# Patient Record
Sex: Female | Born: 1945 | Race: White | Hispanic: No | Marital: Married | Smoking: Never smoker
Health system: Southern US, Community
[De-identification: ages and names within clinical notes are randomized; demographics above are authoritative.]

## PROBLEM LIST (undated history)

## (undated) DIAGNOSIS — I469 Cardiac arrest, cause unspecified: Secondary | ICD-10-CM

## (undated) DIAGNOSIS — Q231 Congenital insufficiency of aortic valve: Secondary | ICD-10-CM

## (undated) DIAGNOSIS — I10 Essential (primary) hypertension: Secondary | ICD-10-CM

## (undated) DIAGNOSIS — R0989 Other specified symptoms and signs involving the circulatory and respiratory systems: Secondary | ICD-10-CM

## (undated) DIAGNOSIS — J189 Pneumonia, unspecified organism: Secondary | ICD-10-CM

## (undated) DIAGNOSIS — F419 Anxiety disorder, unspecified: Secondary | ICD-10-CM

## (undated) DIAGNOSIS — M4800 Spinal stenosis, site unspecified: Secondary | ICD-10-CM

## (undated) DIAGNOSIS — K649 Unspecified hemorrhoids: Secondary | ICD-10-CM

## (undated) DIAGNOSIS — M1990 Unspecified osteoarthritis, unspecified site: Secondary | ICD-10-CM

## (undated) DIAGNOSIS — R0600 Dyspnea, unspecified: Secondary | ICD-10-CM

## (undated) DIAGNOSIS — I712 Thoracic aortic aneurysm, without rupture, unspecified: Secondary | ICD-10-CM

## (undated) DIAGNOSIS — R4189 Other symptoms and signs involving cognitive functions and awareness: Secondary | ICD-10-CM

## (undated) DIAGNOSIS — R4781 Slurred speech: Secondary | ICD-10-CM

## (undated) DIAGNOSIS — M719 Bursopathy, unspecified: Secondary | ICD-10-CM

## (undated) DIAGNOSIS — F0390 Unspecified dementia without behavioral disturbance: Secondary | ICD-10-CM

## (undated) DIAGNOSIS — H269 Unspecified cataract: Secondary | ICD-10-CM

## (undated) DIAGNOSIS — K219 Gastro-esophageal reflux disease without esophagitis: Secondary | ICD-10-CM

## (undated) DIAGNOSIS — Z8719 Personal history of other diseases of the digestive system: Secondary | ICD-10-CM

## (undated) DIAGNOSIS — E785 Hyperlipidemia, unspecified: Secondary | ICD-10-CM

## (undated) DIAGNOSIS — I499 Cardiac arrhythmia, unspecified: Secondary | ICD-10-CM

## (undated) DIAGNOSIS — E079 Disorder of thyroid, unspecified: Secondary | ICD-10-CM

## (undated) DIAGNOSIS — E039 Hypothyroidism, unspecified: Secondary | ICD-10-CM

## (undated) DIAGNOSIS — F341 Dysthymic disorder: Secondary | ICD-10-CM

## (undated) DIAGNOSIS — M419 Scoliosis, unspecified: Secondary | ICD-10-CM

## (undated) HISTORY — PX: HAND SURGERY: SHX662

## (undated) HISTORY — PX: SINUS IRRIGATION: SHX2411

## (undated) HISTORY — PX: FOOT SURGERY: SHX648

## (undated) HISTORY — DX: Slurred speech: R47.81

## (undated) HISTORY — PX: BUNIONECTOMY: SHX129

## (undated) HISTORY — DX: Thoracic aortic aneurysm, without rupture: I71.2

## (undated) HISTORY — DX: Unspecified cataract: H26.9

## (undated) HISTORY — DX: Thoracic aortic aneurysm, without rupture, unspecified: I71.20

## (undated) HISTORY — DX: Other specified symptoms and signs involving the circulatory and respiratory systems: R09.89

## (undated) HISTORY — DX: Spinal stenosis, site unspecified: M48.00

## (undated) HISTORY — DX: Disorder of thyroid, unspecified: E07.9

## (undated) HISTORY — DX: Unspecified osteoarthritis, unspecified site: M19.90

## (undated) HISTORY — PX: ABDOMINAL HYSTERECTOMY: SHX81

## (undated) HISTORY — DX: Dysthymic disorder: F34.1

## (undated) HISTORY — PX: BREAST EXCISIONAL BIOPSY: SUR124

## (undated) HISTORY — DX: Scoliosis, unspecified: M41.9

## (undated) HISTORY — PX: TONSILLECTOMY AND ADENOIDECTOMY: SUR1326

## (undated) HISTORY — PX: OTHER SURGICAL HISTORY: SHX169

## (undated) HISTORY — DX: Hyperlipidemia, unspecified: E78.5

## (undated) HISTORY — DX: Unspecified hemorrhoids: K64.9

## (undated) HISTORY — DX: Personal history of other diseases of the digestive system: Z87.19

## (undated) HISTORY — DX: Congenital insufficiency of aortic valve: Q23.1

## (undated) HISTORY — PX: TRANSTHORACIC ECHOCARDIOGRAM: SHX275

## (undated) HISTORY — DX: Essential (primary) hypertension: I10

---

## 1998-07-20 ENCOUNTER — Other Ambulatory Visit: Admission: RE | Admit: 1998-07-20 | Discharge: 1998-07-20 | Payer: Self-pay | Admitting: Obstetrics and Gynecology

## 1999-08-07 ENCOUNTER — Other Ambulatory Visit: Admission: RE | Admit: 1999-08-07 | Discharge: 1999-08-07 | Payer: Self-pay | Admitting: Obstetrics and Gynecology

## 2000-09-10 ENCOUNTER — Other Ambulatory Visit: Admission: RE | Admit: 2000-09-10 | Discharge: 2000-09-10 | Payer: Self-pay | Admitting: Obstetrics and Gynecology

## 2001-10-07 ENCOUNTER — Other Ambulatory Visit: Admission: RE | Admit: 2001-10-07 | Discharge: 2001-10-07 | Payer: Self-pay | Admitting: Obstetrics and Gynecology

## 2002-12-16 ENCOUNTER — Other Ambulatory Visit: Admission: RE | Admit: 2002-12-16 | Discharge: 2002-12-16 | Payer: Self-pay | Admitting: Obstetrics & Gynecology

## 2003-11-11 ENCOUNTER — Ambulatory Visit (HOSPITAL_COMMUNITY): Admission: RE | Admit: 2003-11-11 | Discharge: 2003-11-11 | Payer: Self-pay | Admitting: Urology

## 2003-11-11 ENCOUNTER — Encounter (INDEPENDENT_AMBULATORY_CARE_PROVIDER_SITE_OTHER): Payer: Self-pay | Admitting: *Deleted

## 2003-11-11 ENCOUNTER — Ambulatory Visit (HOSPITAL_BASED_OUTPATIENT_CLINIC_OR_DEPARTMENT_OTHER): Admission: RE | Admit: 2003-11-11 | Discharge: 2003-11-11 | Payer: Self-pay | Admitting: Urology

## 2004-02-06 ENCOUNTER — Other Ambulatory Visit: Admission: RE | Admit: 2004-02-06 | Discharge: 2004-02-06 | Payer: Self-pay | Admitting: Obstetrics and Gynecology

## 2004-10-03 ENCOUNTER — Ambulatory Visit: Payer: Self-pay | Admitting: Internal Medicine

## 2005-01-10 ENCOUNTER — Ambulatory Visit: Payer: Self-pay | Admitting: Internal Medicine

## 2005-01-21 ENCOUNTER — Ambulatory Visit: Payer: Self-pay | Admitting: Gastroenterology

## 2005-02-04 ENCOUNTER — Ambulatory Visit: Payer: Self-pay | Admitting: Gastroenterology

## 2005-02-04 HISTORY — PX: COLONOSCOPY: SHX174

## 2005-02-21 ENCOUNTER — Ambulatory Visit: Payer: Self-pay | Admitting: Internal Medicine

## 2005-03-01 ENCOUNTER — Other Ambulatory Visit: Admission: RE | Admit: 2005-03-01 | Discharge: 2005-03-01 | Payer: Self-pay | Admitting: Obstetrics and Gynecology

## 2005-04-10 ENCOUNTER — Ambulatory Visit: Payer: Self-pay | Admitting: Family Medicine

## 2005-04-25 ENCOUNTER — Ambulatory Visit: Payer: Self-pay | Admitting: Internal Medicine

## 2005-06-06 ENCOUNTER — Ambulatory Visit: Payer: Self-pay | Admitting: Internal Medicine

## 2005-06-14 ENCOUNTER — Encounter: Admission: RE | Admit: 2005-06-14 | Discharge: 2005-06-14 | Payer: Self-pay | Admitting: Internal Medicine

## 2005-08-06 ENCOUNTER — Ambulatory Visit: Payer: Self-pay | Admitting: Internal Medicine

## 2005-10-15 ENCOUNTER — Encounter: Admission: RE | Admit: 2005-10-15 | Discharge: 2005-10-15 | Payer: Self-pay | Admitting: Neurosurgery

## 2005-10-30 ENCOUNTER — Encounter: Admission: RE | Admit: 2005-10-30 | Discharge: 2006-01-07 | Payer: Self-pay | Admitting: Neurosurgery

## 2005-11-12 ENCOUNTER — Ambulatory Visit: Payer: Self-pay | Admitting: Internal Medicine

## 2005-12-17 ENCOUNTER — Encounter: Admission: RE | Admit: 2005-12-17 | Discharge: 2005-12-17 | Payer: Self-pay | Admitting: Neurosurgery

## 2006-01-10 ENCOUNTER — Encounter: Payer: Self-pay | Admitting: Internal Medicine

## 2006-04-08 ENCOUNTER — Ambulatory Visit: Payer: Self-pay | Admitting: Internal Medicine

## 2006-06-03 ENCOUNTER — Ambulatory Visit: Payer: Self-pay | Admitting: Internal Medicine

## 2006-07-29 ENCOUNTER — Ambulatory Visit: Payer: Self-pay | Admitting: Internal Medicine

## 2006-08-19 ENCOUNTER — Ambulatory Visit: Payer: Self-pay | Admitting: Internal Medicine

## 2006-11-19 ENCOUNTER — Ambulatory Visit: Payer: Self-pay | Admitting: Internal Medicine

## 2006-11-19 LAB — CONVERTED CEMR LAB
ALT: 24 units/L (ref 0–40)
Basophils Absolute: 0 10*3/uL (ref 0.0–0.1)
CO2: 32 meq/L (ref 19–32)
Chloride: 104 meq/L (ref 96–112)
Cholesterol: 220 mg/dL (ref 0–200)
Creatinine, Ser: 1 mg/dL (ref 0.4–1.2)
GFR calc Af Amer: 72 mL/min
Glucose, Bld: 95 mg/dL (ref 70–99)
HDL: 55.9 mg/dL (ref >39.0)
Lymphocytes Relative: 27.4 % (ref 12.0–46.0)
MCHC: 35 g/dL (ref 30.0–36.0)
MCV: 93.2 fL (ref 78.0–100.0)
Monocytes Absolute: 0.6 10*3/uL (ref 0.2–0.7)
Monocytes Relative: 10.7 % (ref 3.0–11.0)
Neutro Abs: 3 10*3/uL (ref 1.4–7.7)
Platelets: 234 10*3/uL (ref 150–400)
RBC: 4.41 M/uL (ref 3.87–5.11)
Sodium: 144 meq/L (ref 135–145)
TSH: 2.33 microintl units/mL (ref 0.35–5.50)
Total CHOL/HDL Ratio: 3.9
Triglycerides: 78 mg/dL (ref 0–149)
VLDL: 16 mg/dL (ref 0–40)

## 2006-11-26 ENCOUNTER — Ambulatory Visit: Payer: Self-pay | Admitting: Internal Medicine

## 2007-01-01 ENCOUNTER — Ambulatory Visit: Payer: Self-pay | Admitting: Internal Medicine

## 2007-02-03 ENCOUNTER — Ambulatory Visit: Payer: Self-pay | Admitting: Internal Medicine

## 2007-03-13 DIAGNOSIS — E039 Hypothyroidism, unspecified: Secondary | ICD-10-CM | POA: Insufficient documentation

## 2007-03-13 DIAGNOSIS — J45909 Unspecified asthma, uncomplicated: Secondary | ICD-10-CM | POA: Insufficient documentation

## 2007-03-13 DIAGNOSIS — M797 Fibromyalgia: Secondary | ICD-10-CM | POA: Insufficient documentation

## 2007-03-17 ENCOUNTER — Ambulatory Visit: Payer: Self-pay | Admitting: Internal Medicine

## 2007-03-17 LAB — CONVERTED CEMR LAB
T3, Free: 3.3 pg/mL (ref 2.3–4.2)
TSH: 0.72 microintl units/mL (ref 0.35–5.50)

## 2007-04-21 ENCOUNTER — Ambulatory Visit: Payer: Self-pay | Admitting: Internal Medicine

## 2007-05-21 ENCOUNTER — Telehealth: Payer: Self-pay | Admitting: *Deleted

## 2007-06-08 ENCOUNTER — Encounter: Payer: Self-pay | Admitting: Internal Medicine

## 2007-07-14 ENCOUNTER — Ambulatory Visit: Payer: Self-pay | Admitting: Internal Medicine

## 2007-08-20 ENCOUNTER — Ambulatory Visit: Payer: Self-pay | Admitting: Internal Medicine

## 2007-09-21 ENCOUNTER — Encounter: Payer: Self-pay | Admitting: Internal Medicine

## 2007-09-29 ENCOUNTER — Telehealth (INDEPENDENT_AMBULATORY_CARE_PROVIDER_SITE_OTHER): Payer: Self-pay | Admitting: *Deleted

## 2007-11-09 ENCOUNTER — Encounter: Payer: Self-pay | Admitting: Internal Medicine

## 2007-11-19 ENCOUNTER — Ambulatory Visit: Payer: Self-pay | Admitting: Internal Medicine

## 2007-11-19 LAB — CONVERTED CEMR LAB
Free T4: 0.5 ng/dL — ABNORMAL LOW (ref 0.6–1.6)
TSH: 1.01 microintl units/mL (ref 0.35–5.50)
Vit D, 1,25-Dihydroxy: 37 (ref 30–89)

## 2007-11-23 ENCOUNTER — Telehealth: Payer: Self-pay | Admitting: Internal Medicine

## 2007-12-17 ENCOUNTER — Encounter: Payer: Self-pay | Admitting: Internal Medicine

## 2007-12-18 ENCOUNTER — Telehealth: Payer: Self-pay | Admitting: *Deleted

## 2007-12-21 ENCOUNTER — Encounter: Payer: Self-pay | Admitting: Internal Medicine

## 2007-12-30 ENCOUNTER — Telehealth: Payer: Self-pay | Admitting: Internal Medicine

## 2008-01-01 ENCOUNTER — Telehealth: Payer: Self-pay | Admitting: Internal Medicine

## 2008-01-19 ENCOUNTER — Ambulatory Visit: Payer: Self-pay | Admitting: Internal Medicine

## 2008-01-19 DIAGNOSIS — M5030 Other cervical disc degeneration, unspecified cervical region: Secondary | ICD-10-CM | POA: Insufficient documentation

## 2008-01-19 DIAGNOSIS — M1990 Unspecified osteoarthritis, unspecified site: Secondary | ICD-10-CM | POA: Insufficient documentation

## 2008-01-19 DIAGNOSIS — F324 Major depressive disorder, single episode, in partial remission: Secondary | ICD-10-CM | POA: Insufficient documentation

## 2008-01-19 DIAGNOSIS — G43109 Migraine with aura, not intractable, without status migrainosus: Secondary | ICD-10-CM | POA: Insufficient documentation

## 2008-01-21 ENCOUNTER — Telehealth: Payer: Self-pay | Admitting: *Deleted

## 2008-01-23 ENCOUNTER — Encounter: Admission: RE | Admit: 2008-01-23 | Discharge: 2008-01-23 | Payer: Self-pay | Admitting: Internal Medicine

## 2008-02-03 ENCOUNTER — Telehealth (INDEPENDENT_AMBULATORY_CARE_PROVIDER_SITE_OTHER): Payer: Self-pay | Admitting: *Deleted

## 2008-02-16 ENCOUNTER — Encounter: Payer: Self-pay | Admitting: Internal Medicine

## 2008-02-19 ENCOUNTER — Telehealth (INDEPENDENT_AMBULATORY_CARE_PROVIDER_SITE_OTHER): Payer: Self-pay | Admitting: *Deleted

## 2008-03-18 ENCOUNTER — Ambulatory Visit: Payer: Self-pay | Admitting: Internal Medicine

## 2008-04-21 ENCOUNTER — Ambulatory Visit: Payer: Self-pay | Admitting: Internal Medicine

## 2008-04-21 DIAGNOSIS — R5382 Chronic fatigue, unspecified: Secondary | ICD-10-CM | POA: Insufficient documentation

## 2008-04-21 DIAGNOSIS — G9332 Myalgic encephalomyelitis/chronic fatigue syndrome: Secondary | ICD-10-CM | POA: Insufficient documentation

## 2008-05-03 ENCOUNTER — Ambulatory Visit (HOSPITAL_COMMUNITY): Admission: RE | Admit: 2008-05-03 | Discharge: 2008-05-03 | Payer: Self-pay | Admitting: Neurosurgery

## 2008-05-04 ENCOUNTER — Encounter: Payer: Self-pay | Admitting: Internal Medicine

## 2008-05-26 ENCOUNTER — Ambulatory Visit: Payer: Self-pay | Admitting: Internal Medicine

## 2008-05-27 ENCOUNTER — Encounter: Payer: Self-pay | Admitting: Internal Medicine

## 2008-06-07 ENCOUNTER — Telehealth: Payer: Self-pay | Admitting: *Deleted

## 2008-06-14 ENCOUNTER — Telehealth: Payer: Self-pay | Admitting: Internal Medicine

## 2008-06-23 ENCOUNTER — Ambulatory Visit: Payer: Self-pay | Admitting: Internal Medicine

## 2008-06-27 ENCOUNTER — Encounter: Payer: Self-pay | Admitting: Internal Medicine

## 2008-07-29 ENCOUNTER — Ambulatory Visit: Payer: Self-pay | Admitting: Internal Medicine

## 2008-07-29 DIAGNOSIS — Q742 Other congenital malformations of lower limb(s), including pelvic girdle: Secondary | ICD-10-CM | POA: Insufficient documentation

## 2008-07-29 LAB — CONVERTED CEMR LAB
Calcium: 8.9 mg/dL (ref 8.4–10.5)
Free T4: 1 ng/dL (ref 0.6–1.6)
T3, Free: 7 pg/mL — ABNORMAL HIGH (ref 2.3–4.2)
TSH: 0.1 microintl units/mL — ABNORMAL LOW (ref 0.35–5.50)

## 2008-08-02 ENCOUNTER — Ambulatory Visit (HOSPITAL_BASED_OUTPATIENT_CLINIC_OR_DEPARTMENT_OTHER): Admission: RE | Admit: 2008-08-02 | Discharge: 2008-08-03 | Payer: Self-pay | Admitting: Orthopedic Surgery

## 2008-08-15 ENCOUNTER — Telehealth (INDEPENDENT_AMBULATORY_CARE_PROVIDER_SITE_OTHER): Payer: Self-pay | Admitting: *Deleted

## 2008-09-05 ENCOUNTER — Encounter: Payer: Self-pay | Admitting: Internal Medicine

## 2008-09-15 ENCOUNTER — Telehealth: Payer: Self-pay | Admitting: Internal Medicine

## 2008-10-07 ENCOUNTER — Ambulatory Visit: Payer: Self-pay | Admitting: Internal Medicine

## 2008-11-14 ENCOUNTER — Telehealth: Payer: Self-pay | Admitting: Internal Medicine

## 2008-11-16 ENCOUNTER — Telehealth: Payer: Self-pay | Admitting: Internal Medicine

## 2008-11-18 ENCOUNTER — Ambulatory Visit: Payer: Self-pay | Admitting: Internal Medicine

## 2008-11-19 ENCOUNTER — Telehealth: Payer: Self-pay | Admitting: Family Medicine

## 2008-11-28 ENCOUNTER — Ambulatory Visit: Payer: Self-pay | Admitting: Internal Medicine

## 2008-11-28 DIAGNOSIS — T887XXA Unspecified adverse effect of drug or medicament, initial encounter: Secondary | ICD-10-CM | POA: Insufficient documentation

## 2008-11-28 LAB — CONVERTED CEMR LAB
Basophils Absolute: 0.1 10*3/uL (ref 0.0–0.1)
CO2: 28 meq/L (ref 19–32)
Calcium: 9.3 mg/dL (ref 8.4–10.5)
Chloride: 101 meq/L (ref 96–112)
Glucose, Bld: 91 mg/dL (ref 70–99)
Hemoglobin: 11.4 g/dL — ABNORMAL LOW (ref 12.0–15.0)
Lymphocytes Relative: 7.3 % — ABNORMAL LOW (ref 12.0–46.0)
MCHC: 33.5 g/dL (ref 30.0–36.0)
Monocytes Relative: 3 % (ref 3.0–12.0)
Neutro Abs: 12.1 10*3/uL — ABNORMAL HIGH (ref 1.4–7.7)
Neutrophils Relative %: 88.6 % — ABNORMAL HIGH (ref 43.0–77.0)
Potassium: 4.2 meq/L (ref 3.5–5.1)
RDW: 12.7 % (ref 11.5–14.6)
Sodium: 134 meq/L — ABNORMAL LOW (ref 135–145)

## 2008-11-29 ENCOUNTER — Ambulatory Visit: Payer: Self-pay | Admitting: Cardiology

## 2008-12-16 ENCOUNTER — Telehealth: Payer: Self-pay | Admitting: Internal Medicine

## 2008-12-17 ENCOUNTER — Ambulatory Visit: Payer: Self-pay | Admitting: Internal Medicine

## 2008-12-17 DIAGNOSIS — R079 Chest pain, unspecified: Secondary | ICD-10-CM | POA: Insufficient documentation

## 2008-12-19 ENCOUNTER — Ambulatory Visit: Payer: Self-pay | Admitting: Internal Medicine

## 2008-12-20 ENCOUNTER — Telehealth: Payer: Self-pay | Admitting: Internal Medicine

## 2008-12-27 ENCOUNTER — Ambulatory Visit: Payer: Self-pay | Admitting: Pulmonary Disease

## 2008-12-27 DIAGNOSIS — I77810 Thoracic aortic ectasia: Secondary | ICD-10-CM | POA: Insufficient documentation

## 2008-12-27 DIAGNOSIS — G4710 Hypersomnia, unspecified: Secondary | ICD-10-CM | POA: Insufficient documentation

## 2009-01-16 ENCOUNTER — Telehealth: Payer: Self-pay | Admitting: Internal Medicine

## 2009-02-01 ENCOUNTER — Ambulatory Visit: Payer: Self-pay | Admitting: Pulmonary Disease

## 2009-02-01 ENCOUNTER — Encounter: Payer: Self-pay | Admitting: Pulmonary Disease

## 2009-02-01 DIAGNOSIS — J309 Allergic rhinitis, unspecified: Secondary | ICD-10-CM | POA: Insufficient documentation

## 2009-02-02 ENCOUNTER — Encounter: Payer: Self-pay | Admitting: Pulmonary Disease

## 2009-02-02 ENCOUNTER — Telehealth (INDEPENDENT_AMBULATORY_CARE_PROVIDER_SITE_OTHER): Payer: Self-pay | Admitting: *Deleted

## 2009-02-20 ENCOUNTER — Ambulatory Visit: Payer: Self-pay | Admitting: Internal Medicine

## 2009-04-12 ENCOUNTER — Telehealth: Payer: Self-pay | Admitting: Internal Medicine

## 2009-04-18 ENCOUNTER — Ambulatory Visit: Payer: Self-pay | Admitting: Pulmonary Disease

## 2009-04-25 ENCOUNTER — Ambulatory Visit: Payer: Self-pay | Admitting: Internal Medicine

## 2009-04-25 DIAGNOSIS — D509 Iron deficiency anemia, unspecified: Secondary | ICD-10-CM | POA: Insufficient documentation

## 2009-04-25 LAB — CONVERTED CEMR LAB: Vit D, 25-Hydroxy: 50 ng/mL (ref 30–89)

## 2009-04-26 LAB — CONVERTED CEMR LAB
Basophils Relative: 0.6 % (ref 0.0–3.0)
Eosinophils Absolute: 0.1 10*3/uL (ref 0.0–0.7)
Eosinophils Relative: 2.4 % (ref 0.0–5.0)
Free T4: 0.7 ng/dL (ref 0.6–1.6)
Hemoglobin: 11.8 g/dL — ABNORMAL LOW (ref 12.0–15.0)
Lymphocytes Relative: 29.6 % (ref 12.0–46.0)
MCHC: 33.9 g/dL (ref 30.0–36.0)
Monocytes Relative: 8.5 % (ref 3.0–12.0)
Neutro Abs: 3.1 10*3/uL (ref 1.4–7.7)
Neutrophils Relative %: 58.9 % (ref 43.0–77.0)
RBC: 3.81 M/uL — ABNORMAL LOW (ref 3.87–5.11)
Saturation Ratios: 7.9 % — ABNORMAL LOW (ref 20.0–50.0)
T3, Free: 3.7 pg/mL (ref 2.3–4.2)
TSH: 1.33 microintl units/mL (ref 0.35–5.50)
Transferrin: 324.7 mg/dL (ref 212.0–360.0)
Vitamin B-12: >1500 pg/mL — ABNORMAL HIGH (ref 211–911)
WBC: 5.1 10*3/uL (ref 4.5–10.5)

## 2009-06-12 ENCOUNTER — Encounter: Payer: Self-pay | Admitting: Internal Medicine

## 2009-06-27 ENCOUNTER — Ambulatory Visit: Payer: Self-pay | Admitting: Internal Medicine

## 2009-07-11 ENCOUNTER — Ambulatory Visit: Payer: Self-pay | Admitting: Pulmonary Disease

## 2009-07-11 LAB — CONVERTED CEMR LAB
CO2: 29 meq/L (ref 19–32)
Calcium: 9.6 mg/dL (ref 8.4–10.5)
Creatinine, Ser: 0.7 mg/dL (ref 0.4–1.2)
GFR calc non Af Amer: 89.38 mL/min (ref >60)
Sodium: 136 meq/L (ref 135–145)

## 2009-07-13 ENCOUNTER — Ambulatory Visit: Payer: Self-pay | Admitting: Cardiology

## 2009-07-17 ENCOUNTER — Ambulatory Visit: Payer: Self-pay | Admitting: Thoracic Surgery (Cardiothoracic Vascular Surgery)

## 2009-07-17 ENCOUNTER — Encounter: Payer: Self-pay | Admitting: Internal Medicine

## 2009-07-31 ENCOUNTER — Ambulatory Visit: Payer: Self-pay | Admitting: Internal Medicine

## 2009-07-31 LAB — CONVERTED CEMR LAB
Basophils Absolute: 0 10*3/uL (ref 0.0–0.1)
Basophils Relative: 0.6 % (ref 0.0–3.0)
Eosinophils Absolute: 0.1 10*3/uL (ref 0.0–0.7)
Folate: 8.8 ng/mL
Iron: 91 ug/dL (ref 42–145)
Lymphocytes Relative: 24.4 % (ref 12.0–46.0)
MCHC: 35 g/dL (ref 30.0–36.0)
Monocytes Relative: 11.9 % (ref 3.0–12.0)
Neutrophils Relative %: 61.4 % (ref 43.0–77.0)
RBC: 4.16 M/uL (ref 3.87–5.11)
RDW: 12.7 % (ref 11.5–14.6)
Transferrin: 295.7 mg/dL (ref 212.0–360.0)

## 2009-08-07 ENCOUNTER — Ambulatory Visit: Payer: Self-pay | Admitting: Internal Medicine

## 2009-08-07 DIAGNOSIS — R064 Hyperventilation: Secondary | ICD-10-CM | POA: Insufficient documentation

## 2009-08-08 ENCOUNTER — Telehealth: Payer: Self-pay | Admitting: Internal Medicine

## 2009-08-15 ENCOUNTER — Telehealth: Payer: Self-pay | Admitting: Internal Medicine

## 2009-08-25 ENCOUNTER — Telehealth: Payer: Self-pay | Admitting: Internal Medicine

## 2009-08-28 ENCOUNTER — Ambulatory Visit: Payer: Self-pay | Admitting: Internal Medicine

## 2009-09-20 ENCOUNTER — Ambulatory Visit: Payer: Self-pay | Admitting: Internal Medicine

## 2009-09-20 DIAGNOSIS — I498 Other specified cardiac arrhythmias: Secondary | ICD-10-CM | POA: Insufficient documentation

## 2009-10-13 ENCOUNTER — Ambulatory Visit: Payer: Self-pay | Admitting: Internal Medicine

## 2009-11-13 ENCOUNTER — Ambulatory Visit: Payer: Self-pay | Admitting: Internal Medicine

## 2009-11-13 LAB — CONVERTED CEMR LAB
Basophils Relative: 0.5 % (ref 0.0–3.0)
Eosinophils Relative: 2.4 % (ref 0.0–5.0)
HCT: 38.7 % (ref 36.0–46.0)
Hemoglobin: 12.9 g/dL (ref 12.0–15.0)
Lymphs Abs: 1.5 10*3/uL (ref 0.7–4.0)
Monocytes Relative: 8.1 % (ref 3.0–12.0)
Neutro Abs: 4.1 10*3/uL (ref 1.4–7.7)
RBC: 3.94 M/uL (ref 3.87–5.11)
Saturation Ratios: 15.6 % — ABNORMAL LOW (ref 20.0–50.0)
TSH: 1.04 microintl units/mL (ref 0.35–5.50)
WBC: 6.2 10*3/uL (ref 4.5–10.5)

## 2010-01-16 ENCOUNTER — Ambulatory Visit: Payer: Self-pay | Admitting: Internal Medicine

## 2010-01-30 ENCOUNTER — Encounter: Payer: Self-pay | Admitting: Pulmonary Disease

## 2010-01-30 ENCOUNTER — Encounter
Admission: RE | Admit: 2010-01-30 | Discharge: 2010-01-30 | Payer: Self-pay | Admitting: Thoracic Surgery (Cardiothoracic Vascular Surgery)

## 2010-01-30 ENCOUNTER — Ambulatory Visit: Payer: Self-pay | Admitting: Thoracic Surgery (Cardiothoracic Vascular Surgery)

## 2010-01-30 ENCOUNTER — Encounter: Payer: Self-pay | Admitting: Internal Medicine

## 2010-02-06 ENCOUNTER — Telehealth: Payer: Self-pay | Admitting: Internal Medicine

## 2010-02-22 ENCOUNTER — Encounter: Payer: Self-pay | Admitting: Internal Medicine

## 2010-03-20 ENCOUNTER — Ambulatory Visit: Payer: Self-pay | Admitting: Internal Medicine

## 2010-03-20 LAB — CONVERTED CEMR LAB
Basophils Relative: 0.5 % (ref 0.0–3.0)
Eosinophils Relative: 1.1 % (ref 0.0–5.0)
HCT: 38.3 % (ref 36.0–46.0)
Hemoglobin: 13.3 g/dL (ref 12.0–15.0)
MCV: 94.2 fL (ref 78.0–100.0)
Monocytes Absolute: 0.5 10*3/uL (ref 0.1–1.0)
Neutrophils Relative %: 67.5 % (ref 43.0–77.0)
RBC: 4.07 M/uL (ref 3.87–5.11)
Transferrin: 301 mg/dL (ref 212.0–360.0)
WBC: 6.2 10*3/uL (ref 4.5–10.5)

## 2010-04-25 ENCOUNTER — Telehealth: Payer: Self-pay | Admitting: Internal Medicine

## 2010-04-26 ENCOUNTER — Encounter: Payer: Self-pay | Admitting: Internal Medicine

## 2010-06-19 ENCOUNTER — Encounter: Payer: Self-pay | Admitting: Internal Medicine

## 2010-07-11 ENCOUNTER — Telehealth: Payer: Self-pay | Admitting: Internal Medicine

## 2010-07-13 ENCOUNTER — Telehealth: Payer: Self-pay | Admitting: Internal Medicine

## 2010-07-25 ENCOUNTER — Ambulatory Visit: Payer: Self-pay | Admitting: Internal Medicine

## 2010-07-25 DIAGNOSIS — E559 Vitamin D deficiency, unspecified: Secondary | ICD-10-CM | POA: Insufficient documentation

## 2010-07-25 LAB — CONVERTED CEMR LAB
Basophils Absolute: 0 10*3/uL (ref 0.0–0.1)
Eosinophils Absolute: 0.1 10*3/uL (ref 0.0–0.7)
Eosinophils Relative: 2.1 % (ref 0.0–5.0)
Iron: 70 ug/dL (ref 42–145)
MCHC: 34.6 g/dL (ref 30.0–36.0)
MCV: 94.8 fL (ref 78.0–100.0)
Monocytes Absolute: 0.5 10*3/uL (ref 0.1–1.0)
Neutrophils Relative %: 65.7 % (ref 43.0–77.0)
Platelets: 233 10*3/uL (ref 150.0–400.0)
TSH: 0.79 microintl units/mL (ref 0.35–5.50)
WBC: 6.5 10*3/uL (ref 4.5–10.5)

## 2010-09-27 ENCOUNTER — Telehealth: Payer: Self-pay | Admitting: Internal Medicine

## 2010-10-03 ENCOUNTER — Emergency Department (HOSPITAL_BASED_OUTPATIENT_CLINIC_OR_DEPARTMENT_OTHER)
Admission: EM | Admit: 2010-10-03 | Discharge: 2010-10-03 | Payer: Self-pay | Source: Home / Self Care | Admitting: Emergency Medicine

## 2010-10-08 ENCOUNTER — Ambulatory Visit
Admission: RE | Admit: 2010-10-08 | Discharge: 2010-10-08 | Payer: Self-pay | Source: Home / Self Care | Attending: Orthopedic Surgery | Admitting: Orthopedic Surgery

## 2010-10-17 ENCOUNTER — Ambulatory Visit: Payer: Self-pay | Admitting: Internal Medicine

## 2010-10-17 LAB — CONVERTED CEMR LAB
HDL: 62.2 mg/dL (ref >39.00)
TSH: 0.79 microintl units/mL (ref 0.35–5.50)

## 2010-11-02 ENCOUNTER — Encounter
Admission: RE | Admit: 2010-11-02 | Discharge: 2010-11-27 | Payer: Self-pay | Source: Home / Self Care | Attending: Orthopedic Surgery | Admitting: Orthopedic Surgery

## 2010-11-17 ENCOUNTER — Encounter: Payer: Self-pay | Admitting: Internal Medicine

## 2010-11-27 NOTE — Assessment & Plan Note (Signed)
Summary: 2 MONTH ROA//LH   Vital Signs:  Patient profile:   65 year old female Height:      64 inches Weight:      136 pounds BMI:     23.43 Temp:     98.2 degrees F oral Pulse rate:   68 / minute Resp:     12 per minute BP sitting:   130 / 78  (left arm)  Vitals Entered By: Allyne Gee, LPN (January 17, 9232 0:07 PM) CC: roa   Primary Care Provider:  Ricard Dillon MD  CC:  roa.  History of Present Illness: increased fatigue and loss of focus and motivaton   Follow-Up Visit      This is a 65 year old woman who presents for Follow-up visit.  The patient denies chest pain, palpitations, dizziness, syncope, low blood sugar symptoms, high blood sugar symptoms, edema, SOB, DOE, PND, and orthopnea.  Since the last visit the patient notes no new problems or concerns.  The patient reports taking meds as prescribed.  When questioned about possible medication side effects, the patient notes none.    Preventive Screening-Counseling & Management  Alcohol-Tobacco     Smoking Status: never     Passive Smoke Exposure: no  Problems Prior to Update: 1)  Acute Maxillary Sinusitis  (ICD-461.0) 2)  Palpitations, Recurrent  (ICD-785.1) 3)  Acute Frontal Sinusitis  (ICD-461.1) 4)  Hyperventilation  (ICD-786.01) 5)  Anemia, Iron Deficiency  (ICD-280.9) 6)  Asthma, With Acute Exacerbation  (ICD-493.92) 7)  Allergic Rhinitis  (ICD-477.9) 8)  Thoracic Aortic Aneurysm  (ICD-441.2) 9)  Hypersomnia  (ICD-780.54) 10)  Chest Pain, Left  (ICD-786.50) 11)  Uns Advrs Eff Uns Rx Medicinal&biological Sbstnc  (ICD-995.20) 12)  Pneumonia, Left Lower Lobe  (ICD-481) 13)  Hammer Toe  (ICD-755.66) 14)  Chronic Fatigue Syndrome  (ICD-780.71) 15)  Adj Disorder With Mixed Anxiety & Depressed Mood  (ICD-309.28) 16)  Disc Disease, 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) 17)  Migraine, Classical  (ICD-346.00) 18)  Osteoarthritis  (ICD-715.90) 19)  Fibromyalgia  (ICD-729.1) 20)  Asthma  (ICD-493.90) 21)  Hypothyroidism   (ICD-244.9)  Current Problems (verified): 1)  Acute Maxillary Sinusitis  (ICD-461.0) 2)  Palpitations, Recurrent  (ICD-785.1) 3)  Acute Frontal Sinusitis  (ICD-461.1) 4)  Hyperventilation  (ICD-786.01) 5)  Anemia, Iron Deficiency  (ICD-280.9) 6)  Asthma, With Acute Exacerbation  (ICD-493.92) 7)  Allergic Rhinitis  (ICD-477.9) 8)  Thoracic Aortic Aneurysm  (ICD-441.2) 9)  Hypersomnia  (ICD-780.54) 10)  Chest Pain, Left  (ICD-786.50) 11)  Uns Advrs Eff Uns Rx Medicinal&biological Sbstnc  (ICD-995.20) 12)  Pneumonia, Left Lower Lobe  (ICD-481) 13)  Hammer Toe  (ICD-755.66) 14)  Chronic Fatigue Syndrome  (ICD-780.71) 15)  Adj Disorder With Mixed Anxiety & Depressed Mood  (ICD-309.28) 16)  Disc Disease, 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 One Puff  Two Times A Day 2)  Singulair 10 Mg  Tabs (Montelukast Sodium) .... Once Daily 3)  Proair Hfa 108 (90 Base) Mcg/act Aers (Albuterol Sulfate) .... 2 Puffs Every 4-6 Hours As Needed 4)  Nasonex 50 Mcg/act Susp (Mometasone Furoate) .... Two Sprays Each Nostril Once Daily As Needed 5)  Lunesta 3 Mg  Tabs (Eszopiclone) .... At Bedtime 6)  Fluoxetine Hcl 40 Mg  Caps (Fluoxetine  Hcl) .... 1 Once Daily 7)  Vivelle-Dot 0.0375 Mg/24hr  Pttw (Estradiol) .... Unsure of Dosage/change  2times A Week 8)  Prodrin 009-233-00 Mg Tabs (Apap-Isometheptene-Caffeine) .... One By Mouth Q 6 Hours As Needed Ha 9)  Vitamin D 50000 Unit  Caps (Ergocalciferol) .[name] Kitchen.. 1 Twice A Week 10)  Demerol 50 Mg  Tabs (Meperidine Hcl) .... One By Mouth Q 8 Hrs As Needed For Pain 11)  Carisoprodol 350 Mg  Tabs (Carisoprodol) .[name] Kitchen.. 1 Two Times A Day As Needed 12)  Verapamil Hcl Cr 180 Mg Cr-Tabs (Verapamil Hcl) .... One By Mouth Daily 13)   Cyanocobalamin 1000 Mcg/ml Soln (Cyanocobalamin) .[name] Kitchen.. 1 Ml Twice A Week 14)  Armour Thyroid 60 Mg Tabs (Thyroid) .[name] Kitchen.. 1&1/2  Once Daily(Total 90) 15)  Promethazine Hcl 50 Mg Tabs (Promethazine Hcl) .[name] Kitchen.. 1 Every 8 Hour As Needed Nausea 16)  Adderall Xr 10 Mg Xr24h-Cap (Amphetamine-Dextroamphetamine) .... As Needed 17)  Slow Release Iron 47.5 Mg  Cr-Tabs (Ferrous Sulfate) .... One By Mouth Bid 18)  Smz-Tmp Ds 800-160 Mg Tabs (Sulfamethoxazole-Trimethoprim) .... One By Mouth Bid  Current Medications (verified): 1)  Dulera 100-5 Mcg/act Aero (Mometasone Furo-Formoterol Fum) .... One Puff  Two Times A Day 2)  Singulair 10 Mg  Tabs (Montelukast Sodium) .... Once Daily 3)  Proair Hfa 108 (90 Base) Mcg/act Aers (Albuterol Sulfate) .... 2 Puffs Every 4-6 Hours As Needed 4)  Nasonex 50 Mcg/act Susp (Mometasone Furoate) .... Two Sprays Each Nostril Once Daily As Needed 5)  Lunesta 3 Mg  Tabs (Eszopiclone) .... At Bedtime 6)  Fluoxetine Hcl 40 Mg  Caps (Fluoxetine Hcl) .[name] Kitchen.. 1 Once Daily 7)  Vivelle-Dot 0.0375 Mg/24hr  Pttw (Estradiol) .... Unsure of Dosage/change  2times A Week 8)  Vitamin D 50000 Unit  Caps (Ergocalciferol) .[name] Kitchen.. 1 Twice A Week 9)  Demerol 50 Mg  Tabs (Meperidine Hcl) .... One By Mouth Q 8 Hrs As Needed For Pain 10)  Carisoprodol 350 Mg  Tabs (Carisoprodol) .[name] Kitchen.. 1 Two Times A Day As Needed 11)  Verapamil Hcl Cr 180 Mg Cr-Tabs (Verapamil Hcl) .... One By Mouth Daily 12)  Cyanocobalamin 1000 Mcg/ml Soln (Cyanocobalamin) .[name] Kitchen.. 1 Ml Twice A Week 13)  Armour Thyroid 60 Mg Tabs (Thyroid) .[name] Kitchen.. 1&1/2  Once Daily(Total 90) 14)  Promethazine Hcl 50 Mg Tabs (Promethazine Hcl) .[name] Kitchen.. 1 Every 8 Hour As Needed Nausea 15)  Slow Release Iron 47.5 Mg  Cr-Tabs (Ferrous Sulfate) .... One By Mouth Bid 16)  Smz-Tmp Ds 800-160 Mg Tabs (Sulfamethoxazole-Trimethoprim) .... One By Mouth Bid 17)  Ritalin La 10 Mg Xr24h-Cap (Methylphenidate Hcl) .... One By Mouth Daily  Allergies (verified): 1)  ! Premarin 2)  !  Feldene 3)  ! Codeine 4)  ! Erythromycin 5)  ! Duragesic-25 (Fentanyl)  Past History:  Family History: Last updated: 12/27/2008 father... leukemia at 3 mother... Family History of Arthritis  Social History: Last updated: 12/27/2008 Married Alcohol use-yes Drug use-no Regular exercise-no  Risk Factors: Exercise: no (07/14/2007)  Risk Factors: Smoking Status: never (01/16/2010) Passive Smoke Exposure: no (01/16/2010)  Past medical, surgical, family and social histories (including risk factors) reviewed, and no changes noted (except as noted below).  Past Medical History: Reviewed history from 04/18/2009 and no changes required. Hypothyroidism Asthma      - PFT 02/01/09 FEV1 2.71 (125%), FVC 3.47 (117%), FEV1% 78, TLC 5.29 (109%), DLCO 98%, +BD Chronic fatigue syndrom Mitral valve prolapse  Migraine Headaches Interstitial cystitis Dysthymia Osteoarthritis 4cm ascending aortic dilation      -  From CT chest 11/29/08    Past Surgical History: Reviewed history from 02/01/2009 and no changes required. Hysterectomy Tonsillectomy Sinus surgery Hand surgery  Foot surgery Dr. Eleanora Neighbor 2009/2010  Family History: Reviewed history from 12/27/2008 and no changes required. father... leukemia at 59 mother... Family History of Arthritis  Social History: Reviewed history from 12/27/2008 and no changes required. Married Alcohol use-yes Drug use-no Regular exercise-no  Review of Systems  The patient denies anorexia, fever, weight loss, weight gain, vision loss, decreased hearing, hoarseness, chest pain, syncope, dyspnea on exertion, peripheral edema, prolonged cough, headaches, hemoptysis, abdominal pain, melena, hematochezia, severe indigestion/heartburn, hematuria, incontinence, genital sores, muscle weakness, suspicious skin lesions, transient blindness, difficulty walking, depression, unusual weight change, abnormal bleeding, enlarged lymph nodes, angioedema, and  breast masses.    Physical Exam  General:  normal appearance and healthy appearing.   Head:  normocephalic and atraumatic.   Eyes:  PERRLA and EOMI.   Ears:  TMs intact and clear with normal canals Nose:  clear drainage, no tenderness Neck:  no JVD.   Lungs:  coarse breath sounds, diminished air entry, no wheeze Heart:  normal rate, no murmur, and no gallop.   Abdomen:  Bowel sounds positive,abdomen soft and non-tender without masses, organomegaly or hernias noted. Msk:  no joint warmth, joint tenderness, and joint swelling.   Pulses:  R and L carotid,radial,femoral,dorsalis pedis and posterior tibial pulses are full and equal bilaterally Extremities:  No clubbing, cyanosis, edema, or deformity noted with normal full range of motion of all joints.     Impression & Recommendations:  Problem # 1:  OSTEOARTHRITIS (ICD-715.90)  shoulders and distal IPJ  Her updated medication list for this problem includes:    Demerol 50 Mg Tabs (Meperidine hcl) ..... One by mouth q 8 hrs as needed for pain  Discussed use of medications, application of heat or cold, and exercises.   Problem # 2:  OSTEOARTHRITIS, GENERALIZED, HAND (ICD-715.04) injecting two joints on the right hand ( distal) Informed consent obtained and then the joints was prepped in a sterile manor and 40 mg depo and 1/2 cc 1% lidocaine injected into the synovial space. After care discussed. Pt tolerated procedure well.  Her updated medication list for this problem includes:    Demerol 50 Mg Tabs (Meperidine hcl) ..... One by mouth q 8 hrs as needed for pain  Discussed use of medications, application of heat or cold, and exercises.   Orders: Depo-Medrol 60m (J1020) Joint Aspirate / Injection, Small (20600)  Problem # 3:  ANEMIA, IRON DEFICIENCY (ICD-280.9) stable Her updated medication list for this problem includes:    Cyanocobalamin 1000 Mcg/ml Soln (Cyanocobalamin) ..[name] Kitchen.. 1 ml twice a week    Slow Release Iron 47.5 Mg  Cr-tabs (Ferrous sulfate) ..... One by mouth bid  Hgb: 12.9 (11/13/2009)   Hct: 38.7 (11/13/2009)   Platelets: 225.0 (11/13/2009) RBC: 3.94 (11/13/2009)   RDW: 12.1 (11/13/2009)   WBC: 6.2 (11/13/2009) MCV: 98.2 (11/13/2009)   MCHC: 33.3 (11/13/2009) Iron: 66 (11/13/2009)   % Sat: 15.6 (11/13/2009) B12: 947 (11/13/2009)   Folate: 18.2 (11/13/2009)   TSH: 1.04 (11/13/2009)  Problem # 4:  FIBROMYALGIA (ICD-729.1) add ritalin 10 xr Her updated medication list for this problem includes:    Demerol 50 Mg Tabs (Meperidine hcl) ..... One by mouth q 8 hrs as needed for pain    Carisoprodol 350 Mg Tabs (Carisoprodol) ..[name] Kitchen.. 1 two times a day as needed  Complete Medication List: 1)  Dulera 100-5 Mcg/act Aero (  Mometasone furo-formoterol fum) .... One puff  two times a day 2)  Singulair 10 Mg Tabs (Montelukast sodium) .... Once daily 3)  Proair Hfa 108 (90 Base) Mcg/act Aers (Albuterol sulfate) .... 2 puffs every 4-6 hours as needed 4)  Nasonex 50 Mcg/act Susp (Mometasone furoate) .... Two sprays each nostril once daily as needed 5)  Lunesta 3 Mg Tabs (Eszopiclone) .... At bedtime 6)  Fluoxetine Hcl 40 Mg Caps (Fluoxetine hcl) .[name] Kitchen.. 1 once daily 7)  Vivelle-dot 0.0375 Mg/24hr Pttw (Estradiol) .... Unsure of dosage/change  2times a week 8)  Vitamin D 50000 Unit Caps (Ergocalciferol) .[name] Kitchen.. 1 twice a week 9)  Demerol 50 Mg Tabs (Meperidine hcl) .... One by mouth q 8 hrs as needed for pain 10)  Carisoprodol 350 Mg Tabs (Carisoprodol) .[name] Kitchen.. 1 two times a day as needed 11)  Verapamil Hcl Cr 180 Mg Cr-tabs (Verapamil hcl) .... One by mouth daily 12)  Cyanocobalamin 1000 Mcg/ml Soln (Cyanocobalamin) .[name] Kitchen.. 1 ml twice a week 13)  Armour Thyroid 60 Mg Tabs (Thyroid) .[name] Kitchen.. 1&1/2  once daily(total 90) 14)  Promethazine Hcl 50 Mg Tabs (Promethazine hcl) .[name] Kitchen.. 1 every 8 hour as needed nausea 15)  Slow Release Iron 47.5 Mg Cr-tabs (Ferrous sulfate) .... One by mouth bid 16)  Smz-tmp Ds 800-160 Mg Tabs  (Sulfamethoxazole-trimethoprim) .... One by mouth bid 17)  Ritalin La 10 Mg Xr24h-cap (Methylphenidate hcl) .... One by mouth daily  Patient Instructions: 1)  Please schedule a follow-up appointment in 2 months. Prescriptions: RITALIN LA 10 MG XR24H-CAP (METHYLPHENIDATE HCL) one by mouth daily  #30 x 0   Entered and Authorized by:   Ricard Dillon MD   Signed by:   Ricard Dillon MD on 01/16/2010   Method used:   Print then Give to Patient   RxID:   [id number] RITALIN LA 10 MG XR24H-CAP (METHYLPHENIDATE HCL) one by mouth daily  #30 x 0   Entered and Authorized by:   Ricard Dillon MD   Signed by:   Ricard Dillon MD on 01/16/2010   Method used:   Print then Give to Patient   RxID:   [id number] DEMEROL 50 MG  TABS (MEPERIDINE HCL) one by mouth q 8 hrs as needed for pain  #90 x 0   Entered by:   Allyne Gee, LPN   Authorized by:   Ricard Dillon MD   Signed by:   Allyne Gee, LPN on 93/23/5573   Method used:   Print then Give to Patient   RxID:   [id number] LUNESTA 3 MG  TABS (ESZOPICLONE) at bedtime  #30 x 5   Entered by:   Allyne Gee, LPN   Authorized by:   Ricard Dillon MD   Signed by:   Allyne Gee, LPN on 31/51/7616   Method used:   Print then Give to Patient   RxID:   [id number] CARISOPRODOL 350 MG  TABS (CARISOPRODOL) 1 two times a day as needed  #60 Tablet x 2   Entered by:   Allyne Gee, LPN   Authorized by:   Ricard Dillon MD   Signed by:   Allyne Gee, LPN on 70/35/0093   Method used:   Electronically to        [facility] [phone]* (retail)       [address]       [place], [place]  [address]  Ph: [phone]       Fax: [phone]   RxID:   [id number]

## 2010-11-27 NOTE — Progress Notes (Signed)
Summary: Rx Request  Phone Note Call from Patient Call back at Home Phone [phone]   Caller: Patient Summary of Call: Having a lot of pain in neck and shoulder.  Not getting any relief from the meperidine 53m.  Wonder if Dr. JArnoldo Moralewill consider authorizing another rx for promethazine 531m  When I take the two of these meds together I am getting some relief?  [facility]: SuCandace Cruise February 06, 2010 1:11 PM    Prescriptions: PROMETHAZINE HCL 50 MG TABS (PROMETHAZINE HCL) 1 every 8 hour as needed nausea  #12 x 0   Entered by:   BoAllyne GeeLPN   Authorized by:   JoRicard DillonD   Signed by:   BoAllyne GeeLPN on 0461/22/4497 Method used:   Electronically to        [phone](retail)       [address]     [place]  [address]     Ph: [phone]     Fax: [phone] RxID:   [id number]

## 2010-11-27 NOTE — Letter (Signed)
Summary: [facility]  [facility]   Imported By: Laural Benes 03/12/2010 13:12:45  _____________________________________________________________________  External Attachment:    Type:   Image     Comment:   External Document

## 2010-11-27 NOTE — Progress Notes (Signed)
Summary: Pt req generic Demerol 40m. Pt pick up at office  Phone Note Refill Request Call back at Home Phone ([phone] from:  Patient on September 27, 2010 1:13 PM  Refills Requested: Medication #1:  DEMEROL 50 MG  TABS one by mouth q 8 hrs as needed for pain   Dosage confirmed as above?Dosage Confirmed Pt req generic. Pt will pick up written script. Pls call when ready.       Method Requested: Pick up at Office Initial call taken by: CBraulio Bosch  September 27, 2010 1:13 PM    Prescriptions: DEMEROL 50 MG  TABS (MEPERIDINE HCL) one by mouth q 8 hrs as needed for pain  #90 x 0   Entered by:   BAllyne Gee LPN   Authorized by:   JRicard DillonMD   Signed by:   BAllyne Gee LPN on 160/11/9845  Method used:   Print then Give to Patient   RxID:   [id number]

## 2010-11-27 NOTE — Letter (Signed)
Summary: [facility]  [facility]   Imported By: Phillis Knack 03/02/2010 08:45:38  _____________________________________________________________________  External Attachment:    Type:   Image     Comment:   External Document

## 2010-11-27 NOTE — Progress Notes (Signed)
  Phone Note Call from Patient Call back at Home Phone [phone]   Caller: Patient Call For: Ricard Dillon MD Summary of Call: Pt has been coughing up thick, green, purulent mucus x one week.  [facility]) No fever. Initial call taken by: Deanna Artis CMA,  July 13, 2010 9:40 AM  Follow-up for Phone Call        per dr Arnoldo Morale clarithromycing 500 two times a day for 7 days per dr Arnoldo Morale Follow-up by: Allyne Gee, LPN,  July 13, 9241 10:05 AM    New/Updated Medications: CLARITHROMYCIN 500 MG TABS (CLARITHROMYCIN) one by mouth two times a day x 7 days Prescriptions: CLARITHROMYCIN 500 MG TABS (CLARITHROMYCIN) one by mouth two times a day x 7 days  #14 x 0   Entered by:   Deanna Artis CMA   Authorized by:   Ricard Dillon MD   Signed by:   Deanna Artis CMA on 07/13/2010   Method used:   Electronically to        [facility] [phone]* (retail)       [place], [place]  [address]       Ph: [phone]       Fax: [phone]   RxID:   [id number]  Pt notified.

## 2010-11-27 NOTE — Progress Notes (Signed)
Summary: Demerol and phenergan  Phone Note Call from Patient   Caller: Patient Call For: Ricard Dillon MD Summary of Call: Pt is asking for Demerol and Phenergan refills as she is going on vacation.  Is having severe neck and back pain.  Asking for a month supply for both.  No iron level was drawn in [place], she states.  Could not tolerate the Seroquel.....gave her headaches, nightmares, and fatigue. [phone] Initial call taken by: Deanna Artis CMA,  April 25, 2010 10:50 AM  Follow-up for Phone Call        pt informed will be ready after 1pm Follow-up by: Allyne Gee, LPN,  April 25, 2766 01:10 AM    Prescriptions: PROMETHAZINE HCL 50 MG TABS (PROMETHAZINE HCL) 1/2 every 8 hour as needed nausea  #30 x 0   Entered by:   Allyne Gee, LPN   Authorized by:   Ricard Dillon MD   Signed by:   Allyne Gee, LPN on 03/49/6116   Method used:   Print then Give to Patient   RxID:   [id number] DEMEROL 50 MG  TABS (MEPERIDINE HCL) one by mouth q 8 hrs as needed for pain  #90 x 0   Entered by:   Allyne Gee, LPN   Authorized by:   Ricard Dillon MD   Signed by:   Allyne Gee, LPN on 47/09/5270   Method used:   Print then Give to Patient   RxID:   [id number]

## 2010-11-27 NOTE — Assessment & Plan Note (Signed)
Summary: 1MTH F/U/CDW   Vital Signs:  Patient profile:   65 year old [name] Height:      64 inches Weight:      137 pounds BMI:     23.60 Temp:     98.2 degrees F oral Pulse rate:   72 / minute Resp:     14 per minute BP sitting:   132 / 76  (left arm)  Vitals Entered By: Allyne Gee, LPN (November 13, 8674 2:02 PM) CC: roa, URI symptoms   Primary Care Provider:  Ricard Dillon MD  CC:  roa and URI symptoms.  History of Present Illness: increased fatigue asthma has been stable mild flair of breathing HA are stable but needs a refill on the demerol increased PND   URI Symptoms      This is a 65 year old woman who presents with URI symptoms.  The patient reports nasal congestion, dry cough, and earache, but denies clear nasal discharge, purulent nasal discharge, sore throat, and sick contacts.  The patient denies fever, low-grade fever (<100.5 degrees), fever of 100.5-103 degrees, fever of 103.1-104 degrees, fever to >104 degrees, stiff neck, dyspnea, wheezing, rash, vomiting, diarrhea, use of an antipyretic, and response to antipyretic.  The patient also reports headache.  The patient denies the following risk factors for Strep sinusitis: unilateral facial pain, unilateral nasal discharge, poor response to decongestant, double sickening, tooth pain, Strep exposure, tender adenopathy, and absence of cough.    Preventive Screening-Counseling & Management  Alcohol-Tobacco     Smoking Status: never     Passive Smoke Exposure: no  Problems Prior to Update: 1)  Acute Maxillary Sinusitis  (ICD-461.0) 2)  Palpitations, Recurrent  (ICD-785.1) 3)  Acute Frontal Sinusitis  (ICD-461.1) 4)  Hyperventilation  (ICD-786.01) 5)  Anemia, Iron Deficiency  (ICD-280.9) 6)  Asthma, With Acute Exacerbation  (ICD-493.92) 7)  Allergic Rhinitis  (ICD-477.9) 8)  Thoracic Aortic Aneurysm  (ICD-441.2) 9)  Hypersomnia  (ICD-780.54) 10)  Chest Pain, Left  (ICD-786.50) 11)  Uns Advrs Eff Uns Rx  Medicinal&biological Sbstnc  (ICD-995.20) 12)  Pneumonia, Left Lower Lobe  (ICD-481) 13)  Hammer Toe  (ICD-755.66) 14)  Chronic Fatigue Syndrome  (ICD-780.71) 15)  Adj Disorder With Mixed Anxiety & Depressed Mood  (ICD-309.28) 16)  Disc Disease, 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 One Puff  Two Times A Day 2)  Singulair 10 Mg  Tabs (Montelukast Sodium) .... Once Daily 3)  Proair Hfa 108 (90 Base) Mcg/act Aers (Albuterol Sulfate) .... 2 Puffs Every 4-6 Hours As Needed 4)  Nasonex 50 Mcg/act Susp (Mometasone Furoate) .... Two Sprays Each Nostril Once Daily As Needed 5)  Lunesta 3 Mg  Tabs (Eszopiclone) .... At Bedtime 6)  Fluoxetine Hcl 40 Mg  Caps (Fluoxetine Hcl) .[name] Kitchen.. 1 Once Daily 7)  Vivelle-Dot 0.0375 Mg/24hr  Pttw (Estradiol) .... Unsure of Dosage/change  2times A Week 8)  Prodrin 195-093-26 Mg Tabs (Apap-Isometheptene-Caffeine) .... One By Mouth Q 6 Hours As Needed Ha 9)  Vitamin D 50000 Unit  Caps (Ergocalciferol) .[name] Kitchen.. 1 Twice A Week 10)  Demerol 50 Mg  Tabs (Meperidine Hcl) .... One By Mouth Q 8 Hrs As Needed For Pain 11)  Carisoprodol 350 Mg  Tabs (Carisoprodol) .[name] Kitchen.. 1 Two Times A Day  As Needed 12)  Verapamil Hcl Cr 180 Mg Cr-Tabs (Verapamil Hcl) .... One By Mouth Daily 13)  Cyanocobalamin 1000 Mcg/ml Soln (Cyanocobalamin) .[name] Kitchen.. 1 Ml Twice A Week 14)  Armour Thyroid 60 Mg Tabs (Thyroid) .[name] Kitchen.. 1&1/2  Once Daily(Total 90) 15)  Promethazine Hcl 50 Mg Tabs (Promethazine Hcl) .[name] Kitchen.. 1 Every 8 Hour As Needed Nausea 16)  Adderall Xr 10 Mg Xr24h-Cap (Amphetamine-Dextroamphetamine) .... As Needed 17)  Slow Release Iron 47.5 Mg  Cr-Tabs (Ferrous Sulfate) .... One By Mouth Bid 18)  Fluconazole 100 Mg Tabs (Fluconazole) .... One By Mouth  Daily  Current Medications (verified): 1)  Dulera 100-5 Mcg/act Aero (Mometasone Furo-Formoterol Fum) .... One Puff  Two Times A Day 2)  Singulair 10 Mg  Tabs (Montelukast Sodium) .... Once Daily 3)  Proair Hfa 108 (90 Base) Mcg/act Aers (Albuterol Sulfate) .... 2 Puffs Every 4-6 Hours As Needed 4)  Nasonex 50 Mcg/act Susp (Mometasone Furoate) .... Two Sprays Each Nostril Once Daily As Needed 5)  Lunesta 3 Mg  Tabs (Eszopiclone) .... At Bedtime 6)  Fluoxetine Hcl 40 Mg  Caps (Fluoxetine Hcl) .[name] Kitchen.. 1 Once Daily 7)  Vivelle-Dot 0.0375 Mg/24hr  Pttw (Estradiol) .... Unsure of Dosage/change  2times A Week 8)  Prodrin 621-308-65 Mg Tabs (Apap-Isometheptene-Caffeine) .... One By Mouth Q 6 Hours As Needed Ha 9)  Vitamin D 50000 Unit  Caps (Ergocalciferol) .[name] Kitchen.. 1 Twice A Week 10)  Demerol 50 Mg  Tabs (Meperidine Hcl) .... One By Mouth Q 8 Hrs As Needed For Pain 11)  Carisoprodol 350 Mg  Tabs (Carisoprodol) .[name] Kitchen.. 1 Two Times A Day As Needed 12)  Verapamil Hcl Cr 180 Mg Cr-Tabs (Verapamil Hcl) .... One By Mouth Daily 13)  Cyanocobalamin 1000 Mcg/ml Soln (Cyanocobalamin) .[name] Kitchen.. 1 Ml Twice A Week 14)  Armour Thyroid 60 Mg Tabs (Thyroid) .[name] Kitchen.. 1&1/2  Once Daily(Total 90) 15)  Promethazine Hcl 50 Mg Tabs (Promethazine Hcl) .[name] Kitchen.. 1 Every 8 Hour As Needed Nausea 16)  Adderall Xr 10 Mg Xr24h-Cap (Amphetamine-Dextroamphetamine) .... As Needed 17)  Slow Release Iron 47.5 Mg  Cr-Tabs (Ferrous Sulfate) .... One By Mouth Bid  Allergies (verified): 1)  ! Premarin 2)  ! Feldene 3)  ! Codeine 4)  ! Erythromycin 5)  ! Duragesic-25 (Fentanyl)  Past History:  Family History: Last updated: 12/27/2008 father... leukemia at 13 mother... Family History of Arthritis  Social History: Last updated: 12/27/2008 Married Alcohol use-yes Drug use-no Regular exercise-no  Risk Factors: Exercise: no (07/14/2007)  Risk Factors: Smoking Status: never (11/13/2009) Passive Smoke Exposure: no (11/13/2009)  Past medical,  surgical, family and social histories (including risk factors) reviewed, and no changes noted (except as noted below).  Past Medical History: Reviewed history from 04/18/2009 and no changes required. Hypothyroidism Asthma      - PFT 02/01/09 FEV1 2.71 (125%), FVC 3.47 (117%), FEV1% 78, TLC 5.29 (109%), DLCO 98%, +BD Chronic fatigue syndrom Mitral valve prolapse  Migraine Headaches Interstitial cystitis Dysthymia Osteoarthritis 4cm ascending aortic dilation      - From CT chest 11/29/08    Past Surgical History: Reviewed history from 02/01/2009 and no changes required. Hysterectomy Tonsillectomy Sinus surgery Hand surgery  Foot surgery Dr. Eleanora Neighbor 2009/2010  Family History: Reviewed history from 12/27/2008 and no changes required. father... leukemia at 56 mother... Family History of Arthritis  Social History: Reviewed history from 12/27/2008 and no changes required. Married Alcohol use-yes Drug use-no Regular exercise-no  Review of Systems       The patient complains of hoarseness,  prolonged cough, and headaches.  The patient denies anorexia, fever, weight loss, weight gain, vision loss, decreased hearing, chest pain, syncope, dyspnea on exertion, peripheral edema, hemoptysis, abdominal pain, melena, hematochezia, severe indigestion/heartburn, hematuria, incontinence, genital sores, muscle weakness, suspicious skin lesions, transient blindness, difficulty walking, depression, unusual weight change, abnormal bleeding, enlarged lymph nodes, angioedema, and breast masses.    Physical Exam  General:  normal appearance and healthy appearing.   Head:  normocephalic and atraumatic.   Eyes:  PERRLA and EOMI.   Ears:  TMs intact and clear with normal canals Nose:  clear drainage, no tenderness Mouth:  no deformity or lesions Neck:  no JVD.   Lungs:  coarse breath sounds, diminished air entry, no wheeze Heart:  normal rate, no murmur, and no gallop.   Abdomen:  Bowel sounds  positive,abdomen soft and non-tender without masses, organomegaly or hernias noted. Neurologic:  alert & oriented X3, cranial nerves II-XII intact, and sensation intact to light touch.     Impression & Recommendations:  Problem # 1:  ASTHMA, WITH ACUTE EXACERBATION (ICD-493.92)  fair control with only mild flair Her updated medication list for this problem includes:    Dulera 100-5 Mcg/act Aero (Mometasone furo-formoterol fum) ..... One puff  two times a day    Singulair 10 Mg Tabs (Montelukast sodium) ..... Once daily    Proair Hfa 108 (90 Base) Mcg/act Aers (Albuterol sulfate) .[name] Kitchen... 2 puffs every 4-6 hours as needed  Pulmonary Functions Reviewed: O2 sat: 98 (07/11/2009)  Problem # 2:  ANEMIA, IRON DEFICIENCY (ICD-280.9)  Her updated medication list for this problem includes:    Cyanocobalamin 1000 Mcg/ml Soln (Cyanocobalamin) .[name] Kitchen... 1 ml twice a week    Slow Release Iron 47.5 Mg Cr-tabs (Ferrous sulfate) ..... One by mouth bid  Hgb: 13.6 (07/31/2009)   Hct: 38.7 (07/31/2009)   Platelets: 202.0 (07/31/2009) RBC: 4.16 (07/31/2009)   RDW: 12.7 (07/31/2009)   WBC: 4.0 (07/31/2009) MCV: 93.0 (07/31/2009)   MCHC: 35.0 (07/31/2009) Iron: 91 (07/31/2009)   % Sat: 22.0 (07/31/2009) B12: >1500 pg/mL (07/31/2009)   Folate: 8.8 (07/31/2009)   TSH: 1.33 (04/25/2009)  Orders: TLB-B12 + Folate Pnl (16109_60454-U98/JXB) TLB-IBC Pnl (Iron/FE;Transferrin) (83550-IBC) TLB-CBC Platelet - w/Differential (85025-CBCD)  Problem # 3:  CHRONIC FATIGUE SYNDROME (ICD-780.71) stable  Problem # 4:  FIBROMYALGIA (ICD-729.1)  Her updated medication list for this problem includes:    Demerol 50 Mg Tabs (Meperidine hcl) ..... One by mouth q 8 hrs as needed for pain    Carisoprodol 350 Mg Tabs (Carisoprodol) .[name] Kitchen... 1 two times a day as needed  Problem # 5:  MIGRAINE, CLASSICAL (ICD-346.00)  Her updated medication list for this problem includes:    Prodrin 500-130-20 Mg Tabs (Apap-isometheptene-caffeine)  ..... One by mouth q 6 hours as needed ha    Demerol 50 Mg Tabs (Meperidine hcl) ..... One by mouth q 8 hrs as needed for pain  Headache diary reviewed.  Problem # 6:  ACUTE MAXILLARY SINUSITIS (ICD-461.0)  Her updated medication list for this problem includes:    Nasonex 50 Mcg/act Susp (Mometasone furoate) .[name] Kitchen..[name] Kitchen Two sprays each nostril once daily as needed    Smz-tmp Ds 800-160 Mg Tabs (Sulfamethoxazole-trimethoprim) ..... One by mouth bid  Instructed on treatment. Call if symptoms persist or worsen.   Complete Medication List: 1)  Dulera 100-5 Mcg/act Aero (Mometasone furo-formoterol fum) .... One puff  two times a day 2)  Singulair 10 Mg Tabs (Montelukast sodium) .... Once daily 3)  Proair Hfa 108 (90  Base) Mcg/act Aers (Albuterol sulfate) .... 2 puffs every 4-6 hours as needed 4)  Nasonex 50 Mcg/act Susp (Mometasone furoate) .... Two sprays each nostril once daily as needed 5)  Lunesta 3 Mg Tabs (Eszopiclone) .... At bedtime 6)  Fluoxetine Hcl 40 Mg Caps (Fluoxetine hcl) .[name] Kitchen.. 1 once daily 7)  Vivelle-dot 0.0375 Mg/24hr Pttw (Estradiol) .... Unsure of dosage/change  2times a week 8)  Prodrin 194-174-08 Mg Tabs (Apap-isometheptene-caffeine) .... One by mouth q 6 hours as needed ha 9)  Vitamin D 50000 Unit Caps (Ergocalciferol) .[name] Kitchen.. 1 twice a week 10)  Demerol 50 Mg Tabs (Meperidine hcl) .... One by mouth q 8 hrs as needed for pain 11)  Carisoprodol 350 Mg Tabs (Carisoprodol) .[name] Kitchen.. 1 two times a day as needed 12)  Verapamil Hcl Cr 180 Mg Cr-tabs (Verapamil hcl) .... One by mouth daily 13)  Cyanocobalamin 1000 Mcg/ml Soln (Cyanocobalamin) .[name] Kitchen.. 1 ml twice a week 14)  Armour Thyroid 60 Mg Tabs (Thyroid) .[name] Kitchen.. 1&1/2  once daily(total 90) 15)  Promethazine Hcl 50 Mg Tabs (Promethazine hcl) .[name] Kitchen.. 1 every 8 hour as needed nausea 16)  Adderall Xr 10 Mg Xr24h-cap (Amphetamine-dextroamphetamine) .... As needed 17)  Slow Release Iron 47.5 Mg Cr-tabs (Ferrous sulfate) .... One by mouth bid 18)   Smz-tmp Ds 800-160 Mg Tabs (Sulfamethoxazole-trimethoprim) .... One by mouth bid  Other Orders: TLB-TSH (Thyroid Stimulating Hormone) (84443-TSH) TLB-T4 (Thyrox), Free [phone]) TLB-T3, Free (Triiodothyronine) (84481-T3FREE)  Patient Instructions: 1)  Please schedule a follow-up appointment in 2 months. Prescriptions: SMZ-TMP DS 800-160 MG TABS (SULFAMETHOXAZOLE-TRIMETHOPRIM) one by mouth BID  #20 x 0   Entered and Authorized by:   Ricard Dillon MD   Signed by:   Ricard Dillon MD on 11/13/2009   Method used:   Print then Give to Patient   RxID:   [id number] DEMEROL 50 MG  TABS (MEPERIDINE HCL) one by mouth q 8 hrs as needed for pain  #90 x 0   Entered by:   Allyne Gee, LPN   Authorized by:   Ricard Dillon MD   Signed by:   Allyne Gee, LPN on 77/41/2878   Method used:   Print then Give to Patient   RxID:   [id number]

## 2010-11-27 NOTE — Letter (Signed)
Summary: Marissa [name]   Imported By: Laural Benes 06/21/2010 14:22:47  _____________________________________________________________________  External Attachment:    Type:   Image     Comment:   External Document

## 2010-11-27 NOTE — Progress Notes (Signed)
Summary: pneumonia   Phone Note Call from Patient   Caller: Patient Call For: Dr. Arnoldo Morale Reason for Call: Acute Illness Summary of Call: Pt is having headaches and night sweats .........[name] Kitchenextreme fatigue....[name] KitchenMarland KitchenLevaquin 750 mg. one daily , and feels sick after taking it. Not sleeping.  [facility] / [place] [phone] Not eating and having weight loss.  Initial call taken by: Deanna Artis CMA,  December 20, 2008 10:35 AM  Follow-up for Phone Call        per drjenkins- have her see pulmonary- appointment with dr Halford Chessman with [facility] for 3-2 tuesday to arrive at 3:45 for a 4 pm. left message on machine for pt to return call . Follow-up by: Allyne Gee, LPN,  December 21, 8719 11:30 AM  Additional Follow-up for Phone Call Additional follow up Details #1::        Pt. notified. Additional Follow-up by: Deanna Artis CMA,  December 20, 2008 1:00 PM

## 2010-11-27 NOTE — Assessment & Plan Note (Signed)
Summary: 2 month rov/njr   Vital Signs:  Patient profile:   65 year old female Height:      64 inches Weight:      136 pounds BMI:     23.43 Temp:     98.2 degrees F oral Pulse rate:   68 / minute Resp:     14 per minute BP sitting:   136 / 80  (left arm)  Vitals Entered By: Allyne Gee, LPN (Mar 21, 1659 6:30 PM) CC: roa-didnt take ritalin - was pricey an d she decided it would probably "wire" her   Primary Care Provider:  Ricard Dillon MD  CC:  roa-didnt take ritalin - was pricey an d she decided it would probably "wire" her.  History of Present Illness: he ritalin was expensive and she could not afford this "experiment" she has used small amounts of aderal and she noted "seens of feeling wired" the stimulant calss is not the answer for her She saw  the fibromyagia specialist in [place] and he recommended a "fit bit" for monitering sleep and activity the pt may be getting only 5 hours of sleep efficinecy  Preventive Screening-Counseling & Management  Alcohol-Tobacco     Smoking Status: never     Passive Smoke Exposure: no  Problems Prior to Update: 1)  Osteoarthritis, Generalized, Hand  (ICD-715.04) 2)  Acute Maxillary Sinusitis  (ICD-461.0) 3)  Palpitations, Recurrent  (ICD-785.1) 4)  Acute Frontal Sinusitis  (ICD-461.1) 5)  Hyperventilation  (ICD-786.01) 6)  Anemia, Iron Deficiency  (ICD-280.9) 7)  Asthma, With Acute Exacerbation  (ICD-493.92) 8)  Allergic Rhinitis  (ICD-477.9) 9)  Thoracic Aortic Aneurysm  (ICD-441.2) 10)  Hypersomnia  (ICD-780.54) 11)  Chest Pain, Left  (ICD-786.50) 12)  Uns Advrs Eff Uns Rx Medicinal&biological Sbstnc  (ICD-995.20) 13)  Pneumonia, Left Lower Lobe  (ICD-481) 14)  Hammer Toe  (ICD-755.66) 15)  Chronic Fatigue Syndrome  (ICD-780.71) 16)  Adj Disorder With Mixed Anxiety & Depressed Mood  (ICD-309.28) 17)  Disc Disease, 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) 18)  Migraine, Classical  (ICD-346.00) 19)  Osteoarthritis  (ICD-715.90) 20)   Fibromyalgia  (ICD-729.1) 21)  Asthma  (ICD-493.90) 22)  Hypothyroidism  (ICD-244.9)  Current Problems (verified): 1)  Osteoarthritis, Generalized, Hand  (ICD-715.04) 2)  Acute Maxillary Sinusitis  (ICD-461.0) 3)  Palpitations, Recurrent  (ICD-785.1) 4)  Acute Frontal Sinusitis  (ICD-461.1) 5)  Hyperventilation  (ICD-786.01) 6)  Anemia, Iron Deficiency  (ICD-280.9) 7)  Asthma, With Acute Exacerbation  (ICD-493.92) 8)  Allergic Rhinitis  (ICD-477.9) 9)  Thoracic Aortic Aneurysm  (ICD-441.2) 10)  Hypersomnia  (ICD-780.54) 11)  Chest Pain, Left  (ICD-786.50) 12)  Uns Advrs Eff Uns Rx Medicinal&biological Sbstnc  (ICD-995.20) 13)  Pneumonia, Left Lower Lobe  (ICD-481) 14)  Hammer Toe  (ICD-755.66) 15)  Chronic Fatigue Syndrome  (ICD-780.71) 16)  Adj Disorder With Mixed Anxiety & Depressed Mood  (ICD-309.28) 17)  Disc Disease, 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 One Puff  Two Times A Day 2)  Singulair 10 Mg  Tabs (Montelukast Sodium) .... Once Daily 3)  Proair Hfa 108 (90 Base) Mcg/act Aers (Albuterol Sulfate) .... 2 Puffs Every 4-6 Hours As Needed 4)  Nasonex 50 Mcg/act Susp (Mometasone Furoate) .... Two Sprays Each Nostril Once Daily As Needed 5)  Lunesta 3 Mg  Tabs (Eszopiclone) .... At Bedtime 6)  Fluoxetine Hcl 40 Mg  Caps (Fluoxetine Hcl) .[name] Kitchen.. 1 Once Daily 7)  Vivelle-Dot 0.0375 Mg/24hr  Pttw (Estradiol) .... Unsure of Dosage/change  2times A Week 8)  Vitamin D 50000 Unit  Caps (Ergocalciferol) .[name] Kitchen.. 1 Twice A Week 9)  Demerol 50 Mg  Tabs (Meperidine Hcl) .... One By Mouth Q 8 Hrs As Needed For Pain 10)  Carisoprodol 350 Mg  Tabs (Carisoprodol) .[name] Kitchen.. 1 Two Times A Day As Needed 11)  Verapamil Hcl Cr 180 Mg Cr-Tabs (Verapamil Hcl) ....  One By Mouth Daily 12)  Cyanocobalamin 1000 Mcg/ml Soln (Cyanocobalamin) .[name] Kitchen.. 1 Ml Twice A Week 13)  Armour Thyroid 60 Mg Tabs (Thyroid) .[name] Kitchen.. 1&1/2  Once Daily(Total 90) 14)  Promethazine Hcl 50 Mg Tabs (Promethazine Hcl) .[name] Kitchen.. 1 Every 8 Hour As Needed Nausea 15)  Slow Release Iron 47.5 Mg  Cr-Tabs (Ferrous Sulfate) .... One By Mouth Bid 16)  Smz-Tmp Ds 800-160 Mg Tabs (Sulfamethoxazole-Trimethoprim) .... One By Mouth Bid 17)  Ritalin La 10 Mg Xr24h-Cap (Methylphenidate Hcl) .... One By Mouth Daily  Current Medications (verified): 1)  Dulera 100-5 Mcg/act Aero (Mometasone Furo-Formoterol Fum) .... One Puff  Two Times A Day 2)  Singulair 10 Mg  Tabs (Montelukast Sodium) .... Once Daily 3)  Proair Hfa 108 (90 Base) Mcg/act Aers (Albuterol Sulfate) .... 2 Puffs Every 4-6 Hours As Needed 4)  Nasonex 50 Mcg/act Susp (Mometasone Furoate) .... Two Sprays Each Nostril Once Daily As Needed 5)  Lunesta 3 Mg  Tabs (Eszopiclone) .... At Bedtime 6)  Fluoxetine Hcl 40 Mg  Caps (Fluoxetine Hcl) .[name] Kitchen.. 1 Once Daily 7)  Vivelle-Dot 0.0375 Mg/24hr  Pttw (Estradiol) .... Unsure of Dosage/change  2times A Week 8)  Vitamin D 50000 Unit  Caps (Ergocalciferol) .[name] Kitchen.. 1 Twice A Week 9)  Demerol 50 Mg  Tabs (Meperidine Hcl) .... One By Mouth Q 8 Hrs As Needed For Pain 10)  Carisoprodol 350 Mg  Tabs (Carisoprodol) .[name] Kitchen.. 1 Two Times A Day As Needed 11)  Verapamil Hcl Cr 180 Mg Cr-Tabs (Verapamil Hcl) .... One By Mouth Daily 12)  Cyanocobalamin 1000 Mcg/ml Soln (Cyanocobalamin) .[name] Kitchen.. 1 Ml Twice A Week 13)  Armour Thyroid 60 Mg Tabs (Thyroid) .[name] Kitchen.. 1&1/2  Once Daily(Total 90) 14)  Promethazine Hcl 50 Mg Tabs (Promethazine Hcl) .... 1/2 Every 8 Hour As Needed Nausea 15)  Slow Release Iron 47.5 Mg  Cr-Tabs (Ferrous Sulfate) .... One By Mouth Bid  Allergies (verified): 1)  ! Premarin 2)  ! Feldene 3)  ! Codeine 4)  ! Erythromycin 5)  ! Duragesic-25 (Fentanyl)  Past History:  Family History: Last updated:  12/27/2008 father... leukemia at 21 mother... Family History of Arthritis  Social History: Last updated: 12/27/2008 Married Alcohol use-yes Drug use-no Regular exercise-no  Risk Factors: Exercise: no (07/14/2007)  Risk Factors: Smoking Status: never (03/20/2010) Passive Smoke Exposure: no (03/20/2010)  Past medical, surgical, family and social histories (including risk factors) reviewed, and no changes noted (except as noted below).  Past Medical History: Reviewed history from 04/18/2009 and no changes required. Hypothyroidism Asthma      - PFT 02/01/09 FEV1 2.71 (125%), FVC 3.47 (117%), FEV1% 78, TLC 5.29 (109%), DLCO 98%, +BD Chronic fatigue syndrom Mitral valve prolapse  Migraine Headaches Interstitial cystitis Dysthymia Osteoarthritis 4cm ascending aortic dilation      - From CT chest 11/29/08    Past Surgical History: Reviewed history from 02/01/2009 and no changes required. Hysterectomy Tonsillectomy  Sinus surgery Hand surgery  Foot surgery Dr. Eleanora Neighbor 2009/2010  Family History: Reviewed history from 12/27/2008 and no changes required. father... leukemia at 82 mother... Family History of Arthritis  Social History: Reviewed history from 12/27/2008 and no changes required. Married Alcohol use-yes Drug use-no Regular exercise-no  Review of Systems  The patient denies anorexia, fever, weight loss, weight gain, vision loss, decreased hearing, hoarseness, chest pain, syncope, dyspnea on exertion, peripheral edema, prolonged cough, headaches, hemoptysis, abdominal pain, melena, hematochezia, severe indigestion/heartburn, hematuria, incontinence, genital sores, muscle weakness, suspicious skin lesions, transient blindness, difficulty walking, depression, unusual weight change, abnormal bleeding, enlarged lymph nodes, angioedema, and breast masses.         fatigue  and decreased activity  Physical Exam  General:  normal appearance and healthy appearing.    Head:  normocephalic and atraumatic.   Eyes:  PERRLA and EOMI.   Ears:  TMs intact and clear with normal canals Nose:  clear drainage, no tenderness Mouth:  no deformity or lesions Neck:  no JVD.   Lungs:  coarse breath sounds, diminished air entry, no wheeze Heart:  normal rate, no murmur, and no gallop.   Abdomen:  Bowel sounds positive,abdomen soft and non-tender without masses, organomegaly or hernias noted.   Impression & Recommendations:  Problem # 1:  ANEMIA, IRON DEFICIENCY (ICD-280.9)  Her updated medication list for this problem includes:    Cyanocobalamin 1000 Mcg/ml Soln (Cyanocobalamin) .[name] Kitchen... 1 ml twice a week    Slow Release Iron 47.5 Mg Cr-tabs (Ferrous sulfate) ..... One by mouth bid  Hgb: 12.9 (11/13/2009)   Hct: 38.7 (11/13/2009)   Platelets: 225.0 (11/13/2009) RBC: 3.94 (11/13/2009)   RDW: 12.1 (11/13/2009)   WBC: 6.2 (11/13/2009) MCV: 98.2 (11/13/2009)   MCHC: 33.3 (11/13/2009) Iron: 66 (11/13/2009)   % Sat: 15.6 (11/13/2009) B12: 947 (11/13/2009)   Folate: 18.2 (11/13/2009)   TSH: 1.04 (11/13/2009)  Orders: Venipuncture (86761) TLB-CBC Platelet - w/Differential (85025-CBCD) TLB-IBC Pnl (Iron/FE;Transferrin) (83550-IBC)  Problem # 2:  FIBROMYALGIA (ICD-729.1) significant sleep efficinecy problems Her updated medication list for this problem includes:    Demerol 50 Mg Tabs (Meperidine hcl) ..... One by mouth q 8 hrs as needed for pain    Carisoprodol 350 Mg Tabs (Carisoprodol) .[name] Kitchen... 1 two times a day as needed  Problem # 3:  HYPERSOMNIA (ICD-780.54) seroguil a HS trial  Problem # 4:  ADJ DISORDER WITH MIXED ANXIETY & DEPRESSED MOOD (ICD-309.28) on prozac  and adding a nl  Complete Medication List: 1)  Dulera 100-5 Mcg/act Aero (Mometasone furo-formoterol fum) .... One puff  two times a day 2)  Singulair 10 Mg Tabs (Montelukast sodium) .... Once daily 3)  Proair Hfa 108 (90 Base) Mcg/act Aers (Albuterol sulfate) .... 2 puffs every 4-6 hours as  needed 4)  Nasonex 50 Mcg/act Susp (Mometasone furoate) .... Two sprays each nostril once daily as needed 5)  Fluoxetine Hcl 40 Mg Caps (Fluoxetine hcl) .[name] Kitchen.. 1 once daily 6)  Vivelle-dot 0.0375 Mg/24hr Pttw (Estradiol) .... Unsure of dosage/change  2times a week 7)  Vitamin D 50000 Unit Caps (Ergocalciferol) .[name] Kitchen.. 1 twice a week 8)  Demerol 50 Mg Tabs (Meperidine hcl) .... One by mouth q 8 hrs as needed for pain 9)  Carisoprodol 350 Mg Tabs (Carisoprodol) .[name] Kitchen.. 1 two times a day as needed 10)  Verapamil Hcl Cr 180 Mg Cr-tabs (Verapamil hcl) .... One by mouth daily 11)  Cyanocobalamin 1000 Mcg/ml Soln (Cyanocobalamin) .[name] Kitchen.. 1 ml twice a week 12)  Armour Thyroid  60 Mg Tabs (Thyroid) .[name] Kitchen.. 1&1/2  once daily(total 90) 13)  Promethazine Hcl 50 Mg Tabs (Promethazine hcl) .... 1/2 every 8 hour as needed nausea 14)  Slow Release Iron 47.5 Mg Cr-tabs (Ferrous sulfate) .... One by mouth bid 15)  Seroquel 25 Mg Tabs (Quetiapine fumarate) .... 1/2 by mouth q hs for sleep  Patient Instructions: 1)  Please schedule a follow-up appointment in 2 months. Prescriptions: ARMOUR THYROID 60 MG TABS (THYROID) 1&1/2  once daily(total 90)  #45 Tablet x 4   Entered and Authorized by:   Ricard Dillon MD   Signed by:   Ricard Dillon MD on 03/20/2010   Method used:   Electronically to        [facility] [phone]* (retail)       [address]       [place], [place]  [address]       Ph: [phone]       Fax: [phone]   RxID:   [id number] FLUOXETINE HCL 40 MG  CAPS (FLUOXETINE HCL) 1 once daily  #30 Capsule x 6   Entered and Authorized by:   Ricard Dillon MD   Signed by:   Ricard Dillon MD on 03/20/2010   Method used:   Electronically to        [facility] [phone]* (retail)       [place], [place]  [address]       Ph: [phone]       Fax: [phone]   RxID:   [id number] SEROQUEL 25 MG TABS (QUETIAPINE FUMARATE) 1/2  by mouth q HS for sleep  #30 x 2   Entered and Authorized by:   Ricard Dillon MD   Signed by:   Ricard Dillon MD on 03/20/2010   Method used:   Electronically to        [facility] [phone]* (retail)       [address]       [place], [place]  [address]       Ph: [phone]       Fax: [phone]   RxID:   [id number]

## 2010-11-27 NOTE — Letter (Signed)
Summary: CONTROLLED SUBSTANCE CONTRACT  CONTROLLED SUBSTANCE CONTRACT   Imported By: Betsy Pries 04/26/2010 10:06:01  _____________________________________________________________________  External Attachment:    Type:   Image     Comment:   External Document

## 2010-11-27 NOTE — Progress Notes (Signed)
Summary: medication questions  Phone Note Call from Patient   Caller: Patient Call For: Dr. Arnoldo Morale Reason for Call: Acute Illness Summary of Call: Pt's BP is running 100/44 and would like to decrease her Metoprolol if Dr. Arnoldo Morale feels this is ok.  She is very fatigued. She also would like to have her B12 refilled and called to [facility] [phone] She takes 1 ml 2 x weekly. Her local pharmacy is [facility] [phone] Initial call taken by: [facility] CMA,  June 14, 2008 10:14 AM    New/Updated Medications: COBAL-1000 1000 MCG/ML INJ SOLN (CYANOCOBALAMIN) 1 ml q 2 weeks   Prescriptions: COBAL-1000 1000 MCG/ML INJ SOLN (CYANOCOBALAMIN) 1 ml q 2 weeks  #24m x 2   Entered by:   BAllyne Gee LPN   Authorized by:   JRicard DillonMD   Signed by:   BAllyne Gee LPN on 083/43/7357  Method used:   [place]ronically [place] to ...       [facility]      [place]  [address]      Ph: [phone]      Fax: [phone]  RxID:   [id number]   Appended Document: medication questions per dr jArnoldo Moralechange metoprolol to 25 once daily and only b 12 two times a month

## 2010-11-27 NOTE — Assessment & Plan Note (Signed)
Summary: 2 month ov//ccm/pt req flu shot/cjr   Vital Signs:  Patient profile:   65 year old female Height:      64 inches Weight:      134 pounds BMI:     23.08 Temp:     98.2 degrees F oral Pulse rate:   68 / minute Resp:     14 per minute BP sitting:   130 / 78  (left arm)  Vitals Entered By: Allyne Gee, LPN (July 26, 7095 3:41 PM) CC: roa-stopp seroquel Is Patient Diabetic? No   Primary Care Provider:  Ricard Dillon MD  CC:  roa-stopp seroquel.  History of Present Illness: could not tolerate the seroquil Follow-Up Visit      This is a 65 year old woman who presents for Follow-up visit.  The patient denies chest pain, palpitations, dizziness, syncope, low blood sugar symptoms, high blood sugar symptoms, edema, SOB, DOE, PND, and orthopnea.  Since the last visit the patient notes no new problems or concerns.  The patient reports taking meds as prescribed.  When questioned about possible medication side effects, the patient notes none, fatigue, and depressive symptoms.   Dr Jeral Fruit suggested resuming the doxipen liguid  Preventive Screening-Counseling & Management  Alcohol-Tobacco     Smoking Status: never     Passive Smoke Exposure: no     Tobacco Counseling: not indicated; no tobacco use  Problems Prior to Update: 1)  Osteoarthritis, Generalized, Hand  (ICD-715.04) 2)  Acute Maxillary Sinusitis  (ICD-461.0) 3)  Palpitations, Recurrent  (ICD-785.1) 4)  Acute Frontal Sinusitis  (ICD-461.1) 5)  Hyperventilation  (ICD-786.01) 6)  Anemia, Iron Deficiency  (ICD-280.9) 7)  Asthma, With Acute Exacerbation  (ICD-493.92) 8)  Allergic Rhinitis  (ICD-477.9) 9)  Thoracic Aortic Aneurysm  (ICD-441.2) 10)  Hypersomnia  (ICD-780.54) 11)  Chest Pain, Left  (ICD-786.50) 12)  Uns Advrs Eff Uns Rx Medicinal&biological Sbstnc  (ICD-995.20) 13)  Pneumonia, Left Lower Lobe  (ICD-481) 14)  Hammer Toe  (ICD-755.66) 15)  Chronic Fatigue Syndrome  (ICD-780.71) 16)  Adj Disorder  With Mixed Anxiety & Depressed Mood  (ICD-309.28) 17)  Disc Disease, 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) 18)  Migraine, Classical  (ICD-346.00) 19)  Osteoarthritis  (ICD-715.90) 20)  Fibromyalgia  (ICD-729.1) 21)  Asthma  (ICD-493.90) 22)  Hypothyroidism  (ICD-244.9)  Current Problems (verified): 1)  Osteoarthritis, Generalized, Hand  (ICD-715.04) 2)  Acute Maxillary Sinusitis  (ICD-461.0) 3)  Palpitations, Recurrent  (ICD-785.1) 4)  Acute Frontal Sinusitis  (ICD-461.1) 5)  Hyperventilation  (ICD-786.01) 6)  Anemia, Iron Deficiency  (ICD-280.9) 7)  Asthma, With Acute Exacerbation  (ICD-493.92) 8)  Allergic Rhinitis  (ICD-477.9) 9)  Thoracic Aortic Aneurysm  (ICD-441.2) 10)  Hypersomnia  (ICD-780.54) 11)  Chest Pain, Left  (ICD-786.50) 12)  Uns Advrs Eff Uns Rx Medicinal&biological Sbstnc  (ICD-995.20) 13)  Pneumonia, Left Lower Lobe  (ICD-481) 14)  Hammer Toe  (ICD-755.66) 15)  Chronic Fatigue Syndrome  (ICD-780.71) 16)  Adj Disorder With Mixed Anxiety & Depressed Mood  (ICD-309.28) 17)  Disc Disease, 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 One Puff  Two Times A Day 2)  Singulair 10 Mg  Tabs (Montelukast Sodium) .... Once Daily 3)  Proair Hfa 108 (90 Base) Mcg/act Aers (Albuterol Sulfate) .... 2 Puffs Every 4-6  Hours As Needed 4)  Nasonex 50 Mcg/act Susp (Mometasone Furoate) .... Two Sprays Each Nostril Once Daily As Needed 5)  Fluoxetine Hcl 40 Mg  Caps (Fluoxetine Hcl) .[name] Kitchen.. 1 Once Daily 6)  Vivelle-Dot 0.0375 Mg/24hr  Pttw (Estradiol) .... Unsure of Dosage/change  2times A Week 7)  Ergocalciferol 50000 Unit Caps (Ergocalciferol) .[name] Kitchen.. 1 Twice A Week 8)  Demerol 50 Mg  Tabs (Meperidine Hcl) .... One By Mouth Q 8 Hrs As Needed For Pain 9)  Carisoprodol  350 Mg  Tabs (Carisoprodol) .[name] Kitchen.. 1 Two Times A Day As Needed 10)  Verapamil Hcl Cr 180 Mg Cr-Tabs (Verapamil Hcl) .... One By Mouth Daily 11)  Cyanocobalamin 1000 Mcg/ml Soln (Cyanocobalamin) .[name] Kitchen.. 1 Ml Twice A Week 12)  Armour Thyroid 60 Mg Tabs (Thyroid) .[name] Kitchen.. 1&1/2  Once Daily(Total 90) 13)  Promethazine Hcl 50 Mg Tabs (Promethazine Hcl) .... 1/2 Every 8 Hour As Needed Nausea 14)  Slow Release Iron 47.5 Mg  Cr-Tabs (Ferrous Sulfate) .... One By Mouth Bid 15)  Seroquel 25 Mg Tabs (Quetiapine Fumarate) .... 1/2 By Mouth Q Hs For Sleep 16)  Clarithromycin 500 Mg Tabs (Clarithromycin) .... One By Mouth Two Times A Day X 7 Days  Current Medications (verified): 1)  Dulera 100-5 Mcg/act Aero (Mometasone Furo-Formoterol Fum) .... One Puff  Two Times A Day 2)  Singulair 10 Mg  Tabs (Montelukast Sodium) .... Once Daily 3)  Proair Hfa 108 (90 Base) Mcg/act Aers (Albuterol Sulfate) .... 2 Puffs Every 4-6 Hours As Needed 4)  Nasonex 50 Mcg/act Susp (Mometasone Furoate) .... Two Sprays Each Nostril Once Daily As Needed 5)  Fluoxetine Hcl 40 Mg  Caps (Fluoxetine Hcl) .[name] Kitchen.. 1 Once Daily 6)  Vivelle-Dot 0.0375 Mg/24hr  Pttw (Estradiol) .... Unsure of Dosage/change  2times A Week 7)  Ergocalciferol 50000 Unit Caps (Ergocalciferol) .[name] Kitchen.. 1 Twice A Week 8)  Demerol 50 Mg  Tabs (Meperidine Hcl) .... One By Mouth Q 8 Hrs As Needed For Pain 9)  Carisoprodol 350 Mg  Tabs (Carisoprodol) .[name] Kitchen.. 1 Two Times A Day As Needed 10)  Verapamil Hcl Cr 180 Mg Cr-Tabs (Verapamil Hcl) .... One By Mouth Daily 11)  Cyanocobalamin 1000 Mcg/ml Soln (Cyanocobalamin) .[name] Kitchen.. 1 Ml Twice A Week 12)  Armour Thyroid 60 Mg Tabs (Thyroid) .[name] Kitchen.. 1&1/2  Once Daily(Total 90) 13)  Promethazine Hcl 50 Mg Tabs (Promethazine Hcl) .... 1/2 Every 8 Hour As Needed Nausea 14)  Slow Release Iron 47.5 Mg  Cr-Tabs (Ferrous Sulfate) .... One By Mouth Bid 15)  Clarithromycin 500 Mg Tabs (Clarithromycin) .... One By Mouth Two Times A Day X 7 Days 16)  Doxepin Hcl  10 Mg/ml Conc (Doxepin Hcl) .... 2-5 Gtts At Bedtime  Allergies (verified): 1)  ! Premarin 2)  ! Feldene 3)  ! Codeine 4)  ! Erythromycin 5)  ! Duragesic-25 (Fentanyl)  Past History:  Family History: Last updated: 12/27/2008 father... leukemia at 71 mother... Family History of Arthritis  Social History: Last updated: 12/27/2008 Married Alcohol use-yes Drug use-no Regular exercise-no  Risk Factors: Exercise: no (07/14/2007)  Risk Factors: Smoking Status: never (07/25/2010) Passive Smoke Exposure: no (07/25/2010)  Past medical, surgical, family and social histories (including risk factors) reviewed, and no changes noted (except as noted below).  Past Medical History: Reviewed history from 04/18/2009 and no changes required. Hypothyroidism Asthma      - PFT 02/01/09 FEV1 2.71 (125%), FVC 3.47 (117%), FEV1% 78, TLC 5.29 (109%), DLCO 98%, +BD Chronic fatigue syndrom Mitral valve prolapse  Migraine  Headaches Interstitial cystitis Dysthymia Osteoarthritis 4cm ascending aortic dilation      - From CT chest 11/29/08    Past Surgical History: Reviewed history from 02/01/2009 and no changes required. Hysterectomy Tonsillectomy Sinus surgery Hand surgery  Foot surgery Dr. Eleanora Neighbor 2009/2010  Family History: Reviewed history from 12/27/2008 and no changes required. father... leukemia at 47 mother... Family History of Arthritis  Social History: Reviewed history from 12/27/2008 and no changes required. Married Alcohol use-yes Drug use-no Regular exercise-no  Review of Systems  The patient denies anorexia, fever, weight loss, weight gain, vision loss, decreased hearing, hoarseness, chest pain, syncope, dyspnea on exertion, peripheral edema, prolonged cough, headaches, hemoptysis, abdominal pain, melena, hematochezia, severe indigestion/heartburn, hematuria, incontinence, genital sores, muscle weakness, suspicious skin lesions, transient blindness, difficulty  walking, depression, unusual weight change, abnormal bleeding, enlarged lymph nodes, angioedema, and breast masses.         Flu Vaccine Consent Questions     Do you have a history of severe allergic reactions to this vaccine? no    Any prior history of allergic reactions to egg and/or gelatin? no    Do you have a sensitivity to the preservative Thimersol? no    Do you have a past history of Guillan-Barre Syndrome? no    Do you currently have an acute febrile illness? no    Have you ever had a severe reaction to latex? no    Vaccine information given and explained to patient? yes    Are you currently pregnant? no    Lot Number:[id number]   Exp Date:04/27/2011   Site Given  Left Deltoid IM   Physical Exam  General:  normal appearance and healthy appearing.   Head:  normocephalic and atraumatic.   Eyes:  PERRLA and EOMI.   Ears:  TMs intact and clear with normal canals Nose:  clear drainage, no tenderness Mouth:  no deformity or lesions Neck:  no JVD.   Lungs:  coarse breath sounds, diminished air entry, no wheeze Heart:  normal rate, no murmur, and no gallop.     Impression & Recommendations:  Problem # 1:  CHRONIC FATIGUE SYNDROME (ICD-780.71) Assessment Unchanged follow at [facility] continue the medical protocol could not tolerate the seroquil change to the doxipen liguid  Problem # 2:  HYPOTHYROIDISM (ICD-244.9)  Her updated medication list for this problem includes:    Armour Thyroid 60 Mg Tabs (Thyroid) .[name] Kitchen... 1&1/2  once daily(total 90)  Labs Reviewed: TSH: 1.04 (11/13/2009)   Free T4: 0.5 (11/13/2009)    Chol: 220 (11/19/2006)   HDL: 55.9 (11/19/2006)   LDL: DEL (11/19/2006)   TG: 78 (11/19/2006)  Orders: TLB-TSH (Thyroid Stimulating Hormone) (84443-TSH) Venipuncture (68088)  Problem # 3:  FIBROMYALGIA (ICD-729.1) Assessment: Unchanged  Her updated medication list for this problem includes:    Demerol 50 Mg Tabs (Meperidine hcl) ..... One by mouth q 8  hrs as needed for pain    Carisoprodol 350 Mg Tabs (Carisoprodol) .[name] Kitchen... 1 two times a day as needed    Carisoprodol 350 Mg Tabs (Carisoprodol) ..... One by mouth two times a day  Problem # 4:  ANEMIA, IRON DEFICIENCY (ICD-280.9) Assessment: Unchanged  Her updated medication list for this problem includes:    Cyanocobalamin 1000 Mcg/ml Soln (Cyanocobalamin) .[name] Kitchen... 1 ml twice a week    Slow Release Iron 47.5 Mg Cr-tabs (Ferrous sulfate) ..... One by mouth bid  Hgb: 13.3 (03/20/2010)   Hct: 38.3 (03/20/2010)   Platelets: 243.0 (03/20/2010) RBC: 4.07 (03/20/2010)  RDW: 12.7 (03/20/2010)   WBC: 6.2 (03/20/2010) MCV: 94.2 (03/20/2010)   MCHC: 34.6 (03/20/2010) Iron: 68 (03/20/2010)   % Sat: 16.1 (03/20/2010) B12: 947 (11/13/2009)   Folate: 18.2 (11/13/2009)   TSH: 1.04 (11/13/2009)  Orders: TLB-IBC Pnl (Iron/FE;Transferrin) (83550-IBC) TLB-CBC Platelet - w/Differential (85025-CBCD)  Complete Medication List: 1)  Dulera 100-5 Mcg/act Aero (Mometasone furo-formoterol fum) .... One puff  two times a day 2)  Singulair 10 Mg Tabs (Montelukast sodium) .... Once daily 3)  Proair Hfa 108 (90 Base) Mcg/act Aers (Albuterol sulfate) .... 2 puffs every 4-6 hours as needed 4)  Nasonex 50 Mcg/act Susp (Mometasone furoate) .... Two sprays each nostril once daily as needed 5)  Fluoxetine Hcl 40 Mg Caps (Fluoxetine hcl) .[name] Kitchen.. 1 once daily 6)  Vivelle-dot 0.0375 Mg/24hr Pttw (Estradiol) .... Unsure of dosage/change  2times a week 7)  Ergocalciferol 50000 Unit Caps (Ergocalciferol) .[name] Kitchen.. 1 twice a week 8)  Demerol 50 Mg Tabs (Meperidine hcl) .... One by mouth q 8 hrs as needed for pain 9)  Carisoprodol 350 Mg Tabs (Carisoprodol) .[name] Kitchen.. 1 two times a day as needed 10)  Verapamil Hcl Cr 180 Mg Cr-tabs (Verapamil hcl) .... One by mouth daily 11)  Cyanocobalamin 1000 Mcg/ml Soln (Cyanocobalamin) .[name] Kitchen.. 1 ml twice a week 12)  Armour Thyroid 60 Mg Tabs (Thyroid) .[name] Kitchen.. 1&1/2  once daily(total 90) 13)  Promethazine Hcl 50  Mg Tabs (Promethazine hcl) .... 1/2 every 8 hour as needed nausea 14)  Slow Release Iron 47.5 Mg Cr-tabs (Ferrous sulfate) .... One by mouth bid 15)  Doxepin Hcl 10 Mg/ml Conc (Doxepin hcl) .... 2-5 gtts at bedtime 16)  Carisoprodol 350 Mg Tabs (Carisoprodol) .... One by mouth two times a day 17)  Lunesta 3 Mg Tabs (Eszopiclone) .... One by mouth  at bedtime  Other Orders: Admin 1st Vaccine [id number]) Flu Vaccine 52yr + [phone] T-Vitamin D (25-Hydroxy) [phone]  Patient Instructions: 1)  Please schedule a follow-up appointment in 3 months. Prescriptions: LUNESTA 3 MG TABS (ESZOPICLONE) one by mouth  at bedtime  #30 x 2   Entered and Authorized by:   JRicard DillonMD   Signed by:   JRicard DillonMD on 07/25/2010   Method used:   Print then Mail to Patient   RxID:   [id number] 350 MG TABS (CARISOPRODOL) one by mouth two times a day  #60 x 5   Entered and Authorized by:   JRicard DillonMD   Signed by:   JRicard DillonMD on 07/25/2010   Method used:   Electronically to        [facility][phone] (retail)       [address]      [place]  [address]      Ph: [phone]      Fax: [phone]  RxID:   [id number] HCL CR 180 MG CR-TABS (VERAPAMIL HCL) one by mouth daily  #30 x 11   Entered and Authorized by:   JRicard DillonMD   Signed by:   JRicard DillonMD on 07/25/2010   Method used:   Electronically to        [facility][phone] (retail)       [address]      [place]  [address]      Ph:  [phone]       Fax: [phone]   RxID:   [id number] ARMOUR THYROID 60 MG TABS (THYROID) 1&1/2  once daily(total 90)  #45.0 Tablet x 5   Entered and Authorized by:   Ricard Dillon MD   Signed by:   Ricard Dillon MD on 07/25/2010   Method used:   Electronically to        [facility] [phone]* (retail)       [address]       [place], [place]  [address]       Ph: [phone]       Fax: [phone]   RxID:   [id number]   Appended Document: 2 month ov//ccm/pt req flu shot/cjr

## 2010-11-27 NOTE — Progress Notes (Signed)
Summary: Pt req scripts for Demerol and Promethazine  Phone Note Refill Request Call back at Home Phone [phone] Message from:  Patient on July 11, 2010 11:36 AM  Refills Requested: Medication #1:  DEMEROL 50 MG  TABS one by mouth q 8 hrs as needed for pain   Dosage confirmed as above?Dosage Confirmed   Brand Name Necessary? No   Supply Requested: 1 month  Medication #2:  PROMETHAZINE HCL 50 MG TABS 1/2 every 8 hour as needed nausea   Dosage confirmed as above?Dosage Confirmed   Brand Name Necessary? No   Supply Requested: 1 month  Method Requested: Pick up at Office Initial call taken by: Braulio Bosch,  July 11, 2010 11:36 AM    Prescriptions: PROMETHAZINE HCL 50 MG TABS (PROMETHAZINE HCL) 1/2 every 8 hour as needed nausea  #30 x 0   Entered by:   Allyne Gee, LPN   Authorized by:   Ricard Dillon MD   Signed by:   Allyne Gee, LPN on 91/11/8900   Method used:   Print then Give to Patient   RxID:   [id number] DEMEROL 50 MG  TABS (MEPERIDINE HCL) one by mouth q 8 hrs as needed for pain  #90 x 0   Entered by:   Allyne Gee, LPN   Authorized by:   Ricard Dillon MD   Signed by:   Allyne Gee, LPN on 43/10/4838   Method used:   Print then Give to Patient   RxID:   [id number]

## 2010-11-29 NOTE — Assessment & Plan Note (Signed)
Summary: 3 month fup//ccm   Vital Signs:  Patient profile:   65 year old female Height:      64 inches Weight:      132 pounds BMI:     22.74 Temp:     98.2 degrees F oral Pulse rate:   68 / minute Resp:     14 per minute BP sitting:   130 / 80  (left arm)  Vitals Entered By: Allyne Gee, LPN (October 17, 1609 11:58 AM) CC: roa Is Patient Diabetic? No   Primary Care Provider:  Ricard Dillon MD  CC:  roa.  History of Present Illness: follow  up fibromyalgia had a tramatic injury from a blade on the left hand surgery by Dr Apolonio Schneiders Asthma stable, on the dulera and singular fibromyalgia  stable, there was an increased in pain management needs but better now   Preventive Screening-Counseling & Management  Alcohol-Tobacco     Smoking Status: never     Passive Smoke Exposure: no     Tobacco Counseling: not indicated; no tobacco use  Problems Prior to Update: 1)  Unspecified Vitamin D Deficiency  (ICD-268.9) 2)  Osteoarthritis, Generalized, Hand  (ICD-715.04) 3)  Acute Maxillary Sinusitis  (ICD-461.0) 4)  Palpitations, Recurrent  (ICD-785.1) 5)  Acute Frontal Sinusitis  (ICD-461.1) 6)  Hyperventilation  (ICD-786.01) 7)  Anemia, Iron Deficiency  (ICD-280.9) 8)  Asthma, With Acute Exacerbation  (ICD-493.92) 9)  Allergic Rhinitis  (ICD-477.9) 10)  Thoracic Aortic Aneurysm  (ICD-441.2) 11)  Hypersomnia  (ICD-780.54) 12)  Chest Pain, Left  (ICD-786.50) 13)  Uns Advrs Eff Uns Rx Medicinal&biological Sbstnc  (ICD-995.20) 14)  Pneumonia, Left Lower Lobe  (ICD-481) 15)  Hammer Toe  (ICD-755.66) 16)  Chronic Fatigue Syndrome  (ICD-780.71) 17)  Adj Disorder With Mixed Anxiety & Depressed Mood  (ICD-309.28) 18)  Disc Disease, 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) 19)  Migraine, Classical  (ICD-346.00) 20)  Osteoarthritis  (ICD-715.90) 21)  Fibromyalgia  (ICD-729.1) 22)  Asthma  (ICD-493.90) 23)  Hypothyroidism  (ICD-244.9)  Current Problems (verified): 1)  Unspecified Vitamin D  Deficiency  (ICD-268.9) 2)  Osteoarthritis, Generalized, Hand  (ICD-715.04) 3)  Acute Maxillary Sinusitis  (ICD-461.0) 4)  Palpitations, Recurrent  (ICD-785.1) 5)  Acute Frontal Sinusitis  (ICD-461.1) 6)  Hyperventilation  (ICD-786.01) 7)  Anemia, Iron Deficiency  (ICD-280.9) 8)  Asthma, With Acute Exacerbation  (ICD-493.92) 9)  Allergic Rhinitis  (ICD-477.9) 10)  Thoracic Aortic Aneurysm  (ICD-441.2) 11)  Hypersomnia  (ICD-780.54) 12)  Chest Pain, Left  (ICD-786.50) 13)  Uns Advrs Eff Uns Rx Medicinal&biological Sbstnc  (ICD-995.20) 14)  Pneumonia, Left Lower Lobe  (ICD-481) 15)  Hammer Toe  (ICD-755.66) 16)  Chronic Fatigue Syndrome  (ICD-780.71) 17)  Adj Disorder With Mixed Anxiety & Depressed Mood  (ICD-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 One Puff  Two Times A Day 2)  Singulair 10 Mg  Tabs (Montelukast Sodium) .... Once Daily 3)  Proair Hfa 108 (90 Base) Mcg/act Aers (Albuterol Sulfate) .... 2 Puffs Every 4-6 Hours As Needed 4)  Nasonex 50 Mcg/act Susp (Mometasone Furoate) .... Two Sprays Each Nostril Once Daily As Needed 5)  Fluoxetine Hcl 40 Mg  Caps (Fluoxetine Hcl) .[name] Kitchen.. 1 Once Daily 6)  Vivelle-Dot 0.0375 Mg/24hr  Pttw (  Estradiol) .... Unsure of Dosage/change  2times A Week 7)  Ergocalciferol 50000 Unit Caps (Ergocalciferol) .[name] Kitchen.. 1 Twice A Week 8)  Demerol 50 Mg  Tabs (Meperidine Hcl) .... One By Mouth Q 8 Hrs As Needed For Pain 9)  Carisoprodol 350 Mg  Tabs (Carisoprodol) .[name] Kitchen.. 1 Two Times A Day As Needed 10)  Verapamil Hcl Cr 180 Mg Cr-Tabs (Verapamil Hcl) .... One By Mouth Daily 11)  Cyanocobalamin 1000 Mcg/ml Soln (Cyanocobalamin) .[name] Kitchen.. 1 Ml Twice A Week 12)  Armour Thyroid 60 Mg Tabs (Thyroid) .[name] Kitchen..  1&1/2  Once Daily(Total 90) 13)  Promethazine Hcl 50 Mg Tabs (Promethazine Hcl) .... 1/2 Every 8 Hour As Needed Nausea 14)  Slow Release Iron 47.5 Mg  Cr-Tabs (Ferrous Sulfate) .... One By Mouth Bid 15)  Doxepin Hcl 10 Mg/ml Conc (Doxepin Hcl) .... 2-5 Gtts At Bedtime 16)  Carisoprodol 350 Mg Tabs (Carisoprodol) .... One By Mouth Two Times A Day 17)  Lunesta 3 Mg Tabs (Eszopiclone) .... One By Mouth  At Bedtime  Current Medications (verified): 1)  Dulera 100-5 Mcg/act Aero (Mometasone Furo-Formoterol Fum) .... One Puff  Two Times A Day 2)  Singulair 10 Mg  Tabs (Montelukast Sodium) .... Once Daily 3)  Proair Hfa 108 (90 Base) Mcg/act Aers (Albuterol Sulfate) .... 2 Puffs Every 4-6 Hours As Needed 4)  Nasonex 50 Mcg/act Susp (Mometasone Furoate) .... Two Sprays Each Nostril Once Daily As Needed 5)  Fluoxetine Hcl 40 Mg  Caps (Fluoxetine Hcl) .[name] Kitchen.. 1 Once Daily 6)  Vivelle-Dot 0.0375 Mg/24hr  Pttw (Estradiol) .... Unsure of Dosage/change  2times A Week 7)  Ergocalciferol 50000 Unit Caps (Ergocalciferol) .[name] Kitchen.. 1 Twice A Week 8)  Demerol 50 Mg  Tabs (Meperidine Hcl) .... One By Mouth Q 8 Hrs As Needed For Pain 9)  Carisoprodol 350 Mg  Tabs (Carisoprodol) .[name] Kitchen.. 1 Two Times A Day As Needed 10)  Verapamil Hcl Cr 180 Mg Cr-Tabs (Verapamil Hcl) .... One By Mouth Daily 11)  Cyanocobalamin 1000 Mcg/ml Soln (Cyanocobalamin) .[name] Kitchen.. 1 Ml Twice A Week 12)  Armour Thyroid 60 Mg Tabs (Thyroid) .[name] Kitchen.. 1&1/2  Once Daily(Total 90) 13)  Promethazine Hcl 50 Mg Tabs (Promethazine Hcl) .... 1/2 Every 8 Hour As Needed Nausea 14)  Slow Release Iron 47.5 Mg  Cr-Tabs (Ferrous Sulfate) .... One By Mouth Bid 15)  Doxepin Hcl 10 Mg/ml Conc (Doxepin Hcl) .... 2-5 Gtts At Bedtime 16)  Carisoprodol 350 Mg Tabs (Carisoprodol) .... One By Mouth Two Times A Day 17)  Lunesta 3 Mg Tabs (Eszopiclone) .... One By Mouth  At Bedtime  Allergies (verified): 1)  ! Premarin 2)  ! Feldene 3)  ! Codeine 4)  ! Erythromycin 5)  ! Duragesic-25  (Fentanyl)  Past History:  Family History: Last updated: 12/27/2008 father... leukemia at 39 mother... Family History of Arthritis  Social History: Last updated: 12/27/2008 Married Alcohol use-yes Drug use-no Regular exercise-no  Risk Factors: Exercise: no (07/14/2007)  Risk Factors: Smoking Status: never (10/17/2010) Passive Smoke Exposure: no (10/17/2010)  Past medical, surgical, family and social histories (including risk factors) reviewed, and no changes noted (except as noted below).  Past Medical History: Reviewed history from 04/18/2009 and no changes required. Hypothyroidism Asthma      - PFT 02/01/09 FEV1 2.71 (125%), FVC 3.47 (117%), FEV1% 78, TLC 5.29 (109%), DLCO 98%, +BD Chronic fatigue syndrom Mitral valve prolapse  Migraine Headaches Interstitial cystitis Dysthymia Osteoarthritis 4cm ascending aortic dilation      - From CT chest 11/29/08  Past Surgical History: Reviewed history from 02/01/2009 and no changes required. Hysterectomy Tonsillectomy Sinus surgery Hand surgery  Foot surgery Dr. Eleanora Neighbor 2009/2010  Family History: Reviewed history from 12/27/2008 and no changes required. father... leukemia at 8 mother... Family History of Arthritis  Social History: Reviewed history from 12/27/2008 and no changes required. Married Alcohol use-yes Drug use-no Regular exercise-no  Review of Systems  The patient denies anorexia, fever, weight loss, weight gain, vision loss, decreased hearing, hoarseness, chest pain, syncope, dyspnea on exertion, peripheral edema, prolonged cough, headaches, hemoptysis, abdominal pain, melena, hematochezia, severe indigestion/heartburn, hematuria, incontinence, genital sores, muscle weakness, suspicious skin lesions, transient blindness, difficulty walking, depression, unusual weight change, abnormal bleeding, enlarged lymph nodes, angioedema, and breast masses.    Physical Exam  General:  normal appearance  and healthy appearing.   Head:  normocephalic and atraumatic.   Eyes:  PERRLA and EOMI.   Ears:  TMs intact and clear with normal canals Nose:  clear drainage, no tenderness Mouth:  no deformity or lesions Neck:  no JVD.   Lungs:  coarse breath sounds, diminished air entry, no wheeze Heart:  normal rate, no murmur, and no gallop.   Abdomen:  Bowel sounds positive,abdomen soft and non-tender without masses, organomegaly or hernias noted.   Impression & Recommendations:  Problem # 1:  ANEMIA, IRON DEFICIENCY (ICD-280.9) Assessment Improved  Her updated medication list for this problem includes:    Cyanocobalamin 1000 Mcg/ml Soln (Cyanocobalamin) .[name] Kitchen... 1 ml twice a week    Slow Release Iron 47.5 Mg Cr-tabs (Ferrous sulfate) ..... One by mouth bid  Hgb: 13.2 (07/25/2010)   Hct: 38.1 (07/25/2010)   Platelets: 233.0 (07/25/2010) RBC: 4.02 (07/25/2010)   RDW: 13.1 (07/25/2010)   WBC: 6.5 (07/25/2010) MCV: 94.8 (07/25/2010)   MCHC: 34.6 (07/25/2010) Iron: 70 (07/25/2010)   % Sat: 15.6 (07/25/2010) B12: 947 (11/13/2009)   Folate: 18.2 (11/13/2009)   TSH: 0.79 (07/25/2010)  Problem # 2:  FIBROMYALGIA (ICD-729.1) Assessment: Unchanged  Her updated medication list for this problem includes:    Demerol 50 Mg Tabs (Meperidine hcl) ..... One by mouth q 8 hrs as needed for pain    Carisoprodol 350 Mg Tabs (Carisoprodol) .[name] Kitchen... 1 two times a day as needed    Carisoprodol 350 Mg Tabs (Carisoprodol) ..... One by mouth two times a day  Problem # 3:  OSTEOARTHRITIS (ICD-715.90)  Her updated medication list for this problem includes:    Demerol 50 Mg Tabs (Meperidine hcl) ..... One by mouth q 8 hrs as needed for pain  Discussed use of medications, application of heat or cold, and exercises.   Problem # 4:  HYPOTHYROIDISM (ICD-244.9)  Her updated medication list for this problem includes:    Armour Thyroid 60 Mg Tabs (Thyroid) .[name] Kitchen... 1&1/2  once daily(total 90)  Labs Reviewed: TSH: 0.79  (07/25/2010)   Free T4: 0.5 (11/13/2009)    Chol: 220 (11/19/2006)   HDL: 55.9 (11/19/2006)   LDL: DEL (11/19/2006)   TG: 78 (11/19/2006)  Orders: Specimen Handling (99000) Venipuncture (16109) TLB-TSH (Thyroid Stimulating Hormone) (84443-TSH) TLB-Cholesterol, HDL (83718-HDL) TLB-Cholesterol, Direct LDL (83721-DIRLDL) TLB-Cholesterol, Total (82465-CHO) TLB-Triglycerides (84478-TRG)  Complete Medication List: 1)  Dulera 100-5 Mcg/act Aero (Mometasone furo-formoterol fum) .... One puff  two times a day 2)  Singulair 10 Mg Tabs (Montelukast sodium) .... Once daily 3)  Proair Hfa 108 (90 Base) Mcg/act Aers (Albuterol sulfate) .... 2 puffs every 4-6 hours as needed 4)  Nasonex 50 Mcg/act Susp (Mometasone furoate) .... Two sprays each nostril  once daily as needed 5)  Fluoxetine Hcl 40 Mg Caps (Fluoxetine hcl) .[name] Kitchen.. 1 once daily 6)  Vivelle-dot 0.0375 Mg/24hr Pttw (Estradiol) .... Unsure of dosage/change  2times a week 7)  Ergocalciferol 50000 Unit Caps (Ergocalciferol) .[name] Kitchen.. 1 twice a week 8)  Demerol 50 Mg Tabs (Meperidine hcl) .... One by mouth q 8 hrs as needed for pain 9)  Carisoprodol 350 Mg Tabs (Carisoprodol) .[name] Kitchen.. 1 two times a day as needed 10)  Verapamil Hcl Cr 180 Mg Cr-tabs (Verapamil hcl) .... One by mouth daily 11)  Cyanocobalamin 1000 Mcg/ml Soln (Cyanocobalamin) .[name] Kitchen.. 1 ml twice a week 12)  Armour Thyroid 60 Mg Tabs (Thyroid) .[name] Kitchen.. 1&1/2  once daily(total 90) 13)  Promethazine Hcl 50 Mg Tabs (Promethazine hcl) .... 1/2 every 8 hour as needed nausea 14)  Slow Release Iron 47.5 Mg Cr-tabs (Ferrous sulfate) .... One by mouth bid 15)  Doxepin Hcl 10 Mg/ml Conc (Doxepin hcl) .... 2-5 gtts at bedtime 16)  Carisoprodol 350 Mg Tabs (Carisoprodol) .... One by mouth two times a day 17)  Lunesta 3 Mg Tabs (Eszopiclone) .... One by mouth  at bedtime  Patient Instructions: 1)  Please schedule a follow-up appointment in 3 months. Prescriptions: LUNESTA 3 MG TABS (ESZOPICLONE) one by mouth   at bedtime  #30 x 6   Entered and Authorized by:   Ricard Dillon MD   Signed by:   Ricard Dillon MD on 10/17/2010   Method used:   Print then Give to Patient   RxID:   [id number] VERAPAMIL HCL CR 180 MG CR-TABS (VERAPAMIL HCL) one by mouth daily  #30 Tablet x 10   Entered and Authorized by:   Ricard Dillon MD   Signed by:   Ricard Dillon MD on 10/17/2010   Method used:   Electronically to        [facility] [phone]* (retail)       [address]       [place], [place]  [address]       Ph: [phone]       Fax: [phone]   RxID:   [id number] FLUOXETINE HCL 40 MG  CAPS (FLUOXETINE HCL) 1 once daily  #30 Capsule x 6   Entered and Authorized by:   Ricard Dillon MD   Signed by:   Ricard Dillon MD on 10/17/2010   Method used:   Electronically to        [facility] [phone]* (retail)       [address]       [place], [place]  [address]       Ph: [phone]       Fax: [phone]   RxID:   [id number]    Orders Added: 1)  Est. Patient Level IV [82641] 2)  Specimen Handling [99000] 3)  Venipuncture [58309] 4)  TLB-TSH (Thyroid Stimulating Hormone) [84443-TSH] 5)  TLB-Cholesterol, HDL [83718-HDL] 6)  TLB-Cholesterol, Direct LDL [83721-DIRLDL] 7)  TLB-Cholesterol, Total [82465-CHO] 8)  TLB-Triglycerides [40768-GSU]

## 2010-11-30 NOTE — Letter (Signed)
Summary: [facility]   Imported By: Laural Benes 02/28/2010 12:58:37  _____________________________________________________________________  External Attachment:    Type:   Image     Comment:   External Document

## 2010-12-04 ENCOUNTER — Ambulatory Visit: Payer: Medicare Other | Attending: Orthopedic Surgery | Admitting: Occupational Therapy

## 2010-12-04 DIAGNOSIS — M25549 Pain in joints of unspecified hand: Secondary | ICD-10-CM | POA: Insufficient documentation

## 2010-12-04 DIAGNOSIS — IMO0001 Reserved for inherently not codable concepts without codable children: Secondary | ICD-10-CM | POA: Insufficient documentation

## 2010-12-04 DIAGNOSIS — M6281 Muscle weakness (generalized): Secondary | ICD-10-CM | POA: Insufficient documentation

## 2010-12-04 DIAGNOSIS — M2560 Stiffness of unspecified joint, not elsewhere classified: Secondary | ICD-10-CM | POA: Insufficient documentation

## 2010-12-10 ENCOUNTER — Other Ambulatory Visit: Payer: Self-pay | Admitting: Internal Medicine

## 2010-12-11 ENCOUNTER — Encounter: Payer: Self-pay | Admitting: Occupational Therapy

## 2011-01-07 ENCOUNTER — Telehealth: Payer: Self-pay | Admitting: Internal Medicine

## 2011-01-07 DIAGNOSIS — L719 Rosacea, unspecified: Secondary | ICD-10-CM

## 2011-01-07 MED ORDER — METRONIDAZOLE 1 % EX GEL: Freq: Every day | CUTANEOUS | Status: DC

## 2011-01-07 NOTE — Telephone Encounter (Signed)
Sent!

## 2011-01-07 NOTE — Telephone Encounter (Signed)
Pt is requesting metrogel for rosacea call into [facility] [phone]

## 2011-01-08 LAB — BASIC METABOLIC PANEL
BUN: 10 mg/dL (ref 6–23)
CO2: 25 mEq/L (ref 19–32)
Chloride: 97 mEq/L (ref 96–112)
Creatinine, Ser: 0.59 mg/dL (ref 0.4–1.2)

## 2011-01-14 ENCOUNTER — Other Ambulatory Visit: Payer: Self-pay | Admitting: Thoracic Surgery (Cardiothoracic Vascular Surgery)

## 2011-01-14 DIAGNOSIS — I712 Thoracic aortic aneurysm, without rupture: Secondary | ICD-10-CM

## 2011-01-17 ENCOUNTER — Encounter: Payer: Self-pay | Admitting: Internal Medicine

## 2011-01-18 ENCOUNTER — Encounter: Payer: Self-pay | Admitting: Internal Medicine

## 2011-01-18 ENCOUNTER — Ambulatory Visit (INDEPENDENT_AMBULATORY_CARE_PROVIDER_SITE_OTHER): Payer: Medicare Other | Admitting: Internal Medicine

## 2011-01-18 VITALS — BP 130/80 | HR 76 | Temp 98.2°F | Resp 14 | Ht 64.5 in | Wt 135.0 lb

## 2011-01-18 DIAGNOSIS — G43909 Migraine, unspecified, not intractable, without status migrainosus: Secondary | ICD-10-CM

## 2011-01-18 DIAGNOSIS — D509 Iron deficiency anemia, unspecified: Secondary | ICD-10-CM

## 2011-01-18 DIAGNOSIS — R5382 Chronic fatigue, unspecified: Secondary | ICD-10-CM

## 2011-01-18 DIAGNOSIS — J309 Allergic rhinitis, unspecified: Secondary | ICD-10-CM

## 2011-01-18 DIAGNOSIS — G43109 Migraine with aura, not intractable, without status migrainosus: Secondary | ICD-10-CM

## 2011-01-18 DIAGNOSIS — I712 Thoracic aortic aneurysm, without rupture: Secondary | ICD-10-CM

## 2011-01-18 DIAGNOSIS — E039 Hypothyroidism, unspecified: Secondary | ICD-10-CM

## 2011-01-18 DIAGNOSIS — E559 Vitamin D deficiency, unspecified: Secondary | ICD-10-CM

## 2011-01-18 DIAGNOSIS — IMO0001 Reserved for inherently not codable concepts without codable children: Secondary | ICD-10-CM

## 2011-01-18 LAB — IRON: Iron: 73 ug/dL (ref 42–145)

## 2011-01-18 LAB — CBC WITH DIFFERENTIAL/PLATELET
Basophils Absolute: 0 10*3/uL (ref 0.0–0.1)
Eosinophils Absolute: 0.1 10*3/uL (ref 0.0–0.7)
Lymphocytes Relative: 17.1 % (ref 12.0–46.0)
MCHC: 35.3 g/dL (ref 30.0–36.0)
Neutrophils Relative %: 74.8 % (ref 43.0–77.0)
Platelets: 237 10*3/uL (ref 150.0–400.0)
RDW: 12.9 % (ref 11.5–14.6)

## 2011-01-18 LAB — VITAMIN B12: Vitamin B-12: >1500 pg/mL — ABNORMAL HIGH (ref 211–911)

## 2011-01-18 MED ORDER — MEPERIDINE HCL 50 MG PO TABS: 50.0000 mg | ORAL_TABLET | Freq: Three times a day (TID) | ORAL | Status: DC | PRN

## 2011-01-18 NOTE — Assessment & Plan Note (Signed)
Depo for allergic flair With allergic arthritis

## 2011-01-18 NOTE — Assessment & Plan Note (Signed)
She has an appointment with cardiothoracic surgery with an MRA prior to that appointment in May

## 2011-01-18 NOTE — Progress Notes (Signed)
Subjective:    Patient ID: Marissa [name], female    DOB: [birth year], 65 y.o.   MRN: [id number]  HPI   the patient is a 65 year old white female with fibromyalgia asthma and hypertension who presents for followup of her chronic pain her height hypertension and her fibromyalgia.  Her asthma has been stable she has not had any flares requiring the use of rescue inhalers her allergies have flared it up to you to the increased pollen her blood pressure has been stable her pain has increased and she has required Demerol recently due to the increased pain she feels this is more fibromyalgia.  She has an appointment with the thoracic surgeon next month with an MRA prior to it  Review of Systems  Constitutional: Negative for activity change, appetite change and fatigue.  HENT: Negative for ear pain, congestion, neck pain, postnasal drip and sinus pressure.   Eyes: Negative for redness and visual disturbance.  Respiratory: Negative for cough, shortness of breath and wheezing.   Gastrointestinal: Negative for abdominal pain and abdominal distention.  Genitourinary: Negative for dysuria, frequency and menstrual problem.  Musculoskeletal: Negative for myalgias, joint swelling and arthralgias.  Skin: Negative for rash and wound.  Neurological: Negative for dizziness, weakness and headaches.  Hematological: Negative for adenopathy. Does not bruise/bleed easily.  Psychiatric/Behavioral: Negative for sleep disturbance and decreased concentration.   Past Medical History  Diagnosis Date  . Thyroid disease   . Asthma   . MVP (mitral valve prolapse)   . Migraine   . Dysthymia   . Dysthymia   . Arthritis    Past Surgical History  Procedure Date  . Abdominal hysterectomy   . Tonsilectomy, adenoidectomy, bilateral myringotomy and tubes   . Sinus irrigation   . Hand surgery   . Foot surgery     reports that she has never smoked. She does not have any smokeless tobacco history on file. She reports  that she does not drink alcohol or use illicit drugs. family history includes Arthritis in her mother and Leukemia in her father. Allergies  Allergen Reactions  . Codeine     REACTION: makes her hyper  . Conjugated Estrogens   . Erythromycin     REACTION: had a rash with emycin, has done ok with other meds in it's class  . Fentanyl     REACTION: resp distress, rash  . Piroxicam        Objective:   Physical Exam  Constitutional: She is oriented to person, place, and time. She appears well-developed and well-nourished. No distress.  HENT:  Head: Normocephalic and atraumatic.  Right Ear: External ear normal.  Left Ear: External ear normal.  Nose: Nose normal.  Mouth/Throat: Oropharynx is clear and moist.  Eyes: Conjunctivae and EOM are normal. Pupils are equal, round, and reactive to light.  Neck: Normal range of motion. Neck supple. No JVD present. No tracheal deviation present. No thyromegaly present.  Cardiovascular: Normal rate, regular rhythm, normal heart sounds and intact distal pulses.   No murmur heard. Pulmonary/Chest: Effort normal and breath sounds normal. She has no wheezes. She exhibits no tenderness.  Abdominal: Soft. Bowel sounds are normal.  Musculoskeletal: Normal range of motion. She exhibits no edema and no tenderness.  Lymphadenopathy:    She has no cervical adenopathy.  Neurological: She is alert and oriented to person, place, and time. She has normal reflexes. No cranial nerve deficit.  Skin: Skin is warm and dry. She is not diaphoretic.  Psychiatric: She has  a normal mood and affect. Her behavior is normal.          Assessment & Plan:

## 2011-01-18 NOTE — Assessment & Plan Note (Signed)
Increased pain in pressure point and interference with daily functioning

## 2011-01-18 NOTE — Assessment & Plan Note (Signed)
monitoring of CBC

## 2011-01-18 NOTE — Assessment & Plan Note (Signed)
Increased frequency possible due to increased left neck pain and allergies

## 2011-01-19 LAB — VITAMIN D 25 HYDROXY (VIT D DEFICIENCY, FRACTURES): Vit D, 25-Hydroxy: 65 ng/mL (ref 30–89)

## 2011-01-28 ENCOUNTER — Ambulatory Visit
Admission: RE | Admit: 2011-01-28 | Discharge: 2011-01-28 | Disposition: A | Discharge status: Home / Self Care | Payer: Medicare Other | Source: Ambulatory Visit | Attending: Thoracic Surgery (Cardiothoracic Vascular Surgery) | Admitting: Thoracic Surgery (Cardiothoracic Vascular Surgery)

## 2011-01-28 ENCOUNTER — Encounter (INDEPENDENT_AMBULATORY_CARE_PROVIDER_SITE_OTHER): Payer: Medicare Other | Admitting: Thoracic Surgery (Cardiothoracic Vascular Surgery)

## 2011-01-28 DIAGNOSIS — I712 Thoracic aortic aneurysm, without rupture: Secondary | ICD-10-CM

## 2011-01-28 MED ORDER — GADOBENATE DIMEGLUMINE 529 MG/ML IV SOLN
12.0000 mL | Freq: Once | INTRAVENOUS | Status: AC | PRN
  Administered 2011-01-28: 12 mL via INTRAVENOUS

## 2011-01-29 NOTE — Assessment & Plan Note (Signed)
OFFICE VISIT  ASPYN, WARNKE [facility] DOB:  1946-04-05                                        January 28, 2011 CHART #:  [id number]  REASON FOR VISIT:  One-year followup of ascending aortic aneurysm.  HISTORY OF PRESENT ILLNESS:  The patient is a 65 year old woman with multiple medical problems who a year ago was found to have a 4.2-cm ascending aortic aneurysm.  This was found incidentally on a CT scan that was done for other reasons.  She was seen in the office on January 30, 2010.  At that time, she was having some trouble with some shortness of breath and also is feeling tired.  She does have chronic fatigue syndrome, COPD, as well as hypertension and mitral valve prolapse.  She states that she has been doing better recently.  She still does have those symptoms but they are not as frequent or severe.  Really overall she is feeling quite well.  She has not had any chest pain.  PAST MEDICAL HISTORY:  Significant for chronic fatigue syndrome, hypertension, hyperlipidemia, mitral valve prolapse, fibromyalgia, chronic fatigue, COPD, migraines, interstitial cystitis, and hypothyroidism.  CURRENT MEDICATIONS: 1. Singulair 10 mg daily. 2. Doxepin hydrochloride. 3. ProAir 2 puffs q.4 h. p.r.n. 4. Nasonex nasal spray. 5. Lunesta 3 mg at bedtime. 6. Fluoxetine 40 mg daily. 7. Vitamin D 50,000 units twice weekly. 8. Carisoprodol 350 mg b.i.d. p.r.n. 9. Armour Thyroid 90 mg daily. 10.Iron 47.5 mg b.i.d. 11.Verapamil 180 mg daily.  ALLERGIES:  She has allergies to Premarin, Feldene, codeine, and erythromycin.  PHYSICAL EXAMINATION:  The patient is a well-appearing 65 year old woman in no acute distress.  Her blood pressure is 140/75, pulse 88, respirations are 18, her oxygen saturation is 98% on room air. Neurologically, she is alert and oriented x3 with no deficits.  Her neck is supple without thyromegaly, adenopathy, or bruits.  Her cardiac exam has a regular rate  and rhythm.  Normal S1 and S2.  No rubs or murmurs. Lungs are clear with equal breath sounds bilaterally.  IMAGING:  MR angio of the chest is reviewed and compared to CT scan from April 2011.  There has been no interval change in her ascending aorta which is about 4.2-4.3 cm in maximal diameter.  IMPRESSION:  The patient is a 65 year old woman with a stable ascending aortic aneurysm, measures 4.2-4.3 cm.  It is asymptomatic and unchanged in size.  No indication for surgery at this time.  She is continued to be followed at yearly intervals.  We will plan to do a MR angio in a year.  Revonda Standard Roxan Hockey, M.D. Electronically Signed  SCH/MEDQ  D:  01/28/2011  T:  01/29/2011  Job:  [id number]  cc:   Ricard Dillon, MD Chesley Mires, MD

## 2011-02-05 ENCOUNTER — Encounter: Payer: Self-pay | Admitting: Internal Medicine

## 2011-02-05 ENCOUNTER — Ambulatory Visit (INDEPENDENT_AMBULATORY_CARE_PROVIDER_SITE_OTHER): Payer: Medicare Other | Admitting: Internal Medicine

## 2011-02-05 DIAGNOSIS — M1990 Unspecified osteoarthritis, unspecified site: Secondary | ICD-10-CM

## 2011-02-05 DIAGNOSIS — J309 Allergic rhinitis, unspecified: Secondary | ICD-10-CM

## 2011-02-05 DIAGNOSIS — J45909 Unspecified asthma, uncomplicated: Secondary | ICD-10-CM

## 2011-02-05 MED ORDER — METHYLPREDNISOLONE ACETATE 80 MG/ML IJ SUSP
80.0000 mg | Freq: Once | INTRAMUSCULAR | Status: AC
  Administered 2011-02-05: 80 mg via INTRAMUSCULAR

## 2011-02-05 NOTE — Patient Instructions (Signed)
Get plenty of rest, Drink lots of  clear liquids, and use Tylenol or ibuprofen for fever and discomfort.    Call or return to clinic prn if these symptoms worsen or fail to improve as anticipated.

## 2011-02-05 NOTE — Progress Notes (Signed)
  Subjective:    Patient ID: Marissa [name], female    DOB: [birth year], 65 y.o.   MRN: [id number]  HPI  65 year old patient who is seen today for followup. She has a history of allergic rhinitis and asthma. Complaints today include increasing sinus pressure headache and postnasal drip she feels generally unwell and also complains of minimally productive cough. Her asthma has been stable. She has required only rare albuterol. She has been using Nasonex Claritin-D nasal saline irrigation as well as humidified air without much benefit. She denies any fever or significant purulent drainage  Review of Systems  Constitutional: Positive for fatigue. Negative for fever and chills.  HENT: Positive for congestion, rhinorrhea, neck pain, neck stiffness and postnasal drip. Negative for hearing loss, sore throat, dental problem, sinus pressure and tinnitus.   Eyes: Negative for pain, discharge and visual disturbance.  Respiratory: Positive for cough. Negative for shortness of breath.   Cardiovascular: Negative for chest pain, palpitations and leg swelling.  Gastrointestinal: Negative for nausea, vomiting, abdominal pain, diarrhea, constipation, blood in stool and abdominal distention.  Genitourinary: Negative for dysuria, urgency, frequency, hematuria, flank pain, vaginal bleeding, vaginal discharge, difficulty urinating, vaginal pain and pelvic pain.  Musculoskeletal: Negative for joint swelling, arthralgias and gait problem.  Skin: Negative for rash.  Neurological: Negative for dizziness, syncope, speech difficulty, weakness, numbness and headaches.  Hematological: Negative for adenopathy.  Psychiatric/Behavioral: Negative for behavioral problems, dysphoric mood and agitation. The patient is not nervous/anxious.        Objective:   Physical Exam  Constitutional: She is oriented to person, place, and time. She appears well-developed and well-nourished.  HENT:  Head: Normocephalic and atraumatic.  Right  Ear: External ear normal.  Left Ear: External ear normal.  Nose: Nose normal.  Mouth/Throat: Oropharynx is clear and moist. No oropharyngeal exudate.  Eyes: Conjunctivae and EOM are normal. Pupils are equal, round, and reactive to light.  Neck: Normal range of motion. Neck supple. No thyromegaly present.  Cardiovascular: Normal rate, regular rhythm and normal heart sounds.   Pulmonary/Chest: Effort normal and breath sounds normal. No respiratory distress. She has no wheezes.  Abdominal: She exhibits no mass. There is no tenderness.  Musculoskeletal: Normal range of motion.  Lymphadenopathy:    She has no cervical adenopathy.  Neurological: She is alert and oriented to person, place, and time.  Skin: Skin is warm and dry. No rash noted.  Psychiatric: She has a normal mood and affect. Her behavior is normal.          Assessment & Plan:  Allergic rhinitis- will continue aggressive present regimen. We'll treat with Depo-Medrol 80 mg IM Asthma. Stable

## 2011-02-12 ENCOUNTER — Ambulatory Visit: Payer: Medicare Other | Admitting: Internal Medicine

## 2011-02-25 ENCOUNTER — Other Ambulatory Visit: Payer: Self-pay | Admitting: *Deleted

## 2011-02-25 MED ORDER — CARISOPRODOL 350 MG PO TABS: 350.0000 mg | ORAL_TABLET | Freq: Two times a day (BID) | ORAL | Status: DC

## 2011-03-07 ENCOUNTER — Encounter: Payer: Self-pay | Admitting: Internal Medicine

## 2011-03-07 ENCOUNTER — Ambulatory Visit (INDEPENDENT_AMBULATORY_CARE_PROVIDER_SITE_OTHER): Payer: Medicare Other | Admitting: Internal Medicine

## 2011-03-07 VITALS — BP 130/80 | HR 72 | Temp 98.1°F | Resp 14 | Ht 64.0 in | Wt 133.0 lb

## 2011-03-07 DIAGNOSIS — IMO0001 Reserved for inherently not codable concepts without codable children: Secondary | ICD-10-CM

## 2011-03-07 DIAGNOSIS — G43909 Migraine, unspecified, not intractable, without status migrainosus: Secondary | ICD-10-CM

## 2011-03-07 DIAGNOSIS — G43109 Migraine with aura, not intractable, without status migrainosus: Secondary | ICD-10-CM

## 2011-03-07 MED ORDER — THYROID 60 MG PO TABS: 90.0000 mg | ORAL_TABLET | Freq: Every day | ORAL | Status: DC

## 2011-03-07 MED ORDER — MEPERIDINE HCL 50 MG PO TABS: 50.0000 mg | ORAL_TABLET | Freq: Three times a day (TID) | ORAL | Status: DC | PRN

## 2011-03-07 MED ORDER — ERGOCALCIFEROL 1.25 MG (50000 UT) PO CAPS: 50000.0000 [IU] | ORAL_CAPSULE | ORAL | Status: DC

## 2011-03-07 MED ORDER — MOMETASONE FUROATE 50 MCG/ACT NA SUSP: 2.0000 | Freq: Every day | NASAL | Status: DC

## 2011-03-07 MED ORDER — TAPENTADOL HCL 75 MG PO TABS: 1.0000 | ORAL_TABLET | Freq: Every day | ORAL | Status: DC

## 2011-03-07 MED ORDER — ALBUTEROL SULFATE HFA 108 (90 BASE) MCG/ACT IN AERS: 2.0000 | INHALATION_SPRAY | Freq: Four times a day (QID) | RESPIRATORY_TRACT | Status: DC | PRN

## 2011-03-07 MED ORDER — ESZOPICLONE 3 MG PO TABS: 3.0000 mg | ORAL_TABLET | Freq: Every day | ORAL | Status: DC

## 2011-03-07 NOTE — Assessment & Plan Note (Signed)
Patient's fibromyalgia pain as well as musculoskeletal pain has increased she has increasing number of headaches.  She is currently on Demerol and Phenergan but has not used them as often as she should due to the side effects of the medications a longer acting medication such as the center would be warranted for a trial to see if we can get better pain control without the episodic sedation of Demerol

## 2011-03-07 NOTE — Progress Notes (Signed)
Subjective:    Patient ID: Marissa [name], female    DOB: [birth year], 65 y.o.   MRN: [id number]  HPI Increased HA patter with nausea. This has resulted in flair of fibromyalgia pain Increased fatigue. Sleep pattern is poor. Using ice for joint and knee pain   Review of Systems  Constitutional: Positive for activity change. Negative for appetite change and fatigue.  HENT: Negative for ear pain, congestion, neck pain, postnasal drip and sinus pressure.   Eyes: Negative for redness and visual disturbance.  Respiratory: Negative for cough, shortness of breath and wheezing.   Gastrointestinal: Negative for abdominal pain and abdominal distention.  Genitourinary: Negative for dysuria, frequency and menstrual problem.  Musculoskeletal: Positive for myalgias, joint swelling and arthralgias.  Skin: Negative for rash and wound.  Neurological: Positive for headaches. Negative for dizziness and weakness.  Hematological: Negative for adenopathy. Does not bruise/bleed easily.  Psychiatric/Behavioral: Negative for sleep disturbance and decreased concentration.   Past Medical History  Diagnosis Date  . Thyroid disease   . Asthma   . MVP (mitral valve prolapse)   . Migraine   . Dysthymia   . Dysthymia   . Arthritis    Past Surgical History  Procedure Date  . Abdominal hysterectomy   . Tonsilectomy, adenoidectomy, bilateral myringotomy and tubes   . Sinus irrigation   . Hand surgery   . Foot surgery     reports that she has never smoked. She does not have any smokeless tobacco history on file. She reports that she does not drink alcohol or use illicit drugs. family history includes Arthritis in her mother and Leukemia in her father. Allergies  Allergen Reactions  . Codeine     REACTION: makes her hyper  . Conjugated Estrogens   . Erythromycin     REACTION: had a rash with emycin, has done ok with other meds in it's class  . Fentanyl     REACTION: resp distress, rash  . Piroxicam         Objective:   Physical Exam  Constitutional: She is oriented to person, place, and time. She appears well-developed and well-nourished. No distress.  HENT:  Head: Normocephalic and atraumatic.  Right Ear: External ear normal.  Left Ear: External ear normal.  Nose: Nose normal.  Mouth/Throat: Oropharynx is clear and moist.  Eyes: Conjunctivae and EOM are normal. Pupils are equal, round, and reactive to light.  Neck: Normal range of motion. Neck supple. No JVD present. No tracheal deviation present. No thyromegaly present.  Cardiovascular: Normal rate, regular rhythm, normal heart sounds and intact distal pulses.   No murmur heard. Pulmonary/Chest: Effort normal and breath sounds normal. She has no wheezes. She exhibits no tenderness.  Abdominal: Soft. Bowel sounds are normal.  Musculoskeletal: Normal range of motion. She exhibits tenderness. She exhibits no edema.  Lymphadenopathy:    She has no cervical adenopathy.  Neurological: She is alert and oriented to person, place, and time. She has normal reflexes. No cranial nerve deficit. She exhibits abnormal muscle tone.  Skin: Skin is warm and dry. No rash noted. She is not diaphoretic.  Psychiatric: She has a normal mood and affect. Her behavior is normal.          Assessment & Plan:  Increased pain due to fibromyagia Change for demerol to nucenta Long discussion of control of pain and fibromyalgia and the effect of pain on sleep hygiene as well as lungs are capacity  I have spent more than 30 minutes examining  this patient face-to-face of which over half was spent in counseling

## 2011-03-11 ENCOUNTER — Telehealth: Payer: Self-pay | Admitting: *Deleted

## 2011-03-11 MED ORDER — MEPERIDINE HCL 50 MG PO TABS: 50.0000 mg | ORAL_TABLET | Freq: Three times a day (TID) | ORAL | Status: DC | PRN

## 2011-03-11 NOTE — Telephone Encounter (Addendum)
Pt. Cannot take the Nucynta, makes her nauseated,  and it not relieving her pain.  She wants to go back to Meperidine.  Only got 30 pills due to insurance.  Makes her feel "high".

## 2011-03-11 NOTE — Telephone Encounter (Signed)
May resume the meperidine at the former dose

## 2011-03-11 NOTE — Telephone Encounter (Signed)
Pt.notified

## 2011-03-12 NOTE — Telephone Encounter (Signed)
Ready for pick up

## 2011-03-12 NOTE — Op Note (Signed)
NAME:  Marissa [name], RASNIC NO.:  [id number]   MEDICAL RECORD NO.:  [id number]          PATIENT TYPE:  AMB   LOCATION:  [place]                          FACILITY:  [facility]   PHYSICIAN:  Weber Cooks, M.D.     DATE OF BIRTH:  [birth year]   DATE OF PROCEDURE:  08/02/2008  DATE OF DISCHARGE:                               OPERATIVE REPORT   PREOPERATIVE DIAGNOSES:  1. Right hypermobile first ray.  2. Right hallux valgus.  3. Right second and third metatarsalgia.  4. Right second and third hammertoes.   POSTOPERATIVE DIAGNOSES:  1. Right hypermobile first ray.  2. Right hallux valgus.  3. Right second and third metatarsalgia.  4. Right second and third hammertoes.   OPERATIONS:  1. Right first tarsometatarsal joint fusion with osteotomy.  2. Right local bone graft.  3. Stress x-rays, right foot.  4. Right modified McBride bunionectomy.  5. Right great toe digital nerve neurolysis.  6. Right second and third Weil metatarsal shortening osteotomy.  7. Right second and third toes metatarsophalangeal joint dorsal      capsulotomies and collateral releases.  8. Right second and third toes proximal phalanx head resections.  9. Right second and third toes extensor digitorum brevis to extensor      pollicis longus tendon transfer.  10.Right second and third toes flexor digitorum longus to proximal      phalanx tendon transfers.   ANESTHESIA:  General.   SURGEON:  Weber Cooks, MD   ASSISTANT:  Erskine Emery, PA-C   ESTIMATED BLOOD LOSS:  Minimal.   TOURNIQUET TIME:  2 hours.   COMPLICATIONS:  None.   DISPOSITION:  Stable to PR.   INDICATIONS:  This is a 65 year old female who has had the above  pathology to her foot.  She had hypermobility of the first ray,  developed metatarsus primus varus, and hallux valgus deformity.  She has  hypermobility through the first TMT joint with instability.  She was  consented to the above procedure.  All risks which include  infection,  nerve or vessel injury, nonunion, malunion, hardware irritation,  hardware failure, persistent pain, worsening pain, prolonged recovery,  stiffness, arthritis, recurrence of hallux valgus deformity, development  of hallux varus, cock-up toe deformity of second or third toes,  recurrence of a cock-up toe deformity were all explained, questions were  encouraged and answered.   OPERATION:  The patient was brought to the operating room, placed in a  supine position after adequate general endotracheal tube anesthesia was  administered as well as Ancef 1 g IV piggyback.  Right lower extremity  was then prepped and draped in a sterile manner over proximally placed  thigh tourniquet, limb was gravity exsanguinated.  Tourniquet was  elevated to 290 mmHg.  A longitudinal incision in the midline over the  medial aspect right great toe MTP joint was then made.  Dissection was  carried down through the skin.  Hemostasis was obtained.  Great toe  digital nerve was then on the dorsomedial aspect was carefully dissected  out, freed, and protected throughout the  case.  A formal digital nerve  neurolysis was performed.  Once this was freed up, we then performed an  L-shaped capsulotomy.  Simple bunionectomy was performed with a sagittal  saw.  Yvone Neu Johnson's ridge was then rounded off with a rongeur.  Lateral  capsule was then released with a curved Beaver blade protecting the  cartilage with a Soil scientist.  This had an excellent release of the  lateral capsule.  The area of joint was copiously irrigated with normal  saline.  We then did not reconstruct the medial capsule.  The first TMT  joint was fused.  We then made a longitudinal incision in midline  between EHL and EHB.  Dissection was carried down through the skin.  Hemostasis was obtained.  Interval between EHL and EHB was developed  further.  Superficial peroneal nerve was identified.  A formal  superficial peroneal nerve neurolysis  was performed.  This was retracted  out of harms way.  We then performed a dorsal capsulotomy and elevated  the soft tissues around the first TMT joint.  Then with a sagittal saw,  a closing wedge osteotomy was then made taking off the wedge of the  lateral aspect of the distal medial cuneiform.  We then rounded off the  edges of the base of the first metatarsal and removed the remaining  cartilage as well.  This had an excellent fit.  We used 2-point  reduction clamp and view this under C-arm guidance and this was in an  excellent position.  We then removed the lateral aspect of the base of  the first metatarsal as well, so this would accommodate the reduction.  We then placed multiple drill holes on either side of the joint of the  first TMT joint, reduced the first TMT joint again with 2-point  reduction clamp, provisionally fixed this with a 2 mm K-wire.  We then  made a notch approximately 2 cm distal to the first TMT joint into the  base of first metatarsal.  We then placed a 3.5-mm fully-threaded  cortical lag screw using a 3.5 and 2.5 mm drill hole respectively.  This  had excellent purchase and compression across the fusion site.  Then  removed the K-wire and replaced this with 3.5-mm fully-threaded cortical  lag screw using a 3.5 and 2.5 mm drill hole respectively.  Again, this  had excellent purchase and compression across the fusion site.  Stress x-  rays were obtained.  AP and lateral planes showed no gross motion,  fixation, proposition, and excellent alignment as well.  All the bone  graft obtained from the simple bunionectomy, drill bits, and spur  removal were placed on the back table and packed into the first TMT  joint.  Afterward, it was copiously irrigated with normal saline as a  stress/strain-relieving bone graft.  We then completed the modified  McBride bunionectomy by reconstructing the medial capsule using 2-0  Vicryl stitch.  This had an Systems analyst.  This  was advanced both  superiorly and proximally reducing the sesamoids.  X-rays were obtained  and showed that the sesamoids were well located.  We then made a  longitudinal incision over the dorsal aspect of the second toe.  Dissection was carried down through the skin.  Hemostasis was obtained.  EDB, EDL tendons were identified.  The EDL was tenotomized proximally  and brevis distal and lateral and retracted out of harms way.  The  remaining ligamentous attachment of the base of the  proximal phalanx was  carefully dissected out and a dorsal capsulotomy and collateral release  was then performed.  We then skeletonized the distal aspect of the  proximal phalanx and the head was then removed with a rongeur.  This cut  was made perpendicular to long axis proximal phalanx.  We then  plantarflexed the second toe and then performed a Weil metatarsal  shortening osteotomy with stacked sagittal saw blade.  The head was then  translated approximately about 5 mm and then fixed with a 12 mm long 1.5  mm fully-threaded cortical set screw using 1.1 mm drill hole  respectively.  This had excellent purchase and maintenance of the  reduction.  Redundant bone wedge dorsally was trimmed off with a rongeur  and bone wax was applied to the exposed bone surface.  The exact same  procedure was performed for the third toe as well except for the third  toe was then completely dislocated like the second was.  At this point,  we then went back to the second toe and placed a drill hole through the  base of the proximal using a 2.5 mm drill hole followed by a 3.5 mm  drill hole.  We then made a longitudinal incision into the plantar plate  and identified the FDL tendon.  This was tenotomized distal as possible.  This was then pulled from plantar to dorsal through the drill hole.  We  then placed a 0.045 K-wire antegrade through the middle distal phalanx,  reduced to the proximal phalanx, and fired this retrograde  across the  MTP joint with the toe held in reduced position and tension on the FDL  tendon through the drill hole.  This had excellent purchase and  maintenance of the wanted position of the second toe.  We then  transferred the EDB to EDL using a 3-0 Maxon stitch and sewed this to  the stump of the FDL tendon dorsally as well.  This had an Geophysicist/field seismologist.  Same exact procedure was done for the third toe as well.  Tourniquet was deflated.  Hemostasis was obtained.  Toes pinked up  nicely.  Skin-relieving incisions were made on either side of the K-  wire.  K-wires were bent, cut, and capped.  Subcu was closed with 3-0  Vicryl over all wounds.  Skin was closed with 4-0 nylon over all wounds.  Sterile dressing was applied.  A Roger Mann dressing was applied.  Modified Jones dressing was applied.  The patient was stable to the PR.      Weber Cooks, M.D.  Electronically Signed     PB/MEDQ  D:  08/02/2008  T:  08/03/2008  Job:  [id number]

## 2011-03-12 NOTE — Telephone Encounter (Signed)
Pt needs prescription, and would like to be called when ready.

## 2011-03-12 NOTE — Consult Note (Signed)
NEW PATIENT CONSULTATION   Marissa [name]  DOB:  25-Jul-1946                                        January 30, 2010  CHART #:  [id number]   REASON FOR VISIT:  Followup ascending aortic aneurysm.   HISTORY OF PRESENT ILLNESS:  The patient is a 65 year old woman with  multiple medical problems including fibromyalgia, chronic fatigue  syndrome, asthma, COPD, hypertension, and mitral valve prolapse.  About  a year ago, she had pneumonia.  A CT scan was done at that time and it  found incidentally a 4-cm ascending aortic aneurysm.  I saw her in  consultation in September when a CT was done and the radiologist read it  as possibly enlarged; however, comparing the films from February to  September, there really was no significant change in the ascending  aortic aneurysm, which was about 4.1 cm in diameter.  She now returns  for 33-monthfollowup visit.  She states that she continues to have some  issues with chest tightness, fatigue, and shortness of breath that has  been attributed to asthma.  She had had a stress test about 4-5 years  previously that was negative and she was not felt to have any coronary  disease.  On questioning today, she says that she has some high steps in  her house.  She gets about three-quarters of the way up.  She will get  short of breath and feel a sort of a tightness in her chest.  She also  seems to fatigue very easily especially towards the end of the day.   PAST MEDICAL HISTORY:  Significant for:  1. Asthma.  2. COPD.  3. Chronic fatigue.  4. Fibromyalgia.  5. Hypertension.  6. Hyperlipidemia.  7. Mitral valve prolapse.  8. Migraine headaches.  9. Interstitial cystitis.  10.Hypothyroidism.   CURRENT MEDICATIONS:  1. Singulair 10 mg daily.  2. ProAir 108 mcg 2 puffs every 4-6 hours as needed.  3. Nasonex 50 mcg 2 sprays each nostril daily.  4. Lunesta 3 mg p.o. nightly.  5. Fluoxetine hydrochloride 40 mg daily.  6. Vitamin D  50,000 units twice weekly.  7. Demerol 50 mg q.8 h. p.r.n.  8. Armour Thyroid 90 mg daily.  9. Promethazine 50 mg q.8 h. p.r.n.  10.Adderall XR 10 mg p.o. p.r.n.,  11.Advair Diskus 250/50 one puff b.i.d. as needed.  12.Dulera 100/5 one puff b.i.d.  13.Verapamil 180 mg daily.   Since her last visit, Vivelle, MigraTen, metoprolol, Cobal, magnesium,  iron, chromium, and coenzyme Q10 have been discontinued.   ALLERGIES:  She has allergies to Feldene, Premarin, codeine, and  erythromycin.   No change in family or social history.   PHYSICAL EXAMINATION:  General:  The patient is a 65year old woman in  no acute distress.  She is well developed, well nourished, and well  appearing.  Neurological:  She is alert and oriented x3 with no focal  deficits.  Cardiac:  Very prominent heart sounds.  There is a  midsystolic click.  There is no murmur, rubs, or gallops.  Lungs:  Clear  with equal breath sounds bilaterally.  Neck:  Carotids are without  bruits.  Extremities:  Without clubbing, cyanosis, or edema.   CT scan of the chest is reviewed.  It shows no change in 4.1-cm  ascending aortic aneurysm,  which normalizes at the level of the aortic  arch.  This is unchanged from the films in September 2010 as well as  February 2010.   IMPRESSION:  The patient is a 66 year old woman with a complex medical  history.  She has an ascending aortic aneurysm, which is unchanged in  size over the past year.  I recommended that we follow her up with an MR  angio in 1 year.   Regarding her symptomatology, which is very difficult to make any  definitive conclusions given her complex history and certainly she has  syndromes that could explain all of her symptoms, shortness of breath,  asthma, chronic fatigue syndrome, fibromyalgia, and pain syndromes, but  just in talking to her, it feels like she probably should have a workup  for coronary disease.  I know this was done, but it was over 5 years ago  and  certainly she could have developed significant coronary disease at  that time, but the chest tightness with walking, shortness of breath  with exertion, and fatigue all could be symptoms of coronary disease,  although again as noted they all could have other explanations.  She is  seeing Dr. Brendia Sacks in [place] who she tells me is Board Certified  in Cardiology, and we will discuss these symptoms with him.  She sees  him in about 2 weeks.  She is going to discuss that with him and get his  opinion before making a decision as whether to pursue a cardiac workup.  I will plan to see her back in 1 year with an MR angio.   Marissa [name], M.D.  Electronically Signed   SCH/MEDQ  D:  01/30/2010  T:  01/31/2010  Job:  [id number]   cc:   Ricard Dillon, MD  Chesley Mires, MD  Brendia Sacks

## 2011-03-12 NOTE — Consult Note (Signed)
NEW PATIENT CONSULTATION   Marissa [name], Marissa [name]  DOB:  26-Oct-1946                                        July 17, 2009  CHART #:  [id number]   The patient is a 65 year old woman who presents for evaluation of an  ascending aortic aneurysm.   HISTORY OF PRESENT ILLNESS:  The patient is a 65 year old woman with  multiple medical problems including periodic hypertension, possible  mitral valve prolapse, chronic fatigue syndrome, fibromyalgia, and  asthma.  She was recently treated for pneumonia back in February.  At  that time, she had a CT scan, and in addition to the new pneumonia she  was noted to have a 4-cm ascending aortic aneurysm.  She had a followup  scan done recently and the radiologist felt this may have increased in  size.  The patient states that she has some chest tightness,  particularly when she lies down.  She also has it sometimes with  exertion.  This is associated with shortness of breath and has been  attributed it asthma.  It does improve with asthma medications.  This  has been longstanding for many years and has not changed recently.  She  had a stress test 4-5 years ago and was told she did not have any  significant cardiac issues.   Her past medical history is significant for:  1. Asthma/COPD.  2. Chronic fatigue syndrome.  3. Fibromyalgia.  4. Hypertension.  5. Hyperlipidemia.  6. Arthritis.  7. Mitral valve prolapse diagnosed at [facility], subsequently was told      that she did not have mitral valve prolapse by another physician.  8. Migraine headaches.  9. Interstitial cystitis.  10.Hypothyroidism.   Her current medications are:  1. Singulair 10 mg daily.  2. ProAir 2 puffs every 4-6 hours as needed.  3. Nasonex 50 mcg 2 sprays each nostril once daily as needed.  4. Lunesta 3 mg p.o. at bedtime.  5. Fluoxetine hydrochloride 40 mg caps 1 daily.  6. Vivelle-Dot 0.0375 mg for 24 hours twice weekly.  7. MigraTen 325/65/100 q.6  h. p.r.n. for migraines.  8. Vitamin D 50,000 units twice weekly.  9. Demerol 50 mg q.8 h. p.r.n. for pain.  10.Carisoprodol 350 mg p.o. b.i.d. p.r.n.  11.Metoprolol succinate one-half tablet p.o. b.i.d.  12.Cobal-3000, 1 mL subcutaneously twice weekly.  13.Armour Thyroid 90 mg p.o. daily.  14.Promethazine hydrochloride 50 mg every 8 hours as needed for      nausea.  15.Adderall XR 10 mg p.o. p.r.n.  16.Advair Diskus 250/50 one 1 puff b.i.d. as needed.  17.Ferrous sulfate 47.5 mg p.o. b.i.d.  18.Dulera 1 puff b.i.d.   ALLERGIES:  She has allergies to Premarin, Feldene, codeine,  erythromycin, and Duragesic.   FAMILY HISTORY:  Significant for uncle who had an aneurysm.  She is  unsure if it was in the chest or abdomen.   SOCIAL HISTORY:  She is married.  She is a retired Marine scientist.  She has never  smoked.  She drinks about one alcoholic drink a week.   REVIEW OF SYSTEMS:  Patient medical history form is included on the  chart.  Pertinent issues include weight gain chest tightness and  pressure with lying down, shortness of breath with lying flat,  palpitations, shortness of breath with exertion, heart murmur,  productive cough, wheezing,  occasional dysphagia, pain in legs and feet  when walking and lying flat, dizziness, headaches, arthritis, joint  pain, muscle pain, and anemia.  Other pertinent findings are on the  patient medical history form.  All other systems negative.   PHYSICAL EXAMINATION:  General:  The patient is a 65 year old woman in  no acute distress.  She is well developed and well nourished.  Vital  Signs:  Her blood pressure is 149/83, pulse 62, respirations 18.  Her ox  saturation is 99% on room air.  Neurologic:  She is alert and oriented  x3 with no focal deficits.  HEENT:  She has some mild alopecia,  otherwise unremarkable.  Neck:  Supple without thyromegaly, adenopathy,  or bruits.  Cardiac:  Regular rate and rhythm.  There is a normal S1 and  S2.  There is a  click and a 2/6 systolic murmur.  Lungs:  Clear without  any wheezing.  Abdomen:  Soft and nontender with no palpable aneurysm.  Extremities:  Her peripheral pulses are intact.  There is no popliteal  aneurysms.  She has no peripheral edema.   CT scans are reviewed.  The first CT is dated November 29, 2008, the most  recent is July 13, 2009.  Per the official radiology measurements,  this had originally measured 4 x 3.8 cm, now measures 4.2 x 4.1 to my  exam and measurement appears to be within a millimeter of its size in  February.   IMPRESSION:  The patient is a 65 year old woman who was recently found  to have an ascending aortic aneurysm.  His involves the entire ascending  aorta and the normalizes in size at the aortic arch.  At the present  time, it does not meet the size or growth criteria to recommend surgery.  I reviewed the scans with Mr. Wrench and the patient and this may have  enlarged very slightly over the past 6 months but not the 5 mm in 6  months that would be an indication for intervention.  Normally, an  aortic aneurysm in this location would to be repaired at 5.5-6 cm, but  based on her size and the size of the aorta, I would be a little more  liberal and probably recommend surgery at 5 cm.  At the present time, I  recommended to her that we repeat her CT angio in 6 months that will  give [place] a clear indication whether this is in fact growing or if that  changes are due to interobserver variability or volume averaging on the  CT angio.  She is in agreement with this plan.  We did discuss the  importance of blood pressure management.  Her blood pressure is elevated  today, but she is very medically astute and says that her blood pressure  normally runs in the 130/80 range.  I did not make any medication  changes with her today and would leave that decision up to Dr. Arnoldo Morale  and Dr. Halford Chessman if they felt she needed additional medication to control  her blood pressure.   It was noted that her blood pressure was 138/82  when she saw Dr. Halford Chessman on July 14, 2009.  Again, I will plan to see  the patient back in 6 months with a repeat CT scan at that time.   Revonda Standard Roxan Hockey, M.D.  Electronically Signed   SCH/MEDQ  D:  07/17/2009  T:  07/18/2009  Job:  [id number]   cc:   Chesley Mires,  MD  Ricard Dillon, MD

## 2011-03-15 NOTE — Op Note (Signed)
NAME:  Marissa [name], Marissa [name] [facility]                           ACCOUNT NO.:  [id number]   MEDICAL RECORD NO.:  [id number]                   PATIENT TYPE:  AMB   LOCATION:  [facility]                                 FACILITY:  [facility]   PHYSICIAN:  Domingo Pulse, M.D.               DATE OF BIRTH:  [birth year]   DATE OF PROCEDURE:  11/11/2003  DATE OF DISCHARGE:                                 OPERATIVE REPORT   PREOPERATIVE DIAGNOSES:  1. Chronic pelvic pain.  2. Rule out interstitial cystitis.   POSTOPERATIVE DIAGNOSES:  1. Chronic pelvic pain.  2. Rule out interstitial cystitis.   PROCEDURES:  1. Cystoscopy.  2. Urethral calibration.  3. Hydrodistention of the bladder.  4. Bladder biopsy.  5. Marcaine and Pyridium instillation.  6. Marcaine and Kenalog injection.   SURGEON:  Domingo Pulse, M.D.   ANESTHESIA:  General.   COMPLICATIONS:  None.   DRAINS:  None.   BRIEF HISTORY:  This 65 year old female has chronic fatigue syndrome and  fibromyalgia and has lower urinary tract symptoms felt to be consistent with  interstitial cystitis.  The patient was offered the opportunity of either  potassium testing or cystoscopy and hydrodistention to determine the source  of her pelvic pain.  The patient has asked that hydrodistention be  performed.  She understands the risks and benefits of the procedure,  including the fact that she will have definite postoperative pain and may  have no improvement whatsoever in her symptoms.  It is being done primarily  for diagnostic purposes.  Full informed consent was obtained.   DESCRIPTION OF PROCEDURE:  After successful induction of general anesthesia,  the patient was placed in the dorsal lithotomy position and prepped with  Betadine and draped in the usual sterile fashion.  Careful bimanual  examination of the pelvis revealed no cystocele, rectocele, or enterocele to  speak of.  The urethra was unremarkable with no signs of a diverticulum.  The urethra was  calibrated up to 45 [place] with female urethral sounds with  no evidence of stenosis or stricture.  The cystoscope was inserted.  The  bladder was carefully inspected.  It was free of any tumor or stone.  Both  ureteral orifices were normal in configuration and location.  The bladder  was distended at a pressure of 100 cmH2O for five minutes.  When the bladder  was drained a modest number of glomerulations could be seen, but the patient  turned out to have an excellent bladder capacity of 1400 mL.  A terminal  blood tinge at the end of the drain-out cycle was noted.  The patient  underwent a random biopsy.  The biopsy site was cauterized.  The  bladder was drained.  A mixture of Marcaine and Pyridium was left in the  bladder.  Marcaine and Kenalog were injected as a periurethral block.  The  patient tolerated the procedure well  and was taken to the recovery room in  good condition.                                               Domingo Pulse, M.D.    RJE/MEDQ  D:  11/11/2003  T:  11/11/2003  Job:  [id number]

## 2011-03-18 ENCOUNTER — Ambulatory Visit: Payer: Medicare Other | Admitting: Internal Medicine

## 2011-04-03 ENCOUNTER — Ambulatory Visit (INDEPENDENT_AMBULATORY_CARE_PROVIDER_SITE_OTHER): Payer: Medicare Other | Admitting: Internal Medicine

## 2011-04-03 ENCOUNTER — Encounter: Payer: Self-pay | Admitting: Internal Medicine

## 2011-04-03 VITALS — BP 126/80 | HR 76 | Temp 98.2°F | Resp 14 | Ht 64.0 in | Wt 132.0 lb

## 2011-04-03 DIAGNOSIS — IMO0001 Reserved for inherently not codable concepts without codable children: Secondary | ICD-10-CM

## 2011-04-03 DIAGNOSIS — F4323 Adjustment disorder with mixed anxiety and depressed mood: Secondary | ICD-10-CM

## 2011-04-03 DIAGNOSIS — G43909 Migraine, unspecified, not intractable, without status migrainosus: Secondary | ICD-10-CM

## 2011-04-03 DIAGNOSIS — E559 Vitamin D deficiency, unspecified: Secondary | ICD-10-CM

## 2011-04-03 DIAGNOSIS — E538 Deficiency of other specified B group vitamins: Secondary | ICD-10-CM

## 2011-04-03 DIAGNOSIS — E039 Hypothyroidism, unspecified: Secondary | ICD-10-CM

## 2011-04-03 MED ORDER — FLUOXETINE HCL 40 MG PO CAPS: 40.0000 mg | ORAL_CAPSULE | Freq: Two times a day (BID) | ORAL | Status: DC

## 2011-04-03 MED ORDER — THYROID 60 MG PO TABS: 90.0000 mg | ORAL_TABLET | Freq: Every day | ORAL | Status: DC

## 2011-04-03 MED ORDER — FLUOXETINE HCL 40 MG PO CAPS: 40.0000 mg | ORAL_CAPSULE | Freq: Every day | ORAL | Status: DC

## 2011-04-03 NOTE — Patient Instructions (Signed)
1/2 cc of B12 IM twice weekly

## 2011-04-03 NOTE — Progress Notes (Signed)
Subjective:    Patient ID: Marissa [name], female    DOB: 30-Dec-1945, 65 y.o.   MRN: [id number]  HPI Was not able to tolerate the nucynta She is back on the Demerol but states that pain control is adequate.  She states she feels drained pus this time has no Pap states that she doesn't feel like energy is reaching her brain.  She has a history of fibromyalgia and chronic fatigue  Review of Systems  Constitutional: Negative for activity change, appetite change and fatigue.  HENT: Negative for ear pain, congestion, neck pain, postnasal drip and sinus pressure.   Eyes: Negative for redness and visual disturbance.  Respiratory: Negative for cough, shortness of breath and wheezing.   Gastrointestinal: Negative for abdominal pain and abdominal distention.  Genitourinary: Negative for dysuria, frequency and menstrual problem.  Musculoskeletal: Positive for myalgias, joint swelling and arthralgias.  Skin: Negative for rash and wound.  Neurological: Negative for dizziness, weakness and headaches.  Hematological: Negative for adenopathy. Does not bruise/bleed easily.  Psychiatric/Behavioral: Negative for sleep disturbance and decreased concentration.   Past Medical History  Diagnosis Date  . Thyroid disease   . Asthma   . MVP (mitral valve prolapse)   . Migraine   . Dysthymia   . Dysthymia   . Arthritis    Past Surgical History  Procedure Date  . Abdominal hysterectomy   . Tonsilectomy, adenoidectomy, bilateral myringotomy and tubes   . Sinus irrigation   . Hand surgery   . Foot surgery     reports that she has never smoked. She does not have any smokeless tobacco history on file. She reports that she does not drink alcohol or use illicit drugs. family history includes Arthritis in her mother and Leukemia in her father. Allergies  Allergen Reactions  . Codeine     REACTION: makes her hyper  . Conjugated Estrogens   . Erythromycin     REACTION: had a rash with emycin, has done  ok with other meds in it's class  . Fentanyl     REACTION: resp distress, rash  . Piroxicam        Objective:   Physical Exam  Nursing note and vitals reviewed. Constitutional: She is oriented to person, place, and time. She appears well-developed and well-nourished. No distress.  HENT:  Head: Normocephalic and atraumatic.  Right Ear: External ear normal.  Left Ear: External ear normal.  Nose: Nose normal.  Mouth/Throat: Oropharynx is clear and moist.  Eyes: Conjunctivae and EOM are normal. Pupils are equal, round, and reactive to light.  Neck: Normal range of motion. Neck supple. No JVD present. No tracheal deviation present. No thyromegaly present.  Cardiovascular: Normal rate, regular rhythm, normal heart sounds and intact distal pulses.   No murmur heard. Pulmonary/Chest: Effort normal and breath sounds normal. She has no wheezes. She exhibits no tenderness.  Abdominal: Soft. Bowel sounds are normal.  Musculoskeletal: Normal range of motion. She exhibits edema and tenderness.  Lymphadenopathy:    She has no cervical adenopathy.  Neurological: She is alert and oriented to person, place, and time. She has normal reflexes. No cranial nerve deficit.  Skin: Skin is warm and dry. She is not diaphoretic.  Psychiatric: She has a normal mood and affect. Her behavior is normal.          Assessment & Plan:  Patient has fatigue related with her fibromyalgia.  She is para Prozac at 40 mg dose for quite some time now.  We discussed  titrating the Prozac to 40 mg twice a day for a trial.  She did fail the than knee pain protocol.  She states that she did better with the Demerol therefore we resumed Demerol for pain control.  Her fibromyalgia pain as her primary complaint at this time.

## 2011-04-03 NOTE — Assessment & Plan Note (Signed)
Had been taking a shot twice a week.  We recommend reduction. 1/2 cc twice a week to keep the frequency

## 2011-05-02 ENCOUNTER — Telehealth: Payer: Self-pay | Admitting: *Deleted

## 2011-05-02 NOTE — Telephone Encounter (Addendum)
Pt needs Meperidine 50 mg and Soma.  Please call when ready, and she needs it by tomorrow afternoon.

## 2011-05-03 ENCOUNTER — Other Ambulatory Visit: Payer: Self-pay | Admitting: *Deleted

## 2011-05-03 MED ORDER — MEPERIDINE HCL 50 MG PO TABS: 50.0000 mg | ORAL_TABLET | Freq: Three times a day (TID) | ORAL | Status: DC | PRN

## 2011-05-03 MED ORDER — CARISOPRODOL 350 MG PO TABS: 350.0000 mg | ORAL_TABLET | Freq: Two times a day (BID) | ORAL | Status: DC

## 2011-05-03 NOTE — Telephone Encounter (Signed)
Printed and ready for dr Arnoldo Morale signature

## 2011-05-03 NOTE — Telephone Encounter (Signed)
Ready for pick up

## 2011-05-24 ENCOUNTER — Other Ambulatory Visit: Payer: Self-pay | Admitting: Internal Medicine

## 2011-06-04 ENCOUNTER — Ambulatory Visit (INDEPENDENT_AMBULATORY_CARE_PROVIDER_SITE_OTHER): Payer: Medicare Other | Admitting: Internal Medicine

## 2011-06-04 ENCOUNTER — Encounter: Payer: Self-pay | Admitting: Internal Medicine

## 2011-06-04 DIAGNOSIS — D509 Iron deficiency anemia, unspecified: Secondary | ICD-10-CM

## 2011-06-04 DIAGNOSIS — K219 Gastro-esophageal reflux disease without esophagitis: Secondary | ICD-10-CM

## 2011-06-04 DIAGNOSIS — E559 Vitamin D deficiency, unspecified: Secondary | ICD-10-CM

## 2011-06-04 DIAGNOSIS — M1990 Unspecified osteoarthritis, unspecified site: Secondary | ICD-10-CM

## 2011-06-04 DIAGNOSIS — E039 Hypothyroidism, unspecified: Secondary | ICD-10-CM

## 2011-06-04 DIAGNOSIS — R064 Hyperventilation: Secondary | ICD-10-CM

## 2011-06-04 LAB — CBC WITH DIFFERENTIAL/PLATELET
Basophils Absolute: 0 10*3/uL (ref 0.0–0.1)
Eosinophils Relative: 0 % (ref 0.0–5.0)
Hemoglobin: 12.9 g/dL (ref 12.0–15.0)
Lymphocytes Relative: 17.6 % (ref 12.0–46.0)
Monocytes Relative: 7.2 % (ref 3.0–12.0)
Neutro Abs: 4.3 10*3/uL (ref 1.4–7.7)
Platelets: 221 10*3/uL (ref 150.0–400.0)
RDW: 12.5 % (ref 11.5–14.6)
WBC: 5.7 10*3/uL (ref 4.5–10.5)

## 2011-06-04 LAB — IRON: Iron: 99 ug/dL (ref 42–145)

## 2011-06-04 MED ORDER — MONTELUKAST SODIUM 10 MG PO TABS: 10.0000 mg | ORAL_TABLET | Freq: Every day | ORAL | Status: DC

## 2011-06-04 MED ORDER — ESZOPICLONE 3 MG PO TABS: 3.0000 mg | ORAL_TABLET | Freq: Every day | ORAL | Status: DC

## 2011-06-04 NOTE — Progress Notes (Signed)
  Subjective:    Patient ID: Marissa [name], female    DOB: [birth year], 65 y.o.   MRN: [id number]  HPI increased mood Decreased use of demerol for pain Asthma stable Discussion of vit d    Review of Systems  Constitutional: Positive for fatigue. Negative for activity change and appetite change.  HENT: Negative for ear pain, congestion, neck pain, postnasal drip and sinus pressure.   Eyes: Negative for redness and visual disturbance.  Respiratory: Negative for cough, shortness of breath and wheezing.   Gastrointestinal: Negative for abdominal pain and abdominal distention.  Genitourinary: Negative for dysuria, frequency and menstrual problem.  Musculoskeletal: Negative for myalgias, joint swelling and arthralgias.  Skin: Negative for rash and wound.  Neurological: Positive for weakness. Negative for dizziness and headaches.  Hematological: Negative for adenopathy. Does not bruise/bleed easily.  Psychiatric/Behavioral: Negative for sleep disturbance and decreased concentration.   Past Medical History  Diagnosis Date  . Thyroid disease   . Asthma   . MVP (mitral valve prolapse)   . Migraine   . Dysthymia   . Dysthymia   . Arthritis    Past Surgical History  Procedure Date  . Abdominal hysterectomy   . Tonsilectomy, adenoidectomy, bilateral myringotomy and tubes   . Sinus irrigation   . Hand surgery   . Foot surgery     reports that she has never smoked. She does not have any smokeless tobacco history on file. She reports that she does not drink alcohol or use illicit drugs. family history includes Arthritis in her mother and Leukemia in her father. Allergies  Allergen Reactions  . Codeine     REACTION: makes her hyper  . Conjugated Estrogens   . Erythromycin     REACTION: had a rash with emycin, has done ok with other meds in it's class  . Fentanyl     REACTION: resp distress, rash  . Piroxicam        Objective:   Physical Exam  Constitutional: She is oriented to  person, place, and time. She appears well-developed and well-nourished. No distress.  HENT:  Head: Normocephalic and atraumatic.  Right Ear: External ear normal.  Left Ear: External ear normal.  Nose: Nose normal.  Mouth/Throat: Oropharynx is clear and moist.  Eyes: Conjunctivae and EOM are normal. Pupils are equal, round, and reactive to light.  Neck: Normal range of motion. Neck supple. No JVD present. No tracheal deviation present. No thyromegaly present.  Cardiovascular: Normal rate, regular rhythm, normal heart sounds and intact distal pulses.   No murmur heard. Pulmonary/Chest: Effort normal and breath sounds normal. She has no wheezes. She exhibits no tenderness.  Abdominal: Soft. Bowel sounds are normal.  Musculoskeletal: Normal range of motion. She exhibits no edema and no tenderness.  Lymphadenopathy:    She has no cervical adenopathy.  Neurological: She is alert and oriented to person, place, and time. She has normal reflexes. No cranial nerve deficit.  Skin: Skin is warm and dry. She is not diaphoretic.  Psychiatric: She has a normal mood and affect. Her behavior is normal.          Assessment & Plan:  Fibromyalgia is actually improved at this the Prozac has been successful in giving her increased energy. Her  asthma is stable although she admits to some increased use of breath to inhaler during the heat wave.  Pain control is adequate on her current medications nausea is controlled blood pressure has been stable. Anemia has been stable

## 2011-06-26 ENCOUNTER — Telehealth: Payer: Self-pay | Admitting: Internal Medicine

## 2011-06-26 MED ORDER — MEPERIDINE HCL 50 MG PO TABS: 50.0000 mg | ORAL_TABLET | Freq: Three times a day (TID) | ORAL | Status: DC | PRN

## 2011-06-26 NOTE — Telephone Encounter (Signed)
Pt informed ready for pick up

## 2011-06-26 NOTE — Telephone Encounter (Signed)
Pt  Is having a hard time getting meperidine (DEMEROL) 50 MG tablet refilled at her pharmacy and it requesting a paper script to resubmit to her pharmacy. Please contact pt.

## 2011-07-17 ENCOUNTER — Other Ambulatory Visit: Payer: Self-pay | Admitting: *Deleted

## 2011-07-17 MED ORDER — ESZOPICLONE 3 MG PO TABS: 3.0000 mg | ORAL_TABLET | Freq: Every day | ORAL | Status: DC

## 2011-07-30 LAB — BASIC METABOLIC PANEL
CO2: 27
Calcium: 9.2
Creatinine, Ser: 0.58
GFR calc Af Amer: >60

## 2011-08-21 ENCOUNTER — Telehealth: Payer: Self-pay | Admitting: Internal Medicine

## 2011-08-21 MED ORDER — MEPERIDINE HCL 50 MG PO TABS: 50.0000 mg | ORAL_TABLET | Freq: Three times a day (TID) | ORAL | Status: DC | PRN

## 2011-08-21 NOTE — Telephone Encounter (Signed)
Script is waiting for signing by dr Arnoldo Morale to [facility] when he returns

## 2011-08-21 NOTE — Telephone Encounter (Signed)
Pt requesting refill on meperidine (DEMEROL) 50 MG tablet  Dose: 50 mg Route: Oral Frequency: Every 8 hours PRN   please contact when ready to pick up

## 2011-08-27 ENCOUNTER — Telehealth: Payer: Self-pay | Admitting: *Deleted

## 2011-08-27 MED ORDER — OFLOXACIN 0.3 % OT SOLN: 4.0000 [drp] | Freq: Every day | OTIC | Status: AC

## 2011-08-27 NOTE — Telephone Encounter (Signed)
Notified pt. 

## 2011-08-27 NOTE — Telephone Encounter (Signed)
Pt is having a pressure in left ear with roaring and hearing loss.  Has nausea, but no vomiting.  Decongestants are not helping.

## 2011-08-27 NOTE — Telephone Encounter (Signed)
Per dr Arnoldo Morale- take mucinex d otc as directed and may have floxin otic drops 4 drops bid-if that doesn help she will need ov with someone

## 2011-09-12 ENCOUNTER — Ambulatory Visit (INDEPENDENT_AMBULATORY_CARE_PROVIDER_SITE_OTHER): Payer: Medicare Other | Admitting: Internal Medicine

## 2011-09-12 ENCOUNTER — Encounter: Payer: Self-pay | Admitting: Internal Medicine

## 2011-09-12 VITALS — BP 124/80 | HR 68 | Temp 98.8°F | Ht 64.5 in | Wt 138.0 lb

## 2011-09-12 DIAGNOSIS — J45909 Unspecified asthma, uncomplicated: Secondary | ICD-10-CM

## 2011-09-12 DIAGNOSIS — Z0000 Encounter for general adult medical examination without abnormal findings: Secondary | ICD-10-CM

## 2011-09-12 DIAGNOSIS — M797 Fibromyalgia: Secondary | ICD-10-CM

## 2011-09-12 DIAGNOSIS — Z23 Encounter for immunization: Secondary | ICD-10-CM

## 2011-09-12 DIAGNOSIS — H6980 Other specified disorders of Eustachian tube, unspecified ear: Secondary | ICD-10-CM

## 2011-09-12 DIAGNOSIS — IMO0001 Reserved for inherently not codable concepts without codable children: Secondary | ICD-10-CM

## 2011-09-12 DIAGNOSIS — H6990 Unspecified Eustachian tube disorder, unspecified ear: Secondary | ICD-10-CM

## 2011-09-12 MED ORDER — MEPERIDINE HCL 50 MG PO TABS: 50.0000 mg | ORAL_TABLET | Freq: Three times a day (TID) | ORAL | Status: DC | PRN

## 2011-09-12 NOTE — Progress Notes (Signed)
Subjective:    Patient ID: Marissa [name], female    DOB: 1946/01/31, 65 y.o.   MRN: [id number]  HPI Patient is a 65 year old female who presents for followup of chronic fibromyalgia.  She presents today with acute upper respiratory tract symptoms with ear pain hearing loss and congestion she notes postnasal drip and mild to moderate headache   Review of Systems  Constitutional: Negative for activity change, appetite change and fatigue.  HENT: Positive for hearing loss, ear pain and congestion. Negative for neck pain, postnasal drip and sinus pressure.   Eyes: Negative for redness and visual disturbance.  Respiratory: Negative for cough, shortness of breath and wheezing.   Gastrointestinal: Negative for abdominal pain and abdominal distention.  Genitourinary: Negative for dysuria, frequency and menstrual problem.  Musculoskeletal: Negative for myalgias, joint swelling and arthralgias.  Skin: Negative for rash and wound.  Neurological: Negative for dizziness, weakness and headaches.  Hematological: Negative for adenopathy. Does not bruise/bleed easily.  Psychiatric/Behavioral: Negative for sleep disturbance and decreased concentration.   Past Medical History  Diagnosis Date  . Thyroid disease   . Asthma   . MVP (mitral valve prolapse)   . Migraine   . Dysthymia   . Dysthymia   . Arthritis     History   Social History  . Marital Status: Married    Spouse Name: N/A    Number of Children: N/A  . Years of Education: N/A   Occupational History  . Not on file.   Social History Main Topics  . Smoking status: Never Smoker   . Smokeless tobacco: Not on file  . Alcohol Use: No  . Drug Use: No  . Sexually Active: No   Other Topics Concern  . Not on file   Social History Narrative  . No narrative on file    Past Surgical History  Procedure Date  . Abdominal hysterectomy   . Tonsilectomy, adenoidectomy, bilateral myringotomy and tubes   . Sinus irrigation   . Hand  surgery   . Foot surgery     Family History  Problem Relation Age of Onset  . Arthritis Mother   . Leukemia Father     Allergies  Allergen Reactions  . Codeine     REACTION: makes her hyper  . Conjugated Estrogens   . Erythromycin     REACTION: had a rash with emycin, has done ok with other meds in it's class  . Fentanyl     REACTION: resp distress, rash  . Piroxicam     Current Outpatient Prescriptions on File Prior to Visit  Medication Sig Dispense Refill  . albuterol (PROAIR HFA) 108 (90 BASE) MCG/ACT inhaler Inhale 2 puffs into the lungs every 6 (six) hours as needed.  1 Inhaler  6  . carisoprodol (SOMA) 350 MG tablet Take 1 tablet (350 mg total) by mouth 2 (two) times daily.  60 tablet  3  . cyanocobalamin (,VITAMIN B-12,) 1000 MCG/ML injection Inject 1,000 mcg into the muscle 2 (two) times a week.        . DOXEPIN HCL PO Take 10 mg by mouth. 20m/1ml concentrate -take 2-5 drops in water at bedtime       . ergocalciferol (VITAMIN D2) 50000 UNITS capsule Take 1 capsule (50,000 Units total) by mouth 2 (two) times a week.  10 capsule  3  . Eszopiclone (ESZOPICLONE) 3 MG TABS Take 1 tablet (3 mg total) by mouth at bedtime. Take immediately before bedtime  30 tablet  5  .  Ferrous Sulfate (SLOW RELEASE IRON) 47.5 MG TBCR Take 47.5 mg by mouth daily.        [name] Kitchen FLUoxetine (PROZAC) 40 MG capsule TAKE ONE CAPSULE BY MOUTH EVERY DAY  30 capsule  6  . meperidine (DEMEROL) 50 MG tablet Take 1 tablet (50 mg total) by mouth every 8 (eight) hours as needed.  60 tablet  0  . metroNIDAZOLE (METROGEL) 1 % gel Apply topically daily.  45 g  0  . mometasone (NASONEX) 50 MCG/ACT nasal spray 2 sprays by Nasal route daily.  17 g  6  . montelukast (SINGULAIR) 10 MG tablet Take 1 tablet (10 mg total) by mouth at bedtime.  30 tablet  11  . promethazine (PHENERGAN) 50 MG tablet Take 50 mg by mouth every 6 (six) hours as needed.        . thyroid (ARMOUR) 60 MG tablet Take 1.5 tablets (90 mg total) by  mouth daily. `1.5 daily for 90 total  45 tablet  6  . verapamil (CALAN-SR) 180 MG CR tablet Take 180 mg by mouth at bedtime.          BP 124/80  Pulse 68  Temp(Src) 98.8 F (37.1 C) (Oral)  Ht 5' 4.5" (1.638 m)  Wt 138 lb (62.596 kg)  BMI 23.32 kg/m2       Objective:   Physical Exam  Nursing note and vitals reviewed. Constitutional: She is oriented to person, place, and time. She appears well-developed and well-nourished. No distress.  HENT:  Head: Normocephalic and atraumatic.  Right Ear: External ear normal.  Left Ear: External ear normal.  Nose: Nose normal.  Mouth/Throat: Oropharynx is clear and moist.       Eustachian tube dysfunction with retraction bilaterally of the eardrums no plaques no inflammation  Eyes: Conjunctivae and EOM are normal. Pupils are equal, round, and reactive to light.  Neck: Normal range of motion. Neck supple. No JVD present. No tracheal deviation present. No thyromegaly present.  Cardiovascular: Normal rate, regular rhythm, normal heart sounds and intact distal pulses.   No murmur heard. Pulmonary/Chest: Effort normal and breath sounds normal. She has no wheezes. She exhibits no tenderness.  Abdominal: Soft. Bowel sounds are normal.  Musculoskeletal: Normal range of motion. She exhibits no edema and no tenderness.  Lymphadenopathy:    She has no cervical adenopathy.  Neurological: She is alert and oriented to person, place, and time. She has normal reflexes. No cranial nerve deficit.  Skin: Skin is warm and dry. She is not diaphoretic.  Psychiatric: She has a normal mood and affect. Her behavior is normal.          Assessment & Plan:  Acute eustachian tube dysfunction from acute sinusitis. Discussed the use of decongestants such as Allegra-D or Mucinex D.

## 2011-09-12 NOTE — Patient Instructions (Signed)
The patient is instructed to continue all medications as prescribed. Schedule followup with check out clerk upon leaving the clinic  

## 2011-09-18 ENCOUNTER — Other Ambulatory Visit: Payer: Self-pay | Admitting: Internal Medicine

## 2011-10-14 ENCOUNTER — Other Ambulatory Visit: Payer: Self-pay | Admitting: *Deleted

## 2011-10-14 MED ORDER — CARISOPRODOL 350 MG PO TABS: 350.0000 mg | ORAL_TABLET | Freq: Two times a day (BID) | ORAL | Status: DC

## 2011-10-28 ENCOUNTER — Telehealth: Payer: Self-pay | Admitting: *Deleted

## 2011-10-28 MED ORDER — AZITHROMYCIN 250 MG PO TABS: ORAL_TABLET | ORAL | Status: AC

## 2011-10-28 NOTE — Telephone Encounter (Signed)
Cold X 2 weeks, sinus infection X 2 days.  Sinus drainage, achy, cough.  Has been taking Muccinex D with no relief.  Current meds singulair, proair.  Pt requesting something to be called in.

## 2011-10-28 NOTE — Telephone Encounter (Signed)
zpack

## 2011-11-05 ENCOUNTER — Other Ambulatory Visit: Payer: Self-pay | Admitting: Internal Medicine

## 2011-12-13 ENCOUNTER — Ambulatory Visit (INDEPENDENT_AMBULATORY_CARE_PROVIDER_SITE_OTHER): Payer: Medicare Other | Admitting: Family

## 2011-12-13 ENCOUNTER — Ambulatory Visit: Payer: Medicare Other | Admitting: Family

## 2011-12-13 ENCOUNTER — Encounter: Payer: Self-pay | Admitting: Family

## 2011-12-13 ENCOUNTER — Telehealth: Payer: Self-pay | Admitting: Family Medicine

## 2011-12-13 VITALS — BP 120/70 | Temp 98.6°F | Wt 135.0 lb

## 2011-12-13 DIAGNOSIS — R05 Cough: Secondary | ICD-10-CM

## 2011-12-13 DIAGNOSIS — R059 Cough, unspecified: Secondary | ICD-10-CM

## 2011-12-13 DIAGNOSIS — J019 Acute sinusitis, unspecified: Secondary | ICD-10-CM

## 2011-12-13 DIAGNOSIS — H9209 Otalgia, unspecified ear: Secondary | ICD-10-CM

## 2011-12-13 MED ORDER — AMOXICILLIN 500 MG PO TABS: 1000.0000 mg | ORAL_TABLET | Freq: Two times a day (BID) | ORAL | Status: DC

## 2011-12-13 MED ORDER — AZITHROMYCIN 250 MG PO TABS: ORAL_TABLET | ORAL | Status: AC

## 2011-12-13 NOTE — Telephone Encounter (Signed)
Marissa [name],  I am working on a prior auth for this pt. I see in the chart she's taken Synthroid and Armour. This PA is for the Armour. I need to know why he cannot or is not taking Synthroid. Thanks!

## 2011-12-13 NOTE — Telephone Encounter (Signed)
Per dr Darlen Round needs to see padonda

## 2011-12-13 NOTE — Patient Instructions (Signed)

## 2011-12-13 NOTE — Telephone Encounter (Signed)
She tried synthroid and generic synthroid and it didn't work as well as armour thyroid.. Armour thyroid keep thyroid lab normal and pt feels better with armour thryoid

## 2011-12-13 NOTE — Telephone Encounter (Signed)
Pt is calling and complaining of being extremely ill with sinus pain, headache, dizziness, confusion, and the right side is much worse.  Is desperate for some help with this.  Has slept 3 days straight.

## 2011-12-13 NOTE — Progress Notes (Signed)
Subjective:    Patient ID: Marissa [name], female    DOB: Jan 23, 1946, 66 y.o.   MRN: [id number]  HPI Comments: C/o sinus headaches, nasal drainage, nausea, chills, and aching teeth x 3weeks. Had same s/s back in Oct for 2 weeks and was tx with azithromycin. Has h/o asthma controlled with singulair and albuterol rescue inhaler. Denies wheezing.   Sinusitis Associated symptoms include chills, ear pain and sinus pressure. Pertinent negatives include no congestion, diaphoresis or sneezing.  Otalgia  Associated symptoms include rhinorrhea. Pertinent negatives include no ear discharge or hearing loss.      Review of Systems  Constitutional: Positive for chills and fatigue. Negative for fever and diaphoresis.  HENT: Positive for ear pain, rhinorrhea, postnasal drip and sinus pressure. Negative for hearing loss, nosebleeds, congestion, facial swelling, sneezing, neck stiffness, tinnitus and ear discharge.   Eyes: Negative.   Respiratory: Negative.   Cardiovascular: Negative.    Past Medical History  Diagnosis Date  . Thyroid disease   . Asthma   . MVP (mitral valve prolapse)   . Migraine   . Dysthymia   . Dysthymia   . Arthritis     History   Social History  . Marital Status: Married    Spouse Name: N/A    Number of Children: N/A  . Years of Education: N/A   Occupational History  . Not on file.   Social History Main Topics  . Smoking status: Never Smoker   . Smokeless tobacco: Not on file  . Alcohol Use: No  . Drug Use: No  . Sexually Active: No   Other Topics Concern  . Not on file   Social History Narrative  . No narrative on file    Past Surgical History  Procedure Date  . Abdominal hysterectomy   . Tonsilectomy, adenoidectomy, bilateral myringotomy and tubes   . Sinus irrigation   . Hand surgery   . Foot surgery     Family History  Problem Relation Age of Onset  . Arthritis Mother   . Leukemia Father     Allergies  Allergen Reactions  . Codeine     REACTION: makes her hyper  . Conjugated Estrogens   . Erythromycin     REACTION: had a rash with emycin, has done ok with other meds in it's class  . Fentanyl     REACTION: resp distress, rash  . Piroxicam     Current Outpatient Prescriptions on File Prior to Visit  Medication Sig Dispense Refill  . carisoprodol (SOMA) 350 MG tablet Take 1 tablet (350 mg total) by mouth 2 (two) times daily.  60 tablet  5  . cyanocobalamin (,VITAMIN B-12,) 1000 MCG/ML injection Inject 1,000 mcg into the muscle 2 (two) times a week.        . estradiol (VIVELLE-DOT) 0.05 MG/24HR Place 1 patch onto the skin 2 (two) times a week.        . Eszopiclone (ESZOPICLONE) 3 MG TABS Take 1 tablet (3 mg total) by mouth at bedtime. Take immediately before bedtime  30 tablet  5  . FLUoxetine (PROZAC) 40 MG capsule TAKE ONE CAPSULE BY MOUTH EVERY DAY  30 capsule  6  . meperidine (DEMEROL) 50 MG tablet Take 1 tablet (50 mg total) by mouth every 8 (eight) hours as needed.  60 tablet  0  . metroNIDAZOLE (METROGEL) 1 % gel Apply topically daily.  45 g  0  . montelukast (SINGULAIR) 10 MG tablet Take 1 tablet (10 mg  total) by mouth at bedtime.  30 tablet  11  . thyroid (ARMOUR) 60 MG tablet Take 1.5 tablets (90 mg total) by mouth daily. `1.5 daily for 90 total  45 tablet  6  . verapamil (CALAN-SR) 180 MG CR tablet TAKE 1 TABLET EVERY DAY  30 tablet  10  . Vitamin D, Ergocalciferol, (DRISDOL) 50000 UNITS CAPS TAKE 1 CAPSULE (50,000 UNITS TOTAL) BY MOUTH 2 (TWO) TIMES A WEEK.  10 capsule  3  . albuterol (PROAIR HFA) 108 (90 BASE) MCG/ACT inhaler Inhale 2 puffs into the lungs every 6 (six) hours as needed.  1 Inhaler  6  . DOXEPIN HCL PO Take 10 mg by mouth. 33m/1ml concentrate -take 2-5 drops in water at bedtime       . Ferrous Sulfate (SLOW RELEASE IRON) 47.5 MG TBCR Take 47.5 mg by mouth daily.        . mometasone (NASONEX) 50 MCG/ACT nasal spray 2 sprays by Nasal route daily.  17 g  6  . promethazine (PHENERGAN) 50 MG tablet  Take 50 mg by mouth every 6 (six) hours as needed.          BP 120/70  Temp(Src) 98.6 F (37 C) (Oral)  Wt 135 lb (61.236 kg)chart     Objective:   Physical Exam  Constitutional: She is oriented to person, place, and time. She appears well-developed and well-nourished. No distress.  HENT:  Right Ear: External ear normal.  Left Ear: External ear normal.  Nose: Nose normal.  Mouth/Throat: Oropharynx is clear and moist.  Eyes: Conjunctivae are normal. Right eye exhibits no discharge. Left eye exhibits no discharge.  Cardiovascular: Normal rate, regular rhythm and normal heart sounds.  Exam reveals no gallop and no friction rub.   No murmur heard. Pulmonary/Chest: Effort normal and breath sounds normal. No respiratory distress. She has no wheezes. She has no rales.  Neurological: She is alert and oriented to person, place, and time.  Skin: Skin is warm and dry. She is not diaphoretic.  Psychiatric: She has a normal mood and affect.          Assessment & Plan:  Assessment: Sinusitis, Otalgia, Cough  Plan: Rest, increase po fluids, azithromycin, rtc if s/s do not resolve in one week or if s/s get worse, teaching handout sinusitis provided.

## 2011-12-13 NOTE — Telephone Encounter (Signed)
Don't understand Horris Latino.

## 2011-12-13 NOTE — Telephone Encounter (Signed)
Pt was scheduled with Padonda.

## 2011-12-16 ENCOUNTER — Other Ambulatory Visit: Payer: Self-pay | Admitting: Internal Medicine

## 2011-12-17 ENCOUNTER — Other Ambulatory Visit (INDEPENDENT_AMBULATORY_CARE_PROVIDER_SITE_OTHER): Payer: Medicare Other

## 2011-12-17 DIAGNOSIS — Z79899 Other long term (current) drug therapy: Secondary | ICD-10-CM

## 2011-12-17 DIAGNOSIS — Z0000 Encounter for general adult medical examination without abnormal findings: Secondary | ICD-10-CM

## 2011-12-17 LAB — CBC WITH DIFFERENTIAL/PLATELET
Eosinophils Relative: 3.4 % (ref 0.0–5.0)
HCT: 39.3 % (ref 36.0–46.0)
Lymphs Abs: 1.2 10*3/uL (ref 0.7–4.0)
Monocytes Relative: 9.8 % (ref 3.0–12.0)
Neutrophils Relative %: 58.2 % (ref 43.0–77.0)
Platelets: 213 10*3/uL (ref 150.0–400.0)
RBC: 4.18 Mil/uL (ref 3.87–5.11)
WBC: 4.2 10*3/uL — ABNORMAL LOW (ref 4.5–10.5)

## 2011-12-17 LAB — POCT URINALYSIS DIPSTICK
Glucose, UA: NEGATIVE
Leukocytes, UA: NEGATIVE
Nitrite, UA: NEGATIVE

## 2011-12-17 LAB — HEPATIC FUNCTION PANEL
ALT: 26 U/L (ref 0–35)
Alkaline Phosphatase: 55 U/L (ref 39–117)
Bilirubin, Direct: 0 mg/dL (ref 0.0–0.3)
Total Bilirubin: 0.6 mg/dL (ref 0.3–1.2)

## 2011-12-17 LAB — LIPID PANEL
Total CHOL/HDL Ratio: 3
Triglycerides: 89 mg/dL (ref 0.0–149.0)

## 2011-12-17 LAB — BASIC METABOLIC PANEL
Chloride: 100 mEq/L (ref 96–112)
Creatinine, Ser: 0.6 mg/dL (ref 0.4–1.2)
Potassium: 4.4 mEq/L (ref 3.5–5.1)
Sodium: 138 mEq/L (ref 135–145)

## 2011-12-17 LAB — TSH: TSH: 0.89 u[IU]/mL (ref 0.35–5.50)

## 2011-12-20 ENCOUNTER — Other Ambulatory Visit: Payer: Self-pay | Admitting: *Deleted

## 2011-12-20 MED ORDER — CYANOCOBALAMIN 1000 MCG/ML IJ SOLN: 1000.0000 ug | INTRAMUSCULAR | Status: DC

## 2011-12-24 ENCOUNTER — Other Ambulatory Visit: Payer: Self-pay | Admitting: *Deleted

## 2011-12-24 MED ORDER — CYANOCOBALAMIN 1000 MCG/ML IJ SOLN: 1000.0000 ug | INTRAMUSCULAR | Status: DC

## 2011-12-25 ENCOUNTER — Encounter: Payer: Self-pay | Admitting: Internal Medicine

## 2011-12-25 ENCOUNTER — Other Ambulatory Visit: Payer: Self-pay | Admitting: *Deleted

## 2011-12-25 ENCOUNTER — Ambulatory Visit (INDEPENDENT_AMBULATORY_CARE_PROVIDER_SITE_OTHER): Payer: Medicare Other | Admitting: Internal Medicine

## 2011-12-25 VITALS — BP 126/68 | HR 76 | Temp 98.2°F | Ht 64.5 in | Wt 134.0 lb

## 2011-12-25 DIAGNOSIS — R51 Headache: Secondary | ICD-10-CM

## 2011-12-25 DIAGNOSIS — G43909 Migraine, unspecified, not intractable, without status migrainosus: Secondary | ICD-10-CM

## 2011-12-25 DIAGNOSIS — H6980 Other specified disorders of Eustachian tube, unspecified ear: Secondary | ICD-10-CM

## 2011-12-25 DIAGNOSIS — R519 Headache, unspecified: Secondary | ICD-10-CM

## 2011-12-25 DIAGNOSIS — I10 Essential (primary) hypertension: Secondary | ICD-10-CM

## 2011-12-25 MED ORDER — VERAPAMIL HCL ER 120 MG PO TBCR: 120.0000 mg | EXTENDED_RELEASE_TABLET | Freq: Two times a day (BID) | ORAL | Status: DC

## 2011-12-25 MED ORDER — MEPERIDINE HCL 50 MG PO TABS: 50.0000 mg | ORAL_TABLET | Freq: Three times a day (TID) | ORAL | Status: DC | PRN

## 2011-12-25 NOTE — Patient Instructions (Signed)
The patient is instructed to continue all medications as prescribed. Schedule followup with check out clerk upon leaving the clinic  

## 2011-12-25 NOTE — Progress Notes (Signed)
Addended by: Townsend Roger D on: 12/25/2011 02:02 PM   Modules accepted: Orders

## 2011-12-25 NOTE — Progress Notes (Signed)
  Subjective:    Patient ID: Marissa [name], female    DOB: 18-Feb-1946, 66 y.o.   MRN: [id number]  HPI Ran out of the pain medications and and had increased pain and noted elevation of Blood pressure Eustation tube dysfunction right ear Fatigue Hits the wall at 6-9 pm with palpitations Verapamil at night    Review of Systems  Constitutional: Negative for activity change, appetite change and fatigue.  HENT: Negative for ear pain, congestion, neck pain, postnasal drip and sinus pressure.   Eyes: Negative for redness and visual disturbance.  Respiratory: Negative for cough, shortness of breath and wheezing.   Gastrointestinal: Negative for abdominal pain and abdominal distention.  Genitourinary: Negative for dysuria, frequency and menstrual problem.  Musculoskeletal: Negative for myalgias, joint swelling and arthralgias.  Skin: Negative for rash and wound.  Neurological: Negative for dizziness, weakness and headaches.  Hematological: Negative for adenopathy. Does not bruise/bleed easily.  Psychiatric/Behavioral: Negative for sleep disturbance and decreased concentration.       Objective:   Physical Exam  Nursing note and vitals reviewed. Constitutional: She is oriented to person, place, and time. She appears well-developed and well-nourished. No distress.  HENT:  Head: Normocephalic and atraumatic.  Right Ear: External ear normal.  Left Ear: External ear normal.  Nose: Nose normal.  Mouth/Throat: Oropharynx is clear and moist.  Eyes: Conjunctivae and EOM are normal. Pupils are equal, round, and reactive to light.  Neck: Normal range of motion. Neck supple. No JVD present. No tracheal deviation present. No thyromegaly present.  Cardiovascular: Normal rate, regular rhythm, normal heart sounds and intact distal pulses.   No murmur heard. Pulmonary/Chest: Effort normal and breath sounds normal. She has no wheezes. She exhibits no tenderness.  Abdominal: Soft. Bowel sounds are normal.   Musculoskeletal: Normal range of motion. She exhibits no edema and no tenderness.  Lymphadenopathy:    She has no cervical adenopathy.  Neurological: She is alert and oriented to person, place, and time. She has normal reflexes. No cranial nerve deficit.  Skin: Skin is warm and dry. She is not diaphoretic.  Psychiatric: She has a normal mood and affect. Her behavior is normal.          Assessment & Plan:  Eustation tube dysfunction Add low dose decongestant vs nasal spray     Trial of Qnasal  Blood pressure  Change the verapamil to BID for control  Monitor blood work/ reviewed lipids and CBC

## 2012-01-02 ENCOUNTER — Other Ambulatory Visit: Payer: Self-pay | Admitting: Thoracic Surgery (Cardiothoracic Vascular Surgery)

## 2012-01-02 DIAGNOSIS — I712 Thoracic aortic aneurysm, without rupture: Secondary | ICD-10-CM

## 2012-01-06 ENCOUNTER — Other Ambulatory Visit: Payer: Self-pay | Admitting: *Deleted

## 2012-01-06 MED ORDER — ESZOPICLONE 3 MG PO TABS: 3.0000 mg | ORAL_TABLET | Freq: Every day | ORAL | Status: DC

## 2012-01-17 ENCOUNTER — Telehealth: Payer: Self-pay | Admitting: *Deleted

## 2012-01-17 DIAGNOSIS — H6990 Unspecified Eustachian tube disorder, unspecified ear: Secondary | ICD-10-CM

## 2012-01-17 DIAGNOSIS — R519 Headache, unspecified: Secondary | ICD-10-CM

## 2012-01-17 DIAGNOSIS — H6980 Other specified disorders of Eustachian tube, unspecified ear: Secondary | ICD-10-CM

## 2012-01-17 DIAGNOSIS — I10 Essential (primary) hypertension: Secondary | ICD-10-CM

## 2012-01-17 MED ORDER — VERAPAMIL HCL ER 240 MG PO CP24: 240.0000 mg | ORAL_CAPSULE | Freq: Every day | ORAL | Status: DC

## 2012-01-17 NOTE — Telephone Encounter (Signed)
Pt is taking Verapamil SR 120 mg. Bid, and is totally knocking her out.  She feels anxious, jittery, nervous, and lethargic. She held off and did not take the second dose for several hours and her BP went up to 180/90.  Is asking if there is anyway to spread it out so she doesn't have the two extreme symptoms or to change the med???

## 2012-01-17 NOTE — Telephone Encounter (Signed)
Per dr Arnoldo Morale change to verapamil er 240 at bedtime- pt informed and med sent in

## 2012-01-29 ENCOUNTER — Ambulatory Visit
Admission: RE | Admit: 2012-01-29 | Discharge: 2012-01-29 | Disposition: A | Discharge status: Home / Self Care | Payer: Medicare Other | Source: Ambulatory Visit | Attending: Thoracic Surgery (Cardiothoracic Vascular Surgery) | Admitting: Thoracic Surgery (Cardiothoracic Vascular Surgery)

## 2012-01-29 DIAGNOSIS — I712 Thoracic aortic aneurysm, without rupture: Secondary | ICD-10-CM

## 2012-01-29 MED ORDER — GADOBENATE DIMEGLUMINE 529 MG/ML IV SOLN
12.0000 mL | Freq: Once | INTRAVENOUS | Status: AC | PRN
  Administered 2012-01-29: 12 mL via INTRAVENOUS

## 2012-01-30 ENCOUNTER — Encounter: Payer: Medicare Other | Admitting: Thoracic Surgery (Cardiothoracic Vascular Surgery)

## 2012-02-01 ENCOUNTER — Other Ambulatory Visit: Payer: Self-pay | Admitting: Internal Medicine

## 2012-02-08 ENCOUNTER — Other Ambulatory Visit: Payer: Self-pay | Admitting: Internal Medicine

## 2012-02-10 ENCOUNTER — Other Ambulatory Visit: Payer: Self-pay | Admitting: Internal Medicine

## 2012-02-12 ENCOUNTER — Encounter: Payer: Medicare Other | Admitting: Thoracic Surgery (Cardiothoracic Vascular Surgery)

## 2012-02-13 ENCOUNTER — Ambulatory Visit (INDEPENDENT_AMBULATORY_CARE_PROVIDER_SITE_OTHER): Payer: Medicare Other | Admitting: Thoracic Surgery (Cardiothoracic Vascular Surgery)

## 2012-02-13 ENCOUNTER — Encounter: Payer: Self-pay | Admitting: Thoracic Surgery (Cardiothoracic Vascular Surgery)

## 2012-02-13 VITALS — BP 140/74 | HR 72 | Resp 18 | Ht 64.5 in | Wt 130.0 lb

## 2012-02-13 DIAGNOSIS — R079 Chest pain, unspecified: Secondary | ICD-10-CM

## 2012-02-13 DIAGNOSIS — I712 Thoracic aortic aneurysm, without rupture: Secondary | ICD-10-CM

## 2012-02-13 NOTE — Progress Notes (Signed)
PCP is Georgetta Haber, MD, MD Referring Provider is Ricard Dillon, MD  Chief Complaint  Patient presents with  . Follow-up    1 Year F/U with MRA Chest, surveillance of Thoracic aortic aneurysm      HPI: 66 yo WF followed for an ascending aortic aneurysm. She returns for an annual follow up. She states that since her last visit she has had difficulty with her blood pressure, which is finally back under control.  She also c/o of a squeezing sensation in her chest with exertion such as carrying laundry upstairs. Also fatigues easily. Has chronic fatigue syndrome, but says this is new and worse than her baseline fatigue.   Past Medical History  Diagnosis Date  . Thyroid disease   . Asthma   . MVP (mitral valve prolapse)   . Migraine   . Dysthymia   . Dysthymia   . Arthritis     Past Surgical History  Procedure Date  . Abdominal hysterectomy   . Tonsilectomy, adenoidectomy, bilateral myringotomy and tubes   . Sinus irrigation   . Hand surgery   . Foot surgery     Family History  Problem Relation Age of Onset  . Arthritis Mother   . Leukemia Father     Social History History  Substance Use Topics  . Smoking status: Never Smoker   . Smokeless tobacco: Not on file  . Alcohol Use: No    Current Outpatient Prescriptions  Medication Sig Dispense Refill  . carisoprodol (SOMA) 350 MG tablet Take 1 tablet (350 mg total) by mouth 2 (two) times daily.  60 tablet  5  . cyanocobalamin (,VITAMIN B-12,) 1000 MCG/ML injection Inject 1 mL (1,000 mcg total) into the muscle 2 (two) times a week.  10 mL  3  . Eszopiclone (ESZOPICLONE) 3 MG TABS Take 1 tablet (3 mg total) by mouth at bedtime. Take immediately before bedtime  30 tablet  5  . Ferrous Sulfate (SLOW RELEASE IRON) 47.5 MG TBCR Take 47.5 mg by mouth daily.        [name] Kitchen FLUoxetine (PROZAC) 40 MG capsule TAKE ONE CAPSULE BY MOUTH EVERY DAY  30 capsule  6  . meperidine (DEMEROL) 50 MG tablet Take 1 tablet (50 mg total) by  mouth every 8 (eight) hours as needed.  60 tablet  0  . METROGEL 1 % gel APPLY TO AFFECTED AREA EVERY DAY  60 g  0  . mometasone (NASONEX) 50 MCG/ACT nasal spray 2 sprays by Nasal route daily.  17 g  6  . montelukast (SINGULAIR) 10 MG tablet Take 1 tablet (10 mg total) by mouth at bedtime.  30 tablet  11  . PROAIR HFA 108 (90 BASE) MCG/ACT inhaler INHALE 2 PUFFS INTO THE LUNGS EVERY 6 (SIX) HOURS AS NEEDED.  8.5 g  6  . promethazine (PHENERGAN) 50 MG tablet Take 50 mg by mouth every 6 (six) hours as needed.        . thyroid (ARMOUR) 60 MG tablet Take 1.5 tablets (90 mg total) by mouth daily. `1.5 daily for 90 total  45 tablet  6  . verapamil (VERELAN PM) 240 MG 24 hr capsule Take 1 capsule (240 mg total) by mouth at bedtime.  90 capsule  3  . Vitamin D, Ergocalciferol, (DRISDOL) 50000 UNITS CAPS TAKE 1 CAPSULE (50,000 UNITS TOTAL) BY MOUTH 2 (TWO) TIMES A WEEK.  10 capsule  3  . DISCONTD: PROAIR HFA 108 (90 BASE) MCG/ACT inhaler INHALE 2 PUFFS INTO  THE LUNGS EVERY 6 (SIX) HOURS AS NEEDED.  8.5 g  6  . DISCONTD: PROAIR HFA 108 (90 BASE) MCG/ACT inhaler INHALE 2 PUFFS INTO THE LUNGS EVERY 6 (SIX) HOURS AS NEEDED.  8.5 g  6    Allergies  Allergen Reactions  . Codeine     REACTION: makes her hyper  . Conjugated Estrogens   . Erythromycin     REACTION: had a rash with emycin, has done ok with other meds in it's class  . Fentanyl     REACTION: resp distress, rash  . Piroxicam     Review of Systems  Constitutional: Positive for fatigue. Negative for fever, chills and unexpected weight change.  Respiratory: Positive for chest tightness and shortness of breath.   Cardiovascular: Positive for chest pain and leg swelling (on airplanes).  Gastrointestinal: Negative.   Musculoskeletal: Positive for arthralgias.  All other systems reviewed and are negative.    BP 140/74  Pulse 72  Resp 18  Ht 5' 4.5" (1.638 m)  Wt 130 lb (58.968 kg)  BMI 21.97 kg/m2  SpO2 98% Physical Exam  Vitals  reviewed. Constitutional: She appears well-developed and well-nourished. No distress.  HENT:  Head: Normocephalic and atraumatic.  Eyes: EOM are normal. Pupils are equal, round, and reactive to light.  Neck: Neck supple. No thyromegaly present.  Cardiovascular: Normal rate and regular rhythm.   Murmur (2/6 systolic) heard. Pulmonary/Chest: Effort normal and breath sounds normal.  Abdominal: Soft. There is no tenderness.  Lymphadenopathy:    She has no cervical adenopathy.  Skin: Skin is warm and dry.     Diagnostic Tests: MR angio- ascending aortic aneurysm unchanged  Impression: 66 year old woman with stable ascending aortic aneurysm. This is 4.2 cm diameter is unchanged since her last MR angiogram year ago.  I am concerned about her chest discomfort. She describes this as a squeezing sensation with exertion, particularly when carrying laundry up stairs. She also complains of fatigue and has been getting tired very easily, out of proportion to her chronic fatigue syndrome. To me her symptoms are very concerning for cardiac artery disease. I will try to contact Dr. Arnoldo Morale today to see about getting her set up to see a cardiologist for a possible stress test or other evaluation.  Plan: Return in one year with MR angiogram chest to followup her ascending aortic aneurysm

## 2012-02-17 ENCOUNTER — Other Ambulatory Visit: Payer: Self-pay | Admitting: Internal Medicine

## 2012-02-18 ENCOUNTER — Encounter: Payer: Self-pay | Admitting: Internal Medicine

## 2012-02-18 ENCOUNTER — Ambulatory Visit (INDEPENDENT_AMBULATORY_CARE_PROVIDER_SITE_OTHER): Payer: Medicare Other | Admitting: Internal Medicine

## 2012-02-18 VITALS — BP 136/80 | HR 72 | Temp 98.3°F | Resp 16 | Ht 64.5 in | Wt 134.0 lb

## 2012-02-18 DIAGNOSIS — R5382 Chronic fatigue, unspecified: Secondary | ICD-10-CM

## 2012-02-18 DIAGNOSIS — E039 Hypothyroidism, unspecified: Secondary | ICD-10-CM

## 2012-02-18 DIAGNOSIS — D509 Iron deficiency anemia, unspecified: Secondary | ICD-10-CM

## 2012-02-18 DIAGNOSIS — R079 Chest pain, unspecified: Secondary | ICD-10-CM

## 2012-02-18 DIAGNOSIS — G43909 Migraine, unspecified, not intractable, without status migrainosus: Secondary | ICD-10-CM

## 2012-02-18 MED ORDER — MEPERIDINE HCL 50 MG PO TABS: 50.0000 mg | ORAL_TABLET | Freq: Three times a day (TID) | ORAL | Status: DC | PRN

## 2012-02-18 NOTE — Progress Notes (Signed)
Subjective:    Patient ID: Marissa [name], female    DOB: [birth year], 66 y.o.   MRN: [id number]  HPI Pt has relayed a hx of raynaud's and is followed by  [facility] for an aortic aneurysm that is stable and a hx of Mitral valve prolapse. Both of these findings are in the setting of fibromyalgia. She saw a cardiologist and was evaluated about 8 years ago. She now reports a feeling of increased chest "heaviness" with walking up stairs that is relieved with rest. She has also noted increased palpitations Blood pressure is stable    Review of Systems  Constitutional: Negative for activity change, appetite change and fatigue.  HENT: Negative for ear pain, congestion, neck pain, postnasal drip and sinus pressure.   Eyes: Negative for redness and visual disturbance.  Respiratory: Positive for chest tightness. Negative for cough, shortness of breath and wheezing.   Gastrointestinal: Negative for abdominal pain and abdominal distention.  Genitourinary: Negative for dysuria, frequency and menstrual problem.  Musculoskeletal: Negative for myalgias, joint swelling and arthralgias.  Skin: Negative for rash and wound.  Neurological: Negative for dizziness, weakness and headaches.  Hematological: Negative for adenopathy. Does not bruise/bleed easily.  Psychiatric/Behavioral: Negative for sleep disturbance and decreased concentration.     Past Medical History  Diagnosis Date  . Thyroid disease   . Asthma   . MVP (mitral valve prolapse)   . Migraine   . Dysthymia   . Dysthymia   . Arthritis     History   Social History  . Marital Status: Married    Spouse Name: N/A    Number of Children: N/A  . Years of Education: N/A   Occupational History  . Not on file.   Social History Main Topics  . Smoking status: Never Smoker   . Smokeless tobacco: Not on file  . Alcohol Use: No  . Drug Use: No  . Sexually Active: No   Other Topics Concern  . Not on file   Social History Narrative  . No  narrative on file    Past Surgical History  Procedure Date  . Abdominal hysterectomy   . Tonsilectomy, adenoidectomy, bilateral myringotomy and tubes   . Sinus irrigation   . Hand surgery   . Foot surgery     Family History  Problem Relation Age of Onset  . Arthritis Mother   . Leukemia Father     Allergies  Allergen Reactions  . Codeine     REACTION: makes her hyper  . Conjugated Estrogens   . Erythromycin     REACTION: had a rash with emycin, has done ok with other meds in it's class  . Fentanyl     REACTION: resp distress, rash  . Piroxicam     Current Outpatient Prescriptions on File Prior to Visit  Medication Sig Dispense Refill  . carisoprodol (SOMA) 350 MG tablet Take 1 tablet (350 mg total) by mouth 2 (two) times daily.  60 tablet  5  . cyanocobalamin (,VITAMIN B-12,) 1000 MCG/ML injection Inject 1 mL (1,000 mcg total) into the muscle 2 (two) times a week.  10 mL  3  . Eszopiclone (ESZOPICLONE) 3 MG TABS Take 1 tablet (3 mg total) by mouth at bedtime. Take immediately before bedtime  30 tablet  5  . Ferrous Sulfate (SLOW RELEASE IRON) 47.5 MG TBCR Take 47.5 mg by mouth daily.        [name] Kitchen FLUoxetine (PROZAC) 40 MG capsule TAKE ONE CAPSULE BY MOUTH EVERY  DAY  30 capsule  6  . METROGEL 1 % gel APPLY TO AFFECTED AREA EVERY DAY  60 g  0  . mometasone (NASONEX) 50 MCG/ACT nasal spray 2 sprays by Nasal route daily.  17 g  6  . montelukast (SINGULAIR) 10 MG tablet Take 1 tablet (10 mg total) by mouth at bedtime.  30 tablet  11  . PROAIR HFA 108 (90 BASE) MCG/ACT inhaler INHALE 2 PUFFS INTO THE LUNGS EVERY 6 (SIX) HOURS AS NEEDED.  8.5 g  6  . promethazine (PHENERGAN) 50 MG tablet Take 50 mg by mouth every 6 (six) hours as needed.        . thyroid (ARMOUR) 60 MG tablet Take 1.5 tablets (90 mg total) by mouth daily. `1.5 daily for 90 total  45 tablet  6  . verapamil (VERELAN PM) 240 MG 24 hr capsule Take 1 capsule (240 mg total) by mouth at bedtime.  90 capsule  3  . Vitamin D,  Ergocalciferol, (DRISDOL) 50000 UNITS CAPS TAKE 1 CAPSULE (50,000 UNITS TOTAL) BY MOUTH 2 (TWO) TIMES A WEEK.  10 capsule  3    BP 136/80  Pulse 72  Temp 98.3 F (36.8 C)  Resp 16  Ht 5' 4.5" (1.638 m)  Wt 134 lb (60.782 kg)  BMI 22.65 kg/m2       Objective:   Physical Exam  Nursing note and vitals reviewed. Constitutional: She is oriented to person, place, and time. She appears well-developed and well-nourished. No distress.  HENT:  Head: Normocephalic and atraumatic.  Right Ear: External ear normal.  Left Ear: External ear normal.  Nose: Nose normal.  Mouth/Throat: Oropharynx is clear and moist.  Eyes: Conjunctivae and EOM are normal. Pupils are equal, round, and reactive to light.  Neck: Normal range of motion. Neck supple. No JVD present. No tracheal deviation present. No thyromegaly present.  Cardiovascular: Normal rate, regular rhythm, normal heart sounds and intact distal pulses.   No murmur heard. Pulmonary/Chest: Effort normal and breath sounds normal. She has no wheezes. She exhibits no tenderness.  Abdominal: Soft. Bowel sounds are normal.  Musculoskeletal: Normal range of motion. She exhibits no edema and no tenderness.  Lymphadenopathy:    She has no cervical adenopathy.  Neurological: She is alert and oriented to person, place, and time. She has normal reflexes. No cranial nerve deficit.  Skin: Skin is warm and dry. She is not diaphoretic.  Psychiatric: She has a normal mood and affect. Her behavior is normal.          Assessment & Plan:  Patient has a long-standing history of hypertension hypothyroidism fibromyalgia.  She has no documented history of coronary disease she does have Raynaud's without real peripheral vascular disease.  She has been followed for the past several years for an aneurysm which is stable at 4 mm and is followed by the cardiothoracic group.  At her recent presentation she noted that she was having exertional chest pain  particularly when walking up stairs that left her short of breath and fatigue and was relieved by rest she indicated that the chest pain was midsternal and not associated with nausea. But was associated with lightheadedness and dizziness  She had a cardiac workup approximately 8 years ago which included a stress test and was normal. Her lipid panel shows a significantly increased HDL in the 70s with a controlled LDL but her total cholesterol slightly elevated.  Because of her risk factors we will proceed with a Cardiolite.

## 2012-02-18 NOTE — Patient Instructions (Signed)
The patient is instructed to continue all medications as prescribed. Schedule followup with check out clerk upon leaving the clinic  

## 2012-02-27 ENCOUNTER — Ambulatory Visit (HOSPITAL_COMMUNITY): Payer: Medicare Other | Attending: Cardiology | Admitting: Radiology

## 2012-02-27 DIAGNOSIS — R079 Chest pain, unspecified: Secondary | ICD-10-CM | POA: Insufficient documentation

## 2012-02-27 DIAGNOSIS — R0989 Other specified symptoms and signs involving the circulatory and respiratory systems: Secondary | ICD-10-CM | POA: Insufficient documentation

## 2012-02-27 DIAGNOSIS — I714 Abdominal aortic aneurysm, without rupture, unspecified: Secondary | ICD-10-CM | POA: Insufficient documentation

## 2012-02-27 DIAGNOSIS — R5383 Other fatigue: Secondary | ICD-10-CM | POA: Insufficient documentation

## 2012-02-27 DIAGNOSIS — R5381 Other malaise: Secondary | ICD-10-CM | POA: Insufficient documentation

## 2012-02-27 DIAGNOSIS — R42 Dizziness and giddiness: Secondary | ICD-10-CM | POA: Insufficient documentation

## 2012-02-27 DIAGNOSIS — R0602 Shortness of breath: Secondary | ICD-10-CM

## 2012-02-27 DIAGNOSIS — R002 Palpitations: Secondary | ICD-10-CM | POA: Insufficient documentation

## 2012-02-27 DIAGNOSIS — I739 Peripheral vascular disease, unspecified: Secondary | ICD-10-CM | POA: Insufficient documentation

## 2012-02-27 DIAGNOSIS — R0609 Other forms of dyspnea: Secondary | ICD-10-CM | POA: Insufficient documentation

## 2012-02-27 DIAGNOSIS — R000 Tachycardia, unspecified: Secondary | ICD-10-CM | POA: Insufficient documentation

## 2012-02-27 DIAGNOSIS — I10 Essential (primary) hypertension: Secondary | ICD-10-CM | POA: Insufficient documentation

## 2012-02-27 MED ORDER — TECHNETIUM TC 99M TETROFOSMIN IV KIT
33.0000 | PACK | Freq: Once | INTRAVENOUS | Status: AC | PRN
  Administered 2012-02-27: 33 via INTRAVENOUS

## 2012-02-27 MED ORDER — TECHNETIUM TC 99M TETROFOSMIN IV KIT
11.0000 | PACK | Freq: Once | INTRAVENOUS | Status: AC | PRN
  Administered 2012-02-27: 11 via INTRAVENOUS

## 2012-02-27 MED ORDER — REGADENOSON 0.4 MG/5ML IV SOLN
0.4000 mg | Freq: Once | INTRAVENOUS | Status: AC
  Administered 2012-02-27: 0.4 mg via INTRAVENOUS

## 2012-02-27 NOTE — Progress Notes (Signed)
[place] [address] [phone]  Cardiology Nuclear Med Study  Marissa [name] is a 66 y.o. female     MRN : [id number]     DOB: 1945-12-09  Procedure Date: 02/27/2012  Nuclear Med Background Indication for Stress Test:  Evaluation for Ischemia History:  No Prior Cardiac History Cardiac Risk Factors: Hypertension and PVD -AAA 4 cm Symptoms:  Chest Pressure with Exertion (last date of chest discomfort last week), Dizziness, DOE, Fatigue with Exertion, Light-Headedness, Palpitations and Rapid HR   Nuclear Pre-Procedure Caffeine/Decaff Intake:  None> 12 hrs NPO After: 7:00am   Lungs:  clear O2 Sat: 98% on room air. IV 0.9% NS with Angio Cath:  22g  IV Site: R Antecubital x 1, tolerated well IV Started by:  Irven Baltimore, RN  Chest Size (in):  34 Cup Size: DDD  Height: 5' 4.5" (1.638 m)  Weight:  132 lb (59.875 kg)  BMI:  Body mass index is 22.31 kg/(m^2). Tech Comments:  n/a    Nuclear Med Study 1 or 2 day study: 1 day  Stress Test Type:  Lexiscan  Reading MD: Darlin Coco, MD  Order Authorizing Provider: Dr.  Benay Pillow  Resting Radionuclide: Technetium 59mTetrofosmin  Resting Radionuclide Dose: 11.0 mCi   Stress Radionuclide:  Technetium 922metrofosmin  Stress Radionuclide Dose: 33.0 mCi           Stress Protocol Rest HR: 63 Stress HR: 85  Rest BP: 124/70 Stress BP: 140/69  Exercise Time (min): n/a METS: n/a   Predicted Max HR: 153 bpm % Max HR: 55.56 bpm Rate Pressure Product: 11900   Dose of Adenosine (mg):  n/a Dose of Lexiscan: 0.4 mg  Dose of Atropine (mg): n/a Dose of Dobutamine: n/a mcg/kg/min (at max HR)  Stress Test Technologist: JaCrissie FiguresRN  Nuclear Technologist:  ToAnnye RuskCNMT     Rest Procedure:  Myocardial perfusion imaging was performed at rest 45 minutes following the intravenous administration of Technetium 9940mtrofosmin. Rest ECG: No acute changes  Stress Procedure:   The patient received IV Lexiscan 0.4 mg over 15-seconds.  Technetium 86m42mrofosmin injected at 30-seconds.  There were no significant changes with Lexiscan.  Quantitative spect images were obtained after a 45 minute delay. Stress ECG: No significant change from baseline ECG  QPS Raw Data Images:  Normal; no motion artifact; normal heart/lung ratio. Stress Images:  Normal homogeneous uptake in all areas of the myocardium. Rest Images:  Normal homogeneous uptake in all areas of the myocardium. Subtraction (SDS):  No evidence of ischemia. Transient Ischemic Dilatation (Normal <1.22):  1.29 Lung/Heart Ratio (Normal <0.45):  0.23  Quantitative Gated Spect Images QGS EDV:  54 ml QGS ESV:  13 ml  Impression Exercise Capacity:  Lexiscan with no exercise. BP Response:  Normal blood pressure response. Clinical Symptoms:  No chest pain. ECG Impression:  No significant ST segment change suggestive of ischemia. Comparison with Prior Nuclear Study: No images to compare  Overall Impression:  Normal stress nuclear study.  LV Ejection Fraction: 77%.  LV Wall Motion:  NL LV Function; NL Wall Motion  [employer]

## 2012-03-20 ENCOUNTER — Other Ambulatory Visit: Payer: Self-pay | Admitting: Internal Medicine

## 2012-03-28 ENCOUNTER — Other Ambulatory Visit: Payer: Self-pay | Admitting: Internal Medicine

## 2012-03-30 ENCOUNTER — Other Ambulatory Visit: Payer: Self-pay | Admitting: *Deleted

## 2012-03-30 MED ORDER — CYANOCOBALAMIN 1000 MCG/ML IJ SOLN: 1000.0000 ug | INTRAMUSCULAR | Status: DC

## 2012-03-31 ENCOUNTER — Other Ambulatory Visit: Payer: Self-pay | Admitting: *Deleted

## 2012-03-31 MED ORDER — CYANOCOBALAMIN 1000 MCG/ML IJ SOLN: 1000.0000 ug | INTRAMUSCULAR | Status: DC

## 2012-04-03 ENCOUNTER — Other Ambulatory Visit: Payer: Self-pay | Admitting: *Deleted

## 2012-04-03 MED ORDER — MEPERIDINE HCL 50 MG PO TABS: 50.0000 mg | ORAL_TABLET | Freq: Three times a day (TID) | ORAL | Status: DC | PRN

## 2012-05-14 ENCOUNTER — Other Ambulatory Visit: Payer: Self-pay | Admitting: *Deleted

## 2012-05-14 MED ORDER — CARISOPRODOL 350 MG PO TABS: 350.0000 mg | ORAL_TABLET | Freq: Two times a day (BID) | ORAL | Status: DC

## 2012-05-19 ENCOUNTER — Ambulatory Visit (INDEPENDENT_AMBULATORY_CARE_PROVIDER_SITE_OTHER): Payer: Medicare Other | Admitting: Internal Medicine

## 2012-05-19 ENCOUNTER — Encounter: Payer: Self-pay | Admitting: Internal Medicine

## 2012-05-19 VITALS — BP 130/70 | HR 72 | Temp 98.6°F | Resp 16 | Ht 64.5 in | Wt 133.0 lb

## 2012-05-19 DIAGNOSIS — M25569 Pain in unspecified knee: Secondary | ICD-10-CM

## 2012-05-19 DIAGNOSIS — IMO0001 Reserved for inherently not codable concepts without codable children: Secondary | ICD-10-CM

## 2012-05-19 DIAGNOSIS — M797 Fibromyalgia: Secondary | ICD-10-CM

## 2012-05-19 DIAGNOSIS — Z9109 Other allergy status, other than to drugs and biological substances: Secondary | ICD-10-CM

## 2012-05-19 DIAGNOSIS — J309 Allergic rhinitis, unspecified: Secondary | ICD-10-CM

## 2012-05-19 NOTE — Patient Instructions (Signed)
The patient is instructed to continue all medications as prescribed. Schedule followup with check out clerk upon leaving the clinic  

## 2012-05-19 NOTE — Progress Notes (Signed)
Subjective:    Patient ID: Marissa [name], female    DOB: 02/25/1946, 66 y.o.   MRN: [id number]  HPI Follow up for fibromyalgia Has been on the floor cleaning grout Has a callus on the knees from working in the floor    Review of Systems  Constitutional: Negative for activity change, appetite change and fatigue.  HENT: Negative for ear pain, congestion, neck pain, postnasal drip and sinus pressure.   Eyes: Negative for redness and visual disturbance.  Respiratory: Negative for cough, shortness of breath and wheezing.   Gastrointestinal: Negative for abdominal pain and abdominal distention.  Genitourinary: Negative for dysuria, frequency and menstrual problem.  Musculoskeletal: Negative for myalgias, joint swelling and arthralgias.  Skin: Negative for rash and wound.  Neurological: Negative for dizziness, weakness and headaches.  Hematological: Negative for adenopathy. Does not bruise/bleed easily.  Psychiatric/Behavioral: Negative for disturbed wake/sleep cycle and decreased concentration.   Past Medical History  Diagnosis Date  . Thyroid disease   . Asthma   . MVP (mitral valve prolapse)   . Migraine   . Dysthymia   . Dysthymia   . Arthritis     History   Social History  . Marital Status: Married    Spouse Name: N/A    Number of Children: N/A  . Years of Education: N/A   Occupational History  . Not on file.   Social History Main Topics  . Smoking status: Never Smoker   . Smokeless tobacco: Not on file  . Alcohol Use: No  . Drug Use: No  . Sexually Active: No   Other Topics Concern  . Not on file   Social History Narrative  . No narrative on file    Past Surgical History  Procedure Date  . Abdominal hysterectomy   . Tonsilectomy, adenoidectomy, bilateral myringotomy and tubes   . Sinus irrigation   . Hand surgery   . Foot surgery     Family History  Problem Relation Age of Onset  . Arthritis Mother   . Leukemia Father     Allergies  Allergen  Reactions  . Codeine     REACTION: makes her hyper  . Conjugated Estrogens   . Erythromycin     REACTION: had a rash with emycin, has done ok with other meds in it's class  . Fentanyl     REACTION: resp distress, rash  . Piroxicam     Current Outpatient Prescriptions on File Prior to Visit  Medication Sig Dispense Refill  . ARMOUR THYROID 60 MG tablet TAKE 1 & 1/2 TABLETS BY MOUTH DAILY  45 tablet  4  . carisoprodol (SOMA) 350 MG tablet Take 1 tablet (350 mg total) by mouth 2 (two) times daily.  60 tablet  5  . cyanocobalamin (,VITAMIN B-12,) 1000 MCG/ML injection Inject 1 mL (1,000 mcg total) into the muscle 2 (two) times a week.  30 mL  3  . Eszopiclone (ESZOPICLONE) 3 MG TABS Take 1 tablet (3 mg total) by mouth at bedtime. Take immediately before bedtime  30 tablet  5  . Ferrous Sulfate (SLOW RELEASE IRON) 47.5 MG TBCR Take 47.5 mg by mouth daily.        [name] Kitchen FLUoxetine (PROZAC) 40 MG capsule TAKE ONE CAPSULE BY MOUTH EVERY DAY  30 capsule  6  . meperidine (DEMEROL) 50 MG tablet Take 1 tablet (50 mg total) by mouth every 8 (eight) hours as needed.  60 tablet  0  . METROGEL 1 % gel APPLY  TO AFFECTED AREA EVERY DAY  60 g  0  . montelukast (SINGULAIR) 10 MG tablet Take 1 tablet (10 mg total) by mouth at bedtime.  30 tablet  11  . PROAIR HFA 108 (90 BASE) MCG/ACT inhaler INHALE 2 PUFFS INTO THE LUNGS EVERY 6 (SIX) HOURS AS NEEDED.  8.5 g  6  . promethazine (PHENERGAN) 50 MG tablet Take 50 mg by mouth every 6 (six) hours as needed.        . thyroid (ARMOUR) 60 MG tablet Take 1.5 tablets (90 mg total) by mouth daily. `1.5 daily for 90 total  45 tablet  6  . verapamil (VERELAN PM) 240 MG 24 hr capsule Take 1 capsule (240 mg total) by mouth at bedtime.  90 capsule  3  . Vitamin D, Ergocalciferol, (DRISDOL) 50000 UNITS CAPS TAKE 1 CAPSULE (50,000 UNITS TOTAL) BY MOUTH 2 (TWO) TIMES A WEEK.  10 capsule  3  . DISCONTD: mometasone (NASONEX) 50 MCG/ACT nasal spray 2 sprays by Nasal route daily.  17 g   6    BP 130/70  Pulse 72  Temp 98.6 F (37 C)  Resp 16  Ht 5' 4.5" (1.638 m)  Wt 133 lb (60.328 kg)  BMI 22.48 kg/m2       Objective:   Physical Exam  Nursing note and vitals reviewed. Constitutional: She is oriented to person, place, and time. She appears well-developed and well-nourished. No distress.  HENT:  Head: Normocephalic and atraumatic.  Right Ear: External ear normal.  Left Ear: External ear normal.  Nose: Nose normal.  Mouth/Throat: Oropharynx is clear and moist.  Eyes: Conjunctivae and EOM are normal. Pupils are equal, round, and reactive to light.  Neck: Normal range of motion. Neck supple. No JVD present. No tracheal deviation present. No thyromegaly present.  Cardiovascular: Normal rate, regular rhythm, normal heart sounds and intact distal pulses.   No murmur heard. Pulmonary/Chest: Effort normal and breath sounds normal. She has no wheezes. She exhibits no tenderness.  Abdominal: Soft. Bowel sounds are normal.  Musculoskeletal: She exhibits edema and tenderness.       escar on knees  Lymphadenopathy:    She has no cervical adenopathy.  Neurological: She is alert and oriented to person, place, and time. She has normal reflexes. No cranial nerve deficit.  Skin: Skin is warm and dry. She is not diaphoretic.  Psychiatric: She has a normal mood and affect. Her behavior is normal.          Assessment & Plan:  Fibromyalgia Discussion   Stable on the  Asthma protocol  But mild flairs of head aches with the heat  Discussion of gluten restriction Mild OA of neck   reviewed the stress test  Which was normal

## 2012-06-09 ENCOUNTER — Other Ambulatory Visit: Payer: Self-pay | Admitting: Internal Medicine

## 2012-06-09 MED ORDER — MEPERIDINE HCL 50 MG PO TABS: 50.0000 mg | ORAL_TABLET | Freq: Three times a day (TID) | ORAL | Status: DC | PRN

## 2012-06-09 NOTE — Telephone Encounter (Signed)
Pt needs new rx meperidine 5m

## 2012-06-09 NOTE — Telephone Encounter (Signed)
Printed and will cal pt to pick up after dr Arnoldo Morale signs

## 2012-06-13 ENCOUNTER — Other Ambulatory Visit: Payer: Self-pay | Admitting: Internal Medicine

## 2012-07-06 ENCOUNTER — Other Ambulatory Visit: Payer: Self-pay | Admitting: *Deleted

## 2012-07-06 MED ORDER — ESZOPICLONE 3 MG PO TABS: 3.0000 mg | ORAL_TABLET | Freq: Every day | ORAL | Status: DC

## 2012-07-08 ENCOUNTER — Telehealth: Payer: Self-pay | Admitting: Family Medicine

## 2012-07-08 NOTE — Telephone Encounter (Signed)
Per dr Arnoldo Morale -she has taken the sonata before (zaleplon)it was ineffective-she tried for about 3 months

## 2012-07-08 NOTE — Telephone Encounter (Signed)
Marissa [name], I received a prior auth request on [facility] for this pt. Per her [place] is a Tier 4 - non preferred med. Below are the 3 preferred meds. Please let me know if she can switch, or if not, why. Has she already tried any of these? Thanks!  zaleplon Rozerem Silenor

## 2012-07-09 ENCOUNTER — Encounter: Payer: Self-pay | Admitting: *Deleted

## 2012-07-09 DIAGNOSIS — G4701 Insomnia due to medical condition: Secondary | ICD-10-CM | POA: Insufficient documentation

## 2012-07-09 DIAGNOSIS — G8929 Other chronic pain: Secondary | ICD-10-CM | POA: Insufficient documentation

## 2012-07-09 NOTE — Telephone Encounter (Signed)
Ok - will submit for PA - thank you!

## 2012-07-22 ENCOUNTER — Other Ambulatory Visit: Payer: Self-pay | Admitting: Internal Medicine

## 2012-07-29 ENCOUNTER — Other Ambulatory Visit: Payer: Self-pay | Admitting: Internal Medicine

## 2012-07-31 ENCOUNTER — Other Ambulatory Visit: Payer: Self-pay | Admitting: *Deleted

## 2012-07-31 ENCOUNTER — Telehealth: Payer: Self-pay | Admitting: Internal Medicine

## 2012-07-31 MED ORDER — MEPERIDINE HCL 50 MG PO TABS: 50.0000 mg | ORAL_TABLET | Freq: Three times a day (TID) | ORAL | Status: DC | PRN

## 2012-07-31 NOTE — Telephone Encounter (Signed)
Printed script and will call pt after dr j  signs and is ready to be picked up

## 2012-07-31 NOTE — Telephone Encounter (Signed)
Pt needs new  rx meperidine 28m. Please call pt when ready for pick up

## 2012-08-04 ENCOUNTER — Other Ambulatory Visit: Payer: Self-pay | Admitting: Internal Medicine

## 2012-08-19 ENCOUNTER — Ambulatory Visit (INDEPENDENT_AMBULATORY_CARE_PROVIDER_SITE_OTHER): Payer: Medicare Other | Admitting: Internal Medicine

## 2012-08-19 ENCOUNTER — Encounter: Payer: Self-pay | Admitting: Internal Medicine

## 2012-08-19 VITALS — BP 124/78 | HR 72 | Temp 98.3°F | Resp 16 | Ht 64.5 in | Wt 134.0 lb

## 2012-08-19 DIAGNOSIS — E039 Hypothyroidism, unspecified: Secondary | ICD-10-CM

## 2012-08-19 DIAGNOSIS — J45909 Unspecified asthma, uncomplicated: Secondary | ICD-10-CM

## 2012-08-19 DIAGNOSIS — D509 Iron deficiency anemia, unspecified: Secondary | ICD-10-CM

## 2012-08-19 DIAGNOSIS — G894 Chronic pain syndrome: Secondary | ICD-10-CM

## 2012-08-19 DIAGNOSIS — Z23 Encounter for immunization: Secondary | ICD-10-CM

## 2012-08-19 MED ORDER — BUPRENORPHINE 15 MCG/HR TD PTWK: 1.0000 | MEDICATED_PATCH | TRANSDERMAL | Status: DC

## 2012-08-19 NOTE — Progress Notes (Signed)
  Subjective:    Patient ID: Marissa [name], female    DOB: Feb 23, 1946, 66 y.o.   MRN: [id number]  HPI Stable blood pressure and vitals Fibromyalgia with flair and failure of pain control on current regimen Is letting the pain "get ahead of her" Palpitations stable monitoring of thyrooid    Review of Systems     Objective:   Physical Exam        Assessment & Plan:  Has persistent pain that is multifactorial fibromyalgia and cervical radiculopathy Has mild results from cortisone injectons  Change to butrans

## 2012-08-19 NOTE — Patient Instructions (Addendum)
The patient is instructed to continue all medications as prescribed. Schedule followup with check out clerk upon leaving the clinic  

## 2012-08-25 ENCOUNTER — Telehealth: Payer: Self-pay | Admitting: Internal Medicine

## 2012-08-25 NOTE — Telephone Encounter (Signed)
Pt called and said that she has misplaced her written script for Buprenorphine (BUTRANS) 15 MCG/HR PTWK. Pt is wondering is she can get a new script?

## 2012-08-25 NOTE — Telephone Encounter (Signed)
Pt informed it will be tomorrow before dr Arnoldo Morale returns to give ok

## 2012-08-26 ENCOUNTER — Other Ambulatory Visit: Payer: Self-pay | Admitting: *Deleted

## 2012-08-26 MED ORDER — BUPRENORPHINE 15 MCG/HR TD PTWK: 1.0000 | MEDICATED_PATCH | TRANSDERMAL | Status: DC

## 2012-08-26 NOTE — Telephone Encounter (Signed)
Pt informed will be ready after 12 noon

## 2012-09-07 ENCOUNTER — Telehealth: Payer: Self-pay | Admitting: Internal Medicine

## 2012-09-07 MED ORDER — MEPERIDINE HCL 50 MG PO TABS: 50.0000 mg | ORAL_TABLET | Freq: Three times a day (TID) | ORAL | Status: DC | PRN

## 2012-09-07 NOTE — Telephone Encounter (Signed)
Script printed and will call pt to pick up after dr Arnoldo Morale signs

## 2012-09-07 NOTE — Telephone Encounter (Signed)
Pt called and will be going out of town this weekend. Pt req refill of meperidine (DEMEROL) 50 MG tablet for pick by Friday of this week.

## 2012-10-09 ENCOUNTER — Encounter: Payer: Self-pay | Admitting: Internal Medicine

## 2012-10-09 ENCOUNTER — Ambulatory Visit (INDEPENDENT_AMBULATORY_CARE_PROVIDER_SITE_OTHER): Payer: Medicare Other | Admitting: Internal Medicine

## 2012-10-09 VITALS — BP 110/70 | HR 68 | Temp 98.2°F | Resp 14 | Ht 64.0 in | Wt 128.0 lb

## 2012-10-09 DIAGNOSIS — M797 Fibromyalgia: Secondary | ICD-10-CM

## 2012-10-09 DIAGNOSIS — G8929 Other chronic pain: Secondary | ICD-10-CM

## 2012-10-09 DIAGNOSIS — IMO0001 Reserved for inherently not codable concepts without codable children: Secondary | ICD-10-CM

## 2012-10-09 MED ORDER — GABAPENTIN 100 MG PO CAPS: 100.0000 mg | ORAL_CAPSULE | Freq: Three times a day (TID) | ORAL | Status: DC

## 2012-10-09 NOTE — Progress Notes (Signed)
Subjective:    Patient ID: Marissa [name], female    DOB: Jun 27, 1946, 66 y.o.   MRN: [id number]  HPI Was not able to tolerate the patch and she had a "disoreinted nausea" reaction Multifactorial pain due to cervical and lumbar disc dx, migraines and fibromyalgia Failed the butrans patches    Review of Systems  Constitutional: Negative for activity change, appetite change and fatigue.  HENT: Negative for ear pain, congestion, neck pain, postnasal drip and sinus pressure.   Eyes: Negative for redness and visual disturbance.  Respiratory: Negative for cough, shortness of breath and wheezing.   Gastrointestinal: Negative for abdominal pain and abdominal distention.  Genitourinary: Negative for dysuria, frequency and menstrual problem.  Musculoskeletal: Negative for myalgias, joint swelling and arthralgias.  Skin: Negative for rash and wound.  Neurological: Negative for dizziness, weakness and headaches.  Hematological: Negative for adenopathy. Does not bruise/bleed easily.  Psychiatric/Behavioral: Negative for sleep disturbance and decreased concentration.   Past Medical History  Diagnosis Date  . Thyroid disease   . Asthma   . MVP (mitral valve prolapse)   . Migraine   . Dysthymia   . Dysthymia   . Arthritis     History   Social History  . Marital Status: Married    Spouse Name: N/A    Number of Children: N/A  . Years of Education: N/A   Occupational History  . Not on file.   Social History Main Topics  . Smoking status: Never Smoker   . Smokeless tobacco: Not on file  . Alcohol Use: No  . Drug Use: No  . Sexually Active: No   Other Topics Concern  . Not on file   Social History Narrative  . No narrative on file    Past Surgical History  Procedure Date  . Abdominal hysterectomy   . Tonsilectomy, adenoidectomy, bilateral myringotomy and tubes   . Sinus irrigation   . Hand surgery   . Foot surgery     Family History  Problem Relation Age of Onset  .  Arthritis Mother   . Leukemia Father     Allergies  Allergen Reactions  . Codeine     REACTION: makes her hyper  . Conjugated Estrogens   . Erythromycin     REACTION: had a rash with emycin, has done ok with other meds in it's class  . Fentanyl     REACTION: resp distress, rash  . Piroxicam     Current Outpatient Prescriptions on File Prior to Visit  Medication Sig Dispense Refill  . ARMOUR THYROID 60 MG tablet TAKE 1 & 1/2 TABLETS BY MOUTH DAILY  45 tablet  4  . carisoprodol (SOMA) 350 MG tablet Take 1 tablet (350 mg total) by mouth 2 (two) times daily.  60 tablet  5  . cyanocobalamin (,VITAMIN B-12,) 1000 MCG/ML injection Inject 1 mL (1,000 mcg total) into the muscle 2 (two) times a week.  30 mL  3  . Eszopiclone (ESZOPICLONE) 3 MG TABS Take 1 tablet (3 mg total) by mouth at bedtime. Take immediately before bedtime  30 tablet  5  . Ferrous Sulfate (SLOW RELEASE IRON) 47.5 MG TBCR Take 47.5 mg by mouth daily.        [name] Kitchen FLUoxetine (PROZAC) 40 MG capsule TAKE ONE CAPSULE TWICE A DAY  60 capsule  7  . meperidine (DEMEROL) 50 MG tablet Take 1 tablet (50 mg total) by mouth every 8 (eight) hours as needed.  60 tablet  0  .  METROGEL 1 % gel APPLY TO AFFECTED AREA EVERY DAY  60 g  0  . mometasone (NASONEX) 50 MCG/ACT nasal spray Place 2 sprays into the nose daily.      . montelukast (SINGULAIR) 10 MG tablet TAKE 1 TABLET (10 MG TOTAL) BY MOUTH AT BEDTIME.  30 tablet  10  . PROAIR HFA 108 (90 BASE) MCG/ACT inhaler INHALE 2 PUFFS INTO THE LUNGS EVERY 6 (SIX) HOURS AS NEEDED.  8.5 g  6  . promethazine (PHENERGAN) 50 MG tablet Take 50 mg by mouth every 6 (six) hours as needed.        . thyroid (ARMOUR) 60 MG tablet Take 1.5 tablets (90 mg total) by mouth daily. `1.5 daily for 90 total  45 tablet  6  . verapamil (VERELAN PM) 240 MG 24 hr capsule Take 1 capsule (240 mg total) by mouth at bedtime.  90 capsule  3  . Vitamin D, Ergocalciferol, (DRISDOL) 50000 UNITS CAPS TAKE 1 CAPSULE (50,000 UNITS  TOTAL) BY MOUTH 2 (TWO) TIMES A WEEK.  10 capsule  3    BP 110/70  Pulse 68  Temp 98.2 F (36.8 C)  Resp 14  Ht 5' 4"  (1.626 m)  Wt 128 lb (58.06 kg)  BMI 21.97 kg/m2        Objective:   Physical Exam  Nursing note and vitals reviewed. Constitutional: She is oriented to person, place, and time. She appears well-developed and well-nourished. No distress.  HENT:  Head: Normocephalic and atraumatic.  Right Ear: External ear normal.  Left Ear: External ear normal.  Nose: Nose normal.  Mouth/Throat: Oropharynx is clear and moist.  Eyes: Conjunctivae normal and EOM are normal. Pupils are equal, round, and reactive to light.  Neck: Normal range of motion. Neck supple. No JVD present. No tracheal deviation present. No thyromegaly present.  Cardiovascular: Normal rate, regular rhythm, normal heart sounds and intact distal pulses.   No murmur heard. Pulmonary/Chest: Effort normal and breath sounds normal. She has no wheezes. She exhibits no tenderness.  Abdominal: Soft. Bowel sounds are normal.  Musculoskeletal: Normal range of motion. She exhibits no edema and no tenderness.  Lymphadenopathy:    She has no cervical adenopathy.  Neurological: She is alert and oriented to person, place, and time. She has normal reflexes. No cranial nerve deficit.  Skin: Skin is warm and dry. She is not diaphoretic.  Psychiatric: She has a normal mood and affect. Her behavior is normal.          Assessment & Plan:  Chronic fibromyalgia pain  Trial of  neurontin Continue current pain protocols

## 2012-10-09 NOTE — Patient Instructions (Signed)
Going to try a very low dose of gabapentin 3 times a day

## 2012-10-28 LAB — HM MAMMOGRAPHY: HM MAMMO: NORMAL

## 2012-11-06 ENCOUNTER — Ambulatory Visit: Payer: Medicare Other | Admitting: Internal Medicine

## 2012-11-09 ENCOUNTER — Other Ambulatory Visit: Payer: Self-pay | Admitting: Internal Medicine

## 2012-11-09 ENCOUNTER — Other Ambulatory Visit: Payer: Self-pay | Admitting: *Deleted

## 2012-11-09 MED ORDER — AZITHROMYCIN 250 MG PO TABS: ORAL_TABLET | ORAL | Status: DC

## 2012-11-09 MED ORDER — MEPERIDINE HCL 50 MG PO TABS: 50.0000 mg | ORAL_TABLET | Freq: Three times a day (TID) | ORAL | Status: DC | PRN

## 2012-11-09 NOTE — Telephone Encounter (Signed)
Printed an d will call pt for pick up when ready

## 2012-11-09 NOTE — Telephone Encounter (Signed)
Pt needs new rx meperidine 50 mg

## 2012-11-22 ENCOUNTER — Other Ambulatory Visit: Payer: Self-pay | Admitting: Internal Medicine

## 2012-11-23 ENCOUNTER — Other Ambulatory Visit: Payer: Self-pay | Admitting: *Deleted

## 2012-12-04 ENCOUNTER — Telehealth: Payer: Self-pay | Admitting: Internal Medicine

## 2012-12-04 NOTE — Telephone Encounter (Signed)
Patient calls to let office know that her insurance will no longer cover her Eszopiclone-to buy it retail is $360.  The insurance company is sending a form to office to allow her provider to justify patient's need for the medication.  She also notes that she has stopped the Gabapentin.  Could not tolerate the sedating daytime effects.

## 2012-12-21 ENCOUNTER — Other Ambulatory Visit: Payer: Self-pay | Admitting: Internal Medicine

## 2012-12-27 ENCOUNTER — Other Ambulatory Visit: Payer: Self-pay | Admitting: Internal Medicine

## 2012-12-29 ENCOUNTER — Telehealth: Payer: Self-pay | Admitting: Internal Medicine

## 2012-12-29 MED ORDER — MEPERIDINE HCL 50 MG PO TABS: 50.0000 mg | ORAL_TABLET | Freq: Three times a day (TID) | ORAL | Status: DC | PRN

## 2012-12-29 NOTE — Telephone Encounter (Signed)
Patient called stating the she need a refill of her meperidine 50 mg 1 po q 8 hrs prn for pain. Please assist.

## 2012-12-29 NOTE — Telephone Encounter (Signed)
Printed-will call pt to pick u p after dr Arnoldo Morale signs

## 2013-01-05 ENCOUNTER — Other Ambulatory Visit: Payer: Self-pay | Admitting: Internal Medicine

## 2013-01-11 ENCOUNTER — Ambulatory Visit (INDEPENDENT_AMBULATORY_CARE_PROVIDER_SITE_OTHER): Payer: Medicare Other | Admitting: Internal Medicine

## 2013-01-11 ENCOUNTER — Encounter: Payer: Self-pay | Admitting: Internal Medicine

## 2013-01-11 VITALS — BP 124/80 | HR 68 | Temp 98.2°F | Wt 129.0 lb

## 2013-01-11 DIAGNOSIS — M542 Cervicalgia: Secondary | ICD-10-CM

## 2013-01-11 DIAGNOSIS — M797 Fibromyalgia: Secondary | ICD-10-CM

## 2013-01-11 DIAGNOSIS — M3500 Sicca syndrome, unspecified: Secondary | ICD-10-CM

## 2013-01-11 DIAGNOSIS — IMO0001 Reserved for inherently not codable concepts without codable children: Secondary | ICD-10-CM

## 2013-01-11 NOTE — Progress Notes (Signed)
Subjective:    Patient ID: Marissa [name], female    DOB: [birth year], 67 y.o.   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  HENT: Negative for ear pain, congestion, neck pain, postnasal drip and sinus pressure.   Eyes: Negative for redness and visual disturbance.  Respiratory: Negative for cough, shortness of breath and wheezing.   Gastrointestinal: Negative for abdominal pain and abdominal distention.  Genitourinary: Negative for dysuria, frequency and menstrual problem.  Musculoskeletal: Positive for myalgias and joint swelling. Negative for arthralgias.       Arrhlagia  Skin: Negative for rash and wound.  Neurological: Negative for dizziness, weakness and headaches.  Hematological: Negative for adenopathy. Does not bruise/bleed easily.  Psychiatric/Behavioral: Negative for sleep disturbance and decreased concentration.   Past Medical History  Diagnosis Date  . Thyroid disease   . Asthma   . MVP (mitral valve prolapse)   . Migraine   . Dysthymia   . Dysthymia   . Arthritis     History   Social History  . Marital Status: Married    Spouse Name: N/A    Number of Children: N/A  . Years of Education: N/A   Occupational History  . Not on file.   Social History Main Topics  . Smoking status: Never Smoker   . Smokeless tobacco: Not on file  . Alcohol Use: No  . Drug Use: No  . Sexually Active: No   Other Topics Concern  . Not on file   Social History Narrative  . No narrative on file    Past Surgical History  Procedure Laterality Date  . Abdominal hysterectomy    . Tonsilectomy, adenoidectomy, bilateral myringotomy and tubes    . Sinus irrigation    . Hand surgery    . Foot surgery      Family History  Problem Relation Age of Onset  .  Arthritis Mother   . Leukemia Father     Allergies  Allergen Reactions  . Codeine     REACTION: makes her hyper  . Conjugated Estrogens   . Erythromycin     REACTION: had a rash with emycin, has done ok with other meds in it's class  . Fentanyl     REACTION: resp distress, rash  . Piroxicam     Current Outpatient Prescriptions on File Prior to Visit  Medication Sig Dispense Refill  . ARMOUR THYROID 60 MG tablet TAKE 1 & 1/2 TABLET BY MOUTH DAILY  45 tablet  4  . carisoprodol (SOMA) 350 MG tablet TAKE 1 TABLET BY MOUTH TWICE A DAY  60 tablet  4  . cyanocobalamin (,VITAMIN B-12,) 1000 MCG/ML injection Inject 1 mL (1,000 mcg total) into the muscle 2 (two) times a week.  30 mL  3  . ESZOPICLONE 3 MG tablet TAKE 1 TABLET BY MOUTH AT BEDTIME IMMEDIATELY BEFORE BEDTIME  30 tablet  5  . Ferrous Sulfate (SLOW RELEASE IRON) 47.5 MG TBCR Take 47.5 mg by mouth daily.        [name] Kitchen FLUoxetine (PROZAC) 40 MG capsule TAKE ONE CAPSULE TWICE A DAY  60 capsule  7  . meperidine (DEMEROL) 50 MG tablet Take 1 tablet (50 mg total) by mouth every 8 (eight) hours as needed.  60 tablet  0  .  mometasone (NASONEX) 50 MCG/ACT nasal spray Place 2 sprays into the nose daily.      . montelukast (SINGULAIR) 10 MG tablet TAKE 1 TABLET (10 MG TOTAL) BY MOUTH AT BEDTIME.  30 tablet  10  . PROAIR HFA 108 (90 BASE) MCG/ACT inhaler INHALE 2 PUFFS INTO THE LUNGS EVERY 6 (SIX) HOURS AS NEEDED.  8.5 each  6  . promethazine (PHENERGAN) 50 MG tablet Take 50 mg by mouth every 6 (six) hours as needed.        . thyroid (ARMOUR) 60 MG tablet Take 1.5 tablets (90 mg total) by mouth daily. `1.5 daily for 90 total  45 tablet  6  . verapamil (CALAN-SR) 240 MG CR tablet TAKE 1 CAPSULE (240 MG TOTAL) BY MOUTH AT BEDTIME.  90 tablet  3  . Vitamin D, Ergocalciferol, (DRISDOL) 50000 UNITS CAPS TAKE 1 CAPSULE (50,000 UNITS TOTAL) BY MOUTH 2 (TWO) TIMES A WEEK.  10 capsule  3   No current facility-administered medications on file prior to visit.     BP 124/80  Pulse 68  Temp(Src) 98.2 F (36.8 C) (Oral)  Wt 129 lb (58.514 kg)  BMI 22.13 kg/m2       Objective:   Physical Exam  Nursing note and vitals reviewed. Constitutional: She is oriented to person, place, and time. She appears well-developed and well-nourished. No distress.  HENT:  Head: Normocephalic and atraumatic.  Right Ear: External ear normal.  Left Ear: External ear normal.  Nose: Nose normal.  Mouth/Throat: Oropharynx is clear and moist.  Eyes: Conjunctivae and EOM are normal. Pupils are equal, round, and reactive to light.  Neck: Normal range of motion. Neck supple. No JVD present. No tracheal deviation present. No thyromegaly present.  Cardiovascular: Normal rate and regular rhythm.   No murmur heard. Pulmonary/Chest: Effort normal and breath sounds normal. She has no wheezes. She exhibits no tenderness.  Abdominal: Soft. Bowel sounds are normal.  Musculoskeletal: She exhibits edema and tenderness.  Popping in neck  Lymphadenopathy:    She has no cervical adenopathy.  Neurological: She is alert and oriented to person, place, and time. She has normal reflexes. No cranial nerve deficit.  Skin: Skin is warm and dry. She is not diaphoretic.  Psychiatric: She has a normal mood and affect. Her behavior is normal.          Assessment & Plan:  monitoring of fibromyalgia and testing to make sue that there has been no transformation given there dry eyes? CREST or Sicca DS DNA and ANA as screening especially with a rash on face. fibromyagia still rated as severe  Neck pain exercises taught

## 2013-01-12 LAB — ANA: Anti Nuclear Antibody(ANA): NEGATIVE

## 2013-01-15 ENCOUNTER — Other Ambulatory Visit: Payer: Self-pay

## 2013-01-15 DIAGNOSIS — I712 Thoracic aortic aneurysm, without rupture: Secondary | ICD-10-CM

## 2013-02-08 ENCOUNTER — Ambulatory Visit
Admission: RE | Admit: 2013-02-08 | Discharge: 2013-02-08 | Disposition: A | Discharge status: Home / Self Care | Payer: Medicare Other | Source: Ambulatory Visit | Attending: Thoracic Surgery (Cardiothoracic Vascular Surgery) | Admitting: Thoracic Surgery (Cardiothoracic Vascular Surgery)

## 2013-02-08 DIAGNOSIS — I712 Thoracic aortic aneurysm, without rupture: Secondary | ICD-10-CM

## 2013-02-08 MED ORDER — GADOBENATE DIMEGLUMINE 529 MG/ML IV SOLN
11.0000 mL | Freq: Once | INTRAVENOUS | Status: AC | PRN
  Administered 2013-02-08: 11 mL via INTRAVENOUS

## 2013-02-09 ENCOUNTER — Encounter: Payer: Self-pay | Admitting: Thoracic Surgery (Cardiothoracic Vascular Surgery)

## 2013-02-09 ENCOUNTER — Ambulatory Visit (INDEPENDENT_AMBULATORY_CARE_PROVIDER_SITE_OTHER): Payer: Medicare Other | Admitting: Thoracic Surgery (Cardiothoracic Vascular Surgery)

## 2013-02-09 VITALS — BP 150/82 | HR 78 | Resp 20 | Ht 64.0 in | Wt 129.0 lb

## 2013-02-09 DIAGNOSIS — I712 Thoracic aortic aneurysm, without rupture: Secondary | ICD-10-CM

## 2013-02-09 NOTE — Progress Notes (Signed)
HPI:  [name] returns for her one-year followup. I first saw her in April 2012 for an ascending aortic aneurysm. It was around 4.3 cm. Most recently she was seen in April 2013 which time she was doing well and is been no change in the size of the aneurysm.  She has multiple other medical issues including the thyroid disease, fibromyalgia, chronic fatigue, and mitral valve prolapse. Last year she was complaining of chest pain and some shortness of breath. She had a cardiac workup which included a nuclear stress study which was negative. She still has the symptoms but has adjusted to it. Overall she says she is doing pretty well.  Past Medical History  Diagnosis Date  . Thyroid disease   . Asthma   . MVP (mitral valve prolapse)   . Migraine   . Dysthymia   . Dysthymia   . Arthritis       Current Outpatient Prescriptions  Medication Sig Dispense Refill  . ARMOUR THYROID 60 MG tablet TAKE 1 & 1/2 TABLET BY MOUTH DAILY  45 tablet  4  . carisoprodol (SOMA) 350 MG tablet TAKE 1 TABLET BY MOUTH TWICE A DAY  60 tablet  4  . cyanocobalamin (,VITAMIN B-12,) 1000 MCG/ML injection Inject 1 mL (1,000 mcg total) into the muscle 2 (two) times a week.  30 mL  3  . cycloSPORINE (RESTASIS) 0.05 % ophthalmic emulsion Place 1 drop into both eyes 2 (two) times daily.      [name] Kitchen doxycycline (DORYX) 100 MG DR capsule Take 100 mg by mouth 2 (two) times daily.      [name] Kitchen ESZOPICLONE 3 MG tablet TAKE 1 TABLET BY MOUTH AT BEDTIME IMMEDIATELY BEFORE BEDTIME  30 tablet  5  . Ferrous Sulfate (SLOW RELEASE IRON) 47.5 MG TBCR Take 47.5 mg by mouth daily.        [name] Kitchen FLUoxetine (PROZAC) 40 MG capsule TAKE ONE CAPSULE TWICE A DAY  60 capsule  7  . meperidine (DEMEROL) 50 MG tablet Take 1 tablet (50 mg total) by mouth every 8 (eight) hours as needed.  60 tablet  0  . mometasone (NASONEX) 50 MCG/ACT nasal spray Place 2 sprays into the nose daily.      . montelukast (SINGULAIR) 10 MG tablet TAKE 1 TABLET (10 MG TOTAL) BY MOUTH  AT BEDTIME.  30 tablet  10  . PROAIR HFA 108 (90 BASE) MCG/ACT inhaler INHALE 2 PUFFS INTO THE LUNGS EVERY 6 (SIX) HOURS AS NEEDED.  8.5 each  6  . promethazine (PHENERGAN) 50 MG tablet Take 50 mg by mouth every 6 (six) hours as needed.        . thyroid (ARMOUR) 60 MG tablet Take 1.5 tablets (90 mg total) by mouth daily. `1.5 daily for 90 total  45 tablet  6  . verapamil (CALAN-SR) 240 MG CR tablet TAKE 1 CAPSULE (240 MG TOTAL) BY MOUTH AT BEDTIME.  90 tablet  3  . Vitamin D, Ergocalciferol, (DRISDOL) 50000 UNITS CAPS TAKE 1 CAPSULE (50,000 UNITS TOTAL) BY MOUTH 2 (TWO) TIMES A WEEK.  10 capsule  3   No current facility-administered medications for this visit.    Physical Exam BP 150/82  Pulse 78  Resp 20  Ht 5' 4"  (1.626 m)  Wt 129 lb (58.514 kg)  BMI 22.13 kg/m2  SpO37 22% 67 year old woman in no acute distress General well-developed well-nourished HEENT malar rash Neck no bruits Lungs clear with equal breath sounds bilaterally Cardiac regular rate and rhythm normal  S1 positive click, 2/6 systolic murmur Peripheral pulses intact No peripheral edema  Diagnostic Tests: MRA 02/08/2013 MRA CHEST WITH OR WITHOUT CONTRAST  Technique: Angiographic images of the chest were obtained using MRA  technique without and with intravenous contrast.  BUN and creatinine were obtained on site at [facility] at  [address]  Results: BUN 9 mg/dL, Creatinine 0.7 mg/dL. GFR is 83  Contrast: 68m MULTIHANCE GADOBENATE DIMEGLUMINE 529 MG/ML IV SOLN  Comparison: 01/29/2012  Findings: The thoracic aorta is stable in appearance. There is  again noted ascending aneurysmal dilatation measuring 4.2-4.3 cm in  greatest dimension. The origins of the great vessels are within  normal limits. The descending aorta is of normal caliber. The MIP  images confirm these findings.  No evidence of dissection or intramural hematoma is noted. The  visualized portions of pulmonary artery are within  normal limits.  The cardiac structures as visualized are within normal limits. The  lungs is visualized are unremarkable. No filling defects are noted  within the pulmonary artery.  IMPRESSION:  Stable appearing aneurysmal disease of the ascending aorta with  maximum diameter of 4.2-4.3 cm.  No new focal abnormality is noted.  Original Report Authenticated By: MInez Catalina M.D.   Impression: 67year old woman with a 4.4 cm descending aortic aneurysm. It has been stable for 2 years now. There is no indication for surgery at this time. This does need to continue be followed. I recommended that she return in one year with a repeat MRA of the chest. She does know that if she were to have severe chest pain that it could be indicative of dissection or rupture she should seek medical attention immediately.  Plan: Return in one year with MRA of chest

## 2013-02-17 ENCOUNTER — Telehealth: Payer: Self-pay | Admitting: Internal Medicine

## 2013-02-17 MED ORDER — MEPERIDINE HCL 50 MG PO TABS: 50.0000 mg | ORAL_TABLET | Freq: Three times a day (TID) | ORAL | Status: DC | PRN

## 2013-02-17 NOTE — Telephone Encounter (Signed)
Pt needs refill of meperidine (DEMEROL) 50 MG tablet

## 2013-02-17 NOTE — Telephone Encounter (Signed)
Printed and will cal pt to pick up after dr Arnoldo Morale signs

## 2013-02-19 ENCOUNTER — Other Ambulatory Visit: Payer: Self-pay | Admitting: Internal Medicine

## 2013-03-02 ENCOUNTER — Other Ambulatory Visit: Payer: Self-pay | Admitting: Internal Medicine

## 2013-03-24 ENCOUNTER — Ambulatory Visit (INDEPENDENT_AMBULATORY_CARE_PROVIDER_SITE_OTHER): Payer: Medicare Other | Admitting: Family

## 2013-03-24 ENCOUNTER — Telehealth: Payer: Self-pay | Admitting: Internal Medicine

## 2013-03-24 ENCOUNTER — Encounter: Payer: Self-pay | Admitting: Family

## 2013-03-24 VITALS — BP 124/76 | HR 72 | Wt 130.0 lb

## 2013-03-24 DIAGNOSIS — J45909 Unspecified asthma, uncomplicated: Secondary | ICD-10-CM

## 2013-03-24 DIAGNOSIS — R059 Cough, unspecified: Secondary | ICD-10-CM

## 2013-03-24 DIAGNOSIS — R05 Cough: Secondary | ICD-10-CM

## 2013-03-24 MED ORDER — METHYLPREDNISOLONE 4 MG PO KIT: PACK | ORAL | Status: AC

## 2013-03-24 MED ORDER — MEPERIDINE HCL 50 MG PO TABS: 50.0000 mg | ORAL_TABLET | Freq: Three times a day (TID) | ORAL | Status: DC | PRN

## 2013-03-24 NOTE — Progress Notes (Signed)
Subjective:    Patient ID: Marissa [name], female    DOB: Apr 07, 1946, 68 y.o.   MRN: [id number]  HPI 67 year old white female presents to PCP for asthma exacerbation x 2 weeks. Reports symptoms of chest tightness and difficulty catching her breath. In addition to respiratory symptoms she reports facial pressure. Symptoms onset while out of town in [place] and have been persistent since. Pt states rescue inhaler has been effective in relieving symptoms but reports having to use it more often. Pt also uses OTC Mucinex and steam/humidification to help relieve pressure felt in sinus area. Reports that symptoms are worse at night.   Review of Systems  Constitutional: Negative.   HENT: Negative.   Eyes: Negative.   Respiratory: Positive for shortness of breath.   Cardiovascular: Negative.   Gastrointestinal: Negative.   Endocrine: Negative.   Genitourinary: Negative.   Musculoskeletal: Negative.   Skin: Negative.   Allergic/Immunologic: Negative.   Neurological: Negative.   Psychiatric/Behavioral: Negative.    Past Medical History  Diagnosis Date  . Thyroid disease   . Asthma   . MVP (mitral valve prolapse)   . Migraine   . Dysthymia   . Dysthymia   . Arthritis     History   Social History  . Marital Status: Married    Spouse Name: N/A    Number of Children: N/A  . Years of Education: N/A   Occupational History  . Not on file.   Social History Main Topics  . Smoking status: Never Smoker   . Smokeless tobacco: Not on file  . Alcohol Use: No  . Drug Use: No  . Sexually Active: No   Other Topics Concern  . Not on file   Social History Narrative  . No narrative on file    Past Surgical History  Procedure Laterality Date  . Abdominal hysterectomy    . Tonsilectomy, adenoidectomy, bilateral myringotomy and tubes    . Sinus irrigation    . Hand surgery    . Foot surgery      Family History  Problem Relation Age of Onset  . Arthritis Mother   . Leukemia  Father     Allergies  Allergen Reactions  . Fentanyl Shortness Of Breath and Rash  . Codeine Other (See Comments)    makes her hyper  . Conjugated Estrogens Itching and Rash  . Erythromycin Rash    had a rash with emycin, has done ok with other meds in it's class  . Piroxicam Itching and Rash    Feldene    Current Outpatient Prescriptions on File Prior to Visit  Medication Sig Dispense Refill  . ARMOUR THYROID 60 MG tablet TAKE 1 & 1/2 TABLET BY MOUTH DAILY  45 tablet  4  . carisoprodol (SOMA) 350 MG tablet TAKE 1 TABLET BY MOUTH TWICE A DAY  60 tablet  4  . cyanocobalamin (,VITAMIN B-12,) 1000 MCG/ML injection Inject 1 mL (1,000 mcg total) into the muscle 2 (two) times a week.  30 mL  3  . cycloSPORINE (RESTASIS) 0.05 % ophthalmic emulsion Place 1 drop into both eyes 2 (two) times daily.      [name] Kitchen doxycycline (DORYX) 100 MG DR capsule Take 100 mg by mouth 2 (two) times daily.      [name] Kitchen ESZOPICLONE 3 MG tablet TAKE 1 TABLET BY MOUTH AT BEDTIME IMMEDIATELY BEFORE BEDTIME  30 tablet  3  . Ferrous Sulfate (SLOW RELEASE IRON) 47.5 MG TBCR Take 47.5 mg by  mouth daily.        [name] Kitchen FLUoxetine (PROZAC) 40 MG capsule TAKE ONE CAPSULE TWICE A DAY  60 capsule  7  . mometasone (NASONEX) 50 MCG/ACT nasal spray Place 2 sprays into the nose daily.      . montelukast (SINGULAIR) 10 MG tablet TAKE 1 TABLET (10 MG TOTAL) BY MOUTH AT BEDTIME.  30 tablet  10  . PROAIR HFA 108 (90 BASE) MCG/ACT inhaler INHALE 2 PUFFS INTO THE LUNGS EVERY 6 (SIX) HOURS AS NEEDED.  8.5 each  6  . promethazine (PHENERGAN) 50 MG tablet Take 50 mg by mouth every 6 (six) hours as needed.        . thyroid (ARMOUR) 60 MG tablet Take 1.5 tablets (90 mg total) by mouth daily. `1.5 daily for 90 total  45 tablet  6  . verapamil (CALAN-SR) 240 MG CR tablet TAKE 1 CAPSULE (240 MG TOTAL) BY MOUTH AT BEDTIME.  90 tablet  3  . Vitamin D, Ergocalciferol, (DRISDOL) 50000 UNITS CAPS TAKE 1 CAPSULE (50,000 UNITS TOTAL) BY MOUTH 2 (TWO) TIMES A WEEK.   10 capsule  3   No current facility-administered medications on file prior to visit.    BP 124/76  Pulse 72  Wt 130 lb (58.968 kg)  BMI 22.3 kg/m2  SpO2 98%chart    Objective:   Physical Exam  Constitutional: She is oriented to person, place, and time. She appears well-developed and well-nourished.  HENT:  Head: Normocephalic and atraumatic.  Eyes: Pupils are equal, round, and reactive to light.  Neck: Normal range of motion.  Cardiovascular: Normal rate, regular rhythm and normal heart sounds.   Pulmonary/Chest: Effort normal and breath sounds normal.  Abdominal: Soft. Bowel sounds are normal.  Musculoskeletal: Normal range of motion.  Neurological: She is alert and oriented to person, place, and time.  Skin: Skin is warm and dry.          Assessment & Plan:    1. Asthma Pt prescribed Medrol pack. Instructed to contact PCP if no relief or worsening of symptoms occur.

## 2013-03-24 NOTE — Telephone Encounter (Signed)
Patient Information:  Caller Name: Arville Go  Phone: [phone]  Patient: Marissa [name], Marissa [name]  Gender: Female  DOB: 07/28/1946  Age: 67 Years  PCP: Benay Pillow (Adults only)  Office Follow Up:  Does the office need to follow up with this patient?: No  Instructions For The Office: N/A  RN Note:  About 05/20 chest tightness has become constant. Used nasonex, mucinex and inhaler (minimally effective). Chest tight with aching on coughing which is sporadic during the day.  Is resting. Has milky nasal drainage with sinus pain.  Is drinking well, appetite is ok.  Is SOB on exertion with changes in environment triggering asthma symptoms--level of asthma attack is a constant mild severity as long as she is aware and controls the environment.  Peak flow is in the 300 level. Care advice given.  Appointment scheduled for today at 11:30 with P Megan Salon.  Dr. Arnoldo Morale was full.  Patient unable to make earlier appointments due to travel time.  Symptoms  Reason For Call & Symptoms: Chest tightness with no wheezing except for upper airways.  Have been traveling. with asthma flare up, sore throat.  Most resolved expect for chest tightness. Exposed to [place] History In EMR: Yes  Reviewed Medications In EMR: Yes  Reviewed Allergies In EMR: Yes  Reviewed Surgeries / Procedures: Yes  Date of Onset of Symptoms: 03/09/2013  Treatments Tried: Used rescue inhaler that is not as effective as she would like  Treatments Tried Worked: No  Guideline(s) Used:  Asthma Attack  Disposition Per Guideline:   See Today or Tomorrow in Office  Reason For Disposition Reached:   Mild asthma attack (e.g., no SOB at rest, mild SOB with walking, speaks normally in sentences, mild wheezing) and persists > 24 hours on appropriate treatment  Advice Given:  Humidifier:   If the air is dry, use a cool mist humidifier to prevent drying of the upper airway.  Humidifier:   If the air is dry, use a cool mist  humidifier to prevent drying of the upper airway.  Call Back If:  You become worse.  Patient Will Follow Care Advice:  YES  Appointment Scheduled:  03/24/2013 11:30:00 Appointment Scheduled Provider:  Roxy [facility])

## 2013-03-29 ENCOUNTER — Other Ambulatory Visit: Payer: Self-pay | Admitting: Internal Medicine

## 2013-04-20 ENCOUNTER — Telehealth: Payer: Self-pay | Admitting: Internal Medicine

## 2013-04-20 ENCOUNTER — Encounter: Payer: Self-pay | Admitting: Family Medicine

## 2013-04-20 ENCOUNTER — Ambulatory Visit (INDEPENDENT_AMBULATORY_CARE_PROVIDER_SITE_OTHER): Payer: Medicare Other | Admitting: Family Medicine

## 2013-04-20 VITALS — BP 140/80 | HR 98 | Temp 98.5°F | Wt 130.0 lb

## 2013-04-20 DIAGNOSIS — J4540 Moderate persistent asthma, uncomplicated: Secondary | ICD-10-CM

## 2013-04-20 DIAGNOSIS — J45909 Unspecified asthma, uncomplicated: Secondary | ICD-10-CM

## 2013-04-20 MED ORDER — MOMETASONE FURO-FORMOTEROL FUM 200-5 MCG/ACT IN AERO: 2.0000 | INHALATION_SPRAY | Freq: Two times a day (BID) | RESPIRATORY_TRACT | Status: DC

## 2013-04-20 NOTE — Telephone Encounter (Signed)
Patient Information:  Caller Name: Arville Go  Phone: [phone]  Patient: [name], Marissa  Gender: Female  DOB: 1946/05/05  Age: 67 Years  PCP: Benay Pillow (Adults only)  Office Follow Up:  Does the office need to follow up with this patient?: No  Instructions For The Office: N/A   Symptoms  Reason For Call & Symptoms: Approximately past 7 weeks has had vIral infection, seen by NP, given Medrol dose pack due to asthma.   Taking Mucinex, Nasocort, Singulair, Claritin, Rescue inhaler using BID,   04/20/13 Asthma better, breathing still tight, dry cough that's croupy at times, painful dry sinuses, headache, white to clear mucus drainage, tired feeling, afebrile.  Peak flow 275 (yellow zone)  Reviewed Health History In EMR: Yes  Reviewed Medications In EMR: Yes  Reviewed Allergies In EMR: Yes  Reviewed Surgeries / Procedures: Yes  Date of Onset of Symptoms: 03/09/2013  Guideline(s) Used:  Asthma Attack  Disposition Per Guideline:   See Today or Tomorrow in Office  Reason For Disposition Reached:   Mild asthma attack (e.g., no SOB at rest, mild SOB with walking, speaks normally in sentences, mild wheezing) and persists > 24 hours on appropriate treatment  Advice Given:  Drinking Liquids:  Try to drink normal amount of liquids (e.g., water). Being adequately hydrated makes it easier to cough up the sticky lung mucus.  Humidifier:   If the air is dry, use a cool mist humidifier to prevent drying of the upper airway.  Quick-Relief Asthma Medicine:   Start your quick-relief medicine (e.g., albuterol, salbutamol) at the first sign of any coughing or shortness of breath (don't wait for wheezing). Use your inhaler (2 puffs each time) or nebulizer every 4 hours. Continue the quick-relief medicine until you have not wheezed or coughed for 48 hours.  The best "cough medicine" for an adult with asthma is always the asthma medicine (Note: Don't use cough suppressants, but cough drops may help a  tickly cough).  Long-Term-Control Asthma Medicine:  If you are using a controller medicine (e.g., inhaled steroids or cromolyn), continue to take it as directed.  Hay Fever  : If you have nasal symptoms from hay fever, it's OK to take antihistamines (Reasons: poor control of allergic rhinitis makes asthma worse whereas antihistamines don't make asthma worse).  Remove Allergens:  Take a shower to remove pollens, animal dander, or other allergens from the body and hair.  Avoid Triggers:  Avoid known triggers of asthma attacks (e.g., tobacco smoke, cats, other pets, feather pillows, exercise).  Call Back If:  Inhaled asthma medicine (nebulizer or inhaler) is needed more often than every 4 hours  You become worse.  Patient Will Follow Care Advice:  YES  Appointment Scheduled:  04/20/2013 13:15:00 Appointment Scheduled Provider:  Alysia Penna [facility])

## 2013-04-20 NOTE — Progress Notes (Signed)
  Subjective:    Patient ID: Marissa [name], female    DOB: 07/29/1946, 67 y.o.   MRN: [id number]  HPI Here for persistent mild SOB, wheezing, and a dry cough this spring and summer. She has to use her rescue inhaler several times a week. She was here a month ago and was given a Medrol dose pack. This helped briefly but the symptoms returned. She does not fee sick, no fever.    Review of Systems  Constitutional: Negative.   HENT: Negative.   Eyes: Negative.   Respiratory: Positive for cough, shortness of breath and wheezing.   Cardiovascular: Negative.        Objective:   Physical Exam  Constitutional: She appears well-developed and well-nourished. No distress.  HENT:  Right Ear: External ear normal.  Left Ear: External ear normal.  Nose: Nose normal.  Mouth/Throat: Oropharynx is clear and moist.  Eyes: Conjunctivae are normal.  Neck: No thyromegaly present.  Cardiovascular: Normal rate, regular rhythm, normal heart sounds and intact distal pulses.   Pulmonary/Chest: Effort normal and breath sounds normal. No respiratory distress. She has no wheezes. She has no rales. She exhibits no tenderness.  Lymphadenopathy:    She has no cervical adenopathy.          Assessment & Plan:  Her asthma is not well controlled. Start on a daily maintenance inhaler. Given samples of Dulera. Follow up with Dr. Arnoldo Morale.

## 2013-04-22 ENCOUNTER — Other Ambulatory Visit: Payer: Self-pay | Admitting: Internal Medicine

## 2013-04-28 ENCOUNTER — Telehealth: Payer: Self-pay | Admitting: Internal Medicine

## 2013-04-28 NOTE — Telephone Encounter (Signed)
Pharm called for clarification: Previous B12 inj was 3000 mcg per/ml New script was b12 was 1000 mcg per/ml Did you mean to change this? Pt was not expecting a change. Discussed w/ pharm that all past injections on record were 1000 mcg/ml, pharm states pt has been getting 3000 mcg/ml for years.

## 2013-04-28 NOTE — Telephone Encounter (Signed)
Chart states 1034mg twice a week.  Pharmacist wants to wait for Dr JArnoldo Moraleon Monday to see what he says.  Note routed to Dr JArnoldo Morale

## 2013-05-03 ENCOUNTER — Other Ambulatory Visit: Payer: Self-pay | Admitting: *Deleted

## 2013-05-03 MED ORDER — CYANOCOBALAMIN 1000 MCG/ML IJ SOLN: 3000.0000 ug | INTRAMUSCULAR | Status: DC

## 2013-05-03 NOTE — Telephone Encounter (Signed)
Called to betsy

## 2013-05-03 NOTE — Telephone Encounter (Signed)
May change to 3000/ml

## 2013-05-03 NOTE — Telephone Encounter (Signed)
Betsy informed

## 2013-05-14 ENCOUNTER — Ambulatory Visit (INDEPENDENT_AMBULATORY_CARE_PROVIDER_SITE_OTHER): Payer: Medicare Other | Admitting: Internal Medicine

## 2013-05-14 ENCOUNTER — Encounter: Payer: Self-pay | Admitting: Internal Medicine

## 2013-05-14 VITALS — BP 130/80 | HR 72 | Temp 98.2°F | Resp 16 | Ht 64.0 in | Wt 130.0 lb

## 2013-05-14 DIAGNOSIS — N3281 Overactive bladder: Secondary | ICD-10-CM

## 2013-05-14 DIAGNOSIS — R5382 Chronic fatigue, unspecified: Secondary | ICD-10-CM

## 2013-05-14 DIAGNOSIS — D509 Iron deficiency anemia, unspecified: Secondary | ICD-10-CM

## 2013-05-14 DIAGNOSIS — R079 Chest pain, unspecified: Secondary | ICD-10-CM

## 2013-05-14 DIAGNOSIS — J45909 Unspecified asthma, uncomplicated: Secondary | ICD-10-CM

## 2013-05-14 DIAGNOSIS — N318 Other neuromuscular dysfunction of bladder: Secondary | ICD-10-CM

## 2013-05-14 DIAGNOSIS — IMO0001 Reserved for inherently not codable concepts without codable children: Secondary | ICD-10-CM

## 2013-05-14 MED ORDER — SOLIFENACIN SUCCINATE 5 MG PO TABS: 10.0000 mg | ORAL_TABLET | Freq: Every day | ORAL | Status: DC

## 2013-05-14 MED ORDER — MEPERIDINE HCL 50 MG PO TABS: 50.0000 mg | ORAL_TABLET | Freq: Three times a day (TID) | ORAL | Status: DC | PRN

## 2013-05-14 MED ORDER — PREGABALIN 25 MG PO CAPS: 25.0000 mg | ORAL_CAPSULE | Freq: Every day | ORAL | Status: DC

## 2013-05-14 NOTE — Progress Notes (Signed)
  Subjective:    Patient ID: Marissa [name], female    DOB: 1946/06/07, 67 y.o.   MRN: [id number]  HPI "shocking sensations in legs and calves" Bladder leakage Increased fatigue and muscle weakness Increased constipation on [place]  Flair of asthma Increased pain    Review of Systems     Objective:   Physical Exam        Assessment & Plan:  Severe flair of CFS  flaired due to respiratory tract infection

## 2013-05-14 NOTE — Patient Instructions (Addendum)
The patient is instructed to continue all medications as prescribed. Schedule followup with check out clerk upon leaving the clinic It appears that you have a flare of your chronic fatigue syndrome.  This is often precipitated by a viral illness likely upper respiratory tract infection that she had a It will take time to recover things that seem to help recovery may include vitamin B12 and thiamine as well as the use of lysine which is an amino acid supplement.  Stretching stretching and then when you think you stretched enough stretching some more helps Mobilization massage therapy or massage therapist as joint mobilization helps  Do not hesitate to consider acupuncture or acupressure as these have been shown to be effective in chronic fatigue

## 2013-05-16 ENCOUNTER — Other Ambulatory Visit: Payer: Self-pay | Admitting: Internal Medicine

## 2013-05-24 ENCOUNTER — Other Ambulatory Visit: Payer: Self-pay | Admitting: Internal Medicine

## 2013-06-02 ENCOUNTER — Other Ambulatory Visit: Payer: Self-pay

## 2013-06-12 ENCOUNTER — Encounter: Payer: Self-pay | Admitting: Internal Medicine

## 2013-06-21 ENCOUNTER — Other Ambulatory Visit: Payer: Self-pay | Admitting: Internal Medicine

## 2013-06-23 ENCOUNTER — Other Ambulatory Visit: Payer: Self-pay | Admitting: Internal Medicine

## 2013-06-29 ENCOUNTER — Other Ambulatory Visit: Payer: Self-pay | Admitting: Internal Medicine

## 2013-06-29 DIAGNOSIS — N3281 Overactive bladder: Secondary | ICD-10-CM

## 2013-06-30 MED ORDER — SOLIFENACIN SUCCINATE 5 MG PO TABS: 10.0000 mg | ORAL_TABLET | Freq: Every day | ORAL | Status: DC

## 2013-06-30 MED ORDER — MOMETASONE FUROATE 50 MCG/ACT NA SUSP: 2.0000 | Freq: Every day | NASAL | Status: DC

## 2013-07-01 ENCOUNTER — Telehealth: Payer: Self-pay | Admitting: Internal Medicine

## 2013-07-02 MED ORDER — MEPERIDINE HCL 50 MG PO TABS: 50.0000 mg | ORAL_TABLET | Freq: Three times a day (TID) | ORAL | Status: DC | PRN

## 2013-07-02 NOTE — Telephone Encounter (Signed)
ok 

## 2013-07-02 NOTE — Telephone Encounter (Signed)
Dr.K will you fill this Rx for Demerol for pt. Dr. Arnoldo Morale is not here.

## 2013-07-02 NOTE — Telephone Encounter (Signed)
Pt is aware MD out of office will route to another MD

## 2013-07-02 NOTE — Telephone Encounter (Signed)
Dr Leanne Chang already did this and its up front for pt to p/u

## 2013-07-19 ENCOUNTER — Ambulatory Visit (INDEPENDENT_AMBULATORY_CARE_PROVIDER_SITE_OTHER): Payer: Medicare Other | Admitting: Internal Medicine

## 2013-07-19 DIAGNOSIS — Z23 Encounter for immunization: Secondary | ICD-10-CM

## 2013-08-01 ENCOUNTER — Encounter: Payer: Self-pay | Admitting: Internal Medicine

## 2013-08-02 ENCOUNTER — Other Ambulatory Visit: Payer: Self-pay | Admitting: *Deleted

## 2013-08-02 MED ORDER — SOLIFENACIN SUCCINATE 10 MG PO TABS: 10.0000 mg | ORAL_TABLET | Freq: Every day | ORAL | Status: DC

## 2013-08-04 ENCOUNTER — Other Ambulatory Visit: Payer: Self-pay | Admitting: *Deleted

## 2013-08-04 MED ORDER — MONTELUKAST SODIUM 10 MG PO TABS: ORAL_TABLET | ORAL | Status: DC

## 2013-08-09 ENCOUNTER — Other Ambulatory Visit: Payer: Self-pay | Admitting: *Deleted

## 2013-08-09 MED ORDER — MONTELUKAST SODIUM 10 MG PO TABS: ORAL_TABLET | ORAL | Status: DC

## 2013-08-13 ENCOUNTER — Other Ambulatory Visit: Payer: Self-pay | Admitting: Internal Medicine

## 2013-08-15 ENCOUNTER — Other Ambulatory Visit: Payer: Self-pay | Admitting: Internal Medicine

## 2013-08-24 ENCOUNTER — Telehealth: Payer: Self-pay | Admitting: *Deleted

## 2013-08-24 ENCOUNTER — Other Ambulatory Visit: Payer: Self-pay | Admitting: Internal Medicine

## 2013-08-24 MED ORDER — PROMETHAZINE HCL 50 MG PO TABS: 50.0000 mg | ORAL_TABLET | Freq: Four times a day (QID) | ORAL | Status: DC | PRN

## 2013-08-24 NOTE — Telephone Encounter (Signed)
Left message on machine Need for dr Arnoldo Morale to sign- will have ready on Thursday am

## 2013-08-26 MED ORDER — MEPERIDINE HCL 50 MG PO TABS: 50.0000 mg | ORAL_TABLET | Freq: Three times a day (TID) | ORAL | Status: DC | PRN

## 2013-08-30 ENCOUNTER — Ambulatory Visit: Payer: Medicare Other | Admitting: Internal Medicine

## 2013-09-01 ENCOUNTER — Ambulatory Visit (INDEPENDENT_AMBULATORY_CARE_PROVIDER_SITE_OTHER): Payer: Medicare Other | Admitting: Internal Medicine

## 2013-09-01 ENCOUNTER — Encounter: Payer: Self-pay | Admitting: Internal Medicine

## 2013-09-01 VITALS — BP 124/80 | HR 72 | Temp 98.2°F | Resp 16 | Ht 64.0 in | Wt 136.0 lb

## 2013-09-01 DIAGNOSIS — E785 Hyperlipidemia, unspecified: Secondary | ICD-10-CM

## 2013-09-01 DIAGNOSIS — E559 Vitamin D deficiency, unspecified: Secondary | ICD-10-CM

## 2013-09-01 DIAGNOSIS — J45909 Unspecified asthma, uncomplicated: Secondary | ICD-10-CM

## 2013-09-01 DIAGNOSIS — R51 Headache: Secondary | ICD-10-CM

## 2013-09-01 DIAGNOSIS — E039 Hypothyroidism, unspecified: Secondary | ICD-10-CM

## 2013-09-01 DIAGNOSIS — IMO0001 Reserved for inherently not codable concepts without codable children: Secondary | ICD-10-CM

## 2013-09-01 DIAGNOSIS — M797 Fibromyalgia: Secondary | ICD-10-CM

## 2013-09-01 DIAGNOSIS — R930 Abnormal findings on diagnostic imaging of skull and head, not elsewhere classified: Secondary | ICD-10-CM

## 2013-09-01 DIAGNOSIS — R519 Headache, unspecified: Secondary | ICD-10-CM

## 2013-09-01 LAB — CBC WITH DIFFERENTIAL/PLATELET
Basophils Relative: 0.5 % (ref 0.0–3.0)
Eosinophils Absolute: 0.1 10*3/uL (ref 0.0–0.7)
Eosinophils Relative: 1.7 % (ref 0.0–5.0)
HCT: 39.3 % (ref 36.0–46.0)
Hemoglobin: 13.7 g/dL (ref 12.0–15.0)
Lymphocytes Relative: 19.2 % (ref 12.0–46.0)
Lymphs Abs: 1 10*3/uL (ref 0.7–4.0)
MCHC: 34.8 g/dL (ref 30.0–36.0)
MCV: 91.9 fl (ref 78.0–100.0)
Monocytes Absolute: 0.5 10*3/uL (ref 0.1–1.0)
Neutro Abs: 3.8 10*3/uL (ref 1.4–7.7)
Neutrophils Relative %: 70.2 % (ref 43.0–77.0)
RBC: 4.27 Mil/uL (ref 3.87–5.11)
RDW: 13.3 % (ref 11.5–14.6)
WBC: 5.4 10*3/uL (ref 4.5–10.5)

## 2013-09-01 LAB — HEPATIC FUNCTION PANEL
ALT: 39 U/L — ABNORMAL HIGH (ref 0–35)
Alkaline Phosphatase: 53 U/L (ref 39–117)
Bilirubin, Direct: 0.1 mg/dL (ref 0.0–0.3)
Total Protein: 7.1 g/dL (ref 6.0–8.3)

## 2013-09-01 LAB — T4, FREE: Free T4: 0.53 ng/dL — ABNORMAL LOW (ref 0.60–1.60)

## 2013-09-01 LAB — BASIC METABOLIC PANEL
CO2: 27 mEq/L (ref 19–32)
Calcium: 9.5 mg/dL (ref 8.4–10.5)
Chloride: 97 mEq/L (ref 96–112)
GFR: 107.5 mL/min (ref >60.00)
Sodium: 133 mEq/L — ABNORMAL LOW (ref 135–145)

## 2013-09-01 LAB — LIPID PANEL
Cholesterol: 251 mg/dL — ABNORMAL HIGH (ref 0–200)
Total CHOL/HDL Ratio: 3
Triglycerides: 129 mg/dL (ref 0.0–149.0)

## 2013-09-01 NOTE — Progress Notes (Signed)
Pre-visit discussion using our clinic review tool. No additional management support is needed unless otherwise documented below in the visit note.  

## 2013-09-01 NOTE — Patient Instructions (Addendum)
The patient is instructed to continue all medications as prescribed. Schedule followup with check out clerk upon leaving the clinic   You are to call me about one to 2 weeks after MRI to confirm the findings and to decide together whether or not we will go to see a neurologist
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
## 2013-09-04 ENCOUNTER — Encounter: Payer: Self-pay | Admitting: Internal Medicine

## 2013-09-10 ENCOUNTER — Encounter: Payer: Self-pay | Admitting: Internal Medicine

## 2013-09-10 DIAGNOSIS — E039 Hypothyroidism, unspecified: Secondary | ICD-10-CM

## 2013-09-10 MED ORDER — THYROID 120 MG PO TABS: 120.0000 mg | ORAL_TABLET | Freq: Every day | ORAL | Status: DC

## 2013-09-11 ENCOUNTER — Other Ambulatory Visit: Payer: Medicare Other

## 2013-09-17 ENCOUNTER — Other Ambulatory Visit: Payer: Medicare Other

## 2013-09-20 ENCOUNTER — Other Ambulatory Visit: Payer: Medicare Other

## 2013-09-22 ENCOUNTER — Encounter: Payer: Self-pay | Admitting: Internal Medicine

## 2013-09-22 ENCOUNTER — Other Ambulatory Visit: Payer: Self-pay | Admitting: *Deleted

## 2013-09-22 MED ORDER — ESZOPICLONE 3 MG PO TABS: ORAL_TABLET | ORAL | Status: DC

## 2013-09-26 ENCOUNTER — Ambulatory Visit
Admission: RE | Admit: 2013-09-26 | Discharge: 2013-09-26 | Disposition: A | Discharge status: Home / Self Care | Payer: Medicare Other | Source: Ambulatory Visit | Attending: Internal Medicine | Admitting: Internal Medicine

## 2013-09-26 DIAGNOSIS — R930 Abnormal findings on diagnostic imaging of skull and head, not elsewhere classified: Secondary | ICD-10-CM

## 2013-09-26 DIAGNOSIS — R519 Headache, unspecified: Secondary | ICD-10-CM

## 2013-09-26 MED ORDER — GADOBENATE DIMEGLUMINE 529 MG/ML IV SOLN
10.0000 mL | Freq: Once | INTRAVENOUS | Status: AC | PRN
  Administered 2013-09-26: 10 mL via INTRAVENOUS

## 2013-10-08 ENCOUNTER — Encounter: Payer: Self-pay | Admitting: Internal Medicine

## 2013-10-10 ENCOUNTER — Other Ambulatory Visit: Payer: Self-pay | Admitting: Internal Medicine

## 2013-11-01 ENCOUNTER — Encounter: Payer: Self-pay | Admitting: Internal Medicine

## 2013-11-01 ENCOUNTER — Telehealth: Payer: Self-pay | Admitting: Internal Medicine

## 2013-11-01 NOTE — Telephone Encounter (Signed)
Pt sent me a mychart request for an appt this month.  She did not make her 3 mo fup when she left in November,  Advised pt I would need to check with you!! Pls advise!

## 2013-11-02 ENCOUNTER — Other Ambulatory Visit: Payer: Self-pay | Admitting: *Deleted

## 2013-11-02 MED ORDER — MEPERIDINE HCL 50 MG PO TABS: 50.0000 mg | ORAL_TABLET | Freq: Three times a day (TID) | ORAL | Status: DC | PRN

## 2013-11-02 NOTE — Telephone Encounter (Signed)
Please tell her we just dont have anything, you can give her the next avaiable.  If she has to be seen, she can see padonda

## 2013-11-05 NOTE — Telephone Encounter (Signed)
Please call pt and give her the appointment on 2/4 where sonya marino is. Cancel sonya and put jo ann there. thanks

## 2013-11-05 NOTE — Telephone Encounter (Signed)
I have 2-4 where sonya marino was (louise brady couldn't come )ok to put there

## 2013-11-05 NOTE — Telephone Encounter (Signed)
yes

## 2013-12-01 ENCOUNTER — Ambulatory Visit (INDEPENDENT_AMBULATORY_CARE_PROVIDER_SITE_OTHER): Payer: Medicare Other | Admitting: Internal Medicine

## 2013-12-01 ENCOUNTER — Encounter: Payer: Self-pay | Admitting: Internal Medicine

## 2013-12-01 VITALS — BP 134/80 | HR 76 | Temp 98.2°F | Resp 16 | Ht 64.0 in | Wt 136.0 lb

## 2013-12-01 DIAGNOSIS — R5382 Chronic fatigue, unspecified: Secondary | ICD-10-CM

## 2013-12-01 DIAGNOSIS — R29898 Other symptoms and signs involving the musculoskeletal system: Secondary | ICD-10-CM

## 2013-12-01 DIAGNOSIS — E039 Hypothyroidism, unspecified: Secondary | ICD-10-CM

## 2013-12-01 DIAGNOSIS — G9332 Myalgic encephalomyelitis/chronic fatigue syndrome: Secondary | ICD-10-CM

## 2013-12-01 LAB — CBC WITH DIFFERENTIAL/PLATELET
Basophils Absolute: 0 10*3/uL (ref 0.0–0.1)
Basophils Relative: 0.5 % (ref 0.0–3.0)
EOS PCT: 0.6 % (ref 0.0–5.0)
Eosinophils Absolute: 0 10*3/uL (ref 0.0–0.7)
HCT: 38.9 % (ref 36.0–46.0)
Hemoglobin: 13.1 g/dL (ref 12.0–15.0)
LYMPHS PCT: 18.8 % (ref 12.0–46.0)
Lymphs Abs: 1.3 10*3/uL (ref 0.7–4.0)
MCHC: 33.7 g/dL (ref 30.0–36.0)
MCV: 95.8 fl (ref 78.0–100.0)
Monocytes Absolute: 0.4 10*3/uL (ref 0.1–1.0)
Monocytes Relative: 6.5 % (ref 3.0–12.0)
NEUTROS PCT: 73.6 % (ref 43.0–77.0)
Neutro Abs: 4.9 10*3/uL (ref 1.4–7.7)
Platelets: 258 10*3/uL (ref 150.0–400.0)
RBC: 4.06 Mil/uL (ref 3.87–5.11)
RDW: 13.6 % (ref 11.5–14.6)
WBC: 6.7 10*3/uL (ref 4.5–10.5)

## 2013-12-01 NOTE — Progress Notes (Signed)
Pre visit review using our clinic review tool, if applicable. No additional management support is needed unless otherwise documented below in the visit note. 

## 2013-12-01 NOTE — Patient Instructions (Signed)
The patient is instructed to continue all medications as prescribed. Schedule followup with check out clerk upon leaving the clinic  

## 2013-12-01 NOTE — Progress Notes (Signed)
Subjective:    Patient ID: Marissa [name], female    DOB: 1946/07/22, 68 y.o.   MRN: [id number]  Asthma There is no cough, shortness of breath or wheezing. Associated symptoms include myalgias. Pertinent negatives include no appetite change, ear pain, headaches or postnasal drip. Her past medical history is significant for asthma.   Follow up CFS and Fibromyalgia  persistent fatigue   Review of Systems  Constitutional: Negative for activity change, appetite change and fatigue.  HENT: Negative for congestion, ear pain, postnasal drip and sinus pressure.   Eyes: Negative for redness and visual disturbance.  Respiratory: Negative for cough, shortness of breath and wheezing.   Gastrointestinal: Positive for abdominal distention. Negative for abdominal pain.  Genitourinary: Negative for dysuria, frequency and menstrual problem.  Musculoskeletal: Positive for arthralgias, myalgias and neck stiffness. Negative for joint swelling and neck pain.  Skin: Negative for rash and wound.  Neurological: Negative for dizziness, weakness and headaches.  Hematological: Negative for adenopathy. Does not bruise/bleed easily.  Psychiatric/Behavioral: Negative for sleep disturbance and decreased concentration.   Past Medical History  Diagnosis Date  . Thyroid disease   . Asthma   . MVP (mitral valve prolapse)   . Migraine   . Dysthymia   . Dysthymia   . Arthritis     History   Social History  . Marital Status: Married    Spouse Name: N/A    Number of Children: N/A  . Years of Education: N/A   Occupational History  . Not on file.   Social History Main Topics  . Smoking status: Never Smoker   . Smokeless tobacco: Never Used  . Alcohol Use: Yes     Comment: occ  . Drug Use: No  . Sexual Activity: No   Other Topics Concern  . Not on file   Social History Narrative  . No narrative on file    Past Surgical History  Procedure Laterality Date  . Abdominal hysterectomy    .  Tonsilectomy, adenoidectomy, bilateral myringotomy and tubes    . Sinus irrigation    . Hand surgery    . Foot surgery      Family History  Problem Relation Age of Onset  . Arthritis Mother   . Leukemia Father     Allergies  Allergen Reactions  . Fentanyl Shortness Of Breath and Rash  . Codeine Other (See Comments)    makes her hyper  . Conjugated Estrogens Itching and Rash  . Erythromycin Rash    had a rash with emycin, has done ok with other meds in it's class  . Piroxicam Itching and Rash    Feldene    Current Outpatient Prescriptions on File Prior to Visit  Medication Sig Dispense Refill  . carisoprodol (SOMA) 350 MG tablet TAKE 1 TABLET BY MOUTH TWICE A DAY  60 tablet  4  . cycloSPORINE (RESTASIS) 0.05 % ophthalmic emulsion Place 1 drop into both eyes 2 (two) times daily.      [name] Kitchen doxycycline (DORYX) 100 MG DR capsule Take 100 mg by mouth 2 (two) times daily.      . Eszopiclone 3 MG TABS TAKE 1 TABLET BY MOUTH AT BEDTIME IMMEDIATELY BEFORE BEDTIME  30 tablet  3  . Ferrous Sulfate (SLOW RELEASE IRON) 47.5 MG TBCR Take 47.5 mg by mouth daily.        [name] Kitchen FLUoxetine (PROZAC) 40 MG capsule TAKE ONE CAPSULE TWICE A DAY  60 capsule  7  . meperidine (DEMEROL)  50 MG tablet Take 1 tablet (50 mg total) by mouth every 8 (eight) hours as needed.  60 tablet  0  . mometasone (NASONEX) 50 MCG/ACT nasal spray Place 2 sprays into the nose daily.  17 g  5  . mometasone-formoterol (DULERA) 100-5 MCG/ACT AERO Inhale 2 puffs into the lungs.      . mometasone-formoterol (DULERA) 200-5 MCG/ACT AERO Inhale 2 puffs into the lungs 2 (two) times daily.  3 Inhaler  0  . montelukast (SINGULAIR) 10 MG tablet TAKE 1 TABLET (10 MG TOTAL) BY MOUTH AT BEDTIME.  90 tablet  3  . PROAIR HFA 108 (90 BASE) MCG/ACT inhaler INHALE 2 PUFFS INTO THE LUNGS EVERY 6 (SIX) HOURS AS NEEDED.  8.5 each  6  . promethazine (PHENERGAN) 50 MG tablet Take 1 tablet (50 mg total) by mouth every 6 (six) hours as needed.  30 tablet  1    . thyroid (ARMOUR) 120 MG tablet Take 1 tablet (120 mg total) by mouth daily before breakfast. `1.5 daily for 90 total  30 tablet  6  . verapamil (CALAN-SR) 240 MG CR tablet TAKE 1 CAPSULE (240 MG TOTAL) BY MOUTH AT BEDTIME.  90 tablet  3  . Vitamin D, Ergocalciferol, (DRISDOL) 50000 UNITS CAPS capsule TAKE 1 CAPSULE (50,000 UNITS TOTAL) BY MOUTH 2 (TWO) TIMES A WEEK.  10 capsule  3   No current facility-administered medications on file prior to visit.    BP 134/80  Pulse 76  Temp(Src) 98.2 F (36.8 C)  Resp 16  Ht 5' 4"  (1.626 m)  Wt 136 lb (61.689 kg)  BMI 23.33 kg/m2       Objective:   Physical Exam  Nursing note and vitals reviewed. Constitutional: She appears well-nourished.  Weight gain  HENT:  Head: Normocephalic and atraumatic.  Eyes: Conjunctivae are normal.  Neck: Neck supple. No thyromegaly present.  Pulmonary/Chest: Breath sounds normal.  Musculoskeletal:  increased muscle pain and tenderness          Assessment & Plan:  CFS  reveiwed thyroid Weight gain  Stretching  Discussed the MRI results  No change in medicine plan

## 2013-12-02 LAB — TSH: TSH: 0.75 u[IU]/mL (ref 0.35–5.50)

## 2013-12-02 LAB — T4, FREE: FREE T4: 0.52 ng/dL — AB (ref 0.60–1.60)

## 2013-12-08 ENCOUNTER — Other Ambulatory Visit: Payer: Self-pay | Admitting: Internal Medicine

## 2013-12-21 ENCOUNTER — Encounter: Payer: Self-pay | Admitting: Internal Medicine

## 2013-12-31 ENCOUNTER — Other Ambulatory Visit: Payer: Self-pay | Admitting: Internal Medicine

## 2013-12-31 MED ORDER — MEPERIDINE HCL 50 MG PO TABS: 50.0000 mg | ORAL_TABLET | Freq: Three times a day (TID) | ORAL | Status: DC | PRN

## 2014-01-12 ENCOUNTER — Other Ambulatory Visit: Payer: Self-pay

## 2014-01-12 DIAGNOSIS — I712 Thoracic aortic aneurysm, without rupture, unspecified: Secondary | ICD-10-CM

## 2014-01-18 ENCOUNTER — Other Ambulatory Visit: Payer: Self-pay | Admitting: Internal Medicine

## 2014-02-09 ENCOUNTER — Other Ambulatory Visit: Payer: Self-pay | Admitting: Internal Medicine

## 2014-02-22 ENCOUNTER — Encounter: Payer: Self-pay | Admitting: Thoracic Surgery (Cardiothoracic Vascular Surgery)

## 2014-02-22 ENCOUNTER — Ambulatory Visit
Admission: RE | Admit: 2014-02-22 | Discharge: 2014-02-22 | Disposition: A | Discharge status: Home / Self Care | Payer: Medicare Other | Source: Ambulatory Visit | Attending: Thoracic Surgery (Cardiothoracic Vascular Surgery) | Admitting: Thoracic Surgery (Cardiothoracic Vascular Surgery)

## 2014-02-22 ENCOUNTER — Ambulatory Visit (INDEPENDENT_AMBULATORY_CARE_PROVIDER_SITE_OTHER): Payer: Medicare Other | Admitting: Thoracic Surgery (Cardiothoracic Vascular Surgery)

## 2014-02-22 VITALS — BP 141/89 | HR 87 | Resp 16 | Ht 64.0 in | Wt 130.0 lb

## 2014-02-22 DIAGNOSIS — I10 Essential (primary) hypertension: Secondary | ICD-10-CM

## 2014-02-22 DIAGNOSIS — I712 Thoracic aortic aneurysm, without rupture, unspecified: Secondary | ICD-10-CM

## 2014-02-22 DIAGNOSIS — R0989 Other specified symptoms and signs involving the circulatory and respiratory systems: Secondary | ICD-10-CM | POA: Insufficient documentation

## 2014-02-22 MED ORDER — LISINOPRIL 5 MG PO TABS: 5.0000 mg | ORAL_TABLET | Freq: Every day | ORAL | Status: DC

## 2014-02-22 MED ORDER — GADOBENATE DIMEGLUMINE 529 MG/ML IV SOLN
12.0000 mL | Freq: Once | INTRAVENOUS | Status: AC | PRN
  Administered 2014-02-22: 12 mL via INTRAVENOUS

## 2014-02-22 NOTE — Progress Notes (Signed)
HPI:  Mrs. Spader returns today for a scheduled one year followup visit  She is a 68 year old woman who was found in 2012 to have a 4.1-4.2 cm ascending aortic aneurysm. We have been following that on an annual basis since then. She was last in the office in April 2014 which time the aneurysm was unchanged.  She says that she has not been feeling well. She's been having more pain related to her fibromyalgia. She says that she has felt tired and rundown. She's gained some weight. She's not had any chest pain, shortness of breath, or swelling in her legs.  Past Medical History  Diagnosis Date  . Thyroid disease   . Asthma   . MVP (mitral valve prolapse)   . Migraine   . Dysthymia   . Dysthymia   . Arthritis       Current Outpatient Prescriptions  Medication Sig Dispense Refill  . carisoprodol (SOMA) 350 MG tablet PRN      . cycloSPORINE (RESTASIS) 0.05 % ophthalmic emulsion Place 1 drop into both eyes 2 (two) times daily.      [name] Kitchen doxycycline (DORYX) 100 MG DR capsule Take 100 mg by mouth as needed.       . Eszopiclone 3 MG TABS TAKE 1 TABLET AT BEDTIME  30 tablet  4  . Ferrous Sulfate (SLOW RELEASE IRON) 47.5 MG TBCR Take 47.5 mg by mouth daily.        [name] Kitchen FLUoxetine (PROZAC) 40 MG capsule ONCE A DAY      . meperidine (DEMEROL) 50 MG tablet Take 1 tablet (50 mg total) by mouth every 8 (eight) hours as needed.  60 tablet  0  . mometasone (NASONEX) 50 MCG/ACT nasal spray Place 2 sprays into the nose daily.  17 g  5  . mometasone-formoterol (DULERA) 100-5 MCG/ACT AERO Inhale 2 puffs into the lungs.      . mometasone-formoterol (DULERA) 200-5 MCG/ACT AERO Inhale 2 puffs into the lungs 2 (two) times daily.  3 Inhaler  0  . montelukast (SINGULAIR) 10 MG tablet TAKE 1 TABLET (10 MG TOTAL) BY MOUTH AT BEDTIME.  90 tablet  3  . PROAIR HFA 108 (90 BASE) MCG/ACT inhaler INHALE 2 PUFFS INTO THE LUNGS EVERY 6 (SIX) HOURS AS NEEDED.  8.5 each  6  . promethazine (PHENERGAN) 50 MG tablet Take 1  tablet (50 mg total) by mouth every 6 (six) hours as needed.  30 tablet  1  . thyroid (ARMOUR) 120 MG tablet Take 1 tablet (120 mg total) by mouth daily before breakfast. `1.5 daily for 90 total  30 tablet  6  . verapamil (CALAN-SR) 240 MG CR tablet TAKE 1 TABLET BY MOUTH AT BEDTIME  90 tablet  3  . Vitamin D, Ergocalciferol, (DRISDOL) 50000 UNITS CAPS capsule TAKE 1 CAPSULE (50,000 UNITS TOTAL) BY MOUTH 2 (TWO) TIMES A WEEK.  10 capsule  3  . lisinopril (PRINIVIL,ZESTRIL) 5 MG tablet Take 1 tablet (5 mg total) by mouth daily.  60 tablet  6  . METRONIDAZOLE, TOPICAL, 0.75 % LOTN        No current facility-administered medications for this visit.    Physical Exam BP 141/89  Pulse 87  Resp 16  Ht 5' 4"  (1.626 m)  Wt 130 lb (58.968 kg)  BMI 22.30 kg/m2  SpO45 62% 68 year old woman in no acute distress Alert and oriented x3 with no focal deficits Cardiac regular rate and rhythm normal S1 and S2, no murmur Lungs clear with  equal breath sounds bilaterally No carotid bruits 2+ pulses throughout  Diagnostic Tests: MR Angio of chest 02/22/2014 FINDINGS:  Stable aneurysmal dilatation of the ascending thoracic aorta,  measuring 4.2 x 3.9 cm at a similar level as the prior study. No  associated dissection or intramural hemorrhage. Visualized central  pulmonary arteries appear patent. The major branch vessels remain  patent. Transverse and descending thoracic aorta are normal in  caliber.  Normal heart size. No pericardial or pleural effusion. No acute  finding demonstrated within the thorax. No chest wall abnormality or  asymmetry. Visualized lungs demonstrate no acute finding.  IMPRESSION:  Stable 4.2 cm ascending thoracic aortic aneurysm. No interval change  or acute finding.  Electronically Signed  By: Daryll Brod M.D.  On: 02/22/2014 12:03  Impression:  68 year old woman with a 4.2 cm ascending aortic aneurysm. This is unchanged over the past 3 years. She does need to continue to  followup on annual basis. Her blood pressure today was elevated at 141/89.   She is on verapamil 240 mg nightly. She has had adverse effects with beta blockers in the past Lopressor and Inderal, and does not want to try another beta blocker. I really don't 1 her blood pressure at high so I am going to start her on low-dose lisinopril 5 mg a day.   Plan:  lisinopril 5 mg daily  Return in one year with MRA of chest

## 2014-03-02 ENCOUNTER — Ambulatory Visit: Payer: Medicare Other | Admitting: Internal Medicine

## 2014-03-23 ENCOUNTER — Other Ambulatory Visit: Payer: Self-pay | Admitting: Internal Medicine

## 2014-03-23 ENCOUNTER — Encounter: Payer: Self-pay | Admitting: Internal Medicine

## 2014-03-24 ENCOUNTER — Other Ambulatory Visit: Payer: Self-pay | Admitting: Internal Medicine

## 2014-03-28 ENCOUNTER — Other Ambulatory Visit: Payer: Self-pay | Admitting: Internal Medicine

## 2014-03-28 MED ORDER — MEPERIDINE HCL 50 MG PO TABS: 50.0000 mg | ORAL_TABLET | Freq: Three times a day (TID) | ORAL | Status: DC | PRN

## 2014-03-28 NOTE — Telephone Encounter (Signed)
Demerol last filled on 3.6.2015.  Will you sign please?

## 2014-03-28 NOTE — Telephone Encounter (Signed)
Ok i will sign

## 2014-03-28 NOTE — Telephone Encounter (Signed)
Called and spoke with pt and pt is aware ready for pick up.

## 2014-03-29 NOTE — Telephone Encounter (Signed)
It was done by Padonda on 03/28/14

## 2014-04-15 ENCOUNTER — Other Ambulatory Visit: Payer: Self-pay | Admitting: Internal Medicine

## 2014-05-11 ENCOUNTER — Telehealth: Payer: Self-pay | Admitting: Internal Medicine

## 2014-05-11 MED ORDER — MONTELUKAST SODIUM 10 MG PO TABS: ORAL_TABLET | ORAL | Status: DC

## 2014-05-11 NOTE — Telephone Encounter (Signed)
rx sent in electronically 

## 2014-05-11 NOTE — Telephone Encounter (Signed)
[id number] [place] on montelukast (SINGULAIR) 10 MG tablet

## 2014-05-16 ENCOUNTER — Encounter: Payer: Self-pay | Admitting: Internal Medicine

## 2014-05-16 MED ORDER — MEPERIDINE HCL 50 MG PO TABS: 50.0000 mg | ORAL_TABLET | Freq: Three times a day (TID) | ORAL | Status: DC | PRN

## 2014-05-16 NOTE — Telephone Encounter (Signed)
Ok per Dr. Arnoldo Morale to refill medication.  Advised pt to schedule appt with Dr. Yong Channel when she comes in to pick up rx.

## 2014-06-15 ENCOUNTER — Ambulatory Visit: Payer: Medicare Other | Admitting: Internal Medicine

## 2014-06-16 ENCOUNTER — Other Ambulatory Visit: Payer: Self-pay | Admitting: Internal Medicine

## 2014-06-30 ENCOUNTER — Encounter: Payer: Self-pay | Admitting: Internal Medicine

## 2014-06-30 NOTE — Telephone Encounter (Signed)
Dr. Raliegh Ip, please see message and advise if you will refill Rx's for pt.

## 2014-07-01 ENCOUNTER — Other Ambulatory Visit: Payer: Self-pay | Admitting: Internal Medicine

## 2014-07-01 ENCOUNTER — Other Ambulatory Visit: Payer: Self-pay | Admitting: Family Medicine

## 2014-07-01 NOTE — Telephone Encounter (Signed)
I do not prescribe either of these medications;  suggest patient call Dr. Geoffry Paradise, who will be her new primary care provider

## 2014-07-01 NOTE — Telephone Encounter (Signed)
Please see message from Dr.K.

## 2014-07-06 ENCOUNTER — Encounter: Payer: Self-pay | Admitting: Family Medicine

## 2014-07-06 ENCOUNTER — Ambulatory Visit (INDEPENDENT_AMBULATORY_CARE_PROVIDER_SITE_OTHER): Payer: Medicare Other | Admitting: Family Medicine

## 2014-07-06 ENCOUNTER — Telehealth: Payer: Self-pay | Admitting: Family Medicine

## 2014-07-06 VITALS — BP 150/82 | HR 80 | Temp 98.4°F | Wt 130.0 lb

## 2014-07-06 DIAGNOSIS — IMO0001 Reserved for inherently not codable concepts without codable children: Secondary | ICD-10-CM

## 2014-07-06 DIAGNOSIS — G894 Chronic pain syndrome: Secondary | ICD-10-CM

## 2014-07-06 MED ORDER — CARISOPRODOL 350 MG PO TABS: ORAL_TABLET | ORAL | Status: DC

## 2014-07-06 MED ORDER — MEPERIDINE HCL 50 MG PO TABS: 50.0000 mg | ORAL_TABLET | Freq: Three times a day (TID) | ORAL | Status: DC | PRN

## 2014-07-06 NOTE — Assessment & Plan Note (Signed)
Etiology fibromyalgia. Discussed with patient that Demerol is not a preferred agent for chronic pain. Discussed I would be willing to trial tramadol or perhaps Norco. I did provide a one month refill Demerol and soma. Patient is considering establishing with another provider. Placed a pain management referral today in case patient decides to continue her chronic care with me.

## 2014-07-06 NOTE — Telephone Encounter (Signed)
No

## 2014-07-06 NOTE — Telephone Encounter (Signed)
Pt would like to switch to dr Regis Bill. Pt is on pain medications

## 2014-07-06 NOTE — Progress Notes (Signed)
Garret Reddish, MD Phone: [phone]  Subjective:   Marissa [name] is a 68 y.o. year old very pleasant female patient who presents with the following:  Fibromyalgia Chronic fatigue syndrome Chronic pain syndrome Patient with a complex history starting around age 76. She states she got a virus at that time and soon thereafter started having difficulty climbing stairs. She states her legs felt like concrete. He states she also had difficulty thinking and slurring of her words. She initially states she had no pain at that time but later states there was always pain but not as severe later years. She was seen at [facility] but states they were unable to help. She had many years with multiple therapies with Dr. Arnoldo Morale. She was referred to neurology and had an EMG which did not show a diagnosis. She later had a period of weight loss and had an extensive workup for cancer and rheumatological disease. She eventually went to Dr. Brendia Sacks in [facility] who specializes in fibromyalgia and chronic fatigue. Patient saw him for years but eventually grew frustrated with a long drive for symptom management only so transition care to Dr. Arnoldo Morale. She states she has been plagued by chronic fatigue and chronic pain for approximately 20 years while she used to be very active. She is a former Therapist, sports.  She states she has pain in her neck and shoulders as well as her low back. She's tried Vicodin which did not help. She tried fentanyl patches, and Neurontin, Cymbalta, Lyrica, Effexor with no relief or intolerable side effects. She also describes orthostatic intolerance which is often worsened by medication. She has been to pain management over 18 years ago. She is also seen [place] [facility] in the past but states she had worsening symptoms after an exercise program with them. She does state she follows with the neurosurgeon for her spinal stenosis at C3 and C5. She continues to say that her cognitive  abilities are affected by stressful events. She regularly does yoga as well as uses ice, heat, stretching.  She states the only regimen that has ever worked for her is meperidine and soma.  ROS-no fever chills unintentional weight loss  Past Medical History- Patient Active Problem List   Diagnosis Date Noted  . Chronic pain syndrome 07/06/2014    Priority: High  . Chronic fatigue syndrome 04/21/2008    Priority: High  . FIBROMYALGIA 03/13/2007    Priority: High  . Hypertension 02/22/2014  . Insomnia secondary to chronic pain 07/09/2012  . B12 deficiency 04/03/2011  . UNSPECIFIED VITAMIN D DEFICIENCY 07/25/2010  . PALPITATIONS, RECURRENT 09/20/2009  . ANEMIA, IRON DEFICIENCY 04/25/2009  . ALLERGIC RHINITIS 02/01/2009  . 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 HYPERSOMNIA 12/27/2008  . CHEST PAIN, LEFT 12/17/2008  . UNS ADVRS EFF UNS RX MEDICINAL&BIOLOGICAL SBSTNC 11/28/2008  . HAMMER TOE 07/29/2008  . ADJ DISORDER WITH MIXED ANXIETY & DEPRESSED MOOD 01/19/2008  . MIGRAINE, CLASSICAL 01/19/2008  . OSTEOARTHRITIS 01/19/2008  . [place] DISEASE, CERVICAL 01/19/2008  . HYPOTHYROIDISM 03/13/2007  . ASTHMA 03/13/2007   Medications- reviewed and updated Current Outpatient Prescriptions  Medication Sig Dispense Refill  . ARMOUR THYROID 120 MG tablet TAKE 1 TABLET DAILY BEFORE BREAKFAST  30 tablet  6  . carisoprodol (SOMA) 350 MG tablet TID PRN  30 tablet  1  . cycloSPORINE (RESTASIS) 0.05 % ophthalmic emulsion Place 1 drop into both eyes 2 (two) times daily.      [name] Kitchen doxycycline (DORYX) 100 MG DR  capsule Take 100 mg by mouth as needed.       . Eszopiclone 3 MG TABS TAKE 1 TABLET AT BEDTIME  30 tablet  1  . FLUoxetine (PROZAC) 40 MG capsule ONCE A DAY      . lisinopril (PRINIVIL,ZESTRIL) 5 MG tablet Take 1 tablet (5 mg total) by mouth daily.  60 tablet  6  . meperidine (DEMEROL) 50 MG tablet Take 1 tablet (50 mg total) by mouth every 8 (eight) hours as needed.  60 tablet  0  . mometasone  (NASONEX) 50 MCG/ACT nasal spray Place 2 sprays into the nose daily.  17 g  5  . mometasone-formoterol (DULERA) 100-5 MCG/ACT AERO Inhale 2 puffs into the lungs.      . montelukast (SINGULAIR) 10 MG tablet TAKE 1 TABLET (10 MG TOTAL) BY MOUTH AT BEDTIME.  90 tablet  3  . PROAIR HFA 108 (90 BASE) MCG/ACT inhaler INHALE 2 PUFFS INTO THE LUNGS EVERY 6 (SIX) HOURS AS NEEDED.  8.5 each  6  . verapamil (CALAN-SR) 240 MG CR tablet TAKE 1 TABLET BY MOUTH AT BEDTIME  90 tablet  3  . Ferrous Sulfate (SLOW RELEASE IRON) 47.5 MG TBCR Take 47.5 mg by mouth daily.        [name] Kitchen METRONIDAZOLE, TOPICAL, 0.75 % LOTN       . promethazine (PHENERGAN) 50 MG tablet Take 1 tablet (50 mg total) by mouth every 6 (six) hours as needed.  30 tablet  1  . Vitamin D, Ergocalciferol, (DRISDOL) 50000 UNITS CAPS capsule TAKE 1 CAPSULE (50,000 UNITS TOTAL) BY MOUTH 2 (TWO) TIMES A WEEK.  10 capsule  3   No current facility-administered medications for this visit.    Objective: BP 150/82  Pulse 80  Temp(Src) 98.4 F (36.9 C)  Wt 130 lb (58.968 kg) Gen: NAD, resting comfortably in chair, normal weight CV: RRR no murmurs rubs or gallops Lungs: nonlabored  Ext: no edema  Assessment/Plan:  Chronic pain syndrome Etiology fibromyalgia. Discussed with patient that Demerol is not a preferred agent for chronic pain. Discussed I would be willing to trial tramadol or perhaps Norco. I did provide a one month refill Demerol and soma. Patient is considering establishing with another provider. Placed a pain management referral today in case patient decides to continue her chronic care with me.   >50% of 35 minute office visit was spent on counseling (benefits/risks of chronic medications patient is on, discussion of alternative therapies and benefits/risks) and coordination of care   Orders Placed This Encounter  Procedures  . Ambulatory referral to Pain Clinic    Referral Priority:  Routine    Referral Type:  Consultation     Referral Reason:  Specialty Services Required    Requested Specialty:  Pain Medicine    Number of Visits Requested:  1    Meds ordered this encounter  Medications  . carisoprodol (SOMA) 350 MG tablet    Sig: TID PRN    Dispense:  30 tablet    Refill:  1  . meperidine (DEMEROL) 50 MG tablet    Sig: Take 1 tablet (50 mg total) by mouth every 8 (eight) hours as needed.    Dispense:  60 tablet    Refill:  0

## 2014-07-06 NOTE — Patient Instructions (Signed)
Great to meet you!  1 month refill. Referred to pain clinic. You could also consider an option of finding a provider that prescribes demerol or medication beyond tramadol/norco on a regular basis. Unfortunately, I do not prescribe these medicines on a daily basis.

## 2014-07-07 NOTE — Telephone Encounter (Signed)
lmom for pt to cb

## 2014-07-08 NOTE — Telephone Encounter (Signed)
Pt is aware.  

## 2014-07-12 ENCOUNTER — Encounter: Payer: Self-pay | Admitting: Family Medicine

## 2014-07-13 ENCOUNTER — Encounter: Payer: Self-pay | Admitting: Family Medicine

## 2014-08-03 ENCOUNTER — Encounter: Payer: Self-pay | Admitting: Internal Medicine

## 2014-08-03 ENCOUNTER — Ambulatory Visit (INDEPENDENT_AMBULATORY_CARE_PROVIDER_SITE_OTHER): Payer: Medicare Other | Admitting: Internal Medicine

## 2014-08-03 ENCOUNTER — Ambulatory Visit (INDEPENDENT_AMBULATORY_CARE_PROVIDER_SITE_OTHER)
Admission: RE | Admit: 2014-08-03 | Discharge: 2014-08-03 | Disposition: A | Discharge status: Home / Self Care | Payer: Medicare Other | Source: Ambulatory Visit | Attending: Internal Medicine | Admitting: Internal Medicine

## 2014-08-03 VITALS — BP 164/78 | HR 61 | Temp 98.0°F | Ht 64.5 in | Wt 132.8 lb

## 2014-08-03 DIAGNOSIS — J45991 Cough variant asthma: Secondary | ICD-10-CM

## 2014-08-03 DIAGNOSIS — I10 Essential (primary) hypertension: Secondary | ICD-10-CM

## 2014-08-03 MED ORDER — VALSARTAN 160 MG PO TABS: 160.0000 mg | ORAL_TABLET | Freq: Every day | ORAL | Status: DC

## 2014-08-03 MED ORDER — AZITHROMYCIN 250 MG PO TABS: ORAL_TABLET | ORAL | Status: DC

## 2014-08-03 MED ORDER — MEPERIDINE HCL 50 MG PO TABS: ORAL_TABLET | ORAL | Status: DC

## 2014-08-03 NOTE — Patient Instructions (Addendum)
Zpak  Change dulera to 100 Take 2 puffs first thing in am and then another 2 puffs about 12 hours later.  Change lisinopril to diovan 160   Only use your albuterol as a rescue medication to be used if you can't catch your breath by resting or doing a relaxed purse lip breathing pattern.  - The less you use it, the better it will work when you need it. - Ok to use up to 2 puffs  every 4 hours if you must but call for immediate appointment if use goes up over your usual need - Don't leave home without it !!  (think of it like the spare tire for your car)   Take delsym two tsp every 12 hours and supplement if needed with demerol  50 mg up to 1  every 4 hours to suppress the urge to cough. Swallowing water or using ice chips/non mint and menthol containing candies (such as lifesavers or sugarless jolly ranchers) are also effective.  You should rest your voice and avoid activities that you know make you cough.  Once you have eliminated the cough for 3 straight days try reducing the demerol first,  then the delsym as tolerated.    Whenever coughing pepcid 20 mg after breakfast and supper   GERD (REFLUX)  is an extremely common cause of respiratory symptoms, many times with no significant heartburn at all.    It can be treated with medication, but also with lifestyle changes including avoidance of late meals, excessive alcohol, smoking cessation, and avoid fatty foods, chocolate, peppermint, colas, red wine, and acidic juices such as orange juice.  NO MINT OR MENTHOL PRODUCTS SO NO COUGH DROPS  USE SUGARLESS CANDY INSTEAD (jolley ranchers or Stover's)  NO OIL BASED VITAMINS - use powdered substitutes.     Please schedule a follow up office visit in 2 weeks, sooner if needed

## 2014-08-03 NOTE — Assessment & Plan Note (Signed)
bp not well controlled on lisinopril and ACE inhibitors are problematic in  pts with airway complaints because  even experienced pulmonologists can't always distinguish ace effects from copd/asthma.  By themselves they don't actually cause a problem, much like oxygen can't by itself start a fire, but they certainly serve as a powerful catalyst or enhancer for any "fire"  or inflammatory process in the upper airway, be it caused by an ET  tube or more commonly reflux (especially in the obese or pts with known GERD or who are on biphoshonates).    In the era of ARB near equivalency esp in short run makes sense to avoid the acei class completely and try diovan 160 mg daily instead  F/u in 2 weeks

## 2014-08-03 NOTE — Assessment & Plan Note (Addendum)
The most common causes of chronic cough in immunocompetent adults include the following: upper airway cough syndrome (UACS), previously referred to as postnasal drip syndrome (PNDS), which is caused by variety of rhinosinus conditions; (2) asthma; (3) GERD; (4) chronic bronchitis from cigarette smoking or other inhaled environmental irritants; (5) nonasthmatic eosinophilic bronchitis; and (6) bronchiectasis.   These conditions, singly or in combination, have accounted for up to 94% of the causes of chronic cough in prospective studies.   Other conditions have constituted no >6% of the causes in prospective studies These have included bronchogenic carcinoma, chronic interstitial pneumonia, sarcoidosis, left ventricular failure, ACEI-induced cough, and aspiration from a condition associated with pharyngeal dysfunction.    Chronic cough is often simultaneously caused by more than one condition. A single cause has been found from 38 to 82% of the time, multiple causes from 18 to 62%. Multiply caused cough has been the result of three diseases up to 42% of the time.       Based on hx and exam, this is most likely:  Cough variant asthma vs  Classic Upper airway cough syndrome, so named because it's frequently impossible to sort out how much is  CR/sinusitis with freq throat clearing (which can be related to primary GERD)   vs  causing  secondary (" extra esophageal")  GERD from wide swings in gastric pressure that occur with throat clearing, often  promoting self use of mint and menthol lozenges that reduce the lower esophageal sphincter tone and exacerbate the problem further in a cyclical fashion.   These are the same pts (now being labeled as having "irritable larynx syndrome" by some cough centers) who not infrequently have a history of having failed to tolerate ace inhibitors,  dry powder inhalers or biphosphonates or report having atypical reflux symptoms that don't respond to standard doses of PPI , and  are easily confused as having aecopd or asthma flares by even experienced allergists/ pulmonologists.   The first step is to try acid suppression and eliminate cyclical coughing and ACEi then regroup in 2 weeks  Will reduce dose of dulera to 100 2bid in meantime    The proper method of use, as well as anticipated side effects, of a metered-dose inhaler are discussed and demonstrated to the patient. Improved effectiveness after extensive coaching during this visit to a level of approximately  75%

## 2014-08-03 NOTE — Progress Notes (Signed)
   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 [name] Consult    Self referral. Pt has seen Dr Halford Chessman in the past for PNA.  She c/o "asthma attacks" x 2 wks- prod cough with yellow to white sputum. She also c/o increased SOB.     coughs to point of gagging, min actual mucus, constant sensation of pnds and throat and chest congestion but no better on saba or dulera with sob with min activity   No obvious other patterns in day to day or daytime variabilty or assoc  cp or chest tightness, subjective wheeze overt sinus or hb symptoms. No unusual exp hx or h/o childhood pna/ asthma or knowledge of premature birth.    Also denies any obvious fluctuation of symptoms with weather or environmental changes or other aggravating or alleviating factors except as outlined above   Current Medications, Allergies, Complete Past Medical History, Past Surgical History, Family History, and Social History were reviewed in [employer] record.            Review of Systems  Constitutional: Negative for fever, chills and unexpected weight change.  HENT: Positive for congestion, postnasal drip, sore throat and trouble swallowing. Negative for dental problem, ear pain, nosebleeds, rhinorrhea, sinus pressure, sneezing and voice change.   Eyes: Negative for visual disturbance.  Respiratory: Positive for cough and shortness of breath. Negative for choking.   Cardiovascular: Negative for chest pain and leg  swelling.  Gastrointestinal: Negative for vomiting, abdominal pain and diarrhea.  Genitourinary: Negative for difficulty urinating.  Musculoskeletal: Negative for arthralgias.  Skin: Negative for rash.  Neurological: Negative for tremors, syncope and headaches.  Hematological: Does not bruise/bleed easily.       Objective:   Physical Exam  Wt Readings from Last 3 Encounters:  08/03/14 132 lb 12.8 oz (60.238 kg)  07/06/14 130 lb (58.968 kg)  02/22/14 130 lb (58.968 kg)      amb wf with mild pseudowheeze  HEENT: nl dentition, turbinates, and orophanx. Nl external ear canals without cough reflex   NECK :  without JVD/Nodes/TM/ nl carotid upstrokes bilaterally   LUNGS: no acc muscle use, clear to A and P bilaterally without cough on insp or exp maneuvers   CV:  RRR  no s3 or murmur or increase in P2, no edema   ABD:  soft and nontender with nl excursion in the supine position. No bruits or organomegaly, bowel sounds nl  MS:  warm without deformities, calf tenderness, cyanosis or clubbing  SKIN: warm and dry without lesions    NEURO:  alert, approp, no deficits     CXR  08/03/2014 :  No acute cardiopulmonary disease.        Assessment & Plan:

## 2014-08-04 NOTE — Progress Notes (Signed)
Quick Note:  Spoke with pt and notified of results per Dr. Wert. Pt verbalized understanding and denied any questions.  ______ 

## 2014-08-16 ENCOUNTER — Ambulatory Visit (INDEPENDENT_AMBULATORY_CARE_PROVIDER_SITE_OTHER): Payer: Medicare Other | Admitting: Internal Medicine

## 2014-08-16 ENCOUNTER — Encounter: Payer: Self-pay | Admitting: Internal Medicine

## 2014-08-16 VITALS — BP 112/70 | HR 70 | Temp 98.6°F | Ht 64.5 in | Wt 135.0 lb

## 2014-08-16 DIAGNOSIS — J45991 Cough variant asthma: Secondary | ICD-10-CM

## 2014-08-16 DIAGNOSIS — I10 Essential (primary) hypertension: Secondary | ICD-10-CM

## 2014-08-16 DIAGNOSIS — Z23 Encounter for immunization: Secondary | ICD-10-CM

## 2014-08-16 MED ORDER — BUDESONIDE-FORMOTEROL FUMARATE 80-4.5 MCG/ACT IN AERO: INHALATION_SPRAY | RESPIRATORY_TRACT | Status: DC

## 2014-08-16 MED ORDER — MOMETASONE FURO-FORMOTEROL FUM 100-5 MCG/ACT IN AERO: 2.0000 | INHALATION_SPRAY | Freq: Two times a day (BID) | RESPIRATORY_TRACT | Status: DC

## 2014-08-16 NOTE — Assessment & Plan Note (Signed)
Still not clear how much is true asthma vs upper airway cough syndrome related to pnds or acei exposure but encouraged she is doing so well only 2 weeks after stopped acei  rec continue low dose symbicort 2bid working toward optimal control of cough/ wheeze/ sob and min saba    Each maintenance medication was reviewed in detail including most importantly the difference between maintenance and as needed and under what circumstances the prns are to be used.  Please see instructions for details which were reviewed in writing and the patient given a copy.

## 2014-08-16 NOTE — Assessment & Plan Note (Signed)
Changed acei to ARB 08/03/2014 > improved 08/16/2014 so continue off acei indefinitely

## 2014-08-16 NOTE — Progress Notes (Signed)
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 Pulmonary Consult    Self referral. Pt has seen Dr Halford Chessman in the past for PNA.  She c/o "asthma attacks" x 2 wks- prod cough with yellow to white sputum. She also c/o increased SOB.    coughs to point of gagging, min actual mucus, constant sensation of pnds and throat and chest congestion but no better on saba or dulera with sob with min activity  rec Zpak  Change dulera to 100 Take 2 puffs first thing in am and then another 2 puffs about 12 hours later.  Change lisinopril to diovan 160  Only use your albuterol as a rescue medication  Take delsym two tsp every 12 hours and supplement if needed with demerol  50 mg up to 1  every 4 hours to suppress the urge to cough.  Whenever coughing pepcid 20 mg after breakfast and supper  GERD diet       08/16/2014 f/u ov/Amaiyah Nordhoff re: extrinsic asthma on dulera 100 2bid / 2 weeks off acei  Chief Complaint  Patient presents with  . Follow-up    Pt states that cough and SOB are much improved, but not back to normal baseline. She is using proair on average 3 x per wk.   really Not limited by breathing from desired activities   Not using any cough suppression any more   No obvious day to day or daytime variabilty or assoc excess or purulent mucus or  cp or chest tightness, subjective wheeze overt  sinus or hb symptoms. No unusual exp hx or h/o childhood pna/ asthma or knowledge of premature birth.  Sleeping ok without nocturnal  or early am exacerbation  of respiratory  c/o's or need for noct saba. Also denies any obvious fluctuation of symptoms with weather or environmental changes or other aggravating or alleviating factors except as outlined above   Current Medications, Allergies, Complete Past Medical History, Past Surgical History, Family History, and Social History were reviewed in [employer] record.  ROS  The following are not active complaints unless bolded sore throat, dysphagia, dental problems, itching, sneezing,  nasal congestion or excess/ purulent secretions, ear ache,   fever, chills, sweats, unintended wt loss, pleuritic or exertional cp, hemoptysis,  orthopnea pnd or leg swelling, presyncope, palpitations, heartburn, abdominal pain, anorexia, nausea, vomiting, diarrhea  or change in bowel or urinary habits, change in stools or urine, dysuria,hematuria,  rash, arthralgias, visual complaints, headache, numbness weakness or ataxia or problems with walking or coordination,  change in mood/affect or memory.                 .       Objective:   Physical Exam  Wt Readings from Last 3 Encounters:  08/16/14  135 lb (61.236 kg)  08/03/14 132 lb 12.8 oz (60.238 kg)  07/06/14 130 lb (58.968 kg)         amb wf with no longer pseudowheeze   HEENT: nl dentition, turbinates, and orophanx. Nl external ear canals without cough reflex   NECK :  without JVD/Nodes/TM/ nl carotid upstrokes bilaterally   LUNGS: no acc muscle use, clear to A and P bilaterally without cough on insp or exp maneuvers   CV:  RRR  no s3 or murmur or increase in P2, no edema   ABD:  soft and nontender with nl excursion in the supine position. No bruits or organomegaly, bowel sounds nl  MS:  warm without deformities, calf tenderness, cyanosis or clubbing        CXR   08/03/2014 :  No acute cardiopulmonary disease.        Assessment & Plan:

## 2014-08-16 NOTE — Patient Instructions (Addendum)
Try symbicort 80 Take 2 puffs first thing in am and then another 2 puffs about 12 hours later.     If you are satisfied with your treatment plan,  let your doctor know and he/she can either refill your medications or you can return here when your prescription runs out.     If in any way you are not 100% satisfied,  please tell us.  If 100% better, tell your friends!  Pulmonary follow up is as needed

## 2014-08-17 ENCOUNTER — Ambulatory Visit: Payer: Medicare Other | Admitting: Internal Medicine

## 2014-08-19 ENCOUNTER — Telehealth: Payer: Self-pay | Admitting: Internal Medicine

## 2014-08-19 ENCOUNTER — Ambulatory Visit: Payer: Medicare Other | Admitting: Family Medicine

## 2014-08-19 ENCOUNTER — Ambulatory Visit (INDEPENDENT_AMBULATORY_CARE_PROVIDER_SITE_OTHER): Payer: Medicare Other | Admitting: Internal Medicine

## 2014-08-19 ENCOUNTER — Encounter: Payer: Self-pay | Admitting: Internal Medicine

## 2014-08-19 VITALS — BP 150/82 | HR 72 | Temp 98.3°F | Resp 12 | Ht 64.0 in | Wt 131.0 lb

## 2014-08-19 DIAGNOSIS — I10 Essential (primary) hypertension: Secondary | ICD-10-CM

## 2014-08-19 DIAGNOSIS — E039 Hypothyroidism, unspecified: Secondary | ICD-10-CM

## 2014-08-19 DIAGNOSIS — M797 Fibromyalgia: Secondary | ICD-10-CM

## 2014-08-19 DIAGNOSIS — J4520 Mild intermittent asthma, uncomplicated: Secondary | ICD-10-CM

## 2014-08-19 DIAGNOSIS — G8929 Other chronic pain: Secondary | ICD-10-CM

## 2014-08-19 DIAGNOSIS — R5382 Chronic fatigue, unspecified: Secondary | ICD-10-CM

## 2014-08-19 DIAGNOSIS — F4323 Adjustment disorder with mixed anxiety and depressed mood: Secondary | ICD-10-CM

## 2014-08-19 DIAGNOSIS — G4701 Insomnia due to medical condition: Secondary | ICD-10-CM

## 2014-08-19 DIAGNOSIS — G9332 Myalgic encephalomyelitis/chronic fatigue syndrome: Secondary | ICD-10-CM

## 2014-08-19 MED ORDER — ESZOPICLONE 3 MG PO TABS: 3.0000 mg | ORAL_TABLET | Freq: Every day | ORAL | Status: DC

## 2014-08-19 MED ORDER — LEVOTHYROXINE SODIUM 125 MCG PO TABS: 125.0000 ug | ORAL_TABLET | Freq: Every day | ORAL | Status: DC

## 2014-08-19 NOTE — Telephone Encounter (Signed)
Patient is requesting refill on lunesta (31m)  [facility]

## 2014-08-19 NOTE — Progress Notes (Signed)
Pre visit review using our clinic review tool, if applicable. No additional management support is needed unless otherwise documented below in the visit note. 

## 2014-08-19 NOTE — Patient Instructions (Signed)
We will switch the thyroid medicine and see you back in about 6-8 weeks to check on the levels. If you have any problems with the medicine please feel free to call our office.

## 2014-08-22 NOTE — Assessment & Plan Note (Signed)
Continue symbicort, montelukast. Albuterol prn although she does not use it frequently.

## 2014-08-22 NOTE — Assessment & Plan Note (Signed)
She will continue with ARB and CCB. BP mildly elevated due to stress of meeting new provider. Well controlled in the past.

## 2014-08-22 NOTE — Assessment & Plan Note (Signed)
She is taking prozac, demerol (prn only), soma (prn only).

## 2014-08-22 NOTE — Assessment & Plan Note (Signed)
She takes her current regimen of medications as on medication list and is doing fairly well at this time. Uses demerol when she gets into a flare but not everyday usage.

## 2014-08-22 NOTE — Assessment & Plan Note (Signed)
Switch from armour thyroid to levothyroxine and return in 6-8 weeks to recheck levels.

## 2014-08-22 NOTE — Assessment & Plan Note (Signed)
Taking prozac and doing fairly well at this time.

## 2014-08-22 NOTE — Assessment & Plan Note (Signed)
Refill her sleep aid as good sleep helps to reduce her pain. Spoke with her about risks of long term therapy and she is willing to accept risk of memory loss, increased risk of falls.

## 2014-08-22 NOTE — Progress Notes (Signed)
   Subjective:    Patient ID: Marissa [name], female    DOB: 18-Dec-1945, 68 y.o.   MRN: [id number]  HPI The patient is a 68 YO female with chronic fatigue, HTN, asthma, hypothyroidism, fibromyalgia. She is dismayed as she has a negative experience with a provider recently and is concerned that she will not be treated well. She is fairly functional with her fatigue and fibromyalgia at this time on her current regimen. She is unable to do much activity without getting pain afterwards. Her mood is stable and good overall. She denies chest pains, SOB, abdominal pain. She does take armour thyroid but is willing to switch to levothyroxine if that would make me more comfortable.   Review of Systems  Constitutional: Positive for fatigue. Negative for fever, activity change and unexpected weight change.  HENT: Negative.   Eyes: Negative.   Respiratory: Negative for cough, chest tightness, shortness of breath and wheezing.   Cardiovascular: Negative for chest pain and leg swelling.  Gastrointestinal: Negative for abdominal pain, diarrhea, constipation and abdominal distention.  Musculoskeletal: Positive for arthralgias and myalgias. Negative for back pain and gait problem.  Skin: Negative.   Neurological: Positive for weakness. Negative for dizziness, light-headedness, numbness and headaches.  Psychiatric/Behavioral: Positive for sleep disturbance. Negative for dysphoric mood and decreased concentration.      Objective:   Physical Exam  Constitutional: She is oriented to person, place, and time. She appears well-developed and well-nourished.  Anxious  HENT:  Head: Normocephalic and atraumatic.  Eyes: EOM are normal.  Neck: Normal range of motion.  Cardiovascular: Normal rate and regular rhythm.   Pulmonary/Chest: Effort normal and breath sounds normal. She has no wheezes.  Abdominal: Soft. Bowel sounds are normal. She exhibits no distension. There is no tenderness. There is no rebound.    Neurological: She is alert and oriented to person, place, and time. Coordination normal.  Skin: Skin is warm and dry.   Filed Vitals:   08/19/14 1423  BP: 150/82  Pulse: 72  Temp: 98.3 F (36.8 C)  TempSrc: Oral  Resp: 12  Height: 5' 4"  (1.626 m)  Weight: 131 lb (59.421 kg)  SpO2: 97%      Assessment & Plan:

## 2014-08-24 NOTE — Telephone Encounter (Signed)
I do not see this medication on her med list. Should we fill it?

## 2014-08-25 ENCOUNTER — Encounter: Payer: Self-pay | Admitting: Internal Medicine

## 2014-08-25 NOTE — Telephone Encounter (Signed)
I handed her a written prescription for this medication (also called eszopiclone) at our visit with 4 refills and it is not due for refill.

## 2014-08-25 NOTE — Telephone Encounter (Signed)
Patient said she does not remember what she did with that prescription. She is calling [facility] to see if she dropped it off. She will call us back.

## 2014-08-30 ENCOUNTER — Other Ambulatory Visit: Payer: Self-pay | Admitting: Geriatric Medicine

## 2014-08-30 ENCOUNTER — Other Ambulatory Visit: Payer: Self-pay | Admitting: Internal Medicine

## 2014-08-30 MED ORDER — FLUOXETINE HCL 40 MG PO CAPS: ORAL_CAPSULE | ORAL | Status: DC

## 2014-08-31 ENCOUNTER — Other Ambulatory Visit: Payer: Self-pay | Admitting: Internal Medicine

## 2014-09-01 ENCOUNTER — Other Ambulatory Visit: Payer: Self-pay | Admitting: Internal Medicine

## 2014-09-01 MED ORDER — FLUOXETINE HCL 40 MG PO CAPS: ORAL_CAPSULE | ORAL | Status: DC

## 2014-09-07 ENCOUNTER — Telehealth: Payer: Self-pay | Admitting: *Deleted

## 2014-09-07 NOTE — Telephone Encounter (Signed)
Left msg on triage stating [employer] said that they have Dr. Doug Sou down as a specialist, and blue med[employer]re is going to canel coverage because she is not a PCP. Was told by office that she is wanting to let Dr. Doug Sou know so maybe this can be taking care of because she want to continue seeing pt...[name]

## 2014-09-08 ENCOUNTER — Encounter: Payer: Self-pay | Admitting: Internal Medicine

## 2014-09-09 ENCOUNTER — Encounter: Payer: Self-pay | Admitting: Internal Medicine

## 2014-09-12 MED ORDER — METRONIDAZOLE 0.75 % EX LOTN: TOPICAL_LOTION | CUTANEOUS | Status: DC

## 2014-09-12 MED ORDER — VALSARTAN 160 MG PO TABS: 160.0000 mg | ORAL_TABLET | Freq: Every day | ORAL | Status: DC

## 2014-09-12 NOTE — Addendum Note (Signed)
Addended by: Vertell Novak A on: 09/12/2014 09:40 AM   Modules accepted: Orders

## 2014-09-20 ENCOUNTER — Encounter: Payer: Self-pay | Admitting: Internal Medicine

## 2014-09-20 ENCOUNTER — Other Ambulatory Visit: Payer: Self-pay | Admitting: Geriatric Medicine

## 2014-09-20 NOTE — Telephone Encounter (Signed)
Please call and schedule a visit for evaluation

## 2014-09-21 ENCOUNTER — Ambulatory Visit (INDEPENDENT_AMBULATORY_CARE_PROVIDER_SITE_OTHER): Payer: Medicare Other | Admitting: Internal Medicine

## 2014-09-21 ENCOUNTER — Encounter: Payer: Self-pay | Admitting: Internal Medicine

## 2014-09-21 VITALS — BP 130/70 | HR 62 | Temp 97.9°F | Wt 135.1 lb

## 2014-09-21 DIAGNOSIS — J0110 Acute frontal sinusitis, unspecified: Secondary | ICD-10-CM

## 2014-09-21 MED ORDER — AMOXICILLIN 500 MG PO CAPS: 500.0000 mg | ORAL_CAPSULE | Freq: Three times a day (TID) | ORAL | Status: DC

## 2014-09-21 NOTE — Progress Notes (Signed)
   Subjective:    Patient ID: Marissa [name], female    DOB: 1946/05/29, 68 y.o.   MRN: [id number]  HPI  2 weeks ago she began to have sinus pressure with pain in the frontal and facial sinuses. This is associated with thick yellow secretions. This has been only partially responsive to pseudoephedrine and Mucinex.  She's had some blood clots in the left nasal passage. She has itchy, watery eyes, and sneezing  Sputum production is scant  She had been treated at the end of September for bronchitis symptoms after exposure to weather changes and wind in [place]. She saw Dr. Melvyn Novas who prescribed a Z-Pak and stopped her lisinopril. The lower respiratory tract symptoms have improved significantly.  She has a past history of asthma. She's never smoked.    Review of Systems Dental pain, sore throat , otic pain or otic discharge denied @ this time. No fever , chills or sweats.     Objective:   Physical Exam  General appearance:good health ;well nourished; no acute distress or increased work of breathing is present.  No  lymphadenopathy about the head, neck, or axilla noted.   Eyes: No conjunctival inflammation or lid edema is present. There is no scleral icterus.  Ears:  External ear exam shows no significant lesions or deformities.  Otoscopic examination reveals clear canals, tympanic membranes are intact bilaterally without bulging, retraction, inflammation or discharge.  Nose:  External nasal examination shows no deformity or inflammation. Nasal mucosa are dry without lesions or exudates. No septal dislocation or deviation.No obstruction to airflow.   Oral exam: Dental hygiene is good; lips and gums are healthy appearing.There is no oropharyngeal erythema or exudate noted.   Neck:  No deformities, thyromegaly, masses, or tenderness noted.   Supple with full range of motion without pain.   Heart:  Normal rate and regular rhythm. S1 and S2 normal without gallop, murmur, click, rub or other  extra sounds.   Lungs:Chest clear to auscultation; no wheezes, rhonchi,rales ,or rubs present.No increased work of breathing. Dry cough  Extremities:  No cyanosis, edema, or clubbing  noted    Skin: Warm & dry w/o jaundice or tenting.        Assessment & Plan:  #1 rhinosinusitis without significant bronchitis  Plan: Nasal hygiene interventions discussed. See prescription medications

## 2014-09-21 NOTE — Patient Instructions (Signed)

## 2014-09-21 NOTE — Progress Notes (Signed)
Pre visit review using our clinic review tool, if applicable. No additional management support is needed unless otherwise documented below in the visit note. 

## 2014-09-26 ENCOUNTER — Other Ambulatory Visit: Payer: Self-pay | Admitting: Geriatric Medicine

## 2014-10-05 ENCOUNTER — Encounter: Payer: Self-pay | Admitting: Internal Medicine

## 2014-10-05 ENCOUNTER — Other Ambulatory Visit (INDEPENDENT_AMBULATORY_CARE_PROVIDER_SITE_OTHER): Payer: Medicare Other

## 2014-10-05 ENCOUNTER — Ambulatory Visit (INDEPENDENT_AMBULATORY_CARE_PROVIDER_SITE_OTHER): Payer: Medicare Other | Admitting: Internal Medicine

## 2014-10-05 VITALS — BP 136/68 | HR 69 | Temp 97.6°F | Resp 14 | Ht 64.0 in | Wt 132.1 lb

## 2014-10-05 DIAGNOSIS — E039 Hypothyroidism, unspecified: Secondary | ICD-10-CM

## 2014-10-05 DIAGNOSIS — K648 Other hemorrhoids: Secondary | ICD-10-CM

## 2014-10-05 DIAGNOSIS — R5383 Other fatigue: Secondary | ICD-10-CM

## 2014-10-05 LAB — VITAMIN D 25 HYDROXY (VIT D DEFICIENCY, FRACTURES): VITD: 38.11 ng/mL (ref 30.00–100.00)

## 2014-10-05 LAB — COMPREHENSIVE METABOLIC PANEL
ALT: 32 U/L (ref 0–35)
AST: 33 U/L (ref 0–37)
Albumin: 4.6 g/dL (ref 3.5–5.2)
Alkaline Phosphatase: 63 U/L (ref 39–117)
BILIRUBIN TOTAL: 0.6 mg/dL (ref 0.2–1.2)
BUN: 16 mg/dL (ref 6–23)
CALCIUM: 9.6 mg/dL (ref 8.4–10.5)
CO2: 25 mEq/L (ref 19–32)
Chloride: 97 mEq/L (ref 96–112)
Creatinine, Ser: 0.7 mg/dL (ref 0.4–1.2)
GFR: 94.15 mL/min (ref >60.00)
GLUCOSE: 84 mg/dL (ref 70–99)
Potassium: 4.4 mEq/L (ref 3.5–5.1)
Sodium: 131 mEq/L — ABNORMAL LOW (ref 135–145)
TOTAL PROTEIN: 6.9 g/dL (ref 6.0–8.3)

## 2014-10-05 LAB — T4, FREE: Free T4: 1.27 ng/dL (ref 0.60–1.60)

## 2014-10-05 LAB — TSH: TSH: 0.18 u[IU]/mL — ABNORMAL LOW (ref 0.35–4.50)

## 2014-10-05 LAB — VITAMIN B12: Vitamin B-12: 1125 pg/mL — ABNORMAL HIGH (ref 211–911)

## 2014-10-05 MED ORDER — CARISOPRODOL 350 MG PO TABS: ORAL_TABLET | ORAL | Status: DC

## 2014-10-05 MED ORDER — HYDROCORTISONE 1 % RE CREA: TOPICAL_CREAM | RECTAL | Status: DC

## 2014-10-05 NOTE — Progress Notes (Signed)
   Subjective:    Patient ID: Marissa [name], female    DOB: [birth year], 68 y.o.   MRN: [id number]  HPI The patient is a 68 year old female comes in today to follow-up on her thyroid. We did switch from Armour Thyroid to levothyroxine at her last visit. We are due for repeat blood work and to see how she's doing clinically. She has had an upper respiratory infection as well as BV since our last visit. This recent bouts of antibiotics but her body out of whack in his turn her into a flare. She did take amoxicillin for recent sinus infection and has gotten a rash. She does remember this as being a side effect she's had previously. It is starting to go away and she has since finished the amoxicillin. She also has noted that her hemorrhoids are active right now  Review of Systems  Constitutional: Positive for fatigue. Negative for fever, activity change and unexpected weight change.  Eyes: Negative.   Respiratory: Positive for stridor. Negative for cough, chest tightness, shortness of breath and wheezing.   Cardiovascular: Negative for chest pain and leg swelling.  Gastrointestinal: Negative for abdominal pain, diarrhea, constipation and abdominal distention.  Musculoskeletal: Positive for myalgias and arthralgias. Negative for back pain and gait problem.  Skin: Positive for rash.  Neurological: Positive for weakness. Negative for dizziness, light-headedness, numbness and headaches.  Psychiatric/Behavioral: Positive for sleep disturbance. Negative for dysphoric mood and decreased concentration.      Objective:   Physical Exam  Constitutional: She is oriented to person, place, and time. She appears well-developed and well-nourished.  HENT:  Head: Normocephalic and atraumatic.  Eyes: EOM are normal.  Neck: Normal range of motion.  Cardiovascular: Normal rate and regular rhythm.   Pulmonary/Chest: Effort normal and breath sounds normal. She has no wheezes.  Abdominal: Soft. Bowel sounds are  normal. She exhibits no distension. There is no tenderness. There is no rebound.  Neurological: She is alert and oriented to person, place, and time. Coordination normal.  Skin: Skin is warm and dry. Rash noted.  Occasional red bumps on arms and legs. No stigmata of scratching.   Filed Vitals:   10/05/14 1051  BP: 136/68  Pulse: 69  Temp: 97.6 F (36.4 C)  TempSrc: Oral  Resp: 14  Height: 5' 4"  (1.626 m)  Weight: 132 lb 1.9 oz (59.929 kg)  SpO2: 97%      Assessment & Plan:

## 2014-10-05 NOTE — Progress Notes (Signed)
Pre visit review using our clinic review tool, if applicable. No additional management support is needed unless otherwise documented below in the visit note. 

## 2014-10-05 NOTE — Patient Instructions (Signed)
We will check on your blood work to make sure that all your vitamin levels are normal. We will also check on your thyroid and will let you know if we need to change the dose on that.  We will also send in a cream that you can use on your hemorrhoids to calm those down.

## 2014-10-06 ENCOUNTER — Encounter: Payer: Self-pay | Admitting: Internal Medicine

## 2014-10-06 DIAGNOSIS — K649 Unspecified hemorrhoids: Secondary | ICD-10-CM | POA: Insufficient documentation

## 2014-10-06 NOTE — Assessment & Plan Note (Signed)
Check free T4 and TSH today. We did change her dosing at last visit and may need to further adjust dosing. She is extra tired at today's visit however given her concurrent recent antibiotic treatments may not be related to her thyroid.

## 2014-10-06 NOTE — Assessment & Plan Note (Signed)
Sent in hydrocortisone rectal cream for her hemorrhoids.

## 2014-10-07 MED ORDER — LEVOTHYROXINE SODIUM 100 MCG PO TABS: 100.0000 ug | ORAL_TABLET | Freq: Every day | ORAL | Status: DC

## 2014-11-14 ENCOUNTER — Other Ambulatory Visit: Payer: Self-pay | Admitting: Geriatric Medicine

## 2014-11-14 MED ORDER — FLUOXETINE HCL 40 MG PO CAPS: ORAL_CAPSULE | ORAL | Status: DC

## 2014-11-16 ENCOUNTER — Encounter: Payer: Self-pay | Admitting: Internal Medicine

## 2014-11-17 NOTE — Telephone Encounter (Signed)
Marissa [name], can we work to get an exception for her soma. Let me know what info you need.

## 2014-11-21 ENCOUNTER — Other Ambulatory Visit: Payer: Self-pay | Admitting: Geriatric Medicine

## 2014-11-21 MED ORDER — FLUOXETINE HCL 40 MG PO CAPS: ORAL_CAPSULE | ORAL | Status: DC

## 2014-11-28 ENCOUNTER — Other Ambulatory Visit: Payer: Self-pay | Admitting: Geriatric Medicine

## 2014-11-28 MED ORDER — MONTELUKAST SODIUM 10 MG PO TABS: ORAL_TABLET | ORAL | Status: DC

## 2014-11-28 MED ORDER — VERAPAMIL HCL ER 240 MG PO TBCR: 240.0000 mg | EXTENDED_RELEASE_TABLET | Freq: Every day | ORAL | Status: DC

## 2014-11-29 ENCOUNTER — Other Ambulatory Visit: Payer: Self-pay | Admitting: Internal Medicine

## 2014-11-29 ENCOUNTER — Encounter: Payer: Self-pay | Admitting: Internal Medicine

## 2014-11-29 MED ORDER — MEPERIDINE HCL 50 MG PO TABS: 50.0000 mg | ORAL_TABLET | Freq: Four times a day (QID) | ORAL | Status: DC | PRN

## 2014-11-30 ENCOUNTER — Other Ambulatory Visit: Payer: Self-pay | Admitting: Geriatric Medicine

## 2014-11-30 MED ORDER — VALSARTAN 160 MG PO TABS: 160.0000 mg | ORAL_TABLET | Freq: Every day | ORAL | Status: DC

## 2014-12-02 ENCOUNTER — Telehealth: Payer: Self-pay | Admitting: Internal Medicine

## 2014-12-02 ENCOUNTER — Other Ambulatory Visit: Payer: Self-pay | Admitting: Internal Medicine

## 2014-12-02 DIAGNOSIS — M8580 Other specified disorders of bone density and structure, unspecified site: Secondary | ICD-10-CM

## 2014-12-02 DIAGNOSIS — Z1211 Encounter for screening for malignant neoplasm of colon: Secondary | ICD-10-CM

## 2014-12-02 NOTE — Telephone Encounter (Signed)
Patient is requesting referral to GI for colonoscopy and she would like to know if she needs to have a bone density again.

## 2014-12-02 NOTE — Telephone Encounter (Signed)
Pt informed

## 2014-12-02 NOTE — Telephone Encounter (Signed)
Have placed order for GI and for bone density as it has been several years.

## 2014-12-02 NOTE — Telephone Encounter (Signed)
Please advise, thanks.

## 2014-12-05 ENCOUNTER — Encounter: Payer: Self-pay | Admitting: Internal Medicine

## 2014-12-05 DIAGNOSIS — M8580 Other specified disorders of bone density and structure, unspecified site: Secondary | ICD-10-CM

## 2014-12-09 ENCOUNTER — Encounter: Payer: Self-pay | Admitting: Internal Medicine

## 2014-12-12 ENCOUNTER — Encounter: Payer: Self-pay | Admitting: Internal Medicine

## 2014-12-13 ENCOUNTER — Encounter: Payer: Self-pay | Admitting: Internal Medicine

## 2014-12-14 ENCOUNTER — Encounter: Payer: Self-pay | Admitting: Internal Medicine

## 2014-12-21 ENCOUNTER — Encounter: Payer: Self-pay | Admitting: Internal Medicine

## 2014-12-22 ENCOUNTER — Encounter: Payer: Self-pay | Admitting: Internal Medicine

## 2014-12-22 ENCOUNTER — Encounter: Payer: Self-pay | Admitting: Gastroenterology

## 2014-12-26 ENCOUNTER — Telehealth: Payer: Self-pay

## 2014-12-26 NOTE — Telephone Encounter (Signed)
Faxed clinical data for lunesta to [facility].

## 2014-12-30 ENCOUNTER — Encounter: Payer: Self-pay | Admitting: Internal Medicine

## 2015-01-02 ENCOUNTER — Other Ambulatory Visit: Payer: Self-pay | Admitting: Geriatric Medicine

## 2015-01-02 MED ORDER — CARISOPRODOL 350 MG PO TABS: ORAL_TABLET | ORAL | Status: DC

## 2015-01-06 ENCOUNTER — Other Ambulatory Visit (INDEPENDENT_AMBULATORY_CARE_PROVIDER_SITE_OTHER): Payer: Medicare Other

## 2015-01-06 ENCOUNTER — Encounter: Payer: Self-pay | Admitting: Internal Medicine

## 2015-01-06 ENCOUNTER — Ambulatory Visit (INDEPENDENT_AMBULATORY_CARE_PROVIDER_SITE_OTHER): Payer: Medicare Other | Admitting: Internal Medicine

## 2015-01-06 VITALS — BP 122/64 | HR 72 | Temp 98.1°F | Resp 14 | Ht 64.0 in | Wt 134.0 lb

## 2015-01-06 DIAGNOSIS — E039 Hypothyroidism, unspecified: Secondary | ICD-10-CM

## 2015-01-06 DIAGNOSIS — Z23 Encounter for immunization: Secondary | ICD-10-CM

## 2015-01-06 DIAGNOSIS — G9332 Myalgic encephalomyelitis/chronic fatigue syndrome: Secondary | ICD-10-CM

## 2015-01-06 DIAGNOSIS — R5382 Chronic fatigue, unspecified: Secondary | ICD-10-CM

## 2015-01-06 LAB — TSH: TSH: 0.84 u[IU]/mL (ref 0.35–4.50)

## 2015-01-06 LAB — T4, FREE: Free T4: 0.88 ng/dL (ref 0.60–1.60)

## 2015-01-06 MED ORDER — MOMETASONE FUROATE 50 MCG/ACT NA SUSP: 2.0000 | Freq: Every day | NASAL | Status: DC

## 2015-01-06 NOTE — Progress Notes (Signed)
   Subjective:    Patient ID: Marissa [name], female    DOB: [birth year], 69 y.o.   MRN: [id number]  HPI The patient is a 69 YO female who is coming in to follow up on her thyroid (on levothyoxine s/p thyroidectomy recent dose change, no complications) and her chronic fatigue syndrome (she has had a relapse since last visit, uses soma, meperidine prn for pain, no complications). She has done well with the dosage change of the thyroid medicine. She thinks that she is feeling a little bit better. She would like that checked today. Denies palpitations, tremors, cold or heat intolerance. Her chronic fatigue was aggravated by caring for her grandchild for about 1 week while her daughter and son-in-law were out of the country. She was unable to use her pain medication since she was still caring for the grandchild but since she is home Monday she has been sleeping 16 hours a day and using small amounts of her soma. She is starting to feel better now but is still somewhat sore.   Review of Systems  Constitutional: Positive for fatigue. Negative for fever, activity change and unexpected weight change.  Eyes: Negative.   Respiratory: Negative for cough, chest tightness, shortness of breath and wheezing.   Cardiovascular: Negative for chest pain and leg swelling.  Gastrointestinal: Negative for abdominal pain, diarrhea, constipation and abdominal distention.  Musculoskeletal: Positive for myalgias and arthralgias. Negative for back pain and gait problem.  Neurological: Positive for weakness. Negative for dizziness, light-headedness, numbness and headaches.  Psychiatric/Behavioral: Positive for sleep disturbance. Negative for dysphoric mood and decreased concentration.      Objective:   Physical Exam  Constitutional: She is oriented to person, place, and time. She appears well-developed and well-nourished.  HENT:  Head: Normocephalic and atraumatic.  Eyes: EOM are normal.  Neck: Normal range of motion.    Cardiovascular: Normal rate and regular rhythm.   Pulmonary/Chest: Effort normal and breath sounds normal. She has no wheezes.  Abdominal: Soft. Bowel sounds are normal. She exhibits no distension. There is no tenderness. There is no rebound.  Neurological: She is alert and oriented to person, place, and time. Coordination normal.  Skin: Skin is warm and dry.   Filed Vitals:   01/06/15 1056  BP: 122/64  Pulse: 72  Temp: 98.1 F (36.7 C)  TempSrc: Oral  Resp: 14  Height: 5' 4"  (1.626 m)  Weight: 134 lb (60.782 kg)  SpO2: 97%      Assessment & Plan:

## 2015-01-06 NOTE — Assessment & Plan Note (Signed)
At last visit we decreased the dosage slightly due to her values being on the edge of the range. Will recheck today and adjust dosing if needed.

## 2015-01-06 NOTE — Assessment & Plan Note (Signed)
Has had a relapse since last visit. She has self medicated with soma and meperidine per her health plan for flares. She is doing better and likely the sleep has helped significantly. She will continue to work with heat and rest to recover fully.

## 2015-01-06 NOTE — Patient Instructions (Signed)
We have given you the pneumonia booster shot today and will check on the labs for the thyroid.   You are doing well overall and we are working on getting the last few things on the health maintenance done.   Come back in about 6 months. If you have problems or questions please call the office sooner.

## 2015-01-06 NOTE — Progress Notes (Signed)
Pre visit review using our clinic review tool, if applicable. No additional management support is needed unless otherwise documented below in the visit note. 

## 2015-01-11 ENCOUNTER — Encounter: Payer: Self-pay | Admitting: Internal Medicine

## 2015-01-11 ENCOUNTER — Other Ambulatory Visit: Payer: Self-pay | Admitting: *Deleted

## 2015-01-11 ENCOUNTER — Other Ambulatory Visit: Payer: Self-pay | Admitting: Internal Medicine

## 2015-01-11 DIAGNOSIS — I712 Thoracic aortic aneurysm, without rupture, unspecified: Secondary | ICD-10-CM

## 2015-01-12 NOTE — Telephone Encounter (Signed)
Faxed to pharmacy

## 2015-01-26 ENCOUNTER — Encounter: Payer: Self-pay | Admitting: Internal Medicine

## 2015-01-26 MED ORDER — LEVOTHYROXINE SODIUM 112 MCG PO TABS: 112.0000 ug | ORAL_TABLET | Freq: Every day | ORAL | Status: DC

## 2015-02-02 ENCOUNTER — Ambulatory Visit (INDEPENDENT_AMBULATORY_CARE_PROVIDER_SITE_OTHER)
Admission: RE | Admit: 2015-02-02 | Discharge: 2015-02-02 | Disposition: A | Discharge status: Home / Self Care | Payer: Medicare Other | Source: Ambulatory Visit | Attending: Internal Medicine | Admitting: Internal Medicine

## 2015-02-02 DIAGNOSIS — M8580 Other specified disorders of bone density and structure, unspecified site: Secondary | ICD-10-CM

## 2015-02-11 LAB — CREATININE, SERUM: CREATININE: 0.6 mg/dL (ref 0.50–1.10)

## 2015-02-11 LAB — BUN: BUN: 12 mg/dL (ref 6–23)

## 2015-02-13 ENCOUNTER — Ambulatory Visit (AMBULATORY_SURGERY_CENTER): Payer: Self-pay | Admitting: *Deleted

## 2015-02-13 VITALS — Ht 64.5 in | Wt 136.0 lb

## 2015-02-13 DIAGNOSIS — Z1211 Encounter for screening for malignant neoplasm of colon: Secondary | ICD-10-CM

## 2015-02-13 MED ORDER — NA SULFATE-K SULFATE-MG SULF 17.5-3.13-1.6 GM/177ML PO SOLN: 1.0000 | Freq: Once | ORAL | Status: DC

## 2015-02-13 NOTE — Progress Notes (Signed)
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 No hx of stroke. Pt wants [place] to be aware of this. Pt states her husband just had a colon and his prep was over 80.00. Sample given, pharmacy called and cancelled suprep script. Documented sample in sample book as per protocol.  ewm

## 2015-02-14 ENCOUNTER — Ambulatory Visit (INDEPENDENT_AMBULATORY_CARE_PROVIDER_SITE_OTHER): Payer: Medicare Other | Admitting: Thoracic Surgery (Cardiothoracic Vascular Surgery)

## 2015-02-14 ENCOUNTER — Encounter: Payer: Self-pay | Admitting: Thoracic Surgery (Cardiothoracic Vascular Surgery)

## 2015-02-14 ENCOUNTER — Ambulatory Visit
Admission: RE | Admit: 2015-02-14 | Discharge: 2015-02-14 | Disposition: A | Discharge status: Home / Self Care | Payer: Medicare Other | Source: Ambulatory Visit | Attending: Thoracic Surgery (Cardiothoracic Vascular Surgery) | Admitting: Thoracic Surgery (Cardiothoracic Vascular Surgery)

## 2015-02-14 VITALS — BP 154/82 | HR 73 | Resp 16 | Ht 64.0 in | Wt 134.0 lb

## 2015-02-14 DIAGNOSIS — I7121 Aneurysm of the ascending aorta, without rupture: Secondary | ICD-10-CM

## 2015-02-14 DIAGNOSIS — I712 Thoracic aortic aneurysm, without rupture, unspecified: Secondary | ICD-10-CM

## 2015-02-14 MED ORDER — GADOBENATE DIMEGLUMINE 529 MG/ML IV SOLN
12.0000 mL | Freq: Once | INTRAVENOUS | Status: AC | PRN
  Administered 2015-02-14: 12 mL via INTRAVENOUS

## 2015-02-14 NOTE — Progress Notes (Signed)
[address]       [place] [address]             [phone]      HPI:  Ms. Geier turns today for scheduled 1 year follow-up visit.  She is a 69 year old woman with history of hypertension who was found to have an ascending aortic aneurysm back in 2012. She's been followed on an annual basis since then. She was last seen a year ago which time the aneurysm was stable. Her blood pressure was elevated on that visit.  She continues to have some vague chest discomfort, which is not always associated with exertion. She says that she has a headache right now after having had the MR done. She says that her blood pressure has been well-controlled when checked since I last saw her.  Past Medical History  Diagnosis Date  . Thyroid disease   . Asthma   . MVP (mitral valve prolapse)   . Migraine   . Dysthymia   . Dysthymia   . Arthritis   . Thoracic aortic aneurysm   . Cataract     mild  . Hyperlipidemia   . Hypertension     managed   . Hemorrhoids   . Hx of ulcerative colitis     per dr Arnoldo Morale as per pt.  . 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 Scoliosis   . Spinal stenosis       Current Outpatient Prescriptions  Medication Sig Dispense Refill  . budesonide-formoterol (SYMBICORT) 80-4.5 MCG/ACT inhaler Take 2 puffs first thing in am and then another 2 puffs about 12 hours later. 1 Inhaler 11  . carisoprodol (SOMA) 350 MG tablet Take one tablet by mouth three times a day as needed. 30 tablet 3  . [facility] CORTISONE MAXIMUM STRENGTH 1 %   12  . cycloSPORINE (RESTASIS) 0.05 % ophthalmic emulsion Place 1 drop into both eyes 2 (two) times daily.    . Eszopiclone 3 MG TABS TAKE 1 TABLET BY MOUTH AT BEDTIME IMMEDIATELY BEFORE BEDTIME 30 tablet 5  . FLUoxetine (PROZAC) 40 MG capsule Take one by mouth twice daily 60 capsule 3  . hydrocortisone  (PROCTOCORT) 1 % CREA Applied twice a day 28.35 g 12  . levothyroxine (SYNTHROID, LEVOTHROID) 112 MCG tablet Take 1 tablet (112 mcg total) by mouth daily. 90 tablet 3  . meperidine (DEMEROL) 50 MG tablet Take 1 tablet (50 mg total) by mouth every 6 (six) hours as needed for severe pain. One every 4 hours if coughing 90 tablet 0  . metroNIDAZOLE (METROGEL) 0.75 % vaginal gel   0  . METRONIDAZOLE, TOPICAL, 0.75 % LOTN Applied to your face twice daily. 118 mL 6  . MINIVELLE 0.05 MG/24HR patch Place 1 patch onto the skin 2 (two) times a week.   3  . mometasone (NASONEX) 50 MCG/ACT nasal spray Place 2 sprays into the nose daily. 17 g 5  . montelukast (SINGULAIR) 10 MG tablet TAKE 1 TABLET (10 MG TOTAL) BY MOUTH AT BEDTIME. 90 tablet 3  . PROAIR HFA 108 (90 BASE) MCG/ACT inhaler INHALE 2 PUFFS INTO THE LUNGS EVERY 6 (SIX) HOURS AS NEEDED. 8.5 each 2  . valsartan (DIOVAN) 160 MG tablet Take 1 tablet (160 mg total) by mouth daily. 90 tablet 3  . verapamil (CALAN-SR)  240 MG CR tablet Take 1 tablet (240 mg total) by mouth at bedtime. 90 tablet 3  . Na Sulfate-K Sulfate-Mg Sulf (SUPREP BOWEL PREP) SOLN Take 1 kit by mouth once. suprep as directed. No substitutions 354 mL 0  . nystatin-triamcinolone (MYCOLOG II) cream   0   No current facility-administered medications for this visit.    Physical Exam BP 154/82 mmHg  Pulse 73  Resp 16  Ht _0  (1.626 m)  Wt 134 lb (60.782 kg)  BMI 22.99 kg/m2  SpO69 90% 69 year old woman in no acute distress Well-developed well-nourished Alert and oriented 3 with no focal neurologic deficits No carotid bruits Cardiac regular rate and rhythm 2/6 systolic murmur, no diastolic murmur Lungs clear with equal breath sounds bilaterally No palpable abdominal mass Peripheral pulses intact, no peripheral edema  Diagnostic Tests: MR angiogram images and report reviewed IMPRESSION: 1. Stable 4.2 cm fusiform aneurysm of the ascending aorta, without complicating  features.   Impression: 69 year old woman who has a known 4.2 cm ascending aortic aneurysm. This is unchanged dating back to 2012.  Once again her blood pressure is elevated today. She says that she thinks is because of the MR, which gave her a headache. She saw Dr. Doug Sou just a month ago and her blood pressure was fine at that visit. I am not going to intervene on her blood pressure today, but did advise her to get it checked again in the near future and make sure that it stays below 225 systolic  Plan: I will see her back in one year with a repeat MR angiogram of the chest  She is aware of the symptoms of dissection and rupture and knows to call 911 immediately if they occur   Melrose Nakayama, MD [facility] [phone]

## 2015-02-28 ENCOUNTER — Ambulatory Visit (AMBULATORY_SURGERY_CENTER): Payer: Medicare Other | Admitting: Gastroenterology

## 2015-02-28 ENCOUNTER — Telehealth: Payer: Self-pay

## 2015-02-28 ENCOUNTER — Encounter: Payer: Self-pay | Admitting: Gastroenterology

## 2015-02-28 VITALS — BP 152/76 | HR 60 | Temp 95.2°F | Resp 25 | Ht 63.0 in | Wt 136.0 lb

## 2015-02-28 DIAGNOSIS — Z1211 Encounter for screening for malignant neoplasm of colon: Secondary | ICD-10-CM | POA: Diagnosis not present

## 2015-02-28 MED ORDER — SODIUM CHLORIDE 0.9 % IV SOLN: 500.0000 mL | INTRAVENOUS | Status: DC

## 2015-02-28 NOTE — Progress Notes (Signed)
Stable to RR 

## 2015-02-28 NOTE — Patient Instructions (Signed)
Discharge instructions given. Normal exam. Resume previous medications. YOU HAD AN ENDOSCOPIC PROCEDURE TODAY AT [facility]:   Refer to the procedure report that was given to you for any specific questions about what was found during the examination.  If the procedure report does not answer your questions, please call your gastroenterologist to clarify.  If you requested that your care partner not be given the details of your procedure findings, then the procedure report has been included in a sealed envelope for you to review at your convenience later.  YOU SHOULD EXPECT: Some feelings of bloating in the abdomen. Passage of more gas than usual.  Walking can help get rid of the air that was put into your GI tract during the procedure and reduce the bloating. If you had a lower endoscopy (such as a colonoscopy or flexible sigmoidoscopy) you may notice spotting of blood in your stool or on the toilet paper. If you underwent a bowel prep for your procedure, you may not have a normal bowel movement for a few days.  Please Note:  You might notice some irritation and congestion in your nose or some drainage.  This is from the oxygen used during your procedure.  There is no need for concern and it should clear up in a day or so.  SYMPTOMS TO REPORT IMMEDIATELY:   Following lower endoscopy (colonoscopy or flexible sigmoidoscopy):  Excessive amounts of blood in the stool  Significant tenderness or worsening of abdominal pains  Swelling of the abdomen that is new, acute  Fever of 100F or higher   For urgent or emergent issues, a gastroenterologist can be reached at any hour by calling [phone].   DIET: Your first meal following the procedure should be a small meal and then it is ok to progress to your normal diet. Heavy or fried foods are harder to digest and may make you feel nauseous or bloated.  Likewise, meals heavy in dairy and vegetables can increase bloating.  Drink plenty  of fluids but you should avoid alcoholic beverages for 24 hours.  ACTIVITY:  You should plan to take it easy for the rest of today and you should NOT DRIVE or use heavy machinery until tomorrow (because of the sedation medicines used during the test).    FOLLOW UP: Our staff will call the number listed on your records the next business day following your procedure to check on you and address any questions or concerns that you may have regarding the information given to you following your procedure. If we do not reach you, we will leave a message.  However, if you are feeling well and you are not experiencing any problems, there is no need to return our call.  We will assume that you have returned to your regular daily activities without incident.  If any biopsies were taken you will be contacted by phone or by letter within the next 1-3 weeks.  Please call us at [phone] if you have not heard about the biopsies in 3 weeks.    SIGNATURES/CONFIDENTIALITY: You and/or your care partner have signed paperwork which will be entered into your electronic medical record.  These signatures attest to the fact that that the information above on your After Visit Summary has been reviewed and is understood.  Full responsibility of the confidentiality of this discharge information lies with you and/or your care-partner.

## 2015-02-28 NOTE — Op Note (Addendum)
[place]  [address]. [place], [address]   COLONOSCOPY PROCEDURE REPORT  PATIENT: Marissa [name], Marissa [name]  MR#: [id number] BIRTHDATE: 05-Nov-1945 , 69  yrs. old GENDER: female ENDOSCOPIST: Inda Castle, MD REFERRED [id number] Doug Sou, M.D. PROCEDURE DATE:  02/28/2015 PROCEDURE:   Colonoscopy, screening First Screening Colonoscopy - Avg.  risk and is 50 yrs.  old or older - No.  Prior Negative Screening - Now for repeat screening. 10 or more years since last screening  History of Adenoma - Now for follow-up colonoscopy & has been > or = to 3 yrs.  N/A ASA CLASS:   Class II INDICATIONS:Colorectal Neoplasm Risk Assessment for this procedure is average risk. MEDICATIONS: Monitored anesthesia care, Propofol 250 mg IV, and Lidocaine 40 mg IV  DESCRIPTION OF PROCEDURE:   After the risks benefits and alternatives of the procedure were thoroughly explained, informed consent was obtained.  The digital rectal exam revealed no abnormalities of the rectum.   The LB [id number]  endoscope was introduced through the anus and advanced to the cecum, which was identified by both the appendix and ileocecal valve. No adverse events experienced.   Limited by poor preparation.   The quality of the prep was (Suprep was used) poor.  The instrument was then slowly withdrawn as the colon was fully examined.      COLON FINDINGS: A normal appearing cecum, ileocecal valve, and appendiceal orifice were identified.  The ascending, transverse, descending, sigmoid colon, and rectum appeared unremarkable. Retroflexed views revealed no abnormalities. The time to cecum = 9.7 Withdrawal time = 6.9   The scope was withdrawn and the procedure completed. COMPLICATIONS: There were no immediate complications.  ENDOSCOPIC IMPRESSION: Normal colonoscopy (limited exam)  RECOMMENDATIONS: [place] in 5 years in view of limitations of exam  eSigned:  Inda Castle, MD 02/28/2015 8:59  AM Revised: 02/28/2015 8:59 AM  cc:

## 2015-02-28 NOTE — Telephone Encounter (Signed)
Call to the patient but had colonoscopy today; Will call back next week to educated on AWV.

## 2015-03-01 ENCOUNTER — Telehealth: Payer: Self-pay

## 2015-03-01 NOTE — Telephone Encounter (Signed)
TN dialed in error.

## 2015-03-06 ENCOUNTER — Encounter: Payer: Self-pay | Admitting: Internal Medicine

## 2015-03-06 ENCOUNTER — Other Ambulatory Visit: Payer: Self-pay | Admitting: Geriatric Medicine

## 2015-03-06 MED ORDER — MEPERIDINE HCL 50 MG PO TABS: 50.0000 mg | ORAL_TABLET | Freq: Four times a day (QID) | ORAL | Status: DC | PRN

## 2015-03-10 ENCOUNTER — Other Ambulatory Visit: Payer: Self-pay | Admitting: Geriatric Medicine

## 2015-03-23 NOTE — Telephone Encounter (Signed)
Patient called to educate on Medicare Wellness apt. LVM for the patient to call back to educate and schedule for wellness visit.   '

## 2015-04-25 ENCOUNTER — Encounter: Payer: Self-pay | Admitting: Internal Medicine

## 2015-04-26 ENCOUNTER — Encounter: Payer: Self-pay | Admitting: Internal Medicine

## 2015-04-26 MED ORDER — BACLOFEN 10 MG PO TABS: 10.0000 mg | ORAL_TABLET | Freq: Three times a day (TID) | ORAL | Status: DC | PRN

## 2015-04-26 NOTE — Telephone Encounter (Signed)
Sent in medicine. Can you get scheduling to call and get her in for acute visit.

## 2015-05-05 ENCOUNTER — Ambulatory Visit (INDEPENDENT_AMBULATORY_CARE_PROVIDER_SITE_OTHER): Payer: Medicare Other | Admitting: Internal Medicine

## 2015-05-05 ENCOUNTER — Other Ambulatory Visit: Payer: Self-pay | Admitting: Emergency Medicine

## 2015-05-05 ENCOUNTER — Encounter: Payer: Self-pay | Admitting: Internal Medicine

## 2015-05-05 VITALS — BP 138/80 | HR 62 | Temp 98.2°F | Resp 16 | Ht 64.5 in | Wt 135.0 lb

## 2015-05-05 DIAGNOSIS — M797 Fibromyalgia: Secondary | ICD-10-CM | POA: Diagnosis not present

## 2015-05-05 MED ORDER — PREDNISONE 20 MG PO TABS: ORAL_TABLET | ORAL | Status: DC

## 2015-05-05 NOTE — Patient Instructions (Signed)
We have sent in the steroids for the inflammation. Days 1-3 take 2 pills daily, days 4-6 take 1 pill daily, days 7-9 take 1/2 pill daily then stop.   We will also have you see the sports medicine doctor named Dr. Charlann Boxer who does a great job and hopefully can help [place] come up with some good ideas.

## 2015-05-05 NOTE — Assessment & Plan Note (Signed)
Is starting to improve with alleve and baclofen. She also has demerol for terrible pain and soma as needed. Will rx prednisone taper for the back pain with sciatica. Have asked her to see sports medicine to see if she has leg length discrepancy (thinks she has been told this before) or other maintenance stretching or interventions to keep her doing well.

## 2015-05-05 NOTE — Progress Notes (Signed)
   Subjective:    Patient ID: Marissa [name], female    DOB: 1946/06/04, 69 y.o.   MRN: [id number]  HPI The patient is a 69 YO female coming in with flare of her chronic fatigue. She had been lifting some furniture that she shouldn't be lifting. That was 2 weeks ago. She has been having muscle spasms and pain so much that she cannot put weight on her hip. She started some alleve and baclofen which has helped about 20%. Still with spasms and working with some myofascial release and very tight all over. It is keeping her from sleeping which is hindering her recovery.   Review of Systems  Constitutional: Positive for fatigue. Negative for fever, activity change and unexpected weight change.  Eyes: Negative.   Respiratory: Negative for cough, chest tightness, shortness of breath and wheezing.   Cardiovascular: Negative for chest pain and leg swelling.  Gastrointestinal: Negative for abdominal pain, diarrhea, constipation and abdominal distention.  Musculoskeletal: Positive for myalgias and arthralgias. Negative for back pain and gait problem.  Neurological: Positive for weakness. Negative for dizziness, light-headedness, numbness and headaches.  Psychiatric/Behavioral: Positive for sleep disturbance. Negative for dysphoric mood and decreased concentration.      Objective:   Physical Exam  Constitutional: She is oriented to person, place, and time. She appears well-developed and well-nourished.  HENT:  Head: Normocephalic and atraumatic.  Eyes: EOM are normal.  Neck: Normal range of motion.  Cardiovascular: Normal rate and regular rhythm.   Pulmonary/Chest: Effort normal and breath sounds normal. She has no wheezes.  Abdominal: Soft. Bowel sounds are normal. She exhibits no distension. There is no tenderness. There is no rebound.  Neurological: She is alert and oriented to person, place, and time. Coordination normal.  Skin: Skin is warm and dry.   Filed Vitals:   05/05/15 0958  BP:  138/80  Pulse: 62  Temp: 98.2 F (36.8 C)  TempSrc: Oral  Resp: 16  Height: 5' 4.5" (1.638 m)  Weight: 135 lb (61.236 kg)  SpO2: 98%      Assessment & Plan:

## 2015-05-05 NOTE — Progress Notes (Signed)
Pre visit review using our clinic review tool, if applicable. No additional management support is needed unless otherwise documented below in the visit note. 

## 2015-06-01 ENCOUNTER — Ambulatory Visit: Payer: Medicare Other | Admitting: Family Medicine

## 2015-06-02 ENCOUNTER — Encounter: Payer: Self-pay | Admitting: Family Medicine

## 2015-06-02 ENCOUNTER — Other Ambulatory Visit (INDEPENDENT_AMBULATORY_CARE_PROVIDER_SITE_OTHER): Payer: Medicare Other

## 2015-06-02 ENCOUNTER — Ambulatory Visit (INDEPENDENT_AMBULATORY_CARE_PROVIDER_SITE_OTHER): Payer: Medicare Other | Admitting: Family Medicine

## 2015-06-02 ENCOUNTER — Ambulatory Visit (INDEPENDENT_AMBULATORY_CARE_PROVIDER_SITE_OTHER)
Admission: RE | Admit: 2015-06-02 | Discharge: 2015-06-02 | Disposition: A | Discharge status: Home / Self Care | Payer: Medicare Other | Source: Ambulatory Visit | Attending: Family Medicine | Admitting: Family Medicine

## 2015-06-02 ENCOUNTER — Telehealth: Payer: Self-pay | Admitting: Internal Medicine

## 2015-06-02 VITALS — BP 122/76 | HR 68 | Wt 137.0 lb

## 2015-06-02 DIAGNOSIS — M25552 Pain in left hip: Secondary | ICD-10-CM

## 2015-06-02 DIAGNOSIS — M7062 Trochanteric bursitis, left hip: Secondary | ICD-10-CM | POA: Diagnosis not present

## 2015-06-02 DIAGNOSIS — M1612 Unilateral primary osteoarthritis, left hip: Secondary | ICD-10-CM | POA: Insufficient documentation

## 2015-06-02 DIAGNOSIS — M1990 Unspecified osteoarthritis, unspecified site: Secondary | ICD-10-CM | POA: Diagnosis not present

## 2015-06-02 NOTE — Telephone Encounter (Signed)
MD out office will hold until she return...Marissa [name]

## 2015-06-02 NOTE — Progress Notes (Signed)
Pre visit review using our clinic review tool, if applicable. No additional management support is needed unless otherwise documented below in the visit note. 

## 2015-06-02 NOTE — Progress Notes (Signed)
[facility] [address] [place], [place] [address] Phone: [phone] Subjective:    I'm seeing this patient by the request  of:  Olga Millers, MD   CC: Hip pain  BSW:[id number] Marissa [name] is a 69 y.o. female coming in with complaint of left hip pain. Patient states that this is been going on for quite some time but has worsening significantly worse. Patient states it seems to be mostly on the lateral aspect of the hip but can radiate down the lateral and anterior aspects of the thigh all the way down to her foot. Patient states that there is groin pain that is associated with it. Patient states it is difficult to tell how often it occurs but states that now the pain is chronic. States that when she rolls onto her left side she can wake up at night. Patient has not tried any home modalities at this time. Rates the severity of pain is 8 out of 10. Denies it giving out on her.     Past Medical History  Diagnosis Date  . Thyroid disease   . Asthma   . MVP (mitral valve prolapse)   . Migraine   . Dysthymia   . Dysthymia   . Arthritis   . Thoracic aortic aneurysm   . Cataract     mild  . Hyperlipidemia   . Hypertension     managed   . Hemorrhoids   . Hx of ulcerative colitis     per dr Arnoldo Morale as per pt.  . 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 Scoliosis   . Spinal stenosis    Past Surgical History  Procedure Laterality Date  . Abdominal hysterectomy    . Tonsillectomy and adenoidectomy    . Sinus irrigation    . Hand surgery      left thumb joint resection  . Foot surgery      3 pins in toes   . Nasal revision    . Bunionectomy    . Colonoscopy  02-04-2005    all normal    History  Substance Use Topics  . Smoking status: Never Smoker   . Smokeless tobacco: Never Used  . Alcohol Use: Yes     Comment:  occ   Allergies  Allergen Reactions  . Fentanyl Shortness Of Breath and Rash  . Amoxicillin Diarrhea    Severe diarrhea, rash to vaginal area with swelling   . Codeine Other (See Comments)    makes her hyper  . Conjugated Estrogens Itching and Rash  . Erythromycin Rash    had a rash with emycin, has done ok with other meds in it's class  . Piroxicam Itching and Rash    Feldene   Family History  Problem Relation Age of Onset  . Arthritis Mother   . Leukemia Father   . Colon cancer Neg Hx      Past medical history, social, surgical and family history all reviewed in electronic medical record.   Review of Systems: No headache, visual changes, nausea, vomiting, diarrhea, constipation, dizziness, abdominal pain, skin rash, fevers, chills, night sweats, weight loss, swollen lymph nodes, body aches, joint swelling, muscle aches, chest pain, shortness of breath, mood changes.   Objective Blood pressure 122/76, pulse 68, weight 137 lb (62.143 kg), SpO2 97 %.  General: No apparent distress alert and oriented x3 mood and affect normal, dressed appropriately.  HEENT: Pupils equal, extraocular movements intact  Respiratory: Patient's speak in full sentences and does not appear short of breath  Cardiovascular: No lower extremity edema, non tender, no erythema  Skin: Warm dry intact with no signs of infection or rash on extremities or on axial skeleton.  Abdomen: Soft nontender  Neuro: Cranial nerves II through XII are intact, neurovascularly intact in all extremities with 2+ DTRs and 2+ pulses.  Lymph: No lymphadenopathy of posterior or anterior cervical chain or axillae bilaterally.  Gait normal with good balance and coordination.  MSK:  Non tender with full range of motion and good stability and symmetric strength and tone of shoulders, elbows, wrist,  knee and ankles bilaterally.  Hip: Left ROM IR: 20 Deg, ER: 35 Deg, Flexion: 120 Deg, Extension: 100 Deg, Abduction: 45 Deg, Adduction:  25 Deg Strength IR: 5/5, ER: 5/5, Flexion: 5/5, Extension: 5/5, Abduction: 5/5, Adduction: 5/5 Pelvic alignment unremarkable to inspection and palpation. Standing hip rotation and gait without trendelenburg sign / unsteadiness. Tender over the greater trochanteric area mild tenderness over the piriformis Positive Corky Sox and FADIR Minorly positive straight leg test on left side No SI joint tenderness and normal minimal SI movement. Contralateral hip unremarkable   Procedure: Real-time Ultrasound Guided Injection of left greater trochanteric bursitis secondary to patient's body habitus Device: GE Logiq E  Ultrasound guided injection is preferred based studies that show increased duration, increased effect, greater accuracy, decreased procedural pain, increased response rate, and decreased cost with ultrasound guided versus blind injection.  Verbal informed consent obtained.  Time-out conducted.  Noted no overlying erythema, induration, or other signs of local infection.  Skin prepped in a sterile fashion.  Local anesthesia: Topical Ethyl chloride.  With sterile technique and under real time ultrasound guidance:  Greater trochanteric area was visualized and patient's bursa was noted. A 22-gauge 3 inch needle was inserted and 4 cc of 0.5% Marcaine and 1 cc of Kenalog 40 mg/dL was injected. Pictures taken Completed without difficulty  Pain immediately resolved suggesting accurate placement of the medication.  Advised to call if fevers/chills, erythema, induration, drainage, or persistent bleeding.  Images permanently stored and available for review in the ultrasound unit.  Impression: Technically successful ultrasound guided injection.   Impression and Recommendations:     This case required medical decision making of moderate complexity.

## 2015-06-02 NOTE — Patient Instructions (Addendum)
Good to see you.  Ice 20 minutes 2 times daily. Usually after activity and before bed. Exercises 3 times a week.  pennsaid pinkie amount topically 2 times daily as needed.  Xrays downstairs today on your hip and back Continue to be active, limit impact activities when you can Take tylenol 650 mg three times a day is the best evidence based medicine we have for arthritis.  Glucosamine sulfate 1569m twice daily Vitamin D 2000 IU daily Fish oil 2 grams daily.  Tumeric 506mtwice daily.  Capsaicin topically up to four times a day may also help with pain. It's important that you continue to stay active. Shoe inserts with good arch support may be helpful.  [employer] orthotics at [employer] could help.  Water aerobics and cycling with low resistance are the best two types of exercise for arthritis. Come back and see me in 3-4 weeks.   Standing:  Secure a rubber exercise band/tubing so that it is at the height of your shoulders when you are either standing or sitting on a firm arm-less chair.  Grasp an end of the band/tubing in each hand and have your palms face each other. Straighten your elbows and lift your hands straight in front of you at shoulder height. Step back away from the secured end of band/tubing until it becomes tense.  Squeeze your shoulder blades together. Keeping your elbows locked and your hands at shoulder-height, bring your hands out to your side.  Hold __________ seconds. Slowly ease the tension on the band/tubing as you reverse the directions and return to the starting position. Repeat __________ times. Complete this exercise __________ times per day. STRENGTH - Scapular Retractors  Secure a rubber exercise band/tubing so that it is at the height of your shoulders when you are either standing or sitting on a firm arm-less chair.  With a palm-down grip, grasp an end of the band/tubing in each hand. Straighten your elbows and lift your hands straight in front of you at  shoulder height. Step back away from the secured end of band/tubing until it becomes tense.  Squeezing your shoulder blades together, draw your elbows back as you bend them. Keep your upper arm lifted away from your body throughout the exercise.  Hold __________ seconds. Slowly ease the tension on the band/tubing as you reverse the directions and return to the starting position. Repeat __________ times. Complete this exercise __________ times per day. STRENGTH - Shoulder Extensors   Secure a rubber exercise band/tubing so that it is at the height of your shoulders when you are either standing or sitting on a firm arm-less chair.  With a thumbs-up grip, grasp an end of the band/tubing in each hand. Straighten your elbows and lift your hands straight in front of you at shoulder height. Step back away from the secured end of band/tubing until it becomes tense.  Squeezing your shoulder blades together, pull your hands down to the sides of your thighs. Do not allow your hands to go behind you.  Hold for __________ seconds. Slowly ease the tension on the band/tubing as you reverse the directions and return to the starting position. Repeat __________ times. Complete this exercise __________ times per day.  STRENGTH - Scapular Retractors and External Rotators  Secure a rubber exercise band/tubing so that it is at the height of your shoulders when you are either standing or sitting on a firm arm-less chair.  With a palm-down grip, grasp an end of the band/tubing in each hand.  Bend your elbows 90 degrees and lift your elbows to shoulder height at your sides. Step back away from the secured end of band/tubing until it becomes tense.  Squeezing your shoulder blades together, rotate your shoulder so that your upper arm and elbow remain stationary, but your fists travel upward to head-height.  Hold __________ for seconds. Slowly ease the tension on the band/tubing as you reverse the directions and return  to the starting position. Repeat __________ times. Complete this exercise __________ times per day.  STRENGTH - Scapular Retractors and External Rotators, Rowing  Secure a rubber exercise band/tubing so that it is at the height of your shoulders when you are either standing or sitting on a firm arm-less chair.  With a palm-down grip, grasp an end of the band/tubing in each hand. Straighten your elbows and lift your hands straight in front of you at shoulder height. Step back away from the secured end of band/tubing until it becomes tense.  Step 1: Squeeze your shoulder blades together. Bending your elbows, draw your hands to your chest as if you are rowing a boat. At the end of this motion, your hands and elbow should be at shoulder-height and your elbows should be out to your sides.  Step 2: Rotate your shoulder to raise your hands above your head. Your forearms should be vertical and your upper-arms should be horizontal.  Hold for __________ seconds. Slowly ease the tension on the band/tubing as you reverse the directions and return to the starting position. Repeat __________ times. Complete this exercise __________ times per day.  STRENGTH - Scapular Retractors and Elevators  Secure a rubber exercise band/tubing so that it is at the height of your shoulders when you are either standing or sitting on a firm arm-less chair.  With a thumbs-up grip, grasp an end of the band/tubing in each hand. Step back away from the secured end of band/tubing until it becomes tense.  Squeezing your shoulder blades together, straighten your elbows and lift your hands straight over your head.  Hold for __________ seconds. Slowly ease the tension on the band/tubing as you reverse the directions and return to the starting position. Repeat __________ times. Complete this exercise __________ times per day.  Document Released: 10/14/2005 Document Revised: 01/06/2012 Document Reviewed: 01/26/2009 [facility] Patient  Information 2015 [place], [place]. This information is not intended to replace advice given to you by your health care provider. Make sure you discuss any questions you have with your health care provider.

## 2015-06-02 NOTE — Telephone Encounter (Signed)
Patient is requesting a med change from baclofen 10 mg because to medication makes her sick back to Soma 350 mg.  Please advise.

## 2015-06-02 NOTE — Assessment & Plan Note (Signed)
Patient was given an injection in this is likely going to help somewhat. We discussed icing regimen and home exercises. We discussed over-the-counter natural supplementations. We discussed what activities to do and patient work with athletic trainer to learn home exercises in greater detail. I do believe that underlying osteophytic changes of the hip is also contributing to most of her discomfort. We will get x-rays today to further evaluate. With patient having a positive straight leg test we will also get back x-rays to make sure no lumbar radiculopathy could also be contributing. Patient has any worsening symptoms she will call me sooner. With patient's past medical history and comorbidities this can complicate the diagnosis. Patient come back and see me as we discussed in 3-4 weeks.

## 2015-06-05 ENCOUNTER — Encounter: Payer: Self-pay | Admitting: Internal Medicine

## 2015-06-06 ENCOUNTER — Telehealth: Payer: Self-pay | Admitting: Internal Medicine

## 2015-06-06 ENCOUNTER — Other Ambulatory Visit: Payer: Self-pay | Admitting: Geriatric Medicine

## 2015-06-06 MED ORDER — MONTELUKAST SODIUM 10 MG PO TABS: ORAL_TABLET | ORAL | Status: DC

## 2015-06-06 NOTE — Telephone Encounter (Signed)
Patient sent an email to Dr. Doug Sou regarding needing a prescription for montelukast (SINGULAIR) 10 MG tablet [[id number]. She has been out for a week.  Pharmacy is [facility]

## 2015-06-06 NOTE — Telephone Encounter (Signed)
Sent to pharmacy 

## 2015-06-15 MED ORDER — CARISOPRODOL 350 MG PO TABS: ORAL_TABLET | ORAL | Status: DC

## 2015-06-15 NOTE — Telephone Encounter (Signed)
Printed, please fax.

## 2015-06-15 NOTE — Telephone Encounter (Signed)
Notified pt md ok rx [facility],.../lmb

## 2015-06-16 ENCOUNTER — Telehealth: Payer: Self-pay

## 2015-06-16 ENCOUNTER — Ambulatory Visit: Payer: Medicare Other | Admitting: Family Medicine

## 2015-06-16 NOTE — Telephone Encounter (Signed)
The patient has apt with dr. Doug Sou for fup; call to see if she would like to come in early and complete AWV or schedule at a later time. LVM for return call

## 2015-06-19 NOTE — Telephone Encounter (Signed)
Call rec'd from the patient who agreed to come in at 9:30 prior to her 10:15 apt with dr. Doug Sou tomorrow the 23rd

## 2015-06-20 ENCOUNTER — Encounter: Payer: Self-pay | Admitting: Internal Medicine

## 2015-06-20 ENCOUNTER — Ambulatory Visit (INDEPENDENT_AMBULATORY_CARE_PROVIDER_SITE_OTHER): Payer: Medicare Other | Admitting: Internal Medicine

## 2015-06-20 VITALS — BP 138/70 | HR 67 | Temp 98.4°F | Resp 14 | Ht 65.4 in | Wt 134.0 lb

## 2015-06-20 DIAGNOSIS — F4323 Adjustment disorder with mixed anxiety and depressed mood: Secondary | ICD-10-CM | POA: Diagnosis not present

## 2015-06-20 DIAGNOSIS — R32 Unspecified urinary incontinence: Secondary | ICD-10-CM | POA: Insufficient documentation

## 2015-06-20 DIAGNOSIS — N3946 Mixed incontinence: Secondary | ICD-10-CM | POA: Diagnosis not present

## 2015-06-20 DIAGNOSIS — Z0000 Encounter for general adult medical examination without abnormal findings: Secondary | ICD-10-CM | POA: Diagnosis not present

## 2015-06-20 DIAGNOSIS — M797 Fibromyalgia: Secondary | ICD-10-CM | POA: Diagnosis not present

## 2015-06-20 MED ORDER — MIRABEGRON ER 25 MG PO TB24: 25.0000 mg | ORAL_TABLET | Freq: Every day | ORAL | Status: DC

## 2015-06-20 MED ORDER — MEPERIDINE HCL 50 MG PO TABS: 50.0000 mg | ORAL_TABLET | Freq: Four times a day (QID) | ORAL | Status: DC | PRN

## 2015-06-20 NOTE — Progress Notes (Signed)
Medical screening examination/treatment/procedure(s) were performed by non-physician practitioner and as supervising physician I was immediately available for consultation/collaboration. I agree with above. Olga Millers, MD

## 2015-06-20 NOTE — Progress Notes (Signed)
Pre visit review using our clinic review tool, if applicable. No additional management support is needed unless otherwise documented below in the visit note. 

## 2015-06-20 NOTE — Patient Instructions (Addendum)
We have given you the prescription for the demerol. We have also sent in a medicine called mybetriq for the bladder. It does not have many side effects and can take 2-4 weeks to kick in. If you start noticing some help but not enough we can increase to the full strength dose. If it is not helping at all we can try the vesicare back again (that has more side effects like dry mouth and constipation).    Marissa [name] , Thank you for taking time to come for your Medicare Wellness Visit. I appreciate your ongoing commitment to your health goals. Please review the following plan we discussed and let me know if I can assist you in the future.   Will seek hearing test  Will have mammogram this hear per Dr. Matthew Saras Will discuss Ua Incontinence and inc'ing depression with Dr. Doug Sou  These are the goals we discussed: Goals    None      This is a list of the screening recommended for you and due dates:  Health Maintenance  Topic Date Due  .  Hepatitis C: One time screening is recommended by Center for Disease Control  (CDC) for  adults born from [birth year] through 1966.   [birth year]  . Flu Shot  05/29/2015  . Mammogram  11/28/2016  . Tetanus Vaccine  07/28/2021  . Colon Cancer Screening  02/27/2025  . DEXA scan (bone density measurement)  Completed  . Shingles Vaccine  Completed  . Pneumonia vaccines  Completed     Health Maintenance Adopting a healthy lifestyle and getting preventive care can go a long way to promote health and wellness. Talk with your health care provider about what schedule of regular examinations is right for you. This is a good chance for you to check in with your provider about disease prevention and staying healthy. In between checkups, there are plenty of things you can do on your own. Experts have done a lot of research about which lifestyle changes and preventive measures are most likely to keep you healthy. Ask your health care provider for more information. WEIGHT AND DIET   Eat a healthy diet  Be sure to include plenty of vegetables, fruits, low-fat dairy products, and lean protein.  Do not eat a lot of foods high in solid fats, added sugars, or salt.  Get regular exercise. This is one of the most important things you can do for your health.  Most adults should exercise for at least 150 minutes each week. The exercise should increase your heart rate and make you sweat (moderate-intensity exercise).  Most adults should also do strengthening exercises at least twice a week. This is in addition to the moderate-intensity exercise.  Maintain a healthy weight  Body mass index (BMI) is a measurement that can be used to identify possible weight problems. It estimates body fat based on height and weight. Your health care provider can help determine your BMI and help you achieve or maintain a healthy weight.  For females 43 years of age and older:   A BMI below 18.5 is considered underweight.  A BMI of 18.5 to 24.9 is normal.  A BMI of 25 to 29.9 is considered overweight.  A BMI of 30 and above is considered obese.  Watch levels of cholesterol and blood lipids  You should start having your blood tested for lipids and cholesterol at 70 years of age, then have this test every 5 years.  You may need to have your cholesterol  levels checked more often if:  Your lipid or cholesterol levels are high.  You are older than 70 years of age.  You are at high risk for heart disease.  CANCER SCREENING   Lung Cancer  Lung cancer screening is recommended for adults 83-43 years old who are at high risk for lung cancer because of a history of smoking.  A yearly low-dose CT scan of the lungs is recommended for people who:  Currently smoke.  Have quit within the past 15 years.  Have at least a 30-pack-year history of smoking. A pack year is smoking an average of one pack of cigarettes a day for 1 year.  Yearly screening should continue until it has been 15  years since you quit.  Yearly screening should stop if you develop a health problem that would prevent you from having lung cancer treatment.  Breast Cancer  Practice breast self-awareness. This means understanding how your breasts normally appear and feel.  It also means doing regular breast self-exams. Let your health care provider know about any changes, no matter how small.  If you are in your 20s or 30s, you should have a clinical breast exam (CBE) by a health care provider every 1-3 years as part of a regular health exam.  If you are 40 or older, have a CBE every year. Also consider having a breast X-ray (mammogram) every year.  If you have a family history of breast cancer, talk to your health care provider about genetic screening.  If you are at high risk for breast cancer, talk to your health care provider about having an MRI and a mammogram every year.  Breast cancer gene (BRCA) assessment is recommended for women who have family members with BRCA-related cancers. BRCA-related cancers include:  Breast.  Ovarian.  Tubal.  Peritoneal cancers.  Results of the assessment will determine the need for genetic counseling and BRCA1 and BRCA2 testing. Cervical Cancer Routine pelvic examinations to screen for cervical cancer are no longer recommended for nonpregnant women who are considered low risk for cancer of the pelvic organs (ovaries, uterus, and vagina) and who do not have symptoms. A pelvic examination may be necessary if you have symptoms including those associated with pelvic infections. Ask your health care provider if a screening pelvic exam is right for you.   The Pap test is the screening test for cervical cancer for women who are considered at risk.  If you had a hysterectomy for a problem that was not cancer or a condition that could lead to cancer, then you no longer need Pap tests.  If you are older than 65 years, and you have had normal Pap tests for the past 10  years, you no longer need to have Pap tests.  If you have had past treatment for cervical cancer or a condition that could lead to cancer, you need Pap tests and screening for cancer for at least 20 years after your treatment.  If you no longer get a Pap test, assess your risk factors if they change (such as having a new sexual partner). This can affect whether you should start being screened again.  Some women have medical problems that increase their chance of getting cervical cancer. If this is the case for you, your health care provider may recommend more frequent screening and Pap tests.  The human papillomavirus (HPV) test is another test that may be used for cervical cancer screening. The HPV test looks for the virus that can  cause cell changes in the cervix. The cells collected during the Pap test can be tested for HPV.  The HPV test can be used to screen women 61 years of age and older. Getting tested for HPV can extend the interval between normal Pap tests from three to five years.  An HPV test also should be used to screen women of any age who have unclear Pap test results.  After 70 years of age, women should have HPV testing as often as Pap tests.  Colorectal Cancer  This type of cancer can be detected and often prevented.  Routine colorectal cancer screening usually begins at 70 years of age and continues through 70 years of age.  Your health care provider may recommend screening at an earlier age if you have risk factors for colon cancer.  Your health care provider may also recommend using home test kits to check for hidden blood in the stool.  A small camera at the end of a tube can be used to examine your colon directly (sigmoidoscopy or colonoscopy). This is done to check for the earliest forms of colorectal cancer.  Routine screening usually begins at age 68.  Direct examination of the colon should be repeated every 5-10 years through 70 years of age. However, you may  need to be screened more often if early forms of precancerous polyps or small growths are found. Skin Cancer  Check your skin from head to toe regularly.  Tell your health care provider about any new moles or changes in moles, especially if there is a change in a mole's shape or color.  Also tell your health care provider if you have a mole that is larger than the size of a pencil eraser.  Always use sunscreen. Apply sunscreen liberally and repeatedly throughout the day.  Protect yourself by wearing long sleeves, pants, a wide-brimmed hat, and sunglasses whenever you are outside. HEART DISEASE, DIABETES, AND HIGH BLOOD PRESSURE   Have your blood pressure checked at least every 1-2 years. High blood pressure causes heart disease and increases the risk of stroke.  If you are between 60 years and 26 years old, ask your health care provider if you should take aspirin to prevent strokes.  Have regular diabetes screenings. This involves taking a blood sample to check your fasting blood sugar level.  If you are at a normal weight and have a low risk for diabetes, have this test once every three years after 70 years of age.  If you are overweight and have a high risk for diabetes, consider being tested at a younger age or more often. PREVENTING INFECTION  Hepatitis B  If you have a higher risk for hepatitis B, you should be screened for this virus. You are considered at high risk for hepatitis B if:  You were born in a country where hepatitis B is common. Ask your health care provider which countries are considered high risk.  Your parents were born in a high-risk country, and you have not been immunized against hepatitis B (hepatitis B vaccine).  You have HIV or AIDS.  You use needles to inject street drugs.  You live with someone who has hepatitis B.  You have had sex with someone who has hepatitis B.  You get hemodialysis treatment.  You take certain medicines for conditions,  including cancer, organ transplantation, and autoimmune conditions. Hepatitis C  Blood testing is recommended for:  Everyone born from [birth year] through 1966.  Anyone with known risk factors  for hepatitis C. Sexually transmitted infections (STIs)  You should be screened for sexually transmitted infections (STIs) including gonorrhea and chlamydia if:  You are sexually active and are younger than 70 years of age.  You are older than 70 years of age and your health care provider tells you that you are at risk for this type of infection.  Your sexual activity has changed since you were last screened and you are at an increased risk for chlamydia or gonorrhea. Ask your health care provider if you are at risk.  If you do not have HIV, but are at risk, it may be recommended that you take a prescription medicine daily to prevent HIV infection. This is called pre-exposure prophylaxis (PrEP). You are considered at risk if:  You are sexually active and do not regularly use condoms or know the HIV status of your partner(s).  You take drugs by injection.  You are sexually active with a partner who has HIV. Talk with your health care provider about whether you are at high risk of being infected with HIV. If you choose to begin PrEP, you should first be tested for HIV. You should then be tested every 3 months for as long as you are taking PrEP.  PREGNANCY   If you are premenopausal and you may become pregnant, ask your health care provider about preconception counseling.  If you may become pregnant, take 400 to 800 micrograms (mcg) of folic acid every day.  If you want to prevent pregnancy, talk to your health care provider about birth control (contraception). OSTEOPOROSIS AND MENOPAUSE   Osteoporosis is a disease in which the bones lose minerals and strength with aging. This can result in serious bone fractures. Your risk for osteoporosis can be identified using a bone density scan.  If you are 43  years of age or older, or if you are at risk for osteoporosis and fractures, ask your health care provider if you should be screened.  Ask your health care provider whether you should take a calcium or vitamin D supplement to lower your risk for osteoporosis.  Menopause may have certain physical symptoms and risks.  Hormone replacement therapy may reduce some of these symptoms and risks. Talk to your health care provider about whether hormone replacement therapy is right for you.  HOME CARE INSTRUCTIONS   Schedule regular health, dental, and eye exams.  Stay current with your immunizations.   Do not use any tobacco products including cigarettes, chewing tobacco, or electronic cigarettes.  If you are pregnant, do not drink alcohol.  If you are breastfeeding, limit how much and how often you drink alcohol.  Limit alcohol intake to no more than 1 drink per day for nonpregnant women. One drink equals 12 ounces of beer, 5 ounces of wine, or 1 ounces of hard liquor.  Do not use street drugs.  Do not share needles.  Ask your health care provider for help if you need support or information about quitting drugs.  Tell your health care provider if you often feel depressed.  Tell your health care provider if you have ever been abused or do not feel safe at home. Document Released: 04/29/2011 Document Revised: 02/28/2014 Document Reviewed: 09/15/2013 [facility] Patient Information 2015 [place], [place]. This information is not intended to replace advice given to you by your health care provider. Make sure you discuss any questions you have with your health care provider.

## 2015-06-20 NOTE — Assessment & Plan Note (Signed)
Does not sound to be overflow incontinence. Will try myrbetriq 25 mg daily for 1 month. If helping some will increase to 50 mg daily. If no improvement can try prn oxybutynin or vesicare. No signs or symptoms of infection at this time.

## 2015-06-20 NOTE — Progress Notes (Signed)
   Subjective:    Patient ID: Marissa [name], female    DOB: 1946-07-01, 69 y.o.   MRN: [id number]  HPI The patient is a 69 YO female who is coming in for urinary incontinence. She had tried vesicare some years ago which gave her severe dry mouth but did help. She has massive flow of urine that leaks without notice. She has seen urologist in the past who did not think she would benefit from any kind of surgery. She has tried Kegels in the past which did not help much. She has not tried anything else.   Review of Systems  Constitutional: Positive for fatigue. Negative for fever, activity change and unexpected weight change.  Eyes: Negative.   Respiratory: Negative for cough, chest tightness, shortness of breath and wheezing.   Cardiovascular: Negative for chest pain and leg swelling.  Gastrointestinal: Negative for abdominal pain, diarrhea, constipation and abdominal distention.  Genitourinary: Positive for enuresis.       Incontinence  Musculoskeletal: Positive for myalgias and arthralgias. Negative for back pain and gait problem.       Improving  Neurological: Positive for weakness. Negative for dizziness, light-headedness, numbness and headaches.  Psychiatric/Behavioral: Positive for sleep disturbance. Negative for dysphoric mood and decreased concentration.      Objective:   Physical Exam  Constitutional: She is oriented to person, place, and time. She appears well-developed and well-nourished.  HENT:  Head: Normocephalic and atraumatic.  Eyes: EOM are normal.  Neck: Normal range of motion.  Cardiovascular: Normal rate and regular rhythm.   Pulmonary/Chest: Effort normal and breath sounds normal. She has no wheezes.  Abdominal: Soft. Bowel sounds are normal. She exhibits no distension. There is no tenderness. There is no rebound.  Neurological: She is alert and oriented to person, place, and time. Coordination normal.  Skin: Skin is warm and dry.   Filed Vitals:   06/20/15 0946  06/20/15 1023  BP: 140/70 138/70  Pulse: 67   Temp: 98.4 F (36.9 C)   TempSrc: Oral   Resp: 14   Height: 5' 5.4" (1.661 m)   Weight: 134 lb (60.782 kg)   SpO2: 99%       Assessment & Plan:

## 2015-06-20 NOTE — Assessment & Plan Note (Signed)
Finally out of flare, refill of demerol provided today. She is not able to take baclofen due to side effects and we have sent back in soma for her muscles which helps better and less side effects for her.

## 2015-06-20 NOTE — Assessment & Plan Note (Signed)
Stable at this time off medication. She does still struggle with mood problems when she gets into a flare.

## 2015-06-20 NOTE — Progress Notes (Addendum)
Subjective:   Marissa [name] is a 69 y.o. female who presents for an Initial Medicare Annual Wellness Visit.  Review of Systems    HRA assessment completed during visit;  Patient is here for Annual Wellness Assessment prior to fup with Dr. Doug Sou Problem list reviewed and are being managed medically;   Will educate for lifestyle changes as appropriate for HTN; Asthma; fibro;    Problem list review for risk  Lipids chol 251; trig 129; HDL 86 and LDL 145 checked in 2014/ ratio is 3 BMI: 22.9 last check July 16  Diet; BMI normal  Exercise; nothing aerobic; takes the laundry upstairs; faithful to do yoga stretches and doing lower back stretches per Dr. Tamala Julian Now doing 3000 steps; but does take dogs out;  Getting ready to move furniture; Understands energy conservation; ice packs and stopping to take time to breath and rest Graduated exercise for chronic fatigue and fibro; just learning that this is not helping per Study in [place] Do what you feel you can do each day and don't push past your limit. Was doing weights; [employer] at [place] but did assist her initially. Motivation is 9yo grand child    Family Hx  Arthritis Mother Provider  Leukemia Father Provider   Social history:  Psychosocial support; safe community; firearm safety; smoke alarms;  Caregiver to other? no  Safety discussed; lives in 2 level home but lives downstairs;  Statistician is upstairs and for now, does feel like it is a challenge; They have reviewed the home for changes for the future to make it more accessible for future needs;     Discussed Goal to improve health based on risk; Will also work to try and consider hobbies she can do while in the bed resting.   Screenings overdue Hepatitis C / given information  ____________________ Ophthalmology exam; Once a year; Dr Wendy Poet; to be scheduled later this year Immunizations; completed pneumonia and TDAP due 07/2021; Shingles completed  Jan 2014 Colonoscopy; 02/28/2015/ normal;  EKG not completed Dexa scan; 02/02/2015 normal Mammogram Jan 2014 / will have repeat this year; Dr. Delanna Ahmadi office will call her to schedule Hearing: no deficits noted Dental: Just had crowns and very diligent  Gave information on safety to take home;   Current Care Team reviewed and updated  Cardiac Risk Factors include: advanced age (>71mn, >>43women);hypertension;sedentary lifestyle     Objective:    Today's Vitals   06/20/15 0946 06/20/15 1023  BP: 140/70 138/70  Pulse: 67   Temp: 98.4 F (36.9 C)   TempSrc: Oral   Resp: 14   Height: 5' 5.4" (1.661 m)   Weight: 134 lb (60.782 kg)   SpO2: 99%     Current Medications (verified) Outpatient Encounter Prescriptions as of 06/20/2015  Medication Sig  . budesonide-formoterol (SYMBICORT) 80-4.5 MCG/ACT inhaler Take 2 puffs first thing in am and then another 2 puffs about 12 hours later.  . carisoprodol (SOMA) 350 MG tablet Take one tablet by mouth three times a day as needed.  . [facility] CORTISONE MAXIMUM STRENGTH 1 %   . cycloSPORINE (RESTASIS) 0.05 % ophthalmic emulsion Place 1 drop into both eyes 2 (two) times daily.  . Eszopiclone 3 MG TABS TAKE 1 TABLET BY MOUTH AT BEDTIME IMMEDIATELY BEFORE BEDTIME  . FLUoxetine (PROZAC) 40 MG capsule Take one by mouth twice daily  . levothyroxine (SYNTHROID, LEVOTHROID) 112 MCG tablet Take 1 tablet (112 mcg total) by mouth daily.  . meperidine (DEMEROL)  50 MG tablet Take 1 tablet (50 mg total) by mouth every 6 (six) hours as needed for severe pain. One every 4 hours if coughing  . METRONIDAZOLE, TOPICAL, 0.75 % LOTN Applied to your face twice daily.  [name] Kitchen MINIVELLE 0.05 MG/24HR patch Place 1 patch onto the skin 2 (two) times a week.   . mometasone (NASONEX) 50 MCG/ACT nasal spray Place 2 sprays into the nose daily.  . montelukast (SINGULAIR) 10 MG tablet TAKE 1 TABLET (10 MG TOTAL) BY MOUTH AT BEDTIME.  [name] Kitchen nystatin-triamcinolone (MYCOLOG II) cream     . PROAIR HFA 108 (90 BASE) MCG/ACT inhaler INHALE 2 PUFFS INTO THE LUNGS EVERY 6 (SIX) HOURS AS NEEDED.  [name] Kitchen valsartan (DIOVAN) 160 MG tablet Take 1 tablet (160 mg total) by mouth daily.  . verapamil (CALAN-SR) 240 MG CR tablet Take 1 tablet (240 mg total) by mouth at bedtime.  . [DISCONTINUED] meperidine (DEMEROL) 50 MG tablet Take 1 tablet (50 mg total) by mouth every 6 (six) hours as needed for severe pain. One every 4 hours if coughing  . hydrocortisone (PROCTOCORT) 1 % CREA Applied twice a day (Patient not taking: Reported on 06/20/2015)  . metroNIDAZOLE (METROGEL) 0.75 % vaginal gel   . mirabegron ER (MYRBETRIQ) 25 MG TB24 tablet Take 1 tablet (25 mg total) by mouth daily.  . [DISCONTINUED] predniSONE (DELTASONE) 20 MG tablet Day 1-3 take 2 pills daily, days 4-6 take 1 pill daily, days 7-9 take 1/2 pill daily. (Patient not taking: Reported on 06/20/2015)   No facility-administered encounter medications on file as of 06/20/2015.    Allergies (verified) Fentanyl; Amoxicillin; Codeine; Conjugated estrogens; Erythromycin; and Piroxicam   History: Past Medical History  Diagnosis Date  . Thyroid disease   . Asthma   . MVP (mitral valve prolapse)   . Migraine   . Dysthymia   . Dysthymia   . Arthritis   . Thoracic aortic aneurysm   . Cataract     mild  . Hyperlipidemia   . Hypertension     managed   . Hemorrhoids   . Hx of ulcerative colitis     per dr Arnoldo Morale as per pt.  . 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 Scoliosis   . Spinal stenosis    Past Surgical History  Procedure Laterality Date  . Abdominal hysterectomy    . Tonsillectomy and adenoidectomy    . Sinus irrigation    . Hand surgery      left thumb joint resection  . Foot surgery      3 pins in toes   . Nasal revision    . Bunionectomy    . Colonoscopy  02-04-2005    all normal    Family  History  Problem Relation Age of Onset  . Arthritis Mother   . Leukemia Father   . Colon cancer Neg Hx    Social History   Occupational History  . Not on file.   Social History Main Topics  . Smoking status: Never Smoker   . Smokeless tobacco: Never Used  . Alcohol Use: 0.0 oz/week    0 Standard drinks or equivalent per week     Comment: occ 1 or 2 glasses; scotch  . Drug Use: No  . Sexual Activity: No    Tobacco Counseling Counseling given: Not Answered   Activities  of Daily Living In your present state of health, do you have any difficulty performing the following activities: 06/20/2015  Hearing? N  Vision? N  Walking or climbing stairs? Y  Dressing or bathing? N  Doing errands, shopping? N  Preparing Food and eating ? Y  Using the Toilet? N  In the past six months, have you accidently leaked urine? Y  Do you have problems with loss of bowel control? N  Managing your Medications? N  Managing your Finances? N  Housekeeping or managing your Housekeeping? Y    Immunizations and Health Maintenance Immunization History  Administered Date(s) Administered  . Influenza Split 09/12/2011, 08/19/2012  . Influenza Whole 08/20/2007, 07/29/2008, 08/07/2009, 07/25/2010  . Influenza,inj,Quad PF,36+ Mos 07/19/2013, 08/16/2014  . Pneumococcal Conjugate-13 01/06/2015  . Pneumococcal Polysaccharide-23 07/19/2013  . Tdap 07/29/2011  . Zoster 10/28/2012   Health Maintenance Due  Topic Date Due  . Hepatitis C Screening  Apr 12, 1946  . INFLUENZA VACCINE  05/29/2015    Patient Care Team: Olga Millers, MD as PCP - General (Internal Medicine)  Indicate any recent Medical Services you may have received from other than [facility] providers in the past year (date may be approximate).     Assessment:   This is a routine wellness examination for Pricilla Holm.   Objective:   The goal of the wellness visit is to assist the patient how to close the gaps in care and create a preventative  care plan for the patient. Personalized Education was given regarding exercise and conservative  Exercise as tolerated   Assessment included: Bone density scan as appropriate/  Is normal per the patient Taking meds without issues; no barriers identified  Labs were and fup visit noted with MD if labs are due to be re-drawn.  Stress and pain management reviewed   No Risk for hepatitis or high risk social behavior identified via hepatitis screen  Safety issues reviewed for fall risk due to medications  Cognition assessed by AD8; Score 0 MMSE deferred as the patient stated they had no memory issues; No identified risk were noted; The patient was oriented x 3; appropriate in dress and manner and no objective failures at ADL's or IADL's.   Depression screen was positive to discuss with MD at today's visit; basically due to needing more rest lately.  Functional issues wax and wane from day to day and manages based on how she is feeling per day. Bowel and bladder issues assessed/ Does states she has episodes of incont. Will discuss with the doctor as she has not talked about this.    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 No    Cognitive Function: MMSE - Mini Mental State Exam 06/20/2015  Not completed: Unable to complete    Screening Tests Health Maintenance  Topic Date Due  . Hepatitis C Screening  15-Oct-1946  . INFLUENZA  VACCINE  05/29/2015  .  MAMMOGRAM  11/28/2016  . TETANUS/TDAP  07/28/2021  . COLONOSCOPY  02/27/2025  . DEXA SCAN  Completed  . ZOSTAVAX  Completed  . PNA vac Low Risk Adult  Completed      Plan:   Will have hearing checked; Will review issues of urinary incont as well as some increase in depression due to immobility and having to sleep more;   During the course of the visit, Pricilla Holm was educated and counseled about the following appropriate screening and preventive services:   Vaccines to include Pneumoccal, Influenza, Hepatitis B, Td, Zostavax, HCV/ reviewed  Electrocardiogram/ not noted  Cardiovascular disease screening/ per MD / 2014 Lipid ratio 3   Colorectal cancer screening; May 2016 normal  Bone density screening/ 01/2015; no issues  Diabetes screening/ deferred  Glaucoma screening/ Once a year with Dr. Wendy Poet / tbs this year  Mammography/PAP per dr. Delanna Ahmadi office and they will call her when due  Nutrition counseling/ deferred;   Smoking cessation counseling/ does not smoke  Does drink some scotch at times;   Patient Instructions (the written plan) were given to the patient.    Wynetta Fines, RN   06/20/2015

## 2015-07-07 ENCOUNTER — Ambulatory Visit (INDEPENDENT_AMBULATORY_CARE_PROVIDER_SITE_OTHER): Payer: Medicare Other | Admitting: Family Medicine

## 2015-07-07 ENCOUNTER — Encounter: Payer: Self-pay | Admitting: Family Medicine

## 2015-07-07 VITALS — BP 112/80 | HR 63 | Wt 136.0 lb

## 2015-07-07 DIAGNOSIS — M5416 Radiculopathy, lumbar region: Secondary | ICD-10-CM | POA: Diagnosis not present

## 2015-07-07 MED ORDER — GABAPENTIN 100 MG PO CAPS: 200.0000 mg | ORAL_CAPSULE | Freq: Every day | ORAL | Status: DC

## 2015-07-07 NOTE — Progress Notes (Signed)
[name] [address] [place], [place] [address] Phone: [phone] Subjective:    I'm seeing this patient by the request  of:  Olga Millers, MD   CC: Hip pain follow up  ZDG:[id number] Marissa [name] is a 69 y.o. female coming in with complaint of left hip pain. Patient was seen previously and was given an injection of the greater trochanteric area. Patient states that it took some time but it did improve. Patient states backing off some of the activities seem to help 2. States that most the pain seems to be more the back and has the radicular symptoms going down the lateral aspect the leg after a lot of activity. Seems to be worse after 5 PM. States that it feels that her leg can be heavy. States that when she goes to bed it can be difficult to follow sleep when she is asleep he does relatively well.'s in the morning she stills seems to feel better and then once again throughout the day it seems to get worse.     Past Medical History  Diagnosis Date  . Thyroid disease   . Asthma   . MVP (mitral valve prolapse)   . Migraine   . Dysthymia   . Dysthymia   . Arthritis   . Thoracic aortic aneurysm   . Cataract     mild  . Hyperlipidemia   . Hypertension     managed   . Hemorrhoids   . Hx of ulcerative colitis     per dr Arnoldo Morale as per pt.  . 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 Scoliosis   . Spinal stenosis    Past Surgical History  Procedure Laterality Date  . Abdominal hysterectomy    . Tonsillectomy and adenoidectomy    . Sinus irrigation    . Hand surgery      left thumb joint resection  . Foot surgery      3 pins in toes   . Nasal revision    . Bunionectomy    . Colonoscopy  02-04-2005    all normal    Social History  Substance Use Topics  . Smoking status: Never Smoker   . Smokeless tobacco: Never  Used  . Alcohol Use: 0.0 oz/week    0 Standard drinks or equivalent per week     Comment: occ 1 or 2 glasses; scotch   Allergies  Allergen Reactions  . Fentanyl Shortness Of Breath and Rash  . Amoxicillin Diarrhea    Severe diarrhea, rash to vaginal area with swelling   . Codeine Other (See Comments)    makes her hyper  . Conjugated Estrogens Itching and Rash  . Erythromycin Rash    had a rash with emycin, has done ok with other meds in it's class  . Piroxicam Itching and Rash    Feldene   Family History  Problem Relation Age of Onset  . Arthritis Mother   . Leukemia Father   . Colon cancer Neg Hx      Past medical history, social, surgical and family history all reviewed in electronic medical record.   Review of Systems: No headache, visual changes, nausea, vomiting, diarrhea, constipation, dizziness, abdominal pain, skin rash, fevers, chills, night sweats, weight loss, swollen lymph nodes, body aches, joint swelling, muscle aches,  chest pain, shortness of breath, mood changes.   Objective There were no vitals taken for this visit.  General: No apparent distress alert and oriented x3 mood and affect normal, dressed appropriately.  HEENT: Pupils equal, extraocular movements intact  Respiratory: Patient's speak in full sentences and does not appear short of breath  Cardiovascular: No lower extremity edema, non tender, no erythema  Skin: Warm dry intact with no signs of infection or rash on extremities or on axial skeleton.  Abdomen: Soft nontender  Neuro: Cranial nerves II through XII are intact, neurovascularly intact in all extremities with 2+ DTRs and 2+ pulses.  Lymph: No lymphadenopathy of posterior or anterior cervical chain or axillae bilaterally.  Gait normal with good balance and coordination.  MSK:  Non tender with full range of motion and good stability and symmetric strength and tone of shoulders, elbows, wrist,  knee and ankles bilaterally.  Hip: Left ROM IR:  20 Deg, ER: 35 Deg, Flexion: 120 Deg, Extension: 100 Deg, Abduction: 45 Deg, Adduction: 25 Deg Strength IR: 5/5, ER: 5/5, Flexion: 5/5, Extension: 5/5, Abduction: 5/5, Adduction: 5/5 Pelvic alignment unremarkable to inspection and palpation. Standing hip rotation and gait without trendelenburg sign / unsteadiness. Positive Corky Sox and FADIR  positive straight leg test on left side No SI joint tenderness and normal minimal SI movement. Contralateral hip unremarkable     Impression and Recommendations:     This case required medical decision making of moderate complexity.

## 2015-07-07 NOTE — Patient Instructions (Signed)
Good to see you Gabapentin 17m at night will help with many things. Try nightly for 1 week then 2 pills nightly thereafter.  Ice is your friend.  Continue to stay active and try to continue to get back to your old routine slowly increasing 10% a weeks.  Iron 669melemental iron 3 times a week.  Consider DHEA 5043maily for 4 weeks.  See me again in 6 weeks.

## 2015-07-07 NOTE — Assessment & Plan Note (Signed)
Patient does have signs that are consistent with more of a lumbar radiculopathy. We discussed icing, home exercises, we discussed gabapentin. Patient will start on a slow low dose and titrate as needed.  Patient does not have any weakness at this time. We will see patient response to this conservative therapy. Patient come back again in 6 weeks for further evaluation and treatment. If continuing to have trouble advance imaging may be warranted.

## 2015-07-10 ENCOUNTER — Other Ambulatory Visit: Payer: Self-pay | Admitting: Internal Medicine

## 2015-07-10 ENCOUNTER — Telehealth: Payer: Self-pay | Admitting: Internal Medicine

## 2015-07-10 ENCOUNTER — Encounter: Payer: Self-pay | Admitting: Internal Medicine

## 2015-07-10 DIAGNOSIS — Z0000 Encounter for general adult medical examination without abnormal findings: Secondary | ICD-10-CM

## 2015-07-10 NOTE — Telephone Encounter (Signed)
Order has been placed and she can stop there anytime.

## 2015-07-10 NOTE — Telephone Encounter (Signed)
Patient is requesting Hep C screening.  States she spoke to Dr. Doug Sou about it on her last visit.  Does patient have to make OV appointment for this or can she just go to lab?

## 2015-07-10 NOTE — Telephone Encounter (Signed)
Patient aware.

## 2015-07-13 ENCOUNTER — Other Ambulatory Visit: Payer: Medicare Other

## 2015-07-13 ENCOUNTER — Ambulatory Visit (INDEPENDENT_AMBULATORY_CARE_PROVIDER_SITE_OTHER): Payer: Medicare Other

## 2015-07-13 DIAGNOSIS — Z23 Encounter for immunization: Secondary | ICD-10-CM | POA: Diagnosis not present

## 2015-07-13 DIAGNOSIS — Z0000 Encounter for general adult medical examination without abnormal findings: Secondary | ICD-10-CM

## 2015-07-14 LAB — HEPATITIS C ANTIBODY: HCV Ab: NEGATIVE

## 2015-07-31 ENCOUNTER — Encounter: Payer: Self-pay | Admitting: Internal Medicine

## 2015-08-09 ENCOUNTER — Encounter: Payer: Self-pay | Admitting: Internal Medicine

## 2015-08-09 ENCOUNTER — Other Ambulatory Visit: Payer: Self-pay | Admitting: Internal Medicine

## 2015-08-10 NOTE — Telephone Encounter (Signed)
[facility]

## 2015-08-18 ENCOUNTER — Ambulatory Visit (INDEPENDENT_AMBULATORY_CARE_PROVIDER_SITE_OTHER): Payer: Medicare Other | Admitting: Internal Medicine

## 2015-08-18 ENCOUNTER — Encounter: Payer: Self-pay | Admitting: Internal Medicine

## 2015-08-18 VITALS — BP 140/80 | HR 73 | Temp 98.4°F | Resp 14 | Ht 64.5 in | Wt 134.0 lb

## 2015-08-18 DIAGNOSIS — M5416 Radiculopathy, lumbar region: Secondary | ICD-10-CM

## 2015-08-18 DIAGNOSIS — L719 Rosacea, unspecified: Secondary | ICD-10-CM | POA: Diagnosis not present

## 2015-08-18 MED ORDER — NAPROXEN 500 MG PO TABS: 500.0000 mg | ORAL_TABLET | Freq: Two times a day (BID) | ORAL | Status: DC

## 2015-08-18 MED ORDER — DOXYCYCLINE HYCLATE 100 MG PO TABS: 100.0000 mg | ORAL_TABLET | Freq: Two times a day (BID) | ORAL | Status: DC

## 2015-08-18 NOTE — Patient Instructions (Signed)
We are checking the MRI of the back to see if there are any problems with the nerves in the low back causing the hip pain.   We have sent in doxycycline for the face. Take 1 pill twice a day with food for the next 2 week. Call us back and let us know if you are better. If not we will continue for another 2 weeks.   We have sent in the prescription strength of the naproxen which you can take twice a day with food.

## 2015-08-18 NOTE — Progress Notes (Signed)
Pre visit review using our clinic review tool, if applicable. No additional management support is needed unless otherwise documented below in the visit note. 

## 2015-08-18 NOTE — Progress Notes (Signed)
   Subjective:    Patient ID: Marissa [name], female    DOB: 1946-07-29, 69 y.o.   MRN: [id number]  HPI The patient is a 69 YO female coming in for her rosacea. She has been using metronidazole lotion twice daily for some months now. No real improvement and she rates as moderate to severe and keeps her from leaving the house as often. No fevers or chills. Does not often use makeup unless she needs to socialize.  Still having hip pain that seems to come from her back. Seen sports medicine before and they were going to get further imaging if no improvement. Trying to do the exercises she was given but no improvement. She is icing and using heat without much relief.   Review of Systems  Constitutional: Positive for fatigue. Negative for fever, activity change and unexpected weight change.  Eyes: Negative.   Respiratory: Negative for cough, chest tightness, shortness of breath and wheezing.   Cardiovascular: Negative for chest pain and leg swelling.  Gastrointestinal: Negative for abdominal pain, diarrhea, constipation and abdominal distention.  Musculoskeletal: Positive for myalgias and arthralgias. Negative for back pain and gait problem.  Skin: Positive for rash.  Neurological: Positive for weakness. Negative for dizziness, light-headedness, numbness and headaches.  Psychiatric/Behavioral: Positive for sleep disturbance. Negative for dysphoric mood and decreased concentration.      Objective:   Physical Exam  Constitutional: She is oriented to person, place, and time. She appears well-developed and well-nourished.  HENT:  Head: Normocephalic and atraumatic.  Eyes: EOM are normal.  Neck: Normal range of motion.  Cardiovascular: Normal rate and regular rhythm.   Pulmonary/Chest: Effort normal and breath sounds normal. She has no wheezes.  Abdominal: Soft. Bowel sounds are normal. She exhibits no distension. There is no tenderness. There is no rebound.  Neurological: She is alert and  oriented to person, place, and time. Coordination normal.  Skin: Skin is warm and dry.  Several spots on her face   Filed Vitals:   08/18/15 0947  BP: 140/80  Pulse: 73  Temp: 98.4 F (36.9 C)  TempSrc: Oral  Resp: 14  Height: 5' 4.5" (1.638 m)  Weight: 134 lb (60.782 kg)  SpO2: 98%      Assessment & Plan:

## 2015-08-18 NOTE — Assessment & Plan Note (Signed)
Prior x-ray with degeneration and plan to pursue more imaging if no improvement. Will order MRI lumbar spine for cause of her pain and treat as indicated.

## 2015-08-18 NOTE — Assessment & Plan Note (Signed)
Rx for doxycycline and she will stop the metronidazole lotion. She will also see dermatology when she is able to get in with them.

## 2015-08-31 ENCOUNTER — Encounter: Payer: Self-pay | Admitting: Internal Medicine

## 2015-08-31 MED ORDER — OXYBUTYNIN CHLORIDE 5 MG PO TABS: 5.0000 mg | ORAL_TABLET | Freq: Two times a day (BID) | ORAL | Status: DC

## 2015-09-04 ENCOUNTER — Other Ambulatory Visit: Payer: Self-pay | Admitting: Internal Medicine

## 2015-09-07 ENCOUNTER — Other Ambulatory Visit: Payer: Self-pay | Admitting: Internal Medicine

## 2015-09-07 MED ORDER — MEPERIDINE HCL 50 MG PO TABS: 50.0000 mg | ORAL_TABLET | Freq: Four times a day (QID) | ORAL | Status: DC | PRN

## 2015-09-07 NOTE — Addendum Note (Signed)
Addended by: Pricilla Holm A on: 09/07/2015 11:50 AM   Modules accepted: Orders

## 2015-09-10 ENCOUNTER — Ambulatory Visit
Admission: RE | Admit: 2015-09-10 | Discharge: 2015-09-10 | Disposition: A | Discharge status: Home / Self Care | Payer: Medicare Other | Source: Ambulatory Visit | Attending: Internal Medicine | Admitting: Internal Medicine

## 2015-09-10 DIAGNOSIS — M5416 Radiculopathy, lumbar region: Secondary | ICD-10-CM

## 2015-10-04 ENCOUNTER — Other Ambulatory Visit: Payer: Self-pay | Admitting: Internal Medicine

## 2015-10-05 ENCOUNTER — Other Ambulatory Visit: Payer: Self-pay | Admitting: Internal Medicine

## 2015-10-05 ENCOUNTER — Other Ambulatory Visit: Payer: Self-pay | Admitting: Family Medicine

## 2015-10-06 NOTE — Telephone Encounter (Signed)
Refill done.  

## 2015-10-23 ENCOUNTER — Other Ambulatory Visit: Payer: Self-pay | Admitting: Internal Medicine

## 2015-10-31 ENCOUNTER — Other Ambulatory Visit: Payer: Self-pay | Admitting: Internal Medicine

## 2015-11-01 ENCOUNTER — Other Ambulatory Visit: Payer: Self-pay | Admitting: Internal Medicine

## 2015-11-07 ENCOUNTER — Encounter: Payer: Self-pay | Admitting: Internal Medicine

## 2015-11-10 ENCOUNTER — Telehealth: Payer: Self-pay

## 2015-11-10 NOTE — Telephone Encounter (Signed)
Insurance company sent over form for completion to approve medication myrbetriq. Form completed signed faxed and sent to scan.

## 2015-11-28 ENCOUNTER — Other Ambulatory Visit: Payer: Self-pay | Admitting: Internal Medicine

## 2015-11-30 DIAGNOSIS — Z01419 Encounter for gynecological examination (general) (routine) without abnormal findings: Secondary | ICD-10-CM | POA: Diagnosis not present

## 2015-11-30 DIAGNOSIS — Z1231 Encounter for screening mammogram for malignant neoplasm of breast: Secondary | ICD-10-CM | POA: Diagnosis not present

## 2015-11-30 DIAGNOSIS — Z124 Encounter for screening for malignant neoplasm of cervix: Secondary | ICD-10-CM | POA: Diagnosis not present

## 2015-11-30 DIAGNOSIS — Z6822 Body mass index (BMI) 22.0-22.9, adult: Secondary | ICD-10-CM | POA: Diagnosis not present

## 2015-12-25 ENCOUNTER — Encounter: Payer: Self-pay | Admitting: Internal Medicine

## 2015-12-25 ENCOUNTER — Other Ambulatory Visit: Payer: Self-pay | Admitting: Internal Medicine

## 2015-12-25 MED ORDER — MEPERIDINE HCL 50 MG PO TABS: 50.0000 mg | ORAL_TABLET | Freq: Four times a day (QID) | ORAL | Status: DC | PRN

## 2015-12-28 ENCOUNTER — Other Ambulatory Visit: Payer: Self-pay | Admitting: Internal Medicine

## 2015-12-28 NOTE — Telephone Encounter (Signed)
Please advise, thanks.

## 2016-01-04 ENCOUNTER — Encounter: Payer: Self-pay | Admitting: Internal Medicine

## 2016-01-04 ENCOUNTER — Ambulatory Visit (INDEPENDENT_AMBULATORY_CARE_PROVIDER_SITE_OTHER): Payer: Medicare Other | Admitting: Internal Medicine

## 2016-01-04 ENCOUNTER — Other Ambulatory Visit (INDEPENDENT_AMBULATORY_CARE_PROVIDER_SITE_OTHER): Payer: Medicare Other

## 2016-01-04 VITALS — BP 168/90 | HR 64 | Temp 98.4°F | Resp 16 | Ht 64.5 in | Wt 139.0 lb

## 2016-01-04 DIAGNOSIS — R5383 Other fatigue: Secondary | ICD-10-CM

## 2016-01-04 DIAGNOSIS — M797 Fibromyalgia: Secondary | ICD-10-CM

## 2016-01-04 LAB — COMPREHENSIVE METABOLIC PANEL
ALT: 17 U/L (ref 0–35)
AST: 20 U/L (ref 0–37)
Albumin: 4.4 g/dL (ref 3.5–5.2)
Alkaline Phosphatase: 54 U/L (ref 39–117)
BUN: 15 mg/dL (ref 6–23)
CALCIUM: 9.4 mg/dL (ref 8.4–10.5)
CO2: 27 mEq/L (ref 19–32)
Chloride: 98 mEq/L (ref 96–112)
Creatinine, Ser: 0.89 mg/dL (ref 0.40–1.20)
GFR: 66.44 mL/min (ref >60.00)
GLUCOSE: 100 mg/dL — AB (ref 70–99)
POTASSIUM: 4.3 meq/L (ref 3.5–5.1)
Sodium: 135 mEq/L (ref 135–145)
TOTAL PROTEIN: 6.7 g/dL (ref 6.0–8.3)
Total Bilirubin: 0.3 mg/dL (ref 0.2–1.2)

## 2016-01-04 LAB — CBC
HEMATOCRIT: 35.1 % — AB (ref 36.0–46.0)
Hemoglobin: 12 g/dL (ref 12.0–15.0)
MCHC: 34.2 g/dL (ref 30.0–36.0)
MCV: 92.9 fl (ref 78.0–100.0)
Platelets: 235 10*3/uL (ref 150.0–400.0)
RBC: 3.78 Mil/uL — AB (ref 3.87–5.11)
RDW: 13.4 % (ref 11.5–15.5)
WBC: 5.2 10*3/uL (ref 4.0–10.5)

## 2016-01-04 LAB — T4, FREE: FREE T4: 0.85 ng/dL (ref 0.60–1.60)

## 2016-01-04 LAB — TSH: TSH: 1.04 u[IU]/mL (ref 0.35–4.50)

## 2016-01-04 LAB — VITAMIN D 25 HYDROXY (VIT D DEFICIENCY, FRACTURES): VITD: 17.33 ng/mL — ABNORMAL LOW (ref 30.00–100.00)

## 2016-01-04 NOTE — Progress Notes (Signed)
Pre visit review using our clinic review tool, if applicable. No additional management support is needed unless otherwise documented below in the visit note. 

## 2016-01-04 NOTE — Patient Instructions (Signed)
We do not need to move up the CT of the chest, it is okay to keep in April.   We are checking the labs today and will call you back with the results.

## 2016-01-07 NOTE — Assessment & Plan Note (Signed)
Flare due to this recent fall, she has tried ibuprofen for the pain and the demerol which has helped some. Likely muscular strain in several spots. BP same in both arms (small thoracic aneurysm) which makes rupture less likely as well as story does not match. She will continue using this and heat for the next 1-2 weeks and if no improvement needs repeat evaluation.

## 2016-01-07 NOTE — Progress Notes (Signed)
   Subjective:    Patient ID: Marissa [name], female    DOB: [birth year], 70 y.o.   MRN: [id number]  HPI The patient is a 70 YO female coming in for fall. She fell on her basement stairs due to something left on one of the stairs she tripped on. She landed on her left shoulder and hip. Was able to walk right after. No head injury or LOC. Did not seek care at the time. Still having some muscle aching and pain in her shoulder, hip and chest area. Not with activity but twisting and bending. Taking some of her prn meds and ibuprofen for mild relief. Took her demerol which was helpful as well.   Review of Systems  Constitutional: Positive for fatigue. Negative for fever, activity change and unexpected weight change.  Eyes: Negative.   Respiratory: Negative for cough, chest tightness, shortness of breath and wheezing.   Cardiovascular: Negative for chest pain and leg swelling.  Gastrointestinal: Negative for abdominal pain, diarrhea, constipation and abdominal distention.  Musculoskeletal: Positive for myalgias and arthralgias. Negative for back pain and gait problem.  Skin: Negative.   Neurological: Positive for weakness. Negative for dizziness, light-headedness, numbness and headaches.  Psychiatric/Behavioral: Positive for sleep disturbance. Negative for dysphoric mood and decreased concentration.      Objective:   Physical Exam  Constitutional: She is oriented to person, place, and time. She appears well-developed and well-nourished.  HENT:  Head: Normocephalic and atraumatic.  Eyes: EOM are normal.  Neck: Normal range of motion.  Cardiovascular: Normal rate and regular rhythm.   Pulmonary/Chest: Effort normal and breath sounds normal. She has no wheezes.  Abdominal: Soft. Bowel sounds are normal. She exhibits no distension. There is no tenderness. There is no rebound.  Musculoskeletal: She exhibits tenderness.  ROM intact left shoulder but pain with ROM. Muscles sore to touch left side and  flank  Neurological: She is alert and oriented to person, place, and time. Coordination normal.  Skin: Skin is warm and dry.   Filed Vitals:   01/04/16 1526 01/04/16 1607  BP: 184/100 168/90  Pulse: 64   Temp: 98.4 F (36.9 C)   TempSrc: Oral   Resp: 16   Height: 5' 4.5" (1.638 m)   Weight: 139 lb (63.05 kg)   SpO2: 98%       Assessment & Plan:

## 2016-01-08 ENCOUNTER — Encounter: Payer: Self-pay | Admitting: Internal Medicine

## 2016-01-08 MED ORDER — VITAMIN D (ERGOCALCIFEROL) 1.25 MG (50000 UNIT) PO CAPS: 50000.0000 [IU] | ORAL_CAPSULE | ORAL | Status: DC

## 2016-01-08 MED ORDER — LEVOTHYROXINE SODIUM 125 MCG PO TABS: 125.0000 ug | ORAL_TABLET | Freq: Every day | ORAL | Status: DC

## 2016-01-09 ENCOUNTER — Encounter: Payer: Self-pay | Admitting: Internal Medicine

## 2016-01-09 MED ORDER — MIRABEGRON ER 50 MG PO TB24: 50.0000 mg | ORAL_TABLET | Freq: Every day | ORAL | Status: DC

## 2016-01-09 NOTE — Telephone Encounter (Signed)
I think she needs PA or formulary exception for meperidine, see 2016 for old form.

## 2016-01-13 ENCOUNTER — Other Ambulatory Visit: Payer: Self-pay | Admitting: Internal Medicine

## 2016-01-15 ENCOUNTER — Encounter: Payer: Self-pay | Admitting: Internal Medicine

## 2016-01-15 ENCOUNTER — Telehealth: Payer: Self-pay

## 2016-01-15 NOTE — Telephone Encounter (Signed)
PA initiated via CoverMyMeds Key [id number]

## 2016-01-15 NOTE — Telephone Encounter (Signed)
Faxed script back to [facility].../lmb 

## 2016-01-17 NOTE — Telephone Encounter (Signed)
PA denied - Age and High risk medication association. Please advise on alternative treatment, thanks  FYI - I informed insurance company of pt allergies and advised that she has been stable on current regimen, PA and subsequent phone call DENIED

## 2016-01-18 ENCOUNTER — Other Ambulatory Visit: Payer: Self-pay | Admitting: Thoracic Surgery (Cardiothoracic Vascular Surgery)

## 2016-01-18 DIAGNOSIS — I712 Thoracic aortic aneurysm, without rupture, unspecified: Secondary | ICD-10-CM

## 2016-01-18 NOTE — Telephone Encounter (Signed)
Can we call and request an exception?

## 2016-01-20 ENCOUNTER — Encounter: Payer: Self-pay | Admitting: Internal Medicine

## 2016-01-23 ENCOUNTER — Encounter: Payer: Self-pay | Admitting: Internal Medicine

## 2016-01-24 ENCOUNTER — Other Ambulatory Visit: Payer: Self-pay | Admitting: Thoracic Surgery (Cardiothoracic Vascular Surgery)

## 2016-01-24 DIAGNOSIS — I712 Thoracic aortic aneurysm, without rupture, unspecified: Secondary | ICD-10-CM

## 2016-01-26 ENCOUNTER — Telehealth: Payer: Self-pay

## 2016-01-26 NOTE — Telephone Encounter (Signed)
PA initiated via CoverMyMeds key [facility]

## 2016-01-26 NOTE — Telephone Encounter (Signed)
PA Approved through 01/25/2017.  Pt advised of same

## 2016-01-26 NOTE — Telephone Encounter (Signed)
Pt states:  Dr. [name]    I have been refused a medical  "exception" on the meperidine (demerol) due to the exception being based on the"only diagnosis" as Fibromyalgia.     I received the letter and a phone call .     I take the drug for a lot more pain syndromes than fibromyalgiia. I treat sleep disorder, spinal and nerve pain, and also, I use it because I have no side effects with my 1/2 to 1 pill dosage of 1 time a day if that.        [employer] expects more information regarding my failure and trials on other meds (also allergies to pain meds). They said meperidine is not the "standard for Fibromyalgia", but that is not all of the reason I have been prescribed it .         They said they would call your office and send another letter. My letter stated that the refusal was the Fibromyalgia diagnosis.    Per [facility] Appeal dept, additional clinical information can be submitted via fax [phone]).  Dr. [name], would you please generate a physician statement to include additional diagnosis for submission? Thanks

## 2016-01-27 ENCOUNTER — Other Ambulatory Visit: Payer: Self-pay | Admitting: Internal Medicine

## 2016-01-31 ENCOUNTER — Emergency Department (HOSPITAL_BASED_OUTPATIENT_CLINIC_OR_DEPARTMENT_OTHER)
Admission: EM | Admit: 2016-01-31 | Discharge: 2016-01-31 | Disposition: A | Discharge status: Home / Self Care | Payer: Medicare Other | Attending: Emergency Medicine | Admitting: Emergency Medicine

## 2016-01-31 ENCOUNTER — Encounter (HOSPITAL_BASED_OUTPATIENT_CLINIC_OR_DEPARTMENT_OTHER): Payer: Self-pay

## 2016-01-31 ENCOUNTER — Emergency Department (HOSPITAL_BASED_OUTPATIENT_CLINIC_OR_DEPARTMENT_OTHER): Payer: Medicare Other

## 2016-01-31 DIAGNOSIS — Z79899 Other long term (current) drug therapy: Secondary | ICD-10-CM | POA: Diagnosis not present

## 2016-01-31 DIAGNOSIS — W19XXXA Unspecified fall, initial encounter: Secondary | ICD-10-CM

## 2016-01-31 DIAGNOSIS — W010XXA Fall on same level from slipping, tripping and stumbling without subsequent striking against object, initial encounter: Secondary | ICD-10-CM | POA: Insufficient documentation

## 2016-01-31 DIAGNOSIS — Y929 Unspecified place or not applicable: Secondary | ICD-10-CM | POA: Insufficient documentation

## 2016-01-31 DIAGNOSIS — S50812A Abrasion of left forearm, initial encounter: Secondary | ICD-10-CM | POA: Diagnosis not present

## 2016-01-31 DIAGNOSIS — Y999 Unspecified external cause status: Secondary | ICD-10-CM | POA: Diagnosis not present

## 2016-01-31 DIAGNOSIS — E785 Hyperlipidemia, unspecified: Secondary | ICD-10-CM | POA: Diagnosis not present

## 2016-01-31 DIAGNOSIS — J45909 Unspecified asthma, uncomplicated: Secondary | ICD-10-CM | POA: Insufficient documentation

## 2016-01-31 DIAGNOSIS — Y9389 Activity, other specified: Secondary | ICD-10-CM | POA: Diagnosis not present

## 2016-01-31 DIAGNOSIS — R0781 Pleurodynia: Secondary | ICD-10-CM | POA: Diagnosis not present

## 2016-01-31 DIAGNOSIS — I10 Essential (primary) hypertension: Secondary | ICD-10-CM | POA: Diagnosis not present

## 2016-01-31 DIAGNOSIS — S20212A Contusion of left front wall of thorax, initial encounter: Secondary | ICD-10-CM | POA: Diagnosis not present

## 2016-01-31 DIAGNOSIS — S299XXA Unspecified injury of thorax, initial encounter: Secondary | ICD-10-CM | POA: Diagnosis present

## 2016-01-31 HISTORY — DX: Bursopathy, unspecified: M71.9

## 2016-01-31 MED ORDER — MORPHINE SULFATE (PF) 4 MG/ML IV SOLN: 8.0000 mg | Freq: Once | INTRAVENOUS | Status: DC

## 2016-01-31 MED ORDER — HYDROMORPHONE HCL 1 MG/ML IJ SOLN
0.5000 mg | Freq: Once | INTRAMUSCULAR | Status: AC
  Administered 2016-01-31: 0.5 mg via INTRAVENOUS
  Filled 2016-01-31: qty 1

## 2016-01-31 MED ORDER — DIAZEPAM 5 MG PO TABS
5.0000 mg | ORAL_TABLET | Freq: Once | ORAL | Status: AC
  Administered 2016-01-31: 5 mg via ORAL
  Filled 2016-01-31: qty 1

## 2016-01-31 MED ORDER — MORPHINE SULFATE (PF) 4 MG/ML IV SOLN: 4.0000 mg | INTRAVENOUS | Status: DC | PRN

## 2016-01-31 MED ORDER — HYDROMORPHONE HCL 1 MG/ML IJ SOLN
0.5000 mg | INTRAMUSCULAR | Status: DC | PRN
Start: 2016-01-31 — End: 2016-01-31
  Administered 2016-01-31: 0.5 mg via INTRAVENOUS
  Filled 2016-01-31: qty 1

## 2016-01-31 MED ORDER — ONDANSETRON HCL 4 MG/2ML IJ SOLN
4.0000 mg | Freq: Four times a day (QID) | INTRAMUSCULAR | Status: DC | PRN
  Administered 2016-01-31: 4 mg via INTRAVENOUS
  Filled 2016-01-31: qty 2

## 2016-01-31 MED ORDER — PROMETHAZINE HCL 25 MG PO TABS: 25.0000 mg | ORAL_TABLET | Freq: Four times a day (QID) | ORAL | Status: DC | PRN

## 2016-01-31 MED FILL — PROMETHAZINE 25 MG TABLET: 25 | 3 days supply | Qty: 12 | Fill #0

## 2016-01-31 NOTE — Discharge Instructions (Signed)
Chest Contusion A chest contusion is a deep bruise on your chest area. Contusions are the result of an injury that caused bleeding under the skin. A chest contusion may involve bruising of the skin, muscles, or ribs. The contusion may turn blue, purple, or yellow. Minor injuries will give you a painless contusion, but more severe contusions may stay painful and swollen for a few weeks. CAUSES  A contusion is usually caused by a blow, trauma, or direct force to an area of the body. SYMPTOMS   Swelling and redness of the injured area.  Discoloration of the injured area.  Tenderness and soreness of the injured area.  Pain. DIAGNOSIS  The diagnosis can be made by taking a history and performing a physical exam. An X-ray, CT scan, or MRI may be needed to determine if there were any associated injuries, such as broken bones (fractures) or internal injuries. TREATMENT  Often, the best treatment for a chest contusion is resting, icing, and applying cold compresses to the injured area. Deep breathing exercises may be recommended to reduce the risk of pneumonia. Over-the-counter medicines may also be recommended for pain control. HOME CARE INSTRUCTIONS   Put ice on the injured area.  Put ice in a plastic bag.  Place a towel between your skin and the bag.  Leave the ice on for 15-20 minutes, 03-04 times a day.  Only take over-the-counter or prescription medicines as directed by your caregiver. Your caregiver may recommend avoiding anti-inflammatory medicines (aspirin, ibuprofen, and naproxen) for 48 hours because these medicines may increase bruising.  Rest the injured area.  Perform deep-breathing exercises as directed by your caregiver.  Stop smoking if you smoke.  Do not lift objects over 5 pounds (2.3 kg) for 3 days or longer if recommended by your caregiver. SEEK IMMEDIATE MEDICAL CARE IF:   You have increased bruising or swelling.  You have pain that is getting worse.  You have  difficulty breathing.  You have dizziness, weakness, or fainting.  You have blood in your urine or stool.  You cough up or vomit blood.  Your swelling or pain is not relieved with medicines. MAKE SURE YOU:   Understand these instructions.  Will watch your condition.  Will get help right away if you are not doing well or get worse.   This information is not intended to replace advice given to you by your health care provider. Make sure you discuss any questions you have with your health care provider.   Document Released: 07/09/2001 Document Revised: 07/08/2012 Document Reviewed: 04/06/2012 Elsevier Interactive Patient Education [employer].

## 2016-01-31 NOTE — ED Notes (Signed)
Slipped in oil approx 1 hour PTA-pain to left shoulder and left mid back-steady gait-grimacing

## 2016-01-31 NOTE — ED Provider Notes (Signed)
CSN: [id number]     Arrival date & time 01/31/16  1052 History   First MD Initiated Contact with Patient 01/31/16 1101     Chief Complaint  Patient presents with  . Fall     HPI Patient was waxing or wood floors today when she slipped and fell onto her left side.  She presents with moderate to severe left lateral and left posterior rib pain.  She drove herself the emergency department.  She reports her pain is moderate to severe and worse with palpation of her left lateral posterior chest as well as with movement.  She denies weakness of her arms or legs.  She denies midline low back pain.  She denies pain with range of motion of her left hip.  She denies head injury.  No neck pain.  No significant shortness of breath.  She denies use of anticoagulants   Past Medical History  Diagnosis Date  . Thyroid disease   . Asthma   . MVP (mitral valve prolapse)   . Migraine   . Dysthymia   . Dysthymia   . Arthritis   . Thoracic aortic aneurysm ([place])   . Cataract     mild  . Hyperlipidemia   . Hypertension     managed   . Hemorrhoids   . Hx of ulcerative colitis     per dr Arnoldo Morale as per pt.  . 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 Scoliosis   . Spinal stenosis   . Bursitis    Past Surgical History  Procedure Laterality Date  . Abdominal hysterectomy    . Tonsillectomy and adenoidectomy    . Sinus irrigation    . Hand surgery      left thumb joint resection  . Foot surgery      3 pins in toes   . Nasal revision    . Bunionectomy    . Colonoscopy  02-04-2005    all normal    Family History  Problem Relation Age of Onset  . Arthritis Mother   . Leukemia Father   . Colon cancer Neg Hx    Social History  Substance Use Topics  . Smoking status: Never Smoker   . Smokeless tobacco: Never Used  . Alcohol Use: 0.0 oz/week    0 Standard drinks or equivalent  per week     Comment: occ    OB History    No data available     Review of Systems  All other systems reviewed and are negative.     Allergies  Fentanyl; Amoxicillin; Codeine; Conjugated estrogens; Erythromycin; and Piroxicam  Home Medications   Prior to Admission medications   Medication Sig Start Date End Date Taking? Authorizing Provider  budesonide-formoterol (SYMBICORT) 80-4.5 MCG/ACT inhaler Take 2 puffs first thing in am and then another 2 puffs about 12 hours later. 08/16/14   Tanda Rockers, MD  carisoprodol (SOMA) 350 MG tablet TAKE 1 TABLET BY MOUTH THREE TIMES DAILY AS NEEDED 12/28/15   [name], MD  [facility] CORTISONE MAXIMUM STRENGTH 1 %  11/03/14   Historical Provider, MD  cycloSPORINE (RESTASIS) 0.05 % ophthalmic emulsion Place 1 drop into both eyes 2 (two) times daily.    Historical Provider, MD  doxycycline (VIBRA-TABS) 100 MG tablet Take 1 tablet (100 mg total) by mouth 2 (two) times  daily. Patient taking differently: Take 50 mg by mouth 2 (two) times daily.  08/18/15   [name], MD  Eszopiclone 3 MG TABS TAKE 1 TABLET BY MOUTH EVERY NIGHT AT BEDTIME 01/15/16   [name], MD  FLUoxetine (PROZAC) 40 MG capsule TAKE ONE CAPSULE BY MOUTH TWICE A DAY 10/31/15   [name], MD  gabapentin (NEURONTIN) 100 MG capsule TAKE 2 CAPSULES (200 MG TOTAL) BY MOUTH AT BEDTIME. 10/06/15   Lyndal Pulley, DO  hydrocortisone (PROCTOCORT) 1 % CREA Applied twice a day 10/05/14   [name], MD  levothyroxine (SYNTHROID, LEVOTHROID) 125 MCG tablet Take 1 tablet (125 mcg total) by mouth daily. 01/08/16   [name], MD  meperidine (DEMEROL) 50 MG tablet Take 1 tablet (50 mg total) by mouth every 6 (six) hours as needed for severe pain. One every 4 hours if coughing 12/25/15   [name], MD  metroNIDAZOLE (METROGEL) 0.75 % vaginal gel  08/30/14   Historical Provider, MD  METRONIDAZOLE, TOPICAL, 0.75 % LOTN Applied to your face  twice daily. 09/12/14   [name], MD  MINIVELLE 0.05 MG/24HR patch Place 1 patch onto the skin 2 (two) times a week.  09/04/14   Historical Provider, MD  mirabegron ER (MYRBETRIQ) 50 MG TB24 tablet Take 1 tablet (50 mg total) by mouth daily. 01/09/16   [name], MD  mometasone (NASONEX) 50 MCG/ACT nasal spray Place 2 sprays into the nose daily. 01/06/15   [name], MD  montelukast (SINGULAIR) 10 MG tablet TAKE 1 TABLET (10 MG TOTAL) BY MOUTH AT BEDTIME. 06/06/15   [name], MD  naproxen (NAPROSYN) 500 MG tablet TAKE 1 TABLET BY MOUTH TWICE DAILY WITH MEAL 12/28/15   [name], MD  nystatin-triamcinolone [facility]) cream  11/25/14   Historical Provider, MD  oxybutynin (DITROPAN) 5 MG tablet Take 1 tablet (5 mg total) by mouth 2 (two) times daily. 08/31/15   [name], MD  PROAIR HFA 108 (90 BASE) MCG/ACT inhaler INHALE 2 PUFFS INTO THE LUNGS EVERY 6 (SIX) HOURS AS NEEDED. 09/01/14   [name], MD  valsartan (DIOVAN) 160 MG tablet TAKE 1 TABLET BY MOUTH EVERY DAY 11/01/15   [name], MD  verapamil (CALAN-SR) 240 MG CR tablet TAKE 1 TABLET BY MOUTH EVERY NIGHT AT BEDTIME 01/29/16   [name], MD  Vitamin D, Ergocalciferol, (DRISDOL) 50000 units CAPS capsule Take 1 capsule (50,000 Units total) by mouth every 7 (seven) days. 01/08/16   [name], MD   BP 155/68 mmHg  Pulse 66  Temp(Src) 97.8 F (36.6 C) (Oral)  Resp 18  Ht 5' 4.5" (1.638 m)  Wt 137 lb (62.143 kg)  BMI 23.16 kg/m2  SpO2 97% Physical Exam  Constitutional: She is oriented to person, place, and time. She appears well-developed and well-nourished. No distress.  HENT:  Head: Normocephalic and atraumatic.  Eyes: EOM are normal.  Neck: Normal range of motion. Neck supple.  C-spine nontender.  C-spine cleared by Nexus criteria.  Cardiovascular: Normal rate, regular rhythm and normal heart sounds.   Pulmonary/Chest: Effort normal and  breath sounds normal.  Left lateral left posterior rib tenderness without obvious deformity or crepitus.  No bruising noted.  Abdominal: Soft. She exhibits no distension. There is no tenderness.  Musculoskeletal: Normal range of motion.  Full range of motion bilateral knees, hips, ankles.  Small abrasion to the posterior proximal left forearm  without active bleeding.  Superficial abrasions in this region.  Full range of motion of left shoulder and left elbow.  Normal left radial pulse.  No thoracic or lumbar point tenderness.  Neurological: She is alert and oriented to person, place, and time.  Skin: Skin is warm and dry.  Psychiatric: She has a normal mood and affect. Judgment normal.  Nursing note and vitals reviewed.   ED Course  Procedures (including critical care time) Labs Review Labs Reviewed - No data to display  Imaging Review Dg Chest 2 View  01/31/2016  CLINICAL DATA:  Fall today landing on left side. Left lower anterior rib pain. EXAM: CHEST  2 VIEW COMPARISON:  None. FINDINGS: Heart and mediastinal contours are within normal limits. No focal opacities or effusions. No acute bony abnormality. No visible rib fracture or pneumothorax. IMPRESSION: No active cardiopulmonary disease. Electronically Signed   By: Rolm Baptise M.D.   On: 01/31/2016 12:38   I have personally reviewed and evaluated these images and lab results as part of my medical decision-making.   EKG Interpretation None      MDM   Final diagnoses:  None    Mechanical fall with contusion.  No obvious x-ray noted on chest x-ray.  C-spine cleared by Nexus criteria.  Ambulatory in the emergency department.  Will focus on symptom control at this time.  1:35 PM Pain improving at this time.  Still with some pain.  Patient's chest x-rays without rib fracture.  Patient is on Demerol at home.  Recommended that she continue taking her Demerol for pain every 4-6 hours as needed.  She has muscle relaxants at home.   Recommended ice and heat for symptom control.  I recommended that she keep moving and perform deep breathing so as to minimize atelectasis and pneumonia risk.  No indication for additional workup at this time.  Vital signs are normal.    Jola Schmidt, MD 01/31/16 1335

## 2016-02-03 ENCOUNTER — Encounter: Payer: Self-pay | Admitting: Internal Medicine

## 2016-02-09 ENCOUNTER — Other Ambulatory Visit: Payer: Self-pay | Admitting: Internal Medicine

## 2016-02-10 ENCOUNTER — Other Ambulatory Visit: Payer: Self-pay | Admitting: Internal Medicine

## 2016-02-12 ENCOUNTER — Telehealth: Payer: Self-pay | Admitting: Internal Medicine

## 2016-02-12 MED ORDER — ESZOPICLONE 3 MG PO TABS: 3.0000 mg | ORAL_TABLET | Freq: Every day | ORAL | Status: DC

## 2016-02-12 NOTE — Telephone Encounter (Signed)
Please call [employer] at [phone]. You just talked with her and she had one more question.

## 2016-02-12 NOTE — Telephone Encounter (Signed)
[employer] needed verbal acknowledgment that the risk vs benefit had been discussed with the member

## 2016-02-12 NOTE — Telephone Encounter (Signed)
Shalonda @ [employer] called to acknowledge receipt of Appeal.  She states that process can take up to 7 days

## 2016-02-12 NOTE — Telephone Encounter (Signed)
Letter faxed.

## 2016-02-12 NOTE — Telephone Encounter (Signed)
Letter printed and signed.  

## 2016-02-12 NOTE — Addendum Note (Signed)
Addended by: Pricilla Holm A on: 02/12/2016 09:50 AM   Modules accepted: Orders

## 2016-02-13 ENCOUNTER — Ambulatory Visit: Payer: Medicare Other | Admitting: Thoracic Surgery (Cardiothoracic Vascular Surgery)

## 2016-02-13 ENCOUNTER — Other Ambulatory Visit: Payer: Medicare Other

## 2016-02-13 NOTE — Telephone Encounter (Signed)
Marissa [name] with  [employer] Appeal called requesting additional clinical information: is patient taking this medication for acute pain flares or for chronic pain? Please advise, thanks!  [phone]

## 2016-02-13 NOTE — Telephone Encounter (Signed)
Chronic. 

## 2016-02-14 NOTE — Telephone Encounter (Signed)
Appeal has been APPROVED.  Written notification will be mailed to the office and the patient will be contacted directly by [facility]

## 2016-02-14 NOTE — Telephone Encounter (Signed)
Tamika at [facility] advised via confidential VM

## 2016-02-15 NOTE — Telephone Encounter (Signed)
Pt advised of approval

## 2016-02-25 ENCOUNTER — Other Ambulatory Visit: Payer: Self-pay | Admitting: Internal Medicine

## 2016-03-04 ENCOUNTER — Other Ambulatory Visit: Payer: Self-pay | Admitting: Internal Medicine

## 2016-03-05 ENCOUNTER — Other Ambulatory Visit: Payer: Self-pay | Admitting: Internal Medicine

## 2016-03-05 MED ORDER — MEPERIDINE HCL 50 MG PO TABS: 50.0000 mg | ORAL_TABLET | Freq: Four times a day (QID) | ORAL | Status: DC | PRN

## 2016-03-05 NOTE — Addendum Note (Signed)
Addended by: Mauricio Po D on: 03/05/2016 01:11 PM   Modules accepted: Orders

## 2016-03-07 DIAGNOSIS — I712 Thoracic aortic aneurysm, without rupture: Secondary | ICD-10-CM | POA: Diagnosis not present

## 2016-03-08 LAB — CREATININE, SERUM: Creat: 0.47 mg/dL — ABNORMAL LOW (ref 0.60–0.93)

## 2016-03-12 ENCOUNTER — Ambulatory Visit
Admission: RE | Admit: 2016-03-12 | Discharge: 2016-03-12 | Disposition: A | Discharge status: Home / Self Care | Payer: Medicare Other | Source: Ambulatory Visit | Attending: Thoracic Surgery (Cardiothoracic Vascular Surgery) | Admitting: Thoracic Surgery (Cardiothoracic Vascular Surgery)

## 2016-03-12 ENCOUNTER — Ambulatory Visit: Payer: Medicare Other | Admitting: Thoracic Surgery (Cardiothoracic Vascular Surgery)

## 2016-03-12 DIAGNOSIS — I712 Thoracic aortic aneurysm, without rupture, unspecified: Secondary | ICD-10-CM

## 2016-03-12 MED ORDER — GADOBENATE DIMEGLUMINE 529 MG/ML IV SOLN
12.0000 mL | Freq: Once | INTRAVENOUS | Status: AC | PRN
  Administered 2016-03-12: 12 mL via INTRAVENOUS

## 2016-03-19 ENCOUNTER — Encounter: Payer: Self-pay | Admitting: Internal Medicine

## 2016-03-19 ENCOUNTER — Ambulatory Visit (INDEPENDENT_AMBULATORY_CARE_PROVIDER_SITE_OTHER): Payer: Medicare Other | Admitting: Thoracic Surgery (Cardiothoracic Vascular Surgery)

## 2016-03-19 ENCOUNTER — Encounter: Payer: Self-pay | Admitting: Thoracic Surgery (Cardiothoracic Vascular Surgery)

## 2016-03-19 VITALS — BP 150/68 | HR 68 | Resp 20 | Ht 64.5 in | Wt 137.0 lb

## 2016-03-19 DIAGNOSIS — I712 Thoracic aortic aneurysm, without rupture: Secondary | ICD-10-CM | POA: Diagnosis not present

## 2016-03-19 DIAGNOSIS — I7121 Aneurysm of the ascending aorta, without rupture: Secondary | ICD-10-CM

## 2016-03-19 NOTE — Progress Notes (Signed)
[address]       [place] [address]             [phone]      HPI: [name] returns for one year follow-up of her ascending aneurysm..  She is a 70 year old woman with history of hypertension who was found to have an ascending aortic aneurysm in 2012. She has been followed on an annual basis since then. She was last seen a year ago which time the aneurysm was stable.   In the interim since her last visit she was doing well until she fell about 7 weeks ago. She landed on her left side and had severe left-sided chest pain. A chest x-ray in the emergency room did not show any rib fractures. She recently was started on gabapentin 200 mg by mouth daily at bedtime and her pain has improved. The bruising has resolved. She has not had any substernal chest discomfort or any severe back pain. She says that her blood pressure has been "up and down."  Past Medical History  Diagnosis Date  . Thyroid disease   . Asthma   . MVP (mitral valve prolapse)   . Migraine   . Dysthymia   . Dysthymia   . Arthritis   . Thoracic aortic aneurysm ([place])   . Cataract     mild  . Hyperlipidemia   . Hypertension     managed   . Hemorrhoids   . Hx of ulcerative colitis     per dr Arnoldo Morale as per pt.  . 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 Scoliosis   . Spinal stenosis   . Bursitis      Current Outpatient Prescriptions  Medication Sig Dispense Refill  . carisoprodol (SOMA) 350 MG tablet TAKE 1 TABLET BY MOUTH THREE TIMES DAILY AS NEEDED 60 tablet 3  . [facility]S CORTISONE MAXIMUM STRENGTH 1 %   12  . cycloSPORINE (RESTASIS) 0.05 % ophthalmic emulsion Place 1 drop into both eyes 2 (two) times daily.    [name] Kitchen doxycycline (VIBRA-TABS) 100 MG tablet Take 1 tablet (100 mg total) by mouth 2 (two) times daily. (Patient taking differently: Take 50 mg by mouth 2 (two) times  daily. ) 28 tablet 1  . Eszopiclone 3 MG TABS Take 1 tablet (3 mg total) by mouth at bedtime. Take immediately before bedtime 30 tablet 5  . FLUoxetine (PROZAC) 40 MG capsule TAKE ONE CAPSULE BY MOUTH TWICE A DAY 60 capsule 1  . gabapentin (NEURONTIN) 100 MG capsule TAKE 2 CAPSULES (200 MG TOTAL) BY MOUTH AT BEDTIME. 60 capsule 5  . levothyroxine (SYNTHROID, LEVOTHROID) 125 MCG tablet Take 1 tablet (125 mcg total) by mouth daily. 90 tablet 3  . meperidine (DEMEROL) 50 MG tablet Take 1 tablet (50 mg total) by mouth every 6 (six) hours as needed for severe pain. One every 4 hours if coughing 90 tablet 0  . metroNIDAZOLE (METROGEL) 0.75 % vaginal gel   0  . METRONIDAZOLE, TOPICAL, 0.75 % LOTN Applied to your face twice daily. 118 mL 6  . MINIVELLE 0.05 MG/24HR patch Place 1 patch onto the skin 2 (two) times a week.   3  . mirabegron ER (MYRBETRIQ) 50 MG TB24 tablet Take 1 tablet (50 mg total) by mouth daily. 90 tablet 3  . montelukast (SINGULAIR)  10 MG tablet TAKE 1 TABLET BY MOUTH EVERY NIGHT AT BEDTIME 90 tablet 0  . naproxen (NAPROSYN) 500 MG tablet TAKE 1 TABLET BY MOUTH TWICE DAILY WITH MEAL 60 tablet 2  . PROAIR HFA 108 (90 BASE) MCG/ACT inhaler INHALE 2 PUFFS INTO THE LUNGS EVERY 6 (SIX) HOURS AS NEEDED. 8.5 each 2  . promethazine (PHENERGAN) 25 MG tablet Take 1 tablet (25 mg total) by mouth every 6 (six) hours as needed for nausea. 12 tablet 0  . valsartan (DIOVAN) 160 MG tablet TAKE 1 TABLET BY MOUTH EVERY DAY 90 tablet 0  . verapamil (CALAN-SR) 240 MG CR tablet TAKE 1 TABLET BY MOUTH EVERY NIGHT AT BEDTIME 90 tablet 0   No current facility-administered medications for this visit.    Physical Exam BP 150/68 mmHg  Pulse 68  Resp 20  Ht 5' 4.5" (1.638 m)  Wt 137 lb (62.143 kg)  BMI 23.16 kg/m2  SpO71 65% 70 year old woman in no acute distress Alert and oriented 3 with no focal deficits No carotid bruits Cardiac regular rate and rhythm with a 2/6 systolic murmur and a loud  click Lungs clear with equal breath sounds bilaterally Tenderness to palpation left lateral chest wall, no deformity  Diagnostic Tests: MRA CHEST WITH CONTRAST  TECHNIQUE: Multiplanar, multiecho pulse sequences of the chest were obtained with intravenous contrast. Angiographic images of chest were obtained using MRA technique with intravenous contrast.  CONTRAST: 48m MULTIHANCE GADOBENATE DIMEGLUMINE 529 MG/ML IV SOLN  COMPARISON: 02/14/2015  FINDINGS: Maximal diameter of the ascending aorta is 4.2 cm. Innominate artery, left common carotid artery, and left subclavian artery are patent within the thorax. Aortic arch and descending thoracic aorta are non aneurysmal. There is no evidence of aortic dissection or intramural hematoma. No evidence of mediastinal mass. Patchy ground-glass in lungs likely reflects volume loss. Tiny pleural effusions left greater than right.  IMPRESSION: Maximal diameter of the ascending aorta is stable at 4.2 cm. Recommend annual imaging followup by CTA or MRA. This recommendation follows 2010 ACCF/AHA/AATS/ACR/ASA/SCA/SCAI/SIR/STS/SVM Guidelines for the Diagnosis and Management of Patients with Thoracic Aortic Disease. Circulation. 2010; 121: [id number] Tiny pleural effusions left greater than right.   Electronically Signed  By: AMarybelle KillingsM.D.  On: 03/12/2016 11:33  I personally reviewed the MR angiogram and concur with findings as noted above regarding the ascending aorta. She also appears to have 3 nondisplaced rib fractures on the left.  Impression: [age] year old woman with a 4.2 cm ascending aortic aneurysm. This has remained stable over time. She needs continued annual follow-up.  Hypertension- her blood pressure is elevated today. She sees Dr. CSharlet Salinanext week, so I'll defer to her regarding blood pressure management.  Left chest wall pain status post fall- reviewing her MRI she appears to have 3 nondisplaced rib fractures  which would explain her pain. Her pain has improved with gabapentin. This will likely take several months to resolve.  Plan: Follow-up with Dr. CSharlet Salinaregarding blood pressure  I will see her back in one year with an MR angiogram of the chest.  SMelrose Nakayama MD [facility] ([phone]

## 2016-03-28 ENCOUNTER — Encounter: Payer: Self-pay | Admitting: Internal Medicine

## 2016-03-28 ENCOUNTER — Ambulatory Visit (INDEPENDENT_AMBULATORY_CARE_PROVIDER_SITE_OTHER): Payer: Medicare Other | Admitting: Internal Medicine

## 2016-03-28 VITALS — BP 132/68 | HR 68 | Temp 98.0°F | Resp 14 | Ht 64.5 in | Wt 136.0 lb

## 2016-03-28 DIAGNOSIS — R296 Repeated falls: Secondary | ICD-10-CM | POA: Insufficient documentation

## 2016-03-28 DIAGNOSIS — J4520 Mild intermittent asthma, uncomplicated: Secondary | ICD-10-CM

## 2016-03-28 DIAGNOSIS — M797 Fibromyalgia: Secondary | ICD-10-CM

## 2016-03-28 NOTE — Assessment & Plan Note (Signed)
Talked to her about fall safety and night lights as her night time vision is not that good. She is working on this and talked to her about socks with grips on the bottom and she will think about this. She is being careful not to fall.

## 2016-03-28 NOTE — Assessment & Plan Note (Signed)
Exacerbated by recent falls. She is taking more demerol and gabapentin.

## 2016-03-28 NOTE — Progress Notes (Signed)
Pre visit review using our clinic review tool, if applicable. No additional management support is needed unless otherwise documented below in the visit note. 

## 2016-03-28 NOTE — Assessment & Plan Note (Signed)
No exacerbation right now and she is working on deep breathing exercises to help keep lungs expanded.

## 2016-03-28 NOTE — Progress Notes (Signed)
   Subjective:    Patient ID: Marissa [name], female    DOB: [birth year], 70 y.o.   MRN: [id number]  HPI The patient is a 70 YO female coming in for ER follow up (had several falls with 4 cracked ribs, using her demerol and gabapentin for pain at night and ice packs during the day). Still having significant pain. Gradually improving. Not able to take deep breaths still but doing better. She was trying to refinish her hardwood floors and the stuff she was using was very slippery. She then slipped on the hardwood floors in her socks. She is now sleeping in bare feet to help that problem. Denies LOC since that time. Did have LOC with one of those falls. No nausea or vomiting afterwards, no headaches. She is having muscle soreness all down her left side and shoulder.   Review of Systems  Constitutional: Positive for fatigue. Negative for fever, activity change and unexpected weight change.  Eyes: Negative.   Respiratory: Negative for cough, chest tightness, shortness of breath and wheezing.   Cardiovascular: Negative for chest pain and leg swelling.  Gastrointestinal: Negative for abdominal pain, diarrhea, constipation and abdominal distention.  Musculoskeletal: Positive for myalgias, arthralgias and gait problem. Negative for back pain.  Skin: Negative.   Neurological: Positive for weakness. Negative for dizziness, light-headedness, numbness and headaches.  Psychiatric/Behavioral: Positive for sleep disturbance. Negative for dysphoric mood and decreased concentration.      Objective:   Physical Exam  Constitutional: She is oriented to person, place, and time. She appears well-developed and well-nourished.  HENT:  Head: Normocephalic and atraumatic.  Eyes: EOM are normal.  Neck: Normal range of motion.  Cardiovascular: Normal rate and regular rhythm.   Pulmonary/Chest: Effort normal and breath sounds normal. She has no wheezes.  Abdominal: Soft. Bowel sounds are normal. She exhibits no  distension. There is no tenderness. There is no rebound.  Musculoskeletal: She exhibits tenderness.  Left side moderate pain to palpation, able to take a deep breath with minimal pain.   Neurological: She is alert and oriented to person, place, and time. Coordination normal.  Skin: Skin is warm and dry.   Filed Vitals:   03/28/16 1319  BP: 132/68  Pulse: 68  Temp: 98 F (36.7 C)  TempSrc: Oral  Resp: 14  Height: 5' 4.5" (1.638 m)  Weight: 136 lb (61.689 kg)  SpO2: 98%      Assessment & Plan:

## 2016-04-12 ENCOUNTER — Encounter: Payer: Self-pay | Admitting: Internal Medicine

## 2016-04-15 ENCOUNTER — Encounter: Payer: Self-pay | Admitting: Internal Medicine

## 2016-05-01 ENCOUNTER — Other Ambulatory Visit: Payer: Self-pay | Admitting: Internal Medicine

## 2016-05-09 ENCOUNTER — Other Ambulatory Visit: Payer: Self-pay | Admitting: Family

## 2016-05-09 MED ORDER — MEPERIDINE HCL 50 MG PO TABS: 50.0000 mg | ORAL_TABLET | Freq: Four times a day (QID) | ORAL | Status: DC | PRN

## 2016-05-09 NOTE — Addendum Note (Signed)
Addended by: Pricilla Holm A on: 05/09/2016 01:22 PM   Modules accepted: Orders

## 2016-05-13 ENCOUNTER — Encounter: Payer: Self-pay | Admitting: Internal Medicine

## 2016-05-13 ENCOUNTER — Other Ambulatory Visit: Payer: Self-pay | Admitting: Family Medicine

## 2016-05-14 ENCOUNTER — Other Ambulatory Visit: Payer: Self-pay | Admitting: Family Medicine

## 2016-05-14 MED ORDER — GABAPENTIN 100 MG PO CAPS: 200.0000 mg | ORAL_CAPSULE | Freq: Every day | ORAL | Status: DC

## 2016-05-15 MED ORDER — GABAPENTIN 100 MG PO CAPS: 200.0000 mg | ORAL_CAPSULE | Freq: Every day | ORAL | Status: DC

## 2016-05-29 ENCOUNTER — Other Ambulatory Visit: Payer: Self-pay | Admitting: Internal Medicine

## 2016-06-04 ENCOUNTER — Other Ambulatory Visit: Payer: Self-pay | Admitting: *Deleted

## 2016-06-04 MED ORDER — FLUOXETINE HCL 40 MG PO CAPS: 40.0000 mg | ORAL_CAPSULE | Freq: Two times a day (BID) | ORAL | 5 refills | Status: DC

## 2016-06-11 ENCOUNTER — Telehealth: Payer: Self-pay | Admitting: Internal Medicine

## 2016-06-26 ENCOUNTER — Ambulatory Visit: Payer: Medicare Other

## 2016-07-09 ENCOUNTER — Ambulatory Visit (INDEPENDENT_AMBULATORY_CARE_PROVIDER_SITE_OTHER): Payer: Medicare Other | Admitting: Internal Medicine

## 2016-07-09 ENCOUNTER — Other Ambulatory Visit (INDEPENDENT_AMBULATORY_CARE_PROVIDER_SITE_OTHER): Payer: Medicare Other

## 2016-07-09 ENCOUNTER — Encounter: Payer: Self-pay | Admitting: Internal Medicine

## 2016-07-09 DIAGNOSIS — Z23 Encounter for immunization: Secondary | ICD-10-CM

## 2016-07-09 DIAGNOSIS — E039 Hypothyroidism, unspecified: Secondary | ICD-10-CM | POA: Diagnosis not present

## 2016-07-09 DIAGNOSIS — Z0000 Encounter for general adult medical examination without abnormal findings: Secondary | ICD-10-CM | POA: Diagnosis not present

## 2016-07-09 DIAGNOSIS — Z0001 Encounter for general adult medical examination with abnormal findings: Secondary | ICD-10-CM | POA: Insufficient documentation

## 2016-07-09 LAB — CBC
HEMATOCRIT: 39.8 % (ref 36.0–46.0)
HEMOGLOBIN: 13.7 g/dL (ref 12.0–15.0)
MCHC: 34.3 g/dL (ref 30.0–36.0)
MCV: 94.1 fl (ref 78.0–100.0)
Platelets: 245 10*3/uL (ref 150.0–400.0)
RBC: 4.23 Mil/uL (ref 3.87–5.11)
RDW: 13.4 % (ref 11.5–15.5)
WBC: 6.8 10*3/uL (ref 4.0–10.5)

## 2016-07-09 LAB — COMPREHENSIVE METABOLIC PANEL
ALBUMIN: 4.3 g/dL (ref 3.5–5.2)
ALK PHOS: 61 U/L (ref 39–117)
ALT: 24 U/L (ref 0–35)
AST: 25 U/L (ref 0–37)
BILIRUBIN TOTAL: 0.5 mg/dL (ref 0.2–1.2)
BUN: 14 mg/dL (ref 6–23)
CO2: 29 mEq/L (ref 19–32)
Calcium: 9.2 mg/dL (ref 8.4–10.5)
Chloride: 96 mEq/L (ref 96–112)
Creatinine, Ser: 0.63 mg/dL (ref 0.40–1.20)
GFR: 98.85 mL/min (ref >60.00)
GLUCOSE: 101 mg/dL — AB (ref 70–99)
Potassium: 4.7 mEq/L (ref 3.5–5.1)
SODIUM: 132 meq/L — AB (ref 135–145)
TOTAL PROTEIN: 7 g/dL (ref 6.0–8.3)

## 2016-07-09 LAB — TSH: TSH: 1.05 u[IU]/mL (ref 0.35–4.50)

## 2016-07-09 LAB — VITAMIN B12: Vitamin B-12: 413 pg/mL (ref 211–911)

## 2016-07-09 LAB — T4, FREE: Free T4: 1.09 ng/dL (ref 0.60–1.60)

## 2016-07-09 NOTE — Assessment & Plan Note (Signed)
Checking B12 levels, on IM replacement at home.

## 2016-07-09 NOTE — Patient Instructions (Addendum)
We will have you increase the valsartan to 2 pills a day. This is a total of 320 mg daily (previously 160 mg daily).   If in 1-2 weeks you are feeling better we will send in a higher strength prescription and you can change. If it does not work go back to taking 1 pill daily of the valsartan.  Health Maintenance, Female Adopting a healthy lifestyle and getting preventive care can go a long way to promote health and wellness. Talk with your health care provider about what schedule of regular examinations is right for you. This is a good chance for you to check in with your provider about disease prevention and staying healthy. In between checkups, there are plenty of things you can do on your own. Experts have done a lot of research about which lifestyle changes and preventive measures are most likely to keep you healthy. Ask your health care provider for more information. WEIGHT AND DIET  Eat a healthy diet  Be sure to include plenty of vegetables, fruits, low-fat dairy products, and lean protein.  Do not eat a lot of foods high in solid fats, added sugars, or salt.  Get regular exercise. This is one of the most important things you can do for your health.  Most adults should exercise for at least 150 minutes each week. The exercise should increase your heart rate and make you sweat (moderate-intensity exercise).  Most adults should also do strengthening exercises at least twice a week. This is in addition to the moderate-intensity exercise.  Maintain a healthy weight  Body mass index (BMI) is a measurement that can be used to identify possible weight problems. It estimates body fat based on height and weight. Your health care provider can help determine your BMI and help you achieve or maintain a healthy weight.  For females 53 years of age and older:   A BMI below 18.5 is considered underweight.  A BMI of 18.5 to 24.9 is normal.  A BMI of 25 to 29.9 is considered overweight.  A BMI  of 30 and above is considered obese.  Watch levels of cholesterol and blood lipids  You should start having your blood tested for lipids and cholesterol at 71 years of age, then have this test every 5 years.  You may need to have your cholesterol levels checked more often if:  Your lipid or cholesterol levels are high.  You are older than 71 years of age.  You are at high risk for heart disease.  CANCER SCREENING   Lung Cancer  Lung cancer screening is recommended for adults 59-53 years old who are at high risk for lung cancer because of a history of smoking.  A yearly low-dose CT scan of the lungs is recommended for people who:  Currently smoke.  Have quit within the past 15 years.  Have at least a 30-pack-year history of smoking. A pack year is smoking an average of one pack of cigarettes a day for 1 year.  Yearly screening should continue until it has been 15 years since you quit.  Yearly screening should stop if you develop a health problem that would prevent you from having lung cancer treatment.  Breast Cancer  Practice breast self-awareness. This means understanding how your breasts normally appear and feel.  It also means doing regular breast self-exams. Let your health care provider know about any changes, no matter how small.  If you are in your 20s or 30s, you should have a  clinical breast exam (CBE) by a health care provider every 1-3 years as part of a regular health exam.  If you are 40 or older, have a CBE every year. Also consider having a breast X-ray (mammogram) every year.  If you have a family history of breast cancer, talk to your health care provider about genetic screening.  If you are at high risk for breast cancer, talk to your health care provider about having an MRI and a mammogram every year.  Breast cancer gene (BRCA) assessment is recommended for women who have family members with BRCA-related cancers. BRCA-related cancers  include:  Breast.  Ovarian.  Tubal.  Peritoneal cancers.  Results of the assessment will determine the need for genetic counseling and BRCA1 and BRCA2 testing. Cervical Cancer Your health care provider may recommend that you be screened regularly for cancer of the pelvic organs (ovaries, uterus, and vagina). This screening involves a pelvic examination, including checking for microscopic changes to the surface of your cervix (Pap test). You may be encouraged to have this screening done every 3 years, beginning at age 21.  For women ages 30-65, health care providers may recommend pelvic exams and Pap testing every 3 years, or they may recommend the Pap and pelvic exam, combined with testing for human papilloma virus (HPV), every 5 years. Some types of HPV increase your risk of cervical cancer. Testing for HPV may also be done on women of any age with unclear Pap test results.  Other health care providers may not recommend any screening for nonpregnant women who are considered low risk for pelvic cancer and who do not have symptoms. Ask your health care provider if a screening pelvic exam is right for you.  If you have had past treatment for cervical cancer or a condition that could lead to cancer, you need Pap tests and screening for cancer for at least 20 years after your treatment. If Pap tests have been discontinued, your risk factors (such as having a new sexual partner) need to be reassessed to determine if screening should resume. Some women have medical problems that increase the chance of getting cervical cancer. In these cases, your health care provider may recommend more frequent screening and Pap tests. Colorectal Cancer  This type of cancer can be detected and often prevented.  Routine colorectal cancer screening usually begins at 71 years of age and continues through 71 years of age.  Your health care provider may recommend screening at an earlier age if you have risk factors for  colon cancer.  Your health care provider may also recommend using home test kits to check for hidden blood in the stool.  A small camera at the end of a tube can be used to examine your colon directly (sigmoidoscopy or colonoscopy). This is done to check for the earliest forms of colorectal cancer.  Routine screening usually begins at age 50.  Direct examination of the colon should be repeated every 5-10 years through 71 years of age. However, you may need to be screened more often if early forms of precancerous polyps or small growths are found. Skin Cancer  Check your skin from head to toe regularly.  Tell your health care provider about any new moles or changes in moles, especially if there is a change in a mole's shape or color.  Also tell your health care provider if you have a mole that is larger than the size of a pencil eraser.  Always use sunscreen. Apply sunscreen liberally   and repeatedly throughout the day.  Protect yourself by wearing long sleeves, pants, a wide-brimmed hat, and sunglasses whenever you are outside. HEART DISEASE, DIABETES, AND HIGH BLOOD PRESSURE   High blood pressure causes heart disease and increases the risk of stroke. High blood pressure is more likely to develop in:  People who have blood pressure in the high end of the normal range (130-139/85-89 mm Hg).  People who are overweight or obese.  People who are African American.  If you are 68-18 years of age, have your blood pressure checked every 3-5 years. If you are 96 years of age or older, have your blood pressure checked every year. You should have your blood pressure measured twice--once when you are at a hospital or clinic, and once when you are not at a hospital or clinic. Record the average of the two measurements. To check your blood pressure when you are not at a hospital or clinic, you can use:  An automated blood pressure machine at a pharmacy.  A home blood pressure monitor.  If you  are between 52 years and 37 years old, ask your health care provider if you should take aspirin to prevent strokes.  Have regular diabetes screenings. This involves taking a blood sample to check your fasting blood sugar level.  If you are at a normal weight and have a low risk for diabetes, have this test once every three years after 71 years of age.  If you are overweight and have a high risk for diabetes, consider being tested at a younger age or more often. PREVENTING INFECTION  Hepatitis B  If you have a higher risk for hepatitis B, you should be screened for this virus. You are considered at high risk for hepatitis B if:  You were born in a country where hepatitis B is common. Ask your health care provider which countries are considered high risk.  Your parents were born in a high-risk country, and you have not been immunized against hepatitis B (hepatitis B vaccine).  You have HIV or AIDS.  You use needles to inject street drugs.  You live with someone who has hepatitis B.  You have had sex with someone who has hepatitis B.  You get hemodialysis treatment.  You take certain medicines for conditions, including cancer, organ transplantation, and autoimmune conditions. Hepatitis C  Blood testing is recommended for:  Everyone born from [birth year] through 1966.  Anyone with known risk factors for hepatitis C. Sexually transmitted infections (STIs)  You should be screened for sexually transmitted infections (STIs) including gonorrhea and chlamydia if:  You are sexually active and are younger than 71 years of age.  You are older than 71 years of age and your health care provider tells you that you are at risk for this type of infection.  Your sexual activity has changed since you were last screened and you are at an increased risk for chlamydia or gonorrhea. Ask your health care provider if you are at risk.  If you do not have HIV, but are at risk, it may be recommended that you  take a prescription medicine daily to prevent HIV infection. This is called pre-exposure prophylaxis (PrEP). You are considered at risk if:  You are sexually active and do not regularly use condoms or know the HIV status of your partner(s).  You take drugs by injection.  You are sexually active with a partner who has HIV. Talk with your health care provider about whether you are  at high risk of being infected with HIV. If you choose to begin PrEP, you should first be tested for HIV. You should then be tested every 3 months for as long as you are taking PrEP.  PREGNANCY   If you are premenopausal and you may become pregnant, ask your health care provider about preconception counseling.  If you may become pregnant, take 400 to 800 micrograms (mcg) of folic acid every day.  If you want to prevent pregnancy, talk to your health care provider about birth control (contraception). OSTEOPOROSIS AND MENOPAUSE   Osteoporosis is a disease in which the bones lose minerals and strength with aging. This can result in serious bone fractures. Your risk for osteoporosis can be identified using a bone density scan.  If you are 56 years of age or older, or if you are at risk for osteoporosis and fractures, ask your health care provider if you should be screened.  Ask your health care provider whether you should take a calcium or vitamin D supplement to lower your risk for osteoporosis.  Menopause may have certain physical symptoms and risks.  Hormone replacement therapy may reduce some of these symptoms and risks. Talk to your health care provider about whether hormone replacement therapy is right for you.  HOME CARE INSTRUCTIONS   Schedule regular health, dental, and eye exams.  Stay current with your immunizations.   Do not use any tobacco products including cigarettes, chewing tobacco, or electronic cigarettes.  If you are pregnant, do not drink alcohol.  If you are breastfeeding, limit how  much and how often you drink alcohol.  Limit alcohol intake to no more than 1 drink per day for nonpregnant women. One drink equals 12 ounces of beer, 5 ounces of wine, or 1 ounces of hard liquor.  Do not use street drugs.  Do not share needles.  Ask your health care provider for help if you need support or information about quitting drugs.  Tell your health care provider if you often feel depressed.  Tell your health care provider if you have ever been abused or do not feel safe at home.   This information is not intended to replace advice given to you by your health care provider. Make sure you discuss any questions you have with your health care provider.   Document Released: 04/29/2011 Document Revised: 11/04/2014 Document Reviewed: 09/15/2013 Elsevier Interactive Patient Education [employer].

## 2016-07-09 NOTE — Assessment & Plan Note (Signed)
Needs more BP control. Increase her valsartan to 320 mg daily. Continue verapamil 240 mg daily.

## 2016-07-09 NOTE — Assessment & Plan Note (Signed)
Fairly stable at this time on her prozac and sleeping medicine.

## 2016-07-09 NOTE — Assessment & Plan Note (Signed)
Checking TSH and adjust as needed her synthroid 125 mcg daily.

## 2016-07-09 NOTE — Progress Notes (Signed)
   Subjective:    Patient ID: Marissa [name], female    DOB: [birth year], 70 y.o.   MRN: [id number]  HPI Here for medicare wellness and CPE, stable complaints. Please see A/P for status and treatment of chronic medical problems.   Diet: heart healthy Physical activity: sedentary Depression/mood screen: negative Hearing: intact to whispered voice, mild loss Visual acuity: grossly normal, performs annual eye exam  ADLs: capable Fall risk: none Home safety: good Cognitive evaluation: intact to orientation, naming, recall and repetition EOL planning: adv directives discussed, in place no prolonged artificial   I have personally reviewed and have noted 1. The patient's medical and social history - reviewed today no changes 2. Their use of alcohol, tobacco or illicit drugs 3. Their current medications and supplements 4. The patient's functional ability including ADL's, fall risks, home safety risks and hearing or visual impairment. 5. Diet and physical activities 6. Evidence for depression or mood disorders 7. Care team reviewed and updated (available in snapshot)  Review of Systems  Constitutional: Positive for fatigue. Negative for activity change, fever and unexpected weight change.  HENT: Negative.   Eyes: Negative.   Respiratory: Negative for cough, chest tightness, shortness of breath and wheezing.   Cardiovascular: Negative for chest pain and leg swelling.  Gastrointestinal: Negative for abdominal distention, abdominal pain, constipation and diarrhea.  Musculoskeletal: Positive for arthralgias, gait problem and myalgias. Negative for back pain.  Skin: Negative.   Neurological: Positive for weakness. Negative for dizziness, light-headedness, numbness and headaches.  Psychiatric/Behavioral: Positive for sleep disturbance. Negative for decreased concentration and dysphoric mood.      Objective:   Physical Exam  Constitutional: She is oriented to person, place, and time. She  appears well-developed and well-nourished.  HENT:  Head: Normocephalic and atraumatic.  Eyes: EOM are normal.  Neck: Normal range of motion.  Cardiovascular: Normal rate and regular rhythm.   Pulmonary/Chest: Effort normal and breath sounds normal. She has no wheezes.  Abdominal: Soft. Bowel sounds are normal. She exhibits no distension. There is no tenderness. There is no rebound.  Neurological: She is alert and oriented to person, place, and time. Coordination normal.  Skin: Skin is warm and dry.  Psychiatric: She has a normal mood and affect.   Vitals:   07/09/16 1313 07/09/16 1440  BP: (!) 190/102 (!) 178/90  Pulse: 72   Resp: 16   Temp: 98.3 F (36.8 C)   TempSrc: Oral   SpO2: 96%   Weight: 140 lb (63.5 kg)   Height: 5' 4.5" (1.638 m)       Assessment & Plan:  High dose flu shot given at visit.

## 2016-07-09 NOTE — Progress Notes (Signed)
Pre visit review using our clinic review tool, if applicable. No additional management support is needed unless otherwise documented below in the visit note. 

## 2016-07-09 NOTE — Assessment & Plan Note (Signed)
BP is high and addressed. Flu shot given at visit. Tdap and pneumonia and shingles are up to date. Mammogram is up to date and colonoscopy up to date. Counseled on sun safety and mole surveillance. Given 10 year screening recommendations.

## 2016-07-11 ENCOUNTER — Other Ambulatory Visit: Payer: Self-pay | Admitting: Internal Medicine

## 2016-07-15 NOTE — Telephone Encounter (Signed)
Sent to pharmacy 

## 2016-07-16 ENCOUNTER — Telehealth: Payer: Self-pay | Admitting: *Deleted

## 2016-07-16 NOTE — Telephone Encounter (Signed)
This was done on 07/15/16.

## 2016-07-16 NOTE — Telephone Encounter (Signed)
Rec'd fax pt requesting refill on Carisoprodol 350 mg. Last filled 05/28/16...Marissa [name]

## 2016-07-24 ENCOUNTER — Telehealth: Payer: Self-pay

## 2016-07-24 NOTE — Telephone Encounter (Signed)
PA initiated via CoverMyMeds key [id number]

## 2016-07-26 NOTE — Telephone Encounter (Signed)
PA DENIED per [facility] due to non FDA approved use (chronic pain) in member 52 years or older

## 2016-07-26 NOTE — Telephone Encounter (Signed)
Okay, she can still buy if she wants.

## 2016-07-29 NOTE — Telephone Encounter (Signed)
Pt's spouse advise in detail

## 2016-07-30 ENCOUNTER — Encounter: Payer: Self-pay | Admitting: Internal Medicine

## 2016-07-30 ENCOUNTER — Other Ambulatory Visit: Payer: Self-pay | Admitting: Internal Medicine

## 2016-07-30 MED ORDER — MEPERIDINE HCL 50 MG PO TABS: 50.0000 mg | ORAL_TABLET | Freq: Four times a day (QID) | ORAL | 0 refills | Status: DC | PRN

## 2016-08-01 MED ORDER — VALSARTAN 160 MG PO TABS: 320.0000 mg | ORAL_TABLET | Freq: Every day | ORAL | 3 refills | Status: DC

## 2016-08-06 ENCOUNTER — Other Ambulatory Visit: Payer: Self-pay | Admitting: Internal Medicine

## 2016-08-07 NOTE — Telephone Encounter (Signed)
Faxed to pharmacy

## 2016-08-09 NOTE — Telephone Encounter (Signed)
Rec'd call pt states pharmacy states they never received script for the sleep medicine. Inform pt will contact [facility] and give authorization from 10/11. Called pharmacy spoke w/pharmacist Marya Amsler gave refill info for Lunesta...Marissa [name]

## 2016-08-11 ENCOUNTER — Encounter: Payer: Self-pay | Admitting: Internal Medicine

## 2016-08-12 MED ORDER — ALBUTEROL SULFATE HFA 108 (90 BASE) MCG/ACT IN AERS: INHALATION_SPRAY | RESPIRATORY_TRACT | 2 refills | Status: DC

## 2016-09-02 ENCOUNTER — Other Ambulatory Visit: Payer: Self-pay | Admitting: Internal Medicine

## 2016-10-30 ENCOUNTER — Other Ambulatory Visit: Payer: Self-pay | Admitting: Internal Medicine

## 2016-10-31 ENCOUNTER — Encounter: Payer: Self-pay | Admitting: Internal Medicine

## 2016-10-31 ENCOUNTER — Other Ambulatory Visit (INDEPENDENT_AMBULATORY_CARE_PROVIDER_SITE_OTHER): Payer: Medicare Other

## 2016-10-31 ENCOUNTER — Ambulatory Visit (INDEPENDENT_AMBULATORY_CARE_PROVIDER_SITE_OTHER): Payer: Medicare Other | Admitting: Internal Medicine

## 2016-10-31 ENCOUNTER — Ambulatory Visit (INDEPENDENT_AMBULATORY_CARE_PROVIDER_SITE_OTHER)
Admission: RE | Admit: 2016-10-31 | Discharge: 2016-10-31 | Disposition: A | Discharge status: Home / Self Care | Payer: Medicare Other | Source: Ambulatory Visit | Attending: Internal Medicine | Admitting: Internal Medicine

## 2016-10-31 VITALS — BP 160/82 | HR 79 | Temp 97.9°F | Ht 64.5 in | Wt 141.2 lb

## 2016-10-31 DIAGNOSIS — R0602 Shortness of breath: Secondary | ICD-10-CM | POA: Diagnosis not present

## 2016-10-31 DIAGNOSIS — J45991 Cough variant asthma: Secondary | ICD-10-CM

## 2016-10-31 DIAGNOSIS — R05 Cough: Secondary | ICD-10-CM | POA: Diagnosis not present

## 2016-10-31 DIAGNOSIS — I10 Essential (primary) hypertension: Secondary | ICD-10-CM

## 2016-10-31 LAB — CBC WITH DIFFERENTIAL/PLATELET
BASOS PCT: 0.7 % (ref 0.0–3.0)
Basophils Absolute: 0 10*3/uL (ref 0.0–0.1)
EOS ABS: 0.2 10*3/uL (ref 0.0–0.7)
EOS PCT: 3.4 % (ref 0.0–5.0)
HCT: 38.7 % (ref 36.0–46.0)
Hemoglobin: 13.5 g/dL (ref 12.0–15.0)
LYMPHS ABS: 1.1 10*3/uL (ref 0.7–4.0)
Lymphocytes Relative: 19.3 % (ref 12.0–46.0)
MCHC: 34.9 g/dL (ref 30.0–36.0)
MCV: 94.3 fl (ref 78.0–100.0)
MONO ABS: 0.8 10*3/uL (ref 0.1–1.0)
Monocytes Relative: 13.6 % — ABNORMAL HIGH (ref 3.0–12.0)
NEUTROS ABS: 3.5 10*3/uL (ref 1.4–7.7)
NEUTROS PCT: 63 % (ref 43.0–77.0)
PLATELETS: 220 10*3/uL (ref 150.0–400.0)
RBC: 4.1 Mil/uL (ref 3.87–5.11)
RDW: 13.7 % (ref 11.5–15.5)
WBC: 5.6 10*3/uL (ref 4.0–10.5)

## 2016-10-31 MED ORDER — AZITHROMYCIN 250 MG PO TABS: ORAL_TABLET | ORAL | 0 refills | Status: DC

## 2016-10-31 MED ORDER — PREDNISONE 10 MG PO TABS: ORAL_TABLET | ORAL | 0 refills | Status: DC

## 2016-10-31 MED ORDER — MEPERIDINE HCL 50 MG PO TABS: 50.0000 mg | ORAL_TABLET | Freq: Four times a day (QID) | ORAL | 0 refills | Status: DC | PRN

## 2016-10-31 NOTE — Progress Notes (Signed)
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 Pulmonary Consult    Self referral. Pt has seen Dr Halford Chessman in the past for PNA.  She c/o "asthma attacks" x 2 wks- prod cough with yellow to white sputum. She also c/o increased SOB.    coughs to point of gagging, min actual mucus, constant sensation of pnds and throat and chest congestion but no better on saba or dulera with sob with min activity  rec Zpak  Change dulera to 100 Take 2 puffs first thing in am and then another 2 puffs about 12 hours later.  Change lisinopril to diovan 160  Only use your albuterol as a rescue medication  Take delsym two tsp every 12 hours and supplement if needed with demerol  50 mg up to 1  every 4 hours to suppress the urge to cough.  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 Follow-up    Pt states that cough and SOB are much improved, but not back to normal baseline. She is using proair on average 3 x per wk.   really Not limited by breathing from desired activities   Not using any cough suppression any more  rec Try symbicort 80 Take 2 puffs first thing in am and then another 2 puffs about 12 hours later F/u prn     10/31/2016 acute  extended ov/Marissa [name] re:  Recurrent cough off symbicort x years Chief Complaint  Patient presents with  . Acute Visit    Pt c/o cough since Oct 2017. She has noticed SOB for the past few days.  Her cough has worsened recently and she is cometimes coughing up thick, clear sputum.     in Sept of 2017 no maint (and hadn't been for years) on maint singulair/ saba prn very rarely  On arrival to [place] by plane acute onset cough white thick mucus  some increased need for albuterol and started to feel better a week later when arrived back to [place]/ cough /sob seemed worse outdoors and persisted and so self referred back to pulmonary / only a little better p saba  Cough seems worse in late afternoon and after supper time but better hs  / sleeps ok But then more more congested in am esp nasal with some  Discolored am mucus but nothing really purulent    Ever since feels fell 01/31/2016 >fx L sided ribs more sob than usual and still some L cw pain worse with cough still needing meperidine prescribed by Dr [name]     No obvious day to day or daytime  variabilty or assoc excess or purulent mucus or  cp or chest tightness, subjective wheeze overt sinus or hb symptoms. No unusual exp hx or h/o childhood pna/ asthma or knowledge of premature birth.    Also denies any obvious fluctuation of symptoms with weather or environmental changes or other aggravating or alleviating factors except as outlined above   Current Medications, Allergies, Complete Past Medical History, Past Surgical History, Family History, and Social History were reviewed in [employer] record.  ROS  The following are not active complaints unless bolded sore throat, dysphagia, dental problems, itching, sneezing,  nasal congestion or excess/ purulent secretions, ear ache,   fever, chills, sweats, unintended wt loss, pleuritic or exertional cp, hemoptysis,  orthopnea pnd or leg swelling, presyncope, palpitations, heartburn,  abdominal pain, anorexia, nausea, vomiting, diarrhea  or change in bowel or urinary habits, change in stools or urine, dysuria,hematuria,  rash, arthralgias, visual complaints, headache, numbness weakness or ataxia or problems with walking or coordination,  change in mood/affect or memory.                 .       Objective:   Physical Exam  Wt Readings from Last 3 Encounters:  10/31/16 141 lb 3.2 oz (64 kg)  07/09/16 140 lb (63.5 kg)  03/28/16 136 lb (61.7 kg)    Vital signs reviewed - note bp up slightly    amb wf with  Harsh barking quality upper airway cough with pseudowheeze   HEENT: nl dentition, turbinates, and orophanx. Nl external ear canals without cough reflex   NECK :  without JVD/Nodes/TM/ nl carotid upstrokes bilaterally   LUNGS: no acc muscle use, clear to A and P bilaterally without cough on insp or exp maneuvers   CV:  RRR  no s3 or murmur or increase in P2, no edema   ABD:  soft and nontender with nl excursion in the supine position. No bruits or organomegaly, bowel sounds nl  MS:  warm without deformities, calf tenderness, cyanosis or clubbing         CXR PA and Lateral:   10/31/2016 :    I personally reviewed images and agree with radiology impression as follows:   Mild hyperinflation consistent with known asthma. No acute pneumonia or CHF. Previous left lateral seventh and eighth rib fractures with overlying soft tissue thickening which is more prominent than on the previous study.   Labs ordered 10/31/2016  Allergy profile        Assessment & Plan:

## 2016-10-31 NOTE — Progress Notes (Signed)
Spoke with pt and notified of results per Dr. Wert. Pt verbalized understanding and denied any questions. 

## 2016-10-31 NOTE — Patient Instructions (Addendum)
Whenever start cough > Try prilosec otc 35m  Take 30-60 min before first meal of the day and Pepcid ac (famotidine)  20 mg one after supper  until cough is completely gone for at least a week without the need for cough suppression  Prednisone 10 mg take  4 each am x 2 days,   2 each am x 2 days,  1 each am x 2 days and stop   zpak   Please remember to go to the lab and x-ray department downstairs for your tests - we will call you with the results when they are available.   Take delsym two tsp every 12 hours and supplement if needed with  Demerol 50 mg up to 1 every 4 hours to suppress the urge to cough. Swallowing water or using ice chips/non mint and menthol containing candies (such as lifesavers or sugarless jolly ranchers) are also effective.  You should rest your voice and avoid activities that you know make you cough.  Once you have eliminated the cough for 3 straight days try reducing the  Demerol first,  then the delsym as tolerated.   Please schedule a follow up office visit in 2  weeks, sooner if needed

## 2016-11-01 LAB — RESPIRATORY ALLERGY PROFILE REGION II ~~LOC~~
Allergen, Cedar tree, t12: <0.1 kU/L
Allergen, Cottonwood, t14: <0.1 kU/L
Allergen, D pternoyssinus,d7: <0.1 kU/L
Allergen, Mouse Urine Protein, e78: <0.1 kU/L
Allergen, Mulberry, t76: <0.1 kU/L
Allergen, Oak,t7: <0.1 kU/L
Cat Dander: <0.1 kU/L
Cockroach: <0.1 kU/L
Dog Dander: <0.1 kU/L
Elm IgE: <0.1 kU/L
IGE (IMMUNOGLOBULIN E), SERUM: 9 kU/L (ref <115)
Pecan/Hickory Tree IgE: <0.1 kU/L
Rough Pigweed  IgE: <0.1 kU/L
Timothy Grass: <0.1 kU/L

## 2016-11-01 MED ORDER — VERAPAMIL HCL ER 240 MG PO TBCR: 240.0000 mg | EXTENDED_RELEASE_TABLET | Freq: Every day | ORAL | 2 refills | Status: DC

## 2016-11-01 MED ORDER — MONTELUKAST SODIUM 10 MG PO TABS: 10.0000 mg | ORAL_TABLET | Freq: Every day | ORAL | 2 refills | Status: DC

## 2016-11-02 ENCOUNTER — Encounter: Payer: Self-pay | Admitting: Internal Medicine

## 2016-11-02 NOTE — Assessment & Plan Note (Addendum)
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   These are the same pts (now being labeled as having "irritable larynx syndrome" by some cough centers) who not infrequently have a history of having failed to tolerate ace inhibitors,  dry powder inhalers or biphosphonates or report having atypical/extraesophageal reflux symptoms that don't respond to standard doses of PPI  and are easily confused as having aecopd or asthma flares by even experienced allergists/ pulmonologists (myself included).    Of the three most common causes of chronic cough, only one (GERD)  can actually cause the other two (asthma and post nasal drip syndrome)  and perpetuate the cylce of cough inducing airway trauma, inflammation, heightened sensitivity to reflux which is prompted by the cough itself via a cyclical mechanism.    This may partially respond to steroids and look like asthma and post nasal drainage but never erradicated completely unless the cough and the secondary reflux are eliminated, preferably both at the same time.  While not intuitively obvious, many patients with chronic low grade reflux do not cough until there is a secondary insult that disturbs the protective epithelial barrier and exposes sensitive nerve endings.  This can be viral  (as is likely the case here) or direct physical injury such as with an endotracheal tube.   The point is that once this occurs, it is difficult to eliminate using anything but a maximally effective acid suppression regimen at least in the short run, accompanied by an appropriate diet to address non acid GERD.   rec max rx for GERD/ eliminate cyclical coughing ad add a zpak just in case there is active bacterial component (though strongly doubt as on doxy for rosacea) and only short course prednisone in case there's any inflammatory/ asthmatic component which I also doubt.    I had an extended discussion with the patient reviewing all relevant studies completed to date and  lasting 25 minutes of a 40  minute acute office visit in pt not seen > 2 years   re  Refractory non-specific but potentially very serious pulmonary symptoms of unknown etiology.  Each maintenance medication was reviewed in detail including most importantly the difference between maintenance and prns and under what circumstances the prns are to be triggered using an action plan format that is not reflected in the computer generated alphabetically organized AVS.    Please see AVS for specific instructions unique to this office visit that I personally wrote and verbalized to the the pt in detail and then reviewed with pt  by my nurse highlighting any  changes in therapy recommended at today's visit to their plan of care.

## 2016-11-02 NOTE — Assessment & Plan Note (Signed)
Note up slightly on diovan/calan> rec avoid salt, decongestants and  Follow up per Primary Care

## 2016-11-04 NOTE — Progress Notes (Signed)
LMTCB on home and cell

## 2016-11-04 NOTE — Progress Notes (Signed)
Patient returned call.  Advised of lab results / recs as stated by Dr Melvyn Novas.  Pt verbalized understanding and denied any questions.

## 2016-11-18 ENCOUNTER — Ambulatory Visit (INDEPENDENT_AMBULATORY_CARE_PROVIDER_SITE_OTHER): Payer: Medicare Other | Admitting: Internal Medicine

## 2016-11-18 ENCOUNTER — Encounter: Payer: Self-pay | Admitting: Internal Medicine

## 2016-11-18 VITALS — BP 126/80 | HR 77 | Ht 64.5 in | Wt 144.0 lb

## 2016-11-18 DIAGNOSIS — J45991 Cough variant asthma: Secondary | ICD-10-CM

## 2016-11-18 MED ORDER — BUDESONIDE-FORMOTEROL FUMARATE 80-4.5 MCG/ACT IN AERO: 2.0000 | INHALATION_SPRAY | Freq: Two times a day (BID) | RESPIRATORY_TRACT | 0 refills | Status: DC

## 2016-11-18 MED ORDER — BUDESONIDE-FORMOTEROL FUMARATE 80-4.5 MCG/ACT IN AERO: INHALATION_SPRAY | RESPIRATORY_TRACT | Status: DC

## 2016-11-18 MED ORDER — FLUTTER DEVI: 0 refills | Status: DC

## 2016-11-18 NOTE — Progress Notes (Signed)
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 Pulmonary Consult    Self referral. Pt has seen Dr Halford Chessman in the past for PNA.  She c/o "asthma attacks" x 2 wks- prod cough with yellow to white sputum. She also c/o increased SOB.    coughs to point of gagging, min actual mucus, constant sensation of pnds and throat and chest congestion but no better on saba or dulera with sob with min activity  rec Zpak  Change dulera to 100 Take 2 puffs first thing in am and then another 2 puffs about 12 hours later.  Change lisinopril to diovan 160  Only use your albuterol as a rescue medication  Take delsym two tsp every 12 hours and supplement if needed with demerol  50 mg up to 1  every 4 hours to suppress the urge to cough.  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 Follow-up    Pt states that cough and SOB are much improved, but not back to normal baseline. She is using proair on average 3 x per wk.   really Not limited by breathing from desired activities   Not using any cough suppression any more  rec Try symbicort 80 Take 2 puffs first thing in am and then another 2 puffs about 12 hours later F/u prn     10/31/2016 acute  extended ov/Hollie Bartus re:  Recurrent cough off symbicort x years Chief Complaint  Patient presents with  . Acute Visit    Pt c/o cough since Oct 2017. She has noticed SOB for the past few days.  Her cough has worsened recently and she is cometimes coughing up thick, clear sputum.     in Sept of 2017 no maint (and hadn't been for years) on maint singulair/ saba prn very rarely  On arrival to [place] by plane Aug 16 2016 acute onset cough white thick mucus  some increased need for albuterol and started to feel better a week later when arrived back to [place]/ cough /sob seemed worse outdoors and persisted and so self referred back to pulmonary / only a little better p saba  Cough seems worse in late afternoon and after supper time but better hs  / sleeps ok But then more more congested in am esp nasal with some  Discolored am mucus but nothing really purulent  rec Whenever start cough > Try prilosec otc 52m  Take 30-60 min before first meal of the day and Pepcid ac (famotidine)  20 mg one after supper  until cough is completely gone for at least a  week without the need for cough suppression Prednisone 10 mg take  4 each am x 2 days,   2 each am x 2 days,  1 each am x 2 days and stop  zpak   Take delsym two tsp every 12 hours and supplement if needed with  Demerol 50 mg up to 1 every 4 hours to suppress the urge to cough.  Once you have eliminated the cough for 3 straight days try reducing the  Demerol first,  then the delsym as tolerated    11/18/2016  f/u ov/Yaresly Menzel re:  Coughing since sept 2017 / did not follow cyclical cough instructions/ maint rx singulair Chief Complaint  Patient presents with  . Follow-up    Pt states "cough is still very croupy and tight"- has not had a day without cough yet.  She does state she is coughing less, but when she does it is still violent.  Her breathing is unchanged. She is using proair 2-3 x per day.   while on zpak developed copious production of white mucus / some  better on prn saba   Only  Used 2 demerol per day max and still hacking cough day > noct on gabapentin 200 mg qhs   No obvious day to day or daytime variabilty or assoc purulent mucus or  cp or chest tightness, subjective wheeze overt sinus or hb symptoms. No unusual exp hx or h/o childhood pna/ asthma or knowledge of premature birth.    Also denies any obvious fluctuation of symptoms with weather or environmental changes or other aggravating or alleviating factors except as outlined above   Current Medications, Allergies, Complete Past Medical History, Past Surgical History, Family History, and Social History were reviewed in [employer] record.  ROS  The following are not active complaints unless bolded sore throat, dysphagia, dental problems, itching, sneezing,  nasal congestion or excess secretions, ear ache,   fever, chills, sweats, unintended wt loss, pleuritic or exertional cp, hemoptysis,  orthopnea pnd or leg swelling, presyncope, palpitations, heartburn, abdominal pain, anorexia, nausea, vomiting, diarrhea  or change in bowel or urinary habits, change in stools or urine, dysuria,hematuria,  rash, arthralgias, visual complaints, headache, numbness weakness or ataxia or problems with walking or coordination,  change in mood/affect or memory.          Objective:   Physical Exam   11/18/2016       144  10/31/16 141 lb 3.2 oz (64 kg)  07/09/16 140 lb (63.5 kg)  03/28/16 136 lb (61.7 kg)    Vital signs reviewed -  - Note on arrival 02 sats  98% on RA      amb wf  nad   HEENT: nl dentition, turbinates, and orophanx. Nl external ear canals without cough reflex - moderate bilateral non-specific turbinate edema    NECK :  without JVD/Nodes/TM/ nl carotid upstrokes bilaterally   LUNGS: no acc muscle use, insp and exp rhonchi bilaterally with cough more on insp than exp   CV:  RRR  no s3 or murmur or increase in P2, no edema   ABD:  soft and nontender with  nl excursion in the supine position. No bruits or organomegaly, bowel sounds nl  MS:  warm without deformities, calf tenderness, cyanosis or clubbing         CXR PA and Lateral:   10/31/2016 :    I personally reviewed images and agree with radiology impression as follows:   Mild hyperinflation consistent with  known asthma. No acute pneumonia or CHF. Previous left lateral seventh and eighth rib fractures with overlying soft tissue thickening which is more prominent than on the previous study.          Assessment & Plan:

## 2016-11-18 NOTE — Patient Instructions (Addendum)
Please see patient coordinator before you leave today  to schedule sinus CT   Try gabapentin 100 mg one  with bfast, lunch and supper then 2 at bedtime  Plan A = Automatic =  symbicort 80 Take 2 puffs first thing in am and then another 2 puffs about 12 hours later                                      Continue Try prilosec otc 60m  Take 30-60 min before first meal of the day and Pepcid ac (famotidine) 20 mg one @  bedtime  Until return      Work on inhaler technique:  relax and gently blow all the way out then take a nice smooth deep breath back in, triggering the inhaler at same time you start breathing in.  Hold for up to 5 seconds if you can. Blow out thru nose. Rinse and gargle with water when done   Plan B = Backup Only use your albuterol (ventolin) as a rescue medication to be used if you can't catch your breath by resting or doing a relaxed purse lip breathing pattern.  - The less you use it, the better it will work when you need it. - Ok to use the inhaler up to 2 puffs  every 4 hours if you must but call for appointment if use goes up over your usual need - Don't leave home without it !!  (think of it like the spare tire for your car)    For cough > use the flutter valve and Take delsym two tsp every 12 hours and supplement if needed with demerol 50 mg up to 2 every 4 hours to suppress the urge to cough. Swallowing water or using ice chips/non mint and menthol containing candies (such as lifesavers or sugarless jolly ranchers) are also effective.  You should rest your voice and avoid activities that you know make you cough.  Once you have eliminated the cough for 3 straight days try reducing the demerol  first,  then the delsym as tolerated.    Please schedule a follow up office visit in 2 weeks, sooner if needed  - bring all meds in two bags - one is maintenance and the other is as needed

## 2016-11-21 ENCOUNTER — Encounter: Payer: Self-pay | Admitting: Internal Medicine

## 2016-11-22 MED ORDER — BUDESONIDE-FORMOTEROL FUMARATE 80-4.5 MCG/ACT IN AERO: 1.0000 | INHALATION_SPRAY | Freq: Two times a day (BID) | RESPIRATORY_TRACT | 5 refills | Status: DC

## 2016-11-22 NOTE — Telephone Encounter (Signed)
Spoke with pt over the phone. She is aware of MW's recommendations. Pt already had an appointment scheduled for 12/03/2016. Nothing further was needed.

## 2016-11-22 NOTE — Telephone Encounter (Signed)
MW  Please Advise-  Please see pt email   

## 2016-11-22 NOTE — Telephone Encounter (Signed)
rec try symb one bid and return w/in 2 weeks with all meds in hand to regroup

## 2016-11-24 ENCOUNTER — Encounter: Payer: Self-pay | Admitting: Internal Medicine

## 2016-11-24 NOTE — Assessment & Plan Note (Addendum)
Onset sept 2017 - 08/16/2014 try change to symbicort 80 2bid for insurance  - Allergy profile 11/01/15  >  Eos 0.2 /  IgE  9, neg rast  - sinus ct 11/24/2016   - 11/18/2016  After extensive coaching HFA effectiveness =    75%  Exam [place] c/w component of asthma but the upper airway component today is striking.  Lack of cough resolution on a verified empirical regimen could mean an alternative diagnoses - many chronic coughs have more than one mech -(eg irritable larynx so rec trial of gabapentin during the day when cough is worse), persistence of the disease state (eg sinusitis- sinus st ordered -  or bronchiectasis) , or inadequacy of currently available therapy (eg no medical rx available for non-acid gerd and aggravated by cyclical coughing  Of the three most common causes of chronic cough, only one (GERD)  can actually cause the other two (asthma and post nasal drip syndrome)  and perpetuate the cylce of cough inducing airway trauma, inflammation, heightened sensitivity to reflux which is prompted by the cough itself via a cyclical mechanism.    This may partially respond to steroids and look like asthma and post nasal drainage but never erradicated completely unless the cough and the secondary reflux are eliminated, preferably both at the same time.  While not intuitively obvious, many patients with chronic low grade reflux do not cough until there is a secondary insult that disturbs the protective epithelial barrier and exposes sensitive nerve endings.  This can be viral or direct physical injury such as with an endotracheal tube.   The point is that once this occurs, it is difficult to eliminate using anything but a maximally effective acid suppression regimen at least in the short run, accompanied by an appropriate diet to address non acid GERD.   I had an extended discussion with the patient reviewing all relevant studies completed to date and  lasting 25 minutes of a 40  minute office  visit re  refractory severe  non-specific but potentially very serious pulmonary symptoms of unknown etiology.  Each maintenance medication was reviewed in detail including most importantly the difference between maintenance and prns and under what circumstances the prns are to be triggered using an action plan format that is not reflected in the computer generated alphabetically organized AVS.    Please see AVS for specific instructions unique to this office visit that I personally wrote and verbalized to the the pt in detail and then reviewed with pt  by my nurse highlighting any  changes in therapy recommended at today's visit to their plan of care.    Plan is to f/u in 2 weeks  with all meds in hand using a trust but verify approach to confirm accurate Medication  Reconciliation The principal here is that until we are certain that the  patients are doing what we've asked, it makes no sense to ask them to do more.

## 2016-11-25 ENCOUNTER — Ambulatory Visit (INDEPENDENT_AMBULATORY_CARE_PROVIDER_SITE_OTHER)
Admission: RE | Admit: 2016-11-25 | Discharge: 2016-11-25 | Disposition: A | Discharge status: Home / Self Care | Payer: Medicare Other | Source: Ambulatory Visit | Attending: Internal Medicine | Admitting: Internal Medicine

## 2016-11-25 ENCOUNTER — Telehealth: Payer: Self-pay | Admitting: Internal Medicine

## 2016-11-25 DIAGNOSIS — J3489 Other specified disorders of nose and nasal sinuses: Secondary | ICD-10-CM | POA: Diagnosis not present

## 2016-11-25 DIAGNOSIS — J45991 Cough variant asthma: Secondary | ICD-10-CM

## 2016-11-25 NOTE — Progress Notes (Signed)
LMTCB

## 2016-11-25 NOTE — Telephone Encounter (Signed)
Pt. Was returning [place] call. Pt. Was given her CT scan results per MW. She is ok with her current appointment. Nothing further is needed at this time..   Notes Recorded by Tanda Rockers, MD on 11/25/2016 at 10:03 AM EST Call patient : Study is unremarkable, no evidence of active or acute sinusitis - Be sure patient has f/u ov so we can go over all the details of this study and get a plan together moving forward - ok to move up f/u if not feeling better and wants to be seen sooner

## 2016-12-03 ENCOUNTER — Encounter: Payer: Self-pay | Admitting: Internal Medicine

## 2016-12-03 ENCOUNTER — Ambulatory Visit (INDEPENDENT_AMBULATORY_CARE_PROVIDER_SITE_OTHER): Payer: Medicare Other | Admitting: Internal Medicine

## 2016-12-03 VITALS — BP 112/66 | HR 68 | Ht 64.5 in | Wt 142.0 lb

## 2016-12-03 DIAGNOSIS — J45991 Cough variant asthma: Secondary | ICD-10-CM

## 2016-12-03 NOTE — Patient Instructions (Addendum)
Stop symbicort completely one week before your methacholine challenge but this is the only change   Please see patient coordinator before you leave today  to schedule methacholine challenge testing more than a week from today   If negative you do not have asthma and will need to be referred to [facility]/ voice center Dr Joya Gaskins - if positive will need to return here.

## 2016-12-03 NOTE — Progress Notes (Signed)
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 Pulmonary Consult    Self referral. Pt has seen Dr Marissa [name] in the past for PNA.  She c/o "asthma attacks" x 2 wks- prod cough with yellow to white sputum. She also c/o increased SOB.    coughs to point of gagging, min actual mucus, constant sensation of pnds and throat and chest congestion but no better on saba or dulera with sob with min activity  rec Zpak  Change dulera to 100 Take 2 puffs first thing in am and then another 2 puffs about 12 hours later.  Change lisinopril to diovan 160  Only use your albuterol as a rescue medication  Take delsym two tsp every 12 hours and supplement if needed with demerol  50 mg up to 1  every 4 hours to suppress the urge to cough.  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 Follow-up    Pt states that cough and SOB are much improved, but not back to normal baseline. She is using proair on average 3 x per wk.   really Not limited by breathing from desired activities   Not using any cough suppression any more  rec Try symbicort 80 Take 2 puffs first thing in am and then another 2 puffs about 12 hours later F/u prn     10/31/2016 acute  extended ov/Marissa [name] re:  Recurrent cough off symbicort x years Chief Complaint  Patient presents with  . Acute Visit    Pt c/o cough since Oct 2017. She has noticed SOB for the past few days.  Her cough has worsened recently and she is cometimes coughing up thick, clear sputum.     in Sept of 2017 no maint (and hadn't been for years) on maint singulair/ saba prn very rarely  On arrival to [place] by plane Aug 16 2016 acute onset cough white thick mucus  some increased need for albuterol and started to feel better a week later when arrived back to [place]/ cough /sob seemed worse outdoors and persisted and so self referred back to pulmonary / only a little better p saba  Cough seems worse in late afternoon and after supper time but better hs  / sleeps ok But then more more congested in am esp nasal with some  Discolored am mucus but nothing really purulent  rec Whenever start cough > Try prilosec otc 3m  Take 30-60 min before first meal of the day and Pepcid ac (famotidine)  20 mg one after supper  until cough is completely gone for at least a  week without the need for cough suppression Prednisone 10 mg take  4 each am x 2 days,   2 each am x 2 days,  1 each am x 2 days and stop  zpak   Take delsym two tsp every 12 hours and supplement if needed with  Demerol 50 mg up to 1 every 4 hours to suppress the urge to cough.  Once you have eliminated the cough for 3 straight days try reducing the  Demerol first,  then the delsym as tolerated    11/18/2016  f/u ov/Marissa [name] re:  Coughing since sept 2017 / did not follow cyclical cough instructions/ maint rx singulair Chief Complaint  Patient presents with  . Follow-up    Pt states "cough is still very croupy and tight"- has not had a day without cough yet.  She does state she is coughing less, but when she does it is still violent.  Her breathing is unchanged. She is using proair 2-3 x per day.   while on zpak developed copious production of white mucus / some  better on prn saba  Used 2 demerol per day max and still hacking cough day > noct on gabapentin 200 mg qhs  rec Please see patient coordinator before you leave today  to schedule sinus CT  try gabapentin 100 mg one  with bfast, lunch and supper then 2 at bedtime Plan A = Automatic =  symbicort 80 Take 2 puffs first thing in am and then another 2 puffs about 12 hours later                                      Continue Try prilosec otc 67m  Take 30-60 min before first meal of the day and Pepcid ac (famotidine) 20 mg one @  bedtime  Until return     Work on inhaler technique:  relax and gently blow all the way out then take a nice smooth deep breath back in, triggering the inhaler at same time you start breathing in.  Hold for up to 5 seconds if you can. Blow out thru nose. Rinse and gargle with water when done Plan B = Backup Only use your albuterol (ventolin) as a rescue medication to be used if you can't catch your breath by resting or doing a relaxed purse lip breathing pattern.  - The less you use it, the better it will work when you need it. - Ok to use the inhaler up to 2 puffs  every 4 hours if you must but call for appointment if use goes up over your usual need - Don't leave home without it !!  (think of it like the spare tire for your car)  For cough > use the flutter valve and Take delsym two tsp every 12 hours and supplement if needed with demerol 50 mg up to 2 every 4 hours to suppress the urge to cough. Swallowing water or using ice chips/non mint and menthol containing candies (such as lifesavers or sugarless jolly ranchers) are also effective.  You should rest your voice and avoid activities that you know make you cough. Once you have eliminated the cough for 3 straight days try reducing the demerol  first,  then the delsym as tolerated.   Please schedule a follow up office visit in 2 weeks, sooner if needed  - bring all meds in two bags - one is  maintenance and the other is as needed       12/03/2016  f/u ov/Marissa [name] re: coughing since Sept 2017 on symb 80/ singulair and not able to take gabapentin due to cns effects  - not clear at all she follwed instructions/ did not bring all meds as req/ says nothing made any difference so far to the hx as above/ cough is mostly day > noct/ dry > wet   No obvious day to day or daytime variabilty or assoc purulent mucus or  cp or chest tightness, subjective wheeze overt sinus or hb symptoms. No unusual exp hx or h/o childhood pna/ asthma or knowledge of premature birth.    Also denies any obvious fluctuation of symptoms with weather or environmental changes or other aggravating or alleviating factors except as outlined above   Current Medications, Allergies, Complete Past Medical History, Past Surgical History, Family History, and Social History were reviewed in [employer] record.  ROS  The following are not active complaints unless bolded sore throat, dysphagia, dental problems, itching, sneezing,  nasal congestion or excess secretions, ear ache,   fever, chills, sweats, unintended wt loss, pleuritic or exertional cp, hemoptysis,  orthopnea pnd or leg swelling, presyncope, palpitations, heartburn, abdominal pain, anorexia, nausea, vomiting, diarrhea  or change in bowel or urinary habits, change in stools or urine, dysuria,hematuria,  rash, arthralgias, visual complaints, headache, numbness weakness or ataxia or problems with walking or coordination,  change in mood/affect or memory.          Objective:   Physical Exam   12/03/2016         142   11/18/2016       144  10/31/16 141 lb 3.2 oz (64 kg)  07/09/16 140 lb (63.5 kg)  03/28/16 136 lb (61.7 kg)    Vital signs reviewed -  - Note on arrival 02 sats  98% on RA     amb wf  nad freq throat clearing with no candy handy  HEENT: nl dentition, turbinates, and orophanx. Nl external ear canals without cough reflex - moderate bilateral non-specific turbinate edema     NECK :  without JVD/Nodes/TM/ nl carotid upstrokes bilaterally   LUNGS: no acc muscle use, completely clear to A and P with no cough on insp or exp    CV:  RRR  no s3 or murmur or increase in P2, no edema   ABD:  soft and nontender with nl excursion in the supine position. No bruits or organomegaly, bowel sounds nl  MS:  warm without deformities, calf tenderness, cyanosis or clubbing         CXR PA and Lateral:   10/31/2016 :    I personally reviewed images and agree with radiology impression as follows:   Mild hyperinflation consistent with known asthma. No acute pneumonia or CHF. Previous left lateral seventh and eighth rib fractures with overlying soft tissue thickening which is more prominent than on the previous study.          Assessment & Plan:

## 2016-12-04 NOTE — Assessment & Plan Note (Signed)
Onset sept 2017 - 08/16/2014 try change to symbicort 80 2bid for insurance  - Allergy profile 11/01/15  >  Eos 0.2 /  IgE  9, neg rast  - sinus ct 11/25/2016 Mucosal thickening in the left frontal and maxillary sinus. No evidence of advanced inflammatory change. No free fluid.  - 11/18/2016  After extensive coaching HFA effectiveness =    75% - Methacholine choline challenge off symbicort 80   Lack of cough resolution on a verified empirical regimen (not really clear she followed all the prev instructions to the letter)  could mean an alternative diagnosis (irritable larynx), persistence of the disease state (eg sinusitis- excluded by sinus ct  or bronchiectasis) , or inadequacy of currently available therapy (eg no medical rx available for non-acid gerd which is the result of the cough in a cyclical fashion)  Next logical step is MCT off symbicort and return here if pos, to [facility] voice center if neg   I had an extended discussion with the patient reviewing all relevant studies completed to date and  lasting 15 to 20 minutes of a 25 minute visit    Each maintenance medication was reviewed in detail including most importantly the difference between maintenance and prns and under what circumstances the prns are to be triggered using an action plan format that is not reflected in the computer generated alphabetically organized AVS.    Please see AVS for specific instructions unique to this visit that I personally wrote and verbalized to the the pt in detail and then reviewed with pt  by my nurse highlighting any  changes in therapy recommended at today's visit to their plan of care.

## 2016-12-06 ENCOUNTER — Other Ambulatory Visit: Payer: Self-pay | Admitting: Internal Medicine

## 2016-12-17 ENCOUNTER — Ambulatory Visit (HOSPITAL_COMMUNITY)
Admission: RE | Admit: 2016-12-17 | Discharge: 2016-12-17 | Disposition: A | Discharge status: Home / Self Care | Payer: Medicare Other | Source: Ambulatory Visit | Attending: Internal Medicine | Admitting: Internal Medicine

## 2016-12-17 DIAGNOSIS — J45991 Cough variant asthma: Secondary | ICD-10-CM

## 2016-12-17 LAB — PULMONARY FUNCTION TEST
FEF 25-75 PRE: 2.08 L/s
FEF 25-75 Post: 3.21 L/sec
FEF2575-%Change-Post: 54 %
FEF2575-%Pred-Post: 178 %
FEF2575-%Pred-Pre: 115 %
FEV1-%CHANGE-POST: 9 %
FEV1-%PRED-POST: 114 %
FEV1-%PRED-PRE: 104 %
FEV1-POST: 2.52 L
FEV1-PRE: 2.3 L
FEV1FVC-%Change-Post: 8 %
FEV1FVC-%PRED-PRE: 102 %
FEV6-%CHANGE-POST: 0 %
FEV6-%PRED-POST: 105 %
FEV6-%Pred-Pre: 105 %
FEV6-POST: 2.95 L
FEV6-PRE: 2.96 L
FEV6FVC-%CHANGE-POST: 0 %
FEV6FVC-%PRED-POST: 104 %
FEV6FVC-%PRED-PRE: 103 %
FVC-%Change-Post: 1 %
FVC-%PRED-POST: 102 %
FVC-%Pred-Pre: 101 %
FVC-Post: 2.99 L
FVC-Pre: 2.96 L
POST FEV1/FVC RATIO: 84 %
PRE FEV6/FVC RATIO: 100 %
Post FEV6/FVC ratio: 100 %
Pre FEV1/FVC ratio: 78 %

## 2016-12-17 MED ORDER — METHACHOLINE 0.0625 MG/ML NEB SOLN
2.0000 mL | Freq: Once | RESPIRATORY_TRACT | Status: AC
  Administered 2016-12-17: 0.125 mg via RESPIRATORY_TRACT

## 2016-12-17 MED ORDER — METHACHOLINE 1 MG/ML NEB SOLN
2.0000 mL | Freq: Once | RESPIRATORY_TRACT | Status: AC
  Administered 2016-12-17: 2 mg via RESPIRATORY_TRACT

## 2016-12-17 MED ORDER — ALBUTEROL SULFATE (2.5 MG/3ML) 0.083% IN NEBU
2.5000 mg | INHALATION_SOLUTION | Freq: Once | RESPIRATORY_TRACT | Status: AC
  Administered 2016-12-17: 2.5 mg via RESPIRATORY_TRACT

## 2016-12-17 MED ORDER — METHACHOLINE 0.25 MG/ML NEB SOLN
2.0000 mL | Freq: Once | RESPIRATORY_TRACT | Status: AC
  Administered 2016-12-17: 0.5 mg via RESPIRATORY_TRACT

## 2016-12-17 MED ORDER — METHACHOLINE 16 MG/ML NEB SOLN
2.0000 mL | Freq: Once | RESPIRATORY_TRACT | Status: AC
  Administered 2016-12-17: 32 mg via RESPIRATORY_TRACT

## 2016-12-17 MED ORDER — METHACHOLINE 4 MG/ML NEB SOLN
2.0000 mL | Freq: Once | RESPIRATORY_TRACT | Status: AC
  Administered 2016-12-17: 8 mg via RESPIRATORY_TRACT

## 2016-12-17 MED ORDER — SODIUM CHLORIDE 0.9 % IN NEBU
3.0000 mL | INHALATION_SOLUTION | Freq: Once | RESPIRATORY_TRACT | Status: AC
  Administered 2016-12-17: 3 mL via RESPIRATORY_TRACT

## 2016-12-18 NOTE — Progress Notes (Signed)
Spoke with pt and notified of results per Dr. Wert. Pt verbalized understanding and denied any questions. 

## 2016-12-26 DIAGNOSIS — H04123 Dry eye syndrome of bilateral lacrimal glands: Secondary | ICD-10-CM | POA: Diagnosis not present

## 2016-12-26 DIAGNOSIS — H35363 Drusen (degenerative) of macula, bilateral: Secondary | ICD-10-CM | POA: Diagnosis not present

## 2016-12-26 DIAGNOSIS — H2513 Age-related nuclear cataract, bilateral: Secondary | ICD-10-CM | POA: Diagnosis not present

## 2016-12-26 DIAGNOSIS — H25013 Cortical age-related cataract, bilateral: Secondary | ICD-10-CM | POA: Diagnosis not present

## 2017-01-04 ENCOUNTER — Other Ambulatory Visit: Payer: Self-pay | Admitting: Internal Medicine

## 2017-01-06 ENCOUNTER — Encounter: Payer: Self-pay | Admitting: Internal Medicine

## 2017-01-06 ENCOUNTER — Ambulatory Visit (INDEPENDENT_AMBULATORY_CARE_PROVIDER_SITE_OTHER): Payer: Medicare Other | Admitting: Internal Medicine

## 2017-01-06 VITALS — BP 122/68 | HR 60 | Ht 64.5 in | Wt 142.2 lb

## 2017-01-06 DIAGNOSIS — J45991 Cough variant asthma: Secondary | ICD-10-CM

## 2017-01-06 MED ORDER — GABAPENTIN 100 MG PO CAPS: ORAL_CAPSULE | ORAL | 11 refills | Status: DC

## 2017-01-06 NOTE — Patient Instructions (Signed)
Start gabapentin 100 mg each am   If better after a month stop the singulair  If not better on gabapentin after one month see Dr Joya Gaskins with all medications in hand

## 2017-01-06 NOTE — Assessment & Plan Note (Signed)
Onset sept 2017 - 08/16/2014 try change to symbicort 80 2bid for insurance  - Allergy profile 11/01/15  >  Eos 0.2 /  IgE  9, neg rast  - sinus ct 11/25/2016 Mucosal thickening in the left frontal and maxillary sinus. No evidence of advanced inflammatory change. No free fluid.  - 11/18/2016  After extensive coaching HFA effectiveness =    75%  - Methacholine choline challenge off symbicort 80 > done 12/17/2016 and completely neg  - 01/06/2017 rec add gabapentin 100 mg qam to 200 qhs and if better p one month d/c singulair, if not refer to [facility]/ Dr Joya Gaskins   I had an extended final summary discussion with the patient reviewing all relevant studies completed to date and  lasting 15 to 20 minutes of a 25 minute visit on the following issues:   1) neg MCT off all inhalers rules out asthma for all intents and purposes  2) therefore this is Upper airway cough syndrome (previously labeled PNDS) , is  so named because it's frequently impossible to sort out how much is  CR/sinusitis with freq throat clearing (which can be related to primary GERD)   vs  causing  secondary (" extra esophageal")  GERD from wide swings in gastric pressure that occur with throat clearing, often  promoting self use of mint and menthol lozenges that reduce the lower esophageal sphincter tone and exacerbate the problem further in a cyclical fashion.   These are the same pts (now being labeled as having "irritable larynx syndrome" by some cough centers) who not infrequently have a history of having failed to tolerate ace inhibitors,  dry powder inhalers or biphosphonates or report having atypical/extraesophageal reflux symptoms that don't respond to standard doses of PPI  and are easily confused as having aecopd or asthma flares by even experienced allergists/ pulmonologists (myself included).   3) Rx is control cyclical cough with gerd/ gabapentin then if improve d/c singulair, if not refer to [facility] voice center for presume irritable larynx  syndrome   4) Each maintenance medication was reviewed in detail including most importantly the difference between maintenance and as needed and under what circumstances the prns are to be used.  Please see AVS for specific  Instructions which are unique to this visit and I personally typed out  which were reviewed in detail in writing with the patient and a copy provided.

## 2017-01-06 NOTE — Progress Notes (Signed)
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 Pulmonary Consult    Self referral. Pt has seen Dr Halford Chessman in the past for PNA.  She c/o "asthma attacks" x 2 wks- prod cough with yellow to white sputum. She also c/o increased SOB.    coughs to point of gagging, min actual mucus, constant sensation of pnds and throat and chest congestion but no better on saba or dulera with sob with min activity  rec Zpak  Change dulera to 100 Take 2 puffs first thing in am and then another 2 puffs about 12 hours later.  Change lisinopril to diovan 160  Only use your albuterol as a rescue medication  Take delsym two tsp every 12 hours and supplement if needed with demerol  50 mg up to 1  every 4 hours to suppress the urge to cough.  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 Follow-up    Pt states that cough and SOB are much improved, but not back to normal baseline. She is using proair on average 3 x per wk.   really Not limited by breathing from desired activities   Not using any cough suppression any more  rec Try symbicort 80 Take 2 puffs first thing in am and then another 2 puffs about 12 hours later F/u prn     10/31/2016 acute  extended ov/Zayvier Caravello re:  Recurrent cough off symbicort x years Chief Complaint  Patient presents with  . Acute Visit    Pt c/o cough since Oct 2017. She has noticed SOB for the past few days.  Her cough has worsened recently and she is cometimes coughing up thick, clear sputum.     in Sept of 2017 no maint (and hadn't been for years) on maint singulair/ saba prn very rarely  On arrival to [place] by plane Aug 16 2016 acute onset cough white thick mucus  some increased need for albuterol and started to feel better a week later when arrived back to [place]/ cough /sob seemed worse outdoors and persisted and so self referred back to pulmonary / only a little better p saba  Cough seems worse in late afternoon and after supper time but better hs  / sleeps ok But then more more congested in am esp nasal with some  Discolored am mucus but nothing really purulent  rec Whenever start cough > Try prilosec otc 74m  Take 30-60 min before first meal of the day and Pepcid ac (famotidine)  20 mg one after supper  until cough is completely gone for at least a  week without the need for cough suppression Prednisone 10 mg take  4 each am x 2 days,   2 each am x 2 days,  1 each am x 2 days and stop  zpak   Take delsym two tsp every 12 hours and supplement if needed with  Demerol 50 mg up to 1 every 4 hours to suppress the urge to cough.  Once you have eliminated the cough for 3 straight days try reducing the  Demerol first,  then the delsym as tolerated    11/18/2016  f/u ov/Johnella Crumm re:  Coughing since sept 2017 / did not follow cyclical cough instructions/ maint rx singulair Chief Complaint  Patient presents with  . Follow-up    Pt states "cough is still very croupy and tight"- has not had a day without cough yet.  She does state she is coughing less, but when she does it is still violent.  Her breathing is unchanged. She is using proair 2-3 x per day.   while on zpak developed copious production of white mucus / some  better on prn saba  Used 2 demerol per day max and still hacking cough day > noct on gabapentin 200 mg qhs  rec Please see patient coordinator before you leave today  to schedule sinus CT  try gabapentin 100 mg one  with bfast, lunch and supper then 2 at bedtime Plan A = Automatic =  symbicort 80 Take 2 puffs first thing in am and then another 2 puffs about 12 hours later                                      Continue Try prilosec otc 6m  Take 30-60 min before first meal of the day and Pepcid ac (famotidine) 20 mg one @  bedtime  Until return     Work on inhaler technique:  relax and gently blow all the way out then take a nice smooth deep breath back in, triggering the inhaler at same time you start breathing in.  Hold for up to 5 seconds if you can. Blow out thru nose. Rinse and gargle with water when done Plan B = Backup Only use your albuterol (ventolin) as a rescue medication to be used if you can't catch your breath by resting or doing a relaxed purse lip breathing pattern.  - The less you use it, the better it will work when you need it. - Ok to use the inhaler up to 2 puffs  every 4 hours if you must but call for appointment if use goes up over your usual need - Don't leave home without it !!  (think of it like the spare tire for your car)  For cough > use the flutter valve and Take delsym two tsp every 12 hours and supplement if needed with demerol 50 mg up to 2 every 4 hours to suppress the urge to cough. Swallowing water or using ice chips/non mint and menthol containing candies (such as lifesavers or sugarless jolly ranchers) are also effective.  You should rest your voice and avoid activities that you know make you cough. Once you have eliminated the cough for 3 straight days try reducing the demerol  first,  then the delsym as tolerated.   Please schedule a follow up office visit in 2 weeks, sooner if needed  - bring all meds in two bags - one is  maintenance and the other is as needed       12/03/2016  f/u ov/Zavior Thomason re: coughing since Sept 2017 on symb 80/ singulair and not able to take gabapentin due to cns effects  - not clear at all she follwed instructions/ did not bring all meds as req/ says nothing made any difference so far to the hx as above/ cough is mostly day > noct/ dry > wet  rec Stop symbicort completely one week before your methacholine challenge but this is the only change   schedule methacholine challenge testing more than a week from when you stop symbicort > completely neg       01/06/2017  f/u ov/Shuna Tabor re:  Upper airway cough syndrome with neg MCT / on max rx for gerd plus singulair and gabapentin hs only  Chief Complaint  Patient presents with  . Follow-up    Review MCT. Cough has resolved.   no longer using symbicort and does not recall last time she used saba with no longer any cough or sob    No obvious day to day or daytime variability or assoc  cp or chest tightness, subjective wheeze or overt sinus or hb symptoms. No unusual exp hx or h/o childhood pna/ asthma or knowledge of premature birth.  Sleeping ok without nocturnal  or early am exacerbation  of respiratory  c/o's or need for noct saba. Also denies any obvious fluctuation of symptoms with weather or environmental changes or other aggravating or alleviating factors except as outlined above   Current Medications, Allergies, Complete Past Medical History, Past Surgical History, Family History, and Social History were reviewed in [employer] record.  ROS  The following are not active complaints unless bolded sore throat, dysphagia, dental problems, itching, sneezing,  nasal congestion or excess/ purulent secretions, ear ache,   fever, chills, sweats, unintended wt loss, classically pleuritic or exertional cp,  orthopnea pnd or leg swelling, presyncope, palpitations, abdominal pain, anorexia, nausea, vomiting, diarrhea  or change in bowel or bladder habits, change in stools or  urine, dysuria,hematuria,  rash, arthralgias, visual complaints, headache, numbness, weakness or ataxia or problems with walking or coordination,  change in mood/affect or memory.            Objective:   Physical Exam  01/06/2017       142  12/03/2016         142   11/18/2016       144  10/31/16 141 lb 3.2 oz (64 kg)  07/09/16 140 lb (63.5 kg)  03/28/16 136 lb (61.7 kg)    Vital signs reviewed -  - Note on arrival 02 sats  96% on RA     amb wf  nad no longer throat clearing   HEENT: nl dentition, turbinates, and orophanx. Nl external ear canals without cough reflex - moderate bilateral non-specific turbinate edema    NECK :  without JVD/Nodes/TM/ nl carotid upstrokes bilaterally   LUNGS: no acc muscle use, completely clear to A and P with no cough on insp or exp    CV:  RRR  no s3 or murmur or increase in P2, no edema   ABD:  soft and nontender with nl excursion in the supine position. No bruits or organomegaly, bowel sounds nl  MS:  warm without deformities, calf tenderness, cyanosis or clubbing         CXR PA and Lateral:   10/31/2016 :    I personally reviewed images and agree  with radiology impression as follows:   Mild hyperinflation consistent with known asthma. No acute pneumonia or CHF. Previous left lateral seventh and eighth rib fractures with overlying soft tissue thickening which is more prominent than on the previous study.            Assessment & Plan:

## 2017-01-07 ENCOUNTER — Encounter: Payer: Self-pay | Admitting: Thoracic Surgery (Cardiothoracic Vascular Surgery)

## 2017-01-08 ENCOUNTER — Encounter: Payer: Self-pay | Admitting: Internal Medicine

## 2017-01-09 ENCOUNTER — Telehealth: Payer: Self-pay

## 2017-01-09 ENCOUNTER — Other Ambulatory Visit: Payer: Self-pay | Admitting: Internal Medicine

## 2017-01-09 NOTE — Telephone Encounter (Signed)
PA started via covermymeds.  KEY: [id number]

## 2017-01-10 NOTE — Telephone Encounter (Signed)
Pt informed of same and to contact pharmacy for refills.

## 2017-01-10 NOTE — Telephone Encounter (Signed)
PA approved.

## 2017-01-29 ENCOUNTER — Other Ambulatory Visit: Payer: Self-pay | Admitting: Internal Medicine

## 2017-02-06 ENCOUNTER — Telehealth: Payer: Self-pay | Admitting: Internal Medicine

## 2017-02-06 NOTE — Telephone Encounter (Signed)
Pt called in said that's she needs PA started on her Eszopiclone 3 MG TABS [[id number]].  Pt is going out of town today and needs this asap

## 2017-02-06 NOTE — Telephone Encounter (Signed)
Got call with PA approval for one year, patient contacted

## 2017-02-06 NOTE — Telephone Encounter (Signed)
The pt called stating that her insurance Nurse, mental health) is sending over a 24 hour emergency authorization so that she can have this processed sooner. She said that they should be faxing it over to [place] today.

## 2017-02-13 ENCOUNTER — Other Ambulatory Visit: Payer: Self-pay | Admitting: Thoracic Surgery (Cardiothoracic Vascular Surgery)

## 2017-02-13 DIAGNOSIS — I7121 Aneurysm of the ascending aorta, without rupture: Secondary | ICD-10-CM

## 2017-02-13 DIAGNOSIS — I712 Thoracic aortic aneurysm, without rupture: Secondary | ICD-10-CM

## 2017-02-21 NOTE — Telephone Encounter (Signed)
error 

## 2017-02-25 ENCOUNTER — Other Ambulatory Visit: Payer: Self-pay | Admitting: Internal Medicine

## 2017-03-20 ENCOUNTER — Other Ambulatory Visit: Payer: Self-pay | Admitting: Thoracic Surgery (Cardiothoracic Vascular Surgery)

## 2017-03-25 ENCOUNTER — Ambulatory Visit
Admission: RE | Admit: 2017-03-25 | Discharge: 2017-03-25 | Disposition: A | Discharge status: Home / Self Care | Payer: Medicare Other | Source: Ambulatory Visit | Attending: Thoracic Surgery (Cardiothoracic Vascular Surgery) | Admitting: Thoracic Surgery (Cardiothoracic Vascular Surgery)

## 2017-03-25 ENCOUNTER — Encounter: Payer: Self-pay | Admitting: Thoracic Surgery (Cardiothoracic Vascular Surgery)

## 2017-03-25 ENCOUNTER — Ambulatory Visit (INDEPENDENT_AMBULATORY_CARE_PROVIDER_SITE_OTHER): Payer: Medicare Other | Admitting: Thoracic Surgery (Cardiothoracic Vascular Surgery)

## 2017-03-25 VITALS — BP 150/84 | HR 68 | Resp 20 | Ht 64.5 in | Wt 140.0 lb

## 2017-03-25 DIAGNOSIS — I712 Thoracic aortic aneurysm, without rupture: Secondary | ICD-10-CM

## 2017-03-25 DIAGNOSIS — I719 Aortic aneurysm of unspecified site, without rupture: Secondary | ICD-10-CM | POA: Diagnosis not present

## 2017-03-25 DIAGNOSIS — I7121 Aneurysm of the ascending aorta, without rupture: Secondary | ICD-10-CM

## 2017-03-25 MED ORDER — GADOBENATE DIMEGLUMINE 529 MG/ML IV SOLN
13.0000 mL | Freq: Once | INTRAVENOUS | Status: AC | PRN
  Administered 2017-03-25: 13 mL via INTRAVENOUS

## 2017-03-25 NOTE — Progress Notes (Signed)
[address]       [place] [address]             [phone]    HPI: 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 We have followed her on an annual basis since then. I last saw her in May 2017. Her aneurysm was stable at 4.2-4.3 cm that time.  She says she's been feeling poorly lately. She has not been having any chest pain. It's more of a general malaise. She says her blood pressure continues to go up and down.  Past Medical History:  Diagnosis Date  . Arthritis   . Asthma   . Bursitis   . Cataract    mild  . Dysthymia   . Dysthymia   . Hemorrhoids   . Hx of ulcerative colitis    per dr Arnoldo Morale as per pt.  . Hyperlipidemia   . Hypertension    managed   . Migraine   . MVP (mitral valve prolapse)   . Scoliosis   . 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 Spinal stenosis   . Thoracic aortic aneurysm ([place])   . Thyroid disease     Current Outpatient Prescriptions  Medication Sig Dispense Refill  . albuterol (PROAIR HFA) 108 (90 Base) MCG/ACT inhaler INHALE 2 PUFFS INTO THE LUNGS EVERY 6 (SIX) HOURS AS NEEDED. 8.5 each 2  . carisoprodol (SOMA) 350 MG tablet TAKE 1 TABLET BY MOUTH THREE TIMES DAILY AS NEEDED 60 tablet 0  . cycloSPORINE (RESTASIS) 0.05 % ophthalmic emulsion Place 1 drop into both eyes 2 (two) times daily.    . Eszopiclone 3 MG TABS TAKE 1 TABLET BY MOUTH EVERY NIGHT AT BEDTIME 30 tablet 5  . famotidine (PEPCID) 20 MG tablet Take 20 mg by mouth at bedtime.    [name] Kitchen FLUoxetine (PROZAC) 40 MG capsule Take 1 capsule (40 mg total) by mouth 2 (two) times daily. 60 capsule 5  . gabapentin (NEURONTIN) 100 MG capsule One in am and two in pm 90 capsule 11  . levothyroxine (SYNTHROID,  LEVOTHROID) 125 MCG tablet TAKE 1 TABLET(125 MCG) BY MOUTH DAILY 90 tablet 0  . meperidine (DEMEROL) 50 MG tablet Take 1 tablet (50 mg total) by mouth every 6 (six) hours as needed for severe pain. One every 4 hours if coughing 90 tablet 0  . MINIVELLE 0.05 MG/24HR patch Place 1 patch onto the skin 2 (two) times a week.   3  . montelukast (SINGULAIR) 10 MG tablet Take 1 tablet (10 mg total) by mouth at bedtime. 90 tablet 2  . MYRBETRIQ 50 MG TB24 tablet TAKE 1 TABLET(50 MG) BY MOUTH DAILY 90 tablet 0  . omeprazole (PRILOSEC) 20 MG capsule Take 20 mg by mouth daily before breakfast.    . Respiratory Therapy Supplies (FLUTTER) DEVI Use as directed 1 each 0  . valsartan (DIOVAN) 160 MG tablet Take 2 tablets (320 mg total) by mouth daily. 180 tablet 3  . verapamil (CALAN-SR) 240 MG CR tablet Take 1 tablet (240 mg total) by mouth at bedtime. 90 tablet 2   No current facility-administered medications for this visit.     Physical Exam BP [name] Kitchen)  150/84   Pulse 68   Resp 20   Ht 5' 4.5" (1.638 m)   Wt 140 lb (63.5 kg)   SpO2 99%   BMI 23.17 kg/m  71 year old woman in no acute distress Well-developed well-nourished Alert and oriented 3 with no focal neurologic deficits Neck- transmitted murmur Cardiac regular rate and rhythm with a 3/6 systolic murmur and loud click Lungs clear with equal breath sounds bilaterally  Diagnostic Tests: MRA CHEST WITH CONTRAST  TECHNIQUE: Angiographic images of the chest were obtained using MRA technique with intravenous contrast.  CONTRAST:  90m MULTIHANCE GADOBENATE DIMEGLUMINE 529 MG/ML IV SOLN  Creatinine was obtained on site at [facility] [address]  Results: Creatinine 0.7 mg/dL.  COMPARISON:  Chest MRI- 03/12/2016; 02/14/2015; 01/28/2011; chest CT - 01/30/2010  FINDINGS: Vascular Findings:  Grossly unchanged fusiform aneurysmal dilatation of the ascending thoracic aorta was measurements as follows. No evidence of  thoracic aortic dissection or periaortic stranding on this nongated examination. The thoracic aorta tapers to a normal caliber at the level of the aortic arch. The descending thoracic aorta is mildly tortuous but of normal caliber.  Conventional configuration of the aortic arch. The branch vessels of the aortic arch appear widely patent throughout their imaged course.  Normal heart size.  No pericardial effusion.  Although this examination was not tailored for the evaluation the pulmonary arteries, there are no discrete filling defects within the central pulmonary arterial tree to suggest central pulmonary embolism. Normal caliber the main pulmonary artery.  -------------------------------------------------------------  Thoracic aortic measurements:  Sinotubular junction  2.8 cm as measured in greatest oblique coronal dimension.  Proximal ascending aorta  4 cm as measured in greatest oblique axial dimension at the level of the main pulmonary artery (image 30, series 12), and approximately 4.3 cm in greatest oblique sagittal diameter (image 43, series 9), grossly unchanged compared to the 01/2011 examination.  Aortic arch aorta  2.4 cm as measured in greatest oblique sagittal dimension.  Proximal descending thoracic aorta  2.3 cm as measured in greatest oblique axial dimension at the level of the main pulmonary artery (image 30, series 12).  Distal descending thoracic aorta  1.9 cm as measured in greatest oblique axial dimension at the level of the diaphragmatic hiatus (image 80, series 12).  Review of the MIP images confirms the above findings.  -------------------------------------------------------------  Non-Vascular Findings:  No focal airspace opacities.  Limited visualization of the upper abdomen is normal.  Regional osseous and soft tissue structures appear normal.  IMPRESSION: Stable uncomplicated mild fusiform aneurysmal  dilatation of the ascending thoracic aorta measuring approximately 4.3 cm, unchanged compared to the 2012 examination.   Electronically Signed   By: JSandi MariscalM.D.   On: 03/25/2017 15:16 I personally reviewed the MRA and concur with the findings noted above.  Impression: Mrs. RProsseris a 71year old woman with a known 4.3 cm ascending aneurysm and hypertension. We have been following her ascending aneurysm on an annual basis and it is unchanged over the past year.  Hypertension- this is been difficult for control in the past. She thinks one of her blood pressure medications was increased. She says that her blood pressure vacillates up and down. I recommended that she'll check that on a regular basis same time every day over the next month. Then will be able to make a recommendation based on that data.  Heart murmur- she has a prominent systolic murmur with a loud click. I think her murmur is a little  more prominent on exam today. She has not had an echocardiogram in about 10 years so I think we repeat that.   Plan: Check blood pressure home on daily basis.  2-D echocardiogram- evaluated for worsening heart murmur  Return in one month to review results of echo and reevaluate blood pressure  Melrose Nakayama, MD [facility] [phone]

## 2017-03-26 ENCOUNTER — Other Ambulatory Visit: Payer: Self-pay | Admitting: *Deleted

## 2017-03-26 DIAGNOSIS — R011 Cardiac murmur, unspecified: Secondary | ICD-10-CM

## 2017-04-04 ENCOUNTER — Other Ambulatory Visit: Payer: Self-pay | Admitting: Internal Medicine

## 2017-04-08 ENCOUNTER — Encounter: Payer: Self-pay | Admitting: Internal Medicine

## 2017-04-08 ENCOUNTER — Ambulatory Visit (HOSPITAL_COMMUNITY)
Admission: RE | Admit: 2017-04-08 | Discharge: 2017-04-08 | Disposition: A | Discharge status: Home / Self Care | Payer: Medicare Other | Source: Ambulatory Visit | Attending: Thoracic Surgery (Cardiothoracic Vascular Surgery) | Admitting: Thoracic Surgery (Cardiothoracic Vascular Surgery)

## 2017-04-08 DIAGNOSIS — R011 Cardiac murmur, unspecified: Secondary | ICD-10-CM | POA: Diagnosis not present

## 2017-04-08 DIAGNOSIS — I082 Rheumatic disorders of both aortic and tricuspid valves: Secondary | ICD-10-CM | POA: Diagnosis not present

## 2017-04-08 DIAGNOSIS — I77810 Thoracic aortic ectasia: Secondary | ICD-10-CM | POA: Diagnosis not present

## 2017-04-08 MED ORDER — MEPERIDINE HCL 50 MG PO TABS: 50.0000 mg | ORAL_TABLET | Freq: Four times a day (QID) | ORAL | 0 refills | Status: DC | PRN

## 2017-04-08 NOTE — Progress Notes (Signed)
  Echocardiogram 2D Echocardiogram has been performed.  Marissa [name] 04/08/2017, 3:13 PM

## 2017-04-10 ENCOUNTER — Telehealth: Payer: Self-pay

## 2017-04-10 NOTE — Telephone Encounter (Signed)
PA for meperidine 50 mg has been initiated.   KEY: [id number]

## 2017-04-14 DIAGNOSIS — I10 Essential (primary) hypertension: Secondary | ICD-10-CM | POA: Diagnosis not present

## 2017-04-14 DIAGNOSIS — N390 Urinary tract infection, site not specified: Secondary | ICD-10-CM | POA: Diagnosis not present

## 2017-04-25 ENCOUNTER — Other Ambulatory Visit: Payer: Self-pay | Admitting: Family

## 2017-04-29 ENCOUNTER — Encounter: Payer: Medicare Other | Admitting: Thoracic Surgery (Cardiothoracic Vascular Surgery)

## 2017-04-29 DIAGNOSIS — Z1231 Encounter for screening mammogram for malignant neoplasm of breast: Secondary | ICD-10-CM | POA: Diagnosis not present

## 2017-04-29 DIAGNOSIS — Z01419 Encounter for gynecological examination (general) (routine) without abnormal findings: Secondary | ICD-10-CM | POA: Diagnosis not present

## 2017-04-29 DIAGNOSIS — Z6823 Body mass index (BMI) 23.0-23.9, adult: Secondary | ICD-10-CM | POA: Diagnosis not present

## 2017-05-01 ENCOUNTER — Other Ambulatory Visit: Payer: Self-pay | Admitting: Obstetrics and Gynecology

## 2017-05-01 DIAGNOSIS — R928 Other abnormal and inconclusive findings on diagnostic imaging of breast: Secondary | ICD-10-CM

## 2017-05-06 ENCOUNTER — Encounter: Payer: Medicare Other | Admitting: Thoracic Surgery (Cardiothoracic Vascular Surgery)

## 2017-05-08 ENCOUNTER — Ambulatory Visit
Admission: RE | Admit: 2017-05-08 | Discharge: 2017-05-08 | Disposition: A | Discharge status: Home / Self Care | Payer: Medicare Other | Source: Ambulatory Visit | Attending: Obstetrics and Gynecology | Admitting: Obstetrics and Gynecology

## 2017-05-08 ENCOUNTER — Ambulatory Visit: Payer: Medicare Other

## 2017-05-08 DIAGNOSIS — R928 Other abnormal and inconclusive findings on diagnostic imaging of breast: Secondary | ICD-10-CM | POA: Diagnosis not present

## 2017-05-08 LAB — HM MAMMOGRAPHY: HM MAMMO: NORMAL (ref 0–4)

## 2017-05-14 ENCOUNTER — Encounter: Payer: Self-pay | Admitting: Internal Medicine

## 2017-05-16 ENCOUNTER — Other Ambulatory Visit: Payer: Self-pay | Admitting: Family

## 2017-05-16 ENCOUNTER — Encounter: Payer: Self-pay | Admitting: Internal Medicine

## 2017-05-16 MED ORDER — MEPERIDINE HCL 50 MG PO TABS: 50.0000 mg | ORAL_TABLET | Freq: Four times a day (QID) | ORAL | 0 refills | Status: DC | PRN

## 2017-05-16 NOTE — Telephone Encounter (Signed)
Pt called in and needs this refill asap?

## 2017-05-27 ENCOUNTER — Encounter: Payer: Self-pay | Admitting: Thoracic Surgery (Cardiothoracic Vascular Surgery)

## 2017-05-27 ENCOUNTER — Ambulatory Visit (INDEPENDENT_AMBULATORY_CARE_PROVIDER_SITE_OTHER): Payer: Medicare Other | Admitting: Thoracic Surgery (Cardiothoracic Vascular Surgery)

## 2017-05-27 VITALS — BP 155/82 | HR 79 | Resp 20 | Ht 64.5 in | Wt 138.0 lb

## 2017-05-27 DIAGNOSIS — I712 Thoracic aortic aneurysm, without rupture: Secondary | ICD-10-CM

## 2017-05-27 DIAGNOSIS — I350 Nonrheumatic aortic (valve) stenosis: Secondary | ICD-10-CM

## 2017-05-27 DIAGNOSIS — I7121 Aneurysm of the ascending aorta, without rupture: Secondary | ICD-10-CM

## 2017-05-27 NOTE — Progress Notes (Signed)
[address]       [place] [address]             [phone]    HPI: Mrs. Kato returns to discuss the results of her echocardiogram.  Mrs. Needs is a 71 year old woman with a history of hypertension, hyperlipidemia, mitral valve prolapse, arthritis, asthma, ulcerative colitis, scoliosis, spinal stenosis, and an ascending aneurysm that I been following since 2012. I saw her in May of this year for an annual follow-up of her ascending aneurysm. MR angiogram showed no change in the aneurysm. However she complained of feeling poorly and having a highly variable blood pressure. On exam or murmur seemed more prominent to me so I ordered an echocardiogram.  In the interim since her last visit she had an episode of chest pain lasted about 2 hours. She did not go to the emergency room. Her blood pressure was elevated time. She says that her blood pressure has been up and down over the past several weeks. She has a long history of inability to tolerate different blood pressure medications. She has not had any additional chest pain.  Past Medical History:  Diagnosis Date  . Arthritis   . Asthma   . Bursitis   . Cataract    mild  . Dysthymia   . Dysthymia   . Hemorrhoids   . Hx of ulcerative colitis    per dr Arnoldo Morale as per pt.  . Hyperlipidemia   . Hypertension    managed   . Migraine   . MVP (mitral valve prolapse)   . Scoliosis   . 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 Spinal stenosis   . Thoracic aortic aneurysm ([place])   . Thyroid disease     Current Outpatient Prescriptions  Medication Sig Dispense Refill  . albuterol (PROAIR HFA) 108 (90 Base) MCG/ACT inhaler INHALE 2 PUFFS INTO THE LUNGS EVERY 6 (SIX) HOURS AS NEEDED. 8.5 each 2  . carisoprodol (SOMA) 350 MG tablet TAKE 1 TABLET BY MOUTH THREE TIMES DAILY AS NEEDED 60 tablet 0  .  cycloSPORINE (RESTASIS) 0.05 % ophthalmic emulsion Place 1 drop into both eyes 2 (two) times daily.    . Eszopiclone 3 MG TABS TAKE 1 TABLET BY MOUTH EVERY NIGHT AT BEDTIME 30 tablet 5  . famotidine (PEPCID) 20 MG tablet Take 20 mg by mouth at bedtime.    [name] Kitchen FLUoxetine (PROZAC) 40 MG capsule Take 1 capsule (40 mg total) by mouth 2 (two) times daily. 60 capsule 5  . gabapentin (NEURONTIN) 100 MG capsule One in am and two in pm 90 capsule 11  . levothyroxine (SYNTHROID, LEVOTHROID) 125 MCG tablet TAKE 1 TABLET(125 MCG) BY MOUTH DAILY 90 tablet 0  . meperidine (DEMEROL) 50 MG tablet Take 1 tablet (50 mg total) by mouth every 6 (six) hours as needed for severe pain. One every 4 hours if coughing 90 tablet 0  . MINIVELLE 0.05 MG/24HR patch Place 1 patch onto the skin 2 (two) times a week.   3  . mirabegron ER (MYRBETRIQ) 50 MG TB24 tablet Take 1 tablet (50 mg total) by mouth daily. Annual appt due in September must see MD for refills 90 tablet 0  . montelukast (SINGULAIR) 10 MG tablet Take 1 tablet (10 mg total) by mouth at bedtime. 90 tablet  2  . omeprazole (PRILOSEC) 20 MG capsule Take 20 mg by mouth daily before breakfast.    . Respiratory Therapy Supplies (FLUTTER) DEVI Use as directed 1 each 0  . valsartan (DIOVAN) 160 MG tablet Take 2 tablets (320 mg total) by mouth daily. 180 tablet 3  . verapamil (CALAN-SR) 240 MG CR tablet Take 1 tablet (240 mg total) by mouth at bedtime. 90 tablet 2   No current facility-administered medications for this visit.     Physical Exam BP (!) 155/82   Pulse 79   Resp 20   Ht 5' 4.5" (1.638 m)   Wt 138 lb (62.6 kg)   SpO2 96% Comment: RA  BMI 23.43 kg/m  71 year old woman in no acute distress Alert and oriented 3 with no focal deficits Lungs clear with equal breath sounds bilaterally Cardiac regular rate and rhythm with 2/6 systolic murmur  Diagnostic Tests: Study Conclusions  - Left ventricle: The cavity size was normal. Wall thickness was    increased in a pattern of moderate LVH. Systolic function was   normal. The estimated ejection fraction was in the range of 60%   to 65%. Left ventricular diastolic function parameters were   normal. - Aortic valve: Poor image quality due to respiratory interference.   Suggest tech repeat dedicated images to determine if valve is   bicuspid   Consdier TEE to furtehr evaluate. Important in regard to   prognosis of dilated aortic root. There was mild stenosis. Valve   area (VTI): 1.61 cm^2. Valve area (Vmax): 1.41 cm^2. Valve area   (Vmean): 1.48 cm^2. - Left atrium: The atrium was mildly dilated. - Atrial septum: No defect or patent foramen ovale was identified. I personally reviewed the echocardiogram images and concur with the findings noted above  Impression: Mrs. Dardis is a 71 year old woman with a 4.3 cm ascending aneurysm and hypertension. A recent echocardiogram showed normal systolic function with mild aortic stenosis.  Hypertension - Her blood pressure has been difficult to control for many years. She has intolerance to multiple blood pressure medications. She currently is on verapamil 240 mg daily and valsartan 320 mg daily. She continues to strictly run blood pressures high as 341 systolic.  She had questions regarding the recent reports of a carcinogen linked to valsartan. After that that is due to a added to them that medication from a single producer in [place]. She will check with her pharmacy to see if her medication came from that lot.  Ascending aneurysm- unchanged at 4.3 cm. Continue annual follow-up.  Aortic stenosis- mild will need annual echocardiogram follow-up.  Given her blood pressure, ascending aneurysm, chest pain and mild aortic stenosis I think it would be reasonable to get her a cardiologist. She has not seen one in the past but is requesting Dr. Johnsie Cancel as he cared for her husband at some point in the past. I will see if we can arrange that.  Plan: Cardiology  consultation  I will plan to see her back in one year with an MR angiogram of the chest to follow-up the aneurysm.  Melrose Nakayama, MD [facility] [phone]

## 2017-05-28 DIAGNOSIS — Q231 Congenital insufficiency of aortic valve: Secondary | ICD-10-CM

## 2017-05-28 DIAGNOSIS — Q2381 Bicuspid aortic valve: Secondary | ICD-10-CM

## 2017-05-28 HISTORY — DX: Bicuspid aortic valve: Q23.81

## 2017-05-28 HISTORY — DX: Congenital insufficiency of aortic valve: Q23.1

## 2017-05-29 ENCOUNTER — Other Ambulatory Visit: Payer: Self-pay | Admitting: Internal Medicine

## 2017-05-29 NOTE — Telephone Encounter (Signed)
Last dispensed 05/04/2017 with 30 tabs for 30 days

## 2017-05-30 ENCOUNTER — Ambulatory Visit (INDEPENDENT_AMBULATORY_CARE_PROVIDER_SITE_OTHER): Payer: Medicare Other | Admitting: Cardiology

## 2017-05-30 ENCOUNTER — Encounter: Payer: Self-pay | Admitting: Cardiology

## 2017-05-30 VITALS — BP 140/86 | HR 63 | Ht 64.4 in | Wt 137.2 lb

## 2017-05-30 DIAGNOSIS — I10 Essential (primary) hypertension: Secondary | ICD-10-CM

## 2017-05-30 DIAGNOSIS — I350 Nonrheumatic aortic (valve) stenosis: Secondary | ICD-10-CM | POA: Diagnosis not present

## 2017-05-30 DIAGNOSIS — I498 Other specified cardiac arrhythmias: Secondary | ICD-10-CM | POA: Diagnosis not present

## 2017-05-30 DIAGNOSIS — R0609 Other forms of dyspnea: Secondary | ICD-10-CM | POA: Diagnosis not present

## 2017-05-30 DIAGNOSIS — R0600 Dyspnea, unspecified: Secondary | ICD-10-CM

## 2017-05-30 DIAGNOSIS — I712 Thoracic aortic aneurysm, without rupture, unspecified: Secondary | ICD-10-CM

## 2017-05-30 DIAGNOSIS — R0789 Other chest pain: Secondary | ICD-10-CM | POA: Diagnosis not present

## 2017-05-30 DIAGNOSIS — R5382 Chronic fatigue, unspecified: Secondary | ICD-10-CM

## 2017-05-30 DIAGNOSIS — Z79899 Other long term (current) drug therapy: Secondary | ICD-10-CM

## 2017-05-30 DIAGNOSIS — G9332 Myalgic encephalomyelitis/chronic fatigue syndrome: Secondary | ICD-10-CM

## 2017-05-30 MED ORDER — IRBESARTAN-HYDROCHLOROTHIAZIDE 300-12.5 MG PO TABS: 1.0000 | ORAL_TABLET | Freq: Every day | ORAL | 6 refills | Status: DC

## 2017-05-30 NOTE — Patient Instructions (Addendum)
Medication  Change VALSARTAN to  Irbesartan - hctz  300/12.5 mg one  Daily   Labs next week - THURSDAY CMP LIPIDS   SCHEDULE AT [place] [address] Your physician has requested that you have an echocardiogram. Echocardiography is a painless test that uses sound waves to create images of your heart. It provides your doctor with information about the size and shape of your heart and how well your heart's chambers and valves are working. This procedure takes approximately one hour. There are no restrictions for this procedure. Your physician has recommended that you wear an event monitor 2 WEEKS. Event monitors are medical devices that record the heart's electrical activity. Doctors most often [place] these monitors to diagnose arrhythmias. Arrhythmias are problems with the speed or rhythm of the heartbeat. The monitor is a small, portable device. You can wear one while you do your normal daily activities. This is usually used to diagnose what is causing palpitations/syncope (passing out).   Your physician recommends that you schedule a follow-up appointment in 2 [place].

## 2017-05-30 NOTE — Progress Notes (Signed)
PCP: Marissa Koch, MD  Clinic Note: Chief Complaint  Patient presents with  . New Patient (Initial Visit)    AS, TAA  . Chest Pain    tightness  . Shortness of Breath    regularly   . Dizziness    daily.    HPI:  Marissa [name] is a 71 y.o. female who is being seen today for the evaluation of Initial cardiology evaluation at the request of Marissa [name], *. She had been followed by Dr. Roxan [name] for an ascending aortic aneurysm and heart murmur with loud click. The aneurysm has been followed since 2012, and her aneurysm has remained stable at the 4.2-4.3 cm level.  In addition to the ascending aortic aneurysm, she has hypertension that apparently her original primary care doctor (Dr. Arnoldo [name]) had a difficult time managing having to use valsartan and verapamil because she was intolerant of other medications. Along with hypertension, she has hyperlipidemia, ulcerative colitis, scoliosis, spinal stenosis as well as asthma and arthritis. There is also suggestive history of mitral prolapse.  Marissa [name] was seen by Dr. Roxan [name] recently on July 31 for follow-up to discuss results her echocardiogram and MR angiogram that showed stable aneurysm. She is noted to have a high blood pressures that have been variable.  During that visit, she noted having episodes chest pain lasting about 2 hours for which she did not go to the emergency room. She had significantly older blood pressure that time, has indicated her blood pressures been up and down over the last several weeks to a month and half. She is not had further symptoms since that time according to him.  Recent Hospitalizations: None  Studies Personally Reviewed - (if available, images/films reviewed: From Epic Chart or Care Everywhere)  2-D Echocardiogram 04/08/2017: Moderate LVH. Normal EF 60-65%. Normal diastolic parameters. --> Difficult to fully visualize the aortic valve. Cannot exclude bicuspid valve. Mild  aortic stenosis noted. No PFO. Mildly dilated left atrium. Trivial MR. No comment on mitral valve prolapse. Moderately dilated ascending aorta.  Recommendation was to consider repeat dedicated echo to evaluate aortic valve versus TEE.  Interval History: Marissa [name] presents here today really with multiple different complaints. Her review of symptoms is essentially positive making it very difficult to determine what she is actually noting. She again reiterated the episode of significant chest pain a week or so ago that happened in the morning. Lasted maybe half an hour or more. Her blood pressures were quite elevated at the time in the 197/103 range. She try to take deep breaths but the pain continued. She, took a muscle relaxant and the pain went away when she went to sleep. After that episode she just has not felt well. She's noted some exertional dyspnea more so than usual as well as fatigue. She has not had another episode of chest pain like that, but has had some mild twinges. She has been noticing some exertional dyspnea more usual. Doing things like shopping or vacuuming and going up steps now makes her short of breath.  She is also noted of late that her heart rate will either go up or down in the course the day as does her blood pressure. There is no real rhyme or reason to it. She doesn't really note any to her rate or rhythm.   She has had almost daily episodes of dizziness and wooziness, but no syncope/near syncope. No TIA/amaurosis fugax symptoms. No claudication.  ROS: A comprehensive was  performed. Review of Systems  Constitutional: Positive for malaise/fatigue. Negative for chills and fever.  HENT: Negative for congestion and nosebleeds.   Respiratory: Positive for shortness of breath. Negative for cough and wheezing.   Cardiovascular: Positive for chest pain (Per history of present illness).  Gastrointestinal: Negative for blood in stool.  Genitourinary: Negative for dysuria and  hematuria.  Musculoskeletal: Positive for joint pain and myalgias.       Chronic fatigue and fibromyalgia pains  Skin: Negative.   Neurological: Positive for dizziness and headaches. Negative for focal weakness and loss of consciousness.  Psychiatric/Behavioral: The patient is nervous/anxious.   All other systems reviewed and are negative.   I have reviewed and (if needed) personally updated the patient's problem list, medications, allergies, past medical and surgical history, social and family history.   Past Medical History:  Diagnosis Date  . Arthritis   . Asthma   . Bursitis   . Cataract    mild  . Dysthymia   . Dysthymia   . Hemorrhoids   . Hx of ulcerative colitis    per dr Marissa [name] as per pt.  . Hyperlipidemia   . Hypertension    managed - labile.  . Migraine   . MVP (mitral valve prolapse)    Not seen on Echo June 2018  . Scoliosis   . 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 Spinal stenosis   . Thoracic aortic aneurysm (HCC)    Stable 4.2-4.3 cm (followed by Dr. Roxan [name])  . Thyroid disease     Past Surgical History:  Procedure Laterality Date  . ABDOMINAL HYSTERECTOMY    . BUNIONECTOMY    . COLONOSCOPY  02-04-2005   all normal   . FOOT SURGERY     3 pins in toes   . HAND SURGERY     left thumb joint resection  . nasal revision    . SINUS IRRIGATION    . TONSILLECTOMY AND ADENOIDECTOMY    . TRANSTHORACIC ECHOCARDIOGRAM  03/2017   Moderate LVH. Normal EF 60-65%. Normal diastolic parameters. --> Difficult to fully visualize the aortic valve. Cannot exclude bicuspid valve. Mild aortic stenosis noted. No PFO. Mildly dilated left atrium. Trivial MR. No comment on mitral valve prolapse. Moderately dilated ascending aorta.    Current Meds  Medication Sig  . albuterol (PROAIR HFA) 108 (90 Base) MCG/ACT inhaler INHALE 2 PUFFS INTO THE LUNGS  EVERY 6 (SIX) HOURS AS NEEDED.  . carisoprodol (SOMA) 350 MG tablet TAKE 1 TABLET BY MOUTH THREE TIMES DAILY AS NEEDED  . cycloSPORINE (RESTASIS) 0.05 % ophthalmic emulsion Place 1 drop into both eyes 2 (two) times daily.  . Eszopiclone 3 MG TABS TAKE 1 TABLET BY MOUTH EVERY NIGHT AT BEDTIME  . famotidine (PEPCID) 20 MG tablet Take 20 mg by mouth at bedtime.  [name] Kitchen FLUoxetine (PROZAC) 40 MG capsule Take 1 capsule (40 mg total) by mouth 2 (two) times daily.  [name] Kitchen levothyroxine (SYNTHROID, LEVOTHROID) 125 MCG tablet TAKE 1 TABLET(125 MCG) BY MOUTH DAILY  . meperidine (DEMEROL) 50 MG tablet Take 1 tablet (50 mg total) by mouth every 6 (six) hours as needed for severe pain. One every 4 hours if coughing  . MINIVELLE 0.05 MG/24HR patch Place 1 patch onto the skin 2 (two) times a week.   . mirabegron ER (MYRBETRIQ) 50 MG TB24 tablet Take 1  tablet (50 mg total) by mouth daily. Annual appt due in September must see MD for refills  . montelukast (SINGULAIR) 10 MG tablet Take 1 tablet (10 mg total) by mouth at bedtime.  [name] Kitchen omeprazole (PRILOSEC) 20 MG capsule Take 20 mg by mouth daily before breakfast.  . Respiratory Therapy Supplies (FLUTTER) DEVI Use as directed  . verapamil (CALAN-SR) 240 MG CR tablet Take 1 tablet (240 mg total) by mouth at bedtime.  . [DISCONTINUED] gabapentin (NEURONTIN) 100 MG capsule One in am and two in pm  . [DISCONTINUED] valsartan (DIOVAN) 160 MG tablet Take 2 tablets (320 mg total) by mouth daily.    Allergies  Allergen Reactions  . Fentanyl Shortness Of Breath and Rash  . Amoxicillin Diarrhea    Severe diarrhea, rash to vaginal area with swelling   . Codeine Other (See Comments)    makes her hyper  . Conjugated Estrogens Itching and Rash  . Erythromycin Rash    had a rash with emycin, has done ok with other meds in it's class  . Piroxicam Itching and Rash    Feldene    Social History   Social History  . Marital status: Married    Spouse name: N/A  . Number of  children: N/A  . Years of education: N/A   Social History Main Topics  . Smoking status: Never Smoker  . Smokeless tobacco: Never Used  . Alcohol use 0.0 oz/week     Comment: occ   . Drug use: No  . Sexual activity: Not Asked   Other Topics Concern  . None   Social History Narrative  . None    family history includes Arthritis in her mother; Breast cancer in her maternal grandmother, paternal aunt, and paternal grandmother; Diabetes in her paternal aunt; Heart disease in her maternal grandfather and maternal uncle; Leukemia in her father.  Wt Readings from Last 3 Encounters:  05/30/17 137 lb 3.2 oz (62.2 kg)  05/27/17 138 lb (62.6 kg)  03/25/17 140 lb (63.5 kg)    PHYSICAL EXAM BP 140/86   Pulse 63   Ht 5' 4.4" (1.636 m)   Wt 137 lb 3.2 oz (62.2 kg)   LMP  (Approximate)   BMI 23.26 kg/m  Physical Exam  Constitutional: She is oriented to person, place, and time. She appears well-developed and well-nourished. No distress.  HENT:  Head: Normocephalic and atraumatic.  Mouth/Throat: No oropharyngeal exudate.  Eyes: Pupils are equal, round, and reactive to light. EOM are normal. Right eye exhibits no discharge. Left eye exhibits no discharge. No scleral icterus.  Neck: Normal range of motion. Neck supple. No JVD present. No tracheal deviation present. No thyromegaly present.  Cardiovascular: Normal rate, regular rhythm and intact distal pulses.  Exam reveals no gallop, no friction rub and no midsystolic click.   Murmur heard.  Low-pitched harsh crescendo-decrescendo early systolic murmur is present with a grade of 1/6  at the upper right sternal border I don't hear a click  Pulmonary/Chest: Effort normal. No respiratory distress. She has no wheezes. She has no rales. She exhibits no tenderness.  Abdominal: Soft. Bowel sounds are normal. She exhibits no distension. There is no tenderness. There is no rebound.  Musculoskeletal: Normal range of motion. She exhibits no edema or  deformity.  Lymphadenopathy:    She has no cervical adenopathy.  Neurological: She is alert and oriented to person, place, and time. No cranial nerve deficit.  Skin: Skin is warm and dry. No erythema.  Psychiatric:  She has a normal mood and affect. Judgment and thought content normal.  Very anxious with almost pressured speech  Nursing note and vitals reviewed.    Adult ECG Report  Rate: 63 ;  Rhythm: normal sinus rhythm and Left axis deviation (40). Septal and inferior Q waves, cannot exclude anterior/inferior infarct, age undetermined.;   Narrative Interpretation: No previous EKG compare   Other studies Reviewed: Additional studies/ records that were reviewed today include:  Recent Labs:  No recent labs available.    ASSESSMENT / PLAN: Problem List Items Addressed This Visit    Aneurysm of thoracic aorta (HCC) (Chronic)   Relevant Medications   irbesartan-hydrochlorothiazide (AVALIDE) 300-12.5 MG tablet   Other Relevant Orders   EKG 12-Lead   ECHOCARDIOGRAM COMPLETE   Lipid panel   Aortic stenosis - Primary (Chronic)    Was noted as mild on echocardiogram, however there was a recommendation to relook with dedicated 2-D echocardiogram versus TEE. For now we will do a relook 2-D echo per recommendations to exclude bicuspid aortic valve. We'll also get allow [place] to get a better look at the mitral valve to see if there is truly mitral prolapse or not.      Relevant Medications   irbesartan-hydrochlorothiazide (AVALIDE) 300-12.5 MG tablet   Other Relevant Orders   EKG 12-Lead   ECHOCARDIOGRAM COMPLETE   Lipid panel   Chest pain, atypical    She had one episode of prolonged chest pain that is somewhat concerning, however she's not had any further symptoms. For now, we are reevaluate her aortic valve with relook echocardiogram just to ensure that there is no evidence of bicuspid aortic valve or worsening stenosis. Depending on that result, would probably want to consider a stress  test, but need to get her blood pressure control first.      Chronic fatigue syndrome   Relevant Orders   EKG 12-Lead   Lipid panel   Comprehensive metabolic panel   Dyspnea on exertion    Heart complain this on a cardiac etiology of not ischemic. Her echo looked relatively normal. For risk factor modification. Stenosis and thoracic aortic aneurysm, we are checking a lipid panel and chemistry panel. After the initial evaluation is complete, would probably want to consider a stress test if symptoms persist.      Essential hypertension (Chronic)    Somewhat labile blood pressure. Her pressures had previously been controlled but then she has had intolerance to several medications in the past making it somewhat difficult. Plan: With recall of valsartan, we will switch to irbesartan and add the HCTZ component for more aggressive blood pressure control.  -- We will need to figure out what other medications she has tried in the past month and to know what the next step would be from a blood pressure control standpoint. - Reduce salt intake      Relevant Medications   irbesartan-hydrochlorothiazide (AVALIDE) 300-12.5 MG tablet   Other Relevant Orders   EKG 12-Lead   Lipid panel   Fluttering heart    She does have these episodes with heart rate going up and down course the day. Somewhat associated with her hypertension as well. That was something that she had in the past and was treated with verapamil. But now she is having breakthrough symptoms. Plan: Cardiac event monitor. Probably for at least 2 weeks to exclude an arrhythmia or other significant findings.      Relevant Orders   EKG 12-Lead   Lipid panel   Comprehensive  metabolic panel   Cardiac event monitor    Other Visit Diagnoses    Medication management       Relevant Orders   Lipid panel   Comprehensive metabolic panel      Current medicines are reviewed at length with the patient today. (+/- concerns) concerned about  Valsartan recall The following changes have been made: see below.  Patient Instructions  Medication  Change VALSARTAN to  Irbesartan - hctz  300/12.5 mg one  Daily   Labs next week - THURSDAY CMP LIPIDS   SCHEDULE AT [place] [address] Your physician has requested that you have an echocardiogram. Echocardiography is a painless test that uses sound waves to create images of your heart. It provides your doctor with information about the size and shape of your heart and how well your heart's chambers and valves are working. This procedure takes approximately one hour. There are no restrictions for this procedure. Your physician has recommended that you wear an event monitor 2 WEEKS. Event monitors are medical devices that record the heart's electrical activity. Doctors most often [place] these monitors to diagnose arrhythmias. Arrhythmias are problems with the speed or rhythm of the heartbeat. The monitor is a small, portable device. You can wear one while you do your normal daily activities. This is usually used to diagnose what is causing palpitations/syncope (passing out).   Your physician recommends that you schedule a follow-up appointment in 2 [place].   Studies Ordered:   Orders Placed This Encounter  Procedures  . Lipid panel  . Comprehensive metabolic panel  . Cardiac event monitor  . EKG 12-Lead  . ECHOCARDIOGRAM COMPLETE      [name], M.D., M.S. Interventional Cardiologist   Pager # [phone] Phone # [phone] [address] [place], [place] [address]

## 2017-06-01 ENCOUNTER — Encounter: Payer: Self-pay | Admitting: Cardiology

## 2017-06-01 DIAGNOSIS — R0600 Dyspnea, unspecified: Secondary | ICD-10-CM | POA: Insufficient documentation

## 2017-06-01 DIAGNOSIS — R0609 Other forms of dyspnea: Secondary | ICD-10-CM

## 2017-06-01 NOTE — Assessment & Plan Note (Signed)
Was noted as mild on echocardiogram, however there was a recommendation to relook with dedicated 2-D echocardiogram versus TEE. For now we will do a relook 2-D echo per recommendations to exclude bicuspid aortic valve. We'll also get allow [place] to get a better look at the mitral valve to see if there is truly mitral prolapse or not.

## 2017-06-01 NOTE — Assessment & Plan Note (Signed)
She does have these episodes with heart rate going up and down course the day. Somewhat associated with her hypertension as well. That was something that she had in the past and was treated with verapamil. But now she is having breakthrough symptoms. Plan: Cardiac event monitor. Probably for at least 2 weeks to exclude an arrhythmia or other significant findings.

## 2017-06-01 NOTE — Assessment & Plan Note (Signed)
Somewhat labile blood pressure. Her pressures had previously been controlled but then she has had intolerance to several medications in the past making it somewhat difficult. Plan: With recall of valsartan, we will switch to irbesartan and add the HCTZ component for more aggressive blood pressure control.  -- We will need to figure out what other medications she has tried in the past month and to know what the next step would be from a blood pressure control standpoint. - Reduce salt intake

## 2017-06-01 NOTE — Assessment & Plan Note (Signed)
She had one episode of prolonged chest pain that is somewhat concerning, however she's not had any further symptoms. For now, we are reevaluate her aortic valve with relook echocardiogram just to ensure that there is no evidence of bicuspid aortic valve or worsening stenosis. Depending on that result, would probably want to consider a stress test, but need to get her blood pressure control first.

## 2017-06-01 NOTE — Assessment & Plan Note (Signed)
Heart complain this on a cardiac etiology of not ischemic. Her echo looked relatively normal. For risk factor modification. Stenosis and thoracic aortic aneurysm, we are checking a lipid panel and chemistry panel. After the initial evaluation is complete, would probably want to consider a stress test if symptoms persist.

## 2017-06-04 ENCOUNTER — Ambulatory Visit: Payer: Medicare Other | Admitting: Cardiology

## 2017-06-04 ENCOUNTER — Telehealth: Payer: Self-pay | Admitting: Internal Medicine

## 2017-06-04 NOTE — Telephone Encounter (Signed)
Patient is requesting a refill on carisoprodol to be sent to [facility] in [place].  Patient spoke like she had some left but she was afraid of script running out and not having any while waiting on script to get to the pharmacy.

## 2017-06-04 NOTE — Telephone Encounter (Signed)
Check [place] registry last filled 01/30/2017 @ [facility].[name] KitchenJohny Chess

## 2017-06-04 NOTE — Telephone Encounter (Signed)
Noted  

## 2017-06-04 NOTE — Telephone Encounter (Signed)
FYI:  Patient called in stating that her cardiologist had changed her BP med.  States two days after that she fainted and fell into the floor.  Patient states she is also having shortness of breath.  I informed patient that the ED would probably be a good route to take.  I transferred patient over to [facility] for evaluation.

## 2017-06-05 DIAGNOSIS — I712 Thoracic aortic aneurysm, without rupture: Secondary | ICD-10-CM | POA: Diagnosis not present

## 2017-06-05 DIAGNOSIS — I10 Essential (primary) hypertension: Secondary | ICD-10-CM | POA: Diagnosis not present

## 2017-06-05 DIAGNOSIS — R5382 Chronic fatigue, unspecified: Secondary | ICD-10-CM | POA: Diagnosis not present

## 2017-06-05 DIAGNOSIS — I350 Nonrheumatic aortic (valve) stenosis: Secondary | ICD-10-CM | POA: Diagnosis not present

## 2017-06-06 ENCOUNTER — Other Ambulatory Visit: Payer: Self-pay | Admitting: Internal Medicine

## 2017-06-06 LAB — COMPREHENSIVE METABOLIC PANEL
A/G RATIO: 2 (ref 1.2–2.2)
ALT: 23 IU/L (ref 0–32)
AST: 26 IU/L (ref 0–40)
Albumin: 4.6 g/dL (ref 3.5–4.8)
Alkaline Phosphatase: 69 IU/L (ref 39–117)
BUN/Creatinine Ratio: 12 (ref 12–28)
BUN: 9 mg/dL (ref 8–27)
Bilirubin Total: 0.6 mg/dL (ref 0.0–1.2)
CALCIUM: 9.9 mg/dL (ref 8.7–10.3)
CO2: 20 mmol/L (ref 20–29)
CREATININE: 0.78 mg/dL (ref 0.57–1.00)
Chloride: 85 mmol/L — ABNORMAL LOW (ref 96–106)
GFR calc Af Amer: 88 mL/min/{1.73_m2} (ref >59)
GFR, EST NON AFRICAN AMERICAN: 76 mL/min/{1.73_m2} (ref >59)
GLUCOSE: 95 mg/dL (ref 65–99)
Globulin, Total: 2.3 g/dL (ref 1.5–4.5)
Potassium: 4.1 mmol/L (ref 3.5–5.2)
Sodium: 125 mmol/L — ABNORMAL LOW (ref 134–144)
TOTAL PROTEIN: 6.9 g/dL (ref 6.0–8.5)

## 2017-06-06 LAB — LIPID PANEL
CHOL/HDL RATIO: 2.7 ratio (ref 0.0–4.4)
Cholesterol, Total: 222 mg/dL — ABNORMAL HIGH (ref 100–199)
HDL: 81 mg/dL (ref >39)
LDL CALC: 113 mg/dL — AB (ref 0–99)
TRIGLYCERIDES: 140 mg/dL (ref 0–149)
VLDL CHOLESTEROL CAL: 28 mg/dL (ref 5–40)

## 2017-06-06 MED ORDER — CARISOPRODOL 350 MG PO TABS: 350.0000 mg | ORAL_TABLET | Freq: Three times a day (TID) | ORAL | 0 refills | Status: DC | PRN

## 2017-06-06 NOTE — Telephone Encounter (Signed)
Notified pt rx has been faxed to pharmacy...[name]

## 2017-06-06 NOTE — Telephone Encounter (Signed)
Medication printed and to be faxed.

## 2017-06-10 ENCOUNTER — Telehealth: Payer: Self-pay | Admitting: *Deleted

## 2017-06-10 NOTE — Telephone Encounter (Addendum)
-----   Message from [name], MD sent at 06/07/2017  1:15 AM EDT ----- Cholesterol level shows overall poor cholesterol control with total cholesterol of 223. The LDL (bad cholesterol is 113)  With aortic valve disease and aortic disease, we would like for the cholesterol level to be better controlled and the LDL to be closer to 70, but at least less than 100.  We can discuss options when I see her in follow-up.  Her sodium level is quite low -- need to stop the HCTZ-Irbesartan & just use irbesartan.  Will need to check BMP early next week to ensure that the level is still low.    [name], MD   Left message for pt to call

## 2017-06-12 ENCOUNTER — Other Ambulatory Visit: Payer: Self-pay | Admitting: Cardiology

## 2017-06-12 DIAGNOSIS — I498 Other specified cardiac arrhythmias: Secondary | ICD-10-CM

## 2017-06-12 DIAGNOSIS — R42 Dizziness and giddiness: Secondary | ICD-10-CM

## 2017-06-12 DIAGNOSIS — R0602 Shortness of breath: Secondary | ICD-10-CM

## 2017-06-16 ENCOUNTER — Ambulatory Visit (HOSPITAL_COMMUNITY): Payer: Medicare Other | Attending: Cardiovascular Disease

## 2017-06-16 ENCOUNTER — Ambulatory Visit (INDEPENDENT_AMBULATORY_CARE_PROVIDER_SITE_OTHER): Payer: Medicare Other

## 2017-06-16 ENCOUNTER — Other Ambulatory Visit: Payer: Self-pay | Admitting: Cardiology

## 2017-06-16 ENCOUNTER — Other Ambulatory Visit: Payer: Self-pay

## 2017-06-16 DIAGNOSIS — I5030 Unspecified diastolic (congestive) heart failure: Secondary | ICD-10-CM | POA: Diagnosis not present

## 2017-06-16 DIAGNOSIS — R0602 Shortness of breath: Secondary | ICD-10-CM

## 2017-06-16 DIAGNOSIS — I712 Thoracic aortic aneurysm, without rupture, unspecified: Secondary | ICD-10-CM

## 2017-06-16 DIAGNOSIS — I350 Nonrheumatic aortic (valve) stenosis: Secondary | ICD-10-CM | POA: Diagnosis not present

## 2017-06-16 DIAGNOSIS — I498 Other specified cardiac arrhythmias: Secondary | ICD-10-CM

## 2017-06-16 DIAGNOSIS — I069 Rheumatic aortic valve disease, unspecified: Secondary | ICD-10-CM | POA: Insufficient documentation

## 2017-06-16 DIAGNOSIS — R42 Dizziness and giddiness: Secondary | ICD-10-CM | POA: Diagnosis not present

## 2017-06-16 LAB — ECHOCARDIOGRAM LIMITED
AV Area VTI index: 0.96 cm2/m2
AV Area VTI: 1.51 cm2
AV Mean grad: 10 mmHg
AV Peak grad: 21 mmHg
AV VEL mean LVOT/AV: 0.43
AV peak Index: 0.9
AV pk vel: 231 cm/s
AV vel: 1.61
AVAREAMEANV: 1.36 cm2
AVAREAMEANVIN: 0.81 cm2/m2
Ao pk vel: 0.48 m/s
CHL CUP AV VALUE AREA INDEX: 0.96
CHL CUP DOP CALC LVOT VTI: 23.7 cm
CHL CUP RV SYS PRESS: 24 mmHg
DOP CAL AO MEAN VELOCITY: 149 cm/s
E decel time: 285 msec
EERAT: 7.76
FS: 39 % (ref 28–44)
IV/PV OW: 0.98
LA diam end sys: 30 mm
LA vol A4C: 34.5 ml
LA vol index: 22.8 mL/m2
LADIAMINDEX: 1.8 cm/m2
LASIZE: 30 mm
LAVOL: 38.1 mL
LV E/e'average: 7.76
LVEEMED: 7.76
LVELAT: 8.18 cm/s
LVOT SV: 74 mL
LVOT area: 3.14 cm2
LVOT peak VTI: 0.51 cm
LVOT peak vel: 111 cm/s
LVOTD: 20 mm
MV Dec: 285
MVPKAVEL: 86.8 m/s
MVPKEVEL: 63.5 m/s
PW: 9.19 mm — AB (ref 0.6–1.1)
RV LATERAL S' VELOCITY: 11.3 cm/s
RV TAPSE: 22.3 mm
Reg peak vel: 228 cm/s
TDI e' lateral: 8.18
TDI e' medial: 4.2
TR max vel: 228 cm/s
VTI: 46.1 cm
Valve area: 1.61 cm2

## 2017-06-19 ENCOUNTER — Encounter: Payer: Self-pay | Admitting: Internal Medicine

## 2017-06-19 ENCOUNTER — Other Ambulatory Visit: Payer: Self-pay | Admitting: *Deleted

## 2017-06-19 DIAGNOSIS — I712 Thoracic aortic aneurysm, without rupture, unspecified: Secondary | ICD-10-CM

## 2017-06-19 DIAGNOSIS — I350 Nonrheumatic aortic (valve) stenosis: Secondary | ICD-10-CM

## 2017-06-19 NOTE — Progress Notes (Unsigned)
ORDER PLACED PER  DR HARDING REVIEW OF ECHO 05/2017

## 2017-06-20 ENCOUNTER — Other Ambulatory Visit: Payer: Self-pay | Admitting: Family

## 2017-06-20 ENCOUNTER — Telehealth: Payer: Self-pay | Admitting: *Deleted

## 2017-06-20 ENCOUNTER — Other Ambulatory Visit: Payer: Self-pay | Admitting: Internal Medicine

## 2017-06-20 DIAGNOSIS — Z79899 Other long term (current) drug therapy: Secondary | ICD-10-CM

## 2017-06-20 MED ORDER — IRBESARTAN 300 MG PO TABS: 300.0000 mg | ORAL_TABLET | Freq: Every day | ORAL | 0 refills | Status: DC

## 2017-06-20 NOTE — Telephone Encounter (Signed)
The patient has been made aware of the results. She has been instructed to stop the irbesartan-hctz and start irbesartan 300 mg tablet daily. She has also been instructed to come in next week for a repeat lab draw. She verbalized her understanding and the orders have been placed.  Notes recorded by [name], MD on 06/07/2017 at 1:15 AM EDT Cholesterol level shows overall poor cholesterol control with total cholesterol of 223. The LDL (bad cholesterol is 113)  With aortic valve disease and aortic disease, we would like for the cholesterol level to be better controlled and the LDL to be closer to 70, but at least less than 100.  We can discuss options when I see her in follow-up.  Her sodium level is quite low -- need to stop the HCTZ-Irbesartan & just use irbesartan. Will need to check BMP early next week to ensure that the level is still low.

## 2017-06-20 NOTE — Telephone Encounter (Signed)
[facility] on [facility]17.[name] KitchenJohny Chess

## 2017-06-20 NOTE — Telephone Encounter (Signed)
See telephone note from today

## 2017-06-23 NOTE — Telephone Encounter (Signed)
Faxed

## 2017-06-23 NOTE — Telephone Encounter (Signed)
Last filled on 06/03/17 per [facility]

## 2017-06-24 DIAGNOSIS — H40013 Open angle with borderline findings, low risk, bilateral: Secondary | ICD-10-CM | POA: Diagnosis not present

## 2017-06-24 DIAGNOSIS — H40052 Ocular hypertension, left eye: Secondary | ICD-10-CM | POA: Diagnosis not present

## 2017-06-24 DIAGNOSIS — H04123 Dry eye syndrome of bilateral lacrimal glands: Secondary | ICD-10-CM | POA: Diagnosis not present

## 2017-06-26 DIAGNOSIS — Z79899 Other long term (current) drug therapy: Secondary | ICD-10-CM | POA: Diagnosis not present

## 2017-06-26 LAB — BASIC METABOLIC PANEL
BUN / CREAT RATIO: 13 (ref 12–28)
BUN: 9 mg/dL (ref 8–27)
CHLORIDE: 96 mmol/L (ref 96–106)
CO2: 23 mmol/L (ref 20–29)
Calcium: 9.2 mg/dL (ref 8.7–10.3)
Creatinine, Ser: 0.69 mg/dL (ref 0.57–1.00)
GFR calc Af Amer: 101 mL/min/{1.73_m2} (ref >59)
GFR calc non Af Amer: 87 mL/min/{1.73_m2} (ref >59)
GLUCOSE: 75 mg/dL (ref 65–99)
POTASSIUM: 5 mmol/L (ref 3.5–5.2)
SODIUM: 135 mmol/L (ref 134–144)

## 2017-06-28 NOTE — Telephone Encounter (Signed)
So - it looks like HCTZ is not a favorable option for Marissa [name] -- after stopping this component, the sodium level has normalized. I have updated the Allergy tab.  Marissa [name]

## 2017-06-30 ENCOUNTER — Other Ambulatory Visit: Payer: Self-pay | Admitting: Internal Medicine

## 2017-06-30 ENCOUNTER — Encounter: Payer: Self-pay | Admitting: Internal Medicine

## 2017-06-30 ENCOUNTER — Other Ambulatory Visit: Payer: Self-pay | Admitting: Family

## 2017-07-01 NOTE — Telephone Encounter (Signed)
Rx faxed

## 2017-07-01 NOTE — Telephone Encounter (Signed)
Last refill was 06/06/17 per [facility] with 20 tablets

## 2017-07-02 NOTE — Telephone Encounter (Signed)
I'm not sure what refills need approved. Eszopiclone just filled.

## 2017-07-10 NOTE — Progress Notes (Signed)
Subjective:   Marissa [name] is a 71 y.o. female who presents for Medicare Annual (Subsequent) preventive examination.  Review of Systems:  No ROS.  Medicare Wellness Visit. Additional risk factors are reflected in the social history.  Cardiac Risk Factors include: advanced age (>61mn, >>61women);hypertension Sleep patterns: feels rested on waking, gets up 2 times nightly to void and sleeps 6-7 hours nightly.    Home Safety/Smoke Alarms: Feels safe in home. Smoke alarms in place.  Living environment; residence and Firearm Safety: 2-story house, no firearms, Lives with husband, no needs for DME, good support system. Seat Belt Safety/Bike Helmet: Wears seat belt.       Objective:     Vitals: BP (!) 142/88   Pulse 76   Resp 20   Ht 5' 4"  (1.626 m)   Wt 137 lb (62.1 kg)   SpO2 98%   BMI 23.52 kg/m   Body mass index is 23.52 kg/m.   Tobacco History  Smoking Status  . Never Smoker  Smokeless Tobacco  . Never Used     Counseling given: Not Answered   Past Medical History:  Diagnosis Date  . Arthritis   . Asthma   . Bicuspid aortic valve   . Bursitis   . Cataract    mild  . Dysthymia   . Dysthymia   . Heart valve regurgitation   . Hemorrhoids   . Hx of ulcerative colitis    per dr jArnoldo Moraleas per pt.  . Hyperlipidemia   . Hypertension    managed - labile.  . Migraine   . MVP (mitral valve prolapse)    Not seen on Echo June 2018  . Scoliosis   . 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 Spinal stenosis   . Thoracic aortic aneurysm (HCC)    Stable 4.2-4.3 cm (followed by Dr. HRoxan Hockey  . Thyroid disease    Past Surgical History:  Procedure Laterality Date  . ABDOMINAL HYSTERECTOMY    . BUNIONECTOMY    . COLONOSCOPY  02-04-2005   all normal   . FOOT SURGERY     3 pins in toes   . HAND SURGERY     left thumb joint resection  .  nasal revision    . SINUS IRRIGATION    . TONSILLECTOMY AND ADENOIDECTOMY    . TRANSTHORACIC ECHOCARDIOGRAM  03/2017   Moderate LVH. Normal EF 60-65%. Normal diastolic parameters. --> Difficult to fully visualize the aortic valve. Cannot exclude bicuspid valve. Mild aortic stenosis noted. No PFO. Mildly dilated left atrium. Trivial MR. No comment on mitral valve prolapse. Moderately dilated ascending aorta.   Family History  Problem Relation Age of Onset  . Arthritis Mother   . Leukemia Father   . Heart disease Maternal Uncle   . Breast cancer Paternal Aunt   . Diabetes Paternal Aunt   . Breast cancer Maternal Grandmother   . Heart disease Maternal Grandfather   . Breast cancer Paternal Grandmother   . Colon cancer Neg Hx    History  Sexual Activity  . Sexual activity: Not on file    Outpatient Encounter Prescriptions as of 07/14/2017  Medication Sig  . albuterol (PROAIR HFA) 108 (90 Base) MCG/ACT inhaler INHALE 2 PUFFS INTO THE LUNGS EVERY 6 (SIX) HOURS AS NEEDED.  . carisoprodol (SOMA) 350 MG tablet TAKE 1  TABLET BY MOUTH THREE TIMES DAILY AS NEEDED  . cycloSPORINE (RESTASIS) 0.05 % ophthalmic emulsion Place 1 drop into both eyes 2 (two) times daily.  . Eszopiclone 3 MG TABS TAKE 1 TABLET BY MOUTH EVERY NIGHT AT BEDTIME TIME  . famotidine (PEPCID) 20 MG tablet Take 20 mg by mouth at bedtime.  [name] Kitchen FLUoxetine (PROZAC) 40 MG capsule Take 1 capsule (40 mg total) by mouth 2 (two) times daily. Keep appt for future refills  . irbesartan (AVAPRO) 300 MG tablet Take 1 tablet (300 mg total) by mouth daily.  [name] Kitchen levothyroxine (SYNTHROID, LEVOTHROID) 125 MCG tablet Take 1 tablet (125 mcg total) by mouth daily. NEED ANNUAL APPOINTMENT FOR FURTHER REFILLS  . meperidine (DEMEROL) 50 MG tablet Take 1 tablet (50 mg total) by mouth every 6 (six) hours as needed for severe pain. One every 4 hours if coughing  . MINIVELLE 0.05 MG/24HR patch Place 1 patch onto the skin 2 (two) times a week.   . mirabegron  ER (MYRBETRIQ) 50 MG TB24 tablet Take 1 tablet (50 mg total) by mouth daily. Annual appt due in September must see MD for refills  . montelukast (SINGULAIR) 10 MG tablet Take 1 tablet (10 mg total) by mouth at bedtime.  [name] Kitchen omeprazole (PRILOSEC) 20 MG capsule Take 20 mg by mouth daily before breakfast.  . Respiratory Therapy Supplies (FLUTTER) DEVI Use as directed  . verapamil (CALAN-SR) 240 MG CR tablet Take 1 tablet (240 mg total) by mouth at bedtime.  . [DISCONTINUED] levothyroxine (SYNTHROID, LEVOTHROID) 125 MCG tablet TAKE 1 TABLET(125 MCG) BY MOUTH DAILY   No facility-administered encounter medications on file as of 07/14/2017.     Activities of Daily Living In your present state of health, do you have any difficulty performing the following activities: 07/14/2017  Hearing? N  Vision? N  Difficulty concentrating or making decisions? N  Walking or climbing stairs? N  Dressing or bathing? N  Doing errands, shopping? N  Preparing Food and eating ? N  Using the Toilet? N  In the past six months, have you accidently leaked urine? Y  Do you have problems with loss of bowel control? N  Managing your Medications? N  Managing your Finances? N  Housekeeping or managing your Housekeeping? N  Some recent data might be hidden    Patient Care Team: [name], MD as PCP - General (Internal Medicine) Melrose Nakayama, MD as Consulting Physician (Cardiothoracic Surgery) [name], MD as Consulting Physician (Cardiology) Tanda Rockers, MD as Consulting Physician (Pulmonary Disease)    Assessment:    Physical assessment deferred to PCP.  Exercise Activities and Dietary recommendations Current Exercise Habits: Home exercise routine, Type of exercise: yoga;strength training/weights, Time (Minutes): 30, Frequency (Times/Week): 5, Weekly Exercise (Minutes/Week): 150, Intensity: Mild, Exercise limited by: orthopedic condition(s)  Diet (meal preparation, eat out, water  intake, caffeinated beverages, dairy products, fruits and vegetables): in general, a "healthy" diet  , well balanced, eats a variety of fruits and vegetables daily, limits salt, fat/cholesterol, sugar, caffeine, drinks 6-8 glasses of water daily.    Goals    . Stay as healthy as possible          Focus on my health, stay active, enjoy life, be active in my grand-child's life, and grow old with my husband.       Fall Risk Fall Risk  07/14/2017 03/28/2016 06/20/2015  Falls in the past year? Yes Yes No  Number falls in past yr: 2 or  more 2 or more -  Injury with Fall? - Yes -  Risk for fall due to : Impaired mobility - -  Follow up Falls prevention discussed;Education provided - -   Depression Screen PHQ 2/9 Scores 07/14/2017 03/28/2016 06/20/2015  PHQ - 2 Score 2 0 3  PHQ- 9 Score 5 - 12     Cognitive Function MMSE - Mini Mental State Exam 07/14/2017 06/20/2015  Not completed: - Unable to complete  Orientation to time 5 -  Orientation to Place 5 -  Registration 3 -  Attention/ Calculation 5 -  Recall 1 -  Language- name 2 objects 2 -  Language- repeat 1 -  Language- follow 3 step command 3 -  Language- read & follow direction 1 -  Write a sentence 1 -  Copy design 1 -  Total score 28 -        Immunization History  Administered Date(s) Administered  . Influenza Split 09/12/2011, 08/19/2012  . Influenza Whole 08/20/2007, 07/29/2008, 08/07/2009, 07/25/2010  . Influenza, High Dose Seasonal PF 07/09/2016, 07/14/2017  . Influenza,inj,Quad PF,6+ Mos 07/19/2013, 08/16/2014, 07/13/2015  . Pneumococcal Conjugate-13 01/06/2015  . Pneumococcal Polysaccharide-23 07/19/2013  . Tdap 07/29/2011  . Zoster 10/28/2012   Screening Tests Health Maintenance  Topic Date Due  . INFLUENZA VACCINE  05/28/2017  . MAMMOGRAM  05/09/2019  . TETANUS/TDAP  07/28/2021  . COLONOSCOPY  02/27/2025  . DEXA SCAN  Completed  . Hepatitis C Screening  Completed  . PNA vac Low Risk Adult  Completed        Plan:     Continue doing brain stimulating activities (puzzles, reading, adult coloring books, staying active) to keep memory sharp.   Continue to eat heart healthy diet (full of fruits, vegetables, whole grains, lean protein, water--limit salt, fat, and sugar intake) and increase physical activity as tolerated.  I have personally reviewed and noted the following in the patient's chart:   . Medical and social history . Use of alcohol, tobacco or illicit drugs  . Current medications and supplements . Functional ability and status . Nutritional status . Physical activity . Advanced directives . List of other physicians . Vitals . Screenings to include cognitive, depression, and falls . Referrals and appointments  In addition, I have reviewed and discussed with patient certain preventive protocols, quality metrics, and best practice recommendations. A written personalized care plan for preventive services as well as general preventive health recommendations were provided to patient.     Michiel Cowboy, RN  07/14/2017

## 2017-07-10 NOTE — Progress Notes (Signed)
Pre visit review using our clinic review tool, if applicable. No additional management support is needed unless otherwise documented below in the visit note. 

## 2017-07-12 ENCOUNTER — Other Ambulatory Visit: Payer: Self-pay | Admitting: Internal Medicine

## 2017-07-14 ENCOUNTER — Ambulatory Visit (INDEPENDENT_AMBULATORY_CARE_PROVIDER_SITE_OTHER): Payer: Medicare Other | Admitting: *Deleted

## 2017-07-14 VITALS — BP 142/88 | HR 76 | Resp 20 | Ht 64.0 in | Wt 137.0 lb

## 2017-07-14 DIAGNOSIS — Z23 Encounter for immunization: Secondary | ICD-10-CM

## 2017-07-14 DIAGNOSIS — Z0000 Encounter for general adult medical examination without abnormal findings: Secondary | ICD-10-CM

## 2017-07-14 NOTE — Progress Notes (Signed)
Medical screening examination/treatment/procedure(s) were performed by non-physician practitioner and as supervising physician I was immediately available for consultation/collaboration. I agree with above. Elizabeth A Crawford, MD 

## 2017-07-14 NOTE — Patient Instructions (Addendum)
Continue doing brain stimulating activities (puzzles, reading, adult coloring books, staying active) to keep memory sharp.   Continue to eat heart healthy diet (full of fruits, vegetables, whole grains, lean protein, water--limit salt, fat, and sugar intake) and increase physical activity as tolerated.   Marissa [name] , Thank you for taking time to come for your Medicare Wellness Visit. I appreciate your ongoing commitment to your health goals. Please review the following plan we discussed and let me know if I can assist you in the future.   These are the goals we discussed: Goals    . Stay as healthy as possible          Focus on my health, stay active, enjoy life, be active in my grand-child's life, and grow old with my husband.        This is a list of the screening recommended for you and due dates:  Health Maintenance  Topic Date Due  . Flu Shot  05/28/2017  . Mammogram  05/09/2019  . Tetanus Vaccine  07/28/2021  . Colon Cancer Screening  02/27/2025  . DEXA scan (bone density measurement)  Completed  .  Hepatitis C: One time screening is recommended by Center for Disease Control  (CDC) for  adults born from [birth year] through 1966.   Completed  . Pneumonia vaccines  Completed   Influenza Virus Vaccine injection What is this medicine? INFLUENZA VIRUS VACCINE (in floo EN zuh VAHY ruhs vak SEEN) helps to reduce the risk of getting influenza also known as the flu. The vaccine only helps protect you against some strains of the flu. This medicine may be used for other purposes; ask your health care provider or pharmacist if you have questions. COMMON BRAND NAME(S): Afluria, Agriflu, Alfuria, FLUAD, Fluarix, Fluarix Quadrivalent, Flublok, Flublok Quadrivalent, FLUCELVAX, Flulaval, Fluvirin, Fluzone, Fluzone High-Dose, Fluzone Intradermal What should I tell my health care provider before I take this medicine? They need to know if you have any of these conditions: -bleeding disorder like  hemophilia -fever or infection -Guillain-Barre syndrome or other neurological problems -immune system problems -infection with the human immunodeficiency virus (HIV) or AIDS -low blood platelet counts -multiple sclerosis -an unusual or allergic reaction to influenza virus vaccine, latex, other medicines, foods, dyes, or preservatives. Different brands of vaccines contain different allergens. Some may contain latex or eggs. Talk to your doctor about your allergies to make sure that you get the right vaccine. -pregnant or trying to get pregnant -breast-feeding How should I use this medicine? This vaccine is for injection into a muscle or under the skin. It is given by a health care professional. A copy of Vaccine Information Statements will be given before each vaccination. Read this sheet carefully each time. The sheet may change frequently. Talk to your healthcare provider to see which vaccines are right for you. Some vaccines should not be used in all age groups. Overdosage: If you think you have taken too much of this medicine contact a poison control center or emergency room at once. NOTE: This medicine is only for you. Do not share this medicine with others. What if I miss a dose? This does not apply. What may interact with this medicine? -chemotherapy or radiation therapy -medicines that lower your immune system like etanercept, anakinra, infliximab, and adalimumab -medicines that treat or prevent blood clots like warfarin -phenytoin -steroid medicines like prednisone or cortisone -theophylline -vaccines This list may not describe all possible interactions. Give your health care provider a list of all the  medicines, herbs, non-prescription drugs, or dietary supplements you use. Also tell them if you smoke, drink alcohol, or use illegal drugs. Some items may interact with your medicine. What should I watch for while using this medicine? Report any side effects that do not go away  within 3 days to your doctor or health care professional. Call your health care provider if any unusual symptoms occur within 6 weeks of receiving this vaccine. You may still catch the flu, but the illness is not usually as bad. You cannot get the flu from the vaccine. The vaccine will not protect against colds or other illnesses that may cause fever. The vaccine is needed every year. What side effects may I notice from receiving this medicine? Side effects that you should report to your doctor or health care professional as soon as possible: -allergic reactions like skin rash, itching or hives, swelling of the face, lips, or tongue Side effects that usually do not require medical attention (report to your doctor or health care professional if they continue or are bothersome): -fever -headache -muscle aches and pains -pain, tenderness, redness, or swelling at the injection site -tiredness This list may not describe all possible side effects. Call your doctor for medical advice about side effects. You may report side effects to FDA at 1-800-FDA-1088. Where should I keep my medicine? The vaccine will be given by a health care professional in a clinic, pharmacy, doctor's office, or other health care setting. You will not be given vaccine doses to store at home. NOTE: This sheet is a summary. It may not cover all possible information. If you have questions about this medicine, talk to your doctor, pharmacist, or health care provider.  2018 Elsevier/Gold Standard (2015-05-05 10:07:28)   It is important to avoid accidents which may result in broken bones.  Here are a few ideas on how to make your home safer so you will be less likely to trip or fall.  1. Use nonskid mats or non slip strips in your shower or tub, on your bathroom floor and around sinks.  If you know that you have spilled water, wipe it up! 2. In the bathroom, it is important to have properly installed grab bars on the walls or on the  edge of the tub.  Towel racks are NOT strong enough for you to hold onto or to pull on for support. 3. Stairs and hallways should have enough light.  Add lamps or night lights if you need ore light. 4. It is good to have handrails on both sides of the stairs if possible.  Always fix broken handrails right away. 5. It is important to see the edges of steps.  Paint the edges of outdoor steps white so you can see them better.  Put colored tape on the edge of inside steps. 6. Throw-rugs are dangerous because they can slide.  Removing the rugs is the best idea, but if they must stay, add adhesive carpet tape to prevent slipping. 7. Do not keep things on stairs or in the halls.  Remove small furniture that blocks the halls as it may cause you to trip.  Keep telephone and electrical cords out of the way where you walk. 8. Always were sturdy, rubber-soled shoes for good support.  Never wear just socks, especially on the stairs.  Socks may cause you to slip or fall.  Do not wear full-length housecoats as you can easily trip on the bottom.  9. Place the things you use the  most on the shelves that are the easiest to reach.  If you use a stepstool, make sure it is in good condition.  If you feel unsteady, DO NOT climb, ask for help. 10. If a health professional advises you to use a cane or walker, do not be ashamed.  These items can keep you from falling and breaking your bones.

## 2017-07-28 HISTORY — PX: NM MYOVIEW LTD: HXRAD82

## 2017-07-28 HISTORY — PX: OTHER SURGICAL HISTORY: SHX169

## 2017-07-29 ENCOUNTER — Other Ambulatory Visit: Payer: Self-pay | Admitting: Family

## 2017-07-29 MED ORDER — ESZOPICLONE 3 MG PO TABS: 3.0000 mg | ORAL_TABLET | Freq: Every day | ORAL | 5 refills | Status: DC

## 2017-08-01 ENCOUNTER — Ambulatory Visit (INDEPENDENT_AMBULATORY_CARE_PROVIDER_SITE_OTHER): Payer: Medicare Other | Admitting: Cardiology

## 2017-08-01 ENCOUNTER — Encounter: Payer: Self-pay | Admitting: Cardiology

## 2017-08-01 VITALS — BP 166/90 | HR 70 | Ht 64.0 in | Wt 138.0 lb

## 2017-08-01 DIAGNOSIS — R0609 Other forms of dyspnea: Secondary | ICD-10-CM | POA: Diagnosis not present

## 2017-08-01 DIAGNOSIS — I10 Essential (primary) hypertension: Secondary | ICD-10-CM

## 2017-08-01 DIAGNOSIS — I350 Nonrheumatic aortic (valve) stenosis: Secondary | ICD-10-CM | POA: Diagnosis not present

## 2017-08-01 DIAGNOSIS — I498 Other specified cardiac arrhythmias: Secondary | ICD-10-CM

## 2017-08-01 DIAGNOSIS — R079 Chest pain, unspecified: Secondary | ICD-10-CM | POA: Diagnosis not present

## 2017-08-01 DIAGNOSIS — R0600 Dyspnea, unspecified: Secondary | ICD-10-CM

## 2017-08-01 DIAGNOSIS — I712 Thoracic aortic aneurysm, without rupture, unspecified: Secondary | ICD-10-CM

## 2017-08-01 MED ORDER — NEBIVOLOL HCL 5 MG PO TABS: 5.0000 mg | ORAL_TABLET | Freq: Every day | ORAL | 6 refills | Status: DC

## 2017-08-01 MED ORDER — NEBIVOLOL HCL 5 MG PO TABS: 5.0000 mg | ORAL_TABLET | Freq: Every day | ORAL | 0 refills | Status: DC

## 2017-08-01 NOTE — Progress Notes (Addendum)
PCP: [name], MD  Clinic Note: Chief Complaint  Patient presents with  . Follow-up    2 months: Hypertension, thoracic aortic aneurysm, bicuspid aortic valve  . Shortness of Breath  . Headache  . Edema    Hands  . Chest Pain    HPI:  Marissa [name] is a 71 y.o. female with a history of what seems like bicuspid aortic valve and hypertension as well as thoracic aortic aneurysm who is seen for follow-up after initial evaluation for chest discomfort and dyspnea. She also noted palpitations. -- Her original primary doctor had difficulty managing her hypertension having to use valsartan or verapamil because of intolerance of medications. Along with hypertension, she has hyperlipidemia, ulcerative colitis, scoliosis, spinal stenosis as well as asthma and arthritis. There is also suggestive history of mitral prolapse.  She had been followed by Dr. Roxan Hockey for an ascending aortic aneurysm and heart murmur with loud click. The aneurysm has been followed since 2012, and her aneurysm has remained stable at the 4.2-4.3 cm level.   Marissa [name] was seen   by Dr. Roxan Hockey recently on July 31 for follow-up to discuss results her echocardiogram and MR angiogram that showed stable aneurysm. She is noted to have a high blood pressures that have been variable.  During that visit, she noted having episodes chest pain lasting about 2 hours for which she did not go to the emergency room. She had significantly older blood pressure that time, has indicated her blood pressures been up and down over the last several weeks to a month and half. She is not had further symptoms since that time according to him.  Recent Hospitalizations: None  Studies Personally Reviewed - (if available, images/films reviewed: From Epic Chart or Care Everywhere)  Event Monitor. Overall relatively unremarkable findings.  Mostly NSR -  rare bradycardia and tachycardia. Rates ranged from roughly 55-110 bpm  with average rate of 68 bpm  The patient had symptoms with sinus rhythm, sinus bradycardia, sinus tachycardia as well as just about every PAC and PVC.  There were rare PAC couplets and at least 2 short 4 beat runs of PACs. Overall very infrequent PACs (some couplets), all symptomatic  Rare PVCs all isolated, occasionally associated with PACs. All symptomatic  No arrhythmias other than short PAT runs of less than <5 beats.  06/16/2017 - F/u Echo To evaluate the aortic valve. -- Bicuspid AoV - mildly thickened / calcified. - No stenosis: Mean and peak gradients: 10 mmHg/21 mmHg  Interval History: [name] returns for further evaluation. She indicates she's had persistently elevated blood pressures as high as the 190s to 110 range. She also continues to note palpitations. She basically felt every single ectopic beat noted on the monitor. She describes more pronounced deep aching in her chest off and on throughout the day, oftentimes made worse with exertion. She had not mentioned that as much in the past. She thought it was more related to having difficulty taking a deep breath, but seems to be more exertional along with dyspnea. She did note some more than usual dyspnea going up steps or vacuuming during her last visit.  Although she notes the irregular heartbeats, she denies any prolonged cessation of arrhythmias which correlates with the findings on the monitor. She denies any syncope or near syncope, but does get somewhat dizzy and her blood pressures are elevated. No TIA or amaurosis fugax symptoms. No melena, hematochezia or hematuria. No epistaxis. No claudication.  ROS: A comprehensive was performed. Review of Systems  Constitutional: Positive for malaise/fatigue. Negative for chills and fever.  HENT: Negative for congestion and nosebleeds.   Respiratory: Positive for shortness of breath. Negative for cough and wheezing.   Cardiovascular: Positive for chest pain (Per history of present  illness).  Gastrointestinal: Negative for blood in stool.  Genitourinary: Negative for dysuria and hematuria.  Musculoskeletal: Positive for joint pain and myalgias.       Chronic fatigue and fibromyalgia pains  Skin: Negative.   Neurological: Positive for dizziness and headaches. Negative for focal weakness and loss of consciousness.  Psychiatric/Behavioral: The patient is nervous/anxious.   All other systems reviewed and are negative.   I have reviewed and (if needed) personally updated the patient's problem list, medications, allergies, past medical and surgical history, social and family history.   Past Medical History:  Diagnosis Date  . Arthritis   . Asthma   . Bicuspid aortic valve   . Bursitis   . Cataract    mild  . Dysthymia   . Dysthymia   . Heart valve regurgitation   . Hemorrhoids   . Hx of ulcerative colitis    per dr Arnoldo Morale as per pt.  . Hyperlipidemia   . Hypertension    managed - labile.  . Migraine   . MVP (mitral valve prolapse)    Not seen on Echo June 2018  . Scoliosis   . 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 Spinal stenosis   . Thoracic aortic aneurysm (HCC)    Stable 4.2-4.3 cm (followed by Dr. Roxan Hockey)  . Thyroid disease     Past Surgical History:  Procedure Laterality Date  . ABDOMINAL HYSTERECTOMY    . BUNIONECTOMY    . COLONOSCOPY  02-04-2005   all normal   . FOOT SURGERY     3 pins in toes   . HAND SURGERY     left thumb joint resection  . nasal revision    . SINUS IRRIGATION    . TONSILLECTOMY AND ADENOIDECTOMY    . TRANSTHORACIC ECHOCARDIOGRAM  03/2017   Moderate LVH. Normal EF 60-65%. Normal diastolic parameters. --> Difficult to fully visualize the aortic valve. Cannot exclude bicuspid valve. Mild aortic stenosis noted. No PFO. Mildly dilated left atrium. Trivial MR. No comment on mitral valve prolapse.  Moderately dilated ascending aorta.    2-D Echocardiogram 04/08/2017: Moderate LVH. Normal EF 60-65%. Normal diastolic parameters. --> Difficult to fully visualize the aortic valve. Cannot exclude bicuspid valve. Mild aortic stenosis noted. No PFO. Mildly dilated left atrium. Trivial MR. No comment on mitral valve prolapse. Moderately dilated ascending aorta.  Recommendation was to consider repeat dedicated echo to evaluate aortic valve versus TEE.  Current Meds  Medication Sig  . albuterol (PROAIR HFA) 108 (90 Base) MCG/ACT inhaler INHALE 2 PUFFS INTO THE LUNGS EVERY 6 (SIX) HOURS AS NEEDED.  . carisoprodol (SOMA) 350 MG tablet TAKE 1 TABLET BY MOUTH THREE TIMES DAILY AS NEEDED  . cycloSPORINE (RESTASIS) 0.05 % ophthalmic emulsion Place 1 drop into both eyes 2 (two) times daily.  . Eszopiclone 3 MG TABS Take 1 tablet (3 mg total) by mouth at bedtime. Take immediately before bedtime  . famotidine (PEPCID) 20 MG tablet Take 20 mg by mouth at bedtime.  [name] Kitchen FLUoxetine (PROZAC) 40 MG capsule Take 1 capsule (40 mg  total) by mouth 2 (two) times daily. Keep appt for future refills  . irbesartan (AVAPRO) 300 MG tablet Take 1 tablet (300 mg total) by mouth daily.  [name] Kitchen levothyroxine (SYNTHROID, LEVOTHROID) 125 MCG tablet Take 1 tablet (125 mcg total) by mouth daily. NEED ANNUAL APPOINTMENT FOR FURTHER REFILLS  . meperidine (DEMEROL) 50 MG tablet Take 1 tablet (50 mg total) by mouth every 6 (six) hours as needed for severe pain. One every 4 hours if coughing  . MINIVELLE 0.05 MG/24HR patch Place 1 patch onto the skin 2 (two) times a week.   . mirabegron ER (MYRBETRIQ) 50 MG TB24 tablet Take 1 tablet (50 mg total) by mouth daily. Annual appt due in September must see MD for refills  . montelukast (SINGULAIR) 10 MG tablet Take 1 tablet (10 mg total) by mouth at bedtime.  [name] Kitchen omeprazole (PRILOSEC) 20 MG capsule Take 20 mg by mouth daily before breakfast.  . Respiratory Therapy Supplies (FLUTTER) DEVI Use as  directed  . verapamil (CALAN-SR) 240 MG CR tablet Take 1 tablet (240 mg total) by mouth at bedtime.    Allergies  Allergen Reactions  . Fentanyl Shortness Of Breath and Rash  . Amoxicillin Diarrhea    Severe diarrhea, rash to vaginal area with swelling   . Hctz [Hydrochlorothiazide] Other (See Comments)    HypoNatremia  . Codeine Other (See Comments)    makes her hyper  . Conjugated Estrogens Itching and Rash  . Erythromycin Rash    had a rash with emycin, has done ok with other meds in it's class  . Piroxicam Itching and Rash    Feldene    Social History   Social History  . Marital status: Married    Spouse name: N/A  . Number of children: N/A  . Years of education: N/A   Social History Main Topics  . Smoking status: Never Smoker  . Smokeless tobacco: Never Used  . Alcohol use 0.0 oz/week     Comment: occ   . Drug use: No  . Sexual activity: Not Asked   Other Topics Concern  . None   Social History Narrative  . None    family history includes Arthritis in her mother; Breast cancer in her maternal grandmother, paternal aunt, and paternal grandmother; Diabetes in her paternal aunt; Heart disease in her maternal grandfather and maternal uncle; Leukemia in her father.  Wt Readings from Last 3 Encounters:  08/01/17 138 lb (62.6 kg)  07/14/17 137 lb (62.1 kg)  05/30/17 137 lb 3.2 oz (62.2 kg)    PHYSICAL EXAM BP (!) 166/90   Pulse 70   Ht 5' 4"  (1.626 m)   Wt 138 lb (62.6 kg)   BMI 23.69 kg/m  Physical Exam  Constitutional: She is oriented to person, place, and time. She appears well-developed and well-nourished. No distress.  HENT:  Head: Normocephalic and atraumatic.  Eyes: Pupils are equal, round, and reactive to light.  Neck: Normal range of motion. Neck supple. No JVD present.  Cardiovascular: Normal rate, regular rhythm and intact distal pulses.  Exam reveals no gallop, no friction rub and no midsystolic click.   Murmur heard.  Low-pitched harsh  crescendo-decrescendo early systolic murmur is present with a grade of 1/6  at the upper right sternal border I don't hear a click  Pulmonary/Chest: Effort normal. No respiratory distress. She has no wheezes. She has no rales. She exhibits no tenderness.  Abdominal: Soft. Bowel sounds are normal. She exhibits no distension. There is  no tenderness. There is no rebound.  Musculoskeletal: Normal range of motion. She exhibits no edema or deformity.  Neurological: She is alert and oriented to person, place, and time.  Skin: Skin is warm and dry. No rash noted. No erythema.  Psychiatric: She has a normal mood and affect. Judgment and thought content normal.  Very anxious with almost pressured speech  Nursing note and vitals reviewed.    Adult ECG Report  N/A  Other studies Reviewed: Additional studies/ records that were reviewed today include:  Recent Labs:  No recent labs available.    ASSESSMENT / PLAN: So far, we have evaluated her dyspnea and chest discomfort with an echocardiogram. Now with noticing a more notable chest discomfort on exertion, we will evaluate with Myoview stress test as well. For her poorly controlled hypertension and restart Bystolic 5 mg grams daily. I'm choosing this because she has had intolerance of several medications including other beta blockers in the past.  I'm hoping to get the combined effect of beta blocker and vasodilator without the side effects of fatigue.  With her aneurysmal dilation of the thoracic aorta, she needs more adequate blood pressure control. This is been difficult to do in the past with multiple different medications. Rather than simply increasing her verapamil which I don't think would have much effect, I prefer to have another medication.  Her echo showed a bicuspid aortic valve but no significant stenosis. Consider follow-up in roughly 2 years.  Fluttering in her heart was basically symptomatic PACs and PVCs with short PAT runs. Hopefully  this will be helped with beta blocker.  Problem List Items Addressed This Visit    Aneurysm of thoracic aorta (HCC) (Chronic)    Followed by CT surgery. Blood pressure control is paramount. She does have a bicuspid aortic valve, but no significant stenosis to suggest that this is related to aortic valve jet.   Add low-dose Bystolic for blood pressure control as well as for helping with her palpitations. She has had multiple different medication reactions in the past including of the beta blockers. I'm choosing Bystolic to avoid fatigue side effect and again the additional vasodilator effect.  Mostly because she has been so reticent to try new medications, I want to start medication with the least side effect profile and not try to work myself to additional beta blockers. With thoracic aortic aneurysm, this a potentially life-threatening condition and requires aggressive management.      Relevant Medications   nebivolol (BYSTOLIC) 5 MG tablet   nebivolol (BYSTOLIC) 5 MG tablet   Aortic stenosis (Chronic)    Follow-up echo did show a bicuspid aortic valve but no significant stenosis. We will need follow-up every year or 2.      Relevant Medications   nebivolol (BYSTOLIC) 5 MG tablet   nebivolol (BYSTOLIC) 5 MG tablet   Chest pain with moderate risk for cardiac etiology - Primary (Chronic)    Continued chest pain issues. As it now appears to be not related to the aortic valve, we will evaluate with Myoview stress test.      Relevant Orders   MYOCARDIAL PERFUSION IMAGING   Dyspnea on exertion (Chronic)    Echocardiogram looks pretty good as far as systolic and diastolic function. Will evaluate for ischemia with Myoview.      Relevant Orders   MYOCARDIAL PERFUSION IMAGING   Essential hypertension (Chronic)    Remains poorly controlled with pressure spiking into the 190-200 mmHg range and systolic pressures. With her having  palpitations, I would like to add a beta blocker. She is  already on a calcium channel blocker and ARB. Both are essentially at max dose. Plan: Start Bystolic 5 mg daily with plans to titrate up further. Reduce salt intake.  She has been reluctant to take other medications with multiple different interactions per her report. I am starting Bystolic to avoid Compazine side effects. She has had issues with beta blockers in the past not listed as allergies.      Relevant Medications   nebivolol (BYSTOLIC) 5 MG tablet   nebivolol (BYSTOLIC) 5 MG tablet   Fluttering heart (Chronic)    Very symptomatic to any PAC or PVC. Short PT runs. This is on top of her being on high-dose verapamil. Likely not get much benefit from increasing verapamil. We will start beta blocker: Bystolic 5 mg daily.      Relevant Orders   MYOCARDIAL PERFUSION IMAGING      Current medicines are reviewed at length with the patient today. (+/- concerns) n/a The following changes have been made: see below.  Patient Instructions  MEDICATION START BYSTOLIC 5 MG ONE TABLET DAILY AT NOON.   Schedule at [place] [address] Your physician has requested that you have en exercise stress myoview. For further information please visit [email or web address]. Please follow instruction sheet, as given. TAKE  BYSTOLIC DOSE AFTER YOU DO EXERCISE STRESS MYOVIEW    Your physician recommends that you schedule a follow-up appointment in [place] 2018 [place].    Studies Ordered:   Orders Placed This Encounter  Procedures  . MYOCARDIAL PERFUSION IMAGING      [name], M.D., M.S. Interventional Cardiologist   Pager # [phone] Phone # [phone] [address]. [place], [place] [address]

## 2017-08-01 NOTE — Patient Instructions (Signed)
MEDICATION START BYSTOLIC 5 MG ONE TABLET DAILY AT NOON.   Schedule at [place] [address] Your physician has requested that you have en exercise stress myoview. For further information please visit [email or web address]. Please follow instruction sheet, as given. TAKE  BYSTOLIC DOSE AFTER YOU DO EXERCISE STRESS MYOVIEW    Your physician recommends that you schedule a follow-up appointment in [place] 2018 [place].

## 2017-08-03 ENCOUNTER — Encounter: Payer: Self-pay | Admitting: Cardiology

## 2017-08-05 NOTE — Assessment & Plan Note (Signed)
Echocardiogram looks pretty good as far as systolic and diastolic function. Will evaluate for ischemia with Myoview.

## 2017-08-05 NOTE — Assessment & Plan Note (Signed)
Continued chest pain issues. As it now appears to be not related to the aortic valve, we will evaluate with Myoview stress test.

## 2017-08-05 NOTE — Assessment & Plan Note (Signed)
Follow-up echo did show a bicuspid aortic valve but no significant stenosis. We will need follow-up every year or 2.

## 2017-08-05 NOTE — Assessment & Plan Note (Signed)
Followed by CT surgery. Blood pressure control is paramount. She does have a bicuspid aortic valve, but no significant stenosis to suggest that this is related to aortic valve jet.   Add low-dose Bystolic for blood pressure control as well as for helping with her palpitations. She has had multiple different medication reactions in the past including of the beta blockers. I'm choosing Bystolic to avoid fatigue side effect and again the additional vasodilator effect.  Mostly because she has been so reticent to try new medications, I want to start medication with the least side effect profile and not try to work myself to additional beta blockers. With thoracic aortic aneurysm, this a potentially life-threatening condition and requires aggressive management.

## 2017-08-05 NOTE — Assessment & Plan Note (Signed)
Very symptomatic to any PAC or PVC. Short PT runs. This is on top of her being on high-dose verapamil. Likely not get much benefit from increasing verapamil. We will start beta blocker: Bystolic 5 mg daily.

## 2017-08-05 NOTE — Assessment & Plan Note (Signed)
Remains poorly controlled with pressure spiking into the 190-200 mmHg range and systolic pressures. With her having palpitations, I would like to add a beta blocker. She is already on a calcium channel blocker and ARB. Both are essentially at max dose. Plan: Start Bystolic 5 mg daily with plans to titrate up further. Reduce salt intake.  She has been reluctant to take other medications with multiple different interactions per her report. I am starting Bystolic to avoid Compazine side effects. She has had issues with beta blockers in the past not listed as allergies.

## 2017-08-06 ENCOUNTER — Telehealth (HOSPITAL_COMMUNITY): Payer: Self-pay

## 2017-08-06 NOTE — Telephone Encounter (Signed)
Encounter complete. 

## 2017-08-07 ENCOUNTER — Other Ambulatory Visit (INDEPENDENT_AMBULATORY_CARE_PROVIDER_SITE_OTHER): Payer: Medicare Other

## 2017-08-07 ENCOUNTER — Encounter: Payer: Self-pay | Admitting: Internal Medicine

## 2017-08-07 ENCOUNTER — Ambulatory Visit (INDEPENDENT_AMBULATORY_CARE_PROVIDER_SITE_OTHER): Payer: Medicare Other | Admitting: Internal Medicine

## 2017-08-07 VITALS — BP 138/80 | HR 56 | Temp 98.3°F | Ht 64.0 in | Wt 140.0 lb

## 2017-08-07 DIAGNOSIS — I10 Essential (primary) hypertension: Secondary | ICD-10-CM | POA: Diagnosis not present

## 2017-08-07 DIAGNOSIS — E559 Vitamin D deficiency, unspecified: Secondary | ICD-10-CM | POA: Diagnosis not present

## 2017-08-07 DIAGNOSIS — M797 Fibromyalgia: Secondary | ICD-10-CM | POA: Diagnosis not present

## 2017-08-07 DIAGNOSIS — E039 Hypothyroidism, unspecified: Secondary | ICD-10-CM | POA: Diagnosis not present

## 2017-08-07 DIAGNOSIS — E538 Deficiency of other specified B group vitamins: Secondary | ICD-10-CM

## 2017-08-07 LAB — VITAMIN B12: Vitamin B-12: 270 pg/mL (ref 211–911)

## 2017-08-07 LAB — T4, FREE: Free T4: 1.22 ng/dL (ref 0.60–1.60)

## 2017-08-07 LAB — VITAMIN D 25 HYDROXY (VIT D DEFICIENCY, FRACTURES): VITD: 16.58 ng/mL — AB (ref 30.00–100.00)

## 2017-08-07 LAB — TSH: TSH: 1.12 u[IU]/mL (ref 0.35–4.50)

## 2017-08-07 NOTE — Patient Instructions (Signed)
We are checking the labs today.

## 2017-08-07 NOTE — Progress Notes (Signed)
   Subjective:    Patient ID: Marissa [name], female    DOB: 10-10-1946, 71 y.o.   MRN: [id number]  HPI The patient is a 71 YO female coming in for follow up of her medical conditions including her thyroid (taking synthroid 125 mcg daily, not missing doses, feels some hair thinning and cold intolerance since last visit), and her fibromyalgia (takes soma and rare demerol, she is stable, has bad days sometimes, states no days are good, manages to achieve household tasks most of the time that she wants to), and her blood pressure (BP above goal at recent cardiology visit and they added nebivolol to her irbesartan and verapamil, no side effects, BP at goal today, denies headaches, chest pains, SOB). No new concerns.   Review of Systems  Constitutional: Positive for fatigue. Negative for activity change, appetite change, chills, fever and unexpected weight change.  HENT: Negative.   Eyes: Negative.   Respiratory: Negative for cough, chest tightness and shortness of breath.   Cardiovascular: Negative for chest pain, palpitations and leg swelling.  Gastrointestinal: Negative for abdominal distention, abdominal pain, constipation, diarrhea, nausea and vomiting.  Musculoskeletal: Positive for arthralgias and myalgias.  Skin: Negative.   Neurological: Negative.   Psychiatric/Behavioral: Negative.       Objective:   Physical Exam  Constitutional: She is oriented to person, place, and time. She appears well-developed and well-nourished.  HENT:  Head: Normocephalic and atraumatic.  Eyes: EOM are normal.  Neck: Normal range of motion.  Cardiovascular: Normal rate and regular rhythm.   Pulmonary/Chest: Effort normal and breath sounds normal. No respiratory distress. She has no wheezes. She has no rales.  Abdominal: Soft. Bowel sounds are normal. She exhibits no distension. There is no tenderness. There is no rebound.  Musculoskeletal: She exhibits no edema.  Neurological: She is alert and oriented to  person, place, and time. Coordination normal.  Skin: Skin is warm and dry.  Psychiatric: She has a normal mood and affect.   Vitals:   08/07/17 1336  BP: 138/80  Pulse: (!) 56  Temp: 98.3 F (36.8 C)  TempSrc: Oral  SpO2: 99%  Weight: 140 lb (63.5 kg)  Height: 5' 4"  (1.626 m)      Assessment & Plan:

## 2017-08-08 ENCOUNTER — Encounter: Payer: Self-pay | Admitting: Internal Medicine

## 2017-08-08 NOTE — Assessment & Plan Note (Signed)
Using soma and demerol for the pain. This does allow her to be functional. She denies taking more than prescribed.

## 2017-08-08 NOTE — Assessment & Plan Note (Signed)
Better control today with verapamil, bystolic, irbesartan. Denies side effects. HR okay. Recent BMP okay.

## 2017-08-08 NOTE — Assessment & Plan Note (Signed)
Checking B12 level. She is taking oral B12.

## 2017-08-08 NOTE — Assessment & Plan Note (Signed)
Checking TSH and free T4 and adjust her synthroid 125 mcg daily if needed.

## 2017-08-10 ENCOUNTER — Other Ambulatory Visit: Payer: Self-pay | Admitting: Family

## 2017-08-10 ENCOUNTER — Other Ambulatory Visit: Payer: Self-pay | Admitting: Internal Medicine

## 2017-08-11 ENCOUNTER — Other Ambulatory Visit: Payer: Self-pay | Admitting: Internal Medicine

## 2017-08-11 ENCOUNTER — Telehealth: Payer: Self-pay | Admitting: Cardiology

## 2017-08-11 MED ORDER — VITAMIN D (ERGOCALCIFEROL) 1.25 MG (50000 UNIT) PO CAPS: 50000.0000 [IU] | ORAL_CAPSULE | ORAL | 0 refills | Status: DC

## 2017-08-11 MED ORDER — MEPERIDINE HCL 50 MG PO TABS: 50.0000 mg | ORAL_TABLET | Freq: Four times a day (QID) | ORAL | 0 refills | Status: DC | PRN

## 2017-08-11 MED ORDER — NEBIVOLOL HCL 5 MG PO TABS: 5.0000 mg | ORAL_TABLET | Freq: Every day | ORAL | 0 refills | Status: DC

## 2017-08-11 NOTE — Telephone Encounter (Signed)
New message    Patient calling the office for samples of medication:   1.  What medication and dosage are you requesting samples for?nebivolol (BYSTOLIC) 5 MG tablet  2.  Are you currently out of this medication? yes

## 2017-08-11 NOTE — Telephone Encounter (Signed)
LVM informing patient that RX is up front ready for pick up

## 2017-08-11 NOTE — Telephone Encounter (Signed)
Samples available for pick up x 4 . Patient aware

## 2017-08-12 ENCOUNTER — Ambulatory Visit (HOSPITAL_COMMUNITY)
Admission: RE | Admit: 2017-08-12 | Discharge: 2017-08-12 | Disposition: A | Discharge status: Home / Self Care | Payer: Medicare Other | Source: Ambulatory Visit | Attending: Cardiology | Admitting: Cardiology

## 2017-08-12 DIAGNOSIS — E785 Hyperlipidemia, unspecified: Secondary | ICD-10-CM | POA: Insufficient documentation

## 2017-08-12 DIAGNOSIS — I712 Thoracic aortic aneurysm, without rupture: Secondary | ICD-10-CM | POA: Insufficient documentation

## 2017-08-12 DIAGNOSIS — Z8249 Family history of ischemic heart disease and other diseases of the circulatory system: Secondary | ICD-10-CM | POA: Insufficient documentation

## 2017-08-12 DIAGNOSIS — R011 Cardiac murmur, unspecified: Secondary | ICD-10-CM | POA: Diagnosis not present

## 2017-08-12 DIAGNOSIS — R079 Chest pain, unspecified: Secondary | ICD-10-CM | POA: Insufficient documentation

## 2017-08-12 DIAGNOSIS — J45909 Unspecified asthma, uncomplicated: Secondary | ICD-10-CM | POA: Diagnosis not present

## 2017-08-12 DIAGNOSIS — R0609 Other forms of dyspnea: Secondary | ICD-10-CM | POA: Diagnosis not present

## 2017-08-12 DIAGNOSIS — R0600 Dyspnea, unspecified: Secondary | ICD-10-CM

## 2017-08-12 DIAGNOSIS — E079 Disorder of thyroid, unspecified: Secondary | ICD-10-CM | POA: Diagnosis not present

## 2017-08-12 DIAGNOSIS — I10 Essential (primary) hypertension: Secondary | ICD-10-CM | POA: Insufficient documentation

## 2017-08-12 DIAGNOSIS — Q231 Congenital insufficiency of aortic valve: Secondary | ICD-10-CM | POA: Insufficient documentation

## 2017-08-12 DIAGNOSIS — I498 Other specified cardiac arrhythmias: Secondary | ICD-10-CM | POA: Diagnosis not present

## 2017-08-12 LAB — MYOCARDIAL PERFUSION IMAGING
CHL CUP NUCLEAR SRS: 0
CSEPPHR: 80 {beats}/min
LV sys vol: 19 mL
LVDIAVOL: 60 mL (ref 46–106)
Rest HR: 60 {beats}/min
SDS: 3
SSS: 3
TID: 1.04

## 2017-08-12 MED ORDER — REGADENOSON 0.4 MG/5ML IV SOLN
0.4000 mg | Freq: Once | INTRAVENOUS | Status: AC
  Administered 2017-08-12: 0.4 mg via INTRAVENOUS

## 2017-08-12 MED ORDER — AMINOPHYLLINE 25 MG/ML IV SOLN
75.0000 mg | Freq: Once | INTRAVENOUS | Status: AC
  Administered 2017-08-12: 75 mg via INTRAVENOUS

## 2017-08-12 MED ORDER — TECHNETIUM TC 99M TETROFOSMIN IV KIT
31.4000 | PACK | Freq: Once | INTRAVENOUS | Status: AC | PRN
  Administered 2017-08-12: 31.4 via INTRAVENOUS
  Filled 2017-08-12: qty 32

## 2017-08-12 MED ORDER — TECHNETIUM TC 99M TETROFOSMIN IV KIT
10.5000 | PACK | Freq: Once | INTRAVENOUS | Status: AC | PRN
  Administered 2017-08-12: 10.5 via INTRAVENOUS
  Filled 2017-08-12: qty 11

## 2017-08-13 ENCOUNTER — Telehealth: Payer: Self-pay | Admitting: Cardiology

## 2017-08-13 ENCOUNTER — Telehealth: Payer: Self-pay | Admitting: Emergency Medicine

## 2017-08-13 NOTE — Telephone Encounter (Signed)
Please call,concerning her Betablocker(could not remember the name of it).

## 2017-08-13 NOTE — Telephone Encounter (Signed)
Pt called and asked that your give her a call back. She has some questions about her medications. Thanks.

## 2017-08-13 NOTE — Telephone Encounter (Signed)
Spoke to patient. She reviewed past information. She states she has used  Generic and brand of corgard, inderal , toprol ,atenolol-- she states all medications  Caused weakness extreme tiredness , chronic fatigue was worse. She used medication over period of 3-4 years.  RN  WILL PLACE INFORMATION ON FORMULARY EXCEPTION FOR BYSTOLIC

## 2017-08-14 NOTE — Telephone Encounter (Signed)
Patient is wanting to know if she is able to get her thyroid medication bumped up if that would seem appropriate. I told her that her thyroid levels are in a good range. Patient states that if you are not willing to bump it up she will be okay with it. She seemed to want her levels to be a little bit higher?

## 2017-08-14 NOTE — Telephone Encounter (Signed)
Her levels are already on the high end of normal and not safe to increase the dosing.

## 2017-08-23 ENCOUNTER — Other Ambulatory Visit: Payer: Self-pay | Admitting: Internal Medicine

## 2017-08-26 ENCOUNTER — Other Ambulatory Visit: Payer: Self-pay | Admitting: Cardiology

## 2017-08-26 ENCOUNTER — Other Ambulatory Visit: Payer: Self-pay | Admitting: Internal Medicine

## 2017-08-27 NOTE — Telephone Encounter (Signed)
REFILL 

## 2017-09-02 ENCOUNTER — Telehealth: Payer: Self-pay | Admitting: Cardiology

## 2017-09-02 NOTE — Telephone Encounter (Signed)
Pt's's Bystolic have been approved for 1 year,It will be good until 09-01-18

## 2017-09-02 NOTE — Telephone Encounter (Signed)
Patient has been called and made aware of the Bystolic approval. She verbalized her understanding.

## 2017-09-03 ENCOUNTER — Telehealth: Payer: Self-pay | Admitting: Cardiology

## 2017-09-03 MED ORDER — NEBIVOLOL HCL 5 MG PO TABS: 5.0000 mg | ORAL_TABLET | Freq: Every day | ORAL | 2 refills | Status: DC

## 2017-09-03 NOTE — Telephone Encounter (Signed)
Patient made aware that the refill has been sent in. She verbalized her understanding.

## 2017-09-03 NOTE — Telephone Encounter (Signed)
Patient calling, states that she was recently approved for Bystolic and needs a prescription for Bystolic 5 mg 1x daily (30 day) sent to [facility].   Patient is in doughnut hole now.

## 2017-09-08 DIAGNOSIS — M5136 Other intervertebral disc degeneration, lumbar region: Secondary | ICD-10-CM | POA: Diagnosis not present

## 2017-09-08 DIAGNOSIS — M7062 Trochanteric bursitis, left hip: Secondary | ICD-10-CM | POA: Diagnosis not present

## 2017-09-08 IMAGING — MR MR LUMBAR SPINE W/O CM
4 of 5 series · 26 of 48 positions shown · non-contrast
Comparison: 06/02/2015

CLINICAL DATA: Low back pain. Left lower extremity and left buttock
pain. Numbness and weakness in the left leg.

EXAM:
MRI LUMBAR SPINE WITHOUT CONTRAST
TECHNIQUE: Multiplanar, multisequence MR imaging of the lumbar spine was
performed. No intravenous contrast was administered.

[Series 3: T2 · sagittal · 4.0mm · 0.49mm/px · 6 of 12 slices shown (1 of 2)]
[im 1/12]
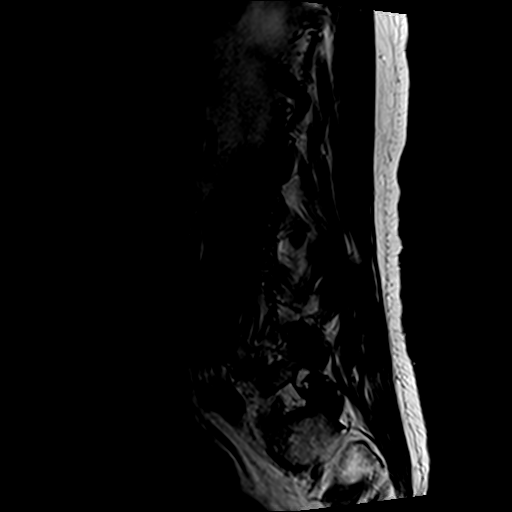
[im 3/12]
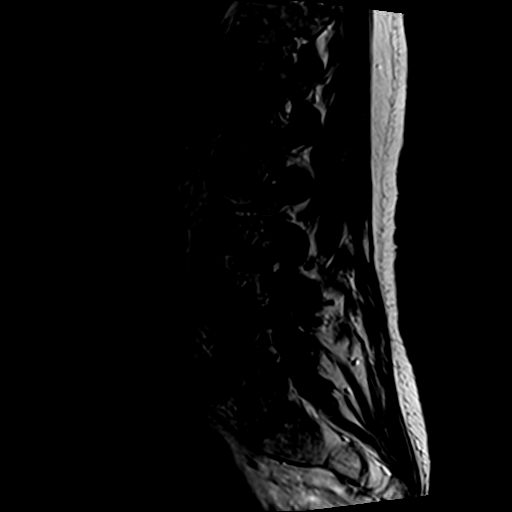
[im 5/12]
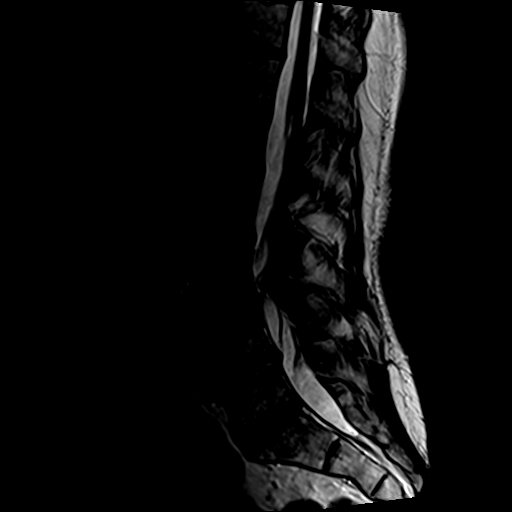
[im 7/12]
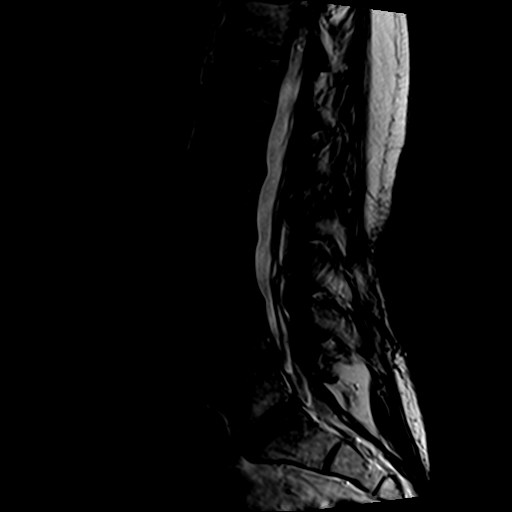
[im 9/12]
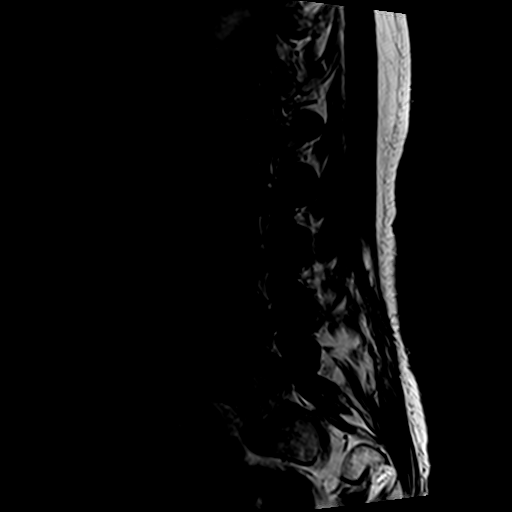
[im 12/12]
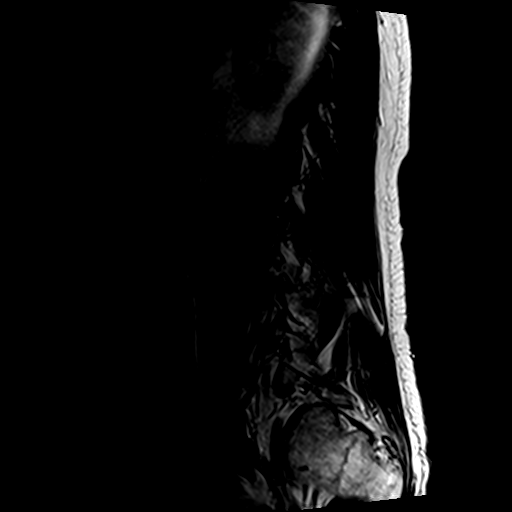

[Series 4: T1 · sagittal · 4.0mm · 0.49mm/px · 6 of 12 slices shown (1 of 2)]
[im 1/12]
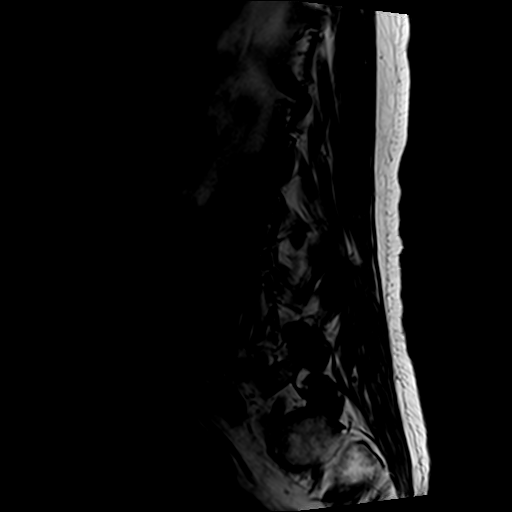
[im 3/12]
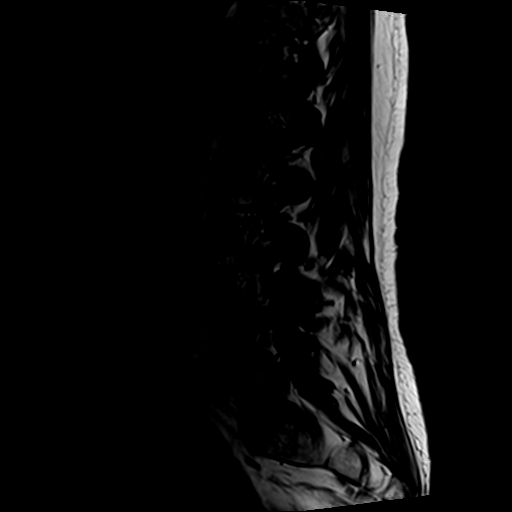
[im 5/12]
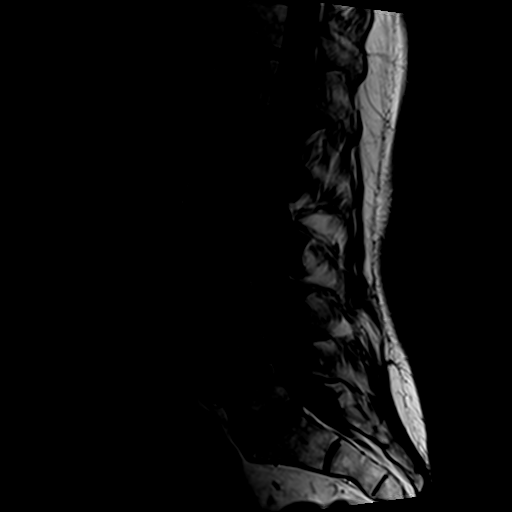
[im 7/12]
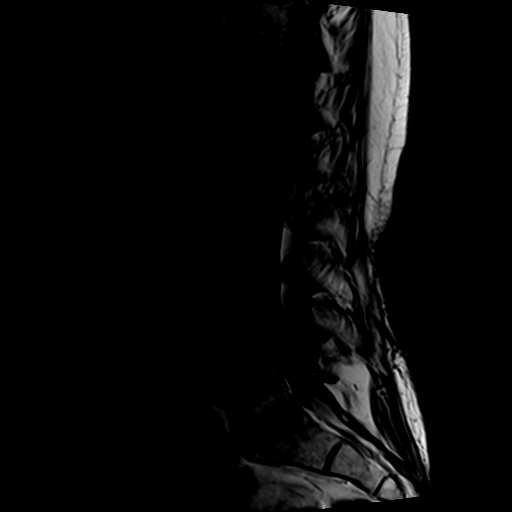
[im 9/12]
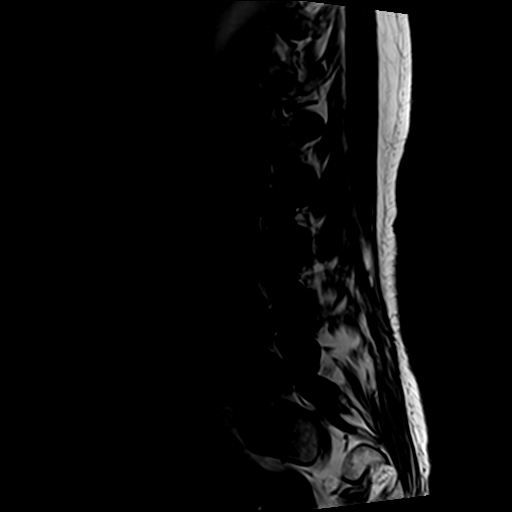
[im 12/12]
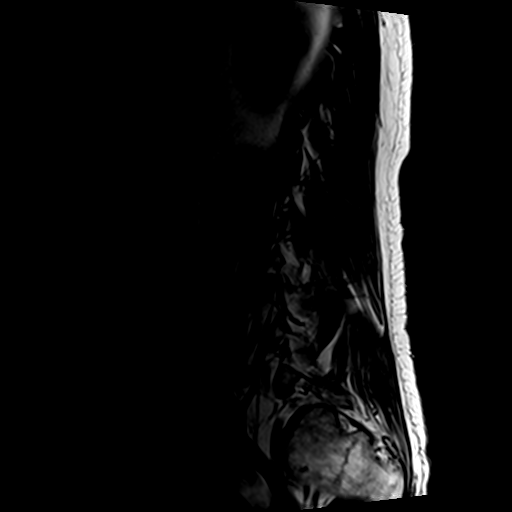

[Series 6: T2 · axial · 4.0mm · 0.74mm/px · z∈[-118,+49]mm · 9 of 31 slices shown (2 of 2)]
[im 1/31]
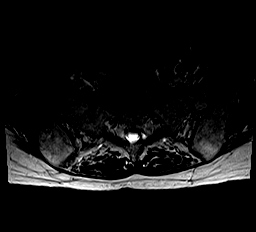
[im 5/31]
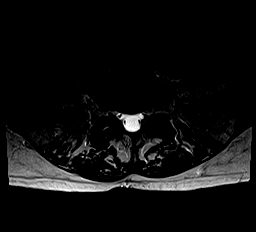
[im 9/31]
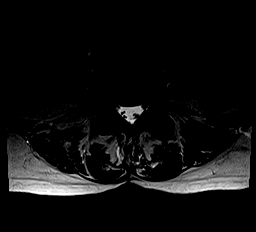
[im 13/31]
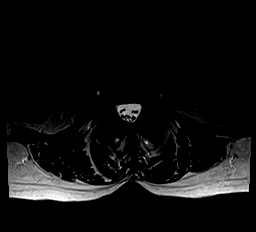
[im 16/31]
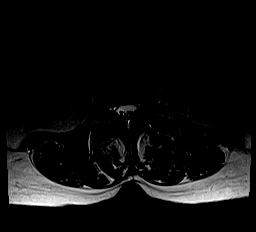
[im 18/31]
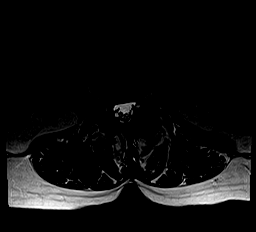
[im 22/31]
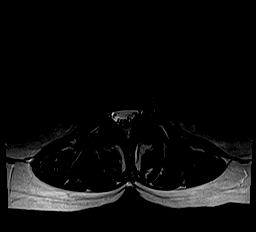
[im 26/31]
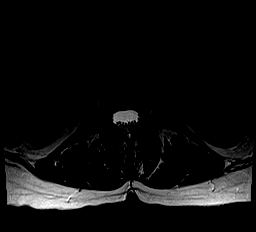
[im 31/31]
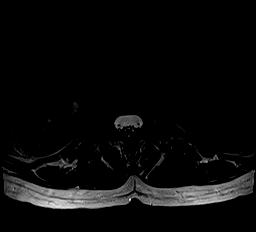

[Series 7: T1 · axial · 4.0mm · 0.37mm/px · z∈[-118,+25]mm · 5 of 31 slices shown (2 of 2)]
[im 1/31]
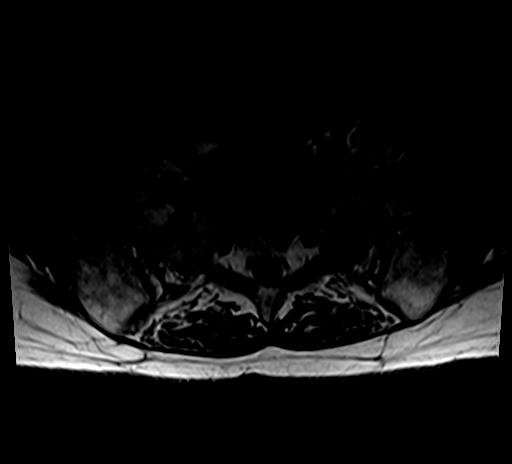
[im 5/31]
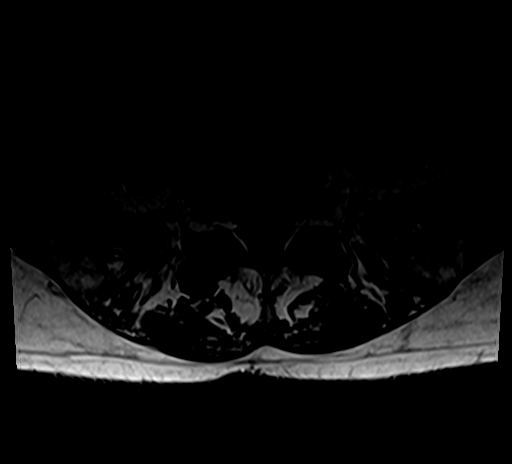
[im 9/31]
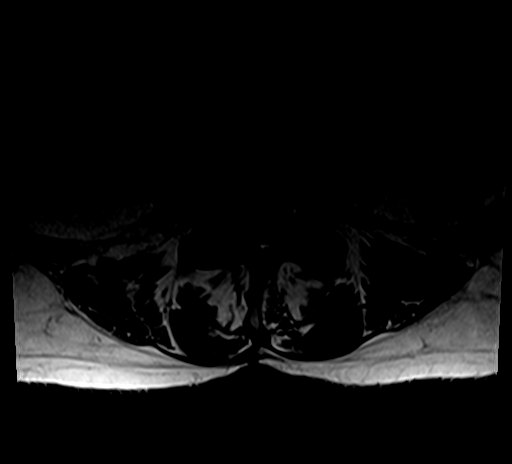
[im 16/31]
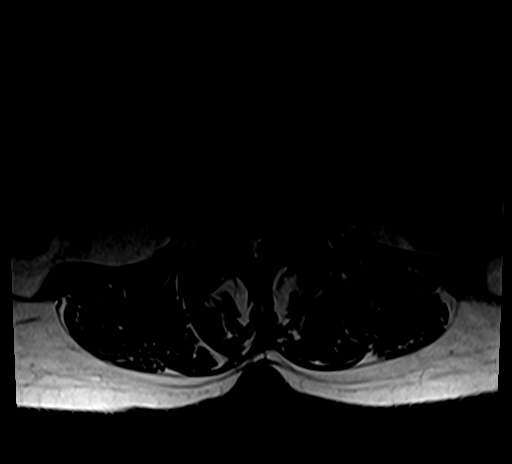
[im 26/31]
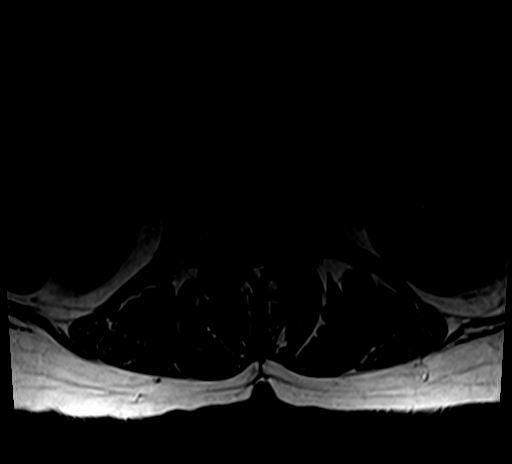

[26 of 48 positions shown; findings below may reference images not displayed]

FINDINGS: The lowest lumbar type non-rib-bearing vertebra is labeled as L5.
The conus medullaris appears normal. Conus level: L1.

There is 3 mm grade 1 degenerative anterolisthesis at L3-4. Disc
desiccation notable at L1- 2, L2- 3, and L3-4 with loss of disc
height at L4-5.

Mild dextroconvex lumbar scoliosis with rotary component.

Additional findings at individual levels are as follows:

L1-2:  No impingement.  Mild disc bulge.

L2-3: Mild displacement of the right L2 nerve in the lateral
extraforaminal space due to right lateral extraforaminal disc
protrusion. Minimal left facet arthropathy.

L3-4: Mild left and borderline right foraminal stenosis with mild
central narrowing of the thecal sac and borderline left subarticular
lateral recess stenosis due to disc bulge, disc uncovering, facet
arthropathy, and bilateral synovial cysts adjacent to the lateral
recesses.

L4-5: Mild right and borderline left subarticular lateral recess
stenosis with borderline central narrowing of the thecal sac and
borderline right foraminal stenosis due to disc bulge, central disc
protrusion, and facet arthropathy.

L5-S1:  No impingement.  Bilateral facet arthropathy.
IMPRESSION: 1. Lumbar spondylosis and degenerative disc disease, causing mild
impingement at L2- 3, L3-4, and L4-5, as detailed above.
2. Mild dextroconvex lumbar scoliosis with rotary component.

## 2017-09-13 DIAGNOSIS — M5136 Other intervertebral disc degeneration, lumbar region: Secondary | ICD-10-CM | POA: Diagnosis not present

## 2017-09-25 DIAGNOSIS — M5136 Other intervertebral disc degeneration, lumbar region: Secondary | ICD-10-CM | POA: Diagnosis not present

## 2017-09-25 DIAGNOSIS — M7062 Trochanteric bursitis, left hip: Secondary | ICD-10-CM | POA: Diagnosis not present

## 2017-09-25 DIAGNOSIS — M25552 Pain in left hip: Secondary | ICD-10-CM | POA: Diagnosis not present

## 2017-09-25 DIAGNOSIS — M1612 Unilateral primary osteoarthritis, left hip: Secondary | ICD-10-CM | POA: Diagnosis not present

## 2017-09-26 ENCOUNTER — Other Ambulatory Visit: Payer: Self-pay | Admitting: Internal Medicine

## 2017-09-27 ENCOUNTER — Other Ambulatory Visit: Payer: Self-pay | Admitting: Internal Medicine

## 2017-10-01 DIAGNOSIS — M1612 Unilateral primary osteoarthritis, left hip: Secondary | ICD-10-CM | POA: Diagnosis not present

## 2017-10-07 ENCOUNTER — Ambulatory Visit: Payer: Medicare Other | Admitting: Cardiology

## 2017-10-10 ENCOUNTER — Telehealth: Payer: Self-pay | Admitting: Internal Medicine

## 2017-10-10 NOTE — Telephone Encounter (Signed)
Rec'd from [place] forwarded 4 pages to Dr. Pricilla Holm

## 2017-10-17 ENCOUNTER — Other Ambulatory Visit: Payer: Self-pay | Admitting: Internal Medicine

## 2017-10-18 ENCOUNTER — Encounter: Payer: Self-pay | Admitting: Internal Medicine

## 2017-10-20 ENCOUNTER — Other Ambulatory Visit: Payer: Self-pay

## 2017-10-20 MED ORDER — FLUOXETINE HCL 40 MG PO CAPS: 40.0000 mg | ORAL_CAPSULE | Freq: Two times a day (BID) | ORAL | 5 refills | Status: DC

## 2017-10-30 ENCOUNTER — Other Ambulatory Visit: Payer: Self-pay | Admitting: Internal Medicine

## 2017-11-03 ENCOUNTER — Encounter: Payer: Self-pay | Admitting: Cardiology

## 2017-11-03 ENCOUNTER — Ambulatory Visit: Payer: Medicare Other | Admitting: Cardiology

## 2017-11-03 VITALS — BP 196/98 | HR 68 | Wt 138.6 lb

## 2017-11-03 DIAGNOSIS — R079 Chest pain, unspecified: Secondary | ICD-10-CM

## 2017-11-03 DIAGNOSIS — R0609 Other forms of dyspnea: Secondary | ICD-10-CM

## 2017-11-03 DIAGNOSIS — I10 Essential (primary) hypertension: Secondary | ICD-10-CM

## 2017-11-03 DIAGNOSIS — I350 Nonrheumatic aortic (valve) stenosis: Secondary | ICD-10-CM | POA: Diagnosis not present

## 2017-11-03 DIAGNOSIS — I712 Thoracic aortic aneurysm, without rupture, unspecified: Secondary | ICD-10-CM

## 2017-11-03 DIAGNOSIS — R0600 Dyspnea, unspecified: Secondary | ICD-10-CM

## 2017-11-03 MED ORDER — NEBIVOLOL HCL 10 MG PO TABS: 10.0000 mg | ORAL_TABLET | Freq: Every day | ORAL | 6 refills | Status: DC

## 2017-11-03 MED ORDER — HYDRALAZINE HCL 25 MG PO TABS: 25.0000 mg | ORAL_TABLET | Freq: Three times a day (TID) | ORAL | 6 refills | Status: DC

## 2017-11-03 NOTE — Patient Instructions (Signed)
Medications instructions  INCREASE BYSTOLIC TO 10 MG - ONE TABLET DAILY  HYDRALAZINE 25 MG ONE TABLET TWICE DAILY- MAY TAKE AN EXTRA HYDRALAZINE  25 MG (3RD  DOSE THAT DAY) IF BLOOD PRESSURE SYSTOLIC NUMBER (TOP NUMBER) 160 TO 190   IF BLOOD PRESSURE IS GREATER THAN  190 -TAKE AN ADDITIONAL 50 MG OF HYDRALAZINE ( 3 RD DOSE THAT DAY)     Your physician recommends that you schedule a follow-up appointment in 3 WEEKS WITH [facility]- BLOOD PRESSURE.     Your physician wants you to follow-up in: 2 TO 3 MONTHS WITH DR HARDING.

## 2017-11-03 NOTE — Progress Notes (Signed)
PCP: Marissa Koch, MD  Clinic Note: Chief Complaint  Patient presents with  . Follow-up    pt c/o dizziness and weakness in legs  . Chest Pain    Follow-up Myoview  . Hypertension    Labile, difficult to control    HPI:  Marissa [name] is a 72 y.o. female with a history bicuspid aortic valve and hypertension as well as thoracic aortic aneurysm who is seen for delayed follow-up after initial evaluation for chest discomfort and dyspnea associated with labile/difficult to manage HTN. She also noted palpitations. -- Her original primary doctor had difficulty managing her hypertension having to use valsartan or verapamil because of intolerance of medications. Along with hypertension, she has hyperlipidemia, ulcerative colitis, scoliosis, spinal stenosis as well as asthma and arthritis. There is also suggestive history of mitral prolapse.  She had been followed by Dr. Roxan [name] for an ascending aortic aneurysm and heart murmur with loud click. The aneurysm has been followed since 2012, and has remained stable at the 4.2-4.3 cm level.  Marissa [name] was last seen in October she noted persistently elevated blood pressures in the 190 mmHg range, but as low as 110.  This was also associated with occasional palpitations.  In addition to that she noted some deep aching sensations in her chest off and on throughout the day worse with exertion.  As a result, I decided to order a Myoview stress test.  She now returns to discuss the results.  Recent Hospitalizations: None  Studies Personally Reviewed - (if available, images/films reviewed: From Epic Chart or Care Everywhere)  Myoview August 12, 2017: EF 69%.  Hyperdynamic left ventricle.  No ischemia or infarction.  LOW RISK.  Interval History: Marissa [name] returns for further evaluation -delayed follow-up to review the results of her Myoview stress test.  Her last appointment was rescheduled because of snow. BP range mostly 140-180s.    Sometimes feels flutters & occasional dizziness as well as weakness..  Occasionally has chest pain to back & shoulders.  She just overall does not feel well.  She states that she feels flushed and dizzy.  She feels tired with aches and pains all over.  She was happy to hear the results of her stress test, but is not sure than why her symptoms continue.  She has not noted as much of the palpitations however.  What she does does feel episodes of her heart pounding hard when she feels a discomfort in her chest. She denies any significant exertional dyspnea, but this is not been doing much.  She has dizziness and lightheadedness but no syncope or near syncope.  No TIA or amaurosis fugax. No heart failure symptoms of PND, orthopnea or edema.  She does however note some occasional dyspnea doing things such as more vigorous chores or going up steps.  She denies any claudication.   ROS: A comprehensive was performed. Review of Systems  Constitutional: Positive for malaise/fatigue. Negative for chills and fever.  HENT: Negative for congestion and nosebleeds.   Respiratory: Positive for shortness of breath. Negative for cough and wheezing.   Cardiovascular: Positive for chest pain (Per history of present illness).  Gastrointestinal: Negative for blood in stool and melena.  Genitourinary: Negative for dysuria and hematuria.  Musculoskeletal: Positive for joint pain and myalgias.       Chronic fatigue and fibromyalgia pains  Skin: Negative.   Neurological: Positive for dizziness and headaches. Negative for focal weakness and loss of consciousness.  Psychiatric/Behavioral: The patient is nervous/anxious.   All other systems reviewed and are negative.   I have reviewed and (if needed) personally updated the patient's problem list, medications, allergies, past medical and surgical history, social and family history.   Past Medical History:  Diagnosis Date  . Arthritis   . Asthma   . Bicuspid aortic  valve   . Bursitis   . Cataract    mild  . Dysthymia   . Heart valve regurgitation   . Hemorrhoids   . Hx of ulcerative colitis    per dr Arnoldo Morale as per pt.  . Hyperlipidemia   . Hypertension    managed - labile.  . Migraine   . Scoliosis   . 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 Spinal stenosis   . Thoracic aortic aneurysm (HCC)    Stable 4.2-4.3 cm (followed by Dr. Roxan [name])  . Thyroid disease     Past Surgical History:  Procedure Laterality Date  . ABDOMINAL HYSTERECTOMY    . BUNIONECTOMY    . Cardiac Event Monitor  07/2017   Overall relatively normal.  Normal sinus rhythm with rare bradycardia and tachycardia.  Heart rate ranged from 55-110 bpm.  Occasional PACs and PVCs, every single 1 was felt.  No arrhythmias other than one short 4 beat run of PACs.  . COLONOSCOPY  02-04-2005   all normal   . FOOT SURGERY     3 pins in toes   . HAND SURGERY     left thumb joint resection  . nasal revision    . SINUS IRRIGATION    . TONSILLECTOMY AND ADENOIDECTOMY    . TRANSTHORACIC ECHOCARDIOGRAM  06/'1, 8/'19   a) Moderate LVH. Normal EF 60-65%. Normal diastolic parameters. --> Difficult to fully visualize the aortic valve. Cannot exclude bicuspid valve. Mild aortic stenosis noted. No PFO. Mildly dilated left atrium. Trivial MR. No comment on mitral valve prolapse. Moderately dilated ascending aorta.;; b) F/u Echo To evaluate the aortic valve. -- Bicuspid AoV - mildly thickened / calcified. - No stenosis    Current Meds  Medication Sig  . albuterol (PROAIR HFA) 108 (90 Base) MCG/ACT inhaler INHALE 2 PUFFS INTO THE LUNGS EVERY 6 (SIX) HOURS AS NEEDED.  . carisoprodol (SOMA) 350 MG tablet TAKE 1 TABLET BY MOUTH THREE TIMES DAILY AS NEEDED  . cycloSPORINE (RESTASIS) 0.05 % ophthalmic emulsion Place 1 drop into both eyes 2 (two) times daily.  . Eszopiclone 3 MG  TABS Take 1 tablet (3 mg total) by mouth at bedtime. Take immediately before bedtime  . famotidine (PEPCID) 20 MG tablet Take 20 mg by mouth at bedtime.  [name] Kitchen FLUoxetine (PROZAC) 40 MG capsule Take 1 capsule (40 mg total) by mouth 2 (two) times daily.  . irbesartan (AVAPRO) 300 MG tablet TAKE 1 TABLET(300 MG) BY MOUTH DAILY  . levothyroxine (SYNTHROID, LEVOTHROID) 125 MCG tablet TAKE 1 TABLET(125 MCG) BY MOUTH DAILY  . meperidine (DEMEROL) 50 MG tablet Take 1 tablet (50 mg total) by mouth every 6 (six) hours as needed for severe pain. One every 4 hours if coughing  . MINIVELLE 0.05 MG/24HR patch Place 1 patch onto the skin 2 (two) times a week.   . mirabegron ER (MYRBETRIQ) 50 MG TB24 tablet Take 1 tablet (50 mg total) by mouth daily. Annual appt due in September must see MD for  refills  . montelukast (SINGULAIR) 10 MG tablet TAKE 1 TABLET(10 MG) BY MOUTH AT BEDTIME  . omeprazole (PRILOSEC) 20 MG capsule Take 20 mg by mouth daily before breakfast.  . Respiratory Therapy Supplies (FLUTTER) DEVI Use as directed  . verapamil (CALAN-SR) 240 MG CR tablet TAKE 1 TABLET(240 MG) BY MOUTH AT BEDTIME  . Vitamin D, Ergocalciferol, (DRISDOL) 50000 units CAPS capsule TAKE 1 CAPSULE BY MOUTH EVERY 7 DAYS  . [DISCONTINUED] MYRBETRIQ 50 MG TB24 tablet TAKE 1 TABLET(50 MG) BY MOUTH DAILY  . [DISCONTINUED] nebivolol (BYSTOLIC) 5 MG tablet Take 1 tablet (5 mg total) daily by mouth.    Allergies  Allergen Reactions  . Fentanyl Shortness Of Breath and Rash  . Amoxicillin Diarrhea    Severe diarrhea, rash to vaginal area with swelling   . Hctz [Hydrochlorothiazide] Other (See Comments)    HypoNatremia  . Codeine Other (See Comments)    makes her hyper  . Conjugated Estrogens Itching and Rash  . Erythromycin Rash    had a rash with emycin, has done ok with other meds in it's class  . Piroxicam Itching and Rash    Feldene   Social History   Tobacco Use  . Smoking status: Never Smoker  . Smokeless tobacco:  Never Used  Substance Use Topics  . Alcohol use: Yes    Alcohol/week: 0.0 oz    Comment: occ   . Drug use: No    family history includes Arthritis in her mother; Breast cancer in her maternal grandmother, paternal aunt, and paternal grandmother; Diabetes in her paternal aunt; Heart disease in her maternal grandfather and maternal uncle; Leukemia in her father.  Wt Readings from Last 3 Encounters:  11/03/17 138 lb 9.6 oz (62.9 kg)  08/12/17 138 lb (62.6 kg)  08/07/17 140 lb (63.5 kg)    PHYSICAL EXAM BP (!) 196/98 (BP Location: Right Arm, Patient Position: Sitting, Cuff Size: Normal)   Pulse 68   Wt 138 lb 9.6 oz (62.9 kg)   BMI 23.79 kg/m  Physical Exam  Constitutional: She is oriented to person, place, and time. She appears well-developed and well-nourished. No distress.  HENT:  Head: Normocephalic and atraumatic.  Neck: Normal range of motion. Neck supple. No JVD present.  Cardiovascular: Normal rate, regular rhythm and intact distal pulses.  No extrasystoles are present. PMI is not displaced. Exam reveals no gallop, no friction rub and no midsystolic click.  Murmur heard.  Low-pitched harsh crescendo-decrescendo early systolic murmur is present with a grade of 1/6 at the upper right sternal border. No obvious click  Pulmonary/Chest: Effort normal. No respiratory distress. She has no wheezes. She has no rales. She exhibits no tenderness.  Abdominal: Soft. Bowel sounds are normal. She exhibits no distension. There is no tenderness.  Musculoskeletal: Normal range of motion. She exhibits no edema.  Neurological: She is alert and oriented to person, place, and time.  Skin: Skin is warm and dry. No rash noted. No erythema.  Psychiatric: She has a normal mood and affect. Judgment and thought content normal.  Remains anxious.  Less pressured speech  Nursing note and vitals reviewed.    Adult ECG Report  N/A  Other studies Reviewed: Additional studies/ records that were  reviewed today include:  Recent Labs:  No recent labs available.    ASSESSMENT / PLAN: She has now had a full evaluation for her dyspnea and chest discomfort with a Myoview stress test be performed as well, and read as negative. The main  focus now is reported controlled hypertension.  She wonders if it may be related to her medications.  I think it simply because her blood pressure has just not been adequately controlled.  With aneurysmal dilation of the thoracic aorta, blood pressure control is the mainstay of therapy.  With the addition of Bystolic, her palpitations seem to have improved however.  Plan for now is to increase Bystolic, and add hydralazine as noted below.  She will follow-up in our pharmacist run [place] ([facility]), for additional titration medication.  Problem List Items Addressed This Visit    Aneurysm of thoracic aorta (HCC) (Chronic)    Followed by CT surgery. Continue blood pressure control.      Relevant Medications   hydrALAZINE (APRESOLINE) 25 MG tablet   nebivolol (BYSTOLIC) 10 MG tablet   Aortic stenosis (Chronic)   Relevant Medications   hydrALAZINE (APRESOLINE) 25 MG tablet   nebivolol (BYSTOLIC) 10 MG tablet   Chest pain with moderate risk for cardiac etiology (Chronic)    Negative Myoview.  This may all be related to dysthymia. Continue to treat blood pressure. At this time will probably not pursue any further ischemic evaluation.      Dyspnea on exertion (Chronic)    Probably related to hypertension with most likely some diastolic dysfunction.  Otherwise relatively normal echocardiogram.  Although there is evidence of bicuspid aortic valve, there is no significant stenosis, and her Myoview is negative for ischemia.      Essential hypertension - Primary (Chronic)    Labile, difficult to control.  She remains on verapamil, Bystolic and irbesartan. Plan:   Increase Bystolic to 10 mg daily.    Add hydralazine 25 mg twice  daily.  Plan will be for her to take twice daily hydralazine, and take the third dose as needed for SBP greater than 160.  If pressure is greater than 190, she will take 2 additional hydralazine doses.  I would like to consider converting from verapamil to amlodipine or felodipine to avoid bradycardia and potential Fatigue is a combination with Bystolic.  However for now I think we will use hydralazine      Relevant Medications   hydrALAZINE (APRESOLINE) 25 MG tablet   nebivolol (BYSTOLIC) 10 MG tablet      Current medicines are reviewed at length with the patient today. (+/- concerns) n/a The following changes have been made: see below.  Patient Instructions  Medications instructions  INCREASE BYSTOLIC TO 10 MG - ONE TABLET DAILY  HYDRALAZINE 25 MG ONE TABLET TWICE DAILY- MAY TAKE AN EXTRA HYDRALAZINE  25 MG (3RD  DOSE THAT DAY) IF BLOOD PRESSURE SYSTOLIC NUMBER (TOP NUMBER) 160 TO 190   IF BLOOD PRESSURE IS GREATER THAN  190 -TAKE AN ADDITIONAL 50 MG OF HYDRALAZINE ( 3 RD DOSE THAT DAY)     Your physician recommends that you schedule a follow-up appointment in 3 WEEKS WITH [facility]- BLOOD PRESSURE.     Your physician wants you to follow-up in: 2 TO 3 MONTHS WITH DR HARDING.    Studies Ordered:   No orders of the defined types were placed in this encounter.     [name], M.D., M.S. Interventional Cardiologist   Pager # [phone] Phone # [phone] [address] [place], [place] [address]

## 2017-11-05 ENCOUNTER — Encounter: Payer: Self-pay | Admitting: Cardiology

## 2017-11-05 NOTE — Assessment & Plan Note (Addendum)
Labile, difficult to control.  She remains on verapamil, Bystolic and irbesartan. Plan:   Increase Bystolic to 10 mg daily.    Add hydralazine 25 mg twice daily.  Plan will be for her to take twice daily hydralazine, and take the third dose as needed for SBP greater than 160.  If pressure is greater than 190, she will take 2 additional hydralazine doses.  I would like to consider converting from verapamil to amlodipine or felodipine to avoid bradycardia and potential Fatigue is a combination with Bystolic.  However for now I think we will use hydralazine

## 2017-11-05 NOTE — Assessment & Plan Note (Signed)
Probably related to hypertension with most likely some diastolic dysfunction.  Otherwise relatively normal echocardiogram.  Although there is evidence of bicuspid aortic valve, there is no significant stenosis, and her Myoview is negative for ischemia.

## 2017-11-05 NOTE — Assessment & Plan Note (Signed)
Followed by CT surgery. Continue blood pressure control.

## 2017-11-05 NOTE — Assessment & Plan Note (Signed)
Negative Myoview.  This may all be related to dysthymia. Continue to treat blood pressure. At this time will probably not pursue any further ischemic evaluation.

## 2017-11-10 ENCOUNTER — Encounter: Payer: Self-pay | Admitting: Internal Medicine

## 2017-11-11 ENCOUNTER — Telehealth: Payer: Self-pay | Admitting: Internal Medicine

## 2017-11-11 ENCOUNTER — Other Ambulatory Visit: Payer: Self-pay | Admitting: Internal Medicine

## 2017-11-11 MED ORDER — MEPERIDINE HCL 50 MG PO TABS: 50.0000 mg | ORAL_TABLET | ORAL | 0 refills | Status: DC | PRN

## 2017-11-14 NOTE — Telephone Encounter (Signed)
°  Relation to pt: self  Call back number: [phone] Pharmacy: [facility] [place], [address] AT [facility] [place] [phone] (Phone) [phone] (Fax)     Reason for call:  Patient states pharmacy requesting a new Rx for meperidine (DEMEROL) 50 MG tablet due to initial Rx being denied, please advise.  Patient has a follow up appointment with PCP 11/20/17.

## 2017-11-14 NOTE — Telephone Encounter (Signed)
Refill was sent in on 11/11/2017 for 30 days to get her to her appointment for pain management

## 2017-11-16 ENCOUNTER — Encounter: Payer: Self-pay | Admitting: Internal Medicine

## 2017-11-16 ENCOUNTER — Other Ambulatory Visit: Payer: Self-pay | Admitting: Internal Medicine

## 2017-11-17 ENCOUNTER — Encounter: Payer: Self-pay | Admitting: Internal Medicine

## 2017-11-17 ENCOUNTER — Telehealth: Payer: Self-pay | Admitting: Internal Medicine

## 2017-11-17 ENCOUNTER — Telehealth: Payer: Self-pay | Admitting: Cardiology

## 2017-11-17 NOTE — Telephone Encounter (Signed)
Patient aware of recommendations & advice. She voiced understanding. She will bring a list of her BP readings and cuff to her appt next week. Med list updated.

## 2017-11-17 NOTE — Telephone Encounter (Signed)
Have her stop the hydralazine and increase the bystolic to 20 mg daily.  Should start to see drop in BP in 4-5 days.   She is scheduled for PharmD appt on 1.29, so she needs to be sure and keep that appt.   If she develops any stroke like symptoms, severe headaches or cardiac problems she should go to [facility] or ER

## 2017-11-17 NOTE — Telephone Encounter (Unsigned)
Copied from [place]. Topic: Quick Communication - See Telephone Encounter >> Nov 17, 2017  3:03 PM Hewitt Shorts wrote: CRM for notification. See Telephone encounter for: pt is checking on status of her refill requst for mepridine Best number [phone]  11/17/17.

## 2017-11-17 NOTE — Telephone Encounter (Signed)
Please call asap,having some strong side effects from her Hyrdralazine.

## 2017-11-17 NOTE — Telephone Encounter (Signed)
Returned call to patient of Dr. Ellyn Hack. She started hydralazine on 1/7 Instructions: HYDRALAZINE 25 MG ONE TABLET TWICE DAILY- MAY TAKE AN EXTRA HYDRALAZINE  25 MG (3RD  DOSE THAT DAY) IF BLOOD PRESSURE SYSTOLIC NUMBER (TOP NUMBER) 160 TO 190   IF BLOOD PRESSURE IS GREATER THAN  190 -TAKE AN ADDITIONAL 50 MG OF HYDRALAZINE ( 3 RD DOSE THAT DAY)  Potential side effects: She reports chills + fever, shortness of breath, stinging & numbness, dizziness, no sleep in about 3 nights, muscle aches sore/tender to touch since starting medication  She reports her BP was running "well over 379 systolic". She reports her BP has consistently been running 558P-167O systolic.   Advised would route to [facility] and MD to review and advise on BP concerns and side effects

## 2017-11-17 NOTE — Telephone Encounter (Signed)
Can meperidine (DEMEROL) 50 MG tablet be resent? It appears that her pharmacy has not received it. Called patient to let her know (number on phone note was not the correct number) but had to leave a message.

## 2017-11-18 ENCOUNTER — Telehealth: Payer: Self-pay | Admitting: Internal Medicine

## 2017-11-18 MED ORDER — MEPERIDINE HCL 50 MG PO TABS: 50.0000 mg | ORAL_TABLET | ORAL | 0 refills | Status: DC | PRN

## 2017-11-18 NOTE — Telephone Encounter (Signed)
PA started on CoverMyMeds KEY: [id number]

## 2017-11-18 NOTE — Telephone Encounter (Signed)
Routed message to Dr. [name] to resend script in for patient.

## 2017-11-18 NOTE — Telephone Encounter (Signed)
Needing PA on medication (Myrbetriq) forwarding to MD assistant to proceed...[name]

## 2017-11-18 NOTE — Telephone Encounter (Signed)
Received fax to fill out information, filled out form and faxed it

## 2017-11-18 NOTE — Telephone Encounter (Signed)
Copied from [place] [id number]. Topic: Quick Communication - See Telephone Encounter >> Nov 18, 2017 10:47 AM Cleaster Corin, NT wrote: CRM for notification. See Telephone encounter for:   11/18/17. [employer] calling to ask questions about med. Sent fax but fax coming back busy.  (Myrbetriq 35m) [phone]>> Nov 18, 2017 11:03 AM SPercell BeltA wrote: Pt is upset that this was sent on Friday and this as not been done yet.  If they close this, there will be a lot or paperwork.  She wants it done today   Best number -[phone] Price difference 111.00  And 830.00 for 90 days

## 2017-11-18 NOTE — Telephone Encounter (Signed)
Copied from [place] [id number]. Topic: Quick Communication - See Telephone Encounter >> Nov 18, 2017 10:47 AM Cleaster Corin, NT wrote: CRM for notification. See Telephone encounter for:   11/18/17. [employer] calling to ask questions about med. Sent fax but fax coming back busy.  (Myrbetriq 3m) [phone]

## 2017-11-18 NOTE — Telephone Encounter (Signed)
Please see mychart message. Already addressed.

## 2017-11-18 NOTE — Telephone Encounter (Signed)
No PA is required per [employer], Dalene Seltzer w/ Auth department, no ref#, call back # [phone] DJM 4

## 2017-11-19 ENCOUNTER — Telehealth: Payer: Self-pay | Admitting: Internal Medicine

## 2017-11-19 NOTE — Telephone Encounter (Signed)
Sent patient mychart message to let us know what the other alternative is

## 2017-11-19 NOTE — Telephone Encounter (Signed)
Copied from [place]. Topic: Quick Communication - See Telephone Encounter >> Nov 19, 2017 11:50 AM Arletha Grippe wrote: CRM for notification. See Telephone encounter for:   11/19/17.[employer] medic[employer] called - the tier exception was denied for mirabegron ER (MYRBETRIQ) 50 MG TB24 tablet because pt has only tried one alternative that was on the formulary. She needs to try at least two alternatives before it will be covered.  Cb# [phone] option 5

## 2017-11-20 ENCOUNTER — Ambulatory Visit: Payer: Medicare Other | Admitting: Internal Medicine

## 2017-11-20 ENCOUNTER — Encounter: Payer: Self-pay | Admitting: Internal Medicine

## 2017-11-20 DIAGNOSIS — J4521 Mild intermittent asthma with (acute) exacerbation: Secondary | ICD-10-CM

## 2017-11-20 MED ORDER — PREDNISONE 20 MG PO TABS: 40.0000 mg | ORAL_TABLET | Freq: Every day | ORAL | 0 refills | Status: DC

## 2017-11-20 MED ORDER — METRONIDAZOLE 0.75 % EX GEL: 1.0000 "application " | Freq: Two times a day (BID) | CUTANEOUS | 0 refills | Status: DC

## 2017-11-20 NOTE — Progress Notes (Signed)
   Subjective:    Patient ID: Marissa [name], female    DOB: 09-12-1946, 72 y.o.   MRN: [id number]  HPI The patient is a 72 YO female coming in for some breathing concerns. She is having a lot of construction at her house and they are tearing down walls and mold etc. They are still living in the house during construction. She denies fevers or chills. Having cough and SOB. She is using her albuterol inhaler more often. This has been going on for about 1 month but she is feeling worse in the last several weeks. Is taking her allergy medication as well. Also tried over the counter benadryl which can help at night time.   Review of Systems  Constitutional: Positive for activity change. Negative for appetite change, chills, fatigue, fever and unexpected weight change.  HENT: Positive for postnasal drip and rhinorrhea. Negative for congestion, ear discharge, ear pain, sinus pressure, sinus pain, sneezing, sore throat, tinnitus, trouble swallowing and voice change.   Eyes: Negative.   Respiratory: Positive for cough, shortness of breath and wheezing. Negative for chest tightness.   Cardiovascular: Negative.   Gastrointestinal: Negative.   Neurological: Negative.       Objective:   Physical Exam  Constitutional: She is oriented to person, place, and time. She appears well-developed and well-nourished.  HENT:  Head: Normocephalic and atraumatic.  Oropharynx with redness and clear drainage, nose with swollen turbinates, TMs normal bilaterally  Eyes: EOM are normal.  Neck: Normal range of motion. No thyromegaly present.  Cardiovascular: Normal rate and regular rhythm.  Pulmonary/Chest: Effort normal. No respiratory distress. She has wheezes. She has no rales.  Abdominal: Soft.  Lymphadenopathy:    She has no cervical adenopathy.  Neurological: She is alert and oriented to person, place, and time.  Skin: Skin is warm and dry.   Vitals:   11/20/17 1049  BP: (!) 142/84  Pulse: (!) 57  Temp:  97.7 F (36.5 C)  TempSrc: Oral  SpO2: 100%  Weight: 137 lb (62.1 kg)  Height: 5' 4"  (1.626 m)      Assessment & Plan:

## 2017-11-20 NOTE — Patient Instructions (Addendum)
We have sent in a cream to use on the face. You can use it twice a day.   We have also sent in the prednisone to help and also start taking zyrtec or claritin daily.

## 2017-11-21 MED ORDER — TOLTERODINE TARTRATE ER 2 MG PO CP24: 2.0000 mg | ORAL_CAPSULE | Freq: Every day | ORAL | 0 refills | Status: DC

## 2017-11-21 NOTE — Assessment & Plan Note (Signed)
With flare today, prednisone prescribed today for flare. She will continue albuterol prn as well as singulair.

## 2017-11-25 ENCOUNTER — Ambulatory Visit: Payer: Medicare Other | Admitting: Pharmacist

## 2017-11-25 ENCOUNTER — Other Ambulatory Visit: Payer: Self-pay | Admitting: Cardiology

## 2017-11-25 VITALS — BP 190/88 | HR 56

## 2017-11-25 DIAGNOSIS — I10 Essential (primary) hypertension: Secondary | ICD-10-CM | POA: Diagnosis not present

## 2017-11-25 MED ORDER — CLONIDINE HCL 0.1 MG PO TABS: 0.1000 mg | ORAL_TABLET | ORAL | 0 refills | Status: DC | PRN

## 2017-11-25 MED ORDER — VALSARTAN 320 MG PO TABS: 320.0000 mg | ORAL_TABLET | ORAL | 1 refills | Status: DC

## 2017-11-25 NOTE — Patient Instructions (Signed)
Return for a a follow up appointment in 4 weeks in office (2 weeks by phone)  Check your blood pressure at home daily (if able) and keep record of the readings.  Take your BP meds as follows: *STOP taking Irbesartan 360m* *START taking valsartan 3248mevery morning* *TAKE clonidine 0.49m65ms needed for systolic blood pressure above 160 and headaches/symptoms* Okay to take up to 3 time in 24 hours  Bring all of your meds, your BP cuff and your record of home blood pressures to your next appointment.  Exercise as you're able, try to walk approximately 30 minutes per day.  Keep salt intake to a minimum, especially watch canned and prepared boxed foods.  Eat more fresh fruits and vegetables and fewer canned items.  Avoid eating in fast food restaurants.    HOW TO TAKE YOUR BLOOD PRESSURE: . Rest 5 minutes before taking your blood pressure. .  Don't smoke or drink caffeinated beverages for at least 30 minutes before. . Take your blood pressure before (not after) you eat. . Sit comfortably with your back supported and both feet on the floor (don't cross your legs). . Elevate your arm to heart level on a table or a desk. . Use the proper sized cuff. It should fit smoothly and snugly around your bare upper arm. There should be enough room to slip a fingertip under the cuff. The bottom edge of the cuff should be 1 inch above the crease of the elbow. . Ideally, take 3 measurements at one sitting and record the average.

## 2017-11-25 NOTE — Progress Notes (Signed)
Patient ID: Marissa [name]                 DOB: 1946-02-06                      MRN: [id number]     HPI: Marissa [name] is a 72 y.o. female referred by Dr. Zadie Rhine to HTN clinic. PMH includes HTN, thoracis aneurysm,aortic stenosis, asthma, severe neurological urinary incontinence, heart valve regurgitation, hyperlipidemia, chronic pain, and migraine.  During most recent office visit with DR Ellyn Hack on 5/0/5397 her Bystolic dose was increased to 58m daily and hydralazine 228mwas initiated. Two weeks after initiating hydralazine patient reports redness, swelling, and weakness. Hydralazine was discontinued on 11/03/71/41nd Bystolic dose was increased to 2066maily. Patient presents today for follow up. Denies problems with dizziness, chest pain, increased fatigue or swelling.  Current HTN meds:  Irbesartan 300m50mily every morning Nebivolol 20mg59mly at noon Verapamil SR 240mg 25my every evening  Previously tried:  Valsartan 160mg d65m - stopped due to recall  Intolerance: Diuretics (hydralazine and chlorthalidone) - low sodium Hydralazine - join pian, red skin, weakness  BP goal: 130/80 (140/90 due to hx of dizziness and fainting with lower BP)  Family History: no significant from mother and father, heart disease from uncle  Social History: scotch 2-3x day to help with her pain  Diet: decreased appetite , working on balanced diet  Exercise: hip pain and injection needed  Home BP readings:  12 readying after stopping hydralazine; average 170/90 (54-74bpm) Noted readying of 207/121 on 11/18/17 at 7pm  Wt Readings from Last 3 Encounters:  11/20/17 137 lb (62.1 kg)  11/03/17 138 lb 9.6 oz (62.9 kg)  08/12/17 138 lb (62.6 kg)   BP Readings from Last 3 Encounters:  11/25/17 (!) 190/88  11/20/17 (!) 142/84  11/03/17 (!) 196/98   Pulse Readings from Last 3 Encounters:  11/25/17 (!) 56  11/20/17 (!) 57  11/03/17 68    Past Medical History:  Diagnosis Date  . Arthritis   .  Asthma   . Bicuspid aortic valve   . Bursitis   . Cataract    mild  . Dysthymia   . Heart valve regurgitation   . Hemorrhoids   . Hx of ulcerative colitis    per dr jenkinsArnoldo Morale pt.  . Hyperlipidemia   . Hypertension    managed - labile.  . Migraine   . Scoliosis   . 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 Spinal stenosis   . Thoracic aortic aneurysm (HCC)    Stable 4.2-4.3 cm (followed by Dr. HendricRoxan Hockeyyroid disease     Current Outpatient Medications on File Prior to Visit  Medication Sig Dispense Refill  . Nebivolol HCl 20 MG TABS Take 20 mg by mouth daily.    . albutMarland Kitchenrol (PROAIR HFA) 108 (90 Base) MCG/ACT inhaler INHALE 2 PUFFS INTO THE LUNGS EVERY 6 (SIX) HOURS AS NEEDED. 8.5 each 2  . carisoprodol (SOMA) 350 MG tablet TAKE 1 TABLET BY MOUTH THREE TIMES DAILY AS NEEDED 60 tablet 0  . cycloSPORINE (RESTASIS) 0.05 % ophthalmic emulsion Place 1 drop into both eyes 2 (two) times daily.    . Eszopiclone 3 MG TABS Take 1 tablet (3 mg total) by mouth at bedtime. Take immediately before  bedtime 30 tablet 5  . famotidine (PEPCID) 20 MG tablet Take 20 mg by mouth at bedtime.    [name] Kitchen FLUoxetine (PROZAC) 40 MG capsule Take 1 capsule (40 mg total) by mouth 2 (two) times daily. 60 capsule 5  . irbesartan (AVAPRO) 300 MG tablet TAKE 1 TABLET(300 MG) BY MOUTH DAILY 90 tablet 1  . levothyroxine (SYNTHROID, LEVOTHROID) 125 MCG tablet TAKE 1 TABLET(125 MCG) BY MOUTH DAILY 90 tablet 0  . meperidine (DEMEROL) 50 MG tablet Take 1 tablet (50 mg total) by mouth every 4 (four) hours as needed for severe pain. 30 tablet 0  . metroNIDAZOLE (METROGEL) 0.75 % gel Apply 1 application topically 2 (two) times daily. 45 g 0  . MINIVELLE 0.05 MG/24HR patch Place 1 patch onto the skin 2 (two) times a week.   3  . montelukast (SINGULAIR) 10 MG tablet TAKE 1 TABLET(10 MG) BY MOUTH AT  BEDTIME 90 tablet 0  . montelukast (SINGULAIR) 10 MG tablet TAKE 1 TABLET BY MOUTH EVERY NIGHT AT BEDTIME 90 tablet 0  . MYRBETRIQ 50 MG TB24 tablet TAKE 1 TABLET(50 MG) BY MOUTH DAILY 90 tablet 0  . omeprazole (PRILOSEC) 20 MG capsule Take 20 mg by mouth daily before breakfast.    . predniSONE (DELTASONE) 20 MG tablet Take 2 tablets (40 mg total) by mouth daily with breakfast. 14 tablet 0  . Respiratory Therapy Supplies (FLUTTER) DEVI Use as directed 1 each 0  . tolterodine (DETROL LA) 2 MG 24 hr capsule Take 1 capsule (2 mg total) by mouth daily. 30 capsule 0  . verapamil (CALAN-SR) 240 MG CR tablet TAKE 1 TABLET BY MOUTH EVERY NIGHT AT BEDTIME 90 tablet 0  . Vitamin D, Ergocalciferol, (DRISDOL) 50000 units CAPS capsule TAKE 1 CAPSULE BY MOUTH EVERY 7 DAYS 12 capsule 0  . XIIDRA 5 % SOLN INT 1 GTT IN OU BID  4   No current facility-administered medications on file prior to visit.     Allergies  Allergen Reactions  . Fentanyl Shortness Of Breath and Rash  . Amoxicillin Diarrhea    Severe diarrhea, rash to vaginal area with swelling   . Hctz [Hydrochlorothiazide] Other (See Comments)    HypoNatremia  . Hydralazine     Weak, sweats, red skin  . Codeine Other (See Comments)    makes her hyper  . Conjugated Estrogens Itching and Rash  . Erythromycin Rash    had a rash with emycin, has done ok with other meds in it's class  . Piroxicam Itching and Rash    Feldene    Blood pressure (!) 190/88, pulse (!) 56, SpO2 99 %.  Essential hypertension Patient blood pressure remain poorly controlled. She developed severe ADR with hydralazine and diuretics. Bystolic now at maximum dose due to HR in the 50s. Also noted better BP readying in the past while on valsartan. Will change Irbesartan to Valsartan 345m every morning, continue Bystolic and verapamil as previously prescribed, and initiate cloinidine 0.180mPRN for BP >160.  Patient continues to work on pain management with PCP. Decrease  alcohol intake was also discussed during this office visit. Plan to follow up in 2 weeks, and maybe add spironolactone to therapy if BP remains above desired goal range.  Izaiyah Kleinman Rodriguez-Guzman PharmD, BCPS, CP[id number]/30/2019 9:40 PM

## 2017-11-26 ENCOUNTER — Encounter: Payer: Self-pay | Admitting: Pharmacist

## 2017-11-26 NOTE — Assessment & Plan Note (Addendum)
Patient blood pressure remain poorly controlled. She developed severe ADR with hydralazine and diuretics. Bystolic now at maximum dose due to HR in the 50s. Also noted better BP readying in the past while on valsartan. Will change Irbesartan to Valsartan 318m every morning, continue Bystolic and verapamil as previously prescribed, and initiate cloinidine 0.118mPRN for BP >160.  Patient continues to work on pain management with PCP. Decrease alcohol intake was also discussed during this office visit. Plan to follow up in 2 weeks, and maybe add spironolactone to therapy if BP remains above desired goal range.

## 2017-11-27 ENCOUNTER — Other Ambulatory Visit (INDEPENDENT_AMBULATORY_CARE_PROVIDER_SITE_OTHER): Payer: Medicare Other

## 2017-11-27 ENCOUNTER — Encounter: Payer: Self-pay | Admitting: Internal Medicine

## 2017-11-27 ENCOUNTER — Telehealth: Payer: Self-pay | Admitting: Pharmacist

## 2017-11-27 ENCOUNTER — Other Ambulatory Visit: Payer: Self-pay | Admitting: Cardiology

## 2017-11-27 ENCOUNTER — Ambulatory Visit: Payer: Medicare Other | Admitting: Internal Medicine

## 2017-11-27 VITALS — BP 144/80 | HR 52 | Temp 98.3°F | Ht 64.0 in | Wt 141.0 lb

## 2017-11-27 DIAGNOSIS — G8929 Other chronic pain: Secondary | ICD-10-CM

## 2017-11-27 DIAGNOSIS — F1120 Opioid dependence, uncomplicated: Secondary | ICD-10-CM

## 2017-11-27 DIAGNOSIS — M5416 Radiculopathy, lumbar region: Secondary | ICD-10-CM

## 2017-11-27 DIAGNOSIS — Z0289 Encounter for other administrative examinations: Secondary | ICD-10-CM

## 2017-11-27 LAB — CBC
HEMATOCRIT: 35.4 % — AB (ref 36.0–46.0)
HEMOGLOBIN: 12.2 g/dL (ref 12.0–15.0)
MCHC: 34.4 g/dL (ref 30.0–36.0)
MCV: 93.6 fl (ref 78.0–100.0)
Platelets: 321 10*3/uL (ref 150.0–400.0)
RBC: 3.78 Mil/uL — ABNORMAL LOW (ref 3.87–5.11)
RDW: 13.4 % (ref 11.5–15.5)
WBC: 7.2 10*3/uL (ref 4.0–10.5)

## 2017-11-27 LAB — COMPREHENSIVE METABOLIC PANEL
ALK PHOS: 118 U/L — AB (ref 39–117)
ALT: 314 U/L — ABNORMAL HIGH (ref 0–35)
AST: 45 U/L — ABNORMAL HIGH (ref 0–37)
Albumin: 4 g/dL (ref 3.5–5.2)
BUN: 12 mg/dL (ref 6–23)
CO2: 29 meq/L (ref 19–32)
Calcium: 8.7 mg/dL (ref 8.4–10.5)
Chloride: 100 mEq/L (ref 96–112)
Creatinine, Ser: 0.65 mg/dL (ref 0.40–1.20)
GFR: 94.97 mL/min (ref >60.00)
GLUCOSE: 94 mg/dL (ref 70–99)
POTASSIUM: 4.2 meq/L (ref 3.5–5.1)
SODIUM: 135 meq/L (ref 135–145)
TOTAL PROTEIN: 6.2 g/dL (ref 6.0–8.3)
Total Bilirubin: 0.5 mg/dL (ref 0.2–1.2)

## 2017-11-27 LAB — TSH: TSH: 0.78 u[IU]/mL (ref 0.35–4.50)

## 2017-11-27 LAB — VITAMIN B12

## 2017-11-27 LAB — VITAMIN D 25 HYDROXY (VIT D DEFICIENCY, FRACTURES): VITD: 42.11 ng/mL (ref 30.00–100.00)

## 2017-11-27 MED ORDER — NEBIVOLOL HCL 20 MG PO TABS: 20.0000 mg | ORAL_TABLET | Freq: Every day | ORAL | 2 refills | Status: DC

## 2017-11-27 NOTE — Progress Notes (Signed)
   Subjective:    Patient ID: Marissa [name], female    DOB: [birth year], 72 y.o.   MRN: [id number]  HPI The patient is a 72 YO female coming in for pain management. She is taking demerol daily for pain chronic. Previously had not been on contract and used initially for cough but it helped her back and fibromyalgia as well. She denies taking too much medication or obtaining for additional sources. She has tried many things over the years for the pain without relief. She is not on disability. She does not work. Is able to get out of bed in the morning and do ADLs with the medication.   [place] narcotic database reviewed and no inappropriate fills.   Review of Systems  Constitutional: Positive for activity change. Negative for appetite change, chills, fatigue, fever and unexpected weight change.  HENT: Negative.   Eyes: Negative.   Respiratory: Negative.   Cardiovascular: Negative.   Gastrointestinal: Negative.   Musculoskeletal: Positive for arthralgias, back pain and myalgias. Negative for gait problem and joint swelling.  Skin: Negative.   Neurological: Negative.   Hematological: Negative.   Psychiatric/Behavioral: Negative.       Objective:   Physical Exam  Constitutional: She is oriented to person, place, and time. She appears well-developed and well-nourished.  HENT:  Head: Normocephalic and atraumatic.  Eyes: EOM are normal.  Neck: Normal range of motion.  Cardiovascular: Normal rate and regular rhythm.  Pulmonary/Chest: Effort normal and breath sounds normal. No respiratory distress. She has no wheezes. She has no rales.  Abdominal: Soft. Bowel sounds are normal. She exhibits no distension. There is no tenderness. There is no rebound.  Musculoskeletal: She exhibits tenderness. She exhibits no edema.  Low back tenderness  Neurological: She is alert and oriented to person, place, and time. Coordination normal.  Skin: Skin is warm and dry.  Psychiatric: She has a normal mood and  affect.   Vitals:   11/27/17 1043  BP: (!) 144/80  Pulse: (!) 52  Temp: 98.3 F (36.8 C)  TempSrc: Oral  SpO2: 100%  Weight: 141 lb (64 kg)  Height: 5' 4"  (1.626 m)      Assessment & Plan:

## 2017-11-27 NOTE — Telephone Encounter (Signed)
Patient called to clarify dose of valsartan and verapamil.   LMOM; Taking should be taking :  Continue Verapamil 228m every evening  Continue taking Bystolic 288HVdaily  STOP  irbesartan 3061m START valsartan 32034maily

## 2017-11-27 NOTE — Telephone Encounter (Signed)
Per PN dated 11-25-17 pharmacist Maryan Rued):  Current HTN meds:  Irbesartan 312m daily every morning Nebivolol (Bystolic)294mdaily at noon Verapamil SR 24022maily every evening

## 2017-11-27 NOTE — Patient Instructions (Addendum)
We will get the myrbetriq approved.   We will get the copy of the pain contract to you today and see you back in 3 months.

## 2017-11-28 ENCOUNTER — Other Ambulatory Visit: Payer: Self-pay | Admitting: Internal Medicine

## 2017-11-28 DIAGNOSIS — F1120 Opioid dependence, uncomplicated: Secondary | ICD-10-CM | POA: Insufficient documentation

## 2017-11-28 DIAGNOSIS — R7989 Other specified abnormal findings of blood chemistry: Secondary | ICD-10-CM

## 2017-11-28 DIAGNOSIS — Z0289 Encounter for other administrative examinations: Secondary | ICD-10-CM | POA: Insufficient documentation

## 2017-11-28 DIAGNOSIS — R945 Abnormal results of liver function studies: Principal | ICD-10-CM

## 2017-11-28 DIAGNOSIS — G8929 Other chronic pain: Secondary | ICD-10-CM | POA: Insufficient documentation

## 2017-11-28 NOTE — Assessment & Plan Note (Signed)
Pain contract signed and reviewed expectation of visits every 3 months. [place] narcotic database reviewed and no inappropriate fills. UDS obtained today as none in the last year. Refill is up to date and not done today. Will e-prescribe until follow up within 3 months as long as UDS appropriate.

## 2017-11-28 NOTE — Assessment & Plan Note (Signed)
She does take demerol 1 pill daily for this pain.This medication allows them to do the following ADLs: cooking, cleaning, dressing. They also have seen ortho and sports medicine for this. They deny taking too much medication in the last 3 months.

## 2017-11-28 NOTE — Assessment & Plan Note (Signed)
Reviewed pain contract and expectations with the patient at today's visit and pain contract signed for #30 demerol per month to her single pharmacy. [place] narcotic database reviewed and no inappropriate fills.

## 2017-11-28 NOTE — Assessment & Plan Note (Signed)
Rx for demerol up to date and can have #30 per month until follow up in 3 months. [place] narcotic database reviewed and updated and no inappropriate fills. She agrees to pain contract today and is signed.

## 2017-11-29 ENCOUNTER — Encounter: Payer: Self-pay | Admitting: Internal Medicine

## 2017-11-30 LAB — PAIN MGMT, PROFILE 8 W/CONF, U
6 Acetylmorphine: NEGATIVE ng/mL (ref <10)
ALCOHOL METABOLITES: POSITIVE ng/mL — AB (ref <500)
AMPHETAMINES: NEGATIVE ng/mL (ref <500)
BENZODIAZEPINES: NEGATIVE ng/mL (ref <100)
BUPRENORPHINE, URINE: NEGATIVE ng/mL (ref <5)
COCAINE METABOLITE: NEGATIVE ng/mL (ref <150)
CREATININE: 52 mg/dL
Ethyl Glucuronide (ETG): 14058 ng/mL — ABNORMAL HIGH (ref <500)
Ethyl Sulfate (ETS): 1018 ng/mL — ABNORMAL HIGH (ref <100)
MDMA: NEGATIVE ng/mL (ref <500)
Marijuana Metabolite: NEGATIVE ng/mL (ref <20)
OXIDANT: NEGATIVE ug/mL (ref <200)
Opiates: NEGATIVE ng/mL (ref <100)
Oxycodone: NEGATIVE ng/mL (ref <100)
PH: 6.85 (ref 4.5–9.0)

## 2017-12-03 ENCOUNTER — Ambulatory Visit: Payer: Medicare Other | Admitting: Cardiology

## 2017-12-08 DIAGNOSIS — H35363 Drusen (degenerative) of macula, bilateral: Secondary | ICD-10-CM | POA: Diagnosis not present

## 2017-12-08 DIAGNOSIS — H35033 Hypertensive retinopathy, bilateral: Secondary | ICD-10-CM | POA: Diagnosis not present

## 2017-12-08 DIAGNOSIS — H40013 Open angle with borderline findings, low risk, bilateral: Secondary | ICD-10-CM | POA: Diagnosis not present

## 2017-12-08 DIAGNOSIS — H35371 Puckering of macula, right eye: Secondary | ICD-10-CM | POA: Diagnosis not present

## 2017-12-13 ENCOUNTER — Encounter: Payer: Self-pay | Admitting: Internal Medicine

## 2017-12-16 ENCOUNTER — Ambulatory Visit: Payer: Medicare Other | Admitting: Internal Medicine

## 2017-12-18 ENCOUNTER — Other Ambulatory Visit: Payer: Self-pay | Admitting: Family

## 2017-12-18 ENCOUNTER — Encounter: Payer: Self-pay | Admitting: Internal Medicine

## 2017-12-18 ENCOUNTER — Other Ambulatory Visit: Payer: Self-pay | Admitting: Internal Medicine

## 2017-12-19 MED ORDER — MEPERIDINE HCL 50 MG PO TABS: 50.0000 mg | ORAL_TABLET | ORAL | 0 refills | Status: DC | PRN

## 2017-12-19 NOTE — Telephone Encounter (Signed)
Control database checked last refill: 11/18/2017 LOV: 11/27/2017

## 2017-12-23 ENCOUNTER — Telehealth: Payer: Self-pay | Admitting: Pharmacist

## 2017-12-23 NOTE — Telephone Encounter (Signed)
Blood pressure better with some days at 808U systolic but mainly in 110R.  - Using clonidine 0.48m only as needed - last dose over 3 days ago  - tolerating all other medication well   - Still taking valsartan and Bystolic as prescribed    Will follow up with Dr HEllyn Hackin 3 weeks as previously scheduled.    Marissa [name] PharmD, BCPS, [id number]/26/2019 3:07 PM

## 2017-12-26 ENCOUNTER — Telehealth: Payer: Self-pay | Admitting: Internal Medicine

## 2017-12-26 ENCOUNTER — Other Ambulatory Visit: Payer: Self-pay | Admitting: Internal Medicine

## 2017-12-26 NOTE — Telephone Encounter (Signed)
Copied from [place] [id number]. Topic: Quick Communication - See Telephone Encounter >> Dec 26, 2017  3:59 PM Conception Chancy, NT wrote: CRM for notification. See Telephone encounter for:  12/26/17.  [facility] pharmacy is calling and states she received a rx for Demerol and they are unable to fill that because that medication is being discontinued. They are needing a different script that works the same called in.  [facility] [place] [facility] [place], [address] AT [facility] [address]  [place] [address]  Phone: [phone] Fax: [phone]

## 2017-12-29 ENCOUNTER — Other Ambulatory Visit: Payer: Self-pay | Admitting: Cardiology

## 2017-12-29 ENCOUNTER — Other Ambulatory Visit: Payer: Self-pay | Admitting: Family

## 2017-12-29 NOTE — Telephone Encounter (Signed)
Appointment scheduled.

## 2017-12-29 NOTE — Telephone Encounter (Signed)
Patient would need visit. We cannot change her narcotic without a visit.

## 2017-12-29 NOTE — Telephone Encounter (Signed)
Can you please make patient an appointment concerning her narcotic being discontinued. Thank you

## 2017-12-30 ENCOUNTER — Other Ambulatory Visit: Payer: Self-pay | Admitting: Internal Medicine

## 2017-12-30 ENCOUNTER — Other Ambulatory Visit (INDEPENDENT_AMBULATORY_CARE_PROVIDER_SITE_OTHER): Payer: Medicare Other

## 2017-12-30 ENCOUNTER — Ambulatory Visit: Payer: Medicare Other | Admitting: Internal Medicine

## 2017-12-30 ENCOUNTER — Encounter: Payer: Self-pay | Admitting: Internal Medicine

## 2017-12-30 DIAGNOSIS — R7989 Other specified abnormal findings of blood chemistry: Secondary | ICD-10-CM

## 2017-12-30 DIAGNOSIS — M5416 Radiculopathy, lumbar region: Secondary | ICD-10-CM | POA: Diagnosis not present

## 2017-12-30 DIAGNOSIS — R945 Abnormal results of liver function studies: Secondary | ICD-10-CM | POA: Diagnosis not present

## 2017-12-30 LAB — HEPATIC FUNCTION PANEL
ALBUMIN: 4.3 g/dL (ref 3.5–5.2)
ALT: 27 U/L (ref 0–35)
AST: 25 U/L (ref 0–37)
Alkaline Phosphatase: 73 U/L (ref 39–117)
Bilirubin, Direct: 0.1 mg/dL (ref 0.0–0.3)
TOTAL PROTEIN: 6.9 g/dL (ref 6.0–8.3)
Total Bilirubin: 0.5 mg/dL (ref 0.2–1.2)

## 2017-12-30 MED ORDER — MIRABEGRON ER 50 MG PO TB24: 50.0000 mg | ORAL_TABLET | Freq: Every day | ORAL | 1 refills | Status: DC

## 2017-12-30 MED ORDER — MONTELUKAST SODIUM 10 MG PO TABS: 10.0000 mg | ORAL_TABLET | Freq: Every day | ORAL | 1 refills | Status: DC

## 2017-12-30 MED ORDER — MEPERIDINE HCL 50 MG PO TABS: 50.0000 mg | ORAL_TABLET | ORAL | 0 refills | Status: DC | PRN

## 2017-12-30 MED ORDER — CARISOPRODOL 350 MG PO TABS: 350.0000 mg | ORAL_TABLET | Freq: Three times a day (TID) | ORAL | 5 refills | Status: DC | PRN

## 2017-12-30 MED ORDER — DOXYCYCLINE HYCLATE 100 MG PO TABS: 100.0000 mg | ORAL_TABLET | Freq: Every day | ORAL | 0 refills | Status: DC

## 2017-12-30 MED ORDER — TAPENTADOL HCL ER 50 MG PO TB12: 50.0000 mg | ORAL_TABLET | Freq: Two times a day (BID) | ORAL | 0 refills | Status: DC

## 2017-12-30 NOTE — Patient Instructions (Addendum)
We will try a new medicine called nucynta which is generally a twice a day medicine. Try it first at night time to see how it will affect you.  We have sent in the soma refill.   We have sent in the doxycycline for the face to take 1 pill daily for 1 month to calm down the face.

## 2017-12-31 ENCOUNTER — Other Ambulatory Visit: Payer: Self-pay | Admitting: Cardiology

## 2017-12-31 DIAGNOSIS — M25552 Pain in left hip: Secondary | ICD-10-CM | POA: Diagnosis not present

## 2017-12-31 DIAGNOSIS — M1612 Unilateral primary osteoarthritis, left hip: Secondary | ICD-10-CM | POA: Diagnosis not present

## 2017-12-31 DIAGNOSIS — M545 Low back pain, unspecified: Secondary | ICD-10-CM | POA: Insufficient documentation

## 2017-12-31 LAB — HEPATITIS PANEL, ACUTE
Hep A IgM: NONREACTIVE
Hep B C IgM: NONREACTIVE
Hepatitis B Surface Ag: NONREACTIVE
Hepatitis C Ab: NONREACTIVE
SIGNAL TO CUT-OFF: 0.01 (ref <1.00)

## 2017-12-31 LAB — HEPATITIS B SURFACE ANTIBODY,QUALITATIVE: Hep B S Ab: NONREACTIVE

## 2018-01-01 ENCOUNTER — Encounter: Payer: Self-pay | Admitting: Internal Medicine

## 2018-01-02 ENCOUNTER — Encounter: Payer: Self-pay | Admitting: Internal Medicine

## 2018-01-02 NOTE — Progress Notes (Signed)
   Subjective:    Patient ID: Marissa [name], female    DOB: 08-13-1946, 71 y.o.   MRN: [id number]  HPI The patient is a 72 YO female coming in for neck and back pain. She previously had been taking demerol daily for the pain. She has been told that it is discontinued. She has had poor reaction to fentanyl, codeine in the past with anaphylaxis and cannot take those medications. She has also used tylenol and nsaids which do not do as well for pain. She has not seen a back specialist in some time. She denies taking too much medication or taking other people's medications.   Review of Systems  Constitutional: Positive for activity change. Negative for appetite change, chills, fatigue, fever and unexpected weight change.  Respiratory: Negative.   Cardiovascular: Negative.   Gastrointestinal: Negative.   Musculoskeletal: Positive for arthralgias, back pain and myalgias. Negative for gait problem and joint swelling.  Skin: Negative.   Neurological: Negative.       Objective:   Physical Exam  Constitutional: She is oriented to person, place, and time. She appears well-developed and well-nourished.  HENT:  Head: Normocephalic and atraumatic.  Eyes: EOM are normal.  Neck: Normal range of motion.  Cardiovascular: Normal rate and regular rhythm.  Pulmonary/Chest: Effort normal and breath sounds normal. No respiratory distress. She has no wheezes. She has no rales.  Abdominal: Soft. She exhibits no distension. There is no tenderness. There is no rebound.  Musculoskeletal: She exhibits tenderness. She exhibits no edema.  Neurological: She is alert and oriented to person, place, and time. Coordination normal.  Skin: Skin is warm and dry.   Vitals:   12/30/17 1323  BP: 130/80  Pulse: (!) 56  Temp: 98.6 F (37 C)  TempSrc: Oral  SpO2: 99%  Weight: 137 lb (62.1 kg)  Height: 5' 4"  (1.626 m)      Assessment & Plan:

## 2018-01-02 NOTE — Assessment & Plan Note (Signed)
She has been taking 1 pill demerol daily for the pain which has been discontinued. She will change to nucynta 50 mg BID prn for the pain. She knows to take only as needed. Come back in 1-2 months for follow up of efficacy. Given her anaphylaxis to fentanyl and poor reaction to codeine I will not prescribe her any opioids or codeine products.

## 2018-01-06 ENCOUNTER — Ambulatory Visit: Payer: Medicare Other | Admitting: Cardiology

## 2018-01-06 ENCOUNTER — Encounter: Payer: Self-pay | Admitting: Cardiology

## 2018-01-06 VITALS — BP 141/72 | HR 62 | Ht 64.5 in | Wt 135.6 lb

## 2018-01-06 DIAGNOSIS — R0609 Other forms of dyspnea: Secondary | ICD-10-CM

## 2018-01-06 DIAGNOSIS — I712 Thoracic aortic aneurysm, without rupture, unspecified: Secondary | ICD-10-CM

## 2018-01-06 DIAGNOSIS — I350 Nonrheumatic aortic (valve) stenosis: Secondary | ICD-10-CM | POA: Diagnosis not present

## 2018-01-06 DIAGNOSIS — I10 Essential (primary) hypertension: Secondary | ICD-10-CM

## 2018-01-06 DIAGNOSIS — R0600 Dyspnea, unspecified: Secondary | ICD-10-CM

## 2018-01-06 MED ORDER — CLONIDINE HCL 0.1 MG PO TABS: 0.1000 mg | ORAL_TABLET | Freq: Every day | ORAL | 3 refills | Status: DC

## 2018-01-06 NOTE — Progress Notes (Signed)
PCP: [name], MD  Clinic Note: Chief Complaint  Patient presents with  . Follow-up  . Hypertension    HPI:  Marissa [name] [name] is a 72 y.o. female with a history bicuspid aortic valve and hypertension as well as thoracic aortic aneurysm who is seen for delayed follow-up after initial evaluation for chest discomfort and dyspnea associated with labile/difficult to manage HTN. She also noted palpitations. -- Her original primary doctor had difficulty managing her hypertension having to use valsartan or verapamil because of intolerance of medications. Along with hypertension, she has hyperlipidemia, ulcerative colitis, scoliosis, spinal stenosis as well as asthma and arthritis. There is also suggestive history of mitral prolapse.  She has been followed by Dr. Roxan Hockey for an ascending aortic aneurysm and heart murmur with loud click -since 3532.  The dilation has remained stable at the 4.2-4.3 cm level. I first saw her back in October 2018: she noted persistently elevated blood pressures in the 190 mmHg range, but as low as 110.  This was also associated with occasional palpitations.  In addition to that she noted some deep aching sensations in her chest off and on throughout the day worse with exertion.  As a result, I decided to order a Myoview stress test.   Marissa [name] [name] was last seen in January 2019.  Blood pressures are looking better.  We discussed the results of her stress test.  She noted some fluttering and occasional dizziness as well as weakness.  Medication adjustments were made and she was referred to [facility].  ([facility] -our pharmacist run hypertension, lipid and anticoagulation clinic)  Recent Hospitalizations: None  Studies Personally Reviewed - (if available, images/films reviewed: From Epic Chart or Care Everywhere)  No new studies  Interval History: Marissa [name] [name] returns for follow-up of her labile hypertension.  She tells me that  she has had blood pressures ranging as high as 200/100 this past weekend.  She actually went to emergency room and was started on as needed clonidine.  She has used that may be 10 times in the last 3-4 weeks.  She does feel short of breath and clammy and little bit nervous when her blood pressure goes high.  She occasionally still has some chest discomfort associated with is well, but once the blood pressure goes back down she feels fine.  She brings me her home records of her blood pressure ranging anywhere from 193/109 and 202/104 down to 135/72.  Heart rates are mostly in the 50s. When her blood pressures are stable, she is usually doing pretty well overall.  No shortness of breath or chest tightness/pressure.  She is not really sure what triggers these things, but it may very well be her musculoskeletal pain.  She says she sometimes feels palpitations, but not as much now during these episodes.  Despite having high blood pressure she does not necessarily have headaches or blurred vision associated with it either.  No TIA or amaurosis fugax symptoms.  No syncope or near syncope.  No claudication.  No PND, orthopnea or edema.  She just gets a little dizzy and lightheaded with headaches when her blood pressure still high.  May be some mild vision issues if the blood pressures really high.  But for the most part has been stable.   ROS: A comprehensive was performed. Review of Systems  Constitutional: Positive for malaise/fatigue. Negative for chills and fever.  HENT: Negative for congestion and nosebleeds.   Respiratory: Positive  for shortness of breath. Negative for cough and wheezing.   Cardiovascular: Positive for chest pain (Per history of present illness).  Gastrointestinal: Negative for blood in stool and melena.  Genitourinary: Negative for dysuria and hematuria.  Musculoskeletal: Positive for joint pain and myalgias.       Chronic fatigue and fibromyalgia pains  Skin: Negative.     Neurological: Positive for dizziness and headaches. Negative for focal weakness and loss of consciousness.  Psychiatric/Behavioral: The patient is nervous/anxious.   All other systems reviewed and are negative.   I have reviewed and (if needed) personally updated the patient's problem list, medications, allergies, past medical and surgical history, social and family history.   Past Medical History:  Diagnosis Date  . Arthritis   . Asthma   . Bicuspid aortic valve   . Bursitis   . Cataract    mild  . Dysthymia   . Heart valve regurgitation   . Hemorrhoids   . Hx of ulcerative colitis    per dr Arnoldo Morale as per pt.  . Hyperlipidemia   . Hypertension    managed - labile.  . Migraine   . Scoliosis   . 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 Spinal stenosis   . Thoracic aortic aneurysm (HCC)    Stable 4.2-4.3 cm (followed by Dr. Roxan Hockey)  . Thyroid disease     Past Surgical History:  Procedure Laterality Date  . ABDOMINAL HYSTERECTOMY    . BUNIONECTOMY    . Cardiac Event Monitor  07/2017   Overall relatively normal.  Normal sinus rhythm with rare bradycardia and tachycardia.  Heart rate ranged from 55-110 bpm.  Occasional PACs and PVCs, every single 1 was felt.  No arrhythmias other than one short 4 beat run of PACs.  . COLONOSCOPY  02-04-2005   all normal   . FOOT SURGERY     3 pins in toes   . HAND SURGERY     left thumb joint resection  . nasal revision    . NM MYOVIEW LTD  07/2017   LOW RISK study.  No ischemia or infarction.  EF greater than 65%.   [name] Kitchen SINUS IRRIGATION    . TONSILLECTOMY AND ADENOIDECTOMY    . TRANSTHORACIC ECHOCARDIOGRAM  06/'1, 8/'19   a) Moderate LVH. Normal EF 60-65%. Normal diastolic parameters. --> Difficult to fully visualize the aortic valve. Cannot exclude bicuspid valve. Mild aortic stenosis noted. No PFO. Mildly dilated  left atrium. Trivial MR. No comment on mitral valve prolapse. Moderately dilated ascending aorta.;; b) F/u Echo To evaluate the aortic valve. -- Bicuspid AoV - mildly thickened / calcified. - No stenosis    Current Meds  Medication Sig  . albuterol (PROAIR HFA) 108 (90 Base) MCG/ACT inhaler INHALE 2 PUFFS INTO THE LUNGS EVERY 6 (SIX) HOURS AS NEEDED.  . carisoprodol (SOMA) 350 MG tablet Take 1 tablet (350 mg total) by mouth 3 (three) times daily as needed.  . cloNIDine (CATAPRES) 0.1 MG tablet Take 1 tablet (0.1 mg total) by mouth at bedtime. May take an extra tablet if systolic blood pressure is greater than 160  . cycloSPORINE (RESTASIS) 0.05 % ophthalmic emulsion Place 1 drop into both eyes 2 (two) times daily.  [name] Kitchen doxycycline (VIBRA-TABS) 100 MG tablet Take 1 tablet (100 mg total) by mouth daily.  . Eszopiclone 3 MG TABS Take  1 tablet (3 mg total) by mouth at bedtime. Take immediately before bedtime  . famotidine (PEPCID) 20 MG tablet Take 20 mg by mouth at bedtime.  [name] Kitchen FLUoxetine (PROZAC) 40 MG capsule Take 1 capsule (40 mg total) by mouth 2 (two) times daily.  [name] Kitchen levothyroxine (SYNTHROID, LEVOTHROID) 125 MCG tablet TAKE 1 TABLET(125 MCG) BY MOUTH DAILY  . metroNIDAZOLE (METROGEL) 0.75 % gel Apply 1 application topically 2 (two) times daily.  [name] Kitchen MINIVELLE 0.05 MG/24HR patch Place 1 patch onto the skin 2 (two) times a week.   . mirabegron ER (MYRBETRIQ) 50 MG TB24 tablet Take 1 tablet (50 mg total) by mouth daily.  . montelukast (SINGULAIR) 10 MG tablet Take 1 tablet (10 mg total) by mouth at bedtime.  . Nebivolol HCl 20 MG TABS Take 1 tablet (20 mg total) by mouth daily.  [name] Kitchen omeprazole (PRILOSEC) 20 MG capsule Take 20 mg by mouth daily before breakfast.   . Respiratory Therapy Supplies (FLUTTER) DEVI Use as directed  . tapentadol (NUCYNTA ER) 50 MG 12 hr tablet Take 1 tablet (50 mg total) by mouth every 12 (twelve) hours.  . valsartan (DIOVAN) 320 MG tablet TAKE 1 TABLET(320 MG) BY MOUTH EVERY  MORNING  . verapamil (CALAN-SR) 240 MG CR tablet TAKE 1 TABLET BY MOUTH EVERY NIGHT AT BEDTIME  . Vitamin D, Ergocalciferol, (DRISDOL) 50000 units CAPS capsule TAKE 1 CAPSULE BY MOUTH EVERY 7 DAYS  . XIIDRA 5 % SOLN INT 1 GTT IN OU BID  . [DISCONTINUED] cloNIDine (CATAPRES) 0.1 MG tablet TAKE 1 TABLET BY MOUTH AS NEEDED. TAKE IF SYSTOLIC BLOOD PRESSURE ABOVE 160(UP TO 3 DOSES PER DAY    Allergies  Allergen Reactions  . Fentanyl Shortness Of Breath and Rash  . Amoxicillin Diarrhea    Severe diarrhea, rash to vaginal area with swelling   . Hctz [Hydrochlorothiazide] Other (See Comments)    HypoNatremia  . Hydralazine     Weak, sweats, red skin  . Codeine Other (See Comments)    makes her hyper  . Conjugated Estrogens Itching and Rash  . Erythromycin Rash    had a rash with emycin, has done ok with other meds in it's class  . Piroxicam Itching and Rash    Feldene   Social History   Tobacco Use  . Smoking status: Never Smoker  . Smokeless tobacco: Never Used  Substance Use Topics  . Alcohol use: Yes    Alcohol/week: 0.0 oz    Comment: occ   . Drug use: No    family history includes Arthritis in her mother; Breast cancer in her maternal grandmother, paternal aunt, and paternal grandmother; Diabetes in her paternal aunt; Heart disease in her maternal grandfather and maternal uncle; Leukemia in her father.  Wt Readings from Last 3 Encounters:  01/06/18 135 lb 9.6 oz (61.5 kg)  12/30/17 137 lb (62.1 kg)  11/27/17 141 lb (64 kg)    PHYSICAL EXAM BP (!) 141/72   Pulse 62   Ht 5' 4.5" (1.638 m)   Wt 135 lb 9.6 oz (61.5 kg)   BMI 22.92 kg/m  Physical Exam  Constitutional: She is oriented to person, place, and time. She appears well-developed and well-nourished. No distress.  HENT:  Head: Normocephalic and atraumatic.  Neck: No JVD present.  Cardiovascular: Normal rate, regular rhythm and intact distal pulses.  No extrasystoles are present. PMI is not displaced. Exam  reveals no gallop, no friction rub and no midsystolic click.  Murmur heard.  Low-pitched harsh  crescendo-decrescendo early systolic murmur is present with a grade of 1/6 at the upper right sternal border radiating to the neck. No obvious click  Pulmonary/Chest: Effort normal. No respiratory distress. She has no wheezes. She has no rales. She exhibits no tenderness.  Abdominal: Soft. Bowel sounds are normal. She exhibits no distension. There is no tenderness.  Musculoskeletal: Normal range of motion. She exhibits no edema.  Neurological: She is alert and oriented to person, place, and time.  Skin: Skin is warm and dry.  Psychiatric: She has a normal mood and affect. Judgment and thought content normal.  Remains anxious.  Less pressured speech  Nursing note and vitals reviewed.    Adult ECG Report  N/A  Other studies Reviewed: Additional studies/ records that were reviewed today include:  Recent Labs:  No recent labs available.    ASSESSMENT / PLAN:   Problem List Items Addressed This Visit    Essential hypertension - Primary (Chronic)    Very labile blood pressures.  She remains on high-dose Bystolic.  Was converted back to Diovan/valsartan from irbesartan.  Also remains on verapamil, but I really cannot increase that dose because of bradycardia. Racquel from [facility] recommended decrease alcohol intake and watching salt intake.  She also gave prescription for as needed clonidine for blood pressures greater than 160 mmHg.  I will simply make clonidine as standing dose at bedtime as well as a as the originally intended needed dose.  She will follow back up with [facility] for further titration of medications.  Next option would probably be spironolactone.      Relevant Medications   cloNIDine (CATAPRES) 0.1 MG tablet   Dyspnea on exertion (Chronic)    The only likelihood of there being a cardiac etiology associated with this would be that if she has hypertension, there is some diastolic  dysfunction related dyspnea.  Hopefully as we control her blood pressure this will improve.  There is no signs of severe diastolic dysfunction on echo however.      Aortic stenosis (Chronic)    I suspect that her valve is probably functionally bicuspid but not truly bicuspid.  Mild stenosis.  Can follow-up in 2020.  This could partly be related to her thoracic aortic aneurysm, but the progression has been pretty slow.      Relevant Medications   cloNIDine (CATAPRES) 0.1 MG tablet   Aneurysm of thoracic aorta (HCC) (Chronic)    Continue blood pressure control.  Imaging followed by CT surgery      Relevant Medications   cloNIDine (CATAPRES) 0.1 MG tablet     Although there is not many medical issues to discuss, her blood pressure has been very difficult to manage, and she has lots of questions.  I spent a total of 25-30 minutes with the patient.  Greater than 50% of time was spent in direct patient counseling.  Current medicines are reviewed at length with the patient today. (+/- concerns) n/a The following changes have been made: see below.  Patient Instructions  MEDICATION INSTRUCTIONS  CHANGE TO TAKING CLONIDINE AT BEDTIME , AND  IF BLOOD PRESSURE IS ABOVE 160    Your physician recommends that you schedule a follow-up appointment in 1 [place]   Your physician wants you to follow-up in [place]. You will receive a reminder letter in the mail two months in advance. If you don't receive a letter, please call our office to schedule the follow-up appointment.  If you need a refill on your cardiac medications before your next appointment, please call your pharmacy.    Studies Ordered:   No orders of the defined types were placed in this encounter.     [name], M.D., M.S. Interventional Cardiologist   Pager # [phone] Phone # [phone] [address] [place], [place] [address]

## 2018-01-06 NOTE — Patient Instructions (Addendum)
MEDICATION INSTRUCTIONS  CHANGE TO TAKING CLONIDINE AT BEDTIME , AND  IF BLOOD PRESSURE IS ABOVE 160    Your physician recommends that you schedule a follow-up appointment in 1 [place]   Your physician wants you to follow-up in [place]. You will receive a reminder letter in the mail two months in advance. If you don't receive a letter, please call our office to schedule the follow-up appointment.   If you need a refill on your cardiac medications before your next appointment, please call your pharmacy.

## 2018-01-08 ENCOUNTER — Encounter: Payer: Self-pay | Admitting: Cardiology

## 2018-01-08 ENCOUNTER — Encounter: Payer: Self-pay | Admitting: Internal Medicine

## 2018-01-08 NOTE — Assessment & Plan Note (Signed)
I suspect that her valve is probably functionally bicuspid but not truly bicuspid.  Mild stenosis.  Can follow-up in 2020.  This could partly be related to her thoracic aortic aneurysm, but the progression has been pretty slow.

## 2018-01-08 NOTE — Assessment & Plan Note (Addendum)
Very labile blood pressures.  She remains on high-dose Bystolic.  Was converted back to Diovan/valsartan from irbesartan.  Also remains on verapamil, but I really cannot increase that dose because of bradycardia. Racquel from [facility] recommended decrease alcohol intake and watching salt intake.  She also gave prescription for as needed clonidine for blood pressures greater than 160 mmHg.  I will simply make clonidine as standing dose at bedtime as well as a as the originally intended needed dose.  She will follow back up with [facility] for further titration of medications.  Next option would probably be spironolactone.

## 2018-01-08 NOTE — Assessment & Plan Note (Signed)
Continue blood pressure control.  Imaging followed by CT surgery

## 2018-01-08 NOTE — Assessment & Plan Note (Signed)
The only likelihood of there being a cardiac etiology associated with this would be that if she has hypertension, there is some diastolic dysfunction related dyspnea.  Hopefully as we control her blood pressure this will improve.  There is no signs of severe diastolic dysfunction on echo however.

## 2018-01-09 ENCOUNTER — Telehealth: Payer: Self-pay

## 2018-01-09 NOTE — Telephone Encounter (Signed)
PA started on CoverMyMeds KEY: [place]

## 2018-01-09 NOTE — Telephone Encounter (Signed)
PA started on CoverMyMeds KEY: [id number]

## 2018-01-12 NOTE — Telephone Encounter (Signed)
PA approved through till 01/10/2019

## 2018-01-18 ENCOUNTER — Other Ambulatory Visit: Payer: Self-pay | Admitting: Internal Medicine

## 2018-01-19 ENCOUNTER — Other Ambulatory Visit: Payer: Self-pay | Admitting: Cardiology

## 2018-01-19 ENCOUNTER — Encounter: Payer: Self-pay | Admitting: Internal Medicine

## 2018-01-19 ENCOUNTER — Other Ambulatory Visit: Payer: Self-pay | Admitting: Internal Medicine

## 2018-01-19 NOTE — Telephone Encounter (Signed)
Control database checked last refill:12/20/2017 LOV: 12/30/17

## 2018-01-20 ENCOUNTER — Other Ambulatory Visit: Payer: Self-pay | Admitting: Cardiology

## 2018-01-20 ENCOUNTER — Ambulatory Visit (INDEPENDENT_AMBULATORY_CARE_PROVIDER_SITE_OTHER): Payer: Medicare Other | Admitting: Pharmacist Clinician (PhC)/ Clinical Pharmacy Specialist

## 2018-01-20 ENCOUNTER — Encounter: Payer: Self-pay | Admitting: Pharmacist Clinician (PhC)/ Clinical Pharmacy Specialist

## 2018-01-20 VITALS — BP 176/86

## 2018-01-20 DIAGNOSIS — I10 Essential (primary) hypertension: Secondary | ICD-10-CM

## 2018-01-20 MED ORDER — SPIRONOLACTONE 25 MG PO TABS: 25.0000 mg | ORAL_TABLET | Freq: Every day | ORAL | 3 refills | Status: DC

## 2018-01-20 NOTE — Assessment & Plan Note (Signed)
Patient with essential hypertension, most likely tied to her chronic pain issues.  Her BP range in systolic readings goes from 119-210 and I would suspect the lower readings are tied to times of day when she is feeling better.  Will have her start spironolactone 25 mg once daily and repeat BMET after 1-2 weeks.  I did ask that she stop taking an OTC potassium supplement.  She will take her BP only twice daily, am and 5pm, and can take the clonidine if the 5 pm systolic pressure is > 836.  We will see her back in the office in one month for follow up.

## 2018-01-20 NOTE — Patient Instructions (Addendum)
   Your blood pressure today is 176/86  (first goal is < 150/90)   Check your blood pressure at home no more than twice daily and keep record of the readings.  Take your BP meds as follows:  Valsartan 368m daily every morning  Spironolactone 25 mg daily every morning  Nebivolol 239mdaily at noon  Verapamil SR 24043maily every evening  Clonidine 0.1 mg around 5 pm for systolic pressure > 170594ring all of your meds, your BP cuff and your record of home blood pressures to your next appointment.  Exercise as you're able, try to walk approximately 30 minutes per day.  Keep salt intake to a minimum, especially watch canned and prepared boxed foods.  Eat more fresh fruits and vegetables and fewer canned items.  Avoid eating in fast food restaurants.    HOW TO TAKE YOUR BLOOD PRESSURE: . Rest 5 minutes before taking your blood pressure. .  Don't smoke or drink caffeinated beverages for at least 30 minutes before. . Take your blood pressure before (not after) you eat. . Sit comfortably with your back supported and both feet on the floor (don't cross your legs). . Elevate your arm to heart level on a table or a desk. . Use the proper sized cuff. It should fit smoothly and snugly around your bare upper arm. There should be enough room to slip a fingertip under the cuff. The bottom edge of the cuff should be 1 inch above the crease of the elbow. . Ideally, take 3 measurements at one sitting and record the average.

## 2018-01-20 NOTE — Progress Notes (Signed)
Patient ID: Chisa Kushner                 DOB: [birth year]                      MRN: [id number]     HPI: Marissa [name] is a 72 y.o. female referred by Dr. Zadie Rhine to HTN clinic. PMH includes HTN, thoracis aneurysm,aortic stenosis, asthma, severe neurological urinary incontinence, heart valve regurgitation, hyperlipidemia, chronic pain, and migraine.  She was tried on hydralazine but developed an allergic reaction.  She has also been intolerant to thiazide diuretics because of urinary incontinence.  She is doing well with her current meds, but believes that the Bystolic makes her feel somewhat "out of sorts" and would ideally like to get off of it.    Chronic pain seems to be the biggest challenge for her, states her pain level is usually around a 6 mid-day, then increases to 8-9 by evenings.  She is working with her PCP to try various products, currently using Nucynta, but so far is not happy with this.  Today she states her pain level at an 8, in part because she had difficulty sleeping last night.    Labs:  11/27/17:  Na 135, K 4.2, Glu 94 SCr 0.65  Current HTN meds:  Valsartan 381m daily every morning Nebivolol 216mdaily at noon Verapamil SR 24083maily every evening Clonidine 0.1 mg hs and prn SBP > 170  Previously tried:  Valsartan 160m58mily - stopped due to recall  Intolerance: Diuretics (hydralazine and chlorthalidone) - low sodium, urinary incontinence Hydralazine - join pian, red skin, weakness  BP goal: 130/80 (140/90 due to hx of dizziness and fainting with lower BP)  Family History: no significant from mother and father, heart disease from uncle  Social History: scotch 2-3x day to help with her pain (one shot around 3-4 pm then every few hours until night); 1 cup coffee in am; no tobacco  Diet: decreased appetite d/t pain issues; eats mostly home cooked, no sweets or fats; only rare red meats  Exercise: Previously did yoga but with hip/back problems is not able to get  to floor any longer  Home BP readings:  13 morning readings; average 152/82 (54-69bpm) 15 afternoon/evening: average 167/87 (similar heart rates)   Wt Readings from Last 3 Encounters:  01/06/18 135 lb 9.6 oz (61.5 kg)  12/30/17 137 lb (62.1 kg)  11/27/17 141 lb (64 kg)   BP Readings from Last 3 Encounters:  01/20/18 (!) 176/86  01/06/18 (!) 141/72  12/30/17 130/80   Pulse Readings from Last 3 Encounters:  01/06/18 62  12/30/17 (!) 56  11/27/17 (!) 52    Past Medical History:  Diagnosis Date  . Arthritis   . Asthma   . Bicuspid aortic valve   . Bursitis   . Cataract    mild  . Dysthymia   . Heart valve regurgitation   . Hemorrhoids   . Hx of ulcerative colitis    per dr jenkArnoldo Moraleper pt.  . Hyperlipidemia   . Hypertension    managed - labile.  . Migraine   . Scoliosis   . 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 Spinal stenosis   . Thoracic aortic aneurysm (HCC)    Stable 4.2-4.3 cm (followed by  Dr. Roxan Hockey)  . Thyroid disease     Current Outpatient Medications on File Prior to Visit  Medication Sig Dispense Refill  . albuterol (PROAIR HFA) 108 (90 Base) MCG/ACT inhaler INHALE 2 PUFFS INTO THE LUNGS EVERY 6 (SIX) HOURS AS NEEDED. 8.5 each 2  . carisoprodol (SOMA) 350 MG tablet Take 1 tablet (350 mg total) by mouth 3 (three) times daily as needed. 60 tablet 5  . cloNIDine (CATAPRES) 0.1 MG tablet Take 1 tablet (0.1 mg total) by mouth at bedtime. May take an extra tablet if systolic blood pressure is greater than 160 120 tablet 3  . cloNIDine (CATAPRES) 0.1 MG tablet TAKE 1 TABLET BY MOUTH THREE TIMES DAILY AS NEEDED. TAKE ONLY AS NEEDED FOR SYSTOLIC BLOOD PRESSURE ABOVE 160 15 tablet 6  . cycloSPORINE (RESTASIS) 0.05 % ophthalmic emulsion Place 1 drop into both eyes 2 (two) times daily.    [name] Kitchen doxycycline (VIBRA-TABS) 100 MG tablet Take 1  tablet (100 mg total) by mouth daily. 30 tablet 0  . Eszopiclone 3 MG TABS TAKE 1 TABLET BY MOUTH EVERY NIGHT IMMEDIATELY BEFORE BEDTIME 30 tablet 2  . famotidine (PEPCID) 20 MG tablet Take 20 mg by mouth at bedtime.    [name] Kitchen FLUoxetine (PROZAC) 40 MG capsule Take 1 capsule (40 mg total) by mouth 2 (two) times daily. 60 capsule 5  . levothyroxine (SYNTHROID, LEVOTHROID) 125 MCG tablet TAKE 1 TABLET(125 MCG) BY MOUTH DAILY 90 tablet 1  . metroNIDAZOLE (METROGEL) 0.75 % gel Apply 1 application topically 2 (two) times daily. 45 g 0  . MINIVELLE 0.05 MG/24HR patch Place 1 patch onto the skin 2 (two) times a week.   3  . mirabegron ER (MYRBETRIQ) 50 MG TB24 tablet Take 1 tablet (50 mg total) by mouth daily. 90 tablet 1  . montelukast (SINGULAIR) 10 MG tablet Take 1 tablet (10 mg total) by mouth at bedtime. 90 tablet 1  . Nebivolol HCl 20 MG TABS Take 1 tablet (20 mg total) by mouth daily. 30 tablet 2  . omeprazole (PRILOSEC) 20 MG capsule Take 20 mg by mouth daily before breakfast.     . Respiratory Therapy Supplies (FLUTTER) DEVI Use as directed 1 each 0  . tapentadol (NUCYNTA ER) 50 MG 12 hr tablet Take 1 tablet (50 mg total) by mouth every 12 (twelve) hours. 60 tablet 0  . valsartan (DIOVAN) 320 MG tablet TAKE 1 TABLET(320 MG) BY MOUTH EVERY MORNING 90 tablet 1  . verapamil (CALAN-SR) 240 MG CR tablet TAKE 1 TABLET BY MOUTH EVERY NIGHT AT BEDTIME 90 tablet 0  . Vitamin D, Ergocalciferol, (DRISDOL) 50000 units CAPS capsule TAKE 1 CAPSULE BY MOUTH EVERY 7 DAYS 12 capsule 0  . XIIDRA 5 % SOLN INT 1 GTT IN OU BID  4   No current facility-administered medications on file prior to visit.     Allergies  Allergen Reactions  . Fentanyl Shortness Of Breath and Rash  . Amoxicillin Diarrhea    Severe diarrhea, rash to vaginal area with swelling   . Hctz [Hydrochlorothiazide] Other (See Comments)    HypoNatremia  . Hydralazine     Weak, sweats, red skin  . Codeine Other (See Comments)    makes her hyper   . Conjugated Estrogens Itching and Rash  . Erythromycin Rash    had a rash with emycin, has done ok with other meds in it's class  . Piroxicam Itching and Rash    Feldene    Blood pressure (!) 176/86.  Essential hypertension Patient with essential hypertension, most likely tied to her chronic pain issues.  Her BP range in systolic readings goes from 119-210 and I would suspect the lower readings are tied to times of day when she is feeling better.  Will have her start spironolactone 25 mg once daily and repeat BMET after 1-2 weeks.  I did ask that she stop taking an OTC potassium supplement.  She will take her BP only twice daily, am and 5pm, and can take the clonidine if the 5 pm systolic pressure is > 574.  We will see her back in the office in one month for follow up.    Tommy Medal PharmD CPP [facility] [address] [place] [address] 01/20/2018 2:04 PM

## 2018-02-17 ENCOUNTER — Encounter: Payer: Self-pay | Admitting: Internal Medicine

## 2018-02-18 ENCOUNTER — Telehealth: Payer: Self-pay

## 2018-02-18 NOTE — Telephone Encounter (Signed)
PA started on CoverMyMeds KEY: [id number]

## 2018-02-24 ENCOUNTER — Encounter: Payer: Self-pay | Admitting: Internal Medicine

## 2018-02-24 ENCOUNTER — Ambulatory Visit: Payer: Medicare Other

## 2018-02-24 ENCOUNTER — Other Ambulatory Visit: Payer: Self-pay | Admitting: Cardiology

## 2018-02-24 ENCOUNTER — Ambulatory Visit: Payer: Medicare Other | Admitting: Internal Medicine

## 2018-02-24 VITALS — BP 110/70 | HR 52 | Temp 97.9°F | Ht 64.5 in | Wt 138.0 lb

## 2018-02-24 DIAGNOSIS — Z23 Encounter for immunization: Secondary | ICD-10-CM | POA: Diagnosis not present

## 2018-02-24 DIAGNOSIS — M5416 Radiculopathy, lumbar region: Secondary | ICD-10-CM | POA: Diagnosis not present

## 2018-02-24 NOTE — Patient Instructions (Signed)
We have given you the first hepatitis shot today.

## 2018-02-24 NOTE — Assessment & Plan Note (Signed)
Taking nucynta daily as needed for pain. Can refill when needed and visit again in 3 months for surveillance.

## 2018-02-24 NOTE — Progress Notes (Signed)
   Subjective:    Patient ID: Marissa [name], female    DOB: 07/28/1946, 72 y.o.   MRN: [id number]  HPI The patient is a 72 YO female coming in for follow up of change to nucynta from demerol. She did have to change given that they stopped making demerol. She is taking only in the evening due to it making her some sleepy. She is having mild headache from it. She is also having a lot of stress in the last 2 months with family changes. This has caused her to have some fatigue in the last 2 months. She is starting to have some improvement. She is not sleeping as well.   Review of Systems  Constitutional: Positive for activity change and fatigue. Negative for appetite change and chills.  HENT: Negative.   Eyes: Negative.   Respiratory: Negative for cough, chest tightness and shortness of breath.   Cardiovascular: Negative for chest pain, palpitations and leg swelling.  Gastrointestinal: Negative for abdominal distention, abdominal pain, constipation, diarrhea, nausea and vomiting.  Musculoskeletal: Positive for back pain.  Skin: Negative.   Neurological: Negative.   Psychiatric/Behavioral: Positive for sleep disturbance.      Objective:   Physical Exam  Constitutional: She is oriented to person, place, and time. She appears well-developed and well-nourished.  HENT:  Head: Normocephalic and atraumatic.  Eyes: EOM are normal.  Neck: Normal range of motion.  Cardiovascular: Normal rate and regular rhythm.  Pulmonary/Chest: Effort normal and breath sounds normal. No respiratory distress. She has no wheezes. She has no rales.  Abdominal: Soft. Bowel sounds are normal. She exhibits no distension. There is no tenderness. There is no rebound.  Musculoskeletal: She exhibits no edema.  Neurological: She is alert and oriented to person, place, and time. Coordination normal.  Skin: Skin is warm and dry.   Vitals:   02/24/18 1052  BP: 110/70  Pulse: (!) 52  Temp: 97.9 F (36.6 C)  TempSrc:  Oral  SpO2: 99%  Weight: 138 lb (62.6 kg)  Height: 5' 4.5" (1.638 m)      Assessment & Plan:  Hep A/B given at visit

## 2018-02-24 NOTE — Telephone Encounter (Signed)
Rx sent to pharmacy   

## 2018-03-02 ENCOUNTER — Ambulatory Visit: Payer: Medicare Other | Admitting: Pharmacist Clinician (PhC)/ Clinical Pharmacy Specialist

## 2018-03-02 DIAGNOSIS — I10 Essential (primary) hypertension: Secondary | ICD-10-CM | POA: Diagnosis not present

## 2018-03-02 NOTE — Patient Instructions (Addendum)
  Your blood pressure today is 150/82  Check your blood pressure at home 3-4 times per week and keep record of the readings.  Take your BP meds as follows:  Valsartan 365m daily every morning  Nebivolol 178mdaily at noon  Verapamil SR 24068maily every evening  Spironolactone 25 mg daily every morning  Clonidine 0.1 mg hs and as needed for SBP > 170   Bring all of your meds, your BP cuff and your record of home blood pressures to your next appointment.  Exercise as you're able, try to walk approximately 30 minutes per day.  Keep salt intake to a minimum, especially watch canned and prepared boxed foods.  Eat more fresh fruits and vegetables and fewer canned items.  Avoid eating in fast food restaurants.    HOW TO TAKE YOUR BLOOD PRESSURE: . Rest 5 minutes before taking your blood pressure. .  Don't smoke or drink caffeinated beverages for at least 30 minutes before. . Take your blood pressure before (not after) you eat. . Sit comfortably with your back supported and both feet on the floor (don't cross your legs). . Elevate your arm to heart level on a table or a desk. . Use the proper sized cuff. It should fit smoothly and snugly around your bare upper arm. There should be enough room to slip a fingertip under the cuff. The bottom edge of the cuff should be 1 inch above the crease of the elbow. . Ideally, take 3 measurements at one sitting and record the average.

## 2018-03-02 NOTE — Progress Notes (Signed)
Patient ID: Marissa [name]                 DOB: August 19, 1946                      MRN: [id number]     HPI: Marissa [name] is a 72 y.o. female referred by Dr. Zadie [name] to HTN clinic. PMH includes HTN, thoracis aneurysm,aortic stenosis, asthma, severe neurological urinary incontinence, heart valve regurgitation, hyperlipidemia, chronic pain, and migraine.  She was tried on hydralazine but developed an allergic reaction.  She has also been intolerant to thiazide diuretics because of urinary incontinence.    Chronic pain seems to be the biggest challenge for her, states her pain level is usually around a 6 mid-day, then increases to 8-9 by evenings.  She is working with her PCP to try various products, currently using Nucynta, but so far is not happy with this. She has recently done well using the Nucynta just at bedtime, with heat and ice packs depending on how she feels.    At last visit started spironolactone 25 mg daily and is doing quite well with it.  She has not yet had BMET drawn, will do so today.  She has been recently self-titrating her Bystolic dose and currently feels she is doing best at 10 mg daily.  She feels more than that causes her to be lethargic.  She has only taken a couple of doses of clonidine in the past month.    Today she notes that there has been much family stress in the past month.  Her husband's daughter just recently lost a Down's Syndrome baby and there was a lot of interaction with the ex-wife.  She feels that that is just now settling out.   Labs:  11/27/17:  Na 135, K 4.2, Glu 94 SCr 0.65  Current HTN meds:  Valsartan 31m daily every morning Nebivolol 19mdaily at noon Verapamil SR 24061maily every evening Spironolactone 25 mg qam Clonidine 0.1 mg hs and prn SBP > 170  Previously tried:  Valsartan 160m88mily - stopped due to recall  Intolerance: Diuretics (hydralazine and chlorthalidone) - low sodium, urinary incontinence Hydralazine - join pain, red skin,  weakness  BP goal: 140/90 due to hx of dizziness and fainting with lower BP  Family History: no significant from mother and father, heart disease from uncle  Social History: scotch 2-3x day to help with her pain (one shot around 3-4 pm then every few hours until night); 1 cup coffee in am; no tobacco  Diet: decreased appetite d/t pain issues; eats mostly home cooked, no sweets or fats; only rare red meats  Exercise: Previously did yoga but with hip/back problems is not able to get to floor any longer  Home BP readings: much improved with addition of spironolactone.  For last 3 weeks her average BP was 138/80 and she took clonidine only twice.  HR stable, mostly in 50-60's.       Wt Readings from Last 3 Encounters:  02/24/18 138 lb (62.6 kg)  01/06/18 135 lb 9.6 oz (61.5 kg)  12/30/17 137 lb (62.1 kg)   BP Readings from Last 3 Encounters:  03/02/18 (!) 150/82  02/24/18 110/70  01/20/18 (!) 176/86   Pulse Readings from Last 3 Encounters:  03/02/18 (!) 56  02/24/18 (!) 52  01/06/18 62    Past Medical History:  Diagnosis Date  . Arthritis   . Asthma   .  Bicuspid aortic valve   . Bursitis   . Cataract    mild  . Dysthymia   . Heart valve regurgitation   . Hemorrhoids   . Hx of ulcerative colitis    per dr Arnoldo Morale as per pt.  . Hyperlipidemia   . Hypertension    managed - labile.  . Migraine   . Scoliosis   . 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 Spinal stenosis   . Thoracic aortic aneurysm (HCC)    Stable 4.2-4.3 cm (followed by Dr. Roxan Hockey)  . Thyroid disease     Current Outpatient Medications on File Prior to Visit  Medication Sig Dispense Refill  . albuterol (PROAIR HFA) 108 (90 Base) MCG/ACT inhaler INHALE 2 PUFFS INTO THE LUNGS EVERY 6 (SIX) HOURS AS NEEDED. 8.5 each 2  . carisoprodol (SOMA) 350 MG tablet Take 1 tablet (350 mg total)  by mouth 3 (three) times daily as needed. 60 tablet 5  . cloNIDine (CATAPRES) 0.1 MG tablet TAKE 1 TABLET BY MOUTH THREE TIMES DAILY AS NEEDED. TAKE ONLY AS NEEDED FOR SYSTOLIC BLOOD PRESSURE ABOVE 160 15 tablet 6  . cycloSPORINE (RESTASIS) 0.05 % ophthalmic emulsion Place 1 drop into both eyes 2 (two) times daily.    . Eszopiclone 3 MG TABS TAKE 1 TABLET BY MOUTH EVERY NIGHT IMMEDIATELY BEFORE BEDTIME 30 tablet 2  . famotidine (PEPCID) 20 MG tablet Take 20 mg by mouth at bedtime.    [name] Kitchen FLUoxetine (PROZAC) 40 MG capsule Take 1 capsule (40 mg total) by mouth 2 (two) times daily. 60 capsule 5  . levothyroxine (SYNTHROID, LEVOTHROID) 125 MCG tablet TAKE 1 TABLET(125 MCG) BY MOUTH DAILY 90 tablet 1  . metroNIDAZOLE (METROGEL) 0.75 % gel Apply 1 application topically 2 (two) times daily. 45 g 0  . MINIVELLE 0.05 MG/24HR patch Place 1 patch onto the skin 2 (two) times a week.   3  . mirabegron ER (MYRBETRIQ) 50 MG TB24 tablet Take 1 tablet (50 mg total) by mouth daily. 90 tablet 1  . montelukast (SINGULAIR) 10 MG tablet Take 1 tablet (10 mg total) by mouth at bedtime. 90 tablet 1  . Nebivolol HCl 20 MG TABS Take 1 tablet (20 mg total) by mouth daily. 30 tablet 2  . omeprazole (PRILOSEC) 20 MG capsule Take 20 mg by mouth as needed.    [name] Kitchen Respiratory Therapy Supplies (FLUTTER) DEVI Use as directed 1 each 0  . spironolactone (ALDACTONE) 25 MG tablet Take 1 tablet (25 mg total) by mouth daily. 30 tablet 3  . tapentadol (NUCYNTA ER) 50 MG 12 hr tablet Take 1 tablet (50 mg total) by mouth every 12 (twelve) hours. 60 tablet 0  . valsartan (DIOVAN) 320 MG tablet TAKE 1 TABLET(320 MG) BY MOUTH EVERY MORNING 30 tablet 2  . verapamil (CALAN-SR) 240 MG CR tablet TAKE 1 TABLET BY MOUTH EVERY NIGHT AT BEDTIME 90 tablet 0  . Vitamin D, Ergocalciferol, (DRISDOL) 50000 units CAPS capsule TAKE 1 CAPSULE BY MOUTH EVERY 7 DAYS 12 capsule 0   No current facility-administered medications on file prior to visit.      Allergies  Allergen Reactions  . Fentanyl Shortness Of Breath and Rash  . Amoxicillin Diarrhea    Severe diarrhea, rash to vaginal area with swelling   . Hctz [Hydrochlorothiazide] Other (See Comments)    HypoNatremia  .  Hydralazine     Weak, sweats, red skin  . Codeine Other (See Comments)    makes her hyper  . Conjugated Estrogens Itching and Rash  . Erythromycin Rash    had a rash with emycin, has done ok with other meds in it's class  . Piroxicam Itching and Rash    Feldene    Blood pressure (!) 150/82, pulse (!) 56.  Essential hypertension Patient doing much better with BP control with addition of spironolactone.  She cut the Bystolic dose back to 10 mg.  She is going to get a BMET today and will continue with her current regimen.  She knows to call our office if she finds that she is using the clonidine more than 2-3 times per week or has other BP concerns.    Tommy Medal PharmD CPP [facility] [address] [place] [address] 03/03/2018 9:53 AM

## 2018-03-03 ENCOUNTER — Encounter: Payer: Self-pay | Admitting: Pharmacist Clinician (PhC)/ Clinical Pharmacy Specialist

## 2018-03-03 LAB — BASIC METABOLIC PANEL
BUN/Creatinine Ratio: 21 (ref 12–28)
BUN: 13 mg/dL (ref 8–27)
CALCIUM: 9.7 mg/dL (ref 8.7–10.3)
CO2: 24 mmol/L (ref 20–29)
CREATININE: 0.63 mg/dL (ref 0.57–1.00)
Chloride: 95 mmol/L — ABNORMAL LOW (ref 96–106)
GFR calc Af Amer: 103 mL/min/{1.73_m2} (ref >59)
GFR, EST NON AFRICAN AMERICAN: 89 mL/min/{1.73_m2} (ref >59)
Glucose: 87 mg/dL (ref 65–99)
Potassium: 4.9 mmol/L (ref 3.5–5.2)
Sodium: 135 mmol/L (ref 134–144)

## 2018-03-03 NOTE — Assessment & Plan Note (Signed)
Patient doing much better with BP control with addition of spironolactone.  She cut the Bystolic dose back to 10 mg.  She is going to get a BMET today and will continue with her current regimen.  She knows to call our office if she finds that she is using the clonidine more than 2-3 times per week or has other BP concerns.

## 2018-03-04 ENCOUNTER — Encounter: Payer: Self-pay | Admitting: Internal Medicine

## 2018-03-04 MED ORDER — TAPENTADOL HCL ER 50 MG PO TB12: 50.0000 mg | ORAL_TABLET | Freq: Two times a day (BID) | ORAL | 0 refills | Status: DC

## 2018-03-19 DIAGNOSIS — H25013 Cortical age-related cataract, bilateral: Secondary | ICD-10-CM | POA: Diagnosis not present

## 2018-03-19 DIAGNOSIS — H04123 Dry eye syndrome of bilateral lacrimal glands: Secondary | ICD-10-CM | POA: Diagnosis not present

## 2018-03-19 DIAGNOSIS — H2513 Age-related nuclear cataract, bilateral: Secondary | ICD-10-CM | POA: Diagnosis not present

## 2018-03-24 ENCOUNTER — Encounter: Payer: Self-pay | Admitting: Internal Medicine

## 2018-03-26 ENCOUNTER — Ambulatory Visit (INDEPENDENT_AMBULATORY_CARE_PROVIDER_SITE_OTHER): Payer: Medicare Other | Admitting: *Deleted

## 2018-03-26 DIAGNOSIS — Z23 Encounter for immunization: Secondary | ICD-10-CM

## 2018-04-07 DIAGNOSIS — H04123 Dry eye syndrome of bilateral lacrimal glands: Secondary | ICD-10-CM | POA: Diagnosis not present

## 2018-04-07 DIAGNOSIS — H25013 Cortical age-related cataract, bilateral: Secondary | ICD-10-CM | POA: Diagnosis not present

## 2018-04-07 DIAGNOSIS — H40013 Open angle with borderline findings, low risk, bilateral: Secondary | ICD-10-CM | POA: Diagnosis not present

## 2018-04-07 DIAGNOSIS — H2513 Age-related nuclear cataract, bilateral: Secondary | ICD-10-CM | POA: Diagnosis not present

## 2018-04-12 ENCOUNTER — Encounter: Payer: Self-pay | Admitting: Internal Medicine

## 2018-04-12 ENCOUNTER — Other Ambulatory Visit: Payer: Self-pay | Admitting: Internal Medicine

## 2018-04-13 NOTE — Telephone Encounter (Signed)
Control database checked last refill:03/19/2018

## 2018-04-17 ENCOUNTER — Other Ambulatory Visit: Payer: Self-pay | Admitting: Thoracic Surgery (Cardiothoracic Vascular Surgery)

## 2018-04-17 DIAGNOSIS — I712 Thoracic aortic aneurysm, without rupture, unspecified: Secondary | ICD-10-CM

## 2018-04-21 ENCOUNTER — Telehealth: Payer: Self-pay | Admitting: *Deleted

## 2018-04-21 ENCOUNTER — Encounter: Payer: Self-pay | Admitting: Cardiology

## 2018-04-21 NOTE — Progress Notes (Signed)
Received letter from Dr. Aldona Lento, DDS asking about need for antibiotic prophylaxis for Marissa [name].  Apparently she has been taking antibiotic prophylaxis for years because of a prior diagnosis of mitral prolapse and bicuspid aortic valve.  She indicated a desire to continue taking them, but Dr. Gordy Levan is asking my recommendations based on recent guidelines showing that this would not be indicated.  I am sending him a letter indicating that truthfully she does not have mitral valve prolapse and has no regurgitation.  She also has bicuspid valve but with no regurgitation or prior repair.  As such she would not require antibiotic prophylaxis based on new guidelines.  [name], MD

## 2018-04-21 NOTE — Telephone Encounter (Signed)
FAXED LETTER DR [place] DICTATED IN REGARDS TO PATIENT NOT NEEDING SBE- PRE MEDICATION FOR DENTAL PROCEDURES.

## 2018-04-27 ENCOUNTER — Ambulatory Visit: Payer: Medicare Other

## 2018-05-04 ENCOUNTER — Encounter: Payer: Self-pay | Admitting: Internal Medicine

## 2018-05-05 ENCOUNTER — Other Ambulatory Visit: Payer: Self-pay | Admitting: Cardiology

## 2018-05-06 DIAGNOSIS — H25811 Combined forms of age-related cataract, right eye: Secondary | ICD-10-CM | POA: Diagnosis not present

## 2018-05-06 DIAGNOSIS — H2511 Age-related nuclear cataract, right eye: Secondary | ICD-10-CM | POA: Diagnosis not present

## 2018-05-11 ENCOUNTER — Other Ambulatory Visit: Payer: Self-pay | Admitting: Cardiology

## 2018-05-11 ENCOUNTER — Other Ambulatory Visit: Payer: Self-pay | Admitting: Internal Medicine

## 2018-05-12 ENCOUNTER — Ambulatory Visit: Payer: Medicare Other | Admitting: Thoracic Surgery (Cardiothoracic Vascular Surgery)

## 2018-05-12 DIAGNOSIS — H2512 Age-related nuclear cataract, left eye: Secondary | ICD-10-CM | POA: Diagnosis not present

## 2018-05-12 DIAGNOSIS — H25012 Cortical age-related cataract, left eye: Secondary | ICD-10-CM | POA: Diagnosis not present

## 2018-05-20 DIAGNOSIS — H2512 Age-related nuclear cataract, left eye: Secondary | ICD-10-CM | POA: Diagnosis not present

## 2018-05-20 DIAGNOSIS — H25812 Combined forms of age-related cataract, left eye: Secondary | ICD-10-CM | POA: Diagnosis not present

## 2018-06-04 DIAGNOSIS — Z01419 Encounter for gynecological examination (general) (routine) without abnormal findings: Secondary | ICD-10-CM | POA: Diagnosis not present

## 2018-06-04 DIAGNOSIS — N958 Other specified menopausal and perimenopausal disorders: Secondary | ICD-10-CM | POA: Diagnosis not present

## 2018-06-04 DIAGNOSIS — Z6824 Body mass index (BMI) 24.0-24.9, adult: Secondary | ICD-10-CM | POA: Diagnosis not present

## 2018-06-04 DIAGNOSIS — Z1231 Encounter for screening mammogram for malignant neoplasm of breast: Secondary | ICD-10-CM | POA: Diagnosis not present

## 2018-06-05 ENCOUNTER — Ambulatory Visit: Payer: Medicare Other | Admitting: Internal Medicine

## 2018-06-05 ENCOUNTER — Encounter: Payer: Self-pay | Admitting: Internal Medicine

## 2018-06-05 VITALS — BP 134/80 | HR 62 | Temp 98.2°F | Ht 64.5 in | Wt 143.0 lb

## 2018-06-05 DIAGNOSIS — M5416 Radiculopathy, lumbar region: Secondary | ICD-10-CM

## 2018-06-05 DIAGNOSIS — M797 Fibromyalgia: Secondary | ICD-10-CM | POA: Diagnosis not present

## 2018-06-05 MED ORDER — KETOROLAC TROMETHAMINE 30 MG/ML IJ SOLN
30.0000 mg | Freq: Once | INTRAMUSCULAR | Status: AC
  Administered 2018-06-05: 30 mg via INTRAMUSCULAR

## 2018-06-05 MED ORDER — LIDOCAINE 5 % EX PTCH: 1.0000 | MEDICATED_PATCH | CUTANEOUS | 0 refills | Status: DC

## 2018-06-05 NOTE — Assessment & Plan Note (Signed)
Toradol IM given at visit 30 mg. Rx for lidoderm patch. If helpful toradol can try to get intranasal toradol approved.

## 2018-06-05 NOTE — Assessment & Plan Note (Signed)
Rx for lidocaine patches. Can keep soma as needed. Stop nucynta as it was not helpful. Toradol IM given at visit and if helpful can consider intranasal toradol.

## 2018-06-05 NOTE — Patient Instructions (Signed)
We have given you a toradol shot today to see if this helps.   We have also sent in lidocaine patches to see if they help.

## 2018-06-05 NOTE — Progress Notes (Signed)
   Subjective:    Patient ID: Marissa [name], female    DOB: [birth year], 72 y.o.   MRN: [id number]  HPI The patient is a 72 YO female coming in for chronic neck and back pain. She previously was taking demerol daily for this but they stopped making it. We tried her on nucynta which she was taking sometimes. She is not happy with how this is working. She also has soma which she can take as needed. She has also tried otc things to help with the pain without much relief. Over the last 20 years she has tried many things including all the antidepressants, gabapentin, lyrica, elavil, muscle relaxers, opioids (many poor reactions). She has tried otc lidocaine patches with mild relief and no side effects.   Review of Systems  Constitutional: Positive for activity change. Negative for appetite change, chills, fatigue, fever and unexpected weight change.  Respiratory: Negative.   Cardiovascular: Negative.   Gastrointestinal: Negative.   Musculoskeletal: Positive for arthralgias, back pain, myalgias and neck pain. Negative for gait problem and joint swelling.  Skin: Negative.   Neurological: Negative.       Objective:   Physical Exam  Constitutional: She is oriented to person, place, and time. She appears well-developed and well-nourished.  HENT:  Head: Normocephalic and atraumatic.  Eyes: EOM are normal.  Neck: Normal range of motion.  Cardiovascular: Normal rate and regular rhythm.  Pulmonary/Chest: Effort normal and breath sounds normal. No respiratory distress. She has no wheezes. She has no rales.  Abdominal: Soft.  Musculoskeletal: She exhibits tenderness. She exhibits no edema.  Neurological: She is alert and oriented to person, place, and time. Coordination normal.  Skin: Skin is warm and dry.   Vitals:   06/05/18 1524  BP: 134/80  Pulse: 62  Temp: 98.2 F (36.8 C)  TempSrc: Oral  SpO2: 99%  Weight: 143 lb (64.9 kg)  Height: 5' 4.5" (1.638 m)      Assessment & Plan:  Toradol  30 mg IM given at visit

## 2018-06-09 ENCOUNTER — Ambulatory Visit: Payer: Medicare Other | Admitting: Thoracic Surgery (Cardiothoracic Vascular Surgery)

## 2018-06-09 ENCOUNTER — Other Ambulatory Visit: Payer: Medicare Other

## 2018-06-10 ENCOUNTER — Telehealth: Payer: Self-pay

## 2018-06-10 ENCOUNTER — Other Ambulatory Visit: Payer: Self-pay | Admitting: Cardiology

## 2018-06-10 NOTE — Telephone Encounter (Signed)
PA started on CoverMyMeds KEY: [id number]

## 2018-06-11 NOTE — Telephone Encounter (Signed)
[employer] called to give reason for denial:  There was no FDA labeled or medically accepted use for the patch  Contact [phone] option 5

## 2018-06-11 NOTE — Telephone Encounter (Signed)
Noted  

## 2018-06-25 ENCOUNTER — Ambulatory Visit (INDEPENDENT_AMBULATORY_CARE_PROVIDER_SITE_OTHER): Payer: Medicare Other

## 2018-06-25 DIAGNOSIS — Z23 Encounter for immunization: Secondary | ICD-10-CM

## 2018-06-25 NOTE — Progress Notes (Signed)
Hep A&B recombinant given in office today.

## 2018-07-03 ENCOUNTER — Encounter: Payer: Self-pay | Admitting: Internal Medicine

## 2018-07-04 ENCOUNTER — Other Ambulatory Visit: Payer: Self-pay | Admitting: Internal Medicine

## 2018-07-06 ENCOUNTER — Encounter: Payer: Self-pay | Admitting: Internal Medicine

## 2018-07-06 DIAGNOSIS — M542 Cervicalgia: Secondary | ICD-10-CM

## 2018-07-07 ENCOUNTER — Ambulatory Visit
Admission: RE | Admit: 2018-07-07 | Discharge: 2018-07-07 | Disposition: A | Discharge status: Home / Self Care | Payer: Medicare Other | Source: Ambulatory Visit | Attending: Thoracic Surgery (Cardiothoracic Vascular Surgery) | Admitting: Thoracic Surgery (Cardiothoracic Vascular Surgery)

## 2018-07-07 ENCOUNTER — Encounter: Payer: Self-pay | Admitting: Thoracic Surgery (Cardiothoracic Vascular Surgery)

## 2018-07-07 ENCOUNTER — Ambulatory Visit: Payer: Medicare Other | Admitting: Thoracic Surgery (Cardiothoracic Vascular Surgery)

## 2018-07-07 VITALS — BP 120/58 | HR 68 | Resp 20 | Ht 64.5 in | Wt 141.0 lb

## 2018-07-07 DIAGNOSIS — I712 Thoracic aortic aneurysm, without rupture, unspecified: Secondary | ICD-10-CM

## 2018-07-07 DIAGNOSIS — I7121 Aneurysm of the ascending aorta, without rupture: Secondary | ICD-10-CM

## 2018-07-07 MED ORDER — GADOBENATE DIMEGLUMINE 529 MG/ML IV SOLN
12.0000 mL | Freq: Once | INTRAVENOUS | Status: AC | PRN
Start: 2018-07-07 — End: 2018-07-07
  Administered 2018-07-07: 12 mL via INTRAVENOUS

## 2018-07-07 NOTE — Progress Notes (Signed)
Marissa 411       Marissa [name],Marissa [name] [address]             [phone]     HPI: 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  I last saw her in July 2018.  At that time her aneurysm was stable at 4.3 cm.  Was having some chest pain and some issues with blood pressure management.  I referred her to cardiology.  She has been seeing Dr. Ellyn [name] since then in addition to her primary Dr. Sharlet [name].  She says that her blood pressures been better controlled but still spikes occasionally.  Often associated with pain.  She is very discouraged about her pain management.  She is hoping that referral to a spinal specialist will be helpful.  Past Medical History:  Diagnosis Date  . Arthritis   . Asthma   . Bicuspid aortic valve   . Bursitis   . Cataract    mild  . Dysthymia   . Heart valve regurgitation   . Hemorrhoids   . Hx of ulcerative colitis    per dr Marissa [name] as per pt.  . Hyperlipidemia   . Hypertension    managed - labile.  . Migraine   . Scoliosis   . 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 Spinal stenosis   . Thoracic aortic aneurysm (HCC)    Stable 4.2-4.3 cm (followed by Dr. Roxan [name])  . Thyroid disease     Current Outpatient Medications  Medication Sig Dispense Refill  . albuterol (PROAIR HFA) 108 (90 Base) MCG/ACT inhaler INHALE 2 PUFFS INTO THE LUNGS EVERY 6 (SIX) HOURS AS NEEDED. 8.5 each 2  . carisoprodol (SOMA) 350 MG tablet Take 1 tablet (350 mg total) by mouth 3 (three) times daily as needed. 60 tablet 5  . cloNIDine (CATAPRES) 0.1 MG tablet  TAKE 1 TABLET BY MOUTH THREE TIMES DAILY AS NEEDED. TAKE ONLY AS NEEDED FOR SYSTOLIC BLOOD PRESSURE ABOVE 160 15 tablet 6  . cycloSPORINE (RESTASIS) 0.05 % ophthalmic emulsion Place 1 drop into both eyes 2 (two) times daily.    . Eszopiclone 3 MG TABS TAKE 1 TABLET BY MOUTH EVERY NIGHT IMMEADIATELY BEFORE BEDTIME 30 tablet 5  . famotidine (PEPCID) 20 MG tablet Take 20 mg by mouth at bedtime.    [name] Kitchen FLUoxetine (PROZAC) 40 MG capsule Take 1 capsule (40 mg total) by mouth 2 (two) times daily. 60 capsule 5  . levothyroxine (SYNTHROID, LEVOTHROID) 125 MCG tablet TAKE 1 TABLET(125 MCG) BY MOUTH DAILY 90 tablet 1  . lidocaine (LIDODERM) 5 % Place 1 patch onto the skin daily. Remove & Discard patch within 12 hours or as directed by MD 30 patch 0  . MINIVELLE 0.05 MG/24HR patch Place 1 patch onto the skin 2 (two) times a week.   3  . montelukast (SINGULAIR) 10 MG tablet Take 1 tablet (10 mg total) by mouth at bedtime. 90 tablet 1  . MYRBETRIQ 50 MG TB24 tablet TAKE 1 TABLET(50 MG) BY MOUTH DAILY 90 tablet 0  . Nebivolol HCl 20 MG TABS Take 1 tablet (  20 mg total) by mouth daily. 30 tablet 2  . omeprazole (PRILOSEC) 20 MG capsule Take 20 mg by mouth as needed.    [name] Kitchen Respiratory Therapy Supplies (FLUTTER) DEVI Use as directed 1 each 0  . spironolactone (ALDACTONE) 25 MG tablet TAKE 1 TABLET(25 MG) BY MOUTH DAILY 30 tablet 3  . valsartan (DIOVAN) 320 MG tablet TAKE 1 TABLET(320 MG) BY MOUTH EVERY MORNING 30 tablet 3  . verapamil (CALAN-SR) 240 MG CR tablet TAKE 1 TABLET BY MOUTH EVERY NIGHT AT BEDTIME 90 tablet 0  . verapamil (CALAN-SR) 240 MG CR tablet TAKE 1 TABLET(240 MG) BY MOUTH AT BEDTIME 90 tablet 1  . Vitamin D, Ergocalciferol, (DRISDOL) 50000 units CAPS capsule TAKE 1 CAPSULE BY MOUTH EVERY 7 DAYS 12 capsule 0   No current facility-administered medications for this visit.     Physical Exam BP (!) 120/58   Pulse 68   Resp 20   Ht 5' 4.5" (1.638 m)   Wt 141 lb (64 kg)   SpO2 99% Comment: RA  BMI  23.20 kg/m  72 year old woman in no acute distress Alert and oriented x3 with no focal deficits Lungs clear with equal breath sounds bilaterally Cardiac regular rate and rhythm with a 2/6 systolic murmur Pulses intact  Diagnostic Tests: MRA CHEST WITH OR WITHOUT CONTRAST  TECHNIQUE: Angiographic images of the chest were obtained using MRA technique with intravenous contrast.  CONTRAST:  51m MULTIHANCE GADOBENATE DIMEGLUMINE 529 MG/ML IV SOLN  Creatinine was obtained on site at [name] [address]  Results: Creatinine 0.9 mg/dL.  Estimated GFR 63 mL/minute  COMPARISON:  03/25/2017  FINDINGS: VASCULAR  Aorta: The ascending thoracic aorta shows stable mild aneurysmal disease with maximal diameter of 4.3 cm. The aortic root is normal in caliber and measures approximately 3.3 cm at the sinuses of Valsalva. The proximal arch measures 3.2 cm and the distal arch 2.8 cm. The descending thoracic aorta measures 2.4 cm. Proximal great vessels show normal patency and branching anatomy.  Heart: The heart size is normal. No pericardial fluid identified.  Pulmonary Arteries: Central pulmonary arteries are normal in caliber.  NON-VASCULAR  No masses or lymphadenopathy identified. No pleural fluid seen. No visualized pulmonary or bony abnormalities.  IMPRESSION: Stable mild aneurysmal disease of the ascending thoracic aorta which measures 4.3 cm in estimated maximal diameter.   Electronically Signed   By: Marissa EdouardM.D.   On: 07/07/2018 12:53 I personally reviewed the MRI images and concur with the findings noted above.  Impression: Ms. Marissa [name] a 72year old woman with past history significant for hypertension, hyperlipidemia, mitral valve prolapse, and ascending aneurysm, bicuspid aortic valve, and chronic pain.  Ascending aneurysm-stable at 4.3 cm.  Needs continued annual follow-up.  Bicuspid aortic valve with mild  stenosis-being followed by echo by Dr. HEllyn [name] Hypertension-blood pressure better controlled on current regimen with addition of Bystolic  Plan: Return in 1 year with MR Angie of chest  Marissa Nakayama MD [facility] [phone]

## 2018-07-20 ENCOUNTER — Other Ambulatory Visit: Payer: Self-pay | Admitting: Internal Medicine

## 2018-07-23 NOTE — Progress Notes (Addendum)
Subjective:   Marissa [name] is a 72 y.o. female who presents for Medicare Annual (Subsequent) preventive examination.  Review of Systems:  No ROS.  Medicare Wellness Visit. Additional risk factors are reflected in the social history.  Cardiac Risk Factors include: dyslipidemia;advanced age (>77mn, >>107women);hypertension Sleep patterns: has interrupted sleep, gets up 1-2 times nightly to void and sleeps 5-6 hours nightly. Patient reports insomnia issues, discussed recommended sleep tips and stress reduction tips.   Home Safety/Smoke Alarms: Feels safe in home. Smoke alarms in place.  Living environment; residence and Firearm Safety: 2-story house, no firearms. Lives with husband, no needs for DME, good support system Seat Belt Safety/Bike Helmet: Wears seat belt.     Objective:     Vitals: BP 138/82   Pulse 61   Resp 16   Ht 5' 4"  (1.626 m)   Wt 141 lb (64 kg)   SpO2 100%   BMI 24.20 kg/m   Body mass index is 24.2 kg/m.  Advanced Directives 07/24/2018 07/14/2017 01/31/2016 06/20/2015 02/28/2015 02/13/2015  Does Patient Have a Medical Advance Directive? Yes Yes Yes Yes Yes Yes  Type of AParamedicof [place] will [place] will [place]Living will - - [place]Living will  Copy of HRancho Banquetein Chart? No - copy requested No - copy requested - Yes - -    Tobacco Social History   Tobacco Use  Smoking Status Never Smoker  Smokeless Tobacco Never Used     Counseling given: Not Answered  Past Medical History:  Diagnosis Date  . Arthritis   . Asthma   . Bicuspid aortic valve   . Bursitis   . Cataract    mild  . Dysthymia   . Heart valve regurgitation   . Hemorrhoids   . Hx of ulcerative colitis    per dr jArnoldo Moraleas per pt.  . Hyperlipidemia   . Hypertension    managed - labile.  . Migraine   . Scoliosis   . Slurred speech    temporal lobe area that  is not a tumor causes occ slurred speech and inability to communicate/ words will not come out at the correct time  . Spinal stenosis   . Thoracic aortic aneurysm (HCC)    Stable 4.2-4.3 cm (followed by Dr. HRoxan Hockey  . Thyroid disease    Past Surgical History:  Procedure Laterality Date  . ABDOMINAL HYSTERECTOMY    . BUNIONECTOMY    . Cardiac Event Monitor  07/2017   Overall relatively normal.  Normal sinus rhythm with rare bradycardia and tachycardia.  Heart rate ranged from 55-110 bpm.  Occasional PACs and PVCs, every single 1 was felt.  No arrhythmias other than one short 4 beat run of PACs.  . COLONOSCOPY  02-04-2005   all normal   . FOOT SURGERY     3 pins in toes   . HAND SURGERY     left thumb joint resection  . nasal revision    . NM MYOVIEW LTD  07/2017   LOW RISK study.  No ischemia or infarction.  EF greater than 65%.   .[name] KitchenSINUS IRRIGATION    . TONSILLECTOMY AND ADENOIDECTOMY    . TRANSTHORACIC ECHOCARDIOGRAM  06/'1, 8/'19   a) Moderate LVH. Normal EF 60-65%. Normal diastolic parameters. --> Difficult to fully visualize the aortic valve. Cannot exclude bicuspid valve. Mild aortic stenosis noted. No PFO. Mildly dilated left atrium. Trivial  MR. No comment on mitral valve prolapse. Moderately dilated ascending aorta.;; b) F/u Echo To evaluate the aortic valve. -- Bicuspid AoV - mildly thickened / calcified. - No stenosis   Family History  Problem Relation Age of Onset  . Arthritis Mother   . Leukemia Father   . Heart disease Maternal Uncle   . Breast cancer Paternal Aunt   . Diabetes Paternal Aunt   . Breast cancer Maternal Grandmother   . Heart disease Maternal Grandfather   . Breast cancer Paternal Grandmother   . Colon cancer Neg Hx    Social History   Socioeconomic History  . Marital status: Married    Spouse name: Not on file  . Number of children: Not on file  . Years of education: Not on file  . Highest education level: Not on file  Occupational History    . Not on file  Social Needs  . Financial resource strain: Not hard at all  . Food insecurity:    Worry: Never true    Inability: Never true  . Transportation needs:    Medical: No    Non-medical: No  Tobacco Use  . Smoking status: Never Smoker  . Smokeless tobacco: Never Used  Substance and Sexual Activity  . Alcohol use: Yes    Alcohol/week: 0.0 standard drinks    Comment: occ   . Drug use: No  . Sexual activity: Not Currently  Lifestyle  . Physical activity:    Days per week: 0 days    Minutes per session: 0 min  . Stress: Not at all  Relationships  . Social connections:    Talks on phone: More than three times a week    Gets together: More than three times a week    Attends religious service: 1 to 4 times per year    Active member of club or organization: Yes    Attends meetings of clubs or organizations: More than 4 times per year    Relationship status: Married  Other Topics Concern  . Not on file  Social History Narrative  . Not on file    Outpatient Encounter Medications as of 07/24/2018  Medication Sig  . albuterol (PROAIR HFA) 108 (90 Base) MCG/ACT inhaler INHALE 2 PUFFS INTO THE LUNGS EVERY 6 (SIX) HOURS AS NEEDED.  . carisoprodol (SOMA) 350 MG tablet Take 1 tablet (350 mg total) by mouth 3 (three) times daily as needed.  . cloNIDine (CATAPRES) 0.1 MG tablet TAKE 1 TABLET BY MOUTH THREE TIMES DAILY AS NEEDED. TAKE ONLY AS NEEDED FOR SYSTOLIC BLOOD PRESSURE ABOVE 160  . cycloSPORINE (RESTASIS) 0.05 % ophthalmic emulsion Place 1 drop into both eyes 2 (two) times daily.  . Eszopiclone 3 MG TABS TAKE 1 TABLET BY MOUTH EVERY NIGHT IMMEADIATELY BEFORE BEDTIME  . famotidine (PEPCID) 20 MG tablet Take 20 mg by mouth at bedtime.  [name] Kitchen FLUoxetine (PROZAC) 40 MG capsule Take 1 capsule (40 mg total) by mouth 2 (two) times daily.  [name] Kitchen levothyroxine (SYNTHROID, LEVOTHROID) 125 MCG tablet TAKE 1 TABLET(125 MCG) BY MOUTH DAILY  . MINIVELLE 0.05 MG/24HR patch Place 1 patch onto  the skin 2 (two) times a week.   . montelukast (SINGULAIR) 10 MG tablet Take 1 tablet (10 mg total) by mouth at bedtime.  [name] Kitchen MYRBETRIQ 50 MG TB24 tablet TAKE 1 TABLET(50 MG) BY MOUTH DAILY  . omeprazole (PRILOSEC) 20 MG capsule Take 20 mg by mouth as needed.  [name] Kitchen Respiratory Therapy Supplies (FLUTTER) DEVI Use as directed  .  spironolactone (ALDACTONE) 25 MG tablet TAKE 1 TABLET(25 MG) BY MOUTH DAILY  . valsartan (DIOVAN) 320 MG tablet TAKE 1 TABLET(320 MG) BY MOUTH EVERY MORNING  . verapamil (CALAN-SR) 240 MG CR tablet TAKE 1 TABLET BY MOUTH EVERY NIGHT AT BEDTIME  . [DISCONTINUED] lidocaine (LIDODERM) 5 % Place 1 patch onto the skin daily. Remove & Discard patch within 12 hours or as directed by MD  . [DISCONTINUED] Nebivolol HCl 20 MG TABS Take 1 tablet (20 mg total) by mouth daily.  . [DISCONTINUED] verapamil (CALAN-SR) 240 MG CR tablet TAKE 1 TABLET(240 MG) BY MOUTH AT BEDTIME (Patient not taking: Reported on 07/24/2018)  . [DISCONTINUED] Vitamin D, Ergocalciferol, (DRISDOL) 50000 units CAPS capsule TAKE 1 CAPSULE BY MOUTH EVERY 7 DAYS   No facility-administered encounter medications on file as of 07/24/2018.     Activities of Daily Living In your present state of health, do you have any difficulty performing the following activities: 07/24/2018  Hearing? N  Vision? N  Difficulty concentrating or making decisions? N  Walking or climbing stairs? N  Dressing or bathing? N  Doing errands, shopping? N  Preparing Food and eating ? N  Using the Toilet? N  In the past six months, have you accidently leaked urine? N  Do you have problems with loss of bowel control? N  Managing your Medications? N  Managing your Finances? N  Housekeeping or managing your Housekeeping? N  Some recent data might be hidden    Patient Care Team: [name], MD as PCP - General (Internal Medicine) Melrose Nakayama, MD as Consulting Physician (Cardiothoracic Surgery) [name], MD as  Consulting Physician (Cardiology) Tanda Rockers, MD as Consulting Physician (Pulmonary Disease)    Assessment:   This is a routine wellness examination for Pricilla Holm.Physical assessment deferred to PCP.   Exercise Activities and Dietary recommendations Current Exercise Habits: Home exercise routine, Type of exercise: walking;treadmill, Time (Minutes): 35, Frequency (Times/Week): 4, Weekly Exercise (Minutes/Week): 140, Exercise limited by: orthopedic condition(s)  Diet (meal preparation, eat out, water intake, caffeinated beverages, dairy products, fruits and vegetables): in general, a "healthy" diet   . Reports poor appetite at times.  Reviewed heart healthy and diabetic diet. Encouraged patient to increase daily water and healthy fluid intake.  Discussed supplementing with Ensure, samples and coupons provided.  Goals    . Stay as healthy as possible     Focus on my health, stay active, enjoy life, be active in my grand-child's life, and grow old with my husband.        Fall Risk Fall Risk  07/24/2018 07/14/2017 03/28/2016 06/20/2015  Falls in the past year? No Yes Yes No  Number falls in past yr: - 2 or more 2 or more -  Injury with Fall? - - Yes -  Risk for fall due to : Impaired balance/gait;Impaired mobility Impaired mobility - -  Follow up - Falls prevention discussed;Education provided - -    Depression Screen PHQ 2/9 Scores 07/24/2018 07/14/2017 03/28/2016 06/20/2015  PHQ - 2 Score 2 2 0 3  PHQ- 9 Score 7 5 - 12     Cognitive Function MMSE - Mini Mental State Exam 07/24/2018 07/14/2017 06/20/2015  Not completed: - - Unable to complete  Orientation to time 4 5 -  Orientation to Place 5 5 -  Registration 3 3 -  Attention/ Calculation 5 5 -  Recall 1 1 -  Language- name 2 objects 2 2 -  Language- repeat  1 1 -  Language- follow 3 step command 3 3 -  Language- read & follow direction 1 1 -  Write a sentence 1 1 -  Copy design 1 1 -  Total score 27 28 -        Immunization  History  Administered Date(s) Administered  . Hep A / Hep B 02/24/2018, 06/25/2018  . Hepatitis B, adult 03/26/2018  . Influenza Split 09/12/2011, 08/19/2012  . Influenza Whole 08/20/2007, 07/29/2008, 08/07/2009, 07/25/2010  . Influenza, High Dose Seasonal PF 07/09/2016, 07/14/2017  . Influenza,inj,Quad PF,6+ Mos 07/19/2013, 08/16/2014, 07/13/2015  . Pneumococcal Conjugate-13 01/06/2015  . Pneumococcal Polysaccharide-23 07/19/2013  . Tdap 07/29/2011  . Zoster 10/28/2012   Screening Tests Health Maintenance  Topic Date Due  . INFLUENZA VACCINE  05/28/2018  . MAMMOGRAM  05/09/2019  . TETANUS/TDAP  07/28/2021  . COLONOSCOPY  02/27/2025  . DEXA SCAN  Completed  . Hepatitis C Screening  Completed  . PNA vac Low Risk Adult  Completed      Plan:     Continue doing brain stimulating activities (puzzles, reading, adult coloring books, staying active) to keep memory sharp.   Continue to eat heart healthy diet (full of fruits, vegetables, whole grains, lean protein, water--limit salt, fat, and sugar intake) and increase physical activity as tolerated.  I have personally reviewed and noted the following in the patient's chart:   . Medical and social history . Use of alcohol, tobacco or illicit drugs  . Current medications and supplements . Functional ability and status . Nutritional status . Physical activity . Advanced directives . List of other physicians . Vitals . Screenings to include cognitive, depression, and falls . Referrals and appointments  In addition, I have reviewed and discussed with patient certain preventive protocols, quality metrics, and best practice recommendations. A written personalized care plan for preventive services as well as general preventive health recommendations were provided to patient.     Michiel Cowboy, RN  07/24/2018   Medical screening examination/treatment/procedure(s) were performed by the Wellness Coach, RN. As primary care provider I was  immediately available for consulation/collaboration. I agree with above documentation. Caesar Chestnut, NP

## 2018-07-24 ENCOUNTER — Ambulatory Visit (INDEPENDENT_AMBULATORY_CARE_PROVIDER_SITE_OTHER): Payer: Medicare Other | Admitting: *Deleted

## 2018-07-24 VITALS — BP 138/82 | HR 61 | Resp 16 | Ht 64.0 in | Wt 141.0 lb

## 2018-07-24 DIAGNOSIS — Z23 Encounter for immunization: Secondary | ICD-10-CM

## 2018-07-24 DIAGNOSIS — Z0000 Encounter for general adult medical examination without abnormal findings: Secondary | ICD-10-CM

## 2018-07-24 NOTE — Patient Instructions (Addendum)
Continue doing brain stimulating activities (puzzles, reading, adult coloring books, staying active) to keep memory sharp.   Continue to eat heart healthy diet (full of fruits, vegetables, whole grains, lean protein, water--limit salt, fat, and sugar intake) and increase physical activity as tolerated.  Influenza Virus Vaccine injection What is this medicine? INFLUENZA VIRUS VACCINE (in floo EN zuh VAHY ruhs vak SEEN) helps to reduce the risk of getting influenza also known as the flu. The vaccine only helps protect you against some strains of the flu. This medicine may be used for other purposes; ask your health care provider or pharmacist if you have questions. COMMON BRAND NAME(S): Afluria, Agriflu, Alfuria, FLUAD, Fluarix, Fluarix Quadrivalent, Flublok, Flublok Quadrivalent, FLUCELVAX, Flulaval, Fluvirin, Fluzone, Fluzone High-Dose, Fluzone Intradermal What should I tell my health care provider before I take this medicine? They need to know if you have any of these conditions: -bleeding disorder like hemophilia -fever or infection -Guillain-Barre syndrome or other neurological problems -immune system problems -infection with the human immunodeficiency virus (HIV) or AIDS -low blood platelet counts -multiple sclerosis -an unusual or allergic reaction to influenza virus vaccine, latex, other medicines, foods, dyes, or preservatives. Different brands of vaccines contain different allergens. Some may contain latex or eggs. Talk to your doctor about your allergies to make sure that you get the right vaccine. -pregnant or trying to get pregnant -breast-feeding How should I use this medicine? This vaccine is for injection into a muscle or under the skin. It is given by a health care professional. A copy of Vaccine Information Statements will be given before each vaccination. Read this sheet carefully each time. The sheet may change frequently. Talk to your healthcare provider to see which  vaccines are right for you. Some vaccines should not be used in all age groups. Overdosage: If you think you have taken too much of this medicine contact a poison control center or emergency room at once. NOTE: This medicine is only for you. Do not share this medicine with others. What if I miss a dose? This does not apply. What may interact with this medicine? -chemotherapy or radiation therapy -medicines that lower your immune system like etanercept, anakinra, infliximab, and adalimumab -medicines that treat or prevent blood clots like warfarin -phenytoin -steroid medicines like prednisone or cortisone -theophylline -vaccines This list may not describe all possible interactions. Give your health care provider a list of all the medicines, herbs, non-prescription drugs, or dietary supplements you use. Also tell them if you smoke, drink alcohol, or use illegal drugs. Some items may interact with your medicine. What should I watch for while using this medicine? Report any side effects that do not go away within 3 days to your doctor or health care professional. Call your health care provider if any unusual symptoms occur within 6 weeks of receiving this vaccine. You may still catch the flu, but the illness is not usually as bad. You cannot get the flu from the vaccine. The vaccine will not protect against colds or other illnesses that may cause fever. The vaccine is needed every year. What side effects may I notice from receiving this medicine? Side effects that you should report to your doctor or health care professional as soon as possible: -allergic reactions like skin rash, itching or hives, swelling of the face, lips, or tongue Side effects that usually do not require medical attention (report to your doctor or health care professional if they continue or are bothersome): -fever -headache -muscle aches and pains -pain,  tenderness, redness, or swelling at the injection  site -tiredness This list may not describe all possible side effects. Call your doctor for medical advice about side effects. You may report side effects to FDA at 1-800-FDA-1088. Where should I keep my medicine? The vaccine will be given by a health care professional in a clinic, pharmacy, doctor's office, or other health care setting. You will not be given vaccine doses to store at home. NOTE: This sheet is a summary. It may not cover all possible information. If you have questions about this medicine, talk to your doctor, pharmacist, or health care provider.  2018 Elsevier/Gold Standard (2015-05-05 10:07:28)  Health Maintenance, Female Adopting a healthy lifestyle and getting preventive care can go a long way to promote health and wellness. Talk with your health care provider about what schedule of regular examinations is right for you. This is a good chance for you to check in with your provider about disease prevention and staying healthy. In between checkups, there are plenty of things you can do on your own. Experts have done a lot of research about which lifestyle changes and preventive measures are most likely to keep you healthy. Ask your health care provider for more information. Weight and diet Eat a healthy diet  Be sure to include plenty of vegetables, fruits, low-fat dairy products, and lean protein.  Do not eat a lot of foods high in solid fats, added sugars, or salt.  Get regular exercise. This is one of the most important things you can do for your health. ? Most adults should exercise for at least 150 minutes each week. The exercise should increase your heart rate and make you sweat (moderate-intensity exercise). ? Most adults should also do strengthening exercises at least twice a week. This is in addition to the moderate-intensity exercise.  Maintain a healthy weight  Body mass index (BMI) is a measurement that can be used to identify possible weight problems. It  estimates body fat based on height and weight. Your health care provider can help determine your BMI and help you achieve or maintain a healthy weight.  For females 73 years of age and older: ? A BMI below 18.5 is considered underweight. ? A BMI of 18.5 to 24.9 is normal. ? A BMI of 25 to 29.9 is considered overweight. ? A BMI of 30 and above is considered obese.  Watch levels of cholesterol and blood lipids  You should start having your blood tested for lipids and cholesterol at 73 years of age, then have this test every 5 years.  You may need to have your cholesterol levels checked more often if: ? Your lipid or cholesterol levels are high. ? You are older than 73 years of age. ? You are at high risk for heart disease.  Cancer screening Lung Cancer  Lung cancer screening is recommended for adults 14-36 years old who are at high risk for lung cancer because of a history of smoking.  A yearly low-dose CT scan of the lungs is recommended for people who: ? Currently smoke. ? Have quit within the past 15 years. ? Have at least a 30-pack-year history of smoking. A pack year is smoking an average of one pack of cigarettes a day for 1 year.  Yearly screening should continue until it has been 15 years since you quit.  Yearly screening should stop if you develop a health problem that would prevent you from having lung cancer treatment.  Breast Cancer  Practice breast  self-awareness. This means understanding how your breasts normally appear and feel.  It also means doing regular breast self-exams. Let your health care provider know about any changes, no matter how small.  If you are in your 20s or 30s, you should have a clinical breast exam (CBE) by a health care provider every 1-3 years as part of a regular health exam.  If you are 51 or older, have a CBE every year. Also consider having a breast X-ray (mammogram) every year.  If you have a family history of breast cancer, talk to  your health care provider about genetic screening.  If you are at high risk for breast cancer, talk to your health care provider about having an MRI and a mammogram every year.  Breast cancer gene (BRCA) assessment is recommended for women who have family members with BRCA-related cancers. BRCA-related cancers include: ? Breast. ? Ovarian. ? Tubal. ? Peritoneal cancers.  Results of the assessment will determine the need for genetic counseling and BRCA1 and BRCA2 testing.  Cervical Cancer Your health care provider may recommend that you be screened regularly for cancer of the pelvic organs (ovaries, uterus, and vagina). This screening involves a pelvic examination, including checking for microscopic changes to the surface of your cervix (Pap test). You may be encouraged to have this screening done every 3 years, beginning at age 76.  For women ages 3-65, health care providers may recommend pelvic exams and Pap testing every 3 years, or they may recommend the Pap and pelvic exam, combined with testing for human papilloma virus (HPV), every 5 years. Some types of HPV increase your risk of cervical cancer. Testing for HPV may also be done on women of any age with unclear Pap test results.  Other health care providers may not recommend any screening for nonpregnant women who are considered low risk for pelvic cancer and who do not have symptoms. Ask your health care provider if a screening pelvic exam is right for you.  If you have had past treatment for cervical cancer or a condition that could lead to cancer, you need Pap tests and screening for cancer for at least 20 years after your treatment. If Pap tests have been discontinued, your risk factors (such as having a new sexual partner) need to be reassessed to determine if screening should resume. Some women have medical problems that increase the chance of getting cervical cancer. In these cases, your health care provider may recommend more  frequent screening and Pap tests.  Colorectal Cancer  This type of cancer can be detected and often prevented.  Routine colorectal cancer screening usually begins at 73 years of age and continues through 73 years of age.  Your health care provider may recommend screening at an earlier age if you have risk factors for colon cancer.  Your health care provider may also recommend using home test kits to check for hidden blood in the stool.  A small camera at the end of a tube can be used to examine your colon directly (sigmoidoscopy or colonoscopy). This is done to check for the earliest forms of colorectal cancer.  Routine screening usually begins at age 32.  Direct examination of the colon should be repeated every 5-10 years through 73 years of age. However, you may need to be screened more often if early forms of precancerous polyps or small growths are found.  Skin Cancer  Check your skin from head to toe regularly.  Tell your health care provider about  any new moles or changes in moles, especially if there is a change in a mole's shape or color.  Also tell your health care provider if you have a mole that is larger than the size of a pencil eraser.  Always use sunscreen. Apply sunscreen liberally and repeatedly throughout the day.  Protect yourself by wearing long sleeves, pants, a wide-brimmed hat, and sunglasses whenever you are outside.  Heart disease, diabetes, and high blood pressure  High blood pressure causes heart disease and increases the risk of stroke. High blood pressure is more likely to develop in: ? People who have blood pressure in the high end of the normal range (130-139/85-89 mm Hg). ? People who are overweight or obese. ? People who are African American.  If you are 77-15 years of age, have your blood pressure checked every 3-5 years. If you are 34 years of age or older, have your blood pressure checked every year. You should have your blood pressure measured  twice-once when you are at a hospital or clinic, and once when you are not at a hospital or clinic. Record the average of the two measurements. To check your blood pressure when you are not at a hospital or clinic, you can use: ? An automated blood pressure machine at a pharmacy. ? A home blood pressure monitor.  If you are between 65 years and 42 years old, ask your health care provider if you should take aspirin to prevent strokes.  Have regular diabetes screenings. This involves taking a blood sample to check your fasting blood sugar level. ? If you are at a normal weight and have a low risk for diabetes, have this test once every three years after 73 years of age. ? If you are overweight and have a high risk for diabetes, consider being tested at a younger age or more often. Preventing infection Hepatitis B  If you have a higher risk for hepatitis B, you should be screened for this virus. You are considered at high risk for hepatitis B if: ? You were born in a country where hepatitis B is common. Ask your health care provider which countries are considered high risk. ? Your parents were born in a high-risk country, and you have not been immunized against hepatitis B (hepatitis B vaccine). ? You have HIV or AIDS. ? You use needles to inject street drugs. ? You live with someone who has hepatitis B. ? You have had sex with someone who has hepatitis B. ? You get hemodialysis treatment. ? You take certain medicines for conditions, including cancer, organ transplantation, and autoimmune conditions.  Hepatitis C  Blood testing is recommended for: ? Everyone born from [birth year] through 1966. ? Anyone with known risk factors for hepatitis C.  Sexually transmitted infections (STIs)  You should be screened for sexually transmitted infections (STIs) including gonorrhea and chlamydia if: ? You are sexually active and are younger than 73 years of age. ? You are older than 73 years of age and your  health care provider tells you that you are at risk for this type of infection. ? Your sexual activity has changed since you were last screened and you are at an increased risk for chlamydia or gonorrhea. Ask your health care provider if you are at risk.  If you do not have HIV, but are at risk, it may be recommended that you take a prescription medicine daily to prevent HIV infection. This is called pre-exposure prophylaxis (PrEP). You are considered  at risk if: ? You are sexually active and do not regularly use condoms or know the HIV status of your partner(s). ? You take drugs by injection. ? You are sexually active with a partner who has HIV.  Talk with your health care provider about whether you are at high risk of being infected with HIV. If you choose to begin PrEP, you should first be tested for HIV. You should then be tested every 3 months for as long as you are taking PrEP. Pregnancy  If you are premenopausal and you may become pregnant, ask your health care provider about preconception counseling.  If you may become pregnant, take 400 to 800 micrograms (mcg) of folic acid every day.  If you want to prevent pregnancy, talk to your health care provider about birth control (contraception). Osteoporosis and menopause  Osteoporosis is a disease in which the bones lose minerals and strength with aging. This can result in serious bone fractures. Your risk for osteoporosis can be identified using a bone density scan.  If you are 71 years of age or older, or if you are at risk for osteoporosis and fractures, ask your health care provider if you should be screened.  Ask your health care provider whether you should take a calcium or vitamin D supplement to lower your risk for osteoporosis.  Menopause may have certain physical symptoms and risks.  Hormone replacement therapy may reduce some of these symptoms and risks. Talk to your health care provider about whether hormone replacement  therapy is right for you. Follow these instructions at home:  Schedule regular health, dental, and eye exams.  Stay current with your immunizations.  Do not use any tobacco products including cigarettes, chewing tobacco, or electronic cigarettes.  If you are pregnant, do not drink alcohol.  If you are breastfeeding, limit how much and how often you drink alcohol.  Limit alcohol intake to no more than 1 drink per day for nonpregnant women. One drink equals 12 ounces of beer, 5 ounces of [place], or 1 ounces of hard liquor.  Do not use street drugs.  Do not share needles.  Ask your health care provider for help if you need support or information about quitting drugs.  Tell your health care provider if you often feel depressed.  Tell your health care provider if you have ever been abused or do not feel safe at home. This information is not intended to replace advice given to you by your health care provider. Make sure you discuss any questions you have with your health care provider. Document Released: 04/29/2011 Document Revised: 03/21/2016 Document Reviewed: 07/18/2015 Elsevier Interactive Patient Education  [employer].

## 2018-07-24 NOTE — Progress Notes (Signed)
Medical screening examination/treatment/procedure(s) were performed by the Wellness Coach, RN. As primary care provider I was immediately available for consulation/collaboration. I agree with above documentation. Inna Tisdell, NP  

## 2018-08-04 ENCOUNTER — Ambulatory Visit: Payer: Medicare Other | Admitting: Internal Medicine

## 2018-08-04 ENCOUNTER — Other Ambulatory Visit (INDEPENDENT_AMBULATORY_CARE_PROVIDER_SITE_OTHER): Payer: Medicare Other

## 2018-08-04 ENCOUNTER — Encounter: Payer: Self-pay | Admitting: Internal Medicine

## 2018-08-04 VITALS — BP 130/80 | HR 58 | Temp 98.0°F | Ht 64.0 in | Wt 141.0 lb

## 2018-08-04 DIAGNOSIS — F3341 Major depressive disorder, recurrent, in partial remission: Secondary | ICD-10-CM

## 2018-08-04 DIAGNOSIS — E039 Hypothyroidism, unspecified: Secondary | ICD-10-CM

## 2018-08-04 LAB — COMPREHENSIVE METABOLIC PANEL
ALBUMIN: 4.3 g/dL (ref 3.5–5.2)
ALK PHOS: 50 U/L (ref 39–117)
ALT: 29 U/L (ref 0–35)
AST: 25 U/L (ref 0–37)
BUN: 17 mg/dL (ref 6–23)
CALCIUM: 9.8 mg/dL (ref 8.4–10.5)
CHLORIDE: 95 meq/L — AB (ref 96–112)
CO2: 26 mEq/L (ref 19–32)
Creatinine, Ser: 0.83 mg/dL (ref 0.40–1.20)
GFR: 71.49 mL/min (ref >60.00)
Glucose, Bld: 85 mg/dL (ref 70–99)
Potassium: 4.7 mEq/L (ref 3.5–5.1)
SODIUM: 130 meq/L — AB (ref 135–145)
Total Bilirubin: 0.5 mg/dL (ref 0.2–1.2)
Total Protein: 6.8 g/dL (ref 6.0–8.3)

## 2018-08-04 LAB — CBC
HCT: 36.2 % (ref 36.0–46.0)
Hemoglobin: 12.5 g/dL (ref 12.0–15.0)
MCHC: 34.5 g/dL (ref 30.0–36.0)
MCV: 95.4 fl (ref 78.0–100.0)
PLATELETS: 212 10*3/uL (ref 150.0–400.0)
RBC: 3.79 Mil/uL — AB (ref 3.87–5.11)
RDW: 12.8 % (ref 11.5–15.5)
WBC: 5.4 10*3/uL (ref 4.0–10.5)

## 2018-08-04 LAB — T4, FREE: Free T4: 1.13 ng/dL (ref 0.60–1.60)

## 2018-08-04 LAB — TSH: TSH: 0.22 u[IU]/mL — ABNORMAL LOW (ref 0.35–4.50)

## 2018-08-04 MED ORDER — ALPRAZOLAM 0.25 MG PO TABS: 0.2500 mg | ORAL_TABLET | Freq: Every day | ORAL | 0 refills | Status: DC | PRN

## 2018-08-04 NOTE — Progress Notes (Signed)
   Subjective:    Patient ID: Marissa [name], female    DOB: 1946/08/09, 72 y.o.   MRN: [id number]  HPI The patient is a 72 YO female coming in for anxiety and irritability as well as memory changes. She feels that the problems are all related. She is having more pain which is not well controlled since her demerol is not available. She is having to co-habitate with her husband all day long and this is stressful as he is watching [employer] and is irritable towards her most of the day. She is having some problems with staying upbeat. She is taking her prozac and this is still helping some. She has been on many other antipressants in the past and had problems with many or they did not help. She does not want to switch as she is worried that the side effects will be worse. She is not sure if perhaps something to take as needed could help. She is getting in to therapy but this is not until the end of the month. Denies SI/HI.    Review of Systems  Constitutional: Positive for activity change.  HENT: Negative.   Eyes: Negative.   Respiratory: Negative for cough, chest tightness and shortness of breath.   Cardiovascular: Negative for chest pain, palpitations and leg swelling.  Gastrointestinal: Negative for abdominal distention, abdominal pain, constipation, diarrhea, nausea and vomiting.  Musculoskeletal: Negative.   Skin: Negative.   Neurological: Negative.   Psychiatric/Behavioral: Positive for decreased concentration, dysphoric mood and sleep disturbance. The patient is nervous/anxious.       Objective:   Physical Exam  Constitutional: She is oriented to person, place, and time. She appears well-developed and well-nourished.  HENT:  Head: Normocephalic and atraumatic.  Eyes: EOM are normal.  Neck: Normal range of motion.  Cardiovascular: Normal rate and regular rhythm.  Pulmonary/Chest: Effort normal and breath sounds normal. No respiratory distress. She has no wheezes. She has no rales.    Abdominal: Soft. Bowel sounds are normal. She exhibits no distension. There is no tenderness. There is no rebound.  Musculoskeletal: She exhibits no edema.  Neurological: She is alert and oriented to person, place, and time. Coordination normal.  Skin: Skin is warm and dry.  Psychiatric: Her behavior is normal. Thought content normal.   Vitals:   08/04/18 1324  BP: 130/80  Pulse: (!) 58  Temp: 98 F (36.7 C)  TempSrc: Oral  SpO2: 99%  Weight: 141 lb (64 kg)  Height: 5' 4"  (1.626 m)      Assessment & Plan:  Visit time 25 minutes: greater than 50% of that time was spent in face to face counseling and coordination of care with the patient: counseled about strategies to work on mental health, benefits of counseling, new medications on the market for mental health

## 2018-08-04 NOTE — Patient Instructions (Signed)
We have sent in xanax to use if needed.

## 2018-08-05 ENCOUNTER — Encounter: Payer: Self-pay | Admitting: Internal Medicine

## 2018-08-05 NOTE — Assessment & Plan Note (Signed)
Taking prozac and having flare. Due to poor response and experience in the past she is not open at this time to trying another medication. Will rx xanax 0.25 mg max dose 1 pill daily to see if this can help until she gets into counseling with her stress. We talked about other stress coping strategies as well to utilize.

## 2018-08-09 ENCOUNTER — Other Ambulatory Visit: Payer: Self-pay | Admitting: Internal Medicine

## 2018-08-28 ENCOUNTER — Ambulatory Visit: Payer: Medicare Other

## 2018-08-29 ENCOUNTER — Other Ambulatory Visit: Payer: Self-pay | Admitting: Cardiology

## 2018-08-30 ENCOUNTER — Other Ambulatory Visit: Payer: Self-pay | Admitting: Internal Medicine

## 2018-08-31 NOTE — Telephone Encounter (Signed)
Rx(s) sent to pharmacy electronically.  

## 2018-09-01 ENCOUNTER — Ambulatory Visit: Payer: Medicare Other

## 2018-09-03 DIAGNOSIS — Z961 Presence of intraocular lens: Secondary | ICD-10-CM | POA: Diagnosis not present

## 2018-09-03 DIAGNOSIS — H04123 Dry eye syndrome of bilateral lacrimal glands: Secondary | ICD-10-CM | POA: Diagnosis not present

## 2018-09-15 ENCOUNTER — Ambulatory Visit: Payer: Medicare Other

## 2018-09-21 ENCOUNTER — Other Ambulatory Visit: Payer: Self-pay | Admitting: Cardiology

## 2018-09-21 MED ORDER — NEBIVOLOL HCL 10 MG PO TABS: 10.0000 mg | ORAL_TABLET | Freq: Every day | ORAL | 3 refills | Status: DC

## 2018-09-21 NOTE — Telephone Encounter (Signed)
Okay to refill Bystolic.  Okay to do 90-day supply.  [name], MD

## 2018-09-21 NOTE — Addendum Note (Signed)
Addended by: Raiford Simmonds on: 09/21/2018 03:35 PM   Modules accepted: Orders

## 2018-09-23 DIAGNOSIS — M47812 Spondylosis without myelopathy or radiculopathy, cervical region: Secondary | ICD-10-CM | POA: Diagnosis not present

## 2018-09-23 DIAGNOSIS — M797 Fibromyalgia: Secondary | ICD-10-CM | POA: Diagnosis not present

## 2018-09-27 ENCOUNTER — Other Ambulatory Visit: Payer: Self-pay | Admitting: Internal Medicine

## 2018-09-28 NOTE — Telephone Encounter (Signed)
Control database checked last refill: 08/29/2018 LOV: 08/04/2018 NOV:11/05/2018

## 2018-10-02 ENCOUNTER — Other Ambulatory Visit: Payer: Self-pay | Admitting: Internal Medicine

## 2018-10-02 ENCOUNTER — Ambulatory Visit: Payer: Medicare Other | Admitting: Cardiology

## 2018-10-02 VITALS — BP 116/66 | HR 56 | Ht 64.5 in | Wt 144.8 lb

## 2018-10-02 DIAGNOSIS — R0609 Other forms of dyspnea: Secondary | ICD-10-CM

## 2018-10-02 DIAGNOSIS — Q230 Congenital stenosis of aortic valve: Secondary | ICD-10-CM

## 2018-10-02 DIAGNOSIS — E785 Hyperlipidemia, unspecified: Secondary | ICD-10-CM | POA: Insufficient documentation

## 2018-10-02 DIAGNOSIS — I10 Essential (primary) hypertension: Secondary | ICD-10-CM

## 2018-10-02 DIAGNOSIS — Q231 Congenital insufficiency of aortic valve: Secondary | ICD-10-CM

## 2018-10-02 DIAGNOSIS — I712 Thoracic aortic aneurysm, without rupture, unspecified: Secondary | ICD-10-CM

## 2018-10-02 DIAGNOSIS — G9332 Myalgic encephalomyelitis/chronic fatigue syndrome: Secondary | ICD-10-CM

## 2018-10-02 DIAGNOSIS — R0600 Dyspnea, unspecified: Secondary | ICD-10-CM

## 2018-10-02 DIAGNOSIS — R5382 Chronic fatigue, unspecified: Secondary | ICD-10-CM

## 2018-10-02 MED ORDER — VERAPAMIL HCL ER 120 MG PO TBCR: 120.0000 mg | EXTENDED_RELEASE_TABLET | Freq: Every day | ORAL | 4 refills | Status: DC

## 2018-10-02 MED ORDER — NEBIVOLOL HCL 10 MG PO TABS: 10.0000 mg | ORAL_TABLET | Freq: Every day | ORAL | 0 refills | Status: DC

## 2018-10-02 NOTE — Progress Notes (Signed)
PCP: [name], MD  CT Surgeon: Dr. Roxan Hockey   Clinic Note: Chief Complaint  Patient presents with  . Follow-up    Feels tired and fatigued.  . Hypertension    She thinks she is overmedicated  . Cardiac Valve Problem    Likely functional bicuspid aortic valve with sclerosis only.  No stenosis    HPI: Marissa [name] is a 72 y.o. female with a PMH below who presents today for 9 month f/u Bicuspid AoV, HTN & TAA.  Marissa [name] was last seen in Marh 2019 -had noted her blood pressure being in the 200s over 100 below mercury range.  Was given as needed clonidine at that time.  She says that when she has normal blood pressure, she feels pretty well.  No symptoms.  She gets short of breath may be some mild chest discomfort.  Rare palpitations.  Last Echo 05/2017 & Myoview 07/2017:  ECHO 05/2017: ef 60-65%. No RWMA. Gr 1DD. Mildly calcified - (functionally) Bicuspid Aortic Valve w/o Stenosis. (Sclerosis).   Myoview 07/2017: EF > 65%. Hyperdynamic. No ischemia or infarction. LOW RISK.  -She has been seen several times by our clinical pharmacy team in [facility] for hypertension management.  Most recent visit was in May --she had been self titrating her Bystolic dose and was down to 10 mg.  She felt lethargic with higher dose.  She had been started on spironolactone as well as her valsartan and verapamil.  Recent Hospitalizations: none  Studies Personally Reviewed - (if available, images/films reviewed: From Epic Chart or Care Everywhere)    Aorta MRI for TAA (06/2018): (Dr. Roxan Hockey) -stable, mild aneurysmal disease the ascending aorta.  Maximal diameter 2.3 cm.  Normal aortic root.  March.  Interval History: For the most part, she feels pretty well.  Her blood pressures have actually been a little low though and when that happens she feels very weak and lethargic.  Her heart rate is also been somewhat low.  She notes that with the  better blood pressure control, she is noticing more exercise intolerance with inability to get going.  Tired and fatigued.  No real chest pain just short of breath and fatigue.  She notes occasional fluttering in her chest, but no rapid irregular beats or palpitations. She has not had the high blood pressure readings as much lately and has not had to use her clonidine with exception of maybe 2 times since she was last seen.  As such, she is not having any chest pain or pressure.  No headaches or blurred vision.   No PND, orthopnea or edema. No palpitations, lightheadedness, dizziness, weakness or syncope/near syncope. No TIA/amaurosis fugax symptoms. No claudication.  ROS: A comprehensive was performed. Review of Systems  Constitutional: Positive for malaise/fatigue (Feels bad but lower blood pressure and heart rate).  HENT: Negative for congestion and sinus pain.   Respiratory: Negative for cough.   Gastrointestinal: Negative for blood in stool, heartburn and melena.  Genitourinary: Negative for hematuria.  Musculoskeletal: Positive for back pain, joint pain and myalgias.       Chronic pain  Neurological: Positive for dizziness (Sometimes when she starts exercising). Negative for focal weakness.  Psychiatric/Behavioral:       Seems a little bit lethargic and depressed today.  But at the least dysthymic  All other systems reviewed and are negative.    I have reviewed and (if needed) personally updated the patient's problem list,  medications, allergies, past medical and surgical history, social and family history.   Past Medical History:  Diagnosis Date  . Arthritis   . Asthma   . Bicuspid aortic valve 05/2017   Likely functional bicuspid aortic valve with sclerosis and no stenosis.  . Bursitis   . Cataract    mild  . Dysthymia   . Hemorrhoids   . Hx of ulcerative colitis    per dr Arnoldo Morale as per pt.  . Hyperlipidemia   . Hypertension    managed - labile.  . Migraine   .  Scoliosis   . 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 Spinal stenosis   . Thoracic aortic aneurysm (HCC)    Stable 4.2-4.3 cm (followed by Dr. Roxan Hockey)  . Thyroid disease     Past Surgical History:  Procedure Laterality Date  . ABDOMINAL HYSTERECTOMY    . BUNIONECTOMY    . Cardiac Event Monitor  07/2017   Overall relatively normal.  Normal sinus rhythm with rare bradycardia and tachycardia.  Heart rate ranged from 55-110 bpm.  Occasional PACs and PVCs, every single 1 was felt.  No arrhythmias other than one short 4 beat run of PACs.  . COLONOSCOPY  02-04-2005   all normal   . FOOT SURGERY     3 pins in toes   . HAND SURGERY     left thumb joint resection  . nasal revision    . NM MYOVIEW LTD  07/2017   LOW RISK study.  No ischemia or infarction.  EF greater than 65%.   [name] Kitchen SINUS IRRIGATION    . TONSILLECTOMY AND ADENOIDECTOMY    . TRANSTHORACIC ECHOCARDIOGRAM  06/'1, 8/'19   a) Moderate LVH. Normal EF 60-65%. Normal diastolic parameters. --> Difficult to fully visualize the aortic valve. Cannot exclude bicuspid valve. Mild aortic stenosis noted. No PFO. Mildly dilated left atrium. Trivial MR. No comment on mitral valve prolapse. Moderately dilated ascending aorta.;; b) F/u Echo To evaluate the aortic valve. -- Bicuspid AoV - mildly thickened / calcified. - No stenosis    Current Meds  Medication Sig  . albuterol (PROAIR HFA) 108 (90 Base) MCG/ACT inhaler INHALE 2 PUFFS INTO THE LUNGS EVERY 6 (SIX) HOURS AS NEEDED.  [name] Kitchen ALPRAZolam (XANAX) 0.25 MG tablet Take 1 tablet (0.25 mg total) by mouth daily as needed for anxiety.  . carisoprodol (SOMA) 350 MG tablet TAKE 1 TABLET(350 MG) BY MOUTH THREE TIMES DAILY AS NEEDED  . cloNIDine (CATAPRES) 0.1 MG tablet TAKE 1 TABLET BY MOUTH THREE TIMES DAILY AS NEEDED. TAKE ONLY AS NEEDED FOR SYSTOLIC BLOOD PRESSURE ABOVE 160  .  cycloSPORINE (RESTASIS) 0.05 % ophthalmic emulsion Place 1 drop into both eyes 2 (two) times daily.  . Eszopiclone 3 MG TABS TAKE 1 TABLET BY MOUTH IMMEDIATELY BEFORE BEDTIME  . famotidine (PEPCID) 20 MG tablet Take 20 mg by mouth at bedtime.  [name] Kitchen FLUoxetine (PROZAC) 40 MG capsule TAKE 1 CAPSULE(40 MG) BY MOUTH TWICE DAILY  . levothyroxine (SYNTHROID, LEVOTHROID) 125 MCG tablet TAKE 1 TABLET(125 MCG) BY MOUTH DAILY  . MINIVELLE 0.05 MG/24HR patch Place 1 patch onto the skin 2 (two) times a week.   . montelukast (SINGULAIR) 10 MG tablet Take 1 tablet (10 mg total) by mouth at bedtime.  [name] Kitchen MYRBETRIQ 50 MG TB24 tablet TAKE 1 TABLET(50 MG) BY MOUTH DAILY  .  nebivolol (BYSTOLIC) 10 MG tablet Take 1 tablet (10 mg total) by mouth daily. y  . omeprazole (PRILOSEC) 20 MG capsule Take 20 mg by mouth as needed.  [name] Kitchen Respiratory Therapy Supplies (FLUTTER) DEVI Use as directed  . spironolactone (ALDACTONE) 25 MG tablet TAKE 1 TABLET(25 MG) BY MOUTH DAILY  . valsartan (DIOVAN) 320 MG tablet TAKE 1 TABLET(320 MG) BY MOUTH EVERY MORNING  . [DISCONTINUED] nebivolol (BYSTOLIC) 10 MG tablet Take 1 tablet (10 mg total) by mouth daily. y  . [DISCONTINUED] verapamil (CALAN-SR) 240 MG CR tablet TAKE 1 TABLET BY MOUTH EVERY NIGHT AT BEDTIME    Allergies  Allergen Reactions  . Fentanyl Shortness Of Breath and Rash  . Amoxicillin Diarrhea    Severe diarrhea, rash to vaginal area with swelling   . Hctz [Hydrochlorothiazide] Other (See Comments)    HypoNatremia  . Hydralazine     Weak, sweats, red skin  . Codeine Other (See Comments)    makes her hyper  . Conjugated Estrogens Itching and Rash  . Erythromycin Rash    had a rash with emycin, has done ok with other meds in it's class  . Piroxicam Itching and Rash    Feldene    Social History   Tobacco Use  . Smoking status: Never Smoker  . Smokeless tobacco: Never Used  Substance Use Topics  . Alcohol use: Yes    Alcohol/week: 0.0 standard drinks    Comment:  occ   . Drug use: No   Social History   Social History Narrative  . Not on file   Family History family history includes Arthritis in her mother; Breast cancer in her maternal grandmother, paternal aunt, and paternal grandmother; Diabetes in her paternal aunt; Heart disease in her maternal grandfather and maternal uncle; Leukemia in her father.  Wt Readings from Last 3 Encounters:  10/02/18 144 lb 12.8 oz (65.7 kg)  08/04/18 141 lb (64 kg)  07/24/18 141 lb (64 kg)    PHYSICAL EXAM BP 116/66   Pulse (!) 56   Ht 5' 4.5" (1.638 m)   Wt 144 lb 12.8 oz (65.7 kg)   BMI 24.47 kg/m  Physical Exam  Constitutional: She is oriented to person, place, and time. She appears well-developed and well-nourished. No distress.  Healthy-appearing  Neck: No hepatojugular reflux and no JVD present. Carotid bruit is not present (Radiated aortic murmur).  Cardiovascular: Regular rhythm, intact distal pulses and normal pulses.  No extrasystoles are present. Bradycardia present. PMI is not displaced. Exam reveals no gallop and no friction rub.  Murmur heard.  Low-pitched harsh crescendo-decrescendo midsystolic murmur is present with a grade of 1/6 at the upper right sternal border radiating to the neck. Pulmonary/Chest: Effort normal. No respiratory distress. She has no wheezes. She has no rales.  Abdominal: Soft. Bowel sounds are normal. She exhibits no distension. There is no tenderness.  No HSM.    Musculoskeletal: Normal range of motion. She exhibits no edema.  Neurological: She is alert and oriented to person, place, and time.  Psychiatric: She has a normal mood and affect. Her behavior is normal. Judgment and thought content normal.  Vitals reviewed.   Adult ECG Report  Rate: 56 ;  Rhythm: sinus bradycardia and Normal axis, intervals and durations.  Septal MI, age undetermined.;   Narrative Interpretation: Stable EKG  Other studies Reviewed: Additional studies/ records that were reviewed today  include:  Recent Labs:   Lab Results  Component Value Date   CREATININE 0.83 08/04/2018  BUN 17 08/04/2018   NA 130 (L) 08/04/2018   K 4.7 08/04/2018   CL 95 (L) 08/04/2018   CO2 26 08/04/2018   Lab Results  Component Value Date   CHOL 222 (H) 06/05/2017   HDL 81 06/05/2017   LDLCALC 113 (H) 06/05/2017   LDLDIRECT 145.7 09/01/2013   TRIG 140 06/05/2017   CHOLHDL 2.7 06/05/2017     ASSESSMENT / PLAN: Problem List Items Addressed This Visit    Aneurysm of thoracic aorta (HCC) (Chronic)    Followed by Dr. Roxan Hockey from [place] surgery.  Stable.  Main treatment is blood pressure control. Defer imaging to CT surgery      Relevant Medications   verapamil (CALAN-SR) 120 MG CR tablet   nebivolol (BYSTOLIC) 10 MG tablet   Aortic stenosis due to bicuspid aortic valve (Chronic)   Relevant Medications   verapamil (CALAN-SR) 120 MG CR tablet   nebivolol (BYSTOLIC) 10 MG tablet   Other Relevant Orders   EKG 12-Lead   Chronic fatigue syndrome    This seems to be a chronic problem with her, but would not want exacerbated with a combination beta-blocker and high-dose verapamil.   Plan: Reduce verapamil dose by one half      Dyspnea on exertion (Chronic)    Nonischemic Myoview and relative normal echo.  I think currently her symptoms related to may be overmedication with AV nodal agents.  Will reduce verapamil dose to one half current tab.      Relevant Orders   EKG 12-Lead   Essential hypertension - Primary (Chronic)    Labile pressure, but now seems to be a little low.  With exercise intolerance and baseline bradycardia, I will reduce her verapamil dose in half.  Would like to continue the valsartan, spironolactone and Bystolic.  She has PRN clonidine that she is not having to use.  Continue intermittent follow-up appointments with [facility].      Relevant Medications   verapamil (CALAN-SR) 120 MG CR tablet   nebivolol (BYSTOLIC) 10 MG tablet   Hyperlipidemia with target LDL  less than 100 (Chronic)    Cholesterol is probably not as good as it should be controlled.  With her having fatigue issues now, I am reluctant to discuss treating with statin, but would likely need to consider some type of treatment option to prevent progression of aortic valve disease.  She will be continue to follow-up with [facility] for blood pressure control, we will have a lipid check early next year consider possible treatment at that time.      Relevant Medications   verapamil (CALAN-SR) 120 MG CR tablet   nebivolol (BYSTOLIC) 10 MG tablet   Other Relevant Orders   Lipid panel   Comprehensive metabolic panel      I spent a total of 30 minutes with the patient and chart review. >  50% of the time was spent in direct patient consultation.   Current medicines are reviewed at length with the patient today.  (+/- concerns) fatigue - ? Overmedicated.  The following changes have been made:  reduce Verapamil dose.  Patient Instructions  Medication Instructions:   DECREASE  VERAPAMIL 120 MG  TAKE AT 2 PM DAILY- MAY HOLD IF DIZZY   BYSTOLIC  TO 8 PM  AT NIGHT  If you need a refill on your cardiac medications before your next appointment, please call your pharmacy.   Lab work: LIPIDS CMP CAN DO THE DAY YOU SEE -[facility] If you have labs (blood  work) drawn today and your tests are completely normal, you will receive your results only by: [name] Kitchen MyChart Message (if you have MyChart) OR . A paper copy in the mail If you have any lab test that is abnormal or we need to change your treatment, we will call you to review the results.  Testing/Procedures: NOT NEEDED  Follow-Up: At [facility], you and your health needs are our priority.  As part of our continuing mission to provide you with exceptional heart care, we have created designated Provider Care Teams.  These Care Teams include your primary Cardiologist (physician) and Advanced Practice Providers (APPs -  Physician Assistants and Nurse  Practitioners) who all work together to provide you with the care you need, when you need it. . Your physician recommends that you schedule a follow-up appointment in 2 [place] .   Your physician recommends that you schedule a follow-up appointment in [place].   Any Other Special Instructions Will Be Listed Below (If Applicable).      Studies Ordered:   Orders Placed This Encounter  Procedures  . Lipid panel  . Comprehensive metabolic panel  . EKG 12-Lead      [name], M.D., M.S. Interventional Cardiologist   Pager # [phone] Phone # [phone] [address] [place], [place] [address]   Thank you for choosing Heartcare at [facility]!!

## 2018-10-02 NOTE — Patient Instructions (Signed)
Medication Instructions:   DECREASE  VERAPAMIL 120 MG  TAKE AT 2 PM DAILY- MAY HOLD IF DIZZY   BYSTOLIC  TO 8 PM  AT NIGHT  If you need a refill on your cardiac medications before your next appointment, please call your pharmacy.   Lab work: LIPIDS CMP CAN DO THE DAY YOU SEE -[facility] If you have labs (blood work) drawn today and your tests are completely normal, you will receive your results only by: [name] Kitchen MyChart Message (if you have MyChart) OR . A paper copy in the mail If you have any lab test that is abnormal or we need to change your treatment, we will call you to review the results.  Testing/Procedures: NOT NEEDED  Follow-Up: At [facility], you and your health needs are our priority.  As part of our continuing mission to provide you with exceptional heart care, we have created designated Provider Care Teams.  These Care Teams include your primary Cardiologist (physician) and Advanced Practice Providers (APPs -  Physician Assistants and Nurse Practitioners) who all work together to provide you with the care you need, when you need it. . Your physician recommends that you schedule a follow-up appointment in 2 [place] .   Your physician recommends that you schedule a follow-up appointment in [place].   Any Other Special Instructions Will Be Listed Below (If Applicable).

## 2018-10-04 ENCOUNTER — Encounter: Payer: Self-pay | Admitting: Cardiology

## 2018-10-04 NOTE — Assessment & Plan Note (Signed)
Nonischemic Myoview and relative normal echo.  I think currently her symptoms related to may be overmedication with AV nodal agents.  Will reduce verapamil dose to one half current tab.

## 2018-10-04 NOTE — Assessment & Plan Note (Signed)
Followed by Dr. Roxan Hockey from [place] surgery.  Stable.  Main treatment is blood pressure control. Defer imaging to CT surgery

## 2018-10-04 NOTE — Assessment & Plan Note (Signed)
This seems to be a chronic problem with her, but would not want exacerbated with a combination beta-blocker and high-dose verapamil.   Plan: Reduce verapamil dose by one half

## 2018-10-04 NOTE — Assessment & Plan Note (Addendum)
Cholesterol is probably not as good as it should be controlled.  With her having fatigue issues now, I am reluctant to discuss treating with statin, but would likely need to consider some type of treatment option to prevent progression of aortic valve disease.  She will be continue to follow-up with [facility] for blood pressure control, we will have a lipid check early next year consider possible treatment at that time.

## 2018-10-04 NOTE — Assessment & Plan Note (Addendum)
Labile pressure, but now seems to be a little low.  With exercise intolerance and baseline bradycardia, I will reduce her verapamil dose in half.  Would like to continue the valsartan, spironolactone and Bystolic.  She has PRN clonidine that she is not having to use.  Continue intermittent follow-up appointments with [facility].

## 2018-10-09 ENCOUNTER — Encounter: Payer: Self-pay | Admitting: Internal Medicine

## 2018-10-09 MED ORDER — ALBUTEROL SULFATE HFA 108 (90 BASE) MCG/ACT IN AERS: INHALATION_SPRAY | RESPIRATORY_TRACT | 2 refills | Status: DC

## 2018-10-12 DIAGNOSIS — M47812 Spondylosis without myelopathy or radiculopathy, cervical region: Secondary | ICD-10-CM | POA: Diagnosis not present

## 2018-10-16 ENCOUNTER — Other Ambulatory Visit: Payer: Self-pay | Admitting: Cardiology

## 2018-10-26 DIAGNOSIS — M47812 Spondylosis without myelopathy or radiculopathy, cervical region: Secondary | ICD-10-CM | POA: Diagnosis not present

## 2018-10-29 ENCOUNTER — Ambulatory Visit: Payer: Medicare Other

## 2018-11-02 ENCOUNTER — Telehealth: Payer: Self-pay

## 2018-11-02 NOTE — Telephone Encounter (Signed)
Called to change pt pharmacy appt left msg

## 2018-11-03 NOTE — Telephone Encounter (Signed)
Called to change pt appointment and left second msg regarding that we have to change appt due to unforseen circumstances

## 2018-11-05 ENCOUNTER — Ambulatory Visit: Payer: Medicare Other | Admitting: Internal Medicine

## 2018-11-10 ENCOUNTER — Ambulatory Visit: Payer: Medicare Other

## 2018-11-11 ENCOUNTER — Telehealth: Payer: Self-pay | Admitting: Internal Medicine

## 2018-11-11 MED ORDER — CARISOPRODOL 350 MG PO TABS: ORAL_TABLET | ORAL | 2 refills | Status: DC

## 2018-11-11 MED ORDER — ALPRAZOLAM 0.25 MG PO TABS: 0.2500 mg | ORAL_TABLET | Freq: Every day | ORAL | 2 refills | Status: DC | PRN

## 2018-11-11 NOTE — Telephone Encounter (Signed)
Control database checked last refill:  Soma- 08/29/2018  Xanax- 08/04/2018 LOV: 08/04/2018 NOV:07/26/2019

## 2018-11-11 NOTE — Telephone Encounter (Signed)
Copied from [place] [id number]. Topic: Quick Communication - Rx Refill/Question >> Nov 11, 2018  8:27 AM Scherrie Gerlach wrote: Medication: carisoprodol (SOMA) 350 MG tablet ALPRAZolam (XANAX) 0.25 MG tablet Pt states she has had problems getting refilled so she wanted to call the office.  [id number] - [address], [address] AT [facility] [phone] (Phone) [phone] (Fax)

## 2018-11-15 ENCOUNTER — Other Ambulatory Visit: Payer: Self-pay | Admitting: Internal Medicine

## 2018-11-28 HISTORY — PX: TRANSTHORACIC ECHOCARDIOGRAM: SHX275

## 2018-12-01 DIAGNOSIS — E785 Hyperlipidemia, unspecified: Secondary | ICD-10-CM | POA: Diagnosis not present

## 2018-12-02 DIAGNOSIS — I10 Essential (primary) hypertension: Secondary | ICD-10-CM | POA: Diagnosis not present

## 2018-12-02 DIAGNOSIS — M47812 Spondylosis without myelopathy or radiculopathy, cervical region: Secondary | ICD-10-CM | POA: Diagnosis not present

## 2018-12-02 DIAGNOSIS — M797 Fibromyalgia: Secondary | ICD-10-CM | POA: Diagnosis not present

## 2018-12-02 LAB — COMPREHENSIVE METABOLIC PANEL
ALK PHOS: 61 IU/L (ref 39–117)
ALT: 27 IU/L (ref 0–32)
AST: 26 IU/L (ref 0–40)
Albumin/Globulin Ratio: 2.1 (ref 1.2–2.2)
Albumin: 4.6 g/dL (ref 3.7–4.7)
BUN/Creatinine Ratio: 11 — ABNORMAL LOW (ref 12–28)
BUN: 9 mg/dL (ref 8–27)
Bilirubin Total: 0.5 mg/dL (ref 0.0–1.2)
CO2: 19 mmol/L — AB (ref 20–29)
CREATININE: 0.81 mg/dL (ref 0.57–1.00)
Calcium: 9.9 mg/dL (ref 8.7–10.3)
Chloride: 91 mmol/L — ABNORMAL LOW (ref 96–106)
GFR calc Af Amer: 83 mL/min/{1.73_m2} (ref >59)
GFR calc non Af Amer: 72 mL/min/{1.73_m2} (ref >59)
GLUCOSE: 83 mg/dL (ref 65–99)
Globulin, Total: 2.2 g/dL (ref 1.5–4.5)
Potassium: 4.8 mmol/L (ref 3.5–5.2)
Sodium: 131 mmol/L — ABNORMAL LOW (ref 134–144)
Total Protein: 6.8 g/dL (ref 6.0–8.5)

## 2018-12-02 LAB — LIPID PANEL
Chol/HDL Ratio: 3 ratio (ref 0.0–4.4)
Cholesterol, Total: 297 mg/dL — ABNORMAL HIGH (ref 100–199)
HDL: 99 mg/dL (ref >39)
LDL CALC: 183 mg/dL — AB (ref 0–99)
Triglycerides: 73 mg/dL (ref 0–149)
VLDL CHOLESTEROL CAL: 15 mg/dL (ref 5–40)

## 2018-12-07 ENCOUNTER — Ambulatory Visit (INDEPENDENT_AMBULATORY_CARE_PROVIDER_SITE_OTHER): Payer: Medicare Other | Admitting: Pharmacist

## 2018-12-07 VITALS — BP 152/90 | HR 65 | Resp 15 | Ht 65.0 in | Wt 137.0 lb

## 2018-12-07 DIAGNOSIS — E785 Hyperlipidemia, unspecified: Secondary | ICD-10-CM

## 2018-12-07 DIAGNOSIS — I10 Essential (primary) hypertension: Secondary | ICD-10-CM | POA: Diagnosis not present

## 2018-12-07 MED ORDER — VERAPAMIL HCL ER 180 MG PO TBCR: 180.0000 mg | EXTENDED_RELEASE_TABLET | Freq: Every day | ORAL | 2 refills | Status: DC

## 2018-12-07 MED ORDER — ROSUVASTATIN CALCIUM 10 MG PO TABS: ORAL_TABLET | ORAL | 1 refills | Status: DC

## 2018-12-07 NOTE — Progress Notes (Signed)
Patient ID: Charmain Diosdado                 DOB: 10/09/1946                      MRN: [id number]     HPI: Marissa [name] is a 73 y.o. female referred by Dr. Ellyn Hack to HTN clinic. PMH includes HTN, thoracis aneurysm,aortic stenosis, asthma, severe neurological urinary incontinence, heart valve regurgitation, hyperlipidemia, chronic pain, and migraine.  Chronic pain seems to be the biggest challenge for her and currently undergoing pain management therapy with Dr Maryjean Ka (neurologist). During most recent OV with Dr Ellyn Hack her Verapamil dose was decreased from 282m daily to 1249mdaily due to exercise intolerance and bradycardia.   Patient reports fall about 4 weeks ago caused by sleeping medication and dizziness. Still getting injections for back pain and is waiting for final desicion in regards to potential ablation. She also reports increased need for clonidine use due after fall. Patient is not sure of current verapamil dose and continues to have labile pressure.  Current HTN meds:  Clonidine 0.1 mg hs and prn SBP > 160 nebivolol 1040maily Spironolactone 25m55mily Valsartan 320mg20m Verapamil 120mg 44m??  Intolerance: Diuretics (hydralazine and chlorthalidone) - low sodium, urinary incontinence Hydralazine - join pain, red skin, weakness  BP goal: 140/90 due to hx of dizziness and fainting with lower BP  LDL goal: < 100mg/d66mr Dr HardingEllyn Hacky History: no significant from mother and father, heart disease from uncle  Social History: scotch 2-3x day to help with her pain (one shot around 3-4 pm then every few hours until night); 1 cup coffee in am; no tobacco  Diet: decreased appetite d/t pain issues; eats mostly home cooked, no sweets or fats; only rare red meats  Exercise: Previously did yoga but with hip/back problems is not able to get to floor any longer  Home BP readings:   None provided  Relevant labs: 12/01/2018:  Scr 0.81; Na 131; K 4.8; Cl 9.9; AST 26; ALT 27; CHO  297; TG 73; LDL 183  Wt Readings from Last 3 Encounters:  12/07/18 137 lb (62.1 kg)  10/02/18 144 lb 12.8 oz (65.7 kg)  08/04/18 141 lb (64 kg)   BP Readings from Last 3 Encounters:  12/07/18 (!) 152/90  10/02/18 116/66  08/04/18 130/80   Pulse Readings from Last 3 Encounters:  12/07/18 65  10/02/18 (!) 56  08/04/18 (!) 58    Past Medical History:  Diagnosis Date  . Arthritis   . Asthma   . Bicuspid aortic valve 05/2017   Likely functional bicuspid aortic valve with sclerosis and no stenosis.  . Bursitis   . Cataract    mild  . Dysthymia   . Hemorrhoids   . Hx of ulcerative colitis    per dr jenkinsArnoldo Morale pt.  . Hyperlipidemia   . Hypertension    managed - labile.  . Migraine   . Scoliosis   . 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 Spinal stenosis   . Thoracic aortic aneurysm (HCC)    Stable 4.2-4.3 cm (followed by Dr. HendricRoxan Hockeyyroid disease     Current Outpatient Medications on File Prior to Visit  Medication Sig Dispense Refill  . albuterol (PROAIR HFA)  108 (90 Base) MCG/ACT inhaler INHALE 2 PUFFS INTO THE LUNGS EVERY 6 (SIX) HOURS AS NEEDED. 8.5 each 2  . ALPRAZolam (XANAX) 0.25 MG tablet Take 1 tablet (0.25 mg total) by mouth daily as needed for anxiety. 30 tablet 2  . carisoprodol (SOMA) 350 MG tablet TAKE 1 TABLET(350 MG) BY MOUTH THREE TIMES DAILY AS NEEDED 60 tablet 2  . cloNIDine (CATAPRES) 0.1 MG tablet TAKE 1 TABLET BY MOUTH THREE TIMES DAILY AS NEEDED. TAKE ONLY AS NEEDED FOR SYSTOLIC BLOOD PRESSURE ABOVE 160 15 tablet 6  . Eszopiclone 3 MG TABS TAKE 1 TABLET BY MOUTH IMMEDIATELY BEFORE BEDTIME 30 tablet 3  . famotidine (PEPCID) 20 MG tablet Take 20 mg by mouth at bedtime.    [name] Kitchen FLUoxetine (PROZAC) 40 MG capsule TAKE 1 CAPSULE(40 MG) BY MOUTH TWICE DAILY 60 capsule 5  . levothyroxine (SYNTHROID, LEVOTHROID) 125 MCG tablet  TAKE 1 TABLET(125 MCG) BY MOUTH DAILY 90 tablet 1  . MINIVELLE 0.05 MG/24HR patch Place 1 patch onto the skin 2 (two) times a week.   3  . montelukast (SINGULAIR) 10 MG tablet Take 1 tablet (10 mg total) by mouth at bedtime. 90 tablet 1  . MYRBETRIQ 50 MG TB24 tablet TAKE 1 TABLET(50 MG) BY MOUTH DAILY 90 tablet 0  . nebivolol (BYSTOLIC) 10 MG tablet Take 1 tablet (10 mg total) by mouth daily. y 21 tablet 0  . omeprazole (PRILOSEC) 20 MG capsule Take 20 mg by mouth as needed.    [name] Kitchen Respiratory Therapy Supplies (FLUTTER) DEVI Use as directed 1 each 0  . spironolactone (ALDACTONE) 25 MG tablet TAKE 1 TABLET(25 MG) BY MOUTH DAILY 30 tablet 3  . valsartan (DIOVAN) 320 MG tablet TAKE 1 TABLET(320 MG) BY MOUTH EVERY MORNING 30 tablet 6  . BELBUCA 75 MCG FILM Take 1 patch by mouth as directed.  0  . cycloSPORINE (RESTASIS) 0.05 % ophthalmic emulsion Place 1 drop into both eyes 2 (two) times daily.     No current facility-administered medications on file prior to visit.     Allergies  Allergen Reactions  . Fentanyl Shortness Of Breath and Rash  . Amoxicillin Diarrhea    Severe diarrhea, rash to vaginal area with swelling   . Hctz [Hydrochlorothiazide] Other (See Comments)    HypoNatremia  . Hydralazine     Weak, sweats, red skin  . Codeine Other (See Comments)    makes her hyper  . Conjugated Estrogens Itching and Rash  . Erythromycin Rash    had a rash with emycin, has done ok with other meds in it's class  . Piroxicam Itching and Rash    Feldene    Blood pressure (!) 152/90, pulse 65, resp. rate 15, height 5' 5"  (1.651 m), weight 137 lb (62.1 kg).  Essential hypertension BP elevated after verapamil dose and recent fall. Patient continues to work with neurologist for dizziness, and chronic back pain. Will increase valsartan from 128m to 1837mdaily and continue all other medication as previously prescribed. Plan to follow up with HTN clinic in 4 weeks to monitor BP and HR.    Hyperlipidemia with target LDL less than 100 LDL considerably elevated in comparison to 1 year ago. We discussed positive changes in diet like lowering butter, cheese, and carbohydrate intake.  Rosuvastatin started as recommended by Dr HaEllyn Hackut will start at  1025mnstead of 14m61m assess for potential ADR.  Will start rosuvastatin at 10mg70mry Monday and Friday for 2 weeks, then 10mg53m  every Monday, Wednesday, and Friday for 2 weeks, then discuss therapy during f/u visit with pharmacist to determine if able to increase dose to 36m daily.   Kimoni Pagliarulo Rodriguez-Guzman PharmD, BCPS, [email or web address]/08/2019 12:49 PM

## 2018-12-07 NOTE — Patient Instructions (Addendum)
Return for a llow up appointment in 4 weeks  Check your blood pressure at home daily (if able) and keep record of the readings.  Take your meds as follows: *CHANGE verapamil dose from 132m to 1851mevery evening*  *START rosuvastatin 1070mvery Monday and Friday in the evening for 2 weeks, then increase to 60m31mery Monday, Wednesday and Friday (if tolerating) in the evening for 2 weeks, then call lipid clinic (pharmacist) at [phone] symptoms before further titration*  Bring all of your meds, your BP cuff and your record of home blood pressures to your next appointment.  Exercise as you're able, try to walk approximately 30 minutes per day.  Keep salt intake to a minimum, especially watch canned and prepared boxed foods.  Eat more fresh fruits and vegetables and fewer canned items.  Avoid eating in fast food restaurants.    HOW TO TAKE YOUR BLOOD PRESSURE: . Rest 5 minutes before taking your blood pressure. .  Don't smoke or drink caffeinated beverages for at least 30 minutes before. . Take your blood pressure before (not after) you eat. . Sit comfortably with your back supported and both feet on the floor (don't cross your legs). . Elevate your arm to heart level on a table or a desk. . Use the proper sized cuff. It should fit smoothly and snugly around your bare upper arm. There should be enough room to slip a fingertip under the cuff. The bottom edge of the cuff should be 1 inch above the crease of the elbow. . Ideally, take 3 measurements at one sitting and record the average.

## 2018-12-08 ENCOUNTER — Encounter: Payer: Self-pay | Admitting: Pharmacist

## 2018-12-08 DIAGNOSIS — H01003 Unspecified blepharitis right eye, unspecified eyelid: Secondary | ICD-10-CM | POA: Diagnosis not present

## 2018-12-08 DIAGNOSIS — H04123 Dry eye syndrome of bilateral lacrimal glands: Secondary | ICD-10-CM | POA: Diagnosis not present

## 2018-12-08 DIAGNOSIS — Z961 Presence of intraocular lens: Secondary | ICD-10-CM | POA: Diagnosis not present

## 2018-12-08 DIAGNOSIS — H40013 Open angle with borderline findings, low risk, bilateral: Secondary | ICD-10-CM | POA: Diagnosis not present

## 2018-12-08 NOTE — Assessment & Plan Note (Addendum)
BP elevated after verapamil dose and recent fall. Patient continues to work with neurologist for dizziness, and chronic back pain. Will increase valsartan from 115m to 1841mdaily and continue all other medication as previously prescribed. Plan to follow up with HTN clinic in 4 weeks to monitor BP and HR.

## 2018-12-08 NOTE — Assessment & Plan Note (Signed)
LDL considerably elevated in comparison to 1 year ago. We discussed positive changes in diet like lowering butter, cheese, and carbohydrate intake.  Rosuvastatin started as recommended by Dr Ellyn Hack but will start at  24m instead of 251mto assess for potential ADR.  Will start rosuvastatin at 1065mvery Monday and Friday for 2 weeks, then 74m61mery Monday, Wednesday, and Friday for 2 weeks, then discuss therapy during f/u visit with pharmacist to determine if able to increase dose to 74mg63mly.

## 2018-12-09 ENCOUNTER — Encounter: Payer: Self-pay | Admitting: *Deleted

## 2018-12-10 ENCOUNTER — Telehealth: Payer: Self-pay | Admitting: *Deleted

## 2018-12-10 DIAGNOSIS — E785 Hyperlipidemia, unspecified: Secondary | ICD-10-CM

## 2018-12-10 DIAGNOSIS — I10 Essential (primary) hypertension: Secondary | ICD-10-CM

## 2018-12-10 NOTE — Telephone Encounter (Signed)
Mailed letter with lab result summary as requested

## 2018-12-10 NOTE — Telephone Encounter (Signed)
-----   Message from [name], MD sent at 12/03/2018  9:07 PM EST ----- Cholesterol panel looks a lot worse than it was a year ago.  LDL is at the 183.  Total cholesterol 297.  At this point I really do think we need to treat.  I would like to try intermediate dose of rosuvastatin and take it starting off 2 days a week and increasing 1 more day a week every 2 weeks until potentially taking it every day week.  Recheck labs in 4-5 months once she is on a stable dose.  [name], MD   Rx: rosuvastatin 25m PO daily as directed. Disp # 30 with 11 refills. Sig: start 1 tab 2 days per week x 2 weeks --> then increase each 2 weeks to 3-4-5-6-7 d/week.  Check FLP/CMP in 5 months.  DGlenetta Hew MD

## 2018-12-10 NOTE — Telephone Encounter (Signed)
See mychart message- mailed summary with lab results as requested.  lbs ordered for  5 months -lipid , cmp

## 2018-12-14 ENCOUNTER — Encounter: Payer: Self-pay | Admitting: Internal Medicine

## 2018-12-15 ENCOUNTER — Other Ambulatory Visit: Payer: Self-pay | Admitting: Internal Medicine

## 2018-12-16 ENCOUNTER — Telehealth: Payer: Self-pay

## 2018-12-16 NOTE — Telephone Encounter (Signed)
Pt called to report that they are having a reaction with their cholesterol medication stated that she is messed up because dizzy, weak, blurry vision, stomach ache, myalgia, severe headaches, stated that she didn't feel right in the head right now.

## 2018-12-16 NOTE — Telephone Encounter (Signed)
Recommendation:  1. Okay to hold rosuvastatin for now  2. Monitor BP twice daily and bring records to f/u appointment.

## 2018-12-16 NOTE — Telephone Encounter (Signed)
Called pt to let her know to hold rosuvastatin for now and monitor bp bid via raquel's orders

## 2018-12-17 ENCOUNTER — Encounter: Payer: Self-pay | Admitting: Internal Medicine

## 2018-12-21 ENCOUNTER — Inpatient Hospital Stay (HOSPITAL_COMMUNITY)
Admission: EM | Admit: 2018-12-21 | Discharge: 2018-12-24 | DRG: 640 | Disposition: A | Discharge status: Home Health | Payer: Medicare Other | Attending: Internal Medicine | Admitting: Internal Medicine

## 2018-12-21 ENCOUNTER — Telehealth: Payer: Self-pay | Admitting: Cardiology

## 2018-12-21 ENCOUNTER — Encounter (HOSPITAL_COMMUNITY): Payer: Self-pay | Admitting: Emergency Medicine

## 2018-12-21 ENCOUNTER — Emergency Department (HOSPITAL_COMMUNITY): Payer: Medicare Other

## 2018-12-21 ENCOUNTER — Other Ambulatory Visit: Payer: Self-pay

## 2018-12-21 DIAGNOSIS — R079 Chest pain, unspecified: Secondary | ICD-10-CM | POA: Diagnosis not present

## 2018-12-21 DIAGNOSIS — F1011 Alcohol abuse, in remission: Secondary | ICD-10-CM | POA: Diagnosis present

## 2018-12-21 DIAGNOSIS — R0789 Other chest pain: Secondary | ICD-10-CM | POA: Diagnosis present

## 2018-12-21 DIAGNOSIS — I712 Thoracic aortic aneurysm, without rupture: Secondary | ICD-10-CM | POA: Diagnosis not present

## 2018-12-21 DIAGNOSIS — Z885 Allergy status to narcotic agent status: Secondary | ICD-10-CM

## 2018-12-21 DIAGNOSIS — Z8249 Family history of ischemic heart disease and other diseases of the circulatory system: Secondary | ICD-10-CM

## 2018-12-21 DIAGNOSIS — Z881 Allergy status to other antibiotic agents status: Secondary | ICD-10-CM

## 2018-12-21 DIAGNOSIS — R5383 Other fatigue: Secondary | ICD-10-CM | POA: Diagnosis not present

## 2018-12-21 DIAGNOSIS — E860 Dehydration: Secondary | ICD-10-CM | POA: Diagnosis present

## 2018-12-21 DIAGNOSIS — Z803 Family history of malignant neoplasm of breast: Secondary | ICD-10-CM

## 2018-12-21 DIAGNOSIS — I77810 Thoracic aortic ectasia: Secondary | ICD-10-CM | POA: Diagnosis present

## 2018-12-21 DIAGNOSIS — Z8261 Family history of arthritis: Secondary | ICD-10-CM

## 2018-12-21 DIAGNOSIS — I341 Nonrheumatic mitral (valve) prolapse: Secondary | ICD-10-CM | POA: Diagnosis present

## 2018-12-21 DIAGNOSIS — K5190 Ulcerative colitis, unspecified, without complications: Secondary | ICD-10-CM | POA: Diagnosis not present

## 2018-12-21 DIAGNOSIS — Z79899 Other long term (current) drug therapy: Secondary | ICD-10-CM

## 2018-12-21 DIAGNOSIS — Z806 Family history of leukemia: Secondary | ICD-10-CM

## 2018-12-21 DIAGNOSIS — F329 Major depressive disorder, single episode, unspecified: Secondary | ICD-10-CM | POA: Diagnosis present

## 2018-12-21 DIAGNOSIS — M797 Fibromyalgia: Secondary | ICD-10-CM | POA: Diagnosis present

## 2018-12-21 DIAGNOSIS — I350 Nonrheumatic aortic (valve) stenosis: Secondary | ICD-10-CM | POA: Diagnosis present

## 2018-12-21 DIAGNOSIS — E785 Hyperlipidemia, unspecified: Secondary | ICD-10-CM | POA: Diagnosis not present

## 2018-12-21 DIAGNOSIS — E861 Hypovolemia: Secondary | ICD-10-CM | POA: Diagnosis present

## 2018-12-21 DIAGNOSIS — E440 Moderate protein-calorie malnutrition: Secondary | ICD-10-CM | POA: Diagnosis not present

## 2018-12-21 DIAGNOSIS — K5900 Constipation, unspecified: Secondary | ICD-10-CM

## 2018-12-21 DIAGNOSIS — E871 Hypo-osmolality and hyponatremia: Secondary | ICD-10-CM | POA: Diagnosis not present

## 2018-12-21 DIAGNOSIS — G9341 Metabolic encephalopathy: Secondary | ICD-10-CM | POA: Diagnosis present

## 2018-12-21 DIAGNOSIS — E46 Unspecified protein-calorie malnutrition: Secondary | ICD-10-CM

## 2018-12-21 DIAGNOSIS — E876 Hypokalemia: Secondary | ICD-10-CM | POA: Diagnosis present

## 2018-12-21 DIAGNOSIS — D509 Iron deficiency anemia, unspecified: Secondary | ICD-10-CM | POA: Diagnosis present

## 2018-12-21 DIAGNOSIS — I10 Essential (primary) hypertension: Secondary | ICD-10-CM | POA: Diagnosis not present

## 2018-12-21 DIAGNOSIS — R0602 Shortness of breath: Secondary | ICD-10-CM | POA: Diagnosis not present

## 2018-12-21 DIAGNOSIS — Z833 Family history of diabetes mellitus: Secondary | ICD-10-CM

## 2018-12-21 DIAGNOSIS — F419 Anxiety disorder, unspecified: Secondary | ICD-10-CM | POA: Diagnosis not present

## 2018-12-21 DIAGNOSIS — Z880 Allergy status to penicillin: Secondary | ICD-10-CM

## 2018-12-21 DIAGNOSIS — R32 Unspecified urinary incontinence: Secondary | ICD-10-CM | POA: Diagnosis present

## 2018-12-21 DIAGNOSIS — M419 Scoliosis, unspecified: Secondary | ICD-10-CM | POA: Diagnosis present

## 2018-12-21 DIAGNOSIS — K0889 Other specified disorders of teeth and supporting structures: Secondary | ICD-10-CM | POA: Diagnosis present

## 2018-12-21 DIAGNOSIS — Z888 Allergy status to other drugs, medicaments and biological substances status: Secondary | ICD-10-CM

## 2018-12-21 DIAGNOSIS — J45909 Unspecified asthma, uncomplicated: Secondary | ICD-10-CM | POA: Diagnosis present

## 2018-12-21 DIAGNOSIS — G8929 Other chronic pain: Secondary | ICD-10-CM | POA: Diagnosis present

## 2018-12-21 DIAGNOSIS — I4581 Long QT syndrome: Secondary | ICD-10-CM | POA: Diagnosis not present

## 2018-12-21 DIAGNOSIS — F1010 Alcohol abuse, uncomplicated: Secondary | ICD-10-CM | POA: Diagnosis not present

## 2018-12-21 DIAGNOSIS — R9431 Abnormal electrocardiogram [ECG] [EKG]: Secondary | ICD-10-CM | POA: Diagnosis present

## 2018-12-21 DIAGNOSIS — R0989 Other specified symptoms and signs involving the circulatory and respiratory systems: Secondary | ICD-10-CM | POA: Diagnosis present

## 2018-12-21 DIAGNOSIS — E873 Alkalosis: Secondary | ICD-10-CM

## 2018-12-21 DIAGNOSIS — G4700 Insomnia, unspecified: Secondary | ICD-10-CM | POA: Diagnosis present

## 2018-12-21 DIAGNOSIS — E874 Mixed disorder of acid-base balance: Secondary | ICD-10-CM | POA: Diagnosis present

## 2018-12-21 DIAGNOSIS — E538 Deficiency of other specified B group vitamins: Secondary | ICD-10-CM | POA: Diagnosis present

## 2018-12-21 DIAGNOSIS — E43 Unspecified severe protein-calorie malnutrition: Secondary | ICD-10-CM | POA: Diagnosis present

## 2018-12-21 DIAGNOSIS — E039 Hypothyroidism, unspecified: Secondary | ICD-10-CM | POA: Diagnosis present

## 2018-12-21 DIAGNOSIS — Z79890 Hormone replacement therapy (postmenopausal): Secondary | ICD-10-CM

## 2018-12-21 DIAGNOSIS — R4182 Altered mental status, unspecified: Secondary | ICD-10-CM | POA: Diagnosis not present

## 2018-12-21 DIAGNOSIS — R51 Headache: Secondary | ICD-10-CM | POA: Diagnosis not present

## 2018-12-21 DIAGNOSIS — R202 Paresthesia of skin: Secondary | ICD-10-CM | POA: Diagnosis not present

## 2018-12-21 LAB — CBC WITH DIFFERENTIAL/PLATELET
Abs Immature Granulocytes: 0.01 10*3/uL (ref 0.00–0.07)
Basophils Absolute: 0 10*3/uL (ref 0.0–0.1)
Basophils Relative: 0 %
EOS ABS: 0 10*3/uL (ref 0.0–0.5)
Eosinophils Relative: 1 %
HCT: 34.3 % — ABNORMAL LOW (ref 36.0–46.0)
Hemoglobin: 12.4 g/dL (ref 12.0–15.0)
IMMATURE GRANULOCYTES: 0 %
Lymphocytes Relative: 18 %
Lymphs Abs: 1.1 10*3/uL (ref 0.7–4.0)
MCH: 32.5 pg (ref 26.0–34.0)
MCHC: 36.2 g/dL — ABNORMAL HIGH (ref 30.0–36.0)
MCV: 90 fL (ref 80.0–100.0)
Monocytes Absolute: 0.6 10*3/uL (ref 0.1–1.0)
Monocytes Relative: 10 %
Neutro Abs: 4.3 10*3/uL (ref 1.7–7.7)
Neutrophils Relative %: 71 %
Platelets: 229 10*3/uL (ref 150–400)
RBC: 3.81 MIL/uL — ABNORMAL LOW (ref 3.87–5.11)
RDW: 11.9 % (ref 11.5–15.5)
WBC: 6.1 10*3/uL (ref 4.0–10.5)
nRBC: 0 % (ref 0.0–0.2)

## 2018-12-21 LAB — COMPREHENSIVE METABOLIC PANEL
ALT: 33 U/L (ref 0–44)
AST: 34 U/L (ref 15–41)
Albumin: 4.2 g/dL (ref 3.5–5.0)
Alkaline Phosphatase: 38 U/L (ref 38–126)
Anion gap: 16 — ABNORMAL HIGH (ref 5–15)
BUN: 6 mg/dL — ABNORMAL LOW (ref 8–23)
CO2: 17 mmol/L — ABNORMAL LOW (ref 22–32)
Calcium: 9.1 mg/dL (ref 8.9–10.3)
Chloride: 87 mmol/L — ABNORMAL LOW (ref 98–111)
Creatinine, Ser: 1.18 mg/dL — ABNORMAL HIGH (ref 0.44–1.00)
GFR calc non Af Amer: 45 mL/min — ABNORMAL LOW (ref >60)
GFR, EST AFRICAN AMERICAN: 53 mL/min — AB (ref >60)
Glucose, Bld: 115 mg/dL — ABNORMAL HIGH (ref 70–99)
Potassium: 3.2 mmol/L — ABNORMAL LOW (ref 3.5–5.1)
Sodium: 120 mmol/L — ABNORMAL LOW (ref 135–145)
Total Bilirubin: 0.9 mg/dL (ref 0.3–1.2)
Total Protein: 6.5 g/dL (ref 6.5–8.1)

## 2018-12-21 LAB — CBG MONITORING, ED: Glucose-Capillary: 119 mg/dL — ABNORMAL HIGH (ref 70–99)

## 2018-12-21 LAB — MAGNESIUM: MAGNESIUM: 1.1 mg/dL — AB (ref 1.7–2.4)

## 2018-12-21 LAB — URINALYSIS, ROUTINE W REFLEX MICROSCOPIC
Bilirubin Urine: NEGATIVE
GLUCOSE, UA: NEGATIVE mg/dL
Hgb urine dipstick: NEGATIVE
Ketones, ur: 5 mg/dL — AB
Leukocytes,Ua: NEGATIVE
Nitrite: NEGATIVE
Protein, ur: NEGATIVE mg/dL
Specific Gravity, Urine: 1.006 (ref 1.005–1.030)
pH: 6 (ref 5.0–8.0)

## 2018-12-21 LAB — POCT I-STAT EG7
Acid-base deficit: 1 mmol/L (ref 0.0–2.0)
Bicarbonate: 17.4 mmol/L — ABNORMAL LOW (ref 20.0–28.0)
Calcium, Ion: 1 mmol/L — ABNORMAL LOW (ref 1.15–1.40)
HCT: 31 % — ABNORMAL LOW (ref 36.0–46.0)
HEMOGLOBIN: 10.5 g/dL — AB (ref 12.0–15.0)
O2 Saturation: 47 %
Patient temperature: 37
Potassium: 4.1 mmol/L (ref 3.5–5.1)
Sodium: 118 mmol/L — CL (ref 135–145)
TCO2: 18 mmol/L — ABNORMAL LOW (ref 22–32)
pCO2, Ven: 15.7 mmHg — CL (ref 44.0–60.0)
pH, Ven: 7.654 (ref 7.250–7.430)
pO2, Ven: 19 mmHg — CL (ref 32.0–45.0)

## 2018-12-21 LAB — TROPONIN I
Troponin I: <0.03 ng/mL (ref <0.03)
Troponin I: <0.03 ng/mL (ref <0.03)

## 2018-12-21 LAB — OSMOLALITY, URINE: Osmolality, Ur: 149 mOsm/kg — ABNORMAL LOW (ref 300–900)

## 2018-12-21 LAB — BRAIN NATRIURETIC PEPTIDE: B NATRIURETIC PEPTIDE 5: 114.2 pg/mL — AB (ref 0.0–100.0)

## 2018-12-21 LAB — BETA-HYDROXYBUTYRIC ACID: BETA-HYDROXYBUTYRIC ACID: 1.04 mmol/L — AB (ref 0.05–0.27)

## 2018-12-21 LAB — SODIUM, URINE, RANDOM: Sodium, Ur: 13 mmol/L

## 2018-12-21 LAB — OSMOLALITY: Osmolality: 249 mOsm/kg — CL (ref 275–295)

## 2018-12-21 LAB — SALICYLATE LEVEL: Salicylate Lvl: <7 mg/dL (ref 2.8–30.0)

## 2018-12-21 LAB — ETHANOL

## 2018-12-21 LAB — PHOSPHORUS: Phosphorus: 1.7 mg/dL — ABNORMAL LOW (ref 2.5–4.6)

## 2018-12-21 LAB — TSH: TSH: 0.547 u[IU]/mL (ref 0.350–4.500)

## 2018-12-21 LAB — ACETAMINOPHEN LEVEL: Acetaminophen (Tylenol), Serum: <10 ug/mL — ABNORMAL LOW (ref 10–30)

## 2018-12-21 LAB — CK: Total CK: 100 U/L (ref 38–234)

## 2018-12-21 MED ORDER — NEBIVOLOL HCL 5 MG PO TABS
10.0000 mg | ORAL_TABLET | Freq: Every day | ORAL | Status: DC
  Administered 2018-12-22 – 2018-12-24 (×4): 10 mg via ORAL
  Filled 2018-12-21: qty 2
  Filled 2018-12-21 (×3): qty 1
  Filled 2018-12-21: qty 2

## 2018-12-21 MED ORDER — SODIUM CHLORIDE 0.9 % IV SOLN
INTRAVENOUS | Status: DC
  Administered 2018-12-21: 75 mL/h via INTRAVENOUS

## 2018-12-21 MED ORDER — LACTATED RINGERS IV BOLUS
1000.0000 mL | Freq: Once | INTRAVENOUS | Status: AC
  Administered 2018-12-21: 1000 mL via INTRAVENOUS

## 2018-12-21 MED ORDER — POTASSIUM CHLORIDE 10 MEQ/100ML IV SOLN
10.0000 meq | INTRAVENOUS | Status: AC
  Administered 2018-12-21 (×2): 10 meq via INTRAVENOUS
  Filled 2018-12-21 (×2): qty 100

## 2018-12-21 MED ORDER — THIAMINE HCL 100 MG/ML IJ SOLN
500.0000 mg | Freq: Three times a day (TID) | INTRAVENOUS | Status: DC
  Administered 2018-12-21 – 2018-12-22 (×3): 500 mg via INTRAVENOUS
  Filled 2018-12-21 (×4): qty 5

## 2018-12-21 MED ORDER — BOOST / RESOURCE BREEZE PO LIQD CUSTOM
1.0000 | Freq: Three times a day (TID) | ORAL | Status: DC
  Administered 2018-12-21: 23:00:00 via ORAL
  Administered 2018-12-22 – 2018-12-24 (×5): 1 via ORAL
  Filled 2018-12-21: qty 1

## 2018-12-21 MED ORDER — LEVOTHYROXINE SODIUM 25 MCG PO TABS
125.0000 ug | ORAL_TABLET | Freq: Every day | ORAL | Status: DC
  Administered 2018-12-22 – 2018-12-24 (×3): 125 ug via ORAL
  Filled 2018-12-21 (×5): qty 1

## 2018-12-21 MED ORDER — POTASSIUM CHLORIDE CRYS ER 20 MEQ PO TBCR
40.0000 meq | EXTENDED_RELEASE_TABLET | Freq: Once | ORAL | Status: AC
  Administered 2018-12-21: 40 meq via ORAL
  Filled 2018-12-21: qty 2

## 2018-12-21 MED ORDER — MAGNESIUM SULFATE 2 GM/50ML IV SOLN
2.0000 g | Freq: Once | INTRAVENOUS | Status: AC
  Administered 2018-12-21: 2 g via INTRAVENOUS
  Filled 2018-12-21: qty 50

## 2018-12-21 MED ORDER — ALPRAZOLAM 0.25 MG PO TABS
0.2500 mg | ORAL_TABLET | Freq: Every day | ORAL | Status: DC | PRN
  Administered 2018-12-21 – 2018-12-22 (×2): 0.25 mg via ORAL
  Filled 2018-12-21 (×2): qty 1

## 2018-12-21 NOTE — ED Notes (Signed)
RN informed it's ok for Pt to receive a visitor

## 2018-12-21 NOTE — Telephone Encounter (Signed)
° °  BP 140/88 today  1. Are you currently SOB (can you hear that pt is SOB on the phone)? Yes   2. How long have you been experiencing SOB? 2 days  3. Are you SOB when sitting or when up moving around? Sitting and moving 4. Are you currently experiencing any other symptoms?  Dizziness, nausea, left arm numbness, weakness, back pain     1. Are you having CP right now? NO  2. Are you experiencing any other symptoms (ex. SOB, nausea, vomiting, sweating)? SOB, nausea  3. How long have you been experiencing CP? 1 day  4. Is your CP continuous or coming and going?  "quick episode"  5. Have you taken Nitroglycerin? NO ?

## 2018-12-21 NOTE — ED Provider Notes (Signed)
[facility] EMERGENCY DEPARTMENT Provider Note   CSN: [id number] Arrival date & time: 12/21/18  1812    History   Chief Complaint Chief Complaint  Patient presents with  . Weakness    HPI Lawrencia Mauney is a 73 y.o. female.     HPI  73 year old female presents with multiple complaints.  Chief issue tonight appears to be shortness of breath.  She is been feeling weak and having to use a walker that she has left over from a prior fall over the last 1 week or so.  She recently was started on Crestor and had to stop this.  She has been having body aches and some chills.  She had some dark urine earlier in the week.  She is becoming so weak it is very difficult to walk.  The weakness is generalized.  On and off headache in the occiput over the last couple days.  Tonight around 4 PM had a severe headache in the same area that is unrelenting.  No vomiting today but has been having vomiting on and off throughout the week.  This evening also developed shortness of breath which is remained and transiently had some left-sided chest and left-sided back pain.  That is gone now.  She has been feeling anxious and took 1 of her anxiolytics at home. She took aspirin prior to calling 911.  Past Medical History:  Diagnosis Date  . Arthritis   . Asthma   . Bicuspid aortic valve 05/2017   Likely functional bicuspid aortic valve with sclerosis and no stenosis.  . Bursitis   . Cataract    mild  . Dysthymia   . Hemorrhoids   . Hx of ulcerative colitis    per dr Arnoldo Morale as per pt.  . Hyperlipidemia   . Hypertension    managed - labile.  . Migraine   . Scoliosis   . 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 Spinal stenosis   . Thoracic aortic aneurysm (HCC)    Stable 4.2-4.3 cm (followed by Dr. Roxan Hockey)  . Thyroid disease     Patient Active Problem List     Diagnosis Date Noted  . Prolonged QT interval 12/21/2018  . Hyponatremia 12/21/2018  . Chest pain 12/21/2018  . Hyperlipidemia with target LDL less than 100 10/02/2018  . Narcotic dependence ([place]) 11/28/2017  . Encounter for chronic pain management 11/28/2017  . Pain management contract signed 11/28/2017  . Dyspnea on exertion 06/01/2017  . Aortic stenosis due to bicuspid aortic valve 05/27/2017  . Encounter for routine adult health examination with abnormal findings 07/09/2016  . Recurrent falls 03/28/2016  . Rosacea 08/18/2015  . Lumbar radiculopathy 07/07/2015  . Urinary incontinence 06/20/2015  . Hemorrhoids 10/06/2014  . Cough variant asthma vs UACS 08/03/2014  . Essential hypertension 02/22/2014  . Insomnia secondary to chronic pain 07/09/2012  . B12 deficiency 04/03/2011  . UNSPECIFIED VITAMIN D DEFICIENCY 07/25/2010  . ANEMIA, IRON DEFICIENCY 04/25/2009  . ALLERGIC RHINITIS 02/01/2009  . Aneurysm of thoracic aorta ([place]) 12/27/2008  . Chronic fatigue syndrome 04/21/2008  . 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 OSTEOARTHRITIS 01/19/2008  . Hypothyroidism 03/13/2007  . Asthma 03/13/2007  . Fibromyalgia 03/13/2007    Past Surgical History:  Procedure Laterality Date  . ABDOMINAL HYSTERECTOMY    . BUNIONECTOMY    .  Cardiac Event Monitor  07/2017   Overall relatively normal.  Normal sinus rhythm with rare bradycardia and tachycardia.  Heart rate ranged from 55-110 bpm.  Occasional PACs and PVCs, every single 1 was felt.  No arrhythmias other than one short 4 beat run of PACs.  . COLONOSCOPY  02-04-2005   all normal   . FOOT SURGERY     3 pins in toes   . HAND SURGERY     left thumb joint resection  . nasal revision    . NM MYOVIEW LTD  07/2017   LOW RISK study.  No ischemia or infarction.  EF greater than 65%.   [name] Kitchen SINUS IRRIGATION    . TONSILLECTOMY AND ADENOIDECTOMY    . TRANSTHORACIC ECHOCARDIOGRAM  06/'1, 8/'19   a) Moderate LVH. Normal EF  60-65%. Normal diastolic parameters. --> Difficult to fully visualize the aortic valve. Cannot exclude bicuspid valve. Mild aortic stenosis noted. No PFO. Mildly dilated left atrium. Trivial MR. No comment on mitral valve prolapse. Moderately dilated ascending aorta.;; b) F/u Echo To evaluate the aortic valve. -- Bicuspid AoV - mildly thickened / calcified. - No stenosis     OB History   No obstetric history on file.      Home Medications    Prior to Admission medications   Medication Sig Start Date End Date Taking? Authorizing Provider  albuterol (PROAIR HFA) 108 (90 Base) MCG/ACT inhaler INHALE 2 PUFFS INTO THE LUNGS EVERY 6 (SIX) HOURS AS NEEDED. Patient taking differently: Inhale 2 puffs into the lungs every 6 (six) hours as needed for shortness of breath.  10/09/18  Yes [name], MD  ALPRAZolam Duanne Moron) 0.25 MG tablet Take 1 tablet (0.25 mg total) by mouth daily as needed for anxiety. 11/11/18  Yes [name], MD  carisoprodol (SOMA) 350 MG tablet TAKE 1 TABLET(350 MG) BY MOUTH THREE TIMES DAILY AS NEEDED Patient taking differently: Take 350 mg by mouth 3 (three) times daily as needed for muscle spasms.  11/11/18  Yes [name], MD  cloNIDine (CATAPRES) 0.1 MG tablet TAKE 1 TABLET BY MOUTH THREE TIMES DAILY AS NEEDED. TAKE ONLY AS NEEDED FOR SYSTOLIC BLOOD PRESSURE ABOVE 160 Patient taking differently: Take 0.1 mg by mouth 3 (three) times daily. Take only as needed for systolic blood pressure above 01/19/18  Yes [name], MD  cycloSPORINE (RESTASIS) 0.05 % ophthalmic emulsion Place 1 drop into both eyes 2 (two) times daily.   Yes [provider]  Eszopiclone 3 MG TABS TAKE 1 TABLET BY MOUTH IMMEDIATELY BEFORE BEDTIME Patient taking differently: Take 3 mg by mouth at bedtime.  09/29/18  Yes [name], MD  famotidine (PEPCID) 20 MG tablet Take 20 mg by mouth at bedtime.   Yes [provider]  FLUoxetine (PROZAC) 40 MG  capsule TAKE 1 CAPSULE(40 MG) BY MOUTH TWICE DAILY Patient taking differently: Take 40 mg by mouth 2 (two) times daily.  08/31/18  Yes [name], MD  levothyroxine (SYNTHROID, LEVOTHROID) 125 MCG tablet TAKE 1 TABLET(125 MCG) BY MOUTH DAILY Patient taking differently: Take 125 mcg by mouth daily.  07/20/18  Yes [name], MD  MINIVELLE 0.05 MG/24HR patch Place 1 patch onto the skin 2 (two) times a week.  09/04/14  Yes [provider]  montelukast (SINGULAIR) 10 MG tablet TAKE 1 TABLET(10 MG) BY MOUTH AT BEDTIME Patient taking differently: Take 10 mg by mouth at bedtime.  12/15/18  Yes [name], MD  MYRBETRIQ 50 MG TB24 tablet TAKE 1 TABLET(50 MG) BY MOUTH DAILY Patient taking differently: Take 50 mg by mouth daily.  10/02/18  Yes [name], MD  nebivolol (BYSTOLIC) 10 MG tablet Take 1 tablet (10 mg total) by mouth daily. y 10/02/18  Yes [name], MD  spironolactone (ALDACTONE) 25 MG tablet TAKE 1 TABLET(25 MG) BY MOUTH DAILY Patient taking differently: Take 25 mg by mouth daily.  10/16/18  Yes [name], MD  valsartan (DIOVAN) 320 MG tablet TAKE 1 TABLET(320 MG) BY MOUTH EVERY MORNING Patient taking differently: Take 320 mg by mouth daily.  08/31/18  Yes [name], MD  verapamil (CALAN-SR) 180 MG CR tablet Take 1 tablet (180 mg total) by mouth at bedtime. 12/07/18  Yes [name], MD  Respiratory Therapy Supplies (FLUTTER) DEVI Use as directed 11/18/16   Tanda Rockers, MD  rosuvastatin (CRESTOR) 10 MG tablet Take 1 tablet 2x/week for 2 weeks, then increase to 3x/week for 2 weeks, then f/u with clinic Patient not taking: Reported on 12/21/2018 12/07/18   [name], MD    Family History Family History  Problem Relation Age of Onset  . Arthritis Mother   . Leukemia Father   . Heart disease Maternal Uncle   . Breast cancer Paternal Aunt   . Diabetes Paternal Aunt   . Breast cancer Maternal Grandmother   .  Heart disease Maternal Grandfather   . Breast cancer Paternal Grandmother   . Colon cancer Neg Hx     Social History Social History   Tobacco Use  . Smoking status: Never Smoker  . Smokeless tobacco: Never Used  Substance Use Topics  . Alcohol use: Yes    Alcohol/week: 0.0 standard drinks    Comment: occ   . Drug use: No     Allergies   Fentanyl; Amoxicillin; Hctz [hydrochlorothiazide]; Hydralazine; Codeine; Conjugated estrogens; Erythromycin; and Piroxicam   Review of Systems Review of Systems  Constitutional: Positive for chills.  Respiratory: Positive for shortness of breath.   Cardiovascular: Positive for chest pain.  Gastrointestinal: Positive for vomiting. Negative for abdominal pain.  Genitourinary: Negative for dysuria.  Musculoskeletal: Positive for back pain.  Neurological: Positive for weakness and headaches.  All other systems reviewed and are negative.    Physical Exam Updated Vital Signs BP 112/67   Pulse 62   Temp 97.8 F (36.6 C) (Oral)   Resp 19   Ht 5' 5"  (1.651 m)   Wt 62 kg   SpO2 100%   BMI 22.75 kg/m   Physical Exam Vitals signs and nursing note reviewed.  Constitutional:      General: She is not in acute distress.    Appearance: She is well-developed. She is not ill-appearing or diaphoretic.  HENT:     Head: Normocephalic and atraumatic.     Right Ear: External ear normal.     Left Ear: External ear normal.     Nose: Nose normal.  Eyes:     General:        Right eye: No discharge.        Left eye: No discharge.     Extraocular Movements: Extraocular movements intact.     Pupils: Pupils are equal, round, and reactive to light.  Cardiovascular:     Rate and Rhythm: Normal rate and regular rhythm.     Heart sounds: Normal heart sounds.  Pulmonary:     Effort: Pulmonary effort is normal. Tachypnea present.  Breath sounds: Normal breath sounds.  Chest:     Chest wall: No tenderness.  Abdominal:     General: There is no  distension.     Palpations: Abdomen is soft.     Tenderness: There is no abdominal tenderness.  Musculoskeletal:     Comments: No back tenderness  Skin:    General: Skin is warm and dry.  Neurological:     Mental Status: She is alert.     Comments: CN 3-12 grossly intact. 5/5 strength in all 4 extremities. Grossly normal sensation. Normal finger to nose.   Psychiatric:        Mood and Affect: Mood is anxious.      ED Treatments / Results  Labs (all labs ordered are listed, but only abnormal results are displayed) Labs Reviewed  COMPREHENSIVE METABOLIC PANEL - Abnormal; Notable for the following components:      Result Value   Sodium 120 (*)    Potassium 3.2 (*)    Chloride 87 (*)    CO2 17 (*)    Glucose, Bld 115 (*)    BUN 6 (*)    Creatinine, Ser 1.18 (*)    GFR calc non Af Amer 45 (*)    GFR calc Af Amer 53 (*)    Anion gap 16 (*)    All other components within normal limits  BRAIN NATRIURETIC PEPTIDE - Abnormal; Notable for the following components:   B Natriuretic Peptide 114.2 (*)    All other components within normal limits  CBC WITH DIFFERENTIAL/PLATELET - Abnormal; Notable for the following components:   RBC 3.81 (*)    HCT 34.3 (*)    MCHC 36.2 (*)    All other components within normal limits  URINALYSIS, ROUTINE W REFLEX MICROSCOPIC - Abnormal; Notable for the following components:   Ketones, ur 5 (*)    All other components within normal limits  MAGNESIUM - Abnormal; Notable for the following components:   Magnesium 1.1 (*)    All other components within normal limits  OSMOLALITY - Abnormal; Notable for the following components:   Osmolality 249 (*)    All other components within normal limits  OSMOLALITY, URINE - Abnormal; Notable for the following components:   Osmolality, Ur 149 (*)    All other components within normal limits  PHOSPHORUS - Abnormal; Notable for the following components:   Phosphorus 1.7 (*)    All other components within normal  limits  ACETAMINOPHEN LEVEL - Abnormal; Notable for the following components:   Acetaminophen (Tylenol), Serum <10 (*)    All other components within normal limits  BETA-HYDROXYBUTYRIC ACID - Abnormal; Notable for the following components:   Beta-Hydroxybutyric Acid 1.04 (*)    All other components within normal limits  CBG MONITORING, ED - Abnormal; Notable for the following components:   Glucose-Capillary 119 (*)    All other components within normal limits  POCT I-STAT EG7 - Abnormal; Notable for the following components:   pH, Ven 7.654 (*)    pCO2, Ven 15.7 (*)    pO2, Ven 19.0 (*)    Bicarbonate 17.4 (*)    TCO2 18 (*)    Sodium 118 (*)    Calcium, Ion 1.00 (*)    HCT 31.0 (*)    Hemoglobin 10.5 (*)    All other components within normal limits  TROPONIN I  CK  SODIUM, URINE, RANDOM  TSH  ETHANOL  SALICYLATE LEVEL  TROPONIN I  TROPONIN I  TROPONIN I  VITAMIN B1  BASIC METABOLIC PANEL  BASIC METABOLIC PANEL  MAGNESIUM  PHOSPHORUS  TSH  COMPREHENSIVE METABOLIC PANEL  CBC  D-DIMER, QUANTITATIVE (NOT AT [facility])    EKG EKG Interpretation  Date/Time:  Monday December 21 2018 19:17:43 EST Ventricular Rate:  64 PR Interval:    QRS Duration: 101 QT Interval:  524 QTC Calculation: 541 R Axis:   -37 Text Interpretation:  Sinus rhythm Left axis deviation Borderline ST depression, anterolateral leads Prolonged QT interval mild ST depressions new since 2013 Confirmed by Sherwood Gambler [phone]) on 12/21/2018 7:24:32 PM   Radiology Dg Chest 2 View  Result Date: 12/21/2018 CLINICAL DATA:  Chest pain, shortness of breath EXAM: CHEST - 2 VIEW COMPARISON:  10/31/2016 FINDINGS: Heart and mediastinal contours are within normal limits. No focal opacities or effusions. No acute bony abnormality. Old healed left seventh and 8th rib fractures. 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 IMPRESSION: No active cardiopulmonary disease. Electronically Signed    By: Rolm Baptise M.D.   On: 12/21/2018 19:12   Ct Head Wo Contrast  Result Date: 12/21/2018 CLINICAL DATA:  Headache EXAM: CT HEAD WITHOUT CONTRAST TECHNIQUE: Contiguous axial images were obtained from the base of the skull through the vertex without intravenous contrast. COMPARISON:  None. FINDINGS: Brain: No acute intracranial abnormality. Specifically, no hemorrhage, hydrocephalus, mass lesion, acute infarction, or significant intracranial injury. Vascular: No hyperdense vessel or unexpected calcification. Skull: No acute calvarial abnormality. Sinuses/Orbits: Visualized paranasal sinuses and mastoids clear. Orbital soft tissues unremarkable. Other: None IMPRESSION: No acute intracranial abnormality. Electronically Signed   By: Rolm Baptise M.D.   On: 12/21/2018 19:12    Procedures .Critical Care Performed by: Sherwood Gambler, MD Authorized by: Sherwood Gambler, MD   Critical care provider statement:    Critical care time (minutes):  30   Critical care time was exclusive of:  Separately billable procedures and treating other patients   Critical care was necessary to treat or prevent imminent or life-threatening deterioration of the following conditions:  Metabolic crisis   Critical care was time spent personally by me on the following activities:  Development of treatment plan with patient or surrogate, discussions with consultants, evaluation of patient's response to treatment, examination of patient, obtaining history from patient or surrogate, ordering and performing treatments and interventions, ordering and review of laboratory studies, ordering and review of radiographic studies, pulse oximetry, re-evaluation of patient's condition and review of old charts   (including critical care time)  Medications Ordered in ED Medications  potassium chloride 10 mEq in 100 mL IVPB (10 mEq Intravenous New Bag/Given 12/21/18 2234)  thiamine 518m in normal saline (583m IVPB (500 mg Intravenous New  Bag/Given 12/21/18 2236)  nebivolol (BYSTOLIC) tablet 10 mg (has no administration in time range)  ALPRAZolam (XDuanne Morontablet 0.25 mg (0.25 mg Oral Given 12/21/18 2236)  levothyroxine (SYNTHROID, LEVOTHROID) tablet 125 mcg (has no administration in time range)  0.9 %  sodium chloride infusion (75 mL/hr Intravenous New Bag/Given 12/21/18 2235)  feeding supplement (BOOST / RESOURCE BREEZE) liquid 1 Container ( Oral Given 12/21/18 2237)  lactated ringers bolus 1,000 mL (0 mLs Intravenous Stopped 12/21/18 2134)  potassium chloride SA (K-DUR,KLOR-CON) CR tablet 40 mEq (40 mEq Oral Given 12/21/18 1941)  magnesium sulfate IVPB 2 g 50 mL (0 g Intravenous Stopped 12/21/18 2134)     Initial Impression / Assessment and Plan / ED Course  I have reviewed the triage vital signs and the nursing notes.  Pertinent labs & imaging results that were available during my care of the patient were reviewed by me and considered in my medical decision making (see chart for details).        A lot of the patient's symptoms are probably related to the hyponatremia.  Given her poor intake and vomiting throughout this past week, IV fluid bolus given, after her sodium level returned, no further boluses will be given but she will need careful management of her sodium.  She is also hypokalemic and was given potassium as well as IV magnesium for the hypomagnesemia.  While she did have some shortness of breath, this appears to be more compensatory.  Chest pain was vague and has been gone and I think dissection or MI is unlikely.  She will need admission and careful titration of her sodium with work-up.  Dr. Roel Cluck to admit.  Final Clinical Impressions(s) / ED Diagnoses   Final diagnoses:  Hyponatremia  Hypomagnesemia    ED Discharge Orders    None       Sherwood Gambler, MD 12/21/18 2303

## 2018-12-21 NOTE — ED Notes (Signed)
Patient is hysterically crying; no reason stated. Provided with tissues and will contact the phys.

## 2018-12-21 NOTE — ED Notes (Signed)
Patient is profoundly anxious and tremulous. C/o migraine and photophobia. Lights dimmed and bed laid back for patient comfort.

## 2018-12-21 NOTE — ED Notes (Signed)
Patient transported to CT 

## 2018-12-21 NOTE — H&P (Signed)
Oval Linsey Mulroy GYJ:[id number] DOB: [birth year] DOA: 12/21/2018     PCP: [name], MD   Outpatient Specialists:   [facility] Dr. Ellyn Hack    Patient arrived to ER on 12/21/18 at 1812  Patient coming from: home Lives   With family    Chief Complaint:  Chief Complaint  Patient presents with  . Weakness    HPI: Khianna Blazina is a 73 y.o. 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 Difficult to obtain patient's history as she is in a rambling tearful and attacking her husband verbally throughout conversation. Husband states that she has not been eating well for quite some time only pecks a little bit here and there She has been preoccupied with constipation and has been taking multiple medications to help her have a bowel movement.. Denies any excessive aspirin acetaminophen intake Regarding pertinent Chronic problems: aortic aneurism by cardiology Has hx of chronic pain no longer on Demerol    While in ER: Na 120 Mg 1.1 K 3.2 The following Work up has been ordered so far:  Orders Placed This Encounter  Procedures  . CT Head Wo Contrast  . DG Chest 2 View  . Comprehensive metabolic panel  . Troponin I - Once  . Brain natriuretic peptide  . CBC  with Differential  . Urinalysis, Routine w reflex microscopic  . CK  . Magnesium  . Osmolality  . Osmolality, urine  . Sodium, urine, random  . Diet NPO time specified  . Cardiac monitoring  . Check temperature  . Consult to hospitalist  . Pulse oximetry, continuous  . CBG monitoring, ED  . EKG 12-Lead  . ED EKG  . EKG 12-Lead  . Repeat EKG  . EKG 12-Lead  . EKG 12-Lead  . Saline lock IV     Following Medications were ordered in ER: Medications  magnesium sulfate IVPB 2 g 50 mL (2 g Intravenous New Bag/Given 12/21/18 2036)  lactated ringers bolus 1,000 mL (1,000 mLs Intravenous New Bag/Given 12/21/18 1843)  potassium chloride SA (K-DUR,KLOR-CON) CR tablet 40 mEq (40 mEq Oral Given 12/21/18 1941)    Significant initial  Findings: Abnormal Labs Reviewed  COMPREHENSIVE METABOLIC PANEL - Abnormal; Notable for the following components:      Result Value   Sodium 120 (*)    Potassium 3.2 (*)    Chloride 87 (*)    CO2 17 (*)    Glucose, Bld 115 (*)    BUN 6 (*)    Creatinine, Ser 1.18 (*)    GFR calc non Af Amer 45 (*)    GFR calc Af Amer 53 (*)    Anion gap 16 (*)  All other components within normal limits  BRAIN NATRIURETIC PEPTIDE - Abnormal; Notable for the following components:   B Natriuretic Peptide 114.2 (*)    All other components within normal limits  CBC WITH DIFFERENTIAL/PLATELET - Abnormal; Notable for the following components:   RBC 3.81 (*)    HCT 34.3 (*)    MCHC 36.2 (*)    All other components within normal limits  URINALYSIS, ROUTINE W REFLEX MICROSCOPIC - Abnormal; Notable for the following components:   Ketones, ur 5 (*)    All other components within normal limits  MAGNESIUM - Abnormal; Notable for the following components:   Magnesium 1.1 (*)    All other components within normal limits  OSMOLALITY - Abnormal; Notable for the following components:   Osmolality 249 (*)    All other components within normal limits  CBG MONITORING, ED -  Abnormal; Notable for the following components:   Glucose-Capillary 119 (*)    All other components within normal limits     Lactic Acid, Venous No results found for: LATICACIDVEN  Na 120 K 3.2 Mg 1.1  Cr   stable,    Lab Results  Component Value Date   CREATININE 1.18 (H) 12/21/2018   CREATININE 0.81 12/01/2018   CREATININE 0.83 08/04/2018      WBC  6.1  HG/HCT  stable,       Component Value Date/Time   HGB 12.4 12/21/2018 1838   HCT 34.3 (L) 12/21/2018 1838     CK 100  Troponin (Point of Care Test) No results for input(s): TROPIPOC in the last 72 hours.   BNP (last 3 results) Recent Labs    12/21/18 1838  BNP 114.2*    ProBNP (last 3 results) No results for input(s): PROBNP in the last 8760 hours.     UA  no evidence of UTI     CT HEAD   NON acute  CXR -  NON acute   ECG:  Personally reviewed by me showing: HR : 64 Rhythm:  NSR,   no evidence of ischemic changes QTC 541     ED Triage Vitals  Enc Vitals Group     BP 12/21/18 1824 (!) 181/117     Pulse Rate 12/21/18 1824 67     Resp 12/21/18 1824 (!) 23     Temp 12/21/18 1915 97.8 F (36.6 C)     Temp Source 12/21/18 1915 Oral     SpO2 12/21/18 1824 100 %     Weight 12/21/18 1824 136 lb 11 oz (62 kg)     Height 12/21/18 1824 5' 5"  (1.651 m)     Head Circumference --      Peak Flow --      Pain Score --      Pain Loc --      Pain Edu? --      Excl. in [place]? --   TMAX(24)@       Latest  Blood pressure (!) 155/69, pulse 64, temperature 97.8 F (36.6 C), temperature source Oral, resp. rate 19, height 5' 5"  (1.651 m), weight 62 kg, SpO2 100 %.    Hospitalist was called for admission for hyponatremia and multiple abnormal elctrolytes   Review of Systems:    Pertinent positives include: fatigue, weight loss constipation difficulty with ambulation nausea, vomiting, diarrhea,  Constitutional:  No weight loss, night sweats, Fevers, chills,  HEENT:  No headaches, Difficulty  swallowing,Tooth/dental problems,Sore throat,  No sneezing, itching, ear ache, nasal congestion, post  nasal drip,  Cardio-vascular:  No chest pain, Orthopnea, PND, anasarca, dizziness, palpitations.no Bilateral lower extremity swelling  GI:  No heartburn, indigestion, abdominal pain,  change in bowel habits, loss of appetite, melena, blood in stool, hematemesis Resp:  no shortness of breath at rest. No dyspnea on exertion, No excess mucus, no productive cough, No non-productive cough, No coughing up of blood.No change in color of mucus.No wheezing. Skin:  no rash or lesions. No jaundice GU:  no dysuria, change in color of urine, no urgency or frequency. No straining to urinate.  No flank pain.  Musculoskeletal:  No joint pain or no joint swelling. No decreased range of motion. No back pain.  Psych:  No change in mood or affect. No depression or anxiety. No memory loss.  Neuro: no localizing neurological complaints, no tingling, no weakness, no double vision, no gait abnormality, no slurred speech, no confusion  All systems reviewed and apart from [place] all are negative  Past Medical History:   Past Medical History:  Diagnosis Date  . Arthritis   . Asthma   . Bicuspid aortic valve 05/2017   Likely functional bicuspid aortic valve with sclerosis and no stenosis.  . Bursitis   . Cataract    mild  . Dysthymia   . Hemorrhoids   . Hx of ulcerative colitis    per dr Arnoldo Morale as per pt.  . Hyperlipidemia   . Hypertension    managed - labile.  . Migraine   . Scoliosis   . 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 Spinal stenosis   . Thoracic aortic aneurysm (HCC)    Stable 4.2-4.3 cm (followed by Dr. Roxan Hockey)  . Thyroid disease        Past Surgical History:  Procedure Laterality Date  . ABDOMINAL HYSTERECTOMY    . BUNIONECTOMY    . Cardiac Event Monitor   07/2017   Overall relatively normal.  Normal sinus rhythm with rare bradycardia and tachycardia.  Heart rate ranged from 55-110 bpm.  Occasional PACs and PVCs, every single 1 was felt.  No arrhythmias other than one short 4 beat run of PACs.  . COLONOSCOPY  02-04-2005   all normal   . FOOT SURGERY     3 pins in toes   . HAND SURGERY     left thumb joint resection  . nasal revision    . NM MYOVIEW LTD  07/2017   LOW RISK study.  No ischemia or infarction.  EF greater than 65%.   [name] Kitchen SINUS IRRIGATION    . TONSILLECTOMY AND ADENOIDECTOMY    . TRANSTHORACIC ECHOCARDIOGRAM  06/'1, 8/'19   a) Moderate LVH. Normal EF 60-65%. Normal diastolic parameters. --> Difficult to fully visualize the aortic valve. Cannot exclude bicuspid valve. Mild aortic stenosis noted. No PFO. Mildly dilated left atrium. Trivial MR. No comment on mitral valve prolapse. Moderately dilated ascending aorta.;; b) F/u Echo To evaluate the aortic valve. -- Bicuspid AoV - mildly thickened / calcified. - No stenosis    Social History:  Ambulatory   independently    reports that she has never smoked. She has never used smokeless tobacco. She reports current alcohol use. She reports that she does not use drugs.     Family History:   Family History  Problem Relation Age of Onset  . Arthritis Mother   .  Leukemia Father   . Heart disease Maternal Uncle   . Breast cancer Paternal Aunt   . Diabetes Paternal Aunt   . Breast cancer Maternal Grandmother   . Heart disease Maternal Grandfather   . Breast cancer Paternal Grandmother   . Colon cancer Neg Hx     Allergies: Allergies  Allergen Reactions  . Fentanyl Shortness Of Breath and Rash  . Amoxicillin Diarrhea    Severe diarrhea, rash to vaginal area with swelling   . Hctz [Hydrochlorothiazide] Other (See Comments)    HypoNatremia  . Hydralazine     Weak, sweats, red skin  . Codeine Other (See Comments)    makes her hyper  . Conjugated Estrogens Itching and Rash  .  Erythromycin Rash    had a rash with emycin, has done ok with other meds in it's class  . Piroxicam Itching and Rash    Feldene      Prior to Admission medications   Medication Sig Start Date End Date Taking? Authorizing Provider  albuterol (PROAIR HFA) 108 (90 Base) MCG/ACT inhaler INHALE 2 PUFFS INTO THE LUNGS EVERY 6 (SIX) HOURS AS NEEDED. 10/09/18   [name], MD  ALPRAZolam Duanne Moron) 0.25 MG tablet Take 1 tablet (0.25 mg total) by mouth daily as needed for anxiety. 11/11/18   [name], MD  BELBUCA 75 MCG FILM Take 1 patch by mouth as directed. 09/30/18   [provider]  carisoprodol (SOMA) 350 MG tablet TAKE 1 TABLET(350 MG) BY MOUTH THREE TIMES DAILY AS NEEDED 11/11/18   [name], MD  cloNIDine (CATAPRES) 0.1 MG tablet TAKE 1 TABLET BY MOUTH THREE TIMES DAILY AS NEEDED. TAKE ONLY AS NEEDED FOR SYSTOLIC BLOOD PRESSURE ABOVE 160 01/19/18   [name], MD  cycloSPORINE (RESTASIS) 0.05 % ophthalmic emulsion Place 1 drop into both eyes 2 (two) times daily.    [provider]  Eszopiclone 3 MG TABS TAKE 1 TABLET BY MOUTH IMMEDIATELY BEFORE BEDTIME 09/29/18   [name], MD  famotidine (PEPCID) 20 MG tablet Take 20 mg by mouth at bedtime.    [provider]  FLUoxetine (PROZAC) 40 MG capsule TAKE 1 CAPSULE(40 MG) BY MOUTH TWICE DAILY 08/31/18   [name], MD  levothyroxine (SYNTHROID, LEVOTHROID) 125 MCG tablet TAKE 1 TABLET(125 MCG) BY MOUTH DAILY 07/20/18   [name], MD  MINIVELLE 0.05 MG/24HR patch Place 1 patch onto the skin 2 (two) times a week.  09/04/14   [provider]  montelukast (SINGULAIR) 10 MG tablet TAKE 1 TABLET(10 MG) BY MOUTH AT BEDTIME 12/15/18   [name], MD  MYRBETRIQ 50 MG TB24 tablet TAKE 1 TABLET(50 MG) BY MOUTH DAILY 10/02/18   [name], MD  nebivolol (BYSTOLIC) 10 MG tablet Take 1 tablet (10 mg total) by mouth daily. y 10/02/18   [name], MD  omeprazole (PRILOSEC) 20 MG capsule Take 20 mg by mouth as needed.    [provider]  Respiratory Therapy Supplies (FLUTTER) DEVI Use as directed 11/18/16   Tanda Rockers, MD  rosuvastatin (CRESTOR) 10 MG tablet Take 1 tablet 2x/week for 2 weeks, then increase to 3x/week for 2 weeks, then f/u with clinic 12/07/18   [name], MD  spironolactone (ALDACTONE) 25 MG tablet TAKE 1 TABLET(25 MG) BY MOUTH DAILY 10/16/18   [name], MD  valsartan (DIOVAN) 320 MG tablet TAKE 1 TABLET(320 MG) BY MOUTH EVERY MORNING 08/31/18  [name], MD  verapamil (CALAN-SR) 180 MG CR tablet Take 1 tablet (180 mg total) by mouth at bedtime. 12/07/18   [name], MD   Physical Exam: Blood pressure (!) 155/69, pulse 64, temperature 97.8 F (36.6 C), temperature source Oral, resp. rate 19, height 5' 5"  (1.651 m), weight 62 kg, SpO2 100 %. 1. General:  In Acute distress agitated rapidly breathing   Chronically ill but currently acutely ill -appearing 2. Psychological: Alert and  Oriented 3. Head/ENT:     Dry Mucous Membranes                          Head Non traumatic, neck supple                           Poor Dentition 4. SKIN:   decreased Skin turgor,  Skin clean Dry and intact no rash 5. Heart: Regular rate and rhythm no  Murmur, no Rub or gallop 6. Lungs:   no wheezes or crackles   7. Abdomen: Soft,  non-tender, Non distended  Obese bowel sounds present 8. Lower extremities: no clubbing, cyanosis, no  edema 9. Neurologically Grossly intact, moving all 4 extremities equally 10. MSK: Normal range of motion   LABS:     Recent Labs  Lab 12/21/18 1838  WBC 6.1  NEUTROABS 4.3  HGB 12.4  HCT 34.3*  MCV 90.0  PLT 814   Basic Metabolic Panel: Recent Labs  Lab 12/21/18 1838 12/21/18 1927  NA 120*  --   K 3.2*  --   CL 87*  --   CO2 17*  --   GLUCOSE 115*  --   BUN 6*  --   CREATININE 1.18*  --   CALCIUM 9.1  --   MG  --  1.1*      Recent Labs    Lab 12/21/18 1838  AST 34  ALT 33  ALKPHOS 38  BILITOT 0.9  PROT 6.5  ALBUMIN 4.2   No results for input(s): LIPASE, AMYLASE in the last 168 hours. No results for input(s): AMMONIA in the last 168 hours.    HbA1C: No results for input(s): HGBA1C in the last 72 hours. CBG: Recent Labs  Lab 12/21/18 1838  GLUCAP 119*      Urine analysis:    Component Value Date/Time   COLORURINE YELLOW 12/21/2018 1926   APPEARANCEUR CLEAR 12/21/2018 1926   LABSPEC 1.006 12/21/2018 1926   PHURINE 6.0 12/21/2018 [place] NEGATIVE 12/21/2018 [place] NEGATIVE 12/21/2018 [place] NEGATIVE 12/21/2018 1926   BILIRUBINUR 1+ 12/17/2011 1704   KETONESUR 5 (A) 12/21/2018 1926   PROTEINUR NEGATIVE 12/21/2018 1926   UROBILINOGEN 0.2 12/17/2011 1704   NITRITE NEGATIVE 12/21/2018 1926   LEUKOCYTESUR NEGATIVE 12/21/2018 1926       Cultures: No results found for: SDES, [place], CULT, REPTSTATUS   Radiological Exams on Admission: Dg Chest 2 View  Result Date: 12/21/2018 CLINICAL DATA:  Chest pain, shortness of breath EXAM: CHEST - 2 VIEW COMPARISON:  10/31/2016 FINDINGS: Heart and mediastinal contours are within normal limits. No focal opacities or effusions. No acute bony abnormality. Old healed left seventh and 8th rib fractures. 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 IMPRESSION: No active cardiopulmonary disease. Electronically Signed   By: Rolm Baptise M.D.   On: 12/21/2018 19:12   Ct Head Wo  Contrast  Result Date: 12/21/2018 CLINICAL DATA:  Headache EXAM: CT HEAD WITHOUT CONTRAST TECHNIQUE: Contiguous axial images were obtained from the base of the skull through the vertex without intravenous contrast. COMPARISON:  None. FINDINGS: Brain: No acute intracranial abnormality. Specifically, no hemorrhage, hydrocephalus, mass lesion, acute infarction, or significant intracranial injury. Vascular: No hyperdense vessel or unexpected  calcification. Skull: No acute calvarial abnormality. Sinuses/Orbits: Visualized paranasal sinuses and mastoids clear. Orbital soft tissues unremarkable. Other: None IMPRESSION: No acute intracranial abnormality. Electronically Signed   By: Rolm Baptise M.D.   On: 12/21/2018 19:12    Chart has been reviewed   Assessment/Plan   73 y.o. 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 Acute metabolic encephalopathy -   - most likely multifactorial secondary to combination of severe dehydration secondary to decreased by mouth intake, polypharmacy multiple electrolyte abnormalities  - Will rehydrate     - Hold contributing medications   - if no improvement may need further imaging to evaluate for CNS pathology pathology such as MRI of the brain   - neurological exam appears to be nonfocal but patient unable to cooperate fully   - VBG  no evidence of hypercarbia    - no history of liver disease   . Essential hypertension  -restart medications cautiously been watch for any signs of hypotension  . Hyperlipidemia with target LDL less than 100 -patient did not tolerate Crestor states that her symptoms started with initiation of Crestor CK within normal limits . Hypothyroidism -TSH within normal limits continue Synthroid . Asthma -stable albuterol as needed . Prolonged QT interval - - will monitor on tele avoid QT prolonging medications, rehydrate correct electrolytes  . Hyponatremia - most likely secondary to hypovolemic hyponatremia in the setting of decreased p.o. intake nausea vomiting diarrhea episodes.  We have also frequent use of laxatives.  We will rehydrate gently and follow if difficult to manage would obtain official nephrology consult .  Chest pain -somewhat atypical rate enzymes monitor on telemetry . Aneurysm of thoracic aorta ([place]) we will need to follow-up with cardiology pain described none typical for dissection currently resolved  . ANEMIA, IRON DEFICIENCY stable continue to monitor . B12 deficiency check B12 levels . Malnutrition ([place]) order nutritional consult check prealbumin . Hypokalemia -will replace . Hypomagnesemia -we will replace  . Hypophosphatemia -we will replace and continue to follow suspect secondary to poor p.o. intake  . Alkalosis, metabolic likely contraction metabolic alkalosis worsened by respiratory alkalosis with hyperventilation . Alcohol abuse -husband states patient has not been drinking as far as he can see for the past few weeks but cannot rule out withdrawals will monitor for any evidence of withdrawal symptoms and order CIWA protocol  Decreased p.o. intake with multiple electrolyte abnormalities and history of use of laxatives unclear if underlying eating disorder would benefit from psychiatry evaluation ordered  Other plan as per orders.  DVT prophylaxis:  SCD      Code Status:  FULL CODE  as per patient  I had personally discussed CODE STATUS with patient   Family Communication:   Family   at  Bedside  plan of care was discussed with  Husband,  Disposition Plan:      To home once workup is complete and patient is stable  Would benefit from PT/OT eval prior to DC  Ordered                                   Nutrition    consulted                       Behavioral health  consulted                    Consults called: Discussed with nephrology if labs continue to be significantly abnormal tomorrow will need further consultation otherwise continue to manage with IV fluids  Admission status:   inpatient     Expect 2 midnight stay secondary to severity of patient's current illness including   hemodynamic instability despite optimal treatment (tachycardia   Tachypnea  )  Severe lab/radiological/exam abnormalities including: Natremia hypomagnesemia hypophosphatemia alkalosis   and extensive comorbidities including:  Chronic pain .    That are currently affecting medical management.   I expect  patient to be hospitalized for 2 midnights requiring inpatient medical care.  Patient is at high risk for adverse outcome (such as loss of life or disability) if not treated.  Indication for inpatient stay as follows:  Severe change from baseline regarding mental status Hemodynamic instability despite maximal medical therapy,      inability to maintain oral hydration   persistent chest pain despite medical management    Need for  , IV fluids,       Level of care  SDU tele indefinitely please discontinue once patient no longer qualifies    Malissa Slay 12/21/2018, 9:47 PM    [facility]     after 2 AM please page floor coverage PA If 7AM-7PM, please contact the day team taking care of the patient using [email or web address]

## 2018-12-21 NOTE — ED Triage Notes (Signed)
Pt states generalized weakness since Tuesday and L sided chest pain today.  Took 325 mg asa at 1715, which removed chest pain.  Initially pt was cool to touch and pale, per [facility].  18g R ac.  cbg 110. nsr on monitor, per [facility].  169/79, hr 65 nsr, 100% ra. Orthostatics performed at pt home and pt was not orthostatic.

## 2018-12-21 NOTE — Telephone Encounter (Signed)
Spoke with pt who states that Crestor made her feel sick and caused muscle pain and weakness to her legs so much so that she was unable to stand without assistance from her husband or walker. She states that she took her last Crestor tablet on 2/17 and since d/c of med she has had periodic vomiting, constipation, and diarrhea. She states that she feels 'very weak' and 'very faint' and does c/o dizziness and lightheadedness. Pt audibly SOB over the phone. PharmD Brion Aliment, [facility] consulted. Advised pt that Crestor may cause muscle pain/soreness, but her other symptoms may be d/t contracting a stomach virus. Advised pt to try BRAT diet and informed pt that pharmD would contact pt on 2/25 or 2/26 to discuss medication change. Also advised pt to report to ED if SOB continues and/or worsens. Pt states that she will report to ED today and does not want call from pharmD.

## 2018-12-22 ENCOUNTER — Inpatient Hospital Stay (HOSPITAL_COMMUNITY): Payer: Medicare Other

## 2018-12-22 DIAGNOSIS — R079 Chest pain, unspecified: Secondary | ICD-10-CM

## 2018-12-22 DIAGNOSIS — F1010 Alcohol abuse, uncomplicated: Secondary | ICD-10-CM | POA: Diagnosis present

## 2018-12-22 DIAGNOSIS — E46 Unspecified protein-calorie malnutrition: Secondary | ICD-10-CM | POA: Diagnosis present

## 2018-12-22 DIAGNOSIS — G9341 Metabolic encephalopathy: Secondary | ICD-10-CM | POA: Diagnosis present

## 2018-12-22 DIAGNOSIS — F419 Anxiety disorder, unspecified: Secondary | ICD-10-CM

## 2018-12-22 DIAGNOSIS — E876 Hypokalemia: Secondary | ICD-10-CM | POA: Diagnosis present

## 2018-12-22 DIAGNOSIS — E440 Moderate protein-calorie malnutrition: Secondary | ICD-10-CM

## 2018-12-22 DIAGNOSIS — E873 Alkalosis: Secondary | ICD-10-CM | POA: Diagnosis present

## 2018-12-22 DIAGNOSIS — E43 Unspecified severe protein-calorie malnutrition: Secondary | ICD-10-CM | POA: Diagnosis present

## 2018-12-22 LAB — COMPREHENSIVE METABOLIC PANEL
ALBUMIN: 3.8 g/dL (ref 3.5–5.0)
ALT: 28 U/L (ref 0–44)
ALT: 26 U/L (ref 0–44)
ANION GAP: 16 — AB (ref 5–15)
AST: 29 U/L (ref 15–41)
AST: 31 U/L (ref 15–41)
Albumin: 3.9 g/dL (ref 3.5–5.0)
Alkaline Phosphatase: 37 U/L — ABNORMAL LOW (ref 38–126)
Alkaline Phosphatase: 37 U/L — ABNORMAL LOW (ref 38–126)
Anion gap: 15 (ref 5–15)
BUN: 5 mg/dL — AB (ref 8–23)
BUN: 7 mg/dL — ABNORMAL LOW (ref 8–23)
CO2: 15 mmol/L — ABNORMAL LOW (ref 22–32)
CO2: 13 mmol/L — ABNORMAL LOW (ref 22–32)
CREATININE: 1.02 mg/dL — AB (ref 0.44–1.00)
Calcium: 8.6 mg/dL — ABNORMAL LOW (ref 8.9–10.3)
Calcium: 8.4 mg/dL — ABNORMAL LOW (ref 8.9–10.3)
Chloride: 93 mmol/L — ABNORMAL LOW (ref 98–111)
Chloride: 91 mmol/L — ABNORMAL LOW (ref 98–111)
Creatinine, Ser: 0.95 mg/dL (ref 0.44–1.00)
GFR calc Af Amer: >60 mL/min (ref >60)
GFR calc Af Amer: >60 mL/min (ref >60)
GFR calc non Af Amer: 54 mL/min — ABNORMAL LOW (ref >60)
GFR calc non Af Amer: 59 mL/min — ABNORMAL LOW (ref >60)
Glucose, Bld: 95 mg/dL (ref 70–99)
Glucose, Bld: 99 mg/dL (ref 70–99)
Potassium: 4.4 mmol/L (ref 3.5–5.1)
Potassium: 3.4 mmol/L — ABNORMAL LOW (ref 3.5–5.1)
Sodium: 123 mmol/L — ABNORMAL LOW (ref 135–145)
Sodium: 120 mmol/L — ABNORMAL LOW (ref 135–145)
Total Bilirubin: 0.9 mg/dL (ref 0.3–1.2)
Total Bilirubin: 0.5 mg/dL (ref 0.3–1.2)
Total Protein: 5.7 g/dL — ABNORMAL LOW (ref 6.5–8.1)
Total Protein: 5.9 g/dL — ABNORMAL LOW (ref 6.5–8.1)

## 2018-12-22 LAB — TROPONIN I
Troponin I: <0.03 ng/mL (ref <0.03)
Troponin I: <0.03 ng/mL (ref <0.03)

## 2018-12-22 LAB — CBC
HCT: 31.7 % — ABNORMAL LOW (ref 36.0–46.0)
Hemoglobin: 11.4 g/dL — ABNORMAL LOW (ref 12.0–15.0)
MCH: 33.1 pg (ref 26.0–34.0)
MCHC: 36 g/dL (ref 30.0–36.0)
MCV: 92.2 fL (ref 80.0–100.0)
Platelets: 177 10*3/uL (ref 150–400)
RBC: 3.44 MIL/uL — ABNORMAL LOW (ref 3.87–5.11)
RDW: 12.1 % (ref 11.5–15.5)
WBC: 6.8 10*3/uL (ref 4.0–10.5)
nRBC: 0 % (ref 0.0–0.2)

## 2018-12-22 LAB — AMMONIA: Ammonia: 20 umol/L (ref 9–35)

## 2018-12-22 LAB — BASIC METABOLIC PANEL
ANION GAP: 9 (ref 5–15)
Anion gap: 12 (ref 5–15)
Anion gap: 13 (ref 5–15)
BUN: 7 mg/dL — ABNORMAL LOW (ref 8–23)
BUN: 6 mg/dL — ABNORMAL LOW (ref 8–23)
CO2: 16 mmol/L — ABNORMAL LOW (ref 22–32)
CO2: 16 mmol/L — ABNORMAL LOW (ref 22–32)
CO2: 17 mmol/L — ABNORMAL LOW (ref 22–32)
CREATININE: 1.03 mg/dL — AB (ref 0.44–1.00)
Calcium: 8.7 mg/dL — ABNORMAL LOW (ref 8.9–10.3)
Calcium: 8.5 mg/dL — ABNORMAL LOW (ref 8.9–10.3)
Calcium: 8.5 mg/dL — ABNORMAL LOW (ref 8.9–10.3)
Chloride: 93 mmol/L — ABNORMAL LOW (ref 98–111)
Chloride: 93 mmol/L — ABNORMAL LOW (ref 98–111)
Chloride: 100 mmol/L (ref 98–111)
Creatinine, Ser: 0.93 mg/dL (ref 0.44–1.00)
Creatinine, Ser: 0.89 mg/dL (ref 0.44–1.00)
GFR calc Af Amer: >60 mL/min (ref >60)
GFR calc Af Amer: >60 mL/min (ref >60)
GFR calc non Af Amer: 54 mL/min — ABNORMAL LOW (ref >60)
GFR calc non Af Amer: >60 mL/min (ref >60)
GFR calc non Af Amer: >60 mL/min (ref >60)
Glucose, Bld: 162 mg/dL — ABNORMAL HIGH (ref 70–99)
Glucose, Bld: 103 mg/dL — ABNORMAL HIGH (ref 70–99)
Glucose, Bld: 109 mg/dL — ABNORMAL HIGH (ref 70–99)
POTASSIUM: 3.9 mmol/L (ref 3.5–5.1)
Potassium: 4.4 mmol/L (ref 3.5–5.1)
Potassium: 3.7 mmol/L (ref 3.5–5.1)
SODIUM: 122 mmol/L — AB (ref 135–145)
Sodium: 121 mmol/L — ABNORMAL LOW (ref 135–145)
Sodium: 126 mmol/L — ABNORMAL LOW (ref 135–145)

## 2018-12-22 LAB — VITAMIN B12: Vitamin B-12: 1358 pg/mL — ABNORMAL HIGH (ref 180–914)

## 2018-12-22 LAB — PHOSPHORUS: Phosphorus: 1.4 mg/dL — ABNORMAL LOW (ref 2.5–4.6)

## 2018-12-22 LAB — MAGNESIUM: Magnesium: 2.3 mg/dL (ref 1.7–2.4)

## 2018-12-22 LAB — ECHOCARDIOGRAM COMPLETE
Height: 65 in
Weight: 2186.96 oz

## 2018-12-22 LAB — D-DIMER, QUANTITATIVE: D-Dimer, Quant: 0.54 ug/mL-FEU — ABNORMAL HIGH (ref 0.00–0.50)

## 2018-12-22 LAB — TSH: TSH: 0.813 u[IU]/mL (ref 0.350–4.500)

## 2018-12-22 LAB — PREALBUMIN: Prealbumin: 22.8 mg/dL (ref 18–38)

## 2018-12-22 MED ORDER — SODIUM CHLORIDE 0.9 % IV SOLN
INTRAVENOUS | Status: DC
  Administered 2018-12-22 – 2018-12-23 (×3): via INTRAVENOUS

## 2018-12-22 MED ORDER — ADULT MULTIVITAMIN W/MINERALS CH
1.0000 | ORAL_TABLET | Freq: Every day | ORAL | Status: DC
  Administered 2018-12-22 – 2018-12-24 (×3): 1 via ORAL
  Filled 2018-12-22 (×3): qty 1

## 2018-12-22 MED ORDER — CYCLOSPORINE 0.05 % OP EMUL
1.0000 [drp] | Freq: Two times a day (BID) | OPHTHALMIC | Status: DC
  Administered 2018-12-22 – 2018-12-24 (×4): 1 [drp] via OPHTHALMIC
  Filled 2018-12-22 (×4): qty 30

## 2018-12-22 MED ORDER — CARISOPRODOL 350 MG PO TABS
350.0000 mg | ORAL_TABLET | Freq: Three times a day (TID) | ORAL | Status: DC | PRN
  Administered 2018-12-22 – 2018-12-23 (×3): 350 mg via ORAL
  Filled 2018-12-22 (×3): qty 1

## 2018-12-22 MED ORDER — K PHOS MONO-SOD PHOS DI & MONO 155-852-130 MG PO TABS
500.0000 mg | ORAL_TABLET | Freq: Two times a day (BID) | ORAL | Status: DC
  Administered 2018-12-22 – 2018-12-24 (×6): 500 mg via ORAL
  Filled 2018-12-22 (×6): qty 2

## 2018-12-22 MED ORDER — VERAPAMIL HCL ER 180 MG PO TBCR
180.0000 mg | EXTENDED_RELEASE_TABLET | Freq: Every day | ORAL | Status: DC
  Administered 2018-12-22 – 2018-12-23 (×2): 180 mg via ORAL
  Filled 2018-12-22 (×2): qty 1

## 2018-12-22 MED ORDER — ENOXAPARIN SODIUM 40 MG/0.4ML ~~LOC~~ SOLN
40.0000 mg | SUBCUTANEOUS | Status: DC
  Administered 2018-12-22 – 2018-12-23 (×2): 40 mg via SUBCUTANEOUS
  Filled 2018-12-22 (×2): qty 0.4

## 2018-12-22 MED ORDER — ESTRADIOL 0.05 MG/24HR TD PTWK
0.0500 mg | MEDICATED_PATCH | TRANSDERMAL | Status: DC
  Administered 2018-12-23: 0.05 mg via TRANSDERMAL
  Filled 2018-12-22: qty 1

## 2018-12-22 MED ORDER — ZOLPIDEM TARTRATE 5 MG PO TABS
5.0000 mg | ORAL_TABLET | Freq: Every evening | ORAL | Status: DC | PRN
  Administered 2018-12-22 – 2018-12-23 (×2): 5 mg via ORAL
  Filled 2018-12-22 (×2): qty 1

## 2018-12-22 MED ORDER — SODIUM CHLORIDE 0.9 % IV SOLN
INTRAVENOUS | Status: DC | PRN
  Administered 2018-12-22: 250 mL via INTRAVENOUS

## 2018-12-22 MED ORDER — LORAZEPAM 2 MG/ML IJ SOLN: 2.0000 mg | INTRAMUSCULAR | Status: DC | PRN

## 2018-12-22 NOTE — Progress Notes (Signed)
Physical Therapy Evaluation Patient Details Name: Marissa [name] MRN: [id number] DOB: 1946/02/02 Today's Date: 12/22/2018   History of Present Illness  Patient is 73 y/o female presenting to hospital with generalized weakness and SOB. Patient with acute metabolic encephalopathy and hyponatremia likely due to deydration and polypharamacy, and electrolyte abnormalities. PMH includes HTN, anemia, TAA, asthma, and HLD.   Clinical Impression  Patient admitted to hospital secondary to problems above and with deficits below. Patient required min guard to stand and ambulate with use of RW. Patient with noticeable fatigue and SOB following functional mobility. VSS. Patient very tangential throughout session and required cues to stay on task. Given functional mobility and cognitive deficits, recommending SNF level therapy at this time. Patient may be able to progress to HHPT with 24/7 supervision if mobility and cognitive status improve. Patient will benefit from acute physical therapy to maximize independence and safety with functional mobility.     Follow Up Recommendations SNF;Supervision/Assistance - 24 hour    Equipment Recommendations  Rolling walker with 5" wheels    Recommendations for Other Services       Precautions / Restrictions Precautions Precautions: Fall Precaution Comments: Patient reports multiple syncope episodes at home Restrictions Weight Bearing Restrictions: No      Mobility  Bed Mobility Overal bed mobility: Needs Assistance Bed Mobility: Supine to Sit;Sit to Supine     Supine to sit: Supervision Sit to supine: Supervision   General bed mobility comments: Patient required supervision for all bed mobility for safety.   Transfers Overall transfer level: Needs assistance Equipment used: Rolling walker (2 wheeled) Transfers: Sit to/from Stand Sit to Stand: Min guard         General transfer comment: Patient required min guard to stand with use of RW for  safety. Verbal cues to not push up from RW however patient ignored cues.   Ambulation/Gait Ambulation/Gait assistance: Min guard Gait Distance (Feet): 75 Feet Assistive device: Rolling walker (2 wheeled) Gait Pattern/deviations: Step-through pattern;Decreased step length - right;Decreased step length - left;Decreased stride length Gait velocity: decreased Gait velocity interpretation: <1.8 ft/sec, indicate of risk for recurrent falls General Gait Details: Patient required min guard to ambulate with use of RW for safety. Verbal cues for sequencing with RW. Patient with noticable SOB and fatigue following ambulation. VSS.  Stairs            Wheelchair Mobility    Modified Mangan (Stroke Patients Only)       Balance Overall balance assessment: Needs assistance Sitting-balance support: Feet supported;No upper extremity supported Sitting balance-Leahy Scale: Good     Standing balance support: Bilateral upper extremity supported Standing balance-Leahy Scale: Poor Standing balance comment: reliant on BUE support to maintain standing balance                             Pertinent Vitals/Pain Pain Assessment: Faces Faces Pain Scale: No hurt    Home Living Family/patient expects to be discharged to:: Private residence Living Arrangements: Spouse/significant other Available Help at Discharge: Family;Available PRN/intermittently Type of Home: House Home Access: Stairs to enter Entrance Stairs-Rails: None Entrance Stairs-Number of Steps: 3 Home Layout: Two level;Able to live on main level with bedroom/bathroom Home Equipment: Walker - 4 wheels;Bedside commode      Prior Function Level of Independence: Needs assistance   Gait / Transfers Assistance Needed: Reports she has been using rollator for mobility last 2 weeks  ADL's / Homemaking Assistance Needed: States  husband has assisted with showering the last week        Hand Dominance         Extremity/Trunk Assessment   Upper Extremity Assessment Upper Extremity Assessment: Defer to OT evaluation    Lower Extremity Assessment Lower Extremity Assessment: Generalized weakness    Cervical / Trunk Assessment Cervical / Trunk Assessment: Normal  Communication   Communication: No difficulties  Cognition Arousal/Alertness: Awake/alert Behavior During Therapy: WFL for tasks assessed/performed Overall Cognitive Status: No family/caregiver present to determine baseline cognitive functioning                                 General Comments: Patient with difficulty staying on task. Patient very tangential during conversation. Patient easily distracted and would change topic of conversation sporadically.       General Comments General comments (skin integrity, edema, etc.): Patient reports dizziness prior to admission and experienced some throughout session.     Exercises     Assessment/Plan    PT Assessment Patient needs continued PT services  PT Problem List Decreased strength;Decreased range of motion;Decreased activity tolerance;Decreased balance;Decreased mobility;Decreased cognition;Decreased knowledge of use of DME;Decreased knowledge of precautions       PT Treatment Interventions DME instruction;Gait training;Stair training;Functional mobility training;Therapeutic activities;Therapeutic exercise;Balance training;Patient/family education;Cognitive remediation    PT Goals (Current goals can be found in the Care Plan section)  Acute Rehab PT Goals Patient Stated Goal: feel better PT Goal Formulation: With patient Time For Goal Achievement: 01/05/19 Potential to Achieve Goals: Fair    Frequency Min 2X/week   Barriers to discharge        Co-evaluation               AM-PAC PT "6 Clicks" Mobility  Outcome Measure Help needed turning from your back to your side while in a flat bed without using bedrails?: None Help needed moving from lying  on your back to sitting on the side of a flat bed without using bedrails?: None Help needed moving to and from a bed to a chair (including a wheelchair)?: A Little Help needed standing up from a chair using your arms (e.g., wheelchair or bedside chair)?: A Little Help needed to walk in hospital room?: A Little Help needed climbing 3-5 steps with a railing? : A Lot 6 Click Score: 19    End of Session Equipment Utilized During Treatment: Gait belt Activity Tolerance: Patient limited by fatigue Patient left: in bed;with call bell/phone within reach;with bed alarm set Nurse Communication: Mobility status PT Visit Diagnosis: Unsteadiness on feet (R26.81);Muscle weakness (generalized) (M62.81);History of falling (Z91.81)    Time: 1103-1594 PT Time Calculation (min) (ACUTE ONLY): 21 min   Charges:   PT Evaluation $PT Eval Moderate Complexity: 1 Mod          Erick Blinks, SPT  Erick Blinks 12/22/2018, 5:08 PM

## 2018-12-22 NOTE — ED Notes (Signed)
Patient continues to hyperventilate and be anxious. Patient, however, denies anxiety. Keeps stating "I don't know what's wrong with me, I have never been sick" etc. Will continue to monitor.

## 2018-12-22 NOTE — Progress Notes (Addendum)
Initial Nutrition Assessment  DOCUMENTATION CODES:   Non-severe (moderate) malnutrition in context of acute illness/injury  INTERVENTION:    Continue Boost Breeze po TID, each supplement provides 250 kcal and 9 grams of protein  Add Multivitamin daily  NUTRITION DIAGNOSIS:   Moderate Malnutrition related to acute illness (weakness since starting a new medication) as evidenced by mild fat depletion, mild muscle depletion, percent weight loss (6% weight loss within one month).  GOAL:   Patient will meet greater than or equal to 90% of their needs  MONITOR:   PO intake, Supplement acceptance, Skin, I & O's, Labs  REASON FOR ASSESSMENT:   Consult Assessment of nutrition requirement/status  ASSESSMENT:   73 yo female with PMH of asthma, HLD, HTN, ulcerative colitis, mitral valve prolapse, bicuspid aortic valve with mild stenosis, chronic pain, scoliosis, spinal stenosis, who was admitted with weakness since starting on Crestor, and chest pain.  Patient reports that she has been feeling weak since starting on a statin. Her legs became weak and wobbly after taking the second dose. She has been eating poorly for the past month, but even poorer over the past week. She is unsure why she doesn't feel hungry. She was constipated for a few days, but after that resolved, she did not take anymore laxatives. She says that constipation has not been a chronic problem recently. Prior to current acute illness, patient and husband both say that she was eating well and her weight had been trending up. She was given a Psychiatric nurse by RN and was drinking it during RD visit. She asked where she could purchase the supplement and we discussed retail stores that usually have it for sale, such as [employer] and [employer] outpatient pharmacy.   Noted questionable eating disorder.  Based on conversation with patient and her husband, suspect malnutrition and abnormal electrolytes on admission is related to  recent acute illness with poor intake and the abrupt stop in alcohol use vs eating disorder. However, it is possible that patient was not forthcoming regarding eating disorder behaviors.   Labs reviewed. Sodium 126 (L), phosphorus 1.4 (L), potassium 3.9 (WNL), prealbumin 22.8 (WNL) Medications reviewed and include K Phos, thiamine.  Patient with 6% weight loss within the past month, which is significant for the time frame. Physical exam revealed mild-moderate depletion of muscle and subcutaneous fat mass.  NUTRITION - FOCUSED PHYSICAL EXAM:    Most Recent Value  Orbital Region  Mild depletion  Upper Arm Region  Moderate depletion  Thoracic and Lumbar Region  Mild depletion  Buccal Region  Mild depletion  Temple Region  Mild depletion  Clavicle Bone Region  Mild depletion  Clavicle and Acromion Bone Region  Mild depletion  Scapular Bone Region  Mild depletion  Dorsal Hand  Moderate depletion  Patellar Region  Moderate depletion  Anterior Thigh Region  Moderate depletion  Posterior Calf Region  Mild depletion  Edema (RD Assessment)  None  Hair  Reviewed  Eyes  Reviewed  Mouth  Reviewed  Skin  Reviewed  Nails  Reviewed       Diet Order:   Diet Order            Diet regular Room service appropriate? Yes; Fluid consistency: Thin  Diet effective now              EDUCATION NEEDS:   No education needs have been identified at this time  Skin:  Skin Assessment: Reviewed RN Assessment  Last BM:  no BM documented  since admission  Height:   Ht Readings from Last 1 Encounters:  12/22/18 5' 5"  (1.651 m)    Weight:   Wt Readings from Last 1 Encounters:  12/22/18 62 kg    Ideal Body Weight:  56.8 kg  BMI:  Body mass index is 22.75 kg/m.  Estimated Nutritional Needs:   Kcal:  1600-1800  Protein:  75-85 gm  Fluid:  >/= 1.6 L    Molli Barrows, RD, LDN, [place] Pager [phone] After Hours Pager [phone]

## 2018-12-22 NOTE — ED Notes (Addendum)
Pt assisted to bedside commode. Pt given call light to notify RN when finished. Pt verbalizes understanding to press call light when finished.

## 2018-12-22 NOTE — Consult Note (Signed)
[facility] Consult   Reason for Consult:  Possible eating disorder Referring Physician:  Dr. Thereasa Solo Patient Identification: Marissa [name] MRN:  [id number] Principal Diagnosis: Anxiety Diagnosis:  Active Problems:   Hypothyroidism   ANEMIA, IRON DEFICIENCY   Aneurysm of thoracic aorta (HCC)   Asthma   B12 deficiency   Essential hypertension   Hyperlipidemia with target LDL less than 100   Prolonged QT interval   Hyponatremia   Chest pain   Malnutrition (HCC)   Hypokalemia   Hypomagnesemia   Hypophosphatemia   Acute metabolic encephalopathy   Alkalosis, metabolic   Alcohol abuse   Total Time spent with patient: 1 hour  Subjective:   Marissa [name] is a 73 y.o. female patient admitted with acute metabolic encephalopathy.  HPI:  Per chart review, patient was admitted with acute metabolic encephalopathy likely secondary to a combination of etiologies including dehydration with multiple electrolyte abnormalities, decreased PO intake and polypharmacy. Sodium was 118 on admission and is currently 122. She has a history of alcohol abuse. Her husband does not believe she has been drinking for several weeks. BAL was negative on admission. She has a history of laxative use and some weight loss over the past 3 weeks. Home medications include Xanax 0.25 mg daily PRN, Lunesta 3 mg qhs and Prozac 40 mg BID. She received Xanax 0.25 mg overnight.  On interview, Marissa [name] reports that she feels better since yesterday.  She reports that she felt overwhelmed in the setting of her medical condition.  She reports that she has been feeling sick for the past 2 weeks after she was started on a statin.  She has been nauseous and has also lost a few pounds.  She denies problems with her appetite prior to her GI symptoms.  She denies a history of disorderly eating.  She also reports weakness and muscle aches.  She reports doing well prior to starting a new medication.  She reports that she  is sensitive to medications.  She reports rarely requiring Xanax for intermittent anxiety.  She reports sleeping well at night with Lunesta use.  She was diagnosed with depression in 1996 after struggling with a viral illness.  She has been taking Prozac since this time and reports taking it once daily instead of twice a day.  She reports that it has helped with her mood and energy level.  She denies SI, HI or AVH.  Past Psychiatric History: Depression    Risk to Self:  None. Denies SI.  Risk to Others:  None. Denies HI. Prior Inpatient Therapy:  Denies  Prior Outpatient Therapy:  She is followed by Dr. Pricilla Holm.   Past Medical History:  Past Medical History:  Diagnosis Date  . Arthritis   . Asthma   . Bicuspid aortic valve 05/2017   Likely functional bicuspid aortic valve with sclerosis and no stenosis.  . Bursitis   . Cataract    mild  . Dysthymia   . Hemorrhoids   . Hx of ulcerative colitis    per dr Arnoldo Morale as per pt.  . Hyperlipidemia   . Hypertension    managed - labile.  . Migraine   . Scoliosis   . 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 Spinal stenosis   . Thoracic aortic aneurysm (HCC)    Stable 4.2-4.3  cm (followed by Dr. Roxan Hockey)  . Thyroid disease     Past Surgical History:  Procedure Laterality Date  . ABDOMINAL HYSTERECTOMY    . BUNIONECTOMY    . Cardiac Event Monitor  07/2017   Overall relatively normal.  Normal sinus rhythm with rare bradycardia and tachycardia.  Heart rate ranged from 55-110 bpm.  Occasional PACs and PVCs, every single 1 was felt.  No arrhythmias other than one short 4 beat run of PACs.  . COLONOSCOPY  02-04-2005   all normal   . FOOT SURGERY     3 pins in toes   . HAND SURGERY     left thumb joint resection  . nasal revision    . NM MYOVIEW LTD  07/2017   LOW RISK study.  No ischemia or infarction.  EF  greater than 65%.   [name] Kitchen SINUS IRRIGATION    . TONSILLECTOMY AND ADENOIDECTOMY    . TRANSTHORACIC ECHOCARDIOGRAM  06/'1, 8/'19   a) Moderate LVH. Normal EF 60-65%. Normal diastolic parameters. --> Difficult to fully visualize the aortic valve. Cannot exclude bicuspid valve. Mild aortic stenosis noted. No PFO. Mildly dilated left atrium. Trivial MR. No comment on mitral valve prolapse. Moderately dilated ascending aorta.;; b) F/u Echo To evaluate the aortic valve. -- Bicuspid AoV - mildly thickened / calcified. - No stenosis   Family History:  Family History  Problem Relation Age of Onset  . Arthritis Mother   . Leukemia Father   . Heart disease Maternal Uncle   . Breast cancer Paternal Aunt   . Diabetes Paternal Aunt   . Breast cancer Maternal Grandmother   . Heart disease Maternal Grandfather   . Breast cancer Paternal Grandmother   . Colon cancer Neg Hx    Family Psychiatric  History: Denies  Social History:  Social History   Substance and Sexual Activity  Alcohol Use Yes  . Alcohol/week: 0.0 standard drinks   Comment: occ      Social History   Substance and Sexual Activity  Drug Use No    Social History   Socioeconomic History  . Marital status: Married    Spouse name: Not on file  . Number of children: Not on file  . Years of education: Not on file  . Highest education level: Not on file  Occupational History  . Not on file  Social Needs  . Financial resource strain: Not hard at all  . Food insecurity:    Worry: Never true    Inability: Never true  . Transportation needs:    Medical: No    Non-medical: No  Tobacco Use  . Smoking status: Never Smoker  . Smokeless tobacco: Never Used  Substance and Sexual Activity  . Alcohol use: Yes    Alcohol/week: 0.0 standard drinks    Comment: occ   . Drug use: No  . Sexual activity: Not Currently  Lifestyle  . Physical activity:    Days per week: 0 days    Minutes per session: 0 min  . Stress: Not at all   Relationships  . Social connections:    Talks on phone: More than three times a week    Gets together: More than three times a week    Attends religious service: 1 to 4 times per year    Active member of club or organization: Yes    Attends meetings of clubs or organizations: More than 4 times per year    Relationship status: Married  Other Topics  Concern  . Not on file  Social History Narrative  . Not on file   Additional Social History: She lives at home with her husband. She denies current alcohol use. She denies illicit substance use. She receives disability for her medical conditions.     Allergies:   Allergies  Allergen Reactions  . Fentanyl Shortness Of Breath and Rash  . Amoxicillin Diarrhea    Severe diarrhea, rash to vaginal area with swelling   . Hctz [Hydrochlorothiazide] Other (See Comments)    HypoNatremia  . Hydralazine     Weak, sweats, red skin  . Codeine Other (See Comments)    makes her hyper  . Conjugated Estrogens Itching and Rash  . Erythromycin Rash    had a rash with emycin, has done ok with other meds in it's class  . Piroxicam Itching and Rash    Feldene    Labs:  Results for orders placed or performed during the hospital encounter of 12/21/18 (from the past 48 hour(s))  Comprehensive metabolic panel     Status: Abnormal   Collection Time: 12/21/18  6:38 PM  Result Value Ref Range   Sodium 120 (L) 135 - 145 mmol/L   Potassium 3.2 (L) 3.5 - 5.1 mmol/L   Chloride 87 (L) 98 - 111 mmol/L   CO2 17 (L) 22 - 32 mmol/L   Glucose, Bld 115 (H) 70 - 99 mg/dL   BUN 6 (L) 8 - 23 mg/dL   Creatinine, Ser 1.18 (H) 0.44 - 1.00 mg/dL   Calcium 9.1 8.9 - 10.3 mg/dL   Total Protein 6.5 6.5 - 8.1 g/dL   Albumin 4.2 3.5 - 5.0 g/dL   AST 34 15 - 41 U/L   ALT 33 0 - 44 U/L   Alkaline Phosphatase 38 38 - 126 U/L   Total Bilirubin 0.9 0.3 - 1.2 mg/dL   GFR calc non Af Amer 45 (L) >60 mL/min   GFR calc Af Amer 53 (L) >60 mL/min   Anion gap 16 (H) 5 - 15     Comment: Performed at [facility], [address], [place], [place] [address]  Troponin I - Once     Status: None   Collection Time: 12/21/18  6:38 PM  Result Value Ref Range   Troponin I <0.03 <0.03 ng/mL    Comment: Performed at [facility] [address], [place], [place] [address]  Brain natriuretic peptide     Status: Abnormal   Collection Time: 12/21/18  6:38 PM  Result Value Ref Range   B Natriuretic Peptide 114.2 (H) 0.0 - 100.0 pg/mL    Comment: Performed at [facility] [address], [place], [place] [address]  CBC with Differential     Status: Abnormal   Collection Time: 12/21/18  6:38 PM  Result Value Ref Range   WBC 6.1 4.0 - 10.5 K/uL   RBC 3.81 (L) 3.87 - 5.11 MIL/uL   Hemoglobin 12.4 12.0 - 15.0 g/dL   HCT 34.3 (L) 36.0 - 46.0 %   MCV 90.0 80.0 - 100.0 fL   MCH 32.5 26.0 - 34.0 pg   MCHC 36.2 (H) 30.0 - 36.0 g/dL   RDW 11.9 11.5 - 15.5 %   Platelets 229 150 - 400 K/uL   nRBC 0.0 0.0 - 0.2 %   Neutrophils Relative % 71 %   Neutro Abs 4.3 1.7 - 7.7 K/uL   Lymphocytes Relative 18 %   Lymphs Abs 1.1  0.7 - 4.0 K/uL   Monocytes Relative 10 %   Monocytes Absolute 0.6 0.1 - 1.0 K/uL   Eosinophils Relative 1 %   Eosinophils Absolute 0.0 0.0 - 0.5 K/uL   Basophils Relative 0 %   Basophils Absolute 0.0 0.0 - 0.1 K/uL   Immature Granulocytes 0 %   Abs Immature Granulocytes 0.01 0.00 - 0.07 K/uL    Comment: Performed at [facility] [address], [place], [place] [address]  CK     Status: None   Collection Time: 12/21/18  6:38 PM  Result Value Ref Range   Total CK 100 38 - 234 U/L    Comment: Performed at [facility], [place] [address], [place], [place] [address]  CBG monitoring, ED     Status: Abnormal   Collection Time: 12/21/18  6:38 PM  Result Value Ref Range   Glucose-Capillary 119 (H) 70 - 99 mg/dL  Urinalysis, Routine w reflex microscopic     Status: Abnormal   Collection Time: 12/21/18  7:26 PM  Result Value Ref Range    Color, Urine YELLOW YELLOW   APPearance CLEAR CLEAR   Specific Gravity, Urine 1.006 1.005 - 1.030   pH 6.0 5.0 - 8.0   Glucose, UA NEGATIVE NEGATIVE mg/dL   Hgb urine dipstick NEGATIVE NEGATIVE   Bilirubin Urine NEGATIVE NEGATIVE   Ketones, ur 5 (A) NEGATIVE mg/dL   Protein, ur NEGATIVE NEGATIVE mg/dL   Nitrite NEGATIVE NEGATIVE   Leukocytes,Ua NEGATIVE NEGATIVE    Comment: Performed at [facility] [address] [place], [place] [address]  Magnesium     Status: Abnormal   Collection Time: 12/21/18  7:27 PM  Result Value Ref Range   Magnesium 1.1 (L) 1.7 - 2.4 mg/dL    Comment: Performed at [facility] [address] [place], [place] [address]  Osmolality     Status: Abnormal   Collection Time: 12/21/18  7:27 PM  Result Value Ref Range   Osmolality 249 (LL) 275 - 295 mOsm/kg    Comment: REPEATED TO VERIFY CRITICAL RESULT CALLED TO, READ BACK BY AND VERIFIED WITH: Rochele Raring 2016 12/21/2018 D BRADLEY Performed at [facility], [place] [address], [place], [place] [address]   Osmolality, urine     Status: Abnormal   Collection Time: 12/21/18  8:06 PM  Result Value Ref Range   Osmolality, Ur 149 (L) 300 - 900 mOsm/kg    Comment: Performed at [facility] [address], [place], [place] [address]  Sodium, urine, random     Status: None   Collection Time: 12/21/18  8:06 PM  Result Value Ref Range   Sodium, Ur 13 mmol/L    Comment: Performed at [facility] [address], [place], [place] [address]  Phosphorus     Status: Abnormal   Collection Time: 12/21/18  9:12 PM  Result Value Ref Range   Phosphorus 1.7 (L) 2.5 - 4.6 mg/dL    Comment: Performed at [facility] [address], [place], [place] [address]  TSH     Status: None   Collection Time: 12/21/18  9:12 PM  Result Value Ref Range   TSH 0.547 0.350 - 4.500 uIU/mL    Comment: Performed by a 3rd Generation assay with a functional sensitivity of <=0.01 uIU/mL. Performed at [facility], [place] [address], [place], [place] [address]   Ethanol     Status:  None   Collection Time: 12/21/18  9:13 PM  Result Value Ref Range   Alcohol, Ethyl (B) <10 <10 mg/dL    Comment: (NOTE) Lowest detectable limit for serum alcohol is 10 mg/dL. For medical purposes only. Performed at [facility], [place] [address] [place], [place] [address]   POCT I-Stat EG7     Status: Abnormal   Collection Time: 12/21/18  9:22 PM  Result Value Ref Range   pH, Ven 7.654 (HH) 7.250 - 7.430   pCO2, Ven 15.7 (LL) 44.0 - 60.0 mmHg   pO2, Ven 19.0 (LL) 32.0 - 45.0 mmHg   Bicarbonate 17.4 (L) 20.0 - 28.0 mmol/L   TCO2 18 (L) 22 - 32 mmol/L   O2 Saturation 47.0 %   Acid-base deficit 1.0 0.0 - 2.0 mmol/L   Sodium 118 (LL) 135 - 145 mmol/L   Potassium 4.1 3.5 - 5.1 mmol/L   Calcium, Ion 1.00 (L) 1.15 - 1.40 mmol/L   HCT 31.0 (L) 36.0 - 46.0 %   Hemoglobin 10.5 (L) 12.0 - 15.0 g/dL   Patient temperature 37.0 C    Sample type VENOUS    Comment NOTIFIED PHYSICIAN   Salicylate level     Status: None   Collection Time: 12/21/18  9:34 PM  Result Value Ref Range   Salicylate Lvl <9.5 2.8 - 30.0 mg/dL    Comment: Performed at [facility], [place] [address] [place], [place] [address]  Acetaminophen level     Status: Abnormal   Collection Time: 12/21/18  9:34 PM  Result Value Ref Range   Acetaminophen (Tylenol), Serum <10 (L) 10 - 30 ug/mL    Comment: (NOTE) Therapeutic concentrations vary significantly. A range of 10-30 ug/mL  may be an effective concentration for many patients. However, some  are best treated at concentrations outside of this range. Acetaminophen concentrations >150 ug/mL at 4 hours after ingestion  and >50 ug/mL at 12 hours after ingestion are often associated with  toxic reactions. Performed at [facility], [place] [address] [place], [place] [address]   Beta-hydroxybutyric acid     Status: Abnormal   Collection Time: 12/21/18  9:34 PM  Result Value Ref  Range   Beta-Hydroxybutyric Acid 1.04 (H) 0.05 - 0.27 mmol/L    Comment: Performed at [facility] [address], [place], [place] [address]  Troponin I - Now Then Q6H     Status: None   Collection Time: 12/21/18  9:46 PM  Result Value Ref Range   Troponin I <0.03 <0.03 ng/mL    Comment: Performed at [facility] [address] [place], [place] [address]  Basic metabolic panel     Status: Abnormal   Collection Time: 12/22/18 12:37 AM  Result Value Ref Range   Sodium 121 (L) 135 - 145 mmol/L   Potassium 4.4 3.5 - 5.1 mmol/L   Chloride 93 (L) 98 - 111 mmol/L   CO2 16 (L) 22 - 32 mmol/L   Glucose, Bld 162 (H) 70 - 99 mg/dL   BUN 7 (L) 8 - 23 mg/dL   Creatinine, Ser 1.03 (H) 0.44 - 1.00 mg/dL   Calcium 8.7 (L) 8.9 - 10.3 mg/dL   GFR calc non Af Amer 54 (L) >60 mL/min   GFR calc Af Amer >60 >60 mL/min   Anion gap 12 5 - 15    Comment: Performed at [facility] [address], [place], [place] [address]  D-dimer,  quantitative (not at [facility])     Status: Abnormal   Collection Time: 12/22/18 12:37 AM  Result Value Ref Range   D-Dimer, Quant 0.54 (H) 0.00 - 0.50 ug/mL-FEU    Comment: (NOTE) At the manufacturer cut-off of 0.50 ug/mL FEU, this assay has been documented to exclude PE with a sensitivity and negative predictive value of 97 to 99%.  At this time, this assay has not been approved by the FDA to exclude DVT/VTE. Results should be correlated with clinical presentation. Performed at [facility], [place] [address] [place], [place] [address]   Ammonia     Status: None   Collection Time: 12/22/18  1:23 AM  Result Value Ref Range   Ammonia 20 9 - 35 umol/L    Comment: Performed at [facility], [place] [address], [place], [place] [address]  Troponin I - Now Then Q6H     Status: None   Collection Time: 12/22/18  3:45 AM  Result Value Ref Range   Troponin I <0.03 <0.03 ng/mL    Comment: Performed at [facility] [address], [place],  [place] [address]  TSH     Status: None   Collection Time: 12/22/18  3:45 AM  Result Value Ref Range   TSH 0.813 0.350 - 4.500 uIU/mL    Comment: Performed by a 3rd Generation assay with a functional sensitivity of <=0.01 uIU/mL. Performed at [facility], [place] [address] [place], [place] [address]   CBC     Status: Abnormal   Collection Time: 12/22/18  3:45 AM  Result Value Ref Range   WBC 6.8 4.0 - 10.5 K/uL   RBC 3.44 (L) 3.87 - 5.11 MIL/uL   Hemoglobin 11.4 (L) 12.0 - 15.0 g/dL   HCT 31.7 (L) 36.0 - 46.0 %   MCV 92.2 80.0 - 100.0 fL   MCH 33.1 26.0 - 34.0 pg   MCHC 36.0 30.0 - 36.0 g/dL   RDW 12.1 11.5 - 15.5 %   Platelets 177 150 - 400 K/uL   nRBC 0.0 0.0 - 0.2 %    Comment: Performed at [facility], [place] [address] [place], [place] [address]  Vitamin B12     Status: Abnormal   Collection Time: 12/22/18  3:45 AM  Result Value Ref Range   Vitamin B-12 1,358 (H) 180 - 914 pg/mL    Comment: (NOTE) This assay is not validated for testing neonatal or myeloproliferative syndrome specimens for Vitamin B12 levels. Performed at [facility], [place] [address], [place], [place] [address]   Prealbumin     Status: None   Collection Time: 12/22/18  3:45 AM  Result Value Ref Range   Prealbumin 22.8 18 - 38 mg/dL    Comment: Performed at [facility] [address], [place], [place] [address]  Comprehensive metabolic panel     Status: Abnormal   Collection Time: 12/22/18  3:45 AM  Result Value Ref Range   Sodium 123 (L) 135 - 145 mmol/L   Potassium 4.4 3.5 - 5.1 mmol/L   Chloride 93 (L) 98 - 111 mmol/L   CO2 15 (L) 22 - 32 mmol/L   Glucose, Bld 95 70 - 99 mg/dL   BUN 5 (L) 8 - 23 mg/dL   Creatinine, Ser 1.02 (H) 0.44 - 1.00 mg/dL   Calcium 8.6 (L) 8.9 - 10.3 mg/dL   Total Protein 5.7 (L) 6.5 - 8.1 g/dL   Albumin  3.8 3.5 - 5.0 g/dL   AST 29 15 - 41 U/L   ALT 28 0 - 44 U/L   Alkaline Phosphatase 37 (L) 38 - 126 U/L   Total Bilirubin 0.9 0.3 - 1.2 mg/dL   GFR  calc non Af Amer 54 (L) >60 mL/min   GFR calc Af Amer >60 >60 mL/min   Anion gap 15 5 - 15    Comment: Performed at [facility] [address] [place], [place] [address]  Comprehensive metabolic panel     Status: Abnormal   Collection Time: 12/22/18  5:00 AM  Result Value Ref Range   Sodium 120 (L) 135 - 145 mmol/L   Potassium 3.4 (L) 3.5 - 5.1 mmol/L    Comment: NO VISIBLE HEMOLYSIS   Chloride 91 (L) 98 - 111 mmol/L   CO2 13 (L) 22 - 32 mmol/L   Glucose, Bld 99 70 - 99 mg/dL   BUN 7 (L) 8 - 23 mg/dL   Creatinine, Ser 0.95 0.44 - 1.00 mg/dL   Calcium 8.4 (L) 8.9 - 10.3 mg/dL   Total Protein 5.9 (L) 6.5 - 8.1 g/dL   Albumin 3.9 3.5 - 5.0 g/dL   AST 31 15 - 41 U/L   ALT 26 0 - 44 U/L   Alkaline Phosphatase 37 (L) 38 - 126 U/L   Total Bilirubin 0.5 0.3 - 1.2 mg/dL   GFR calc non Af Amer 59 (L) >60 mL/min   GFR calc Af Amer >60 >60 mL/min   Anion gap 16 (H) 5 - 15    Comment: Performed at [facility], [place] [address], [place], [place] [address]  Magnesium     Status: None   Collection Time: 12/22/18  5:00 AM  Result Value Ref Range   Magnesium 2.3 1.7 - 2.4 mg/dL    Comment: Performed at [facility], [place] [address] [place], [place] [address]  Phosphorus     Status: Abnormal   Collection Time: 12/22/18  5:00 AM  Result Value Ref Range   Phosphorus 1.4 (L) 2.5 - 4.6 mg/dL    Comment: Performed at [facility] [address], [place], [place] [address]  Troponin I -     Status: None   Collection Time: 12/22/18  5:00 AM  Result Value Ref Range   Troponin I <0.03 <0.03 ng/mL    Comment: Performed at [facility] [address] [place] [address], [place], [place] [address]  Basic metabolic panel     Status: Abnormal   Collection Time: 12/22/18  5:09 AM  Result Value Ref Range   Sodium 122 (L) 135 - 145 mmol/L   Potassium 3.7 3.5 - 5.1 mmol/L   Chloride 93 (L) 98 - 111 mmol/L   CO2 16 (L) 22 - 32 mmol/L   Glucose, Bld 103 (H) 70 - 99 mg/dL   BUN 6 (L) 8 - 23  mg/dL   Creatinine, Ser 0.93 0.44 - 1.00 mg/dL   Calcium 8.5 (L) 8.9 - 10.3 mg/dL   GFR calc non Af Amer >60 >60 mL/min   GFR calc Af Amer >60 >60 mL/min   Anion gap 13 5 - 15    Comment: Performed at [facility] [address] [place],  [address]    Current Facility-Administered Medications  Medication Dose Route Frequency Provider Last Rate Last Dose  . ALPRAZolam (XANAX) tablet 0.25 mg  0.25 mg Oral Daily PRN Toy Baker, MD  0.25 mg at 12/21/18 2236  . carisoprodol (SOMA) tablet 350 mg  350 mg Oral TID PRN Cherene Altes, MD   350 mg at 12/22/18 1006  . feeding supplement (BOOST / RESOURCE BREEZE) liquid 1 Container  1 Container Oral TID BM Doutova, Anastassia, MD      . levothyroxine (SYNTHROID, LEVOTHROID) tablet 125 mcg  125 mcg Oral Daily Toy Baker, MD   125 mcg at 12/22/18 0926  . LORazepam (ATIVAN) injection 2-3 mg  2-3 mg Intravenous Q1H PRN Doutova, Anastassia, MD      . nebivolol (BYSTOLIC) tablet 10 mg  10 mg Oral Daily Doutova, Anastassia, MD   10 mg at 12/22/18 0928  . phosphorus (K PHOS NEUTRAL) tablet 500 mg  500 mg Oral BID Toy Baker, MD   500 mg at 12/22/18 0927  . thiamine 566m in normal saline (553m IVPB  500 mg Intravenous TID DoToy BakerMD   Stopped at 12/22/18 1010   Current Outpatient Medications  Medication Sig Dispense Refill  . albuterol (PROAIR HFA) 108 (90 Base) MCG/ACT inhaler INHALE 2 PUFFS INTO THE LUNGS EVERY 6 (SIX) HOURS AS NEEDED. (Patient taking differently: Inhale 2 puffs into the lungs every 6 (six) hours as needed for shortness of breath. ) 8.5 each 2  . ALPRAZolam (XANAX) 0.25 MG tablet Take 1 tablet (0.25 mg total) by mouth daily as needed for anxiety. 30 tablet 2  . carisoprodol (SOMA) 350 MG tablet TAKE 1 TABLET(350 MG) BY MOUTH THREE TIMES DAILY AS NEEDED (Patient taking differently: Take 350 mg by mouth 3 (three) times daily as needed for muscle spasms. ) 60 tablet 2  . cloNIDine  (CATAPRES) 0.1 MG tablet TAKE 1 TABLET BY MOUTH THREE TIMES DAILY AS NEEDED. TAKE ONLY AS NEEDED FOR SYSTOLIC BLOOD PRESSURE ABOVE 160 (Patient taking differently: Take 0.1 mg by mouth 3 (three) times daily. Take only as needed for systolic blood pressure above) 15 tablet 6  . cycloSPORINE (RESTASIS) 0.05 % ophthalmic emulsion Place 1 drop into both eyes 2 (two) times daily.    . Eszopiclone 3 MG TABS TAKE 1 TABLET BY MOUTH IMMEDIATELY BEFORE BEDTIME (Patient taking differently: Take 3 mg by mouth at bedtime. ) 30 tablet 3  . famotidine (PEPCID) 20 MG tablet Take 20 mg by mouth at bedtime.    . [name] KitchenLUoxetine (PROZAC) 40 MG capsule TAKE 1 CAPSULE(40 MG) BY MOUTH TWICE DAILY (Patient taking differently: Take 40 mg by mouth 2 (two) times daily. ) 60 capsule 5  . levothyroxine (SYNTHROID, LEVOTHROID) 125 MCG tablet TAKE 1 TABLET(125 MCG) BY MOUTH DAILY (Patient taking differently: Take 125 mcg by mouth daily. ) 90 tablet 1  . MINIVELLE 0.05 MG/24HR patch Place 1 patch onto the skin 2 (two) times a week.   3  . montelukast (SINGULAIR) 10 MG tablet TAKE 1 TABLET(10 MG) BY MOUTH AT BEDTIME (Patient taking differently: Take 10 mg by mouth at bedtime. ) 90 tablet 1  . MYRBETRIQ 50 MG TB24 tablet TAKE 1 TABLET(50 MG) BY MOUTH DAILY (Patient taking differently: Take 50 mg by mouth daily. ) 90 tablet 0  . nebivolol (BYSTOLIC) 10 MG tablet Take 1 tablet (10 mg total) by mouth daily. y 21 tablet 0  . spironolactone (ALDACTONE) 25 MG tablet TAKE 1 TABLET(25 MG) BY MOUTH DAILY (Patient taking differently: Take 25 mg by mouth daily. ) 30 tablet 3  . valsartan (DIOVAN) 320 MG tablet TAKE 1 TABLET(320 MG) BY MOUTH EVERY MORNING (Patient taking differently: Take 320  mg by mouth daily. ) 30 tablet 6  . verapamil (CALAN-SR) 180 MG CR tablet Take 1 tablet (180 mg total) by mouth at bedtime. 30 tablet 2  . Respiratory Therapy Supplies (FLUTTER) DEVI Use as directed 1 each 0  . rosuvastatin (CRESTOR) 10 MG tablet Take 1 tablet  2x/week for 2 weeks, then increase to 3x/week for 2 weeks, then f/u with clinic (Patient not taking: Reported on 12/21/2018) 30 tablet 1    Musculoskeletal: Strength & Muscle Tone: within normal limits Gait & Station: UTA since patient is lying in bed.  Patient leans: N/A  Psychiatric Specialty Exam: Physical Exam  Nursing note and vitals reviewed. Constitutional: She is oriented to person, place, and time. She appears well-developed and well-nourished.  HENT:  Head: Normocephalic and atraumatic.  Neck: Normal range of motion.  Respiratory: Effort normal.  Musculoskeletal: Normal range of motion.  Neurological: She is alert and oriented to person, place, and time.  Psychiatric: She has a normal mood and affect. Her speech is normal and behavior is normal. Judgment and thought content normal. Cognition and memory are normal.    Review of Systems  Cardiovascular: Positive for chest pain.  Gastrointestinal: Negative for abdominal pain, constipation, nausea and vomiting.  Psychiatric/Behavioral: Negative for depression, hallucinations, substance abuse and suicidal ideas. The patient is not nervous/anxious and does not have insomnia.   All other systems reviewed and are negative.   Blood pressure 139/69, pulse 65, temperature 97.8 F (36.6 C), temperature source Oral, resp. rate 19, height 5' 5"  (1.651 m), weight 62 kg, SpO2 100 %.Body mass index is 22.75 kg/m.  General Appearance: Fairly Groomed, elderly, Caucasian female, wearing a hospital gown with short, blond hair who is lying in bed. NAD.   Eye Contact:  Good  Speech:  Clear and Coherent and Normal Rate  Volume:  Normal  Mood:  Euthymic  Affect:  Appropriate and Congruent  Thought Process:  Goal Directed, Linear and Descriptions of Associations: Intact  Orientation:  Full (Time, Place, and Person)  Thought Content:  Logical  Suicidal Thoughts:  No  Homicidal Thoughts:  No  Memory:  Immediate;   Good Recent;   Good Remote;    Good  Judgement:  Fair  Insight:  Fair  Psychomotor Activity:  Normal  Concentration:  Concentration: Good and Attention Span: Good  Recall:  Good  Fund of Knowledge:  Good  Language:  Good  Akathisia:  No  Handed:  Right  AIMS (if indicated):   N/A  Assets:  Communication Skills Desire for Improvement Financial Resources/Insurance Housing Intimacy Resilience Social Support  ADL's:  Intact  Cognition:  WNL  Sleep:   Okay   Assessment:  Marissa [name] is a 73 y.o. female who was admitted with acute metabolic encephalopathy likely secondary to a combination of etiologies including dehydration with multiple electrolyte abnormalities, decreased PO intake and polypharmacy. She reports an improvement in her anxiety since admission. She reports intermittent anxiety in the setting of stressors. She denies SI, HI or AVH. She denies problems with appetite prior to recent illness. She denies a history of disorderly eating. She should follow up with her outpatient provider for further medication management.    Treatment Plan Summary: -Continue Prozac 40 mg daily for mood. Patient reports only taking this medication once daily. -Continue Lunesta 3 mg qhs for insomnia.  -Continue Xanax 0.25 mg daily PRN for anxiety. Patient reports rare use of Xanax. Would recommend taper to discontinuation given rare use and potential  for abuse and risk of death and/or respiratory sedation with likely concurrent alcohol use.   -EKG reviewed and QTc 541 on 2/24. Please closely monitor when starting or increasing QTc prolonging agents.  -Psychiatry will sign off on patient at this time. Please consult psychiatry again as needed.    Disposition: No evidence of imminent risk to self or others at present.   Patient does not meet criteria for psychiatric inpatient admission.  Faythe Dingwall, DO 12/22/2018 12:30 PM

## 2018-12-22 NOTE — ED Notes (Signed)
Patient requesting to speak with admitting MD (request sent). States she is in a lot of pain.

## 2018-12-22 NOTE — ED Notes (Addendum)
ED TO INPATIENT HANDOFF REPORT  ED Nurse Name and Phone #:  Michelle/Nadia, RN [phone]  S Name/Age/Gender Marissa [name] 73 y.o. female Room/Bed: 054C/054C  Code Status   Code Status: Full Code  Home/SNF/Other Home Patient oriented to: self, place, time, situation Is this baseline? Yes   Triage Complete: Triage complete  Chief Complaint GENERAL WEAKNESS AND CHEST PAIN  Triage Note Pt states generalized weakness since Tuesday and L sided chest pain today.  Took 325 mg asa at 1715, which removed chest pain.  Initially pt was cool to touch and pale, per [facility].  18g R ac.  cbg 110. nsr on monitor, per [facility].  169/79, hr 65 nsr, 100% ra. Orthostatics performed at pt home and pt was not orthostatic.   Allergies Allergies  Allergen Reactions  . Fentanyl Shortness Of Breath and Rash  . Amoxicillin Diarrhea    Severe diarrhea, rash to vaginal area with swelling   . Hctz [Hydrochlorothiazide] Other (See Comments)    HypoNatremia  . Hydralazine     Weak, sweats, red skin  . Codeine Other (See Comments)    makes her hyper  . Conjugated Estrogens Itching and Rash  . Erythromycin Rash    had a rash with emycin, has done ok with other meds in it's class  . Piroxicam Itching and Rash    Feldene    Level of Care/Admitting Diagnosis ED Disposition    ED Disposition Condition [facility]: [place] [[id number]]  Level of Care: Progressive [102]  Diagnosis: Hyponatremia [454098]  Admitting Physician: Toy Baker [[id number]]  Attending Physician: Toy Baker [[id number]]  Estimated length of stay: 3 - 4 days  Certification:: I certify this patient will need inpatient services for at least 2 midnights  PT Class (Do Not Modify): Inpatient [101]  PT Acc Code (Do Not Modify): Private [1]       B Medical/Surgery History Past Medical History:  Diagnosis Date  . Arthritis   . Asthma   . Bicuspid aortic valve 05/2017   Likely functional  bicuspid aortic valve with sclerosis and no stenosis.  . Bursitis   . Cataract    mild  . Dysthymia   . Hemorrhoids   . Hx of ulcerative colitis    per dr Arnoldo Morale as per pt.  . Hyperlipidemia   . Hypertension    managed - labile.  . Migraine   . Scoliosis   . 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 Spinal stenosis   . Thoracic aortic aneurysm (HCC)    Stable 4.2-4.3 cm (followed by Dr. Roxan Hockey)  . Thyroid disease    Past Surgical History:  Procedure Laterality Date  . ABDOMINAL HYSTERECTOMY    . BUNIONECTOMY    . Cardiac Event Monitor  07/2017   Overall relatively normal.  Normal sinus rhythm with rare bradycardia and tachycardia.  Heart rate ranged from 55-110 bpm.  Occasional PACs and PVCs, every single 1 was felt.  No arrhythmias other than one short 4 beat run of PACs.  . COLONOSCOPY  02-04-2005   all normal   . FOOT SURGERY     3 pins in toes   . HAND SURGERY     left thumb joint resection  . nasal revision    . NM MYOVIEW LTD  07/2017   LOW  RISK study.  No ischemia or infarction.  EF greater than 65%.   [name] Kitchen SINUS IRRIGATION    . TONSILLECTOMY AND ADENOIDECTOMY    . TRANSTHORACIC ECHOCARDIOGRAM  06/'1, 8/'19   a) Moderate LVH. Normal EF 60-65%. Normal diastolic parameters. --> Difficult to fully visualize the aortic valve. Cannot exclude bicuspid valve. Mild aortic stenosis noted. No PFO. Mildly dilated left atrium. Trivial MR. No comment on mitral valve prolapse. Moderately dilated ascending aorta.;; b) F/u Echo To evaluate the aortic valve. -- Bicuspid AoV - mildly thickened / calcified. - No stenosis     A IV Location/Drains/Wounds Patient Lines/Drains/Airways Status   Active Line/Drains/Airways    Name:   Placement date:   Placement time:   Site:   Days:   Peripheral IV 12/21/18 Right Antecubital   12/21/18    1822    Antecubital   1           Intake/Output Last 24 hours  Intake/Output Summary (Last 24 hours) at 12/22/2018 1231 Last data filed at 12/22/2018 0859 Gross per 24 hour  Intake 50 ml  Output -  Net 50 ml    Labs/Imaging Results for orders placed or performed during the hospital encounter of 12/21/18 (from the past 48 hour(s))  Comprehensive metabolic panel     Status: Abnormal   Collection Time: 12/21/18  6:38 PM  Result Value Ref Range   Sodium 120 (L) 135 - 145 mmol/L   Potassium 3.2 (L) 3.5 - 5.1 mmol/L   Chloride 87 (L) 98 - 111 mmol/L   CO2 17 (L) 22 - 32 mmol/L   Glucose, Bld 115 (H) 70 - 99 mg/dL   BUN 6 (L) 8 - 23 mg/dL   Creatinine, Ser 1.18 (H) 0.44 - 1.00 mg/dL   Calcium 9.1 8.9 - 10.3 mg/dL   Total Protein 6.5 6.5 - 8.1 g/dL   Albumin 4.2 3.5 - 5.0 g/dL   AST 34 15 - 41 U/L   ALT 33 0 - 44 U/L   Alkaline Phosphatase 38 38 - 126 U/L   Total Bilirubin 0.9 0.3 - 1.2 mg/dL   GFR calc non Af Amer 45 (L) >60 mL/min   GFR calc Af Amer 53 (L) >60 mL/min   Anion gap 16 (H) 5 - 15    Comment: Performed at [facility], [address] [place], [place] [address]  Troponin I - Once     Status: None   Collection Time: 12/21/18  6:38 PM  Result Value Ref Range   Troponin I <0.03 <0.03 ng/mL    Comment: Performed at [facility] [address] [place], [place] [address]  Brain natriuretic peptide     Status: Abnormal   Collection Time: 12/21/18  6:38 PM  Result Value Ref Range   B Natriuretic Peptide 114.2 (H) 0.0 - 100.0 pg/mL    Comment: Performed at [facility] [address], [place], [place] [address]  CBC with Differential     Status: Abnormal   Collection Time: 12/21/18  6:38 PM  Result Value Ref Range   WBC 6.1 4.0 - 10.5 K/uL   RBC 3.81 (L) 3.87 - 5.11 MIL/uL   Hemoglobin 12.4 12.0 - 15.0 g/dL   HCT 34.3 (L) 36.0 - 46.0 %   MCV 90.0 80.0 - 100.0 fL   MCH 32.5 26.0 - 34.0 pg   MCHC 36.2 (H) 30.0 - 36.0 g/dL   RDW 11.9 11.5 - 15.5 %  Platelets 229 150 - 400 K/uL    nRBC 0.0 0.0 - 0.2 %   Neutrophils Relative % 71 %   Neutro Abs 4.3 1.7 - 7.7 K/uL   Lymphocytes Relative 18 %   Lymphs Abs 1.1 0.7 - 4.0 K/uL   Monocytes Relative 10 %   Monocytes Absolute 0.6 0.1 - 1.0 K/uL   Eosinophils Relative 1 %   Eosinophils Absolute 0.0 0.0 - 0.5 K/uL   Basophils Relative 0 %   Basophils Absolute 0.0 0.0 - 0.1 K/uL   Immature Granulocytes 0 %   Abs Immature Granulocytes 0.01 0.00 - 0.07 K/uL    Comment: Performed at [facility] [address] [place], [place] [address]  CK     Status: None   Collection Time: 12/21/18  6:38 PM  Result Value Ref Range   Total CK 100 38 - 234 U/L    Comment: Performed at [facility], [place] [address] [place], [place] [address]  CBG monitoring, ED     Status: Abnormal   Collection Time: 12/21/18  6:38 PM  Result Value Ref Range   Glucose-Capillary 119 (H) 70 - 99 mg/dL  Urinalysis, Routine w reflex microscopic     Status: Abnormal   Collection Time: 12/21/18  7:26 PM  Result Value Ref Range   Color, Urine YELLOW YELLOW   APPearance CLEAR CLEAR   Specific Gravity, Urine 1.006 1.005 - 1.030   pH 6.0 5.0 - 8.0   Glucose, UA NEGATIVE NEGATIVE mg/dL   Hgb urine dipstick NEGATIVE NEGATIVE   Bilirubin Urine NEGATIVE NEGATIVE   Ketones, ur 5 (A) NEGATIVE mg/dL   Protein, ur NEGATIVE NEGATIVE mg/dL   Nitrite NEGATIVE NEGATIVE   Leukocytes,Ua NEGATIVE NEGATIVE    Comment: Performed at [facility] [address] [place], [place] [address]  Magnesium     Status: Abnormal   Collection Time: 12/21/18  7:27 PM  Result Value Ref Range   Magnesium 1.1 (L) 1.7 - 2.4 mg/dL    Comment: Performed at [facility] [address], [place], [place] [address]  Osmolality     Status: Abnormal   Collection Time: 12/21/18  7:27 PM  Result Value Ref Range   Osmolality 249 (LL) 275 - 295 mOsm/kg    Comment: REPEATED TO VERIFY CRITICAL RESULT CALLED TO, READ BACK BY AND VERIFIED WITH: Rochele Raring 2016  12/21/2018 D BRADLEY Performed at [facility], [place] [address], [place], [place] [address]   Osmolality, urine     Status: Abnormal   Collection Time: 12/21/18  8:06 PM  Result Value Ref Range   Osmolality, Ur 149 (L) 300 - 900 mOsm/kg    Comment: Performed at [facility] [address] [place], [place] [address]  Sodium, urine, random     Status: None   Collection Time: 12/21/18  8:06 PM  Result Value Ref Range   Sodium, Ur 13 mmol/L    Comment: Performed at [facility] [address], [place],  [address]  Phosphorus     Status: Abnormal   Collection Time: 12/21/18  9:12 PM  Result Value Ref Range   Phosphorus 1.7 (L) 2.5 - 4.6 mg/dL    Comment: Performed at [facility] [address] [place],  [address]  TSH     Status: None   Collection Time: 12/21/18  9:12 PM  Result Value Ref Range   TSH 0.547 0.350 -  4.500 uIU/mL    Comment: Performed by a 3rd Generation assay with a functional sensitivity of <=0.01 uIU/mL. Performed at [facility], [place] [address], [place], [place] [address]   Ethanol     Status: None   Collection Time: 12/21/18  9:13 PM  Result Value Ref Range   Alcohol, Ethyl (B) <10 <10 mg/dL    Comment: (NOTE) Lowest detectable limit for serum alcohol is 10 mg/dL. For medical purposes only. Performed at [facility], [place] [address], , [place] [address]   POCT I-Stat EG7     Status: Abnormal   Collection Time: 12/21/18  9:22 PM  Result Value Ref Range   pH, Ven 7.654 (HH) 7.250 - 7.430   pCO2, Ven 15.7 (LL) 44.0 - 60.0 mmHg   pO2, Ven 19.0 (LL) 32.0 - 45.0 mmHg   Bicarbonate 17.4 (L) 20.0 - 28.0 mmol/L   TCO2 18 (L) 22 - 32 mmol/L   O2 Saturation 47.0 %   Acid-base deficit 1.0 0.0 - 2.0 mmol/L   Sodium 118 (LL) 135 - 145 mmol/L   Potassium 4.1 3.5 - 5.1 mmol/L   Calcium, Ion 1.00 (L) 1.15 - 1.40 mmol/L   HCT 31.0 (L) 36.0 - 46.0 %   Hemoglobin 10.5 (L) 12.0 - 15.0 g/dL   Patient temperature  37.0 C    Sample type VENOUS    Comment NOTIFIED PHYSICIAN   Salicylate level     Status: None   Collection Time: 12/21/18  9:34 PM  Result Value Ref Range   Salicylate Lvl <4.6 2.8 - 30.0 mg/dL    Comment: Performed at [facility], [place] [address], [place], [place] [address]  Acetaminophen level     Status: Abnormal   Collection Time: 12/21/18  9:34 PM  Result Value Ref Range   Acetaminophen (Tylenol), Serum <10 (L) 10 - 30 ug/mL    Comment: (NOTE) Therapeutic concentrations vary significantly. A range of 10-30 ug/mL  may be an effective concentration for many patients. However, some  are best treated at concentrations outside of this range. Acetaminophen concentrations >150 ug/mL at 4 hours after ingestion  and >50 ug/mL at 12 hours after ingestion are often associated with  toxic reactions. Performed at [facility], [place] [address], [place], [place] [address]   Beta-hydroxybutyric acid     Status: Abnormal   Collection Time: 12/21/18  9:34 PM  Result Value Ref Range   Beta-Hydroxybutyric Acid 1.04 (H) 0.05 - 0.27 mmol/L    Comment: Performed at [facility] [address], [place], [place] [address]  Troponin I - Now Then Q6H     Status: None   Collection Time: 12/21/18  9:46 PM  Result Value Ref Range   Troponin I <0.03 <0.03 ng/mL    Comment: Performed at [facility] [address], [place], [place] [address]  Basic metabolic panel     Status: Abnormal   Collection Time: 12/22/18 12:37 AM  Result Value Ref Range   Sodium 121 (L) 135 - 145 mmol/L   Potassium 4.4 3.5 - 5.1 mmol/L   Chloride 93 (L) 98 - 111 mmol/L   CO2 16 (L) 22 - 32 mmol/L   Glucose, Bld 162 (H) 70 - 99 mg/dL   BUN 7 (L) 8 - 23 mg/dL   Creatinine, Ser 1.03 (H) 0.44 - 1.00 mg/dL   Calcium 8.7 (L) 8.9 - 10.3 mg/dL   GFR calc non Af Wyvonnia Lora  54 (L) >60 mL/min   GFR calc Af Amer >60 >60 mL/min   Anion gap 12 5 - 15    Comment: Performed at [facility] [address],  [place], [place] [address]  D-dimer, quantitative (not at [facility])     Status: Abnormal   Collection Time: 12/22/18 12:37 AM  Result Value Ref Range   D-Dimer, Quant 0.54 (H) 0.00 - 0.50 ug/mL-FEU    Comment: (NOTE) At the manufacturer cut-off of 0.50 ug/mL FEU, this assay has been documented to exclude PE with a sensitivity and negative predictive value of 97 to 99%.  At this time, this assay has not been approved by the FDA to exclude DVT/VTE. Results should be correlated with clinical presentation. Performed at [facility], [place] [address], [place], [place] [address]   Ammonia     Status: None   Collection Time: 12/22/18  1:23 AM  Result Value Ref Range   Ammonia 20 9 - 35 umol/L    Comment: Performed at [facility], [place] [address], [place], [place] [address]  Troponin I - Now Then Q6H     Status: None   Collection Time: 12/22/18  3:45 AM  Result Value Ref Range   Troponin I <0.03 <0.03 ng/mL    Comment: Performed at [facility] [address], [place], [place] [address]  TSH     Status: None   Collection Time: 12/22/18  3:45 AM  Result Value Ref Range   TSH 0.813 0.350 - 4.500 uIU/mL    Comment: Performed by a 3rd Generation assay with a functional sensitivity of <=0.01 uIU/mL. Performed at [facility], [place] [address], [place], [place] [address]   CBC     Status: Abnormal   Collection Time: 12/22/18  3:45 AM  Result Value Ref Range   WBC 6.8 4.0 - 10.5 K/uL   RBC 3.44 (L) 3.87 - 5.11 MIL/uL   Hemoglobin 11.4 (L) 12.0 - 15.0 g/dL   HCT 31.7 (L) 36.0 - 46.0 %   MCV 92.2 80.0 - 100.0 fL   MCH 33.1 26.0 - 34.0 pg   MCHC 36.0 30.0 - 36.0 g/dL   RDW 12.1 11.5 - 15.5 %   Platelets 177 150 - 400 K/uL   nRBC 0.0 0.0 - 0.2 %    Comment: Performed at [facility], [place] [address], [place], [place] [address]  Vitamin B12     Status: Abnormal   Collection Time: 12/22/18  3:45 AM  Result Value Ref Range   Vitamin B-12 1,358 (H) 180 - 914 pg/mL     Comment: (NOTE) This assay is not validated for testing neonatal or myeloproliferative syndrome specimens for Vitamin B12 levels. Performed at [facility], [place] [address] [place], [place] [address]   Prealbumin     Status: None   Collection Time: 12/22/18  3:45 AM  Result Value Ref Range   Prealbumin 22.8 18 - 38 mg/dL    Comment: Performed at [facility] [address] [place], [place] [address]  Comprehensive metabolic panel     Status: Abnormal   Collection Time: 12/22/18  3:45 AM  Result Value Ref Range   Sodium 123 (L) 135 - 145 mmol/L   Potassium 4.4 3.5 - 5.1 mmol/L   Chloride 93 (L) 98 - 111 mmol/L   CO2 15 (L) 22 - 32 mmol/L   Glucose, Bld 95 70 - 99 mg/dL  BUN 5 (L) 8 - 23 mg/dL   Creatinine, Ser 1.02 (H) 0.44 - 1.00 mg/dL   Calcium 8.6 (L) 8.9 - 10.3 mg/dL   Total Protein 5.7 (L) 6.5 - 8.1 g/dL   Albumin 3.8 3.5 - 5.0 g/dL   AST 29 15 - 41 U/L   ALT 28 0 - 44 U/L   Alkaline Phosphatase 37 (L) 38 - 126 U/L   Total Bilirubin 0.9 0.3 - 1.2 mg/dL   GFR calc non Af Amer 54 (L) >60 mL/min   GFR calc Af Amer >60 >60 mL/min   Anion gap 15 5 - 15    Comment: Performed at [facility] [address], [place], [place] [address]  Comprehensive metabolic panel     Status: Abnormal   Collection Time: 12/22/18  5:00 AM  Result Value Ref Range   Sodium 120 (L) 135 - 145 mmol/L   Potassium 3.4 (L) 3.5 - 5.1 mmol/L    Comment: NO VISIBLE HEMOLYSIS   Chloride 91 (L) 98 - 111 mmol/L   CO2 13 (L) 22 - 32 mmol/L   Glucose, Bld 99 70 - 99 mg/dL   BUN 7 (L) 8 - 23 mg/dL   Creatinine, Ser 0.95 0.44 - 1.00 mg/dL   Calcium 8.4 (L) 8.9 - 10.3 mg/dL   Total Protein 5.9 (L) 6.5 - 8.1 g/dL   Albumin 3.9 3.5 - 5.0 g/dL   AST 31 15 - 41 U/L   ALT 26 0 - 44 U/L   Alkaline Phosphatase 37 (L) 38 - 126 U/L   Total Bilirubin 0.5 0.3 - 1.2 mg/dL   GFR calc non Af Amer 59 (L) >60 mL/min   GFR calc Af Amer >60 >60 mL/min   Anion gap 16 (H) 5 - 15    Comment: Performed  at [facility], [place] [address], [place], [place] [address]  Magnesium     Status: None   Collection Time: 12/22/18  5:00 AM  Result Value Ref Range   Magnesium 2.3 1.7 - 2.4 mg/dL    Comment: Performed at [facility], [place] [address] [place], [place] [address]  Phosphorus     Status: Abnormal   Collection Time: 12/22/18  5:00 AM  Result Value Ref Range   Phosphorus 1.4 (L) 2.5 - 4.6 mg/dL    Comment: Performed at [facility] [address], [place], [place] [address]  Troponin I -     Status: None   Collection Time: 12/22/18  5:00 AM  Result Value Ref Range   Troponin I <0.03 <0.03 ng/mL    Comment: Performed at [facility] [address], [place], [place] [address]  Basic metabolic panel     Status: Abnormal   Collection Time: 12/22/18  5:09 AM  Result Value Ref Range   Sodium 122 (L) 135 - 145 mmol/L   Potassium 3.7 3.5 - 5.1 mmol/L   Chloride 93 (L) 98 - 111 mmol/L   CO2 16 (L) 22 - 32 mmol/L   Glucose, Bld 103 (H) 70 - 99 mg/dL   BUN 6 (L) 8 - 23 mg/dL   Creatinine, Ser 0.93 0.44 - 1.00 mg/dL   Calcium 8.5 (L) 8.9 - 10.3 mg/dL   GFR calc non Af Amer >60 >60 mL/min   GFR calc Af Amer >60 >60 mL/min   Anion gap 13 5 - 15    Comment: Performed at [facility]  [address] [place], [place] [address]   Dg Chest 2 View  Result Date: 12/21/2018 CLINICAL DATA:  Chest pain, shortness of breath EXAM: CHEST - 2 VIEW COMPARISON:  10/31/2016 FINDINGS: Heart and mediastinal contours are within normal limits. No focal opacities or effusions. No acute bony abnormality. Old healed left seventh and 8th rib fractures. 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 IMPRESSION: No active cardiopulmonary disease. Electronically Signed   By: Rolm Baptise M.D.   On: 12/21/2018 19:12   Dg Abd 1 View  Result Date: 12/22/2018 CLINICAL DATA:  Constipation EXAM: ABDOMEN - 1 VIEW COMPARISON:  None. FINDINGS: Scattered gas and stool  throughout the colon. No small or large bowel distention. No radiopaque stones. Degenerative changes in the lumbar spine with mild lumbar scoliosis convex towards the right. Calcified phleboliths in the pelvis. IMPRESSION: Nonobstructive bowel gas pattern. Electronically Signed   By: Lucienne Capers M.D.   On: 12/22/2018 01:03   Ct Head Wo Contrast  Result Date: 12/21/2018 CLINICAL DATA:  Headache EXAM: CT HEAD WITHOUT CONTRAST TECHNIQUE: Contiguous axial images were obtained from the base of the skull through the vertex without intravenous contrast. COMPARISON:  None. FINDINGS: Brain: No acute intracranial abnormality. Specifically, no hemorrhage, hydrocephalus, mass lesion, acute infarction, or significant intracranial injury. Vascular: No hyperdense vessel or unexpected calcification. Skull: No acute calvarial abnormality. Sinuses/Orbits: Visualized paranasal sinuses and mastoids clear. Orbital soft tissues unremarkable. Other: None IMPRESSION: No acute intracranial abnormality. Electronically Signed   By: Rolm Baptise M.D.   On: 12/21/2018 19:12    Pending Labs Unresulted Labs (From admission, onward)    Start     Ordered   12/22/18 0998  Basic metabolic panel  Now then every 6 hours,   R     12/21/18 2154   12/21/18 2153  Vitamin B1  Once,   R     12/21/18 2152          Vitals/Pain Today's Vitals   12/22/18 0930 12/22/18 0933 12/22/18 1145 12/22/18 1229  BP: (!) 165/65  139/69   Pulse: 70  65   Resp:   19   Temp:      TempSrc:      SpO2:   100%   Weight:      Height:      PainSc:  7   2     Isolation Precautions No active isolations  Medications Medications  thiamine 555m in normal saline (589m IVPB (0 mg Intravenous Stopped 12/22/18 1010)  nebivolol (BYSTOLIC) tablet 10 mg (10 mg Oral Given 12/22/18 0928)  ALPRAZolam (XANAX) tablet 0.25 mg (0.25 mg Oral Given 12/21/18 2236)  levothyroxine (SYNTHROID, LEVOTHROID) tablet 125 mcg (125 mcg Oral Given 12/22/18 0926)  0.9 %   sodium chloride infusion ( Intravenous Stopped 12/22/18 1051)  feeding supplement (BOOST / RESOURCE BREEZE) liquid 1 Container ( Oral Given 12/21/18 2237)  phosphorus (K PHOS NEUTRAL) tablet 500 mg (500 mg Oral Given 12/22/18 0927)  LORazepam (ATIVAN) injection 2-3 mg (has no administration in time range)  carisoprodol (SOMA) tablet 350 mg (350 mg Oral Given 12/22/18 1006)  lactated ringers bolus 1,000 mL (0 mLs Intravenous Stopped 12/21/18 2134)  potassium chloride SA (K-DUR,KLOR-CON) CR tablet 40 mEq (40 mEq Oral Given 12/21/18 1941)  magnesium sulfate IVPB 2 g 50 mL (0 g Intravenous Stopped 12/21/18 2134)  potassium chloride 10 mEq in 100 mL IVPB (0 mEq Intravenous Stopped 12/22/18 0003)    Mobility walks with device Moderate fall  risk   R Recommendations: See Admitting Provider Note  Report given to:   Additional Notes: CIWA protocol

## 2018-12-22 NOTE — Progress Notes (Signed)
Page TEAM 1 - Stepdown/ICU TEAM  Oval Linsey Cervenka  SKA:[id number] DOB: 11-19-1945 DOA: 12/21/2018 PCP: [name], MD    Brief Narrative:  [phone] w/ a hx of hypertension, hyperlipidemia, mitral valve prolapse, bicuspid aortic valve with mild stenosis, asthma, arthritis, ulcerative colitis, chronic pain, and an ascending aortic aneurysm since 2012 who presented with weakness and fatigue w/ SOB. She admitted she used to be heavy drinker but had not drank for 2 weeks at presentation. Her husband reported very minimal intake of food for over 2 weeks.   Subjective: The patient is alert and conversant though somewhat circumferential in her speech.  I had a lengthy discussion with her and her husband at bedside.  She tells me she is feeling much better.  He agrees that she is much closer to her usual self.  She denies current chest pain or shortness of breath.  She continues to feel weak in general.  She admits that she was taking in very little for many days prior to her admission.  S  Assessment & Plan:  Acute metabolic encephalopathy Multifactorial: severe dehydration, polypharmacy, multiple electrolyte abnormalities -continue to gently hydrate and address electrolyte abnormalities -resume home meds in stepwise fashion and follow mental status  HTN Blood pressure reasonably controlled at this time though not ideal -follow trend  HLD did not tolerate Crestor -hold treatment for now  Hypothyroidism continue Synthroid - TSH at goal   Prolonged QT interval monitor on tele - care w/ med choices   Hyponatremia likely hypovolemic hyponatremia -continue to gently hydrate and follow  Aneurysm of thoracic aorta Will need ongoing outpatient follow-up  Moderate malnutrition in the context of acute illness   Nutrition to meet with patient  Hypokalemia  Corrected   Hypomagnesemia  Corrected to goal  Hypophosphatemia  Cont to supplement   Alcohol abuse husband stated patient  had not been drinking for the past few weeks  DVT prophylaxis: lovenox  Code Status: FULL CODE Family Communication: Spoke with husband at bedside at length Disposition Plan: Discharge home when electrolytes balanced, volume status normalized, and mental status stable  Consultants:  none  Antimicrobials:  none  Objective: Blood pressure 140/69, pulse 67, temperature 98 F (36.7 C), temperature source Oral, resp. rate 18, height 5' 5"  (1.651 m), weight 62 kg, SpO2 100 %.  Intake/Output Summary (Last 24 hours) at 12/22/2018 1514 Last data filed at 12/22/2018 0859 Gross per 24 hour  Intake 50 ml  Output -  Net 50 ml   Filed Weights   12/21/18 1824  Weight: 62 kg    Examination: General: No acute respiratory distress Lungs: Clear to auscultation bilaterally without wheezes or crackles Cardiovascular: Regular rate and rhythm without murmur gallop or rub normal S1 and S2 Abdomen: Nontender, nondistended, soft, bowel sounds positive, no rebound, no ascites, no appreciable mass Extremities: No significant cyanosis, clubbing, or edema bilateral lower extremities  CBC: Recent Labs  Lab 12/21/18 1838 12/21/18 2122 12/22/18 0345  WBC 6.1  --  6.8  NEUTROABS 4.3  --   --   HGB 12.4 10.5* 11.4*  HCT 34.3* 31.0* 31.7*  MCV 90.0  --  92.2  PLT 229  --  597   Basic Metabolic Panel: Recent Labs  Lab 12/21/18 1927 12/21/18 2112  12/22/18 0500 12/22/18 0509 12/22/18 1348  NA  --   --    < > 120* 122* 126*  K  --   --    < > 3.4* 3.7 3.9  CL  --   --    < > 91* 93* 100  CO2  --   --    < > 13* 16* 17*  GLUCOSE  --   --    < > 99 103* 109*  BUN  --   --    < > 7* 6* <5*  CREATININE  --   --    < > 0.95 0.93 0.89  CALCIUM  --   --    < > 8.4* 8.5* 8.5*  MG 1.1*  --   --  2.3  --   --   PHOS  --  1.7*  --  1.4*  --   --    < > = values in this interval not displayed.   GFR: Estimated Creatinine Clearance: 49.9 mL/min (by C-G formula based on SCr of 0.89 mg/dL).  Liver  Function Tests: Recent Labs  Lab 12/21/18 1838 12/22/18 0345 12/22/18 0500  AST 34 29 31  ALT 33 28 26  ALKPHOS 38 37* 37*  BILITOT 0.9 0.9 0.5  PROT 6.5 5.7* 5.9*  ALBUMIN 4.2 3.8 3.9    Recent Labs  Lab 12/22/18 0123  AMMONIA 20    Cardiac Enzymes: Recent Labs  Lab 12/21/18 1838 12/21/18 2146 12/22/18 0345 12/22/18 0500  CKTOTAL 100  --   --   --   TROPONINI <0.03 <0.03 <0.03 <0.03   CBG: Recent Labs  Lab 12/21/18 1838  GLUCAP 119*    Scheduled Meds: . feeding supplement  1 Container Oral TID BM  . levothyroxine  125 mcg Oral Daily  . nebivolol  10 mg Oral Daily  . phosphorus  500 mg Oral BID     LOS: 1 day   Cherene Altes, MD [facility] Office  [phone] Pager - Text Page per Amion  If 7PM-7AM, please contact night-coverage per Amion 12/22/2018, 3:14 PM

## 2018-12-22 NOTE — ED Notes (Signed)
Breakfast Tray Ordered. 

## 2018-12-22 NOTE — ED Notes (Signed)
Patient frequently requests water.

## 2018-12-22 NOTE — ED Notes (Signed)
ECHO at bedside.

## 2018-12-22 NOTE — ED Notes (Signed)
Assisted pt to bedside commode. Patient rambling on about "it's my [birth year][age] birthday", "I don't normally act like this", "I am sorry for the way that I talked to that tech, but her arms were crossed when she talked to me", "I haven't been sick for 20 years" etc. Attempted to console pt.

## 2018-12-22 NOTE — ED Notes (Addendum)
Patient remains anxious, but is no longer hysterical. Continues to hyperventilate. Now c/o pain all over and requesting her night time meds ([place] and [place]). Inpt Md notified of pt status. No new orders received at this time; awaiting new labs.

## 2018-12-22 NOTE — ED Notes (Signed)
Delay in lab draw pt enroute to xray.

## 2018-12-22 NOTE — ED Notes (Signed)
When this nurse changed patient to hospital bed with assist of ED tech, patient complained that tech was barking out orders and lecturing her. This nurse was a witness to the conversation at hand and tech merely requested that pt get out of bed and sit in chair so that we could change out stretcher for bed. Patient then began to state that she has never been spoken so rudely to, and then proceeded to state that inpt admitting dr was also rude. Then pt states that she had been rude to the dr, too. Pt asked tech, "are you a nurse? Then don't tell me what to do". Attempted to comfort pt and let her know that it was not the intent of staff to lecture/bark orders. Pt has intermittently complained about different staff members, doctors at other facilities, other facilities, etc., since her arrival. Pt's husband had even verified with this nurse that his wife had been rude to Dr. Roel Cluck. Will continue to attempt to comfort/redirect pt and assure her that we are here for her.

## 2018-12-22 NOTE — ED Notes (Signed)
Karen(SR)-Lunch Tray Ordered @ 1003-per RN-called by Levada Dy

## 2018-12-22 NOTE — Progress Notes (Signed)
  Echocardiogram 2D Echocardiogram has been performed.  Marissa [name] 12/22/2018, 11:30 AM

## 2018-12-23 LAB — CBC
HCT: 33.1 % — ABNORMAL LOW (ref 36.0–46.0)
Hemoglobin: 11.4 g/dL — ABNORMAL LOW (ref 12.0–15.0)
MCH: 32.8 pg (ref 26.0–34.0)
MCHC: 34.4 g/dL (ref 30.0–36.0)
MCV: 95.1 fL (ref 80.0–100.0)
Platelets: 166 10*3/uL (ref 150–400)
RBC: 3.48 MIL/uL — ABNORMAL LOW (ref 3.87–5.11)
RDW: 12.7 % (ref 11.5–15.5)
WBC: 5 10*3/uL (ref 4.0–10.5)
nRBC: 0 % (ref 0.0–0.2)

## 2018-12-23 LAB — COMPREHENSIVE METABOLIC PANEL
ALT: 24 U/L (ref 0–44)
AST: 25 U/L (ref 15–41)
Albumin: 3.5 g/dL (ref 3.5–5.0)
Alkaline Phosphatase: 35 U/L — ABNORMAL LOW (ref 38–126)
Anion gap: 10 (ref 5–15)
BUN: <5 mg/dL — ABNORMAL LOW (ref 8–23)
CO2: 19 mmol/L — ABNORMAL LOW (ref 22–32)
CREATININE: 0.72 mg/dL (ref 0.44–1.00)
Calcium: 8.3 mg/dL — ABNORMAL LOW (ref 8.9–10.3)
Chloride: 102 mmol/L (ref 98–111)
GFR calc Af Amer: >60 mL/min (ref >60)
GFR calc non Af Amer: >60 mL/min (ref >60)
Glucose, Bld: 92 mg/dL (ref 70–99)
Potassium: 3.7 mmol/L (ref 3.5–5.1)
Sodium: 131 mmol/L — ABNORMAL LOW (ref 135–145)
Total Bilirubin: 0.6 mg/dL (ref 0.3–1.2)
Total Protein: 5.8 g/dL — ABNORMAL LOW (ref 6.5–8.1)

## 2018-12-23 LAB — PHOSPHORUS: PHOSPHORUS: 5.6 mg/dL — AB (ref 2.5–4.6)

## 2018-12-23 LAB — MAGNESIUM: Magnesium: 1.8 mg/dL (ref 1.7–2.4)

## 2018-12-23 MED ORDER — POTASSIUM CHLORIDE CRYS ER 20 MEQ PO TBCR
30.0000 meq | EXTENDED_RELEASE_TABLET | ORAL | Status: AC
  Administered 2018-12-23 (×2): 30 meq via ORAL
  Filled 2018-12-23 (×2): qty 1

## 2018-12-23 MED ORDER — MAGNESIUM SULFATE 2 GM/50ML IV SOLN
2.0000 g | Freq: Once | INTRAVENOUS | Status: AC
  Administered 2018-12-23: 2 g via INTRAVENOUS
  Filled 2018-12-23: qty 50

## 2018-12-23 NOTE — Care Management Note (Addendum)
Case Management Note  Patient Details  Name: Marissa [name] MRN: [id number] Date of Birth: Jan 26, 1946  Subjective/Objective: Pt presented for weakness and fatigue. PTA from home with husband. PT recommendation for SNF. Pt is declining SNF and wants to return home with [facility].                   Action/Plan: Medicare[employer] list provided to patient- and CM will speak with patient on 12-24-18 to get choice.  Expected Discharge Date:                  Expected Discharge Plan:  [place]  In-House Referral:   Clinical Social Worker  Discharge planning Services  CM Consult  Post Acute Care Choice:  Home Health Choice offered to:  Patient  DME Arranged:   N/A DME Agency:   N/A  HH Arranged:  PT, RN, Disease Management, Nurse's Aide, Occupational Therapy  HH Agency:   [place]  Status of Service:  Completed If discussed at [place] of Stay Meetings, dates discussed:    Additional Comments: 1106 12-24-18 Jacqlyn Krauss, RN,BSN [phone] CM did speak with patient and  She chose [facility]. Referral made to [facility] with [facility] and [facility] to begin within 24-48 hours post transition. No further needs from CM at this time.  Bethena Roys, RN 12/23/2018, 3:54 PM

## 2018-12-23 NOTE — Progress Notes (Signed)
Marissa [name]  RWE:[id number] DOB: June 28, 1946 DOA: 12/21/2018 PCP: [name], MD    Brief Narrative:  [phone] w/ a hx of hypertension, hyperlipidemia, mitral valve prolapse, bicuspid aortic valve with mild stenosis, asthma, arthritis, ulcerative colitis, chronic pain, and an ascending aortic aneurysm since 2012 who presented with weakness and fatigue w/ SOB. She admitted she used to be heavy drinker but had not drank for 2 weeks at presentation. Her husband reported very minimal intake of food for over 2 weeks.   Subjective:  She reports is feeling better today, she had better night sleep yesterday, denies any chest pain or shortness of breath,  Assessment & Plan:  Hyponatremia -hypovolemic hyponatremia, significantly low at 118 on admission, accompanied by some altered mental status, it did respond to IV normal saline, it is 131 today, continue with IV fluids at 75 cc/h,  Acute metabolic encephalopathy Multifactorial: severe dehydration, polypharmacy, hyponatremia , till status significantly improved after correction of her hyponatremia, have discussed with husband, mental status significantly improved, but not back at baseline yet .  HTN -Pressure labile, but overall controlled, continue with current regimen with no changes today  HLD did not tolerate Crestor -hold treatment for now  Hypothyroidism continue Synthroid - TSH at goal   Prolonged QT interval -Did improve today after electrolyte replacement, it is 478 today  Aneurysm of thoracic aorta Will need ongoing outpatient follow-up  Moderate malnutrition in the context of acute illness   Nutrition to meet with patient  Hypokalemia  Corrected   Hypomagnesemia  Corrected to goal  Hypophosphatemia  Cont to supplement   Alcohol abuse husband stated patient had not been drinking for the past few weeks  DVT prophylaxis: lovenox  Code Status: FULL CODE Family Communication: Spoke with husband at bedside  at length Disposition Plan: PT recommending SNF, but patient hopes she can go home, hopefully she can be discharged in 1 to 2 days if hyponatremia continues to improve  Consultants:  Psychiatry  Antimicrobials:  none  Objective: Blood pressure 132/72, pulse (!) 59, temperature 97.8 F (36.6 C), temperature source Oral, resp. rate 20, height 5' 5"  (1.651 m), weight 60.1 kg, SpO2 100 %.  Intake/Output Summary (Last 24 hours) at 12/23/2018 1546 Last data filed at 12/23/2018 1200 Gross per 24 hour  Intake 1121.75 ml  Output 50 ml  Net 1071.75 ml   Filed Weights   12/21/18 1824 12/22/18 1342 12/23/18 0322  Weight: 62 kg 62 kg 60.1 kg    Examination:   Awake Alert, Oriented X 3, No new F.N deficits, Normal affect Symmetrical Chest wall movement, Good air movement bilaterally, CTAB RRR,No Gallops,Rubs or new Murmurs, No Parasternal Heave +ve B.Sounds, Abd Soft, No tenderness, No rebound - guarding or rigidity. No Cyanosis, Clubbing or edema, No new Rash or bruise     CBC: Recent Labs  Lab 12/21/18 1838 12/21/18 2122 12/22/18 0345 12/23/18 0430  WBC 6.1  --  6.8 5.0  NEUTROABS 4.3  --   --   --   HGB 12.4 10.5* 11.4* 11.4*  HCT 34.3* 31.0* 31.7* 33.1*  MCV 90.0  --  92.2 95.1  PLT 229  --  177 093   Basic Metabolic Panel: Recent Labs  Lab 12/21/18 1927 12/21/18 2112  12/22/18 0500 12/22/18 0509 12/22/18 1348 12/23/18 0430  NA  --   --    < > 120* 122* 126* 131*  K  --   --    < > 3.4* 3.7  3.9 3.7  CL  --   --    < > 91* 93* 100 102  CO2  --   --    < > 13* 16* 17* 19*  GLUCOSE  --   --    < > 99 103* 109* 92  BUN  --   --    < > 7* 6* <5* <5*  CREATININE  --   --    < > 0.95 0.93 0.89 0.72  CALCIUM  --   --    < > 8.4* 8.5* 8.5* 8.3*  MG 1.1*  --   --  2.3  --   --  1.8  PHOS  --  1.7*  --  1.4*  --   --  5.6*   < > = values in this interval not displayed.   GFR: Estimated Creatinine Clearance: 55.5 mL/min (by C-G formula based on SCr of 0.72  mg/dL).  Liver Function Tests: Recent Labs  Lab 12/21/18 1838 12/22/18 0345 12/22/18 0500 12/23/18 0430  AST 34 29 31 25   ALT 33 28 26 24   ALKPHOS 38 37* 37* 35*  BILITOT 0.9 0.9 0.5 0.6  PROT 6.5 5.7* 5.9* 5.8*  ALBUMIN 4.2 3.8 3.9 3.5    Recent Labs  Lab 12/22/18 0123  AMMONIA 20    Cardiac Enzymes: Recent Labs  Lab 12/21/18 1838 12/21/18 2146 12/22/18 0345 12/22/18 0500  CKTOTAL 100  --   --   --   TROPONINI <0.03 <0.03 <0.03 <0.03   CBG: Recent Labs  Lab 12/21/18 1838  GLUCAP 119*    Scheduled Meds: . cycloSPORINE  1 drop Both Eyes BID  . enoxaparin (LOVENOX) injection  40 mg Subcutaneous Q24H  . estradiol  0.05 mg Transdermal Q Wed  . feeding supplement  1 Container Oral TID BM  . levothyroxine  125 mcg Oral Daily  . multivitamin with minerals  1 tablet Oral Daily  . nebivolol  10 mg Oral Daily  . phosphorus  500 mg Oral BID  . verapamil  180 mg Oral QHS     LOS: 2 days   Phillips Climes, MD [facility] Office  [phone] Pager - Text Page per Shea Evans  If 7PM-7AM, please contact night-coverage per Amion 12/23/2018, 3:46 PM

## 2018-12-23 NOTE — Plan of Care (Signed)

## 2018-12-23 NOTE — Evaluation (Signed)
Occupational Therapy Evaluation Patient Details Name: Marissa [name] MRN: [id number] DOB: [birth year] Today's Date: 12/23/2018    History of Present Illness Patient is 73 y/o female presenting to hospital with generalized weakness and SOB. Patient with acute metabolic encephalopathy and hyponatremia likely due to deydration and polypharamacy, and electrolyte abnormalities. PMH includes HTN, anemia, TAA, asthma, and HLD.    Clinical Impression   Pt PTA: living with spouse. Appeared to be oriented to person, place and time, not aware of situation. Pt currently limited by inattention and decreased short term memory. Pt able to stay on topic for conversation for a few minutes only. Pt navigating and problem solving through hallways with RW with minguardA for balance. Pt tolerating session well. Pt requires supervision to minA for ADL at this time. OT would benefit pt for ADL, mobility and safety in SNF setting. OT to follow acutely.    Follow Up Recommendations  SNF;Supervision/Assistance - 24 hour    Equipment Recommendations  None recommended by OT    Recommendations for Other Services       Precautions / Restrictions Precautions Precautions: Fall Precaution Comments: Patient reports multiple syncope episodes at home Restrictions Weight Bearing Restrictions: No      Mobility Bed Mobility Overal bed mobility: Needs Assistance Bed Mobility: Supine to Sit;Sit to Supine     Supine to sit: Supervision Sit to supine: Supervision   General bed mobility comments: Patient required supervision for all bed mobility for safety.   Transfers Overall transfer level: Needs assistance Equipment used: Rolling walker (2 wheeled) Transfers: Sit to/from Stand Sit to Stand: Min guard         General transfer comment: Patient required min guard to stand with use of RW for safety. Verbal cues to not push up from RW however patient ignored cues.     Balance Overall balance assessment: Needs  assistance Sitting-balance support: Feet supported;No upper extremity supported Sitting balance-Leahy Scale: Good     Standing balance support: Bilateral upper extremity supported Standing balance-Leahy Scale: Poor Standing balance comment: reliant on BUE support to maintain standing balance                           ADL either performed or assessed with clinical judgement   ADL                                               Vision         Perception     Praxis      Pertinent Vitals/Pain Faces Pain Scale: No hurt     Hand Dominance     Extremity/Trunk Assessment         Cervical / Trunk Assessment Cervical / Trunk Assessment: Normal   Communication Communication Communication: No difficulties   Cognition Arousal/Alertness: Awake/alert Behavior During Therapy: WFL for tasks assessed/performed Overall Cognitive Status: No family/caregiver present to determine baseline cognitive functioning                                 General Comments: Patient with difficulty staying on task. Patient very tangential during conversation. Patient easily distracted and would change topic of conversation sporadically.    General Comments       Exercises     Shoulder  Instructions      Home Living Family/patient expects to be discharged to:: Private residence Living Arrangements: Spouse/significant other Available Help at Discharge: Family;Available PRN/intermittently Type of Home: House Home Access: Stairs to enter [employer] of Steps: 3 Entrance Stairs-Rails: None Home Layout: Two level;Able to live on main level with bedroom/bathroom     Bathroom Shower/Tub: Tub/shower unit   Bathroom Toilet: Handicapped height     Home Equipment: Environmental consultant - 4 wheels;Bedside commode          Prior Functioning/Environment Level of Independence: Needs assistance  Gait / Transfers Assistance Needed: Reports she has been using  rollator for mobility last 2 weeks ADL's / Homemaking Assistance Needed: States husband has assisted with showering the last week            OT Problem List: Decreased strength;Impaired balance (sitting and/or standing);Decreased safety awareness;Decreased coordination;Pain      OT Treatment/Interventions: Self-care/ADL training;Therapeutic exercise;Neuromuscular education;Energy conservation;Therapeutic activities;Patient/family education;Balance training    OT Goals(Current goals can be found in the care plan section) Acute Rehab OT Goals Patient Stated Goal: feel better OT Goal Formulation: With patient Time For Goal Achievement: 01/06/19 Potential to Achieve Goals: Good ADL Goals Pt Will Perform Lower Body Dressing: with modified independence Pt Will Transfer to Toilet: with modified independence  OT Frequency: Min 2X/week   Barriers to D/C:            Co-evaluation              AM-PAC OT "6 Clicks" Daily Activity     Outcome Measure Help from another person eating meals?: None Help from another person taking care of personal grooming?: A Little Help from another person toileting, which includes using toliet, bedpan, or urinal?: A Little Help from another person bathing (including washing, rinsing, drying)?: A Little Help from another person to put on and taking off regular upper body clothing?: A Little Help from another person to put on and taking off regular lower body clothing?: A Little 6 Click Score: 19   End of Session Equipment Utilized During Treatment: Gait belt;Rolling walker Nurse Communication: Mobility status  Activity Tolerance: Patient tolerated treatment well Patient left: in chair;with call bell/phone within reach;with chair alarm set  OT Visit Diagnosis: Unsteadiness on feet (R26.81);Muscle weakness (generalized) (M62.81);Dizziness and giddiness (R42)                Time: 5638-9373 OT Time Calculation (min): 21 min Charges:  OT General  Charges $OT Visit: 1 Visit OT Evaluation $OT Eval Moderate Complexity: 1 Mod  Darryl Nestle) Marsa Aris OTR/L Acute Rehabilitation Services Pager: [phone] Office: [phone]  Fredda Hammed 12/23/2018, 6:05 PM

## 2018-12-23 NOTE — Clinical Social Work Note (Signed)
Clinical Social Work Assessment  Patient Details  Name: Marissa [name] MRN: [id number] Date of Birth: 21-Dec-1945  Date of referral:  12/23/18               Reason for consult:  Facility Placement, Discharge Planning                Permission sought to share information with:  Family Supports Permission granted to share information::  Yes, Verbal Permission Granted  Name::     Marissa [name]  Agency::     Relationship::  spouse  Contact Information:  [phone]  Housing/Transportation Living arrangements for the past 2 months:  [place] of Information:  Patient, Spouse Patient Interpreter Needed:  None Criminal Activity/Legal Involvement Pertinent to Current Situation/Hospitalization:  No - Comment as needed Significant Relationships:  Spouse Lives with:  Spouse Do you feel safe going back to the place where you live?  Yes Need for family participation in patient care:  Yes (Comment)  Care giving concerns: Patient from home with spouse. PT recommending SNF.   Social Worker assessment / plan: CSW and RNCM met with patient and spouse at bedside to discuss disposition - PT recommendation for SNF. Patient alert and oriented.  CSW explained recommendations for SNF. Patient and husband reported that patient feeling much better today and hopeful to get up and walk with PT. Patient and husband do not want her to go to SNF. Husband is retired and can provide 24 hour support at home. They would prefer home health therapy.  CSW will follow for continued PT recommendations.  Employment status:  Retired Primary school teacher) PT Recommendations:  [place] / Referral to community resources:  [place]  Patient/Family's Response to care: Patient and spouse appreciative of care.  Patient/Family's Understanding of and Emotional Response to Diagnosis, Current Treatment, and Prognosis: Patient and  family with good understanding of patient's conditions. They prefer for patient to return home at discharge.  Emotional Assessment Appearance:  Appears stated age Attitude/Demeanor/Rapport:  Engaged Affect (typically observed):  Accepting, Calm, Pleasant, Appropriate Orientation:  Oriented to Self, Oriented to Place, Oriented to  Time, Oriented to Situation Alcohol / Substance use:  Not Applicable Psych involvement (Current and /or in the community):  No (Comment)  Discharge Needs  Concerns to be addressed:  Discharge Planning Concerns, Care Coordination Readmission within the last 30 days:  No Current discharge risk:  Physical Impairment Barriers to Discharge:  Continued Medical Work up, [place], LCSW 12/23/2018, 1:56 PM

## 2018-12-24 ENCOUNTER — Telehealth: Payer: Self-pay | Admitting: *Deleted

## 2018-12-24 LAB — BASIC METABOLIC PANEL WITH GFR
Anion gap: 9 (ref 5–15)
BUN: <5 mg/dL — ABNORMAL LOW (ref 8–23)
CO2: 19 mmol/L — ABNORMAL LOW (ref 22–32)
Calcium: 8.4 mg/dL — ABNORMAL LOW (ref 8.9–10.3)
Chloride: 104 mmol/L (ref 98–111)
Creatinine, Ser: 0.72 mg/dL (ref 0.44–1.00)
GFR calc Af Amer: >60 mL/min
GFR calc non Af Amer: >60 mL/min
Glucose, Bld: 103 mg/dL — ABNORMAL HIGH (ref 70–99)
Potassium: 4.4 mmol/L (ref 3.5–5.1)
Sodium: 132 mmol/L — ABNORMAL LOW (ref 135–145)

## 2018-12-24 MED ORDER — VALSARTAN 80 MG PO TABS: 80.0000 mg | ORAL_TABLET | Freq: Every day | ORAL | 0 refills | Status: DC

## 2018-12-24 MED ORDER — ADULT MULTIVITAMIN W/MINERALS CH: 1.0000 | ORAL_TABLET | Freq: Every day | ORAL | 0 refills | Status: DC

## 2018-12-24 NOTE — Discharge Instructions (Signed)
Follow with Primary MD [name], MD in 7 days   Get CBC, CMP,  checked  by Primary MD next visit.    Activity: As tolerated with Full fall precautions use walker/cane & assistance as needed   Disposition Home    Diet: Heart Healthy  , with feeding assistance and aspiration precautions.   On your next visit with your primary care physician please Get Medicines reviewed and adjusted.   Please request your Prim.MD to go over all Hospital Tests and Procedure/Radiological results at the follow up, please get all Hospital records sent to your Prim MD by signing hospital release before you go home.   If you experience worsening of your admission symptoms, develop shortness of breath, life threatening emergency, suicidal or homicidal thoughts you must seek medical attention immediately by calling 911 or calling your MD immediately  if symptoms less severe.  You Must read complete instructions/literature along with all the possible adverse reactions/side effects for all the Medicines you take and that have been prescribed to you. Take any new Medicines after you have completely understood and accpet all the possible adverse reactions/side effects.   Do not drive, operating heavy machinery, perform activities at heights, swimming or participation in water activities or provide baby sitting services if your were admitted for syncope or siezures until you have seen by Primary MD or a Neurologist and advised to do so again.  Do not drive when taking Pain medications.    Do not take more than prescribed Pain, Sleep and Anxiety Medications  Special Instructions: If you have smoked or chewed Tobacco  in the last 2 yrs please stop smoking, stop any regular Alcohol  and or any Recreational drug use.  Wear Seat belts while driving.   Please note  You were cared for by a hospitalist during your hospital stay. If you have any questions about your discharge medications or the care you  received while you were in the hospital after you are discharged, you can call the unit and asked to speak with the hospitalist on call if the hospitalist that took care of you is not available. Once you are discharged, your primary care physician will handle any further medical issues. Please note that NO REFILLS for any discharge medications will be authorized once you are discharged, as it is imperative that you return to your primary care physician (or establish a relationship with a primary care physician if you do not have one) for your aftercare needs so that they can reassess your need for medications and monitor your lab values.

## 2018-12-24 NOTE — Discharge Summary (Signed)
Marissa [name], is a 73 y.o. female  DOB 1946/09/27  MRN [id number].  Admission date:  12/21/2018  Admitting Physician  Toy Baker, MD  Discharge Date:  12/24/2018   Primary MD  [name], MD  Recommendations for primary care physician for things to follow:  -Check CBC, BMP during next visit   Admission Diagnosis  Hypomagnesemia [E83.42] Hyponatremia [E87.1] Constipation [K59.00]   Discharge Diagnosis  Hypomagnesemia [E83.42] Hyponatremia [E87.1] Constipation [K59.00]    Principal Problem:   Anxiety Active Problems:   Hypothyroidism   ANEMIA, IRON DEFICIENCY   Aneurysm of thoracic aorta (HCC)   Asthma   B12 deficiency   Essential hypertension   Hyperlipidemia with target LDL less than 100   Prolonged QT interval   Hyponatremia   Chest pain   Malnutrition (HCC)   Hypokalemia   Hypomagnesemia   Hypophosphatemia   Acute metabolic encephalopathy   Alkalosis, metabolic   Alcohol abuse   Malnutrition of moderate degree      Past Medical History:  Diagnosis Date  . Arthritis   . Asthma   . Bicuspid aortic valve 05/2017   Likely functional bicuspid aortic valve with sclerosis and no stenosis.  . Bursitis   . Cataract    mild  . Dysthymia   . Hemorrhoids   . Hx of ulcerative colitis    per dr Arnoldo Morale as per pt.  . Hyperlipidemia   . Hypertension    managed - labile.  . Migraine   . Scoliosis   . 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 Spinal stenosis   . Thoracic aortic aneurysm (HCC)    Stable 4.2-4.3 cm (followed by Dr. Roxan Hockey)  . Thyroid disease     Past Surgical History:  Procedure Laterality Date  . ABDOMINAL HYSTERECTOMY    . BUNIONECTOMY    . Cardiac Event Monitor  07/2017   Overall relatively normal.  Normal sinus  rhythm with rare bradycardia and tachycardia.  Heart rate ranged from 55-110 bpm.  Occasional PACs and PVCs, every single 1 was felt.  No arrhythmias other than one short 4 beat run of PACs.  . COLONOSCOPY  02-04-2005   all normal   . FOOT SURGERY     3 pins in toes   . HAND SURGERY     left thumb joint resection  . nasal revision    . NM MYOVIEW LTD  07/2017   LOW RISK study.  No ischemia or infarction.  EF greater than 65%.   [name] Kitchen SINUS IRRIGATION    . TONSILLECTOMY AND ADENOIDECTOMY    . TRANSTHORACIC ECHOCARDIOGRAM  06/'1, 8/'19   a) Moderate LVH. Normal EF 60-65%. Normal diastolic parameters. --> Difficult to fully visualize the aortic valve. Cannot exclude bicuspid valve. Mild aortic stenosis noted. No PFO. Mildly dilated left atrium. Trivial MR. No comment on mitral valve prolapse. Moderately dilated  ascending aorta.;; b) F/u Echo To evaluate the aortic valve. -- Bicuspid AoV - mildly thickened / calcified. - No stenosis       History of present illness and  Hospital Course:     Kindly see H&P for history of present illness and admission details, please review complete Labs, Consult reports and Test reports for all details in brief  HPI  from the history and physical done on the day of admission 12/21/2018  Marissa [name] is a 73 y.o. female with medical history significant of hypertension, hyperlipidemia, mitral valve prolapse, bicuspid aortic valve with mild stenosis, asthma, arthritis, ulcerative colitis, chronic pain, and an ascending aneurysm that been following since 2012    Presented with weak  Not feeling well Have been so fatigue had to use a walker Now very short of breath and have had a some  She has been drinking large amount of water have been feeling very thirsty   She had an episode of nausea and vomiting last week  Had a bit of chest pain  She has been out of balance since she started on Crestor She has had poor appetite  She has lost some weight over  past 3 wks  Has been more confused And states that she used to be heavy drinker but has not drank for the past 2 weeks. Difficult to obtain patient's history as she is in a rambling tearful and attacking her husband verbally throughout conversation. Husband states that she has not been eating well for quite some time only pecks a little bit here and there She has been preoccupied with constipation and has been taking multiple medications to help her have a bowel movement.. Denies any excessive aspirin acetaminophen intake Regarding pertinent Chronic problems: aortic aneurism by cardiology Has hx of chronic pain no longer on Demerol   Hospital Course   73yo w/ a hx of hypertension, hyperlipidemia, mitral valve prolapse, bicuspid aortic valve with mild stenosis, asthma, arthritis, ulcerative colitis,chronic pain, and an ascending aortic aneurysm since 2012 who presented withweakness and fatigue w/ SOB. She admitted she used to be heavy drinker but had not drank for 2 weeks at presentation. Her husband reported very minimal intake of food for over 2 weeks.   Hyponatremia -hypovolemic hyponatremia, significantly low at 118 on admission, accompanied by some altered mental status, it did respond to IV normal saline, sodium is 132 at time of discharge, mental status has improved and currently she is back to baseline per husband.   Acute metabolic encephalopathy Multifactorial: severedehydration, polypharmacy, hyponatremia , till status significantly improved after correction of her hyponatremia,  -Resolved, mental status back to baseline  HTN -blood Pressure labile, but overall controlled, continue with current regimen on verapamil and Bystolic, as blood pressure has been controlled on this regimen for last couple days, valsartan dose has been lowered from 320 mg daily to 80 mg daily, and Aldactone has been stopped on discharge, to continue with her clonidine on as-needed basis.  HLD did  not tolerate Crestor -hold treatment for now  Hypothyroidism continue Synthroid - TSH at goal   Prolonged QT interval -Did improve today after electrolyte replacement, was recent was 478 on EKG 12/23/2018  Aneurysm of thoracic aorta Is being followed by cardiology and CT surgery as an outpatient  Moderate malnutrition in the context of acute illness   Was encouraged to increase her intake at home, and to drink Ensure daily  Hypokalemia Corrected   Hypomagnesemia Corrected to goal  Hypophosphatemia  repleted   Alcohol abuse husband stated patient had not been drinking for the past few weeks   Discharge Condition:  stable   Follow UP  Follow-up Information    [name], MD Follow up in 1 week(s).   Specialty:  Internal Medicine Contact information: [place] [address] [phone]        Care, [facility] Follow up.   Specialty:  Home Health Services Why:  Registered Nurse, Physical Therapy, Occupational Therapy, Aide  Contact information: [address] [place] [address] [phone]             Discharge Instructions  and  Discharge Medications    Discharge Instructions    Discharge instructions   Complete by:  As directed    Follow with Primary MD [name], MD in 7 days   Get CBC, CMP,  checked  by Primary MD next visit.    Activity: As tolerated with Full fall precautions use walker/cane & assistance as needed   Disposition Home    Diet: Heart Healthy  , with feeding assistance and aspiration precautions.   On your next visit with your primary care physician please Get Medicines reviewed and adjusted.   Please request your Prim.MD to go over all Hospital Tests and Procedure/Radiological results at the follow up, please get all Hospital records sent to your Prim MD by signing hospital release before you go home.   If you experience worsening of your  admission symptoms, develop shortness of breath, life threatening emergency, suicidal or homicidal thoughts you must seek medical attention immediately by calling 911 or calling your MD immediately  if symptoms less severe.  You Must read complete instructions/literature along with all the possible adverse reactions/side effects for all the Medicines you take and that have been prescribed to you. Take any new Medicines after you have completely understood and accpet all the possible adverse reactions/side effects.   Do not drive, operating heavy machinery, perform activities at heights, swimming or participation in water activities or provide baby sitting services if your were admitted for syncope or siezures until you have seen by Primary MD or a Neurologist and advised to do so again.  Do not drive when taking Pain medications.    Do not take more than prescribed Pain, Sleep and Anxiety Medications  Special Instructions: If you have smoked or chewed Tobacco  in the last 2 yrs please stop smoking, stop any regular Alcohol  and or any Recreational drug use.  Wear Seat belts while driving.   Please note  You were cared for by a hospitalist during your hospital stay. If you have any questions about your discharge medications or the care you received while you were in the hospital after you are discharged, you can call the unit and asked to speak with the hospitalist on call if the hospitalist that took care of you is not available. Once you are discharged, your primary care physician will handle any further medical issues. Please note that NO REFILLS for any discharge medications will be authorized once you are discharged, as it is imperative that you return to your primary care physician (or establish a relationship with a primary care physician if you do not have one) for your aftercare needs so that they can reassess your need for medications and monitor your lab values.   Increase activity  slowly   Complete by:  As directed      Allergies as  of 12/24/2018      Reactions   Fentanyl Shortness Of Breath, Rash   Amoxicillin Diarrhea   Severe diarrhea, rash to vaginal area with swelling   Hctz [hydrochlorothiazide] Other (See Comments)   HypoNatremia   Hydralazine    Weak, sweats, red skin   Codeine Other (See Comments)   makes her hyper   Conjugated Estrogens Itching, Rash   Erythromycin Rash   had a rash with emycin, has done ok with other meds in it's class   Piroxicam Itching, Rash   Feldene      Medication List    STOP taking these medications   ALPRAZolam 0.25 MG tablet Commonly known as:  XANAX   rosuvastatin 10 MG tablet Commonly known as:  CRESTOR   spironolactone 25 MG tablet Commonly known as:  ALDACTONE     TAKE these medications   albuterol 108 (90 Base) MCG/ACT inhaler Commonly known as:  PROAIR HFA INHALE 2 PUFFS INTO THE LUNGS EVERY 6 (SIX) HOURS AS NEEDED. What changed:    how much to take  how to take this  when to take this  reasons to take this  additional instructions   carisoprodol 350 MG tablet Commonly known as:  SOMA TAKE 1 TABLET(350 MG) BY MOUTH THREE TIMES DAILY AS NEEDED What changed:    how much to take  how to take this  when to take this  reasons to take this  additional instructions   cloNIDine 0.1 MG tablet Commonly known as:  CATAPRES TAKE 1 TABLET BY MOUTH THREE TIMES DAILY AS NEEDED. TAKE ONLY AS NEEDED FOR SYSTOLIC BLOOD PRESSURE ABOVE 160 What changed:  See the new instructions.   cycloSPORINE 0.05 % ophthalmic emulsion Commonly known as:  RESTASIS Place 1 drop into both eyes 2 (two) times daily.   Eszopiclone 3 MG Tabs TAKE 1 TABLET BY MOUTH IMMEDIATELY BEFORE BEDTIME What changed:  See the new instructions.   famotidine 20 MG tablet Commonly known as:  PEPCID Take 20 mg by mouth at bedtime.   FLUoxetine 40 MG capsule Commonly known as:  PROZAC TAKE 1 CAPSULE(40 MG) BY MOUTH TWICE  DAILY What changed:  See the new instructions.   FLUTTER Devi Use as directed   levothyroxine 125 MCG tablet Commonly known as:  SYNTHROID, LEVOTHROID TAKE 1 TABLET(125 MCG) BY MOUTH DAILY What changed:  See the new instructions.   MINIVELLE 0.05 MG/24HR patch Generic drug:  estradiol Place 1 patch onto the skin 2 (two) times a week.   montelukast 10 MG tablet Commonly known as:  SINGULAIR TAKE 1 TABLET(10 MG) BY MOUTH AT BEDTIME What changed:  See the new instructions.   multivitamin with minerals Tabs tablet Take 1 tablet by mouth daily.   MYRBETRIQ 50 MG Tb24 tablet Generic drug:  mirabegron ER TAKE 1 TABLET(50 MG) BY MOUTH DAILY What changed:  See the new instructions.   nebivolol 10 MG tablet Commonly known as:  BYSTOLIC Take 1 tablet (10 mg total) by mouth daily. y   valsartan 80 MG tablet Commonly known as:  DIOVAN Take 1 tablet (80 mg total) by mouth daily. What changed:    medication strength  See the new instructions.   verapamil 180 MG CR tablet Commonly known as:  CALAN-SR Take 1 tablet (180 mg total) by mouth at bedtime.         Diet and Activity recommendation: See Discharge Instructions above   Consults obtained -  Psychiatry   Major procedures and Radiology Reports -  PLEASE review detailed and final reports for all details, in brief -      Dg Chest 2 View  Result Date: 12/21/2018 CLINICAL DATA:  Chest pain, shortness of breath EXAM: CHEST - 2 VIEW COMPARISON:  10/31/2016 FINDINGS: Heart and mediastinal contours are within normal limits. No focal opacities or effusions. No acute bony abnormality. Old healed left seventh and 8th rib fractures. 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 IMPRESSION: No active cardiopulmonary disease. Electronically Signed   By: Rolm Baptise M.D.   On: 12/21/2018 19:12   Dg Abd 1 View  Result Date: 12/22/2018 CLINICAL DATA:  Constipation EXAM: ABDOMEN - 1 VIEW COMPARISON:   None. FINDINGS: Scattered gas and stool throughout the colon. No small or large bowel distention. No radiopaque stones. Degenerative changes in the lumbar spine with mild lumbar scoliosis convex towards the right. Calcified phleboliths in the pelvis. IMPRESSION: Nonobstructive bowel gas pattern. Electronically Signed   By: Lucienne Capers M.D.   On: 12/22/2018 01:03   Ct Head Wo Contrast  Result Date: 12/21/2018 CLINICAL DATA:  Headache EXAM: CT HEAD WITHOUT CONTRAST TECHNIQUE: Contiguous axial images were obtained from the base of the skull through the vertex without intravenous contrast. COMPARISON:  None. FINDINGS: Brain: No acute intracranial abnormality. Specifically, no hemorrhage, hydrocephalus, mass lesion, acute infarction, or significant intracranial injury. Vascular: No hyperdense vessel or unexpected calcification. Skull: No acute calvarial abnormality. Sinuses/Orbits: Visualized paranasal sinuses and mastoids clear. Orbital soft tissues unremarkable. Other: None IMPRESSION: No acute intracranial abnormality. Electronically Signed   By: Rolm Baptise M.D.   On: 12/21/2018 19:12    Micro Results    No results found for this or any previous visit (from the past 240 hour(s)).     Today   Subjective:   Marissa [name] today has no headache,no chest abdominal pain,no new weakness tingling or numbness, feels much better wants to go home today.   Objective:   Blood pressure (!) 143/61, pulse 63, temperature 98 F (36.7 C), temperature source Oral, resp. rate 17, height 5' 5"  (1.651 m), weight 61.9 kg, SpO2 98 %.   Intake/Output Summary (Last 24 hours) at 12/24/2018 1130 Last data filed at 12/24/2018 1103 Gross per 24 hour  Intake 1631.36 ml  Output -  Net 1631.36 ml    Exam Awake Alert, Oriented x 3, No new F.N deficits, Normal affect Symmetrical Chest wall movement, Good air movement bilaterally, CTAB RRR,No Gallops,Rubs or new Murmurs, No Parasternal Heave +ve B.Sounds, Abd  Soft, Non tender, No rebound -guarding or rigidity. No Cyanosis, Clubbing or edema, No new Rash or bruise  Data Review   CBC w Diff:  Lab Results  Component Value Date   WBC 5.0 12/23/2018   HGB 11.4 (L) 12/23/2018   HCT 33.1 (L) 12/23/2018   PLT 166 12/23/2018   LYMPHOPCT 18 12/21/2018   MONOPCT 10 12/21/2018   EOSPCT 1 12/21/2018   BASOPCT 0 12/21/2018    CMP:  Lab Results  Component Value Date   NA 132 (L) 12/24/2018   NA 131 (L) 12/01/2018   K 4.4 12/24/2018   CL 104 12/24/2018   CO2 19 (L) 12/24/2018   BUN <5 (L) 12/24/2018   BUN 9 12/01/2018   CREATININE 0.72 12/24/2018   CREATININE 0.47 (L) 03/07/2016   PROT 5.8 (L) 12/23/2018   PROT 6.8 12/01/2018   ALBUMIN 3.5 12/23/2018   ALBUMIN 4.6 12/01/2018   BILITOT 0.6 12/23/2018   BILITOT 0.5 12/01/2018  ALKPHOS 35 (L) 12/23/2018   AST 25 12/23/2018   ALT 24 12/23/2018  .   Total Time in preparing paper work, data evaluation and todays exam - 73 minutes  Phillips Climes M.D on 12/24/2018 at 11:30 AM  [facility]   Office  [phone]

## 2018-12-24 NOTE — Progress Notes (Signed)
Physical Therapy Treatment Patient Details Name: Marissa [name] MRN: [id number] DOB: 1945-10-29 Today's Date: 12/24/2018    History of Present Illness Patient is 73 y/o female presenting to hospital with generalized weakness and SOB. Patient with acute metabolic encephalopathy and hyponatremia likely due to deydration and polypharamacy, and electrolyte abnormalities. PMH includes HTN, anemia, TAA, asthma, and HLD.     PT Comments    Patient seen for mobility progression. Patient received in restroom with patient standing at sink performing hand washing without LOB or instability - able to walk short distance without AD. Patient ambulating in hallway with RW for >400 feet with min guard to supervision for safety - no LOB or need for physical assist. Updated d/c recs as patient has clinically progressed. Will recommend HHPT at discharge. Husband present during session and ordering shower seat for home use. Will follow acutely.     Follow Up Recommendations  Home health PT;Supervision/Assistance - 24 hour     Equipment Recommendations  None recommended by PT    Recommendations for Other Services       Precautions / Restrictions Precautions Precautions: Fall Precaution Comments: Patient reports multiple syncope episodes at home Restrictions Weight Bearing Restrictions: No    Mobility  Bed Mobility               General bed mobility comments: up in restroom  Transfers Overall transfer level: Needs assistance Equipment used: Rolling walker (2 wheeled) Transfers: Sit to/from Stand Sit to Stand: Min guard         General transfer comment: min guard throughout for safety; no LOB  Ambulation/Gait Ambulation/Gait assistance: Min guard Gait Distance (Feet): 400 Feet Assistive device: Rolling walker (2 wheeled) Gait Pattern/deviations: Step-through pattern;Decreased stride length;Trunk flexed;Drifts right/left Gait velocity: varying   General Gait Details: required use  of RW for stability; some cueing needed for obstacle navigation; cueing to remain on task as patient is very talkative   [place] (Stroke Patients Only)       Balance Overall balance assessment: Needs assistance Sitting-balance support: Feet supported;No upper extremity supported Sitting balance-Leahy Scale: Good     Standing balance support: Bilateral upper extremity supported Standing balance-Leahy Scale: Fair                              Cognition Arousal/Alertness: Awake/alert Behavior During Therapy: WFL for tasks assessed/performed Overall Cognitive Status: No family/caregiver present to determine baseline cognitive functioning                                 General Comments: Patient very talkative in session - all appropriate conversation      Exercises      General Comments        Pertinent Vitals/Pain Pain Assessment: No/denies pain Faces Pain Scale: No hurt    Home Living                      Prior Function            PT Goals (current goals can now be found in the care plan section) Acute Rehab PT Goals Patient Stated Goal: feel better PT Goal Formulation: With patient Time For Goal Achievement: 01/05/19 Potential to Achieve Goals: Fair Progress towards PT goals: Progressing  toward goals    Frequency    Min 3X/week      PT Plan Discharge plan needs to be updated;Frequency needs to be updated    Co-evaluation              AM-PAC PT "6 Clicks" Mobility   Outcome Measure  Help needed turning from your back to your side while in a flat bed without using bedrails?: None Help needed moving from lying on your back to sitting on the side of a flat bed without using bedrails?: None Help needed moving to and from a bed to a chair (including a wheelchair)?: A Little Help needed standing up from a chair using your arms (e.g., wheelchair or bedside  chair)?: A Little Help needed to walk in hospital room?: A Little Help needed climbing 3-5 steps with a railing? : A Little 6 Click Score: 20    End of Session Equipment Utilized During Treatment: Gait belt Activity Tolerance: Patient tolerated treatment well Patient left: in bed;with call bell/phone within reach;with family/visitor present Nurse Communication: Mobility status PT Visit Diagnosis: Unsteadiness on feet (R26.81);Muscle weakness (generalized) (M62.81);History of falling (Z91.81)     Time: 6728-9791 PT Time Calculation (min) (ACUTE ONLY): 19 min  Charges:  $Gait Training: 8-22 mins                     Lanney Gins, PT, DPT Supplemental Physical Therapist 12/24/18 10:17 AM Pager: [phone] Office: [phone]

## 2018-12-24 NOTE — Telephone Encounter (Signed)
Transition Care Management Follow-up Telephone Call   Date discharged? 12/24/18   How have you been since you were released from the hospital? Pt states she is ok...just got home not to long ago. Does not have a lot of strength... feel weak but will take it easy to try to get back to normal   Do you understand why you were in the hospital? YES, she states since she has not had her labs check her electrolytes was low. Wish that the MD will check her labs more often due to her history   Do you understand the discharge instructions? YES   Where were you discharged to? Home   Items Reviewed:  Medications reviewed: YES, Until she see provider hospitalist stop her Xanax, Crestor, and spironlactone   Allergies reviewed: YES  Dietary changes reviewed: YES, heart healthy  Referrals reviewed: YES, she states she has not received call from anyone w/home health. She was having rehab in the hospital, and they was going to have someone to continue rehab at home, and help with getting dress since she is off balance    Functional Questionnaire:   Activities of Daily Living (ADLs):   She states she are independent in the following: feeding, continence, grooming and toileting States they require assistance with the following: ambulation, bathing and hygiene and dressing she states my husband will be able to drive her since she is not able to  drive   Any transportation issues/concerns?: NO   Any patient concerns? YES, want to discuss having standard labs done to see how her electrolytes are. Inform pot when se come in for her hosp f/u can discuss at that time   Confirmed importance and date/time of follow-up visits scheduled YES, 12/31/18  Provider Appointment booked with Dr. [name]  Confirmed with patient if condition begins to worsen call PCP or go to the ER.  Patient was given the office number and encouraged to call back with question or concerns.  : YES

## 2018-12-25 ENCOUNTER — Other Ambulatory Visit: Payer: Self-pay | Admitting: Cardiology

## 2018-12-25 LAB — VITAMIN B1: Vitamin B1 (Thiamine): 94 nmol/L (ref 66.5–200.0)

## 2018-12-26 DIAGNOSIS — E871 Hypo-osmolality and hyponatremia: Secondary | ICD-10-CM | POA: Diagnosis not present

## 2018-12-26 DIAGNOSIS — M19041 Primary osteoarthritis, right hand: Secondary | ICD-10-CM | POA: Diagnosis not present

## 2018-12-26 DIAGNOSIS — M4800 Spinal stenosis, site unspecified: Secondary | ICD-10-CM | POA: Diagnosis not present

## 2018-12-26 DIAGNOSIS — I051 Rheumatic mitral insufficiency: Secondary | ICD-10-CM | POA: Diagnosis not present

## 2018-12-26 DIAGNOSIS — F419 Anxiety disorder, unspecified: Secondary | ICD-10-CM | POA: Diagnosis not present

## 2018-12-26 DIAGNOSIS — F1010 Alcohol abuse, uncomplicated: Secondary | ICD-10-CM | POA: Diagnosis not present

## 2018-12-26 DIAGNOSIS — Q231 Congenital insufficiency of aortic valve: Secondary | ICD-10-CM | POA: Diagnosis not present

## 2018-12-26 DIAGNOSIS — J45909 Unspecified asthma, uncomplicated: Secondary | ICD-10-CM | POA: Diagnosis not present

## 2018-12-26 DIAGNOSIS — I119 Hypertensive heart disease without heart failure: Secondary | ICD-10-CM | POA: Diagnosis not present

## 2018-12-26 DIAGNOSIS — M5416 Radiculopathy, lumbar region: Secondary | ICD-10-CM | POA: Diagnosis not present

## 2018-12-26 DIAGNOSIS — K5900 Constipation, unspecified: Secondary | ICD-10-CM | POA: Diagnosis not present

## 2018-12-26 DIAGNOSIS — E039 Hypothyroidism, unspecified: Secondary | ICD-10-CM | POA: Diagnosis not present

## 2018-12-26 DIAGNOSIS — I712 Thoracic aortic aneurysm, without rupture: Secondary | ICD-10-CM | POA: Diagnosis not present

## 2018-12-26 DIAGNOSIS — E440 Moderate protein-calorie malnutrition: Secondary | ICD-10-CM | POA: Diagnosis not present

## 2018-12-26 DIAGNOSIS — E538 Deficiency of other specified B group vitamins: Secondary | ICD-10-CM | POA: Diagnosis not present

## 2018-12-28 NOTE — Telephone Encounter (Signed)
Th is the message I sent to patient  Mrs Postell, I am sorry to hear of hospital stay recently > I will let Dr Ellyn Hack be aware he is not in the office this week . I suggest you keep appointment for MARCH 9  with pharmacist. Dr Ellyn Hack does not have any available until middle of April 2020.  I will be glad to schedule you that time frame or with one of his assistants.  As I stated earlier, I will send message to Dr Ellyn Hack if he has any further instruction I will contact you.

## 2018-12-30 ENCOUNTER — Telehealth: Payer: Self-pay | Admitting: Internal Medicine

## 2018-12-30 NOTE — Telephone Encounter (Signed)
Fine but must keep apt tomorrow.

## 2018-12-30 NOTE — Telephone Encounter (Signed)
Copied from [place] [id number]. Topic: Quick Communication - Home Health Verbal Orders >> Dec 30, 2018 10:48 AM Berneta Levins wrote: Caller/Agency: Timmothy Sours with [facility] Number: [phone], OK to leave a message Requesting OT/PT/Skilled Nursing/Social Work: OT - ADL, transferes, IADL, exercise Frequency: projected frequency 2x a week for 1 week, 0x a week for 1 week, 1x a week for 1 week.

## 2018-12-31 ENCOUNTER — Ambulatory Visit (INDEPENDENT_AMBULATORY_CARE_PROVIDER_SITE_OTHER): Payer: Medicare Other | Admitting: Internal Medicine

## 2018-12-31 ENCOUNTER — Other Ambulatory Visit (INDEPENDENT_AMBULATORY_CARE_PROVIDER_SITE_OTHER): Payer: Medicare Other

## 2018-12-31 ENCOUNTER — Encounter: Payer: Self-pay | Admitting: Internal Medicine

## 2018-12-31 VITALS — BP 170/90 | HR 52 | Temp 97.3°F | Ht 65.0 in | Wt 139.0 lb

## 2018-12-31 DIAGNOSIS — E871 Hypo-osmolality and hyponatremia: Secondary | ICD-10-CM

## 2018-12-31 DIAGNOSIS — E039 Hypothyroidism, unspecified: Secondary | ICD-10-CM

## 2018-12-31 DIAGNOSIS — E876 Hypokalemia: Secondary | ICD-10-CM

## 2018-12-31 DIAGNOSIS — Z8669 Personal history of other diseases of the nervous system and sense organs: Secondary | ICD-10-CM | POA: Diagnosis not present

## 2018-12-31 DIAGNOSIS — G9341 Metabolic encephalopathy: Secondary | ICD-10-CM

## 2018-12-31 DIAGNOSIS — E46 Unspecified protein-calorie malnutrition: Secondary | ICD-10-CM

## 2018-12-31 DIAGNOSIS — E785 Hyperlipidemia, unspecified: Secondary | ICD-10-CM

## 2018-12-31 LAB — COMPREHENSIVE METABOLIC PANEL
ALT: 24 U/L (ref 0–35)
AST: 20 U/L (ref 0–37)
Albumin: 4.1 g/dL (ref 3.5–5.2)
Alkaline Phosphatase: 59 U/L (ref 39–117)
BUN: 12 mg/dL (ref 6–23)
CO2: 26 mEq/L (ref 19–32)
Calcium: 9.3 mg/dL (ref 8.4–10.5)
Chloride: 91 mEq/L — ABNORMAL LOW (ref 96–112)
Creatinine, Ser: 0.6 mg/dL (ref 0.40–1.20)
GFR: 97.71 mL/min (ref >60.00)
GLUCOSE: 87 mg/dL (ref 70–99)
Potassium: 4.1 mEq/L (ref 3.5–5.1)
Sodium: 124 mEq/L — ABNORMAL LOW (ref 135–145)
Total Bilirubin: 0.2 mg/dL (ref 0.2–1.2)
Total Protein: 6.7 g/dL (ref 6.0–8.3)

## 2018-12-31 LAB — CBC
HCT: 30 % — ABNORMAL LOW (ref 36.0–46.0)
HEMOGLOBIN: 10.7 g/dL — AB (ref 12.0–15.0)
MCHC: 35.6 g/dL (ref 30.0–36.0)
MCV: 96.4 fl (ref 78.0–100.0)
Platelets: 341 10*3/uL (ref 150.0–400.0)
RBC: 3.12 Mil/uL — ABNORMAL LOW (ref 3.87–5.11)
RDW: 13.3 % (ref 11.5–15.5)
WBC: 5 10*3/uL (ref 4.0–10.5)

## 2018-12-31 NOTE — Assessment & Plan Note (Signed)
Checking CMP and adjust as needed. We talked about how low solute diet can cause this as well as dehydration and likely both factors were to blame in her case.

## 2018-12-31 NOTE — Telephone Encounter (Signed)
Called don no answer LMOM w/MD response.[name] KitchenJohny [name]

## 2018-12-31 NOTE — Assessment & Plan Note (Signed)
Well controlled recently on synthroid 125 mcg daily.

## 2018-12-31 NOTE — Assessment & Plan Note (Signed)
Checking BMP and adjust as needed.

## 2018-12-31 NOTE — Patient Instructions (Addendum)
We are checking the labs today to see where the numbers are at.   Let us know how the blood pressure is doing as you are on lower doses of the blood pressure medicines.   Hyponatremia Hyponatremia is when the amount of salt (sodium) in your blood is too low. When sodium levels are low, your cells absorb extra water and they swell. The swelling happens throughout the body, but it mostly affects the brain. What are the causes? This condition may be caused by:  Heart, kidney, or liver problems.  Thyroid problems.  Adrenal gland problems.  Metabolic conditions, such as syndrome of inappropriate antidiuretic hormone (SIADH).  Severe vomiting and diarrhea.  Certain medicines or illegal drugs.  Dehydration.  Drinking too much water.  Eating a diet that is low in sodium.  Large burns on your body.  Sweating. What increases the risk? This condition is more likely to develop in people who:  Have long-term (chronic) kidney disease.  Have heart failure.  Have a medical condition that causes frequent or excessive diarrhea.  Have metabolic conditions, such as Addison disease or SIADH.  Take certain medicines that affect the sodium and fluid balance in the blood. Some of these medicine types include: ? Diuretics. ? NSAIDs. ? Some opioid pain medicines. ? Some antidepressants. ? Some seizure prevention medicines. What are the signs or symptoms? Symptoms of this condition include:  Nausea and vomiting.  Confusion.  Lethargy.  Agitation.  Headache.  Seizures.  Unconsciousness.  Appetite loss.  Muscle weakness and cramping.  Feeling weak or light-headed.  Having a rapid heart rate.  Fainting, in severe cases. How is this diagnosed? This condition is diagnosed with a medical history and physical exam. You will also have other tests, including:  Blood tests.  Urine tests. How is this treated? Treatment for this condition depends on the cause. Treatment may  include:  Fluids given through an IV tube that is inserted into one of your veins.  Medicines to correct the sodium imbalance. If medicines are causing the condition, the medicines will need to be adjusted.  Limiting water or fluid intake to get the correct sodium balance. Follow these instructions at home:  Take medicines only as directed by your health care provider. Many medicines can make this condition worse. Talk with your health care provider about any medicines that you are currently taking.  Carefully follow a recommended diet as directed by your health care provider.  Carefully follow instructions from your health care provider about fluid restrictions.  Keep all follow-up visits as directed by your health care provider. This is important.  Do not drink alcohol. Contact a health care provider if:  You develop worsening nausea, fatigue, headache, confusion, or weakness.  Your symptoms go away and then return.  You have problems following the recommended diet. Get help right away if:  You have a seizure.  You faint.  You have ongoing diarrhea or vomiting. This information is not intended to replace advice given to you by your health care provider. Make sure you discuss any questions you have with your health care provider. Document Released: 10/04/2002 Document Revised: 03/21/2016 Document Reviewed: 11/03/2014 Elsevier Interactive Patient Education  2019 [employer].

## 2018-12-31 NOTE — Assessment & Plan Note (Signed)
I have discussed that likely the statins did not cause hyponatremia but likely in her dehydrated state this was not a good time to try them and she did get severe muscle problems. Advised her to discuss with cardiology about this when she sees them next week.

## 2018-12-31 NOTE — Assessment & Plan Note (Signed)
At this time resolved. Likely due to several factors including most significantly hyponatremia.

## 2018-12-31 NOTE — Progress Notes (Signed)
   Subjective:   Patient ID: Marissa [name], female    DOB: Sep 18, 1946, 73 y.o.   MRN: [id number]  HPI The patient is a 73 YO female coming in for hospital follow up (in for low sodium and other abnormalities which caused confusion as well as some other problems, medications adjusted and fluids infused helped to raise sodium levels back to normal, some concerns about recent alcohol usage which she states stopped about 1 month ago and much prior to hospital stay). She was encouraged to go to rehab but did go home with PT/OT and nursing. She is doing this and feels things are doing well. She is drinking ensure daily to help with calorie intake and appetite is gradually improving. She denies chest pains or SOB. She denies stomach problems. She denies leg swelling. Husband is with her and denies confusion much since being home. She is taking medications but is not sure if she is taking her prior dosing or the new dosing of BP meds from the hospital (valsartan decreased from 320 mg daily to 80 mg daily and verapamil 240 mg daily to 180 mg daily). She will check at home and let us know.   PMH, [facility], social history reviewed and updated.    Review of Systems  Constitutional: Positive for activity change, appetite change and fatigue.  HENT: Negative.   Eyes: Negative.   Respiratory: Negative for cough, chest tightness and shortness of breath.   Cardiovascular: Negative for chest pain, palpitations and leg swelling.  Gastrointestinal: Negative for abdominal distention, abdominal pain, constipation, diarrhea, nausea and vomiting.  Musculoskeletal: Negative.   Skin: Negative.   Neurological: Positive for weakness. Negative for dizziness, light-headedness and numbness.  Psychiatric/Behavioral: Positive for dysphoric mood.    Objective:  Physical Exam Constitutional:      Appearance: She is well-developed.  HENT:     Head: Normocephalic and atraumatic.  Neck:     Musculoskeletal: Normal range of  motion.  Cardiovascular:     Rate and Rhythm: Normal rate and regular rhythm.  Pulmonary:     Effort: Pulmonary effort is normal. No respiratory distress.     Breath sounds: Normal breath sounds. No wheezing or rales.  Abdominal:     General: Bowel sounds are normal. There is no distension.     Palpations: Abdomen is soft.     Tenderness: There is no abdominal tenderness. There is no rebound.  Skin:    General: Skin is warm and dry.  Neurological:     Mental Status: She is alert and oriented to person, place, and time.     Coordination: Coordination normal.     Vitals:   12/31/18 1341 12/31/18 1425  BP: (!) 190/80 (!) 170/90  Pulse: (!) 52   Temp: (!) 97.3 F (36.3 C)   TempSrc: Oral   SpO2: 90%   Weight: 139 lb (63 kg)   Height: 5' 5"  (1.651 m)     Assessment & Plan:

## 2018-12-31 NOTE — Assessment & Plan Note (Signed)
She is doing ensure and eating better. She is encouraged to continue with good nutritional intake for her health.

## 2019-01-01 ENCOUNTER — Encounter: Payer: Self-pay | Admitting: Internal Medicine

## 2019-01-01 ENCOUNTER — Other Ambulatory Visit: Payer: Self-pay | Admitting: Internal Medicine

## 2019-01-01 DIAGNOSIS — E871 Hypo-osmolality and hyponatremia: Secondary | ICD-10-CM

## 2019-01-04 ENCOUNTER — Ambulatory Visit: Payer: Medicare Other | Admitting: Pharmacist Clinician (PhC)/ Clinical Pharmacy Specialist

## 2019-01-04 ENCOUNTER — Ambulatory Visit: Payer: Medicare Other | Admitting: Cardiology

## 2019-01-04 VITALS — BP 164/82 | HR 64

## 2019-01-04 DIAGNOSIS — E785 Hyperlipidemia, unspecified: Secondary | ICD-10-CM | POA: Diagnosis not present

## 2019-01-04 DIAGNOSIS — I10 Essential (primary) hypertension: Secondary | ICD-10-CM | POA: Diagnosis not present

## 2019-01-04 NOTE — Progress Notes (Signed)
01/04/2019 Marissa [name] [birth year] [id number]   HPI:  Marissa [name] is a 73 y.o. female patient of Dr Ellyn Hack, with a PMH below who presents today for hypertension and hyperlipidemia clinic follow up.  In addition to hypertension and hyperlipidemia, her medical history is significant for mitral valve prolapse, bicuspid aortic valve with stenosis, asthma, arthritis, ulcerative colitis, chronic pain and ascending aneurysm.   She was last seen in [facility] in May 2019.  In February of this year she was hospitalized for hyponatremia.  At the time of hospitalization she noted drinking large amounts of water, excessive thirst and an episode of N/V.   Her valsartan was decreased from 320 mg to 80 mg daily and the spironolactone was discontinued.   Currently she has home  health PT/OT with RN once weekly since her discharge.  She notes that she lost 10 pounds during and after hospitalization, and has managed to recently gain about 4-5 of those pounds back.   She is currently free from pain medications, after having spinal injections.  Patient still notes some dizziness with postural changes, as well as weakness and balance issues.  She is using a walker today and says she uses a cane in the house.    Blood Pressure Goal:  130/80  Current Medications:  Valsartan 80 mg, nebivolol 10 mg, verapamil 180 mg - all once daily in afternoon/evening  Family Hx:  No significant heart issues with parents, had 1 uncle with heart disease  Social Hx: no tobacco, no alcohol for past 6+ weeks, previously would drink 1-2 scotch/soda per day.    Diet:  Notes appetite finally starting to improve, states only eats occasional red meat  Exercise: currently doing PT/OT after hospitalization for weakness/hyponatremia  Home BP readings: did not bring any with her today  Intolerances:   diurtetics - hyponatremia, urinary incontinence  Labs:  12/24/18:  Na 132, K 4.4, Glu 103, BUN <5, SCr 0.72  12/30/18:   Na 124, K 4.1,  Glu 87, BUN 12, SCr 0.6  Wt Readings from Last 3 Encounters:  12/31/18 139 lb (63 kg)  12/24/18 136 lb 6.4 oz (61.9 kg)  12/07/18 137 lb (62.1 kg)   BP Readings from Last 3 Encounters:  01/04/19 (!) 164/82  12/31/18 (!) 170/90  12/24/18 (!) 143/61   Pulse Readings from Last 3 Encounters:  01/04/19 64  12/31/18 (!) 52  12/24/18 63    Current Outpatient Medications  Medication Sig Dispense Refill  . albuterol (PROAIR HFA) 108 (90 Base) MCG/ACT inhaler INHALE 2 PUFFS INTO THE LUNGS EVERY 6 (SIX) HOURS AS NEEDED. (Patient taking differently: Inhale 2 puffs into the lungs every 6 (six) hours as needed for shortness of breath. ) 8.5 each 2  . carisoprodol (SOMA) 350 MG tablet TAKE 1 TABLET(350 MG) BY MOUTH THREE TIMES DAILY AS NEEDED (Patient taking differently: Take 350 mg by mouth 3 (three) times daily as needed for muscle spasms. ) 60 tablet 2  . cloNIDine (CATAPRES) 0.1 MG tablet TAKE 1 TABLET BY MOUTH THREE TIMES DAILY AS NEEDED. TAKE ONLY AS NEEDED FOR SYSTOLIC BLOOD PRESSURE ABOVE 160 15 tablet 6  . cycloSPORINE (RESTASIS) 0.05 % ophthalmic emulsion Place 1 drop into both eyes 2 (two) times daily.    . Eszopiclone 3 MG TABS TAKE 1 TABLET BY MOUTH IMMEDIATELY BEFORE BEDTIME (Patient taking differently: Take 3 mg by mouth at bedtime. ) 30 tablet 3  . famotidine (PEPCID) 20 MG tablet Take 20  mg by mouth at bedtime.    [name] Kitchen FLUoxetine (PROZAC) 40 MG capsule TAKE 1 CAPSULE(40 MG) BY MOUTH TWICE DAILY (Patient taking differently: Take 40 mg by mouth 2 (two) times daily. ) 60 capsule 5  . levothyroxine (SYNTHROID, LEVOTHROID) 125 MCG tablet TAKE 1 TABLET(125 MCG) BY MOUTH DAILY (Patient taking differently: Take 125 mcg by mouth daily. ) 90 tablet 1  . montelukast (SINGULAIR) 10 MG tablet TAKE 1 TABLET(10 MG) BY MOUTH AT BEDTIME (Patient taking differently: Take 10 mg by mouth at bedtime. ) 90 tablet 1  . MYRBETRIQ 50 MG TB24 tablet TAKE 1 TABLET(50 MG) BY MOUTH DAILY (Patient taking differently:  Take 50 mg by mouth daily. ) 90 tablet 0  . nebivolol (BYSTOLIC) 10 MG tablet Take 1 tablet (10 mg total) by mouth daily. y 21 tablet 0  . omeprazole (PRILOSEC) 20 MG capsule Take 20 mg by mouth daily as needed.    . valsartan (DIOVAN) 80 MG tablet Take 1 tablet (80 mg total) by mouth daily. 30 tablet 0  . verapamil (CALAN-SR) 180 MG CR tablet Take 1 tablet (180 mg total) by mouth at bedtime. 30 tablet 2  . MINIVELLE 0.05 MG/24HR patch Place 1 patch onto the skin 2 (two) times a week.   3  . Multiple Vitamin (MULTIVITAMIN WITH MINERALS) TABS tablet Take 1 tablet by mouth daily. 30 tablet 0  . Respiratory Therapy Supplies (FLUTTER) DEVI Use as directed 1 each 0   No current facility-administered medications for this visit.     Allergies  Allergen Reactions  . Fentanyl Shortness Of Breath and Rash  . Amoxicillin Diarrhea    Severe diarrhea, rash to vaginal area with swelling   . Hctz [Hydrochlorothiazide] Other (See Comments)    HypoNatremia  . Hydralazine     Weak, sweats, red skin  . Codeine Other (See Comments)    makes her hyper  . Conjugated Estrogens Itching and Rash  . Erythromycin Rash    had a rash with emycin, has done ok with other meds in it's class  . Piroxicam Itching and Rash    Feldene    Past Medical History:  Diagnosis Date  . Arthritis   . Asthma   . Bicuspid aortic valve 05/2017   Likely functional bicuspid aortic valve with sclerosis and no stenosis.  . Bursitis   . Cataract    mild  . Dysthymia   . Hemorrhoids   . Hx of ulcerative colitis    per dr Arnoldo Morale as per pt.  . Hyperlipidemia   . Hypertension    managed - labile.  . Migraine   . Scoliosis   . 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 Spinal stenosis   . Thoracic aortic aneurysm (HCC)    Stable 4.2-4.3 cm (followed by Dr. Roxan Hockey)  . Thyroid disease     Blood  pressure (!) 164/82, pulse 64.  Essential hypertension Patient with essential hypertension in the setting of hyponatremia.  She will divide up her current BP medications, so that the valsartan is in the mornings, verapamil in the afternoons and nebivolol in the evenings.  I have asked that she keep a log of her twice daily BP readings and return in 2-3 weeks for follow up.  We will repeat BMET today as her last sodium level was low again.  Hyperlipidemia with target LDL less than 100 We did not discuss cholesterol in detail today.  Patient understands that her LDL is elevated, but would like to feel stronger before attempting another medication.  Advised that we will discuss this again at her next visit.  Can consider bempedoic acid if it is on the market then.     Tommy Medal PharmD CPP [facility] [address] [place], [place] [address] [phone]

## 2019-01-04 NOTE — Patient Instructions (Signed)
Return for a a follow up appointment in 2-3 weeks  Go to the lab today  Your blood pressure today is 164/82  Check your blood pressure at home daily (if able) and keep record of the readings.  Take your BP meds as follows:  Move valsartan to mornings  Use clonidine 0.1 mg up to twice daily for systolic pressure > 015.    Bring all of your meds, your BP cuff and your record of home blood pressures to your next appointment.  Exercise as you're able.  Watch canned and prepared boxed foods.  Eat more fresh fruits and vegetables and fewer canned items.  Avoid eating in fast food restaurants.    HOW TO TAKE YOUR BLOOD PRESSURE: . Rest 5 minutes before taking your blood pressure. .  Don't smoke or drink caffeinated beverages for at least 30 minutes before. . Take your blood pressure before (not after) you eat. . Sit comfortably with your back supported and both feet on the floor (don't cross your legs). . Elevate your arm to heart level on a table or a desk. . Use the proper sized cuff. It should fit smoothly and snugly around your bare upper arm. There should be enough room to slip a fingertip under the cuff. The bottom edge of the cuff should be 1 inch above the crease of the elbow. . Ideally, take 3 measurements at one sitting and record the average.

## 2019-01-04 NOTE — Assessment & Plan Note (Signed)
Patient with essential hypertension in the setting of hyponatremia.  She will divide up her current BP medications, so that the valsartan is in the mornings, verapamil in the afternoons and nebivolol in the evenings.  I have asked that she keep a log of her twice daily BP readings and return in 2-3 weeks for follow up.  We will repeat BMET today as her last sodium level was low again.

## 2019-01-04 NOTE — Assessment & Plan Note (Signed)
We did not discuss cholesterol in detail today.  Patient understands that her LDL is elevated, but would like to feel stronger before attempting another medication.  Advised that we will discuss this again at her next visit.  Can consider bempedoic acid if it is on the market then.

## 2019-01-05 LAB — BASIC METABOLIC PANEL
BUN / CREAT RATIO: 16 (ref 12–28)
BUN: 12 mg/dL (ref 8–27)
CO2: 21 mmol/L (ref 20–29)
Calcium: 9.2 mg/dL (ref 8.7–10.3)
Chloride: 96 mmol/L (ref 96–106)
Creatinine, Ser: 0.74 mg/dL (ref 0.57–1.00)
GFR calc Af Amer: 92 mL/min/{1.73_m2} (ref >59)
GFR calc non Af Amer: 80 mL/min/{1.73_m2} (ref >59)
Glucose: 91 mg/dL (ref 65–99)
Potassium: 4.9 mmol/L (ref 3.5–5.2)
Sodium: 133 mmol/L — ABNORMAL LOW (ref 134–144)

## 2019-01-06 ENCOUNTER — Other Ambulatory Visit (INDEPENDENT_AMBULATORY_CARE_PROVIDER_SITE_OTHER): Payer: Medicare Other

## 2019-01-06 DIAGNOSIS — E871 Hypo-osmolality and hyponatremia: Secondary | ICD-10-CM | POA: Diagnosis not present

## 2019-01-06 LAB — BASIC METABOLIC PANEL
BUN: 12 mg/dL (ref 6–23)
CO2: 25 mEq/L (ref 19–32)
Calcium: 9 mg/dL (ref 8.4–10.5)
Chloride: 91 mEq/L — ABNORMAL LOW (ref 96–112)
Creatinine, Ser: 0.72 mg/dL (ref 0.40–1.20)
GFR: 79.17 mL/min (ref >60.00)
GLUCOSE: 94 mg/dL (ref 70–99)
Potassium: 4.6 mEq/L (ref 3.5–5.1)
Sodium: 124 mEq/L — ABNORMAL LOW (ref 135–145)

## 2019-01-08 ENCOUNTER — Encounter: Payer: Self-pay | Admitting: Internal Medicine

## 2019-01-09 ENCOUNTER — Other Ambulatory Visit: Payer: Self-pay | Admitting: Cardiology

## 2019-01-12 ENCOUNTER — Other Ambulatory Visit: Payer: Self-pay | Admitting: Cardiology

## 2019-01-14 ENCOUNTER — Other Ambulatory Visit: Payer: Self-pay | Admitting: Internal Medicine

## 2019-01-15 ENCOUNTER — Other Ambulatory Visit: Payer: Self-pay | Admitting: Internal Medicine

## 2019-01-15 ENCOUNTER — Telehealth: Payer: Self-pay | Admitting: Internal Medicine

## 2019-01-15 NOTE — Telephone Encounter (Signed)
fyi

## 2019-01-15 NOTE — Telephone Encounter (Signed)
Copied from [place] [phone]. Topic: Quick Communication - See Telephone Encounter >> Jan 15, 2019  1:26 PM Burchel, Abbi R wrote: CRM for notification. See Telephone encounter for: 01/15/19.  Pt refused PT today bc she is not feeling well and has a cough.   Roselie Awkward [facility]) [phone]

## 2019-01-16 ENCOUNTER — Other Ambulatory Visit: Payer: Self-pay | Admitting: Internal Medicine

## 2019-01-21 ENCOUNTER — Ambulatory Visit: Payer: Medicare Other

## 2019-01-26 ENCOUNTER — Other Ambulatory Visit: Payer: Self-pay | Admitting: Internal Medicine

## 2019-01-26 NOTE — Telephone Encounter (Signed)
Control database checked last refill: 12/25/2018 LOV: 12/31/2018 NOV: 02/01/2019

## 2019-01-27 ENCOUNTER — Telehealth: Payer: Self-pay

## 2019-01-27 NOTE — Telephone Encounter (Signed)
Copied from [place] [id number]. Topic: Appointment Scheduling - Scheduling Inquiry for Clinic >> Jan 27, 2019 11:53 AM Ahmed Prima L wrote: Reason for CRM: patient would like to know does she still need to do her appt on 4/6. She said she is feeling fine. Please advise. >> Jan 27, 2019 12:08 PM Para Skeans A wrote: This is a 1 month FU. Make it a Virtual?  Routing to dr crawford, please advise, thanks

## 2019-01-28 NOTE — Telephone Encounter (Signed)
LVM to inform patient we could make this a virtual visit or we can canceled the appointment if she has no need for it.

## 2019-01-28 NOTE — Telephone Encounter (Signed)
Routing to brittany/sched., can you see if patient is good with what's needed to do virtual on 4/6?  Let me know if I need to do anything to help you, thanks

## 2019-01-28 NOTE — Telephone Encounter (Signed)
Patient cancelled for now. Will call back at a later time to r/s

## 2019-01-28 NOTE — Telephone Encounter (Signed)
I would recommend to do virtual if able to check in on status

## 2019-02-01 ENCOUNTER — Ambulatory Visit: Payer: Self-pay | Admitting: Internal Medicine

## 2019-02-01 ENCOUNTER — Other Ambulatory Visit: Payer: Self-pay | Admitting: Internal Medicine

## 2019-02-09 ENCOUNTER — Telehealth: Payer: Self-pay | Admitting: Cardiology

## 2019-02-09 NOTE — Telephone Encounter (Signed)
New Message   Patient would like to discuss appointment with you to see if she should make appointment or not.

## 2019-02-09 NOTE — Telephone Encounter (Signed)
Returned call.  Patient appointment was set for this Thursday with [facility].  Will have her send home BP readings to me thru MyChart and will have a telephone visit with her on Friday April 17 at 11am.  Patient voiced understanding

## 2019-02-11 ENCOUNTER — Ambulatory Visit: Payer: Medicare Other

## 2019-02-11 NOTE — Telephone Encounter (Signed)
The below message was sent in ERROR Thanks!

## 2019-02-11 NOTE — Telephone Encounter (Signed)
Marissa [name] - I'm not sure you can do anything, this was not a clinical denial.  It's not on their formulary so they don't really care why you think the patient needs it.  My only next thought would be to try Nexletol if he is willing.  I can email you a patient info sheet about it.  New drug, just hit the market about 2 weeks ago.  Does not work like a statin or Repatha, so hopefully better tolerated.  No 30 day [name] trial cards, but the patient can go online and request at [place] card.  According to the rep, he will get the first 30 days for $10 with that card then the prior auth for that would need to be done.  Because it's a new drug in its own class, we may have better luck with the insurer.  I'll email you the info sheet if you want to share it with him

## 2019-02-12 ENCOUNTER — Telehealth: Payer: Self-pay | Admitting: Pharmacist Clinician (PhC)/ Clinical Pharmacy Specialist

## 2019-02-12 NOTE — Telephone Encounter (Signed)
Patient was scheduled for April 16 CVRR OV to review BP and cholesterol issues.  Appointment cancelled due to [place] outbreak and conducted via telephone today.  Total time spent on phone with patient: 25 minutes.  At her last visit patient was taking valsartan 80 mg, bystolic 10 mg and verapamil 180 mg, all at the same time of day.  We asked that she divide them up and continue monitoring home blood pressures.  Her valsartan dose was previously 320 mg, and she also had spironolactone, however she was hospitalized back in February for hyponatremia.  The valsartan was decreased and the spironolactone discontinued. Dr. Ellyn Hack had also discussed getting patient on to cholesterol lowering therapy as her LDL in February was 183.  Patient had declined treatment at that time, stating she wanted to get back to feeling more normal before re-starting any cholesterol medications.    Today patient reports that she is feeling well overall, but admits to a bit of laziness in this time of stay at home mandates.  Her weight is stable at 133 pounds and she is eating well.  States that they will get take out food once or twice per week, but for the most part are eating home cooked, low sodium foods.  She continues to drink some Ensure/Glucerna.  For exercise, she is done with PT, but continues to do the stretching/strengthening exercises that she was given.  She is also trying to get outside and walk daily.    Her first 5 blood pressure readings from this month (sent in via MyChart) showed an average BP of 132/74, however the readings she reported on the phone this morning were higher, with systolic readings all in the 140's.  She reports no readings at home > 160 in the last few weeks, and believes it has been at lease 3-4 weeks since she took a dose of clonidine.    I brought up the subject of cholesterol again, but patient is still leery of starting medication.  She understands the dangers of having an LDL at 183, but  would like to wait another 3 months before starting medication.  At that time we might have more information on patients using Nexletol, which might be a better option for her.    Today I am not going to make any changes to her medications.  I would ideally like to increase the valsartan to 160 mg, however we would need to get some repeat electrolyte labs, and I don't feel comfortable sending her to any labs at the current time.   Her heart rates are mostly in the 50's and low 60's so increasing the Bystolic or verapamil are not a good idea either.  Patient is going to check her heart rate averages from her Apple Watch and let us know if it is noting any readings < 50 bpm.  Patient understands and agrees with the current plan.  She is due to "see" Dr. Ellyn Hack in May and we will follow up with her in 2-3 months.  At that time if her pressure is still in the 140's, we can hopefully get her to the lab and potentially increase the valsartan.

## 2019-02-13 ENCOUNTER — Other Ambulatory Visit: Payer: Self-pay | Admitting: Cardiology

## 2019-02-17 ENCOUNTER — Other Ambulatory Visit: Payer: Self-pay

## 2019-02-17 NOTE — Telephone Encounter (Signed)
While I do not think that Spironolactone is a Sodium wasting medication, it was stopped for HypoNatremia & Erasmo Downer did not restart in CVRR.  So, lets hold off on refilling.  [name] c

## 2019-02-19 ENCOUNTER — Telehealth: Payer: Self-pay

## 2019-02-19 NOTE — Telephone Encounter (Signed)
Left message on patients voicemail to call office back to change office visit to a virtual or telephone visit on May 5 at 1:40pm.

## 2019-02-25 ENCOUNTER — Other Ambulatory Visit: Payer: Self-pay | Admitting: Pharmacist Clinician (PhC)/ Clinical Pharmacy Specialist

## 2019-02-25 DIAGNOSIS — I10 Essential (primary) hypertension: Secondary | ICD-10-CM

## 2019-02-25 MED ORDER — VALSARTAN 80 MG PO TABS: 80.0000 mg | ORAL_TABLET | Freq: Every day | ORAL | 3 refills | Status: DC

## 2019-02-25 NOTE — Telephone Encounter (Signed)
Spoke with patient.  Apparently after hospitalization when dose of valsartan was decreased to 80 mg, prescription never made it to her pharmacy.  About 2 weeks ago, husband went to get refill and was given 320 mg tablets again.  Patient did not realize until the past few days that this was in error.  She developed diarrhea again and feeling weak.  Will send rx to pharmacy for 80 mg dose and patient will go to [facility] office tomorrow for BMET to be sure electrolytes are stable.    Patient voiced understandin/

## 2019-02-26 DIAGNOSIS — I10 Essential (primary) hypertension: Secondary | ICD-10-CM | POA: Diagnosis not present

## 2019-02-27 LAB — BASIC METABOLIC PANEL
BUN/Creatinine Ratio: 12 (ref 12–28)
BUN: 8 mg/dL (ref 8–27)
CO2: 22 mmol/L (ref 20–29)
Calcium: 9.4 mg/dL (ref 8.7–10.3)
Chloride: 95 mmol/L — ABNORMAL LOW (ref 96–106)
Creatinine, Ser: 0.68 mg/dL (ref 0.57–1.00)
GFR calc Af Amer: 100 mL/min/{1.73_m2} (ref >59)
GFR calc non Af Amer: 86 mL/min/{1.73_m2} (ref >59)
Glucose: 92 mg/dL (ref 65–99)
Potassium: 4.4 mmol/L (ref 3.5–5.2)
Sodium: 134 mmol/L (ref 134–144)

## 2019-03-01 ENCOUNTER — Telehealth: Payer: Self-pay | Admitting: Cardiology

## 2019-03-02 ENCOUNTER — Telehealth (INDEPENDENT_AMBULATORY_CARE_PROVIDER_SITE_OTHER): Payer: Medicare Other | Admitting: Cardiology

## 2019-03-02 ENCOUNTER — Encounter: Payer: Self-pay | Admitting: Cardiology

## 2019-03-02 ENCOUNTER — Telehealth: Payer: Self-pay | Admitting: *Deleted

## 2019-03-02 VITALS — BP 178/92 | HR 54

## 2019-03-02 DIAGNOSIS — R0989 Other specified symptoms and signs involving the circulatory and respiratory systems: Secondary | ICD-10-CM

## 2019-03-02 DIAGNOSIS — I350 Nonrheumatic aortic (valve) stenosis: Secondary | ICD-10-CM

## 2019-03-02 DIAGNOSIS — E785 Hyperlipidemia, unspecified: Secondary | ICD-10-CM

## 2019-03-02 DIAGNOSIS — E871 Hypo-osmolality and hyponatremia: Secondary | ICD-10-CM

## 2019-03-02 DIAGNOSIS — I77810 Thoracic aortic ectasia: Secondary | ICD-10-CM

## 2019-03-02 MED ORDER — CLONIDINE HCL 0.1 MG PO TABS: 0.1000 mg | ORAL_TABLET | Freq: Two times a day (BID) | ORAL | 6 refills | Status: DC | PRN

## 2019-03-02 MED ORDER — AMLODIPINE BESYLATE 10 MG PO TABS: 10.0000 mg | ORAL_TABLET | Freq: Every day | ORAL | 4 refills | Status: DC

## 2019-03-02 NOTE — Assessment & Plan Note (Signed)
Most recent chemistry panel showed sodium 134 with stable potassium of 4.4 as well.  At this point I think we will simply need to avoid any type of diuretic.  Seems relatively that spironolactone could be responsible for hyponatremia when she has been on it for many months leading up to the hospitalization.  More likely the hyponatremia was related to increased fluid intake with decreased p.o. intake leading to dilutional hyponatremia.

## 2019-03-02 NOTE — Assessment & Plan Note (Signed)
Still has mild stenosis by recent echo.  Was not felt to be bicuspid, and on my review, there does appear to be 3 cusps, but with calcification leading to potential fusion.  We will reassess echo in 1 to 2 years.

## 2019-03-02 NOTE — Progress Notes (Signed)
Virtual Visit via Telephone Note   This visit type was conducted due to national recommendations for restrictions regarding the COVID-19 Pandemic (e.g. social distancing) in an effort to limit this patient's exposure and mitigate transmission in our community.  Due to her co-morbid illnesses, this patient is at least at moderate risk for complications without adequate follow up.  This format is felt to be most appropriate for this patient at this time.  The patient did not have access to video technology/had technical difficulties with video requiring transitioning to audio format only (telephone).  All issues noted in this document were discussed and addressed.  No physical exam could be performed with this format.  Please refer to the patient's chart for her  consent to telehealth for [facility].   Patient has given verbal permission to conduct this visit via virtual appointment and to bill insurance 02/19/2019 8:28 AM     Evaluation Performed:  Follow-up visit  Date:  03/02/2019   ID:  Marissa [name], DOB 1946-04-06, MRN [id number]  Patient Location: Home Provider Location: Home  PCP:  [name], MD  Cardiologist:  [name], MD ;  Also followed by Tommy Medal, RPH- CCP & Rodriguez-Guzman, Raquel, RP in [facility] ([facility]) Electrophysiologist:  None   Chief Complaint: 13-monthfollow-up.  Note  History of Present Illness:    Marissa Toppingis a 73y.o. female with PMH notable for chronic pain syndrome, hypertension, hyperlipidemia and palpitations as well as mild ascending aortic dilation with mild aortic stenosis who presents via audio/video conferencing for a telehealth visit today. --** She does not have mitral prolapse nor does she have bicuspid aortic valve as noted in her H&P from February.  Also, the Ascending Aorta Is Not Aneurysmal -> She also has a history of hyponatremia and urinary  incontinence with HCTZ and chlorthalidone in the past.  Also had issues with hydralazine.  Marissa [name] was last seen by me on October 02, 2018.  She was actually feeling fairly well.  Blood pressures actually been a little bit low and she was feeling a little bit lethargic.  With better blood pressure control, she noted more fatigue and exercise intolerance. -- > I reduce the verapamil dose to 120 mg daily.  Told her to take verapamil in the afternoon and Bystolic at night.  --> Valsartan has been increased from 120 mg to 180 mg in February.;  Was also started on 10 mg rosuvastatin every Monday and Friday with plans to increase to every Monday Wednesday Friday. -- >  She ended up stopping the simvastatin because of intolerance.  Preparations for Repatha have been delayed because of blood pressure issues.  Interval History:  She was just seen via [place]health [place] April 17. She was noted to be taking valsartan 80 mg daily, Bystolic 10 mg and verapamil 180 mg at all at the same time.  She was asked to divide them up and taken to the times a day.. --> Her valsartan dose has been reduced from 320 mg down to 80 mg and spironolactone discontinued when hospitalized for hyponatremia in February. (At the time of hospitalization she had been drinking large amounts of water because of excessive thirst as well as nausea and vomiting) --> She was doing stretching and strengthening exercises and try to get outside to walk daily having completed physical therapy. Average blood pressures were roughly  132/74, but were higher the morning of the visit.  No pressures greater than 160 mmHg.  Had not had to use as needed clonidine for greater than 4 weeks. --> No medication changes were made at that time.  Concern about increasing Bystolic or verapamil because of potential bradycardia, and concern for increasing ARB because of need for lab follow-up. --> Chose not  to discuss with the management at that time.  --> Unfortunately, when her medications were refilled, she was given 320 mg tablets of valsartan erroneously.  Started to feel weeks and developed diarrhea.  The correct prescription bradycardia milligrams was sent, and a BMP was checked.  Lab Results  Component Value Date   CREATININE 0.68 02/26/2019   BUN 8 02/26/2019   NA 134 02/26/2019   K 4.4 02/26/2019   CL 95 (L) 02/26/2019   CO2 22 02/26/2019    Today Marissa [name] is being seen for a telehealth for routine follow-up.  She has opted to Telephone Only.  Has had to take Clonidine 3 x in last 3 days -- BP was in 190s.  Does have HA & some L sided CP with high BP - Aching.  Doesn't note Sx with normal BP. Also having fluttering symptoms.   Things started with her having a fall back in January.  After that, she started having multiple GI issues.  She continued to aggressively hydrate, but was not doing well with actually eating.  This led to her developing hyponatremia and being hospitalized.  Really has not been feeling well since that spell.  Still with intermittent loose stools. Eating & drinking better.  Drinking Gatorade & Ensure - low sugar.    Feels tired, but better with changing Bystolic to PM.  Lots of GI upset.  Hoping to get out & do more, but with diarrhea - has not felt like exercising.   Cardiovascular ROS: positive for - chest pain and Fatigue, dizziness and palpitations negative for - dyspnea on exertion, loss of consciousness, orthopnea, paroxysmal nocturnal dyspnea, rapid heart rate, shortness of breath or Near syncope or syncope.  TIA/amaurosis fugax.  The patient does not have symptoms concerning for COVID-19 infection (fever, chills, cough, or new shortness of breath).  The patient is practicing social distancing.  ROS:  Please see the history of present illness.    Review of Systems  Constitutional: Positive for chills and malaise/fatigue (Much better though as she is  starting to eat and drink more.). Negative for fever.  HENT: Negative for congestion and nosebleeds.   Respiratory: Negative for cough and shortness of breath.   Gastrointestinal: Positive for abdominal pain, diarrhea and nausea.  Musculoskeletal: Negative for falls and joint pain.  Neurological: Positive for dizziness, weakness (Global) and headaches (With high blood pressure.).  Psychiatric/Behavioral: Negative for memory loss. The patient is nervous/anxious. The patient does not have insomnia.        Maintaining healthy stable mood.  All other systems reviewed and are negative.   Past Medical History:  Diagnosis Date   Arthritis    Asthma    Bicuspid aortic valve 05/2017   Likely functional bicuspid aortic valve with sclerosis and no stenosis.   Bursitis    Cataract    mild   Dysthymia    Hemorrhoids    Hx of ulcerative colitis    per dr Arnoldo Morale as per pt.   Hyperlipidemia    Hypertension    managed - labile.   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 Roxan Hockey)   Thyroid disease    Past Surgical History:  Procedure Laterality Date   ABDOMINAL HYSTERECTOMY     BUNIONECTOMY     Cardiac Event Monitor  07/2017   Overall relatively normal.  Normal sinus rhythm with rare bradycardia and tachycardia.  Heart rate ranged from 55-110 bpm.  Occasional PACs and PVCs, every single 1 was felt.  No arrhythmias other than one short 4 beat run of PACs.   COLONOSCOPY  02-04-2005   all normal    FOOT SURGERY     3 pins in toes    HAND SURGERY     left thumb joint resection   nasal revision     NM MYOVIEW LTD  07/2017   LOW RISK study.  No ischemia or infarction.  EF greater than 65%.    SINUS IRRIGATION     TONSILLECTOMY AND  ADENOIDECTOMY     TRANSTHORACIC ECHOCARDIOGRAM  06/'1, 8/'19   a) Moderate LVH. Normal EF 60-65%. Normal diastolic parameters. --> Difficult to fully visualize the aortic valve. Cannot exclude bicuspid valve. Mild aortic stenosis noted. No PFO. Mildly dilated left atrium. Trivial MR. No comment on mitral valve prolapse. Moderately dilated ascending aorta.;; b) F/u Echo To evaluate the aortic valve. -- Bicuspid AoV - mildly thickened / calcified. - No stenosis   TRANSTHORACIC ECHOCARDIOGRAM  11/2018   Normal LV size and function.  EF 60-65%.  GR 1 DD.  Normal RV size and function.  Moderate aortic valve calcification with mild Aortic Stenosis.  Normal LV size and function.  EF 60-65%.  GR 1 DD.  Normal RV size and function.  Moderate aortic calcification with mild stenosis.  Mild dilation of ascending aorta ~4.2 mm     Current Meds  Medication Sig   albuterol (PROAIR HFA) 108 (90 Base) MCG/ACT inhaler INHALE 2 PUFFS INTO THE LUNGS EVERY 6 (SIX) HOURS AS NEEDED. (Patient taking differently: Inhale 2 puffs into the lungs every 6 (six) hours as needed for shortness of breath. )   carisoprodol (SOMA) 350 MG tablet TAKE 1 TABLET(350 MG) BY MOUTH THREE TIMES DAILY AS NEEDED   cloNIDine (CATAPRES) 0.1 MG tablet Take 1 tablet (0.1 mg total) by mouth 2 (two) times daily as needed.   cycloSPORINE (RESTASIS) 0.05 % ophthalmic emulsion Place 1 drop into both eyes 2 (two) times daily.   Eszopiclone 3 MG TABS TAKE 1 TABLET BY MOUTH IMMEDIATELY BEFORE BEDTIME   famotidine (PEPCID) 20 MG tablet Take 20 mg by mouth as needed.    FLUoxetine (PROZAC) 40 MG capsule TAKE 1 CAPSULE(40 MG) BY MOUTH TWICE DAILY (Patient taking differently: Take 40 mg by mouth 2 (two) times daily. )   levothyroxine (SYNTHROID, LEVOTHROID) 125 MCG tablet TAKE 1 TABLET(125 MCG) BY MOUTH DAILY   MINIVELLE 0.05 MG/24HR patch Place 1 patch onto the skin 2 (two) times a week.    montelukast (SINGULAIR) 10 MG tablet TAKE 1 TABLET(10  MG) BY MOUTH AT BEDTIME (Patient taking differently: Take 10 mg by mouth at bedtime. )   Multiple Vitamin (MULTIVITAMIN WITH MINERALS) TABS tablet Take 1 tablet by mouth daily.   MYRBETRIQ 50 MG TB24 tablet TAKE 1 TABLET(50 MG) BY MOUTH DAILY  nebivolol (BYSTOLIC) 10 MG tablet Take 1 tablet (10 mg total) by mouth daily. TAKE 1 TABLET AT 8PM DAILY.   omeprazole (PRILOSEC) 20 MG capsule Take 20 mg by mouth daily as needed.   [DISCONTINUED] cloNIDine (CATAPRES) 0.1 MG tablet TAKE 1 TABLET BY MOUTH THREE TIMES DAILY AS NEEDED. TAKE ONLY AS NEEDED FOR SYSTOLIC BLOOD PRESSURE ABOVE 160   [DISCONTINUED] valsartan (DIOVAN) 80 MG tablet Take 1 tablet (80 mg total) by mouth daily.   [DISCONTINUED] verapamil (CALAN-SR) 180 MG CR tablet Take 1 tablet (180 mg total) by mouth at bedtime.     Allergies:   Fentanyl; Rosuvastatin; Amoxicillin; Hctz [hydrochlorothiazide]; Hydralazine; Codeine; Conjugated estrogens; Erythromycin; and Piroxicam   Social History   Tobacco Use   Smoking status: Never Smoker   Smokeless tobacco: Never Used  Substance Use Topics   Alcohol use: Yes    Alcohol/week: 0.0 standard drinks    Comment: occ    Drug use: No     Family Hx: The patient's family history includes Arthritis in her mother; Breast cancer in her maternal grandmother, paternal aunt, and paternal grandmother; Diabetes in her paternal aunt; Heart disease in her maternal grandfather and maternal uncle; Leukemia in her father. There is no history of Colon cancer.   Prior CV studies:   The following studies were reviewed today:  Echo 12/22/2018: Normal LV size and function.  EF 60-65%.  GR 1 DD.  Normal RV size and function.  Moderate aortic valve calcification with mild Aortic Stenosis.  Mild dilation of ascending aorta 4.2 mm.  Labs/Other Tests and Data Reviewed:    EKG:  No ECG reviewed.  Recent Labs: 12/21/2018: B Natriuretic Peptide 114.2 12/22/2018: TSH 0.813 12/23/2018: Magnesium  1.8 12/31/2018: ALT 24; Hemoglobin 10.7; Platelets 341.0 02/26/2019: BUN 8; Creatinine, Ser 0.68; Potassium 4.4; Sodium 134   Recent Lipid Panel Lab Results  Component Value Date/Time   CHOL 297 (H) 12/01/2018 10:41 AM   TRIG 73 12/01/2018 10:41 AM   HDL 99 12/01/2018 10:41 AM   CHOLHDL 3.0 12/01/2018 10:41 AM   CHOLHDL 3 09/01/2013 12:01 PM   LDLCALC 183 (H) 12/01/2018 10:41 AM   LDLDIRECT 145.7 09/01/2013 12:01 PM    Wt Readings from Last 3 Encounters:  12/31/18 139 lb (63 kg)  12/24/18 136 lb 6.4 oz (61.9 kg)  12/07/18 137 lb (62.1 kg)     Objective:    Vital Signs:  BP (!) 178/92    Pulse (!) 54   VITAL SIGNS:  reviewed GEN:  no acute distress RESPIRATORY:  non-labored NEURO:  A&O x 3 PSYCH:  normal affect   ASSESSMENT & PLAN:    Problem List Items Addressed This Visit    Hyperlipidemia with target LDL less than 100 (Chronic)    With all of her somatic symptoms ongoing at this point, I really am reluctant to attempt any other medications until we get her blood pressure stabilized.  Once her GI symptoms have blood pressures stabilized, we can then consider further treatment.  Certainly would not want to retry rosuvastatin.  She may be best served with [place] or [place].  We will reevaluate timing of this in upcoming follow-up visits.      Relevant Medications   amLODipine (NORVASC) 10 MG tablet   cloNIDine (CATAPRES) 0.1 MG tablet   Hyponatremia    Most recent chemistry panel showed sodium 134 with stable potassium of 4.4 as well.  At this point I think we will simply need to avoid any type  of diuretic.  Seems relatively that spironolactone could be responsible for hyponatremia when she has been on it for many months leading up to the hospitalization.  More likely the hyponatremia was related to increased fluid intake with decreased p.o. intake leading to dilutional hyponatremia.      Labile hypertension (Chronic)    I have discussed her blood pressure management via  texting with Tommy Medal, RPH- CCP, fearful that she may be having her GI symptoms related to ARB, especially at higher dose.  Despite this, however she has been on the lower dose of losartan for 3 days now and is still having loose stools.  At this point my plan is to try to detoxify her from ARB's (in the past did not do well on ACE inhibitor as was converted to ARB).  I would also like to convert from losartan to amlodipine for better blood pressure control and less bradycardia.  Plan: For now we will stop valsartan, with the understanding that she may very well need to take daily clonidine. 3 days after stopping valsartan, will stop verapamil and start amlodipine 5 mg daily for 3 days then increase to 10 mg daily. She will follow-up with CVR our pharmacist in roughly 3 weeks to reassess blood pressure and her GI symptoms.  If she is still having GI symptoms, then I think we can restart the ARB as it is not likely the root cause. By stopping the verapamil, we could potentially future increase Bystolic back to 20 mg.      Relevant Medications   amLODipine (NORVASC) 10 MG tablet   cloNIDine (CATAPRES) 0.1 MG tablet   Mild aortic stenosis - Primary (Chronic)    Still has mild stenosis by recent echo.  Was not felt to be bicuspid, and on my review, there does appear to be 3 cusps, but with calcification leading to potential fusion.  We will reassess echo in 1 to 2 years.      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 Roxan Hockey for progression of disease with annual imaging.  Other than mild aortic stenosis the main risk factor is hypertension diabetes continue to struggle to treat. The other risk factor is her lipids which we will address once her blood pressure and GI symptoms are stabilized.      Relevant Medications   amLODipine (NORVASC) 10 MG  tablet   cloNIDine (CATAPRES) 0.1 MG tablet      COVID-19 Education: The signs and symptoms of COVID-19 were discussed with the patient and how to seek care for testing (follow up with PCP or arrange E-visit).   The importance of social distancing was discussed today.  Time:   Today, I have spent 25 minutes with the patient with telehealth technology discussing the above problems.     Medication Adjustments/Labs and Tests Ordered: Current medicines are reviewed at length with the patient today.  Concerns regarding medicines are outlined above.  Medication Instructions:   Continue to drink Gatorade as you are doing, but make sure that you are also trying to increase solid foods as well.  For now - Stop Valsartan (we may go back to it, but are trying to detox from it). -- I suspect that you will need to take Clonidine daily (for elevated BP as directed).  After 3 days -- we will have you Stop  Verapamil & start 1/2 tab of Amlodipine 10 mg --> after 3 days, increase to full 10 mg Amlodipine.  Tests Ordered: No orders of the defined types were placed in this encounter.   Medication Changes: Meds ordered this encounter  Medications   amLODipine (NORVASC) 10 MG tablet    Sig: Take 1 tablet (10 mg total) by mouth daily.    Dispense:  30 tablet    Refill:  4   cloNIDine (CATAPRES) 0.1 MG tablet    Sig: Take 1 tablet (0.1 mg total) by mouth 2 (two) times daily as needed.    Dispense:  60 tablet    Refill:  6   Refill Clonidine to allow for at least 1-2 per day PRN. D/c Verapamil & Valsartan (as directed -- do not throw away bottles). New RX: Amlodipine 10 mg PO daily (as directed above). -  Do 30 d with 3 refills to make sure that she tolerates.   Disposition:  Follow up -- CVRR - [place] in ~3 weeks & Dr. Ellyn Hack in ~8 weeks.     Signed, [name], MD  03/02/2019 4:56 PM    [place]

## 2019-03-02 NOTE — Assessment & Plan Note (Addendum)
With all of her somatic symptoms ongoing at this point, I really am reluctant to attempt any other medications until we get her blood pressure stabilized.  Once her GI symptoms have blood pressures stabilized, we can then consider further treatment.  Certainly would not want to retry rosuvastatin.  She may be best served with [place] or [place].  We will reevaluate timing of this in upcoming follow-up visits.

## 2019-03-02 NOTE — Assessment & Plan Note (Addendum)
This is being followed by Dr. Roxan Hockey for progression of disease with annual imaging.  Other than mild aortic stenosis the main risk factor is hypertension diabetes continue to struggle to treat. The other risk factor is her lipids which we will address once her blood pressure and GI symptoms are stabilized.

## 2019-03-02 NOTE — Patient Instructions (Addendum)
Medication Instructions:   Continue to drink Gatorade as you are doing, but make sure that you are also trying to increase solid foods as well.  For now - Stop Valsartan (we may go back to it, but are trying to detox from it). -- I suspect that you will need to take Clonidine daily (for elevated BP as directed).  After 3 days -- we will have you Stop Verapamil & start 1/2 tab of Amlodipine 10 mg --> after 3 days, increase to full 10 mg Amlodipine.  Refill Clonidine to allow for at least 1-2 per day PRN. stop Verapamil & Valsartan (as directed -- do not throw away bottles). New RX: Amlodipine 10 mg PO daily (as directed above). -  Do 30 d with 3 refills to make sure that she tolerates.  If you need a refill on your cardiac medications before your next appointment, please call your pharmacy.   Lab work: n/a  If you have labs (blood work) drawn today and your tests are completely normal, you will receive your results only by: [name] Kitchen MyChart Message (if you have MyChart) OR . A paper copy in the mail If you have any lab test that is abnormal or we need to change your treatment, we will call you to review the results.  Testing/Procedures: n/a   Follow-Up: At [facility], you and your health needs are our priority.  As part of our continuing mission to provide you with exceptional heart care, we have created designated Provider Care Teams.  These Care Teams include your primary Cardiologist (physician) and Advanced Practice Providers (APPs -  Physician Assistants and Nurse Practitioners) who all work together to provide you with the care you need, when you need it. You will need a follow up appointment in 3 weeks with Tommy Medal- [facility] & in  2-3   months with Dr. Ellyn Hack.  Please call our office 2 months in advance to schedule this appointment.  You may see [name], MD  or one of the following Advanced Practice Providers on your designated Care Team:   Rosaria Ferries, PA-C . Jory Sims, DNP, ANP  Any Other Special Instructions Will Be Listed Below (If Applicable).

## 2019-03-02 NOTE — Assessment & Plan Note (Signed)
I have discussed her blood pressure management via texting with Tommy Medal, RPH- CCP, fearful that she may be having her GI symptoms related to ARB, especially at higher dose.  Despite this, however she has been on the lower dose of losartan for 3 days now and is still having loose stools.  At this point my plan is to try to detoxify her from ARB's (in the past did not do well on ACE inhibitor as was converted to ARB).  I would also like to convert from losartan to amlodipine for better blood pressure control and less bradycardia.  Plan: For now we will stop valsartan, with the understanding that she may very well need to take daily clonidine. 3 days after stopping valsartan, will stop verapamil and start amlodipine 5 mg daily for 3 days then increase to 10 mg daily. She will follow-up with [facility] our pharmacist in roughly 3 weeks to reassess blood pressure and her GI symptoms.  If she is still having GI symptoms, then I think we can restart the ARB as it is not likely the root cause. By stopping the verapamil, we could potentially future increase Bystolic back to 20 mg.

## 2019-03-02 NOTE — Telephone Encounter (Signed)
Spoke to patient  Instruction reviewed fronm  televisit  03/02/19  avs summary will be sent via mychart patient verbalized understanding

## 2019-03-03 NOTE — Telephone Encounter (Signed)
After summary visit mailed to patient as requested.

## 2019-03-12 ENCOUNTER — Other Ambulatory Visit: Payer: Self-pay | Admitting: Cardiology

## 2019-03-24 ENCOUNTER — Telehealth: Payer: Self-pay | Admitting: Pharmacist Clinician (PhC)/ Clinical Pharmacy Specialist

## 2019-03-24 NOTE — Telephone Encounter (Signed)
LMOM for patient to call back with a good time/number to review her BP since med changes made by Dr. Ellyn Hack several weeks ago.

## 2019-03-25 NOTE — Telephone Encounter (Signed)
Returned call to patient.  Review for blood pressure after changes made by Dr. Ellyn Hack on 5/5 Telemedicine visit.  Due to COVID-19, this is a telephone encounter.  Time spent with direct patient interaction was 27 minutes.    At May 5 telehealth visit with Dr. Ellyn Hack her BP meds were changed as follows:        D/C valsartan and verapamil      Start amlodipine 5 mg qd x 3-4 days then increase to 10 mg daily.  Patient notes that she still has problems with diarrhea on an almost daily basis.  She isn't sure if tied to the medication changes or not.  However, she has noted diarrhea more frequently since her hospital discharge in Feb, so at this point I doubt is related to any medication sensitivities.  She is still using Lomotil, almost daily.  She continues to drink Glucerna and Gatorade, both at least 1 container daily, to help with nutrition and keeping her electrolytes up (because of the diarrhea).  Glucerna contains a thickening agent called carrageenan, which for some people is a GI irritant and can cause diarrhea.  She does note that she drinks the Glucerna every morning, and usually mid-morning is when she has the problems with diarrhea.    Her home BP readings have stabilized after her medication changes.  Has only had to use the clonidine once in the past 2 weeks, with most BP readings in the 009-233 range systolic.  This morning she was at 127/78 with a HR in the 60's.    For now I have asked her to stop drinking the Glucerna and eat a bland diet for a few days to let her GI tract heal.  She was using the Glucerna for the caloric intake, so she will instead eat breakfast in the mornings.  Should the diarrhea continue, she should contact her PCP early next week for follow up.  I will reach out to her in 2-3 weeks to see if she has noted improvement, and make any further BP medication adjustments at that time.  I think if she is without diarrhea at that time we could safely re-start her on  valsartan, in addition to the amlodipine and bystolic.

## 2019-03-28 ENCOUNTER — Other Ambulatory Visit: Payer: Self-pay | Admitting: Cardiology

## 2019-04-06 ENCOUNTER — Telehealth: Payer: Self-pay

## 2019-04-06 NOTE — Telephone Encounter (Signed)
Control database checked last refill: [id number]

## 2019-04-06 NOTE — Telephone Encounter (Signed)
Rhea calling from [employer] states that medication has been approved. Please advise   [phone]

## 2019-04-08 NOTE — Telephone Encounter (Signed)
Good through 04/06/2019 through 04/05/2020

## 2019-04-12 ENCOUNTER — Telehealth: Payer: Self-pay | Admitting: *Deleted

## 2019-04-12 DIAGNOSIS — R0989 Other specified symptoms and signs involving the circulatory and respiratory systems: Secondary | ICD-10-CM

## 2019-04-12 DIAGNOSIS — E785 Hyperlipidemia, unspecified: Secondary | ICD-10-CM

## 2019-04-12 NOTE — Telephone Encounter (Signed)
-----   Message from Raiford Simmonds, RN sent at 12/11/2018 12:14 PM EST -----  Write letter   Patient reminder  Message Contents  Raiford Simmonds, RN  Raiford Simmonds, RN    Labs due 04/10/19 lipid, cmp   Will mail @ 03/10/19 letter and lab slip

## 2019-04-12 NOTE — Telephone Encounter (Signed)
MAILED LABSLIP- LIPD CMP AND LETTER  DUE BEFORE July 15,2020

## 2019-04-19 ENCOUNTER — Other Ambulatory Visit: Payer: Self-pay | Admitting: Cardiology

## 2019-04-19 ENCOUNTER — Other Ambulatory Visit: Payer: Self-pay | Admitting: Internal Medicine

## 2019-04-26 NOTE — Telephone Encounter (Signed)
Opened in error

## 2019-04-29 ENCOUNTER — Encounter: Payer: Self-pay | Admitting: Internal Medicine

## 2019-04-29 ENCOUNTER — Telehealth: Payer: Self-pay

## 2019-04-29 ENCOUNTER — Ambulatory Visit (INDEPENDENT_AMBULATORY_CARE_PROVIDER_SITE_OTHER): Payer: Medicare Other | Admitting: Internal Medicine

## 2019-04-29 ENCOUNTER — Telehealth: Payer: Self-pay | Admitting: Internal Medicine

## 2019-04-29 DIAGNOSIS — J301 Allergic rhinitis due to pollen: Secondary | ICD-10-CM

## 2019-04-29 DIAGNOSIS — M797 Fibromyalgia: Secondary | ICD-10-CM

## 2019-04-29 DIAGNOSIS — L719 Rosacea, unspecified: Secondary | ICD-10-CM

## 2019-04-29 MED ORDER — PREDNISONE 20 MG PO TABS: 40.0000 mg | ORAL_TABLET | Freq: Every day | ORAL | 0 refills | Status: DC

## 2019-04-29 MED ORDER — BRIMONIDINE TARTRATE 0.33 % EX GEL: CUTANEOUS | 2 refills | Status: DC

## 2019-04-29 NOTE — Assessment & Plan Note (Signed)
Rx for brimonidine for the rosacea topically.

## 2019-04-29 NOTE — Assessment & Plan Note (Signed)
Continue taking otc medication and this is helping some. Will hold off on antibiotics. Advised to use saline flush.

## 2019-04-29 NOTE — Telephone Encounter (Signed)
PA approved Effective from 04/29/2019 through 04/28/2020

## 2019-04-29 NOTE — Telephone Encounter (Signed)
PA started on CoverMyMeds KEY: [id number]

## 2019-04-29 NOTE — Assessment & Plan Note (Signed)
Rx for prednisone course to help with pain. She will continue soma and has had many intolerances to medications over the years.

## 2019-04-29 NOTE — Telephone Encounter (Signed)
PA started on CoverMyMeds KEY: [id number]

## 2019-04-29 NOTE — Telephone Encounter (Signed)
Pt is calling and she needs a PA on eszopiclone . [address]

## 2019-04-29 NOTE — Progress Notes (Signed)
Virtual Visit via Video Note  I connected with Pricilla Holm S Faubert on 04/29/19 at 12:00 PM EDT by a video enabled telemedicine application and verified that I am speaking with the correct person using two identifiers.  The patient and the provider were at separate locations throughout the entire encounter.   I discussed the limitations of evaluation and management by telemedicine and the availability of in person appointments. The patient expressed understanding and agreed to proceed.  History of Present Illness: The patient is a 73 y.o. female with visit for allergies. She has taken claritin and other otc medications. She is getting more rosacea and burning in the face and skin. She is also having a sore throat and heartburn symptoms. She is having some burning on the ears. Started taking omeprazole for 2 weeks and now just doing pepcid at nigh time. Clearing throat and cough but denies SOB. Having sinus pain. Denies fevers or chills. Overall it is worsening. She also has several questions about her medications and PA for her medications. She is having worsening rosacea with lesions on her chin and near nose (she has taken some things for it without great relief, she is having some burning by her ears) and her fibromyalgia is worse (denies injury or falls, is taking her soma and this helps but some worse recently).   Observations/Objective: Appearance: normal, breathing appears normal, casual grooming, abdomen does not appear distended, throat not visualized due to poor video quality, mental status is A and O times 3 140/60  Assessment and Plan: See problem oriented charting  Follow Up Instructions: continue allergy medication, restart singulair, rx for brimonidine cream for the face  I discussed the assessment and treatment plan with the patient. The patient was provided an opportunity to ask questions and all were answered. The patient agreed with the plan and demonstrated an understanding of the  instructions.   The patient was advised to call back or seek an in-person evaluation if the symptoms worsen or if the condition fails to improve as anticipated.  [name], MD

## 2019-05-07 ENCOUNTER — Ambulatory Visit: Payer: Medicare Other

## 2019-05-07 ENCOUNTER — Encounter: Payer: Self-pay | Admitting: Internal Medicine

## 2019-05-11 DIAGNOSIS — H04123 Dry eye syndrome of bilateral lacrimal glands: Secondary | ICD-10-CM | POA: Diagnosis not present

## 2019-05-11 DIAGNOSIS — H16223 Keratoconjunctivitis sicca, not specified as Sjogren's, bilateral: Secondary | ICD-10-CM | POA: Diagnosis not present

## 2019-05-12 DIAGNOSIS — E785 Hyperlipidemia, unspecified: Secondary | ICD-10-CM | POA: Diagnosis not present

## 2019-05-12 DIAGNOSIS — R0989 Other specified symptoms and signs involving the circulatory and respiratory systems: Secondary | ICD-10-CM | POA: Diagnosis not present

## 2019-05-12 LAB — COMPREHENSIVE METABOLIC PANEL
ALT: 41 IU/L — ABNORMAL HIGH (ref 0–32)
AST: 41 IU/L — ABNORMAL HIGH (ref 0–40)
Albumin/Globulin Ratio: 2.4 — ABNORMAL HIGH (ref 1.2–2.2)
Albumin: 4.6 g/dL (ref 3.7–4.7)
Alkaline Phosphatase: 62 IU/L (ref 39–117)
BUN/Creatinine Ratio: 13 (ref 12–28)
BUN: 10 mg/dL (ref 8–27)
Bilirubin Total: 0.4 mg/dL (ref 0.0–1.2)
CO2: 25 mmol/L (ref 20–29)
Calcium: 9.5 mg/dL (ref 8.7–10.3)
Chloride: 98 mmol/L (ref 96–106)
Creatinine, Ser: 0.8 mg/dL (ref 0.57–1.00)
GFR calc Af Amer: 84 mL/min/{1.73_m2} (ref >59)
GFR calc non Af Amer: 73 mL/min/{1.73_m2} (ref >59)
Globulin, Total: 1.9 g/dL (ref 1.5–4.5)
Glucose: 93 mg/dL (ref 65–99)
Potassium: 4.5 mmol/L (ref 3.5–5.2)
Sodium: 140 mmol/L (ref 134–144)
Total Protein: 6.5 g/dL (ref 6.0–8.5)

## 2019-05-12 LAB — LIPID PANEL
Chol/HDL Ratio: 2.6 ratio (ref 0.0–4.4)
Cholesterol, Total: 283 mg/dL — ABNORMAL HIGH (ref 100–199)
HDL: 109 mg/dL (ref >39)
LDL Calculated: 146 mg/dL — ABNORMAL HIGH (ref 0–99)
Triglycerides: 140 mg/dL (ref 0–149)
VLDL Cholesterol Cal: 28 mg/dL (ref 5–40)

## 2019-05-17 ENCOUNTER — Telehealth: Payer: Self-pay

## 2019-05-17 NOTE — Telephone Encounter (Signed)

## 2019-05-18 ENCOUNTER — Ambulatory Visit (INDEPENDENT_AMBULATORY_CARE_PROVIDER_SITE_OTHER): Payer: Medicare Other | Admitting: Pharmacist Clinician (PhC)/ Clinical Pharmacy Specialist

## 2019-05-18 ENCOUNTER — Other Ambulatory Visit: Payer: Self-pay

## 2019-05-18 DIAGNOSIS — R0989 Other specified symptoms and signs involving the circulatory and respiratory systems: Secondary | ICD-10-CM

## 2019-05-18 DIAGNOSIS — E785 Hyperlipidemia, unspecified: Secondary | ICD-10-CM | POA: Diagnosis not present

## 2019-05-18 NOTE — Patient Instructions (Addendum)
Return for a a follow up appointment in   Your blood pressure today is 142/80  Check your blood pressure at home several times each week and keep record of the readings.  Take your BP meds as follows:  Continue with Bystolic and Amlodipine   Start Nexletol 180 mg once daily for your cholesterol.  After 2 weeks of samples, if you are doing well please call and we will start a prior authorization with your insurance.  [phone]   If your Nexletol is cost prohibitive, please go to [email or web address] and fill out an application for patient assistance Bring all of your meds, your BP cuff and your record of home blood pressures to your next appointment.  Exercise as you're able, try to walk approximately 30 minutes per day.  Keep salt intake to a minimum, especially watch canned and prepared boxed foods.  Eat more fresh fruits and vegetables and fewer canned items.  Avoid eating in fast food restaurants.    HOW TO TAKE YOUR BLOOD PRESSURE: . Rest 5 minutes before taking your blood pressure. .  Don't smoke or drink caffeinated beverages for at least 30 minutes before. . Take your blood pressure before (not after) you eat. . Sit comfortably with your back supported and both feet on the floor (don't cross your legs). . Elevate your arm to heart level on a table or a desk. . Use the proper sized cuff. It should fit smoothly and snugly around your bare upper arm. There should be enough room to slip a fingertip under the cuff. The bottom edge of the cuff should be 1 inch above the crease of the elbow. . Ideally, take 3 measurements at one sitting and record the average.

## 2019-05-18 NOTE — Progress Notes (Signed)
06/02/2019 Marissa [name] 1946-07-20 [id number]   HPI:  Marissa [name] is a 73 y.o. female patient of Dr Ellyn Hack, with a PMH below who presents today for hypertension and hyperlipidemia clinic follow up.  In addition to hypertension and hyperlipidemia, her medical history is significant for mitral valve prolapse, bicuspid aortic valve with stenosis, asthma, arthritis, ulcerative colitis, chronic pain and ascending aneurysm.   She was last seen in [facility] in May 2019.  In February of this year she was hospitalized for hyponatremia.  At the time of hospitalization she noted drinking large amounts of water, excessive thirst and an episode of N/V.   Her valsartan was decreased from 320 mg to 80 mg daily and the spironolactone was discontinued.   Currently she has home  health PT/OT with RN once weekly since her discharge.  She notes that she lost 10 pounds during and after hospitalization, and has managed to recently gain about 4-5 of those pounds back.   She is currently free from pain medications, after having spinal injections.  Patient still notes some dizziness with postural changes, as well as weakness and balance issues.  She is using a walker today and says she uses a cane in the house.     No clonidine in the past 3-4 weeks; one time > 254 systolic, rested, took bystolic, and BP down within 1-2 hours  Blood Pressure Goal:  130/80  Current Medications:  Amlodipine 10 mg, bystolic 10 mg  Clonidine 0.1 mg bid prn BP >   Family Hx:  No significant heart issues with parents, had 1 uncle with heart disease  Social Hx: no tobacco, no alcohol for past 6+ weeks, previously would drink 1-2 scotch/soda per day.    Diet:  Still drinking glucerna and gatordate daily, but admits to eating better  Exercise: nothing specific, has to stay indoors due to allergies and covid. Does stair laps 2-3 times per day, 2,000-3,000 steps per day per watch  Home BP readings: mostly 270-623 systolic  762/83 HR  59  Intolerances:   diurtetics - hyponatremia, urinary incontinence  Labs:  12/24/18:  Na 132, K 4.4, Glu 103, BUN <5, SCr 0.72  12/30/18:   Na 124, K 4.1, Glu 87, BUN 12, SCr 0.6  Wt Readings from Last 3 Encounters:  05/24/19 137 lb 2 oz (62.2 kg)  12/31/18 139 lb (63 kg)  12/24/18 136 lb 6.4 oz (61.9 kg)   BP Readings from Last 3 Encounters:  05/24/19 116/64  05/18/19 (!) 142/80  03/02/19 (!) 178/92   Pulse Readings from Last 3 Encounters:  05/24/19 (!) 55  03/02/19 (!) 54  01/04/19 64    Current Outpatient Medications  Medication Sig Dispense Refill  . albuterol (PROAIR HFA) 108 (90 Base) MCG/ACT inhaler INHALE 2 PUFFS INTO THE LUNGS EVERY 6 (SIX) HOURS AS NEEDED. (Patient taking differently: Inhale 2 puffs into the lungs every 6 (six) hours as needed for shortness of breath. ) 8.5 each 2  . amLODipine (NORVASC) 10 MG tablet Take 1 tablet (10 mg total) by mouth daily. 30 tablet 4  . Bempedoic Acid (NEXLETOL) 180 MG TABS Take 180 mg by mouth daily. 30 tablet 6  . Brimonidine Tartrate 0.33 % GEL Apply to face twice daily. 30 g 2  . BYSTOLIC 10 MG tablet TAKE 1 TABLET(10 MG) BY MOUTH DAILY AT 8 PM 90 tablet 0  . carisoprodol (SOMA) 350 MG tablet TAKE 1 TABLET(350 MG) BY MOUTH THREE TIMES  DAILY AS NEEDED 60 tablet 3  . cloNIDine (CATAPRES) 0.1 MG tablet Take 1 tablet (0.1 mg total) by mouth 2 (two) times daily as needed. 60 tablet 6  . cycloSPORINE (RESTASIS) 0.05 % ophthalmic emulsion Place 1 drop into both eyes 2 (two) times daily.    . Eszopiclone 3 MG TABS TAKE 1 TABLET BY MOUTH IMMEDIATELY BEFORE BEDTIME 30 tablet 5  . famotidine (PEPCID) 20 MG tablet Take 20 mg by mouth as needed.     [name] Kitchen FLUoxetine (PROZAC) 40 MG capsule Take 1 capsule (40 mg total) by mouth 2 (two) times daily. 180 capsule 0  . levothyroxine (SYNTHROID) 125 MCG tablet TAKE 1 TABLET(125 MCG) BY MOUTH DAILY 90 tablet 1  . MINIVELLE 0.05 MG/24HR patch Place 1 patch onto the skin 2 (two) times a week.   3  .  montelukast (SINGULAIR) 10 MG tablet TAKE 1 TABLET(10 MG) BY MOUTH AT BEDTIME (Patient taking differently: Take 10 mg by mouth at bedtime. ) 90 tablet 1  . Multiple Vitamin (MULTIVITAMIN WITH MINERALS) TABS tablet Take 1 tablet by mouth daily. 30 tablet 0  . MYRBETRIQ 50 MG TB24 tablet TAKE 1 TABLET(50 MG) BY MOUTH DAILY 90 tablet 1  . omeprazole (PRILOSEC) 20 MG capsule Take 20 mg by mouth daily as needed.    [name] Kitchen Respiratory Therapy Supplies (FLUTTER) DEVI Use as directed 1 each 0   No current facility-administered medications for this visit.     Allergies  Allergen Reactions  . Fentanyl Shortness Of Breath and Rash  . Rosuvastatin Anaphylaxis, Anxiety, Hives, Itching, Nausea And Vomiting, Other (See Comments), Palpitations, Photosensitivity and Shortness Of Breath  . Amoxicillin Diarrhea    Severe diarrhea, rash to vaginal area with swelling   . Hctz [Hydrochlorothiazide] Other (See Comments)    HypoNatremia  . Hydralazine     Weak, sweats, red skin  . Codeine Other (See Comments)    makes her hyper  . Conjugated Estrogens Itching and Rash  . Erythromycin Rash    had a rash with emycin, has done ok with other meds in it's class  . Piroxicam Itching and Rash    Feldene    Past Medical History:  Diagnosis Date  . Arthritis   . Asthma   . Bicuspid aortic valve 05/2017   Likely functional bicuspid aortic valve with sclerosis and no stenosis.  . Bursitis   . Cataract    mild  . Dysthymia   . Hemorrhoids   . Hx of ulcerative colitis    per dr Arnoldo Morale as per pt.  . Hyperlipidemia   . Hypertension    managed - labile.  . Migraine   . Scoliosis   . 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 Spinal stenosis   . Thoracic aortic aneurysm (HCC)    Stable 4.2-4.3 cm (followed by Dr. Roxan Hockey)  . Thyroid disease     Blood pressure (!) 142/80.   Hyperlipidemia with target LDL less than 100 Patient with hyperlipidemia, has been hesitant about using PCSK 9 inhibitors and has statin intolerance.  She is agreeable today to try a sample of Nexletol (bempedoic acid).  Will give her a 2-3 week supply and reach out to her later to see if she tolerates.    Labile hypertension Patient with hypertension that has been hard to control, in part due to medication  sensitivities and electrolyte imbalances.  For now she should continue with amlodipine 10 mg and bystolic 10 mg both once daily, with prn clonidine.  She know to call with any concerns.   Tommy Medal PharmD CPP [facility] [address] [place], [place] [address] [phone]

## 2019-05-19 ENCOUNTER — Other Ambulatory Visit: Payer: Self-pay | Admitting: Internal Medicine

## 2019-05-21 ENCOUNTER — Telehealth: Payer: Self-pay | Admitting: Cardiology

## 2019-05-21 NOTE — Telephone Encounter (Signed)
° ° °  COVID-19 Pre-Screening Questions:   In the past 7 to 10 days have you had a cough,  shortness of breath, headache, congestion, fever (100 or greater) body aches, chills, sore throat, or sudden loss of taste or sense of smell? noHave you been around anyone with known Covid 19.  Have you been around anyone who is awaiting Covid 19 test results in the past 7 to 10 days? no Have you been around anyone who has been exposed to Covid 19, or has mentioned symptoms of Covid 19 within the past 7 to 10 days? no If you have any concerns/questions about symptoms patients report during screening (either on the phone or at threshold). Contact the provider seeing the patient or DOD for further guidance.  If neither are available contact a member of the leadership team.

## 2019-05-24 ENCOUNTER — Encounter: Payer: Self-pay | Admitting: Cardiology

## 2019-05-24 ENCOUNTER — Ambulatory Visit (INDEPENDENT_AMBULATORY_CARE_PROVIDER_SITE_OTHER): Payer: Medicare Other | Admitting: Cardiology

## 2019-05-24 ENCOUNTER — Other Ambulatory Visit: Payer: Self-pay

## 2019-05-24 VITALS — BP 116/64 | HR 55 | Temp 97.0°F | Ht 64.0 in | Wt 137.1 lb

## 2019-05-24 DIAGNOSIS — E785 Hyperlipidemia, unspecified: Secondary | ICD-10-CM

## 2019-05-24 DIAGNOSIS — R0989 Other specified symptoms and signs involving the circulatory and respiratory systems: Secondary | ICD-10-CM | POA: Diagnosis not present

## 2019-05-24 DIAGNOSIS — I350 Nonrheumatic aortic (valve) stenosis: Secondary | ICD-10-CM

## 2019-05-24 DIAGNOSIS — I77810 Thoracic aortic ectasia: Secondary | ICD-10-CM

## 2019-05-24 NOTE — Patient Instructions (Addendum)
Medication Instructions:  No changes If you need a refill on your cardiac medications before your next appointment, please call your pharmacy.   Lab work:  Not needed.  Testing/Procedures: Not needed  Follow-Up: At [facility], you and your health needs are our priority.  As part of our continuing mission to provide you with exceptional heart care, we have created designated Provider Care Teams.  These Care Teams include your primary Cardiologist (physician) and Advanced Practice Providers (APPs -  Physician Assistants and Nurse Practitioners) who all work together to provide you with the care you need, when you need it. . You will need a follow up appointment in 6 months- jan 2021.  Please call our office 2 months in advance to schedule this appointment.  You may see [name], MD or one of the following Advanced Practice Providers on your designated Care Team:   . Rosaria Ferries, PA-C . Jory Sims, DNP, ANP  Any Other Special Instructions Will Be Listed Below (If Applicable).

## 2019-05-24 NOTE — Progress Notes (Signed)
PCP: [name], MD  Clinic Note: Chief Complaint  Patient presents with  . Follow-up    2-3 month f/u. Meds reviewed verbally with pt.  . Hypertension    Doing much better  . Hyperlipidemia    Chest started on new medicine    HPI: Marissa [name] is a 73 y.o. female with a PMH notable for chronic pain syndrome, hypertension, hyperlipidemia palpitations with mild ascending aortic dilation and mild aortic stenosis who presents today for 2-109-monthfollow-up.  --Of note, she does not have evidence on echo of mitral prolapse or bicuspid aortic valve that was previously discussed.  Also the ascending aorta is not aneurysmal.  Most notably, we are monitoring labile hypertension. --She has been intolerant of most diuretics, eventually had hospitalization for hyponatremia in February 2019 resulting in her spironolactone been discontinued.  (However she also has noted excessive free water intake)  Marissa [name] was last seen on Mar 02, 2019 for Telehealth visit as a follow-up from visit with [facility] -She noted that she had to take clonidine 3 times in the last 3 days prior to that visit after initially doing very well.  Occasionally has some headaches left-sided chest pain with high blood pressure. -->  Episodes of hypertension began with multiple GI issues.  We have stopped statin because of multiple issues -> considered Praluent/Repatha  Was concerned there was some side effects from ARB.  Hope was to convert to amlodipine.  If after 3 weeks, no change, then would go back to ARB.  Plan was to reassess echo in 1 to 2 years for aortic stenosis.  Recent Hospitalizations: None  Studies Personally Reviewed - (if available, images/films reviewed: From Epic Chart or Care Everywhere)  No new tests  Interval History: Marissa [name] returns today overall actually doing quite well.  She says that her blood pressures have been doing really well of late.  What she  has noted though is that her blood pressure goes up when she has pain she has issues with chronic pain.  Thankfully though her pain control is been relatively stable and she is only maybe used clonidine once a week for elevated blood pressures.  She is now taking amlodipine again having had issues with ARB.  She is doing well with her Bystolic. She actually also has started taking Nexletol for lipid control.  She is due to have her lipids followed by [facility] -> run by our clinical pharmacist.  Cardiovascular Review of Symptoms: No chest pain or shortness of breath with rest or exertion. No PND, orthopnea or edema.  Only rare palpitations, and usually associated with anxiety. No lightheadedness, dizziness, weakness or syncope/near syncope. No TIA/amaurosis fugax symptoms. No claudication.  ROS: A comprehensive was performed. Review of Systems  Constitutional: Negative for malaise/fatigue (This seems to be much better now that her blood pressures been stabilized).  HENT: Negative for congestion and nosebleeds.   Respiratory: Negative for cough, shortness of breath and wheezing.   Gastrointestinal: Negative for blood in stool, heartburn, melena, nausea and vomiting.  Genitourinary: Negative for hematuria.  Musculoskeletal: Positive for joint pain and myalgias. Negative for falls.       Has chronic pain issues  Neurological: Positive for dizziness (Off and on monitor blood pressure is labile.) and headaches (Only when her blood pressure goes up). Negative for focal weakness and weakness.  Psychiatric/Behavioral: Negative for depression and memory loss. The patient is nervous/anxious. The  patient does not have insomnia.     The patient does not have symptoms concerning for COVID-19 infection (fever, chills, cough, or new shortness of breath).  The patient is practicing social distancing.   COVID-19 Education: The signs and symptoms of COVID-19 were discussed with the patient and how  to seek care for testing (follow up with PCP or arrange E-visit).   The importance of social distancing was discussed today.  She is very much worried about her immune system risks and therefore has really been staying at home.  Has not seen her grandson in 3 months.  Despite this, she seems to be doing okay with the social isolation.  The only thing she misses is being with her grandson and daughter.   I have reviewed and (if needed) personally updated the patient's problem list, medications, allergies, past medical and surgical history, social and family history.   Past Medical History:  Diagnosis Date  . Arthritis   . Asthma   . Bicuspid aortic valve 05/2017   Likely functional bicuspid aortic valve with sclerosis and no stenosis.  . Bursitis   . Cataract    mild  . Dysthymia   . Hemorrhoids   . Hx of ulcerative colitis    per dr Arnoldo Morale as per pt.  . Hyperlipidemia   . Hypertension    managed - labile.  . Migraine   . Scoliosis   . 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 Spinal stenosis   . Thoracic aortic aneurysm (HCC)    Stable 4.2-4.3 cm (followed by Dr. Roxan Hockey)  . Thyroid disease     Past Surgical History:  Procedure Laterality Date  . ABDOMINAL HYSTERECTOMY    . BUNIONECTOMY    . Cardiac Event Monitor  07/2017   Overall relatively normal.  Normal sinus rhythm with rare bradycardia and tachycardia.  Heart rate ranged from 55-110 bpm.  Occasional PACs and PVCs, every single 1 was felt.  No arrhythmias other than one short 4 beat run of PACs.  . COLONOSCOPY  02-04-2005   all normal   . FOOT SURGERY     3 pins in toes   . HAND SURGERY     left thumb joint resection  . nasal revision    . NM MYOVIEW LTD  07/2017   LOW RISK study.  No ischemia or infarction.  EF greater than 65%.   [name] Kitchen SINUS IRRIGATION    . TONSILLECTOMY AND ADENOIDECTOMY    .  TRANSTHORACIC ECHOCARDIOGRAM  06/'1, 8/'19   a) Moderate LVH. Normal EF 60-65%. Normal diastolic parameters. --> Difficult to fully visualize the aortic valve. Cannot exclude bicuspid valve. Mild aortic stenosis noted. No PFO. Mildly dilated left atrium. Trivial MR. No comment on mitral valve prolapse. Moderately dilated ascending aorta.;; b) F/u Echo To evaluate the aortic valve. -- Bicuspid AoV - mildly thickened / calcified. - No stenosis  . TRANSTHORACIC ECHOCARDIOGRAM  11/2018   Normal LV size and function.  EF 60-65%.  GR 1 DD.  Normal RV size and function.  Moderate aortic valve calcification with mild Aortic Stenosis.  Normal LV size and function.  EF 60-65%.  GR 1 DD.  Normal RV size and function.  Moderate aortic calcification with mild stenosis.  Mild dilation of ascending aorta ~4.2 mm    Current Meds  Medication Sig  . albuterol (  PROAIR HFA) 108 (90 Base) MCG/ACT inhaler INHALE 2 PUFFS INTO THE LUNGS EVERY 6 (SIX) HOURS AS NEEDED. (Patient taking differently: Inhale 2 puffs into the lungs every 6 (six) hours as needed for shortness of breath. )  . amLODipine (NORVASC) 10 MG tablet Take 1 tablet (10 mg total) by mouth daily.  . Bempedoic Acid (NEXLETOL) 180 MG TABS Take by mouth daily.  . Brimonidine Tartrate 0.33 % GEL Apply to face twice daily.  [name] Kitchen BYSTOLIC 10 MG tablet TAKE 1 TABLET(10 MG) BY MOUTH DAILY AT 8 PM  . carisoprodol (SOMA) 350 MG tablet TAKE 1 TABLET(350 MG) BY MOUTH THREE TIMES DAILY AS NEEDED  . cloNIDine (CATAPRES) 0.1 MG tablet Take 1 tablet (0.1 mg total) by mouth 2 (two) times daily as needed.  . cycloSPORINE (RESTASIS) 0.05 % ophthalmic emulsion Place 1 drop into both eyes 2 (two) times daily.  . Eszopiclone 3 MG TABS TAKE 1 TABLET BY MOUTH IMMEDIATELY BEFORE BEDTIME  . famotidine (PEPCID) 20 MG tablet Take 20 mg by mouth as needed.   [name] Kitchen FLUoxetine (PROZAC) 40 MG capsule Take 1 capsule (40 mg total) by mouth 2 (two) times daily.  [name] Kitchen MINIVELLE 0.05 MG/24HR patch Place  1 patch onto the skin 2 (two) times a week.   . montelukast (SINGULAIR) 10 MG tablet TAKE 1 TABLET(10 MG) BY MOUTH AT BEDTIME (Patient taking differently: Take 10 mg by mouth at bedtime. )  . Multiple Vitamin (MULTIVITAMIN WITH MINERALS) TABS tablet Take 1 tablet by mouth daily.  [name] Kitchen MYRBETRIQ 50 MG TB24 tablet TAKE 1 TABLET(50 MG) BY MOUTH DAILY  . omeprazole (PRILOSEC) 20 MG capsule Take 20 mg by mouth daily as needed.  [name] Kitchen Respiratory Therapy Supplies (FLUTTER) DEVI Use as directed    Allergies  Allergen Reactions  . Fentanyl Shortness Of Breath and Rash  . Rosuvastatin Anaphylaxis, Anxiety, Hives, Itching, Nausea And Vomiting, Other (See Comments), Palpitations, Photosensitivity and Shortness Of Breath  . Amoxicillin Diarrhea    Severe diarrhea, rash to vaginal area with swelling   . Hctz [Hydrochlorothiazide] Other (See Comments)    HypoNatremia  . Hydralazine     Weak, sweats, red skin  . Codeine Other (See Comments)    makes her hyper  . Conjugated Estrogens Itching and Rash  . Erythromycin Rash    had a rash with emycin, has done ok with other meds in it's class  . Piroxicam Itching and Rash    Feldene    Social History   Tobacco Use  . Smoking status: Never Smoker  . Smokeless tobacco: Never Used  Substance Use Topics  . Alcohol use: Yes    Alcohol/week: 0.0 standard drinks    Comment: occ   . Drug use: No   Social History   Social History Narrative   She drinks Scotch Whisky 2-3x day to help with her pain (one shot around 3-4 pm then every few hours until night); 1 cup coffee in am; no tobacco    family history includes Arthritis in her mother; Breast cancer in her maternal grandmother, paternal aunt, and paternal grandmother; Diabetes in her paternal aunt; Heart disease in her maternal grandfather and maternal uncle; Leukemia in her father.  Wt Readings from Last 3 Encounters:  05/24/19 137 lb 2 oz (62.2 kg)  12/31/18 139 lb (63 kg)  12/24/18 136 lb 6.4 oz  (61.9 kg)    PHYSICAL EXAM BP 116/64 (BP Location: Left Arm, Patient Position: Sitting, Cuff Size: Normal)   Pulse (!) 55  Temp (!) 97 F (36.1 C)   Ht 5' 4"  (1.626 m)   Wt 137 lb 2 oz (62.2 kg)   SpO2 96%   BMI 23.54 kg/m  Physical Exam  Constitutional: She is oriented to person, place, and time. She appears well-developed and well-nourished. No distress.  Healthy-appearing.  Well-groomed.  HENT:  Head: Normocephalic and atraumatic.  Wearing mask for COVID  Neck: Normal range of motion. Neck supple. No hepatojugular reflux and no JVD present. Carotid bruit is not present (Radiated aortic murmur).  Cardiovascular: Regular rhythm and intact distal pulses.  No extrasystoles are present. Bradycardia present. PMI is not displaced. Exam reveals no gallop and no friction rub.  Murmur heard.  Harsh crescendo-decrescendo early systolic murmur is present with a grade of 1/6 at the upper right sternal border radiating to the neck. No HSM heard to suggest MR.  No midsystolic click. Pulmonary/Chest: Effort normal and breath sounds normal. No respiratory distress. She has no wheezes. She has no rales.  Abdominal: Soft. Bowel sounds are normal. She exhibits no distension. There is no abdominal tenderness. There is no rebound.  Musculoskeletal: Normal range of motion.        General: No edema.  Neurological: She is alert and oriented to person, place, and time.  Skin: Skin is warm and dry.  Psychiatric: She has a normal mood and affect. Her behavior is normal. Judgment and thought content normal.  Vitals reviewed.    Adult ECG Report  Rate: 55 ;  Rhythm: normal sinus rhythm and Left axis deviation (-42).  Cannot exclude septal MI, age indeterminate.  Otherwise normal axis, intervals and durations.;   Narrative Interpretation: Stable EKG  Other studies Reviewed: Additional studies/ records that were reviewed today include:  Recent Labs:   Lab Results  Component Value Date   CREATININE 0.80  05/12/2019   BUN 10 05/12/2019   NA 140 05/12/2019   K 4.5 05/12/2019   CL 98 05/12/2019   CO2 25 05/12/2019   Lab Results  Component Value Date   CHOL 283 (H) 05/12/2019   HDL 109 05/12/2019   LDLCALC 146 (H) 05/12/2019   LDLDIRECT 145.7 09/01/2013   TRIG 140 05/12/2019   CHOLHDL 2.6 05/12/2019    ASSESSMENT / PLAN: Problem List Items Addressed This Visit    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 Roxan Hockey.  Anticipate CT scan to be ordered. Continue blood pressure control.  Continue to follow mild aortic stenosis.  Continue other cardiovascular risk modification with lipid management.      Relevant Medications   Bempedoic Acid (NEXLETOL) 180 MG TABS   Other Relevant Orders   EKG 12-Lead (Completed)   Mild aortic stenosis (Chronic)    Last echo was in February of this year.  I think we can wait until 2022 to recheck. Has a soft murmur.      Relevant Medications   Bempedoic Acid (NEXLETOL) 180 MG TABS   Other Relevant Orders   EKG 12-Lead (Completed)   Labile hypertension - Primary (Chronic)    Was intolerant of ARB having GI issues.  Had electrolyte issues on diuretics.  Is now on (back on) amlodipine in addition to Bystolic which she seems to doing well with. She has PRN clonidine but is using it with less frequency. It seems like a lot of the spells of hypertension are related to pain.  As long as her pain is controlled her blood pressure is better.  Relevant Medications   Bempedoic Acid (NEXLETOL) 180 MG TABS   Other Relevant Orders   EKG 12-Lead (Completed)   Hyperlipidemia with target LDL less than 100 (Chronic)    She did not want to try Repatha or Praluent, but was willing to try Bempedoic Acid (Nexletol) --would anticipate that she should have her lipids checked in the next 3 to 6 months.  Has been statin intolerant.      Relevant Medications   Bempedoic Acid (NEXLETOL) 180 MG TABS      I spent a total of  7mnutes with the patient and chart review. >  50% of the time was spent in direct patient consultation.   Current medicines are reviewed at length with the patient today.  (+/- concerns) n/a  Patient Instructions  Medication Instructions:  No changes If you need a refill on your cardiac medications before your next appointment, please call your pharmacy.   Lab work:  Not needed.  Testing/Procedures: Not needed  Follow-Up: [facility] and your health needs are our priority.  As part of our continuing mission to provide you with exceptional heart care, we have created designated Provider Care Teams.  These Care Teams include your primary Cardiologist (physician) and Advanced Practice Providers (APPs -  Physician Assistants and Nurse Practitioners) who all work together to provide you with the care you need, when you need it. . You will need a follow up appointment in 6 months- jan 2021.  Please call our office 2 months in advance to schedule this appointment.  You may see DGlenetta Hew MD or one of the following Advanced Practice Providers on your designated Care Team:   . RRosaria Ferries PA-C . KJory Sims DNP, ANP  Any Other Special Instructions Will Be Listed Below (If Applicable).    Studies Ordered:   Orders Placed This Encounter  Procedures  . EKG 12-Lead      DGlenetta Hew M.D., M.S. Interventional Cardiologist   Pager # [phone] # [phone] [place] [address]  Thank you for choosing [facility]!

## 2019-05-26 ENCOUNTER — Encounter: Payer: Self-pay | Admitting: Cardiology

## 2019-05-26 NOTE — Assessment & Plan Note (Signed)
Last echo was in February of this year.  I think we can wait until 2022 to recheck. Has a soft murmur.

## 2019-05-26 NOTE — Assessment & Plan Note (Signed)
Was intolerant of ARB having GI issues.  Had electrolyte issues on diuretics.  Is now on (back on) amlodipine in addition to Bystolic which she seems to doing well with. She has PRN clonidine but is using it with less frequency. It seems like a lot of the spells of hypertension are related to pain.  As long as her pain is controlled her blood pressure is better.

## 2019-05-26 NOTE — Assessment & Plan Note (Signed)
She did not want to try Repatha or Praluent, but was willing to try Bempedoic Acid (Nexletol) --would anticipate that she should have her lipids checked in the next 3 to 6 months.  Has been statin intolerant.

## 2019-05-26 NOTE — Assessment & Plan Note (Signed)
Has been followed by Dr. Roxan Hockey.  Anticipate CT scan to be ordered. Continue blood pressure control.  Continue to follow mild aortic stenosis.  Continue other cardiovascular risk modification with lipid management.

## 2019-05-28 ENCOUNTER — Other Ambulatory Visit: Payer: Self-pay | Admitting: Internal Medicine

## 2019-05-28 NOTE — Telephone Encounter (Signed)
Control database checked last refill: 04/29/2019 30 tabs LOV: 04/29/2019 [id number]

## 2019-06-02 ENCOUNTER — Other Ambulatory Visit: Payer: Self-pay | Admitting: Pharmacist Clinician (PhC)/ Clinical Pharmacy Specialist

## 2019-06-02 ENCOUNTER — Encounter: Payer: Self-pay | Admitting: Pharmacist Clinician (PhC)/ Clinical Pharmacy Specialist

## 2019-06-02 MED ORDER — NEXLETOL 180 MG PO TABS: 180.0000 mg | ORAL_TABLET | Freq: Every day | ORAL | 6 refills | Status: DC

## 2019-06-02 NOTE — Assessment & Plan Note (Signed)
Patient with hyperlipidemia, has been hesitant about using PCSK 9 inhibitors and has statin intolerance.  She is agreeable today to try a sample of Nexletol (bempedoic acid).  Will give her a 2-3 week supply and reach out to her later to see if she tolerates.

## 2019-06-02 NOTE — Assessment & Plan Note (Signed)
Patient with hypertension that has been hard to control, in part due to medication sensitivities and electrolyte imbalances.  For now she should continue with amlodipine 10 mg and bystolic 10 mg both once daily, with prn clonidine.  She know to call with any concerns.

## 2019-06-03 ENCOUNTER — Other Ambulatory Visit: Payer: Self-pay | Admitting: Thoracic Surgery (Cardiothoracic Vascular Surgery)

## 2019-06-03 DIAGNOSIS — I712 Thoracic aortic aneurysm, without rupture, unspecified: Secondary | ICD-10-CM

## 2019-06-10 ENCOUNTER — Other Ambulatory Visit: Payer: Self-pay

## 2019-06-10 MED ORDER — NEXLETOL 180 MG PO TABS: 180.0000 mg | ORAL_TABLET | Freq: Every day | ORAL | 6 refills | Status: DC

## 2019-06-11 ENCOUNTER — Other Ambulatory Visit: Payer: Self-pay

## 2019-06-17 DIAGNOSIS — H04123 Dry eye syndrome of bilateral lacrimal glands: Secondary | ICD-10-CM | POA: Diagnosis not present

## 2019-06-18 ENCOUNTER — Other Ambulatory Visit: Payer: Self-pay | Admitting: Internal Medicine

## 2019-06-25 ENCOUNTER — Other Ambulatory Visit: Payer: Self-pay | Admitting: Internal Medicine

## 2019-06-25 NOTE — Telephone Encounter (Signed)
Control database checked last refill: 04/16/2019 60 tabs LOV:04/29/2019 NOV: none

## 2019-07-07 ENCOUNTER — Other Ambulatory Visit: Payer: Self-pay | Admitting: Thoracic Surgery (Cardiothoracic Vascular Surgery)

## 2019-07-15 ENCOUNTER — Ambulatory Visit
Admission: RE | Admit: 2019-07-15 | Discharge: 2019-07-15 | Disposition: A | Discharge status: Home / Self Care | Payer: Medicare Other | Source: Ambulatory Visit | Attending: Thoracic Surgery (Cardiothoracic Vascular Surgery) | Admitting: Thoracic Surgery (Cardiothoracic Vascular Surgery)

## 2019-07-15 ENCOUNTER — Other Ambulatory Visit: Payer: Self-pay

## 2019-07-15 DIAGNOSIS — I712 Thoracic aortic aneurysm, without rupture, unspecified: Secondary | ICD-10-CM

## 2019-07-15 MED ORDER — GADOBENATE DIMEGLUMINE 529 MG/ML IV SOLN
12.0000 mL | Freq: Once | INTRAVENOUS | Status: AC | PRN
  Administered 2019-07-15: 12 mL via INTRAVENOUS

## 2019-07-16 ENCOUNTER — Encounter: Payer: Self-pay | Admitting: Internal Medicine

## 2019-07-20 ENCOUNTER — Ambulatory Visit: Payer: Medicare Other | Admitting: Thoracic Surgery (Cardiothoracic Vascular Surgery)

## 2019-07-21 ENCOUNTER — Telehealth: Payer: Self-pay | Admitting: Pharmacist Clinician (PhC)/ Clinical Pharmacy Specialist

## 2019-07-21 ENCOUNTER — Other Ambulatory Visit: Payer: Self-pay | Admitting: Pharmacist Clinician (PhC)/ Clinical Pharmacy Specialist

## 2019-07-21 DIAGNOSIS — E785 Hyperlipidemia, unspecified: Secondary | ICD-10-CM

## 2019-07-21 DIAGNOSIS — I10 Essential (primary) hypertension: Secondary | ICD-10-CM

## 2019-07-21 MED ORDER — EZETIMIBE 10 MG PO TABS: 10.0000 mg | ORAL_TABLET | Freq: Every day | ORAL | 3 refills | Status: DC

## 2019-07-21 NOTE — Telephone Encounter (Signed)
Patient insurance denied Nexletol, stated patient had not tried/failed ezetimibe, fenofibrate or gemfibrozil.    Called patient, she had received same letter.  Explained that only ezetimibe was to decrease LDL cholesterol, the other two are more for triglycerides.  She is willing to try the ezetimibe and repeat labs in 2 months.  At that time we can re-submit PA and get Nexlizet for her.    Rx sent to [facility], [place]  Orders mailed to patient to repeat lipid panel in 2 months

## 2019-07-23 NOTE — Progress Notes (Addendum)
Subjective:   Marissa [name] is a 73 y.o. female who presents for Medicare Annual (Subsequent) preventive examination.  Review of Systems:  I connected with patient by a telephone and verified that I am speaking with the correct person using two identifiers. Patient stated full name and DOB. Patient gave permission to continue with telephonic visit. Patient's location was at home and Nurse's location was at [place] office.     Sleep patterns: gets up 1-2 times nightly to void and sleeps 6-7 hours nightly.    Home Safety/Smoke Alarms: Feels safe in home. Smoke alarms in place.  Living environment; residence and Firearm Safety: 2-story house. Lives with husband, no needs for DME, good support system Seat Belt Safety/Bike Helmet: Wears seat belt.     Objective:     Vitals: There were no vitals taken for this visit.  There is no height or weight on file to calculate BMI.  Advanced Directives 07/26/2019 12/22/2018 12/21/2018 07/24/2018 07/14/2017 01/31/2016 06/20/2015  Does Patient Have a Medical Advance Directive? Yes No No Yes Yes Yes Yes  Type of Paramedic of [place];Living will - - [place];Living will [place];Living will [place];Living will -  Copy of [place] in Chart? No - copy requested - - No - copy requested No - copy requested - Yes  Would patient like information on creating a medical advance directive? - No - Patient declined No - Patient declined - - - -    Tobacco Social History   Tobacco Use  Smoking Status Never Smoker  Smokeless Tobacco Never Used     Counseling given: Not Answered  Past Medical History:  Diagnosis Date  . Arthritis   . Asthma   . Bicuspid aortic valve 05/2017   Likely functional bicuspid aortic valve with sclerosis and no stenosis.  . Bursitis   . Cataract    mild  . Dysthymia   . Hemorrhoids   . Hx of ulcerative colitis    per dr  Arnoldo Morale as per pt.  . Hyperlipidemia   . Hypertension    managed - labile.  . Migraine   . Scoliosis   . 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 Spinal stenosis   . Thoracic aortic aneurysm (HCC)    Stable 4.2-4.3 cm (followed by Dr. Roxan Hockey)  . Thyroid disease    Past Surgical History:  Procedure Laterality Date  . ABDOMINAL HYSTERECTOMY    . BUNIONECTOMY    . Cardiac Event Monitor  07/2017   Overall relatively normal.  Normal sinus rhythm with rare bradycardia and tachycardia.  Heart rate ranged from 55-110 bpm.  Occasional PACs and PVCs, every single 1 was felt.  No arrhythmias other than one short 4 beat run of PACs.  . COLONOSCOPY  02-04-2005   all normal   . FOOT SURGERY     3 pins in toes   . HAND SURGERY     left thumb joint resection  . nasal revision    . NM MYOVIEW LTD  07/2017   LOW RISK study.  No ischemia or infarction.  EF greater than 65%.   [name] Kitchen SINUS IRRIGATION    . TONSILLECTOMY AND ADENOIDECTOMY    . TRANSTHORACIC ECHOCARDIOGRAM  06/'1, 8/'19   a) Moderate LVH. Normal EF 60-65%. Normal  diastolic parameters. --> Difficult to fully visualize the aortic valve. Cannot exclude bicuspid valve. Mild aortic stenosis noted. No PFO. Mildly dilated left atrium. Trivial MR. No comment on mitral valve prolapse. Moderately dilated ascending aorta.;; b) F/u Echo To evaluate the aortic valve. -- Bicuspid AoV - mildly thickened / calcified. - No stenosis  . TRANSTHORACIC ECHOCARDIOGRAM  11/2018   Normal LV size and function.  EF 60-65%.  GR 1 DD.  Normal RV size and function.  Moderate aortic valve calcification with mild Aortic Stenosis.  Normal LV size and function.  EF 60-65%.  GR 1 DD.  Normal RV size and function.  Moderate aortic calcification with mild stenosis.  Mild dilation of ascending aorta ~4.2 mm   Family History  Problem Relation Age of Onset   . Arthritis Mother   . Leukemia Father   . Heart disease Maternal Uncle   . Breast cancer Paternal Aunt   . Diabetes Paternal Aunt   . Breast cancer Maternal Grandmother   . Heart disease Maternal Grandfather   . Breast cancer Paternal Grandmother   . Colon cancer Neg Hx    Social History   Socioeconomic History  . Marital status: Married    Spouse name: Not on file  . Number of children: 2  . Years of education: Not on file  . Highest education level: Not on file  Occupational History  . Occupation: retired  Scientific laboratory technician  . Financial resource strain: Not hard at all  . Food insecurity    Worry: Never true    Inability: Never true  . Transportation needs    Medical: No    Non-medical: No  Tobacco Use  . Smoking status: Never Smoker  . Smokeless tobacco: Never Used  Substance and Sexual Activity  . Alcohol use: Yes    Alcohol/week: 0.0 standard drinks    Comment: occ   . Drug use: No  . Sexual activity: Not Currently  Lifestyle  . Physical activity    Days per week: 0 days    Minutes per session: 0 min  . Stress: Not at all  Relationships  . Social connections    Talks on phone: More than three times a week    Gets together: More than three times a week    Attends religious service: 1 to 4 times per year    Active member of club or organization: Yes    Attends meetings of clubs or organizations: More than 4 times per year    Relationship status: Married  Other Topics Concern  . Not on file  Social History Narrative  . Not on file    Outpatient Encounter Medications as of 07/26/2019  Medication Sig  . albuterol (PROAIR HFA) 108 (90 Base) MCG/ACT inhaler INHALE 2 PUFFS INTO THE LUNGS EVERY 6 (SIX) HOURS AS NEEDED. (Patient taking differently: Inhale 2 puffs into the lungs every 6 (six) hours as needed for shortness of breath. )  . BYSTOLIC 10 MG tablet TAKE 1 TABLET(10 MG) BY MOUTH DAILY AT 8 PM  . carisoprodol (SOMA) 350 MG tablet TAKE 1 TABLET(350 MG) BY  MOUTH THREE TIMES DAILY AS NEEDED  . cloNIDine (CATAPRES) 0.1 MG tablet Take 1 tablet (0.1 mg total) by mouth 2 (two) times daily as needed.  . cycloSPORINE (RESTASIS) 0.05 % ophthalmic emulsion Place 1 drop into both eyes 2 (two) times daily.  . Eszopiclone 3 MG TABS TAKE 1 TABLET BY MOUTH IMMEDIATELY BEFORE BEDTIME  . ezetimibe (ZETIA) 10 MG  tablet Take 1 tablet (10 mg total) by mouth daily.  . famotidine (PEPCID) 20 MG tablet Take 20 mg by mouth as needed.   [name] Kitchen FLUoxetine (PROZAC) 40 MG capsule Take 1 capsule (40 mg total) by mouth 2 (two) times daily.  [name] Kitchen levothyroxine (SYNTHROID) 125 MCG tablet TAKE 1 TABLET(125 MCG) BY MOUTH DAILY  . MINIVELLE 0.05 MG/24HR patch Place 1 patch onto the skin 2 (two) times a week.   . montelukast (SINGULAIR) 10 MG tablet TAKE 1 TABLET(10 MG) BY MOUTH AT BEDTIME  . Multiple Vitamin (MULTIVITAMIN WITH MINERALS) TABS tablet Take 1 tablet by mouth daily.  [name] Kitchen MYRBETRIQ 50 MG TB24 tablet TAKE 1 TABLET(50 MG) BY MOUTH DAILY  . omeprazole (PRILOSEC) 20 MG capsule Take 20 mg by mouth daily as needed.  [name] Kitchen amLODipine (NORVASC) 10 MG tablet Take 1 tablet (10 mg total) by mouth daily.  [name] Kitchen Respiratory Therapy Supplies (FLUTTER) DEVI Use as directed (Patient not taking: Reported on 07/26/2019)  . [DISCONTINUED] Bempedoic Acid (NEXLETOL) 180 MG TABS Take 180 mg by mouth daily.  . [DISCONTINUED] Brimonidine Tartrate 0.33 % GEL Apply to face twice daily.   No facility-administered encounter medications on file as of 07/26/2019.     Activities of Daily Living In your present state of health, do you have any difficulty performing the following activities: 07/26/2019 12/22/2018  Hearing? N N  Vision? N N  Difficulty concentrating or making decisions? N N  Walking or climbing stairs? N Y  Dressing or bathing? N N  Doing errands, shopping? N N  Preparing Food and eating ? N -  Using the Toilet? N -  In the past six months, have you accidently leaked urine? N -  Do you have  problems with loss of bowel control? N -  Managing your Medications? N -  Managing your Finances? N -  Housekeeping or managing your Housekeeping? N -  Some recent data might be hidden    Patient Care Team: [name], MD as PCP - General (Internal Medicine) [name], MD as PCP - Cardiology (Cardiology) Melrose Nakayama, MD as Consulting Physician (Cardiothoracic Surgery) [name], MD as Consulting Physician (Cardiology) Tanda Rockers, MD as Consulting Physician (Pulmonary Disease)    Assessment:   This is a routine wellness examination for Marissa [name]. Physical assessment deferred to PCP.   Exercise Activities and Dietary recommendations Current Exercise Habits: The patient does not participate in regular exercise at present Discussed stretching and doing exercises in the middle of the bed and while sitting in the chair to maintain strength.   Diet (meal preparation, eat out, water intake, caffeinated beverages, dairy products, fruits and vegetables): in general, a "healthy" diet    Reports poor appetite at times and she drinks nutritional supplements.  Encouraged patient to maintain daily water and healthy fluid intake.   Goals    . Blood Pressure < 130/80    . LDL CALC < 100    . Stay as healthy as possible     Focus on my health, stay active, enjoy life, be active in my grand-child's life, and grow old with my husband.        Fall Risk Fall Risk  07/26/2019 07/24/2018 07/14/2017 03/28/2016 06/20/2015  Falls in the past year? 1 No Yes Yes No  Number falls in past yr: 1 - 2 or more 2 or more -  Injury with Fall? 1 - - Yes -  Risk for fall due to :  History of fall(s);Impaired balance/gait Impaired balance/gait;Impaired mobility Impaired mobility - -  Follow up Falls prevention discussed - Falls prevention discussed;Education provided - -   Is the patient's home free of loose throw rugs in walkways, pet beds, electrical cords, etc?   yes      Grab  bars in the bathroom? yes      Handrails on the stairs?   yes      Adequate lighting?   yes   Depression Screen PHQ 2/9 Scores 07/26/2019 07/24/2018 07/14/2017 03/28/2016  PHQ - 2 Score 1 2 2  0  PHQ- 9 Score 5 8 5  -     Cognitive Function MMSE - Mini Mental State Exam 07/24/2018 07/14/2017 06/20/2015  Not completed: - - Unable to complete  Orientation to time 4 5 -  Orientation to Place 5 5 -  Registration 3 3 -  Attention/ Calculation 5 5 -  Recall 1 1 -  Language- name 2 objects 2 2 -  Language- repeat 1 1 -  Language- follow 3 step command 3 3 -  Language- read & follow direction 1 1 -  Write a sentence 1 1 -  Copy design 1 1 -  Total score 27 28 -        Ad8 score reviewed for issues:  Issues making decisions: no  Less interest in hobbies / activities: no  Repeats questions, stories (family complaining): no  Trouble using ordinary gadgets (microwave, computer, phone):no  Forgets the month or year: no  Mismanaging finances: no  Remembering appts: no  Daily problems with thinking and/or memory: no Ad8 score is= 0  Immunization History  Administered Date(s) Administered  . Hep A / Hep B 02/24/2018, 06/25/2018  . Hepatitis B, adult 03/26/2018  . Influenza Split 09/12/2011, 08/19/2012  . Influenza Whole 08/20/2007, 07/29/2008, 08/07/2009, 07/25/2010  . Influenza, High Dose Seasonal PF 07/09/2016, 07/14/2017, 07/24/2018  . Influenza,inj,Quad PF,6+ Mos 07/19/2013, 08/16/2014, 07/13/2015  . Influenza-Unspecified 07/15/2019  . Pneumococcal Conjugate-13 01/06/2015  . Pneumococcal Polysaccharide-23 07/19/2013  . Tdap 07/29/2011  . Zoster 10/28/2012   Screening Tests Health Maintenance  Topic Date Due  . MAMMOGRAM  05/09/2019  . TETANUS/TDAP  07/28/2021  . COLONOSCOPY  02/27/2025  . INFLUENZA VACCINE  Completed  . DEXA SCAN  Completed  . Hepatitis C Screening  Completed  . PNA vac Low Risk Adult  Completed      Plan:    Reviewed health maintenance  screenings with patient today and relevant education, vaccines, and/or referrals were provided.   I have personally reviewed and noted the following in the patient's chart:   . Medical and social history . Use of alcohol, tobacco or illicit drugs  . Current medications and supplements . Functional ability and status . Nutritional status . Physical activity . Advanced directives . List of other physicians . Vitals . Screenings to include cognitive, depression, and falls . Referrals and appointments  In addition, I have reviewed and discussed with patient certain preventive protocols, quality metrics, and best practice recommendations. A written personalized care plan for preventive services as well as general preventive health recommendations were provided to patient.     Michiel Cowboy, RN  07/26/2019   Medical screening examination/treatment/procedure(s) were performed by non-physician practitioner and as supervising physician I was immediately available for consultation/collaboration. I agree with above. Lew Dawes, MD

## 2019-07-26 ENCOUNTER — Ambulatory Visit (INDEPENDENT_AMBULATORY_CARE_PROVIDER_SITE_OTHER): Payer: Medicare Other | Admitting: *Deleted

## 2019-07-26 DIAGNOSIS — Z0000 Encounter for general adult medical examination without abnormal findings: Secondary | ICD-10-CM | POA: Diagnosis not present

## 2019-07-27 ENCOUNTER — Encounter: Payer: Self-pay | Admitting: Internal Medicine

## 2019-07-27 ENCOUNTER — Other Ambulatory Visit: Payer: Self-pay

## 2019-07-27 ENCOUNTER — Ambulatory Visit: Payer: Medicare Other | Admitting: Thoracic Surgery (Cardiothoracic Vascular Surgery)

## 2019-07-27 ENCOUNTER — Encounter: Payer: Self-pay | Admitting: Thoracic Surgery (Cardiothoracic Vascular Surgery)

## 2019-07-27 VITALS — BP 161/67 | HR 63 | Temp 97.3°F | Resp 18 | Ht 64.0 in | Wt 140.6 lb

## 2019-07-27 DIAGNOSIS — I77810 Thoracic aortic ectasia: Secondary | ICD-10-CM | POA: Diagnosis not present

## 2019-07-27 NOTE — Progress Notes (Signed)
[address]       ,[place] [address]             [phone]    HPI: [name] returns for a scheduled follow-up visit regarding her ascending aneurysm  Marissa [name] is a 73 year old woman with a history of hypertension, hyperlipidemia, mitral valve prolapse, bicuspid aortic valve with mild aortic stenosis, asthma, arthritis, ulcerative colitis, scoliosis, spinal stenosis, chronic pain, and a 4.3 cm ascending aneurysm.  I have been following her for an ascending aneurysm since 2012.  Most recently, I saw her in September 2019 and the aneurysm was 4.3 cm.  She saw Dr. Ellyn Hack in July.  He is trying her on a new medication for her cholesterol.  They have also made some changes to her blood pressure regimen.  She is on as needed clonidine but says she frequently forgets to take it with her, although she is been at home most of the time during the [place] situation. Past Medical History:  Diagnosis Date  . Arthritis   . Asthma   . Bicuspid aortic valve 05/2017   Likely functional bicuspid aortic valve with sclerosis and no stenosis.  . Bursitis   . Cataract    mild  . Dysthymia   . Hemorrhoids   . Hx of ulcerative colitis    per dr Arnoldo Morale as per pt.  . Hyperlipidemia   . Hypertension    managed - labile.  . Migraine   . Scoliosis   . 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 Spinal stenosis   . Thoracic aortic aneurysm (HCC)    Stable 4.2-4.3 cm (followed by Dr. Roxan Hockey)  . Thyroid disease     Current Outpatient Medications  Medication Sig Dispense Refill  . albuterol (PROAIR HFA) 108 (90 Base) MCG/ACT inhaler INHALE 2 PUFFS INTO THE LUNGS EVERY 6 (SIX) HOURS AS NEEDED. (Patient taking differently: Inhale 2 puffs into the lungs every 6 (six) hours as needed for shortness of breath. ) 8.5 each 2  . BYSTOLIC 10 MG tablet TAKE 1 TABLET(10  MG) BY MOUTH DAILY AT 8 PM 90 tablet 0  . carisoprodol (SOMA) 350 MG tablet TAKE 1 TABLET(350 MG) BY MOUTH THREE TIMES DAILY AS NEEDED 60 tablet 3  . cloNIDine (CATAPRES) 0.1 MG tablet Take 1 tablet (0.1 mg total) by mouth 2 (two) times daily as needed. 60 tablet 6  . cycloSPORINE (RESTASIS) 0.05 % ophthalmic emulsion Place 1 drop into both eyes 2 (two) times daily.    . Eszopiclone 3 MG TABS TAKE 1 TABLET BY MOUTH IMMEDIATELY BEFORE BEDTIME 30 tablet 5  . ezetimibe (ZETIA) 10 MG tablet Take 1 tablet (10 mg total) by mouth daily. 90 tablet 3  . famotidine (PEPCID) 20 MG tablet Take 20 mg by mouth as needed.     [name] Kitchen FLUoxetine (PROZAC) 40 MG capsule Take 1 capsule (40 mg total) by mouth 2 (two) times daily. 180 capsule 0  . levothyroxine (SYNTHROID) 125 MCG tablet TAKE 1 TABLET(125 MCG) BY MOUTH DAILY 90 tablet 1  . MINIVELLE 0.05 MG/24HR patch Place 1 patch onto the skin 2 (two) times a week.   3  . montelukast (SINGULAIR) 10 MG tablet TAKE 1 TABLET(10 MG) BY MOUTH AT BEDTIME 90 tablet 1  . Multiple Vitamin (MULTIVITAMIN WITH MINERALS) TABS tablet Take  1 tablet by mouth daily. 30 tablet 0  . MYRBETRIQ 50 MG TB24 tablet TAKE 1 TABLET(50 MG) BY MOUTH DAILY 90 tablet 1  . omeprazole (PRILOSEC) 20 MG capsule Take 20 mg by mouth daily as needed.    [name] Kitchen Respiratory Therapy Supplies (FLUTTER) DEVI Use as directed 1 each 0  . amLODipine (NORVASC) 10 MG tablet Take 1 tablet (10 mg total) by mouth daily. 30 tablet 4   No current facility-administered medications for this visit.     Physical Exam BP (!) 161/67 (BP Location: Left Arm, Patient Position: Sitting, Cuff Size: Normal)   Pulse 63   Temp (!) 97.3 F (36.3 C)   Resp 18   Ht 5' 4"  (1.626 m)   Wt 140 lb 9.6 oz (63.8 kg)   SpO2 99% Comment: RA  BMI 24.51 kg/m  73 year old woman in no acute distress Alert and oriented x3 with no focal deficits No carotid bruits Cardiac regular rate and rhythm with a 2/6 systolic murmur Lungs clear with equal  breath sounds bilaterally  Diagnostic Tests: MRA CHEST WITH OR WITHOUT CONTRAST  TECHNIQUE: Angiographic images of the chest were obtained using MRA technique with intravenous contrast.  CONTRAST:  10m MULTIHANCE GADOBENATE DIMEGLUMINE 529 MG/ML IV SOLN  COMPARISON:  07/07/2018 and previous  FINDINGS: VASCULAR  Aorta: Good contrast enhancement. No dissection or stenosis. Transverse dimensions as follows:  3.2 cm sinuses of Valsalva  2.8 cm sino-tubular junction  4.3 cm proximal ascending (previously 4.3)  3.3 cm distal ascending/proximal arch  2.6 cm distal arch  2.6 cm proximal descending  2.2 cm distal descending  Classic 3 vessel brachiocephalic arterial origin anatomy without proximal stenosis. No significant atheromatous irregularity. Visualized abdominal aorta unremarkable.  Heart: Normal size. No pericardial effusion.  Pulmonary Arteries: Normal caliber centrally. Limited evaluation of segmental and subsegmental branches.  Other: No acute findings. No pleural effusion.  NON-VASCULAR  Spinal cord: Negative limited evaluation  Brachial plexus: Limited evaluation  Muscles and tendons: No pathologic findings.  Bones: Negative limited evaluation  Joints: Limited evaluation  IMPRESSION: 1. Stable 4.3 cm ascending thoracic aortic aneurysm without complicating features. Recommend annual imaging followup by CTA or MRA. This recommendation follows 2010 ACCF/AHA/AATS/ACR/ASA/SCA/SCAI/SIR/STS/SVM Guidelines for the Diagnosis and Management of Patients with Thoracic Aortic Disease. Circulation. 2010; 121:: [id number]  Electronically Signed   By: DLucrezia EuropeM.D.   On: 07/15/2019 14:26 I personally reviewed the MR images and concur with the findings noted above  Impression: Marissa [name] a 73year old woman with a history of hypertension, hyperlipidemia, mitral valve prolapse, bicuspid aortic valve with mild aortic stenosis,  asthma, arthritis, ulcerative colitis, scoliosis, spinal stenosis, chronic pain, and a 4.3 cm ascending aneurysm.  Ascending aneurysm-stable at 4.3 cm.  Needs continued follow-up.  Normal aortic root.  Bicuspid aortic valve-mild stenosis.  Most recent echo indicated a tricuspid valve.  In either event needs continued follow-up.  Hypertension-blood pressure control remains difficult.  She is working with Dr. CSharlet Salinaand Dr. HEllyn Hackon that.  No medication changes on my part.  Plan: Follow-up with Dr. HEllyn Hackand Dr. CSharlet Salinaregarding blood pressure Return in 1 year with MR Angio of chest  SMelrose Nakayama MD [facility] [phone]

## 2019-07-28 ENCOUNTER — Encounter: Payer: Self-pay | Admitting: Internal Medicine

## 2019-08-02 DIAGNOSIS — H04123 Dry eye syndrome of bilateral lacrimal glands: Secondary | ICD-10-CM | POA: Diagnosis not present

## 2019-08-05 ENCOUNTER — Other Ambulatory Visit: Payer: Self-pay

## 2019-08-05 MED ORDER — AMLODIPINE BESYLATE 10 MG PO TABS: 10.0000 mg | ORAL_TABLET | Freq: Every day | ORAL | 4 refills | Status: DC

## 2019-08-19 ENCOUNTER — Other Ambulatory Visit: Payer: Self-pay

## 2019-08-19 MED ORDER — FLUOXETINE HCL 40 MG PO CAPS: 40.0000 mg | ORAL_CAPSULE | Freq: Two times a day (BID) | ORAL | 0 refills | Status: DC

## 2019-08-26 ENCOUNTER — Encounter: Payer: Self-pay | Admitting: Internal Medicine

## 2019-08-30 ENCOUNTER — Encounter: Payer: Self-pay | Admitting: Internal Medicine

## 2019-09-06 DIAGNOSIS — F32A Depression, unspecified: Secondary | ICD-10-CM | POA: Insufficient documentation

## 2019-09-06 DIAGNOSIS — I719 Aortic aneurysm of unspecified site, without rupture: Secondary | ICD-10-CM | POA: Insufficient documentation

## 2019-09-06 DIAGNOSIS — Z01419 Encounter for gynecological examination (general) (routine) without abnormal findings: Secondary | ICD-10-CM | POA: Diagnosis not present

## 2019-09-06 DIAGNOSIS — I341 Nonrheumatic mitral (valve) prolapse: Secondary | ICD-10-CM | POA: Insufficient documentation

## 2019-09-06 DIAGNOSIS — Z6824 Body mass index (BMI) 24.0-24.9, adult: Secondary | ICD-10-CM | POA: Diagnosis not present

## 2019-09-06 DIAGNOSIS — N3281 Overactive bladder: Secondary | ICD-10-CM | POA: Insufficient documentation

## 2019-09-06 DIAGNOSIS — Z1231 Encounter for screening mammogram for malignant neoplasm of breast: Secondary | ICD-10-CM | POA: Diagnosis not present

## 2019-09-09 ENCOUNTER — Other Ambulatory Visit: Payer: Self-pay | Admitting: Obstetrics and Gynecology

## 2019-09-09 DIAGNOSIS — R928 Other abnormal and inconclusive findings on diagnostic imaging of breast: Secondary | ICD-10-CM

## 2019-09-13 ENCOUNTER — Ambulatory Visit
Admission: RE | Admit: 2019-09-13 | Discharge: 2019-09-13 | Disposition: A | Discharge status: Home / Self Care | Payer: Medicare Other | Source: Ambulatory Visit | Attending: Obstetrics and Gynecology | Admitting: Obstetrics and Gynecology

## 2019-09-13 ENCOUNTER — Other Ambulatory Visit: Payer: Self-pay

## 2019-09-13 DIAGNOSIS — N6489 Other specified disorders of breast: Secondary | ICD-10-CM | POA: Diagnosis not present

## 2019-09-13 DIAGNOSIS — R928 Other abnormal and inconclusive findings on diagnostic imaging of breast: Secondary | ICD-10-CM | POA: Diagnosis not present

## 2019-09-22 ENCOUNTER — Other Ambulatory Visit: Payer: Self-pay | Admitting: Internal Medicine

## 2019-09-27 NOTE — Telephone Encounter (Signed)
Control database checked last refill: 11/11/2018 30 tabs LOV:04/29/2019 NOV: none

## 2019-09-30 DIAGNOSIS — I10 Essential (primary) hypertension: Secondary | ICD-10-CM | POA: Diagnosis not present

## 2019-09-30 DIAGNOSIS — E785 Hyperlipidemia, unspecified: Secondary | ICD-10-CM | POA: Diagnosis not present

## 2019-09-30 LAB — COMPREHENSIVE METABOLIC PANEL
ALT: 38 IU/L — ABNORMAL HIGH (ref 0–32)
AST: 36 IU/L (ref 0–40)
Albumin/Globulin Ratio: 2.3 — ABNORMAL HIGH (ref 1.2–2.2)
Albumin: 4.6 g/dL (ref 3.7–4.7)
Alkaline Phosphatase: 69 IU/L (ref 39–117)
BUN/Creatinine Ratio: 7 — ABNORMAL LOW (ref 12–28)
BUN: 5 mg/dL — ABNORMAL LOW (ref 8–27)
Bilirubin Total: 0.4 mg/dL (ref 0.0–1.2)
CO2: 22 mmol/L (ref 20–29)
Calcium: 9.1 mg/dL (ref 8.7–10.3)
Chloride: 96 mmol/L (ref 96–106)
Creatinine, Ser: 0.68 mg/dL (ref 0.57–1.00)
GFR calc Af Amer: 100 mL/min/{1.73_m2} (ref >59)
GFR calc non Af Amer: 86 mL/min/{1.73_m2} (ref >59)
Globulin, Total: 2 g/dL (ref 1.5–4.5)
Glucose: 94 mg/dL (ref 65–99)
Potassium: 4.2 mmol/L (ref 3.5–5.2)
Sodium: 133 mmol/L — ABNORMAL LOW (ref 134–144)
Total Protein: 6.6 g/dL (ref 6.0–8.5)

## 2019-09-30 LAB — LIPID PANEL
Chol/HDL Ratio: 2.4 ratio (ref 0.0–4.4)
Cholesterol, Total: 210 mg/dL — ABNORMAL HIGH (ref 100–199)
HDL: 89 mg/dL (ref >39)
LDL Chol Calc (NIH): 99 mg/dL (ref 0–99)
Triglycerides: 128 mg/dL (ref 0–149)
VLDL Cholesterol Cal: 22 mg/dL (ref 5–40)

## 2019-10-04 ENCOUNTER — Encounter: Payer: Self-pay | Admitting: Internal Medicine

## 2019-10-05 ENCOUNTER — Other Ambulatory Visit: Payer: Self-pay | Admitting: *Deleted

## 2019-10-05 DIAGNOSIS — E785 Hyperlipidemia, unspecified: Secondary | ICD-10-CM

## 2019-10-05 DIAGNOSIS — I10 Essential (primary) hypertension: Secondary | ICD-10-CM

## 2019-10-05 DIAGNOSIS — E871 Hypo-osmolality and hyponatremia: Secondary | ICD-10-CM

## 2019-10-05 NOTE — Progress Notes (Signed)
Notes recorded by Marissa Man, MD on 10/03/2019 at 11:34 PM EST  Chemistry function looks pretty normal. Normal kidney function. Sodium level is a little low, but for her stable.  Cholesterol level looks much better than 4 months ago. Total cholesterol down to 210 with LDL down to 99. HDL is relatively stable at roughly 90. Triglycerides are also better.   This is on Zetia alone. We will see if she stays stable with labs in 6 months.   Marissa Hew, MD    [facility] PLACED FOR LABS ( CMP ,LIPID) IN 6 MONTH - June 2021

## 2019-10-12 ENCOUNTER — Other Ambulatory Visit: Payer: Self-pay | Admitting: Internal Medicine

## 2019-10-15 ENCOUNTER — Encounter: Payer: Self-pay | Admitting: Internal Medicine

## 2019-10-15 ENCOUNTER — Ambulatory Visit (INDEPENDENT_AMBULATORY_CARE_PROVIDER_SITE_OTHER): Payer: Medicare Other | Admitting: Internal Medicine

## 2019-10-15 DIAGNOSIS — Z20828 Contact with and (suspected) exposure to other viral communicable diseases: Secondary | ICD-10-CM

## 2019-10-15 DIAGNOSIS — Z20822 Contact with and (suspected) exposure to covid-19: Secondary | ICD-10-CM | POA: Insufficient documentation

## 2019-10-15 MED ORDER — AZITHROMYCIN 250 MG PO TABS: ORAL_TABLET | ORAL | 0 refills | Status: DC

## 2019-10-15 NOTE — Progress Notes (Signed)
Virtual Visit via Audio Note  I connected with Oval Linsey Percifield on 10/15/19 at 11:20 AM EST by a video enabled telemedicine application and verified that I am speaking with the correct person using two identifiers.  The patient and the provider were at separate locations throughout the entire encounter.   I discussed the limitations of evaluation and management by telemedicine and the availability of in person appointments. The patient expressed understanding and agreed to proceed. The patient and the provider were the only parties present for the visit unless noted in HPI below.  History of Present Illness: The patient is a 73 y.o. female with visit for covid symptoms. Started Sunday with sore throat and congestion. Has fevers and chills and cough and sore throat and congestion. Had cleaners in her home last week but did not think she was exposed. Denies known exposure to covid-19. Does have some SOB and using inhaler which helps for some time. Overall it is worsening throughout the week. Has tried albuterol inhaler with some relief temporarily.   Observations/Objective: Voice strong, no dyspnea on phone, some coughing intermittent, A and O times 3  Assessment and Plan: See problem oriented charting  Follow Up Instructions: covid-19 testing, z-pack to cover pneumonia given allergies, ER if SOB worsens  Visit time 12 minutes: that time was spent in non-face to face counseling and coordination of care with the patient: counseled about as above  I discussed the assessment and treatment plan with the patient. The patient was provided an opportunity to ask questions and all were answered. The patient agreed with the plan and demonstrated an understanding of the instructions.   The patient was advised to call back or seek an in-person evaluation if the symptoms worsen or if the condition fails to improve as anticipated.  [name], MD

## 2019-10-15 NOTE — Assessment & Plan Note (Signed)
Covid-19 testing ordered and will ask pool to work her in today. Advised to isolate. Rx azithromycin to cover for pneumonia given she has had this before and allergies.

## 2019-10-18 ENCOUNTER — Encounter: Payer: Self-pay | Admitting: Internal Medicine

## 2019-10-18 NOTE — Telephone Encounter (Signed)
Pt and her husband has been informed and expressed understanding. Stated the patient has a covid test scheduled for tomorrow but will got to urgent care tonight if breathing worsens.

## 2019-10-19 ENCOUNTER — Ambulatory Visit: Payer: Medicare Other | Attending: Internal Medicine

## 2019-10-19 DIAGNOSIS — Z20828 Contact with and (suspected) exposure to other viral communicable diseases: Secondary | ICD-10-CM | POA: Diagnosis not present

## 2019-10-19 DIAGNOSIS — Z20822 Contact with and (suspected) exposure to covid-19: Secondary | ICD-10-CM

## 2019-10-20 ENCOUNTER — Other Ambulatory Visit: Payer: Self-pay | Admitting: Cardiology

## 2019-10-21 LAB — NOVEL CORONAVIRUS, NAA: SARS-CoV-2, NAA: NOT DETECTED

## 2019-10-22 ENCOUNTER — Other Ambulatory Visit: Payer: Self-pay

## 2019-10-22 ENCOUNTER — Emergency Department (HOSPITAL_BASED_OUTPATIENT_CLINIC_OR_DEPARTMENT_OTHER)
Admission: EM | Admit: 2019-10-22 | Discharge: 2019-10-22 | Disposition: A | Discharge status: Home / Self Care | Payer: Medicare Other | Attending: Emergency Medicine | Admitting: Emergency Medicine

## 2019-10-22 ENCOUNTER — Emergency Department (HOSPITAL_BASED_OUTPATIENT_CLINIC_OR_DEPARTMENT_OTHER): Payer: Medicare Other

## 2019-10-22 ENCOUNTER — Encounter (HOSPITAL_BASED_OUTPATIENT_CLINIC_OR_DEPARTMENT_OTHER): Payer: Self-pay | Admitting: *Deleted

## 2019-10-22 DIAGNOSIS — J45909 Unspecified asthma, uncomplicated: Secondary | ICD-10-CM | POA: Insufficient documentation

## 2019-10-22 DIAGNOSIS — S0990XA Unspecified injury of head, initial encounter: Secondary | ICD-10-CM | POA: Diagnosis present

## 2019-10-22 DIAGNOSIS — W1839XA Other fall on same level, initial encounter: Secondary | ICD-10-CM | POA: Insufficient documentation

## 2019-10-22 DIAGNOSIS — Y999 Unspecified external cause status: Secondary | ICD-10-CM | POA: Insufficient documentation

## 2019-10-22 DIAGNOSIS — E039 Hypothyroidism, unspecified: Secondary | ICD-10-CM | POA: Insufficient documentation

## 2019-10-22 DIAGNOSIS — Y92009 Unspecified place in unspecified non-institutional (private) residence as the place of occurrence of the external cause: Secondary | ICD-10-CM | POA: Diagnosis not present

## 2019-10-22 DIAGNOSIS — I10 Essential (primary) hypertension: Secondary | ICD-10-CM | POA: Diagnosis not present

## 2019-10-22 DIAGNOSIS — S0003XA Contusion of scalp, initial encounter: Secondary | ICD-10-CM | POA: Diagnosis not present

## 2019-10-22 DIAGNOSIS — S199XXA Unspecified injury of neck, initial encounter: Secondary | ICD-10-CM | POA: Diagnosis not present

## 2019-10-22 DIAGNOSIS — Y9389 Activity, other specified: Secondary | ICD-10-CM | POA: Diagnosis not present

## 2019-10-22 DIAGNOSIS — S42031A Displaced fracture of lateral end of right clavicle, initial encounter for closed fracture: Secondary | ICD-10-CM | POA: Insufficient documentation

## 2019-10-22 MED ORDER — HYDROMORPHONE HCL 1 MG/ML IJ SOLN
1.0000 mg | Freq: Once | INTRAMUSCULAR | Status: AC
  Administered 2019-10-22: 1 mg via INTRAMUSCULAR
  Filled 2019-10-22: qty 1

## 2019-10-22 NOTE — ED Provider Notes (Signed)
Emergency Department Provider Note   I have reviewed the triage vital signs and the nursing notes.   HISTORY  Chief Complaint Fall   HPI Marissa [name] is a 73 y.o. female presents to the emergency department 1 day after mechanical fall at home.  Patient states that she was bending down to turn up her Christmas tree when she fell over landing primarily on her right side.  She has had bruising with pain and swelling in the right shoulder which is persisted and worsened over the past 24 hours.  She also sustained a head injury and has some swelling with mild bruising to the right scalp.  She did not lose consciousness during the fall.  She has not developed somnolence, confusion, vomiting.  She is not experiencing weakness, numbness, tingling in the extremities.  She does not take blood thinner.  Pain in the shoulder is worse with movement or touching the area.  Pain is moderate to severe at times.   Past Medical History:  Diagnosis Date  . Arthritis   . Asthma   . Bicuspid aortic valve 05/2017   Likely functional bicuspid aortic valve with sclerosis and no stenosis.  . Bursitis   . Cataract    mild  . Dysthymia   . Hemorrhoids   . Hx of ulcerative colitis    per dr Arnoldo Morale as per pt.  . Hyperlipidemia   . Hypertension    managed - labile.  . Migraine   . Scoliosis   . 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 Spinal stenosis   . Thoracic aortic aneurysm (HCC)    Stable 4.2-4.3 cm (followed by Dr. Roxan Hockey)  . Thyroid disease     Patient Active Problem List   Diagnosis Date Noted  . Suspected COVID-19 virus infection 10/15/2019  . Malnutrition ([place]) 12/22/2018  . Hypokalemia 12/22/2018  . Hypomagnesemia 12/22/2018  . Hypophosphatemia 12/22/2018  . Alcohol abuse 12/22/2018  . Anxiety   . Prolonged QT interval 12/21/2018  . Hyponatremia  12/21/2018  . Chest pain 12/21/2018  . Hyperlipidemia with target LDL less than 100 10/02/2018  . Encounter for chronic pain management 11/28/2017  . Dyspnea on exertion 06/01/2017  . Mild aortic stenosis 05/27/2017  . Encounter for routine adult health examination with abnormal findings 07/09/2016  . Recurrent falls 03/28/2016  . Rosacea 08/18/2015  . Lumbar radiculopathy 07/07/2015  . Urinary incontinence 06/20/2015  . Hemorrhoids 10/06/2014  . Cough variant asthma vs UACS 08/03/2014  . Labile hypertension 02/22/2014  . Insomnia secondary to chronic pain 07/09/2012  . B12 deficiency 04/03/2011  . UNSPECIFIED VITAMIN D DEFICIENCY 07/25/2010  . ANEMIA, IRON DEFICIENCY 04/25/2009  . Allergic rhinitis 02/01/2009  . Mild dilation of ascending aorta (HCC) 12/27/2008  . Chronic fatigue syndrome 04/21/2008  . 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 OSTEOARTHRITIS 01/19/2008  . Hypothyroidism 03/13/2007  . Asthma 03/13/2007  . Fibromyalgia 03/13/2007    Past Surgical History:  Procedure Laterality Date  . ABDOMINAL HYSTERECTOMY    . BREAST EXCISIONAL BIOPSY Left   . BUNIONECTOMY    . Cardiac Event Monitor  07/2017   Overall relatively normal.  Normal sinus rhythm with rare bradycardia and tachycardia.  Heart rate ranged from 55-110 bpm.  Occasional PACs and PVCs, every single 1 was felt.  No arrhythmias other than one short  4 beat run of PACs.  . COLONOSCOPY  02-04-2005   all normal   . FOOT SURGERY     3 pins in toes   . HAND SURGERY     left thumb joint resection  . nasal revision    . NM MYOVIEW LTD  07/2017   LOW RISK study.  No ischemia or infarction.  EF greater than 65%.   [name] Kitchen SINUS IRRIGATION    . TONSILLECTOMY AND ADENOIDECTOMY    . TRANSTHORACIC ECHOCARDIOGRAM  06/'1, 8/'19   a) Moderate LVH. Normal EF 60-65%. Normal diastolic parameters. --> Difficult to fully visualize the aortic valve. Cannot exclude bicuspid valve. Mild aortic stenosis noted. No  PFO. Mildly dilated left atrium. Trivial MR. No comment on mitral valve prolapse. Moderately dilated ascending aorta.;; b) F/u Echo To evaluate the aortic valve. -- Bicuspid AoV - mildly thickened / calcified. - No stenosis  . TRANSTHORACIC ECHOCARDIOGRAM  11/2018   Normal LV size and function.  EF 60-65%.  GR 1 DD.  Normal RV size and function.  Moderate aortic valve calcification with mild Aortic Stenosis.  Normal LV size and function.  EF 60-65%.  GR 1 DD.  Normal RV size and function.  Moderate aortic calcification with mild stenosis.  Mild dilation of ascending aorta ~4.2 mm    Allergies Fentanyl, Rosuvastatin, Amoxicillin, Hctz [hydrochlorothiazide], Hydralazine, Codeine, Conjugated estrogens, Erythromycin, and Piroxicam  Family History  Problem Relation Age of Onset  . Arthritis Mother   . Leukemia Father   . Heart disease Maternal Uncle   . Diabetes Paternal Aunt   . Breast cancer Maternal Grandmother   . Heart disease Maternal Grandfather   . Breast cancer Paternal Grandmother   . Colon cancer Neg Hx     Social History Social History   Tobacco Use  . Smoking status: Never Smoker  . Smokeless tobacco: Never Used  Substance Use Topics  . Alcohol use: Yes    Alcohol/week: 0.0 standard drinks    Comment: occ   . Drug use: No    Review of Systems  Constitutional: No fever/chills Eyes: No visual changes. ENT: No sore throat. Cardiovascular: Denies chest pain. Respiratory: Denies shortness of breath. Gastrointestinal: No abdominal pain.  No nausea, no vomiting.  No diarrhea.  No constipation. Genitourinary: Negative for dysuria. Musculoskeletal: Negative for back pain. Positive right shoulder pain and bruising.  Skin: Negative for rash. Neurological: Negative for focal weakness or numbness. Positive HA.   10-point ROS otherwise negative.  ____________________________________________   PHYSICAL EXAM:  VITAL SIGNS: ED Triage Vitals  Enc Vitals Group     BP  10/22/19 2010 (!) 152/75     Pulse Rate 10/22/19 2010 73     Resp 10/22/19 2010 18     Temp 10/22/19 2010 98.5 F (36.9 C)     Temp Source 10/22/19 2010 Oral     SpO2 10/22/19 2010 100 %     Weight 10/22/19 2011 140 lb (63.5 kg)     Height 10/22/19 2011 5' 4"  (1.626 m)   Constitutional: Alert and oriented. Well appearing and in no acute distress. Eyes: Conjunctivae are normal. PERRL. EOMI. Head: Bruising with mild hematoma, without laceration, over the right frontal scalp. Nose: No congestion/rhinnorhea. Mouth/Throat: Mucous membranes are moist.  Neck: No stridor. No cervical spine tenderness to palpation. Cardiovascular: Normal rate, regular rhythm. Good peripheral circulation. Grossly normal heart sounds.   Respiratory: Normal respiratory effort.  No retractions. Lungs CTAB. Gastrointestinal:  No distention.  Musculoskeletal: No lower  extremity tenderness nor edema. No gross deformities of extremities.  Pain with attempted range of motion of the right shoulder.  No tenderness over the right elbow, wrist.  Neurologic:  Normal speech and language. No gross focal neurologic deficits are appreciated.  Skin:  Skin is warm and dry.  Bruising of the lateral clavicle with swelling.  No skin tenting or evidence of pressure necrosis.    ____________________________________________  [id number]  DG Shoulder Right  Result Date: 10/22/2019 CLINICAL DATA:  Fall.  Deformity. EXAM: RIGHT SHOULDER - 2+ VIEW COMPARISON:  None. FINDINGS: No fracture dislocation the distal clavicle and AC joint. A fragment of the inferior clavicle remains place, likely attached to the coracoclavicular ligament. Distal clavicle is fractured and displaced superiorly. Glenohumeral joint is intact. IMPRESSION: 1. Comminuted distal clavicle fracture. 2. Grade 3 or 4 acromioclavicular joint injury with significant widening of the coracoclavicular ligament and posterior displacement. Electronically Signed   By: San Morelle M.D.   On: 10/22/2019 21:02   CT Head Wo Contrast  Result Date: 10/22/2019 CLINICAL DATA:  Head trauma, minor. Fall yesterday. Right scalp hematoma. EXAM: CT HEAD WITHOUT CONTRAST CT CERVICAL SPINE WITHOUT CONTRAST TECHNIQUE: Multidetector CT imaging of the head and cervical spine was performed following the standard protocol without intravenous contrast. Multiplanar CT image reconstructions of the cervical spine were also generated. COMPARISON:  CT head without contrast 12/21/2018. MRI of the cervical spine 08/17/2014 FINDINGS: CT HEAD FINDINGS Brain: Mild atrophy and white matter disease is stable. No acute infarct, hemorrhage, or mass lesion is present. The ventricles are of normal size. No significant extraaxial fluid collection is present. The brainstem and cerebellum are within normal limits. Vascular: Atherosclerotic changes are present in the cavernous internal carotid arteries by bilaterally. There is no hyperdense vessel. Skull: A right frontotemporal scalp hematoma is present. There is no underlying fracture. No radiopaque foreign body is present. Calvarium is intact. Sinuses/Orbits: The paranasal sinuses and mastoid air cells are clear. Bilateral lens replacements are noted. Globes and orbits are otherwise unremarkable. CT CERVICAL SPINE FINDINGS Alignment: Slight degenerative anterolisthesis is again noted at C3-4. No other significant listhesis is present. There is straightening of the normal cervical lordosis. Skull base and vertebrae: Craniocervical junction is normal. Vertebral body heights are maintained. No acute or healing fractures are present. Soft tissues and spinal canal: No prevertebral fluid or swelling. No visible canal hematoma. Disc levels: Asymmetric right-sided facet degenerative changes and spurring are present at C3-4 and C4-5. Asymmetric left-sided uncovertebral spurring is evident at C5-6 and C6-7. Bilateral facet hypertrophy is present at C7-T1 without significant  stenosis. Upper chest: Patchy ground-glass attenuation is present at the lung apices bilaterally. Thoracic inlet is normal. IMPRESSION: 1. Right frontotemporal scalp hematoma without underlying fracture. 2. Stable mild atrophy and white matter disease. 3. No acute intracranial abnormality or significant interval change. 4. Multilevel degenerative changes of the cervical spine without acute fracture or traumatic subluxation. 5. Ground-glass attenuation of the lung apices bilaterally. While this may represent atelectasis, edema or infection is also considered. Recommend chest radiographs for further evaluation. Electronically Signed   By: San Morelle M.D.   On: 10/22/2019 21:14   CT Cervical Spine Wo Contrast  Result Date: 10/22/2019 CLINICAL DATA:  Head trauma, minor. Fall yesterday. Right scalp hematoma. EXAM: CT HEAD WITHOUT CONTRAST CT CERVICAL SPINE WITHOUT CONTRAST TECHNIQUE: Multidetector CT imaging of the head and cervical spine was performed following the standard protocol without intravenous contrast. Multiplanar CT image reconstructions of the cervical spine were  also generated. COMPARISON:  CT head without contrast 12/21/2018. MRI of the cervical spine 08/17/2014 FINDINGS: CT HEAD FINDINGS Brain: Mild atrophy and white matter disease is stable. No acute infarct, hemorrhage, or mass lesion is present. The ventricles are of normal size. No significant extraaxial fluid collection is present. The brainstem and cerebellum are within normal limits. Vascular: Atherosclerotic changes are present in the cavernous internal carotid arteries by bilaterally. There is no hyperdense vessel. Skull: A right frontotemporal scalp hematoma is present. There is no underlying fracture. No radiopaque foreign body is present. Calvarium is intact. Sinuses/Orbits: The paranasal sinuses and mastoid air cells are clear. Bilateral lens replacements are noted. Globes and orbits are otherwise unremarkable. CT CERVICAL  SPINE FINDINGS Alignment: Slight degenerative anterolisthesis is again noted at C3-4. No other significant listhesis is present. There is straightening of the normal cervical lordosis. Skull base and vertebrae: Craniocervical junction is normal. Vertebral body heights are maintained. No acute or healing fractures are present. Soft tissues and spinal canal: No prevertebral fluid or swelling. No visible canal hematoma. Disc levels: Asymmetric right-sided facet degenerative changes and spurring are present at C3-4 and C4-5. Asymmetric left-sided uncovertebral spurring is evident at C5-6 and C6-7. Bilateral facet hypertrophy is present at C7-T1 without significant stenosis. Upper chest: Patchy ground-glass attenuation is present at the lung apices bilaterally. Thoracic inlet is normal. IMPRESSION: 1. Right frontotemporal scalp hematoma without underlying fracture. 2. Stable mild atrophy and white matter disease. 3. No acute intracranial abnormality or significant interval change. 4. Multilevel degenerative changes of the cervical spine without acute fracture or traumatic subluxation. 5. Ground-glass attenuation of the lung apices bilaterally. While this may represent atelectasis, edema or infection is also considered. Recommend chest radiographs for further evaluation. Electronically Signed   By: San Morelle M.D.   On: 10/22/2019 21:14    ____________________________________________   PROCEDURES  Procedure(s) performed:   Procedures  None ____________________________________________   INITIAL IMPRESSION / ASSESSMENT AND PLAN / ED COURSE  Pertinent labs & imaging results that were available during my care of the patient were reviewed by me and considered in my medical decision making (see chart for details).   Patient presents to the emergency department 1 day after mechanical fall at home.  Plain film of the right shoulder shows lateral clavicle fracture which is comminuted and likely AC  separation.  No skin tenting, necrosis, neurovascular compromise in the extremity.  She was placed in a sling immobilizer.  Patient has Soma at home which she takes for chronic pain and will continue to do so.  The CT of her head and cervical spine was reviewed with no acute findings.  Provided contact information for orthopedic surgery and encouraged her to call on Monday to schedule an appointment in the coming week.  Given the fracture morphology and AC separation this may require operative repair but nothing to be done emergently.  Discussed imaging and follow-up plan with patient and husband by phone.   CT imaging of the cervical spine does show findings in the lung.  Patient is not having any respiratory or pneumonia symptoms.  She did have a mild URI type illness last week and was tested for COVID-19 with PCR results coming back negative yesterday.  In this setting, will not repeat test today as the patient is not having symptoms.  No antibiotics indicated at this time.    ____________________________________________  FINAL CLINICAL IMPRESSION(S) / ED DIAGNOSES  Final diagnoses:  Closed displaced fracture of acromial end of right clavicle,  initial encounter  Contusion of scalp, initial encounter  Injury of head, initial encounter     MEDICATIONS GIVEN DURING THIS VISIT:  Medications  HYDROmorphone (DILAUDID) injection 1 mg (1 mg Intramuscular Given 10/22/19 2042)     Note:  This document was prepared using Dragon voice recognition software and may include unintentional dictation errors.  Nanda Quinton, MD, [facility] Emergency Medicine    Jaion Lagrange, [facility], MD 10/23/19 [phone]

## 2019-10-22 NOTE — Discharge Instructions (Signed)
You were seen in the emergency department today after fall.  You have a fracture of your clavicle.  You will need to see the orthopedic surgeon.  Please keep the sling in place and call the orthopedic office on Monday.  You can take your home pain medications.  Return to the emergency department any new or suddenly worsening symptoms.

## 2019-10-22 NOTE — ED Notes (Signed)
Pt. Placed in sling and clothed by RN and Leveda Anna EMT.  Pt. Tolerated well.

## 2019-10-22 NOTE — ED Notes (Signed)
RN spoke to Pt. Husband about Pt. In lobby and asked Dr. Laverta Baltimore to call him.

## 2019-10-22 NOTE — ED Triage Notes (Signed)
ll yesterday while bending down to turn off christmas tree.  Deformity with bruising and swelling to right shoulder. Bruising to right forehead, denies LOC.

## 2019-10-24 ENCOUNTER — Encounter: Payer: Self-pay | Admitting: Internal Medicine

## 2019-10-25 DIAGNOSIS — S42001A Fracture of unspecified part of right clavicle, initial encounter for closed fracture: Secondary | ICD-10-CM | POA: Insufficient documentation

## 2019-10-25 DIAGNOSIS — S42031A Displaced fracture of lateral end of right clavicle, initial encounter for closed fracture: Secondary | ICD-10-CM | POA: Diagnosis not present

## 2019-10-25 DIAGNOSIS — M25511 Pain in right shoulder: Secondary | ICD-10-CM | POA: Diagnosis not present

## 2019-11-22 ENCOUNTER — Telehealth: Payer: Self-pay | Admitting: Internal Medicine

## 2019-11-22 MED ORDER — ESZOPICLONE 3 MG PO TABS: ORAL_TABLET | ORAL | 5 refills | Status: DC

## 2019-11-22 NOTE — Telephone Encounter (Signed)
        1. Which medications need to be refilled? (please list name of each medication and dose if known) Eszopiclone 3 MG TABS  2. Which pharmacy/location (including street and city if local pharmacy) is medication to be sent to?[id number] - [place], [place] - [address] AT [address]  3. Do they need a 30 day or 90 day supply? [place]

## 2019-11-23 ENCOUNTER — Encounter: Payer: Self-pay | Admitting: Internal Medicine

## 2019-11-23 ENCOUNTER — Ambulatory Visit: Payer: Medicare Other | Admitting: Cardiology

## 2019-11-23 MED ORDER — ESZOPICLONE 3 MG PO TABS: ORAL_TABLET | ORAL | 5 refills | Status: DC

## 2019-11-24 DIAGNOSIS — S42031D Displaced fracture of lateral end of right clavicle, subsequent encounter for fracture with routine healing: Secondary | ICD-10-CM | POA: Diagnosis not present

## 2019-12-10 ENCOUNTER — Other Ambulatory Visit: Payer: Self-pay | Admitting: Internal Medicine

## 2019-12-22 DIAGNOSIS — S42031D Displaced fracture of lateral end of right clavicle, subsequent encounter for fracture with routine healing: Secondary | ICD-10-CM | POA: Diagnosis not present

## 2019-12-28 DIAGNOSIS — Z961 Presence of intraocular lens: Secondary | ICD-10-CM | POA: Diagnosis not present

## 2019-12-28 DIAGNOSIS — H04123 Dry eye syndrome of bilateral lacrimal glands: Secondary | ICD-10-CM | POA: Diagnosis not present

## 2019-12-28 DIAGNOSIS — H40013 Open angle with borderline findings, low risk, bilateral: Secondary | ICD-10-CM | POA: Diagnosis not present

## 2020-01-04 ENCOUNTER — Other Ambulatory Visit: Payer: Self-pay | Admitting: Cardiology

## 2020-01-07 ENCOUNTER — Other Ambulatory Visit: Payer: Self-pay

## 2020-01-07 ENCOUNTER — Ambulatory Visit: Payer: Medicare Other | Admitting: Cardiology

## 2020-01-07 ENCOUNTER — Encounter: Payer: Self-pay | Admitting: Cardiology

## 2020-01-07 VITALS — BP 144/78 | HR 54 | Temp 94.8°F | Ht 64.0 in | Wt 136.0 lb

## 2020-01-07 DIAGNOSIS — R0989 Other specified symptoms and signs involving the circulatory and respiratory systems: Secondary | ICD-10-CM | POA: Diagnosis not present

## 2020-01-07 DIAGNOSIS — R0609 Other forms of dyspnea: Secondary | ICD-10-CM

## 2020-01-07 DIAGNOSIS — E785 Hyperlipidemia, unspecified: Secondary | ICD-10-CM | POA: Diagnosis not present

## 2020-01-07 DIAGNOSIS — I77810 Thoracic aortic ectasia: Secondary | ICD-10-CM

## 2020-01-07 DIAGNOSIS — I350 Nonrheumatic aortic (valve) stenosis: Secondary | ICD-10-CM | POA: Diagnosis not present

## 2020-01-07 DIAGNOSIS — R0600 Dyspnea, unspecified: Secondary | ICD-10-CM

## 2020-01-07 MED ORDER — EZETIMIBE 10 MG PO TABS: 10.0000 mg | ORAL_TABLET | Freq: Every day | ORAL | 3 refills | Status: DC

## 2020-01-07 MED ORDER — NEBIVOLOL HCL 10 MG PO TABS: ORAL_TABLET | ORAL | 3 refills | Status: DC

## 2020-01-07 NOTE — Patient Instructions (Addendum)
Medication Instructions:   ZETIA , BYSTOLIC AND AMLODIPINE HAVE BEEN REFILLED    *If you need a refill on your cardiac medications before your next appointment, please call your pharmacy*   Lab Work: CBC,LIPID TSH, CMP   Testing/Procedures: NOT NEEDED   Follow-Up: At [facility], you and your health needs are our priority.  As part of our continuing mission to provide you with exceptional heart care, we have created designated Provider Care Teams.  These Care Teams include your primary Cardiologist (physician) and Advanced Practice Providers (APPs -  Physician Assistants and Nurse Practitioners) who all work together to provide you with the care you need, when you need it.    Your next appointment:   12 month(s)  The format for your next appointment:   In Person  Provider:   [name], MD   Other Instructions N/A

## 2020-01-07 NOTE — Progress Notes (Signed)
Primary Care Provider: [name], MD Cardiologist: [name], MD Electrophysiologist: None  Clinic Note: Chief Complaint  Patient presents with  . Follow-up    8 months.  . Chest Pain    Right-sided after fall  . Headache    HPI:    Zarya Lasseigne is a 74 y.o. female with a PMH below who presents today for 6-5-monthfollow-up with labile hypertension, palpitations, mild ascending aortic dilation and mild aortic stenosis.  She was previously diagnosed with bicuspid aortic valve and mitral prolapse.  Neither of these have been borne out on echo.  She has been intolerant of any diuretics including thiazides, loop diuretics and spironolactone. She has hyperlipidemia, but has been intolerant of any statin.  We have considered PCSK9 inhibitor.  -> She was concerned about side effects of her ARB, was switched to amlodipine along with her Bystolic.  Is  JSharlyn Odonnelwas last seen on May 24, 2019 -> was overall doing well.  Blood pressures been doing well.  She never actually did start taking Nexletol.  I do not think she ended up going to [facility].  She noted off-and-on dizziness and headaches as well as joint pain and myalgias.  Recent Hospitalizations:   Fall on Christmas Day 2020 with closed displaced fracture of the right clavicle-plan conservative management.  Reviewed  CV studies:    The following studies were reviewed today: (if available, images/films reviewed: From Epic Chart or Care Everywhere) . None:   Interval History:   JOval LinseyRankin is here today for routine follow-up indicating that she is doing fairly well.  She never did start taking any of the lipid medicines. She says her blood pressures have been pretty well controlled and has been the most concerning thing thing for her from when I met her.  She says today's reading is pretty high but she did not yet take her blood pressure medicines.  In fact, she has been out of amlodipine for about 2 days  now.  She notes a lot less of the lightheaded and dizzy symptoms and has been really almost cut out all of her alcohol level.  She is not sure what happened in December when she fell but she thinks he lost her balance it was not a syncopal episode.  Minimal palpitations noted on Bystolic.  She is having some discomfort in the chest and arm from her clavicle fracture, but no real chest pain per se.  Some mild pain with deep inspiration but no exertional chest pain.  CV Review of Symptoms (Summary): no chest pain or dyspnea on exertion negative for - edema, irregular heartbeat, orthopnea, palpitations, paroxysmal nocturnal dyspnea, rapid heart rate, shortness of breath or Syncope/near syncope no TIA/amaurosis fugax, claudication.  The patient does not have symptoms concerning for COVID-19 infection (fever, chills, cough, or new shortness of breath).  The patient is practicing social distancing & Masking.    REVIEWED OF SYSTEMS   Review of Systems  Constitutional: Negative for malaise/fatigue and weight loss.  HENT: Negative for congestion and nosebleeds.   Gastrointestinal: Negative for blood in stool and melena.  Genitourinary: Negative for hematuria.  Musculoskeletal: Positive for falls and joint pain (Right shoulder).       Per HPI  Neurological: Positive for headaches. Negative for dizziness, focal weakness and weakness.  Psychiatric/Behavioral: Negative for memory loss. The patient is not nervous/anxious.    I have reviewed and (if needed) personally updated the patient's problem list,  medications, allergies, past medical and surgical history, social and family history.   PAST MEDICAL HISTORY   Past Medical History:  Diagnosis Date  . Arthritis   . Asthma   . Bicuspid aortic valve 05/2017   Likely functional bicuspid aortic valve with sclerosis and no stenosis.  . Bursitis   . Cataract    mild  . Dysthymia   . Hemorrhoids   . Hx of ulcerative colitis    per dr Arnoldo Morale as  per pt.  . Hyperlipidemia   . Hypertension    managed - labile.  . Migraine   . Scoliosis   . 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 Spinal stenosis   . Thoracic aortic aneurysm (HCC)    Stable 4.2-4.3 cm (followed by Dr. Roxan Hockey)  . Thyroid disease     PAST SURGICAL HISTORY   Past Surgical History:  Procedure Laterality Date  . ABDOMINAL HYSTERECTOMY    . BREAST EXCISIONAL BIOPSY Left   . BUNIONECTOMY    . Cardiac Event Monitor  07/2017   Overall relatively normal.  Normal sinus rhythm with rare bradycardia and tachycardia.  Heart rate ranged from 55-110 bpm.  Occasional PACs and PVCs, every single 1 was felt.  No arrhythmias other than one short 4 beat run of PACs.  . COLONOSCOPY  02-04-2005   all normal   . FOOT SURGERY     3 pins in toes   . HAND SURGERY     left thumb joint resection  . nasal revision    . NM MYOVIEW LTD  07/2017   LOW RISK study.  No ischemia or infarction.  EF greater than 65%.   [name] Kitchen SINUS IRRIGATION    . TONSILLECTOMY AND ADENOIDECTOMY    . TRANSTHORACIC ECHOCARDIOGRAM  06/'1, 8/'19   a) Moderate LVH. Normal EF 60-65%. Normal diastolic parameters. --> Difficult to fully visualize the aortic valve. Cannot exclude bicuspid valve. Mild aortic stenosis noted. No PFO. Mildly dilated left atrium. Trivial MR. No comment on mitral valve prolapse. Moderately dilated ascending aorta.;; b) F/u Echo To evaluate the aortic valve. -- Bicuspid AoV - mildly thickened / calcified. - No stenosis  . TRANSTHORACIC ECHOCARDIOGRAM  11/2018   Normal LV size and function.  EF 60-65%.  GR 1 DD.  Normal RV size and function.  Moderate aortic valve calcification with mild Aortic Stenosis.  Normal LV size and function.  EF 60-65%.  GR 1 DD.  Normal RV size and function.  Moderate aortic calcification with mild stenosis.  Mild dilation of ascending aorta ~4.2 mm     MEDICATIONS/ALLERGIES   Current Meds  Medication Sig  . albuterol (PROAIR HFA) 108 (90 Base) MCG/ACT inhaler INHALE 2 PUFFS INTO THE LUNGS EVERY 6 (SIX) HOURS AS NEEDED. (Patient taking differently: Inhale 2 puffs into the lungs every 6 (six) hours as needed for shortness of breath. )  . ALPRAZolam (XANAX) 0.25 MG tablet TAKE 1 TABLET(0.25 MG) BY MOUTH DAILY AS NEEDED FOR ANXIETY  . amLODipine (NORVASC) 10 MG tablet Take 1 tablet (10 mg total) by mouth daily.  . carisoprodol (SOMA) 350 MG tablet TAKE 1 TABLET(350 MG) BY MOUTH THREE TIMES DAILY AS NEEDED  . cloNIDine (CATAPRES) 0.1 MG tablet Take 1 tablet (0.1 mg total) by mouth 2 (two) times daily as needed.  . Eszopiclone 3 MG TABS TAKE 1 TABLET  BY MOUTH IMMEDIATELY BEFORE BEDTIME  . famotidine (PEPCID) 20 MG tablet Take 20 mg by mouth as needed.   . feeding supplement, GLUCERNA SHAKE, (GLUCERNA SHAKE) LIQD Take 237 mLs by mouth daily.  [name] Kitchen FLUoxetine (PROZAC) 40 MG capsule Take 1 capsule (40 mg total) by mouth 2 (two) times daily. (Patient taking differently: Take 40 mg by mouth daily. )  . levothyroxine (SYNTHROID) 125 MCG tablet TAKE 1 TABLET(125 MCG) BY MOUTH DAILY  . MINIVELLE 0.05 MG/24HR patch Place 1 patch onto the skin 2 (two) times a week.   . nebivolol (BYSTOLIC) 10 MG tablet TAKE 1 TABLET(10 MG) BY MOUTH DAILY AT 8 PM  . omeprazole (PRILOSEC) 20 MG capsule Take 20 mg by mouth daily as needed.  [name] Kitchen Respiratory Therapy Supplies (FLUTTER) DEVI Use as directed  . [DISCONTINUED] azithromycin (ZITHROMAX) 250 MG tablet Day 1 take 2 pills, days 2-5 take 1 pill daily.  . [DISCONTINUED] BYSTOLIC 10 MG tablet TAKE 1 TABLET(10 MG) BY MOUTH DAILY AT 8 PM  . [DISCONTINUED] cycloSPORINE (RESTASIS) 0.05 % ophthalmic emulsion Place 1 drop into both eyes 2 (two) times daily.  . [DISCONTINUED] montelukast (SINGULAIR) 10 MG tablet TAKE 1 TABLET(10 MG) BY MOUTH AT BEDTIME  . [DISCONTINUED] Multiple Vitamin (MULTIVITAMIN WITH MINERALS) TABS tablet Take  1 tablet by mouth daily.  . [DISCONTINUED] MYRBETRIQ 50 MG TB24 tablet TAKE 1 TABLET(50 MG) BY MOUTH DAILY    Allergies  Allergen Reactions  . Fentanyl Shortness Of Breath and Rash  . Rosuvastatin Anaphylaxis, Anxiety, Hives, Itching, Nausea And Vomiting, Other (See Comments), Palpitations, Photosensitivity and Shortness Of Breath  . Amoxicillin Diarrhea    Severe diarrhea, rash to vaginal area with swelling   . Hctz [Hydrochlorothiazide] Other (See Comments)    HypoNatremia  . Hydralazine     Weak, sweats, red skin  . Codeine Other (See Comments)    makes her hyper  . Conjugated Estrogens Itching and Rash  . Erythromycin Rash    had a rash with emycin, has done ok with other meds in it's class  . Piroxicam Itching and Rash    Feldene    SOCIAL HISTORY/FAMILY HISTORY   Reviewed in Epic:  Pertinent findings: - changed diet - trying to eat more healthy.    Still recovering from her fall & fractured R collar bone.   OBJCTIVE -PE, EKG, labs   Wt Readings from Last 3 Encounters:  01/07/20 136 lb (61.7 kg)  10/22/19 140 lb (63.5 kg)  07/27/19 140 lb 9.6 oz (63.8 kg)    Physical Exam: BP (!) 144/78 (BP Location: Left Arm, Patient Position: Sitting, Cuff Size: Normal)   Pulse (!) 54   Temp (!) 94.8 F (34.9 C)   Ht 5' 4"  (1.626 m)   Wt 136 lb (61.7 kg)   BMI 23.34 kg/m  - This blood pressure readings actually usually unusually high for her.  Usually SBP is in the 110s to 120 mmHg range. Physical Exam  Constitutional: She is oriented to person, place, and time. She appears well-developed and well-nourished. No distress.  Well-groomed, healthy-appearing.  HENT:  Head: Normocephalic and atraumatic.  Neck: No hepatojugular reflux and no JVD present. Carotid bruit is not present (Radiated aortic murmur).  Cardiovascular: Regular rhythm, S1 normal, S2 normal and intact distal pulses.  No extrasystoles are present. Bradycardia present. PMI is not displaced. Exam reveals no  gallop, no friction rub and no midsystolic click.  Murmur (No HSM) heard.  Harsh crescendo-decrescendo midsystolic murmur is present with  a grade of 2/6 at the upper right sternal border radiating to the neck. Pulmonary/Chest: Effort normal and breath sounds normal. No respiratory distress. She has no wheezes. She has no rales.  Musculoskeletal:        General: No edema. Normal range of motion.  Neurological: She is alert and oriented to person, place, and time.  Psychiatric: She has a normal mood and affect. Her behavior is normal. Judgment and thought content normal.  Vitals reviewed.    Adult ECG Report  Rate: 54 ;  Rhythm: sinus bradycardia and Cannot exclude anteroseptal MI, age undetermined.  Otherwise normal axis, intervals and durations.;   Narrative Interpretation: Essentially normal EKG.  Recent Labs:  Lab Results  Component Value Date   CHOL 210 (H) 09/30/2019   HDL 89 09/30/2019   LDLCALC 99 09/30/2019   LDLDIRECT 145.7 09/01/2013   TRIG 128 09/30/2019   CHOLHDL 2.4 09/30/2019   Lab Results  Component Value Date   CREATININE 0.68 09/30/2019   BUN 5 (L) 09/30/2019   NA 133 (L) 09/30/2019   K 4.2 09/30/2019   CL 96 09/30/2019   CO2 22 09/30/2019   Lab Results  Component Value Date   TSH 0.813 12/22/2018    ASSESSMENT/PLAN    Problem List Items Addressed This Visit    Dyspnea on exertion (Chronic)    No longer on verapamil.  Doing better with Bystolic alone.      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 Roxan Hockey.  Is been stable. Continue blood pressure control.  Seems to be doing well.  Need to refill amlodipine and Bystolic.  Also continue to try to adjust lipid management.  She seems to tolerating Zetia but nothing else.      Relevant Medications   ezetimibe (ZETIA) 10 MG tablet   nebivolol (BYSTOLIC) 10 MG tablet   Other Relevant Orders   EKG 12-Lead (Completed)   Lipid panel   Comprehensive metabolic  panel   CBC   TSH   Labile hypertension - Primary (Chronic)    Somewhat labile blood pressures.  However lately been very well controlled with amlodipine and Bystolic.  She has not had to use any as needed clonidine. Continue current meds.      Relevant Medications   ezetimibe (ZETIA) 10 MG tablet   nebivolol (BYSTOLIC) 10 MG tablet   Other Relevant Orders   EKG 12-Lead (Completed)   Lipid panel   Comprehensive metabolic panel   CBC   TSH   Mild aortic stenosis (Chronic)    Most recent echo suggested actually moderate sclerosis but no real stenosis.  We can follow-up an echo in 3 years from her most recent one in 2020.      Relevant Medications   ezetimibe (ZETIA) 10 MG tablet   nebivolol (BYSTOLIC) 10 MG tablet   Other Relevant Orders   EKG 12-Lead (Completed)   Lipid panel   Comprehensive metabolic panel   CBC   TSH   Hyperlipidemia with target LDL less than 100 (Chronic)    She felt really strange taking Nexletol.  Still has edema PCSK9 habitus.  Not ready talk about yet.  For now simply continue Zetia.  She clearly has a genetic component as she certainly does have a decent diet and is relatively active.      Relevant Medications   ezetimibe (ZETIA) 10 MG tablet   nebivolol (BYSTOLIC) 10 MG tablet   Other Relevant Orders  Lipid panel   Comprehensive metabolic panel   CBC   TSH       COVID-19 Education: The signs and symptoms of COVID-19 were discussed with the patient and how to seek care for testing (follow up with PCP or arrange E-visit).   The importance of social distancing was discussed today.  I spent a total of 18 minutes with the patient. >  50% of the time was spent in direct patient consultation.  Additional time spent with chart review  / charting (studies, outside notes, etc): 8 Total Time: 26 min   Current medicines are reviewed at length with the patient today.  (+/- concerns) has been out of amlodipine.   Patient Instructions / Medication  Changes & Studies & Tests Ordered   Patient Instructions  Medication Instructions:   ZETIA , BYSTOLIC AND AMLODIPINE HAVE BEEN REFILLED    *If you need a refill on your cardiac medications before your next appointment, please call your pharmacy*   Lab Work: CBC,LIPID TSH, CMP   Testing/Procedures: NOT NEEDED   Follow-Up: At [facility], you and your health needs are our priority.  As part of our continuing mission to provide you with exceptional heart care, we have created designated Provider Care Teams.  These Care Teams include your primary Cardiologist (physician) and Advanced Practice Providers (APPs -  Physician Assistants and Nurse Practitioners) who all work together to provide you with the care you need, when you need it.    Your next appointment:   12 month(s)  The format for your next appointment:   In Person  Provider:   [name], MD   Other Instructions N/A    Studies Ordered:   Orders Placed This Encounter  Procedures  . Lipid panel  . Comprehensive metabolic panel  . CBC  . TSH  . EKG 12-Lead     [name], M.D., M.S. Interventional Cardiologist   Pager # [phone] Phone # [phone] [address]. [place], [place] [address]   Thank you for choosing Heartcare at [facility]!!

## 2020-01-09 ENCOUNTER — Other Ambulatory Visit: Payer: Self-pay | Admitting: Cardiology

## 2020-01-09 ENCOUNTER — Other Ambulatory Visit: Payer: Self-pay | Admitting: Internal Medicine

## 2020-01-11 ENCOUNTER — Encounter: Payer: Self-pay | Admitting: Cardiology

## 2020-01-11 NOTE — Assessment & Plan Note (Signed)
Somewhat labile blood pressures.  However lately been very well controlled with amlodipine and Bystolic.  She has not had to use any as needed clonidine. Continue current meds.

## 2020-01-11 NOTE — Assessment & Plan Note (Signed)
She felt really strange taking Nexletol.  Still has edema PCSK9 habitus.  Not ready talk about yet.  For now simply continue Zetia.  She clearly has a genetic component as she certainly does have a decent diet and is relatively active.

## 2020-01-11 NOTE — Assessment & Plan Note (Addendum)
Most recent echo suggested actually moderate sclerosis but no real stenosis.  We can follow-up an echo in 3 years from her most recent one in 2020.

## 2020-01-11 NOTE — Assessment & Plan Note (Signed)
No longer on verapamil.  Doing better with Bystolic alone.

## 2020-01-11 NOTE — Assessment & Plan Note (Signed)
This is being followed by Dr. Roxan Hockey.  Is been stable. Continue blood pressure control.  Seems to be doing well.  Need to refill amlodipine and Bystolic.  Also continue to try to adjust lipid management.  She seems to tolerating Zetia but nothing else.

## 2020-01-12 DIAGNOSIS — R0989 Other specified symptoms and signs involving the circulatory and respiratory systems: Secondary | ICD-10-CM | POA: Diagnosis not present

## 2020-01-12 DIAGNOSIS — I77810 Thoracic aortic ectasia: Secondary | ICD-10-CM | POA: Diagnosis not present

## 2020-01-12 DIAGNOSIS — I350 Nonrheumatic aortic (valve) stenosis: Secondary | ICD-10-CM | POA: Diagnosis not present

## 2020-01-12 DIAGNOSIS — E785 Hyperlipidemia, unspecified: Secondary | ICD-10-CM | POA: Diagnosis not present

## 2020-01-12 LAB — COMPREHENSIVE METABOLIC PANEL
ALT: 28 IU/L (ref 0–32)
AST: 33 IU/L (ref 0–40)
Albumin/Globulin Ratio: 1.9 (ref 1.2–2.2)
Albumin: 4.6 g/dL (ref 3.7–4.7)
Alkaline Phosphatase: 71 IU/L (ref 39–117)
BUN/Creatinine Ratio: 10 — ABNORMAL LOW (ref 12–28)
BUN: 7 mg/dL — ABNORMAL LOW (ref 8–27)
Bilirubin Total: 0.5 mg/dL (ref 0.0–1.2)
CO2: 24 mmol/L (ref 20–29)
Calcium: 9.6 mg/dL (ref 8.7–10.3)
Chloride: 95 mmol/L — ABNORMAL LOW (ref 96–106)
Creatinine, Ser: 0.73 mg/dL (ref 0.57–1.00)
GFR calc Af Amer: 93 mL/min/{1.73_m2} (ref >59)
GFR calc non Af Amer: 81 mL/min/{1.73_m2} (ref >59)
Globulin, Total: 2.4 g/dL (ref 1.5–4.5)
Glucose: 88 mg/dL (ref 65–99)
Potassium: 4.2 mmol/L (ref 3.5–5.2)
Sodium: 135 mmol/L (ref 134–144)
Total Protein: 7 g/dL (ref 6.0–8.5)

## 2020-01-12 LAB — LIPID PANEL
Chol/HDL Ratio: 2.2 ratio (ref 0.0–4.4)
Cholesterol, Total: 219 mg/dL — ABNORMAL HIGH (ref 100–199)
HDL: 99 mg/dL (ref >39)
LDL Chol Calc (NIH): 100 mg/dL — ABNORMAL HIGH (ref 0–99)
Triglycerides: 118 mg/dL (ref 0–149)
VLDL Cholesterol Cal: 20 mg/dL (ref 5–40)

## 2020-01-12 LAB — CBC
Hematocrit: 35.7 % (ref 34.0–46.6)
Hemoglobin: 12.6 g/dL (ref 11.1–15.9)
MCH: 32.3 pg (ref 26.6–33.0)
MCHC: 35.3 g/dL (ref 31.5–35.7)
MCV: 92 fL (ref 79–97)
Platelets: 267 10*3/uL (ref 150–450)
RBC: 3.9 x10E6/uL (ref 3.77–5.28)
RDW: 13 % (ref 11.7–15.4)
WBC: 4.3 10*3/uL (ref 3.4–10.8)

## 2020-01-12 LAB — TSH: TSH: 0.265 u[IU]/mL — ABNORMAL LOW (ref 0.450–4.500)

## 2020-02-02 DIAGNOSIS — S42031D Displaced fracture of lateral end of right clavicle, subsequent encounter for fracture with routine healing: Secondary | ICD-10-CM | POA: Diagnosis not present

## 2020-02-15 ENCOUNTER — Encounter: Payer: Self-pay | Admitting: Internal Medicine

## 2020-02-15 MED ORDER — CARISOPRODOL 350 MG PO TABS: 350.0000 mg | ORAL_TABLET | Freq: Three times a day (TID) | ORAL | 3 refills | Status: DC | PRN

## 2020-02-18 ENCOUNTER — Telehealth: Payer: Self-pay

## 2020-02-21 ENCOUNTER — Encounter: Payer: Self-pay | Admitting: Internal Medicine

## 2020-02-21 ENCOUNTER — Other Ambulatory Visit: Payer: Self-pay

## 2020-02-21 ENCOUNTER — Ambulatory Visit (INDEPENDENT_AMBULATORY_CARE_PROVIDER_SITE_OTHER): Payer: Medicare Other | Admitting: Internal Medicine

## 2020-02-21 DIAGNOSIS — F419 Anxiety disorder, unspecified: Secondary | ICD-10-CM | POA: Diagnosis not present

## 2020-02-21 MED ORDER — ESZOPICLONE 3 MG PO TABS: ORAL_TABLET | ORAL | 1 refills | Status: DC

## 2020-02-21 MED ORDER — ARIPIPRAZOLE 2 MG PO TABS: 2.0000 mg | ORAL_TABLET | Freq: Every day | ORAL | 6 refills | Status: DC

## 2020-02-21 NOTE — Progress Notes (Signed)
   Subjective:   Patient ID: Marissa [name], female    DOB: [birth year], 74 y.o.   MRN: [id number]  HPI The patient is a 74 YO female coming in for concerns about depression. She is not coping well since breaking right shoulder in December. This caused a lot of pain and lack of mobility and inability to do ADLs. This has impacted her QOL since that time. She is not back to normal yet. Still having a lot of mental fog and she is no longer handling the bills at home. This is causing tension with her spouse as she is still needing assistance with things. Denies SI/HI. Taking prozac for many years and prior to that had tried several different agents. She cannot recall names.   Review of Systems  Constitutional: Positive for activity change and fatigue.  HENT: Negative.   Eyes: Negative.   Respiratory: Negative for cough, chest tightness and shortness of breath.   Cardiovascular: Negative for chest pain, palpitations and leg swelling.  Gastrointestinal: Negative for abdominal distention, abdominal pain, constipation, diarrhea, nausea and vomiting.  Musculoskeletal: Negative.   Skin: Negative.   Neurological: Negative.   Psychiatric/Behavioral: Positive for confusion, decreased concentration, dysphoric mood and sleep disturbance. The patient is nervous/anxious.     Objective:  Physical Exam Constitutional:      Appearance: She is well-developed.  HENT:     Head: Normocephalic and atraumatic.  Cardiovascular:     Rate and Rhythm: Normal rate and regular rhythm.  Pulmonary:     Effort: Pulmonary effort is normal. No respiratory distress.     Breath sounds: Normal breath sounds. No wheezing or rales.  Abdominal:     General: Bowel sounds are normal. There is no distension.     Palpations: Abdomen is soft.     Tenderness: There is no abdominal tenderness. There is no rebound.  Musculoskeletal:     Cervical back: Normal range of motion.  Skin:    General: Skin is warm and dry.    Neurological:     Mental Status: She is alert and oriented to person, place, and time.     Coordination: Coordination normal.  Psychiatric:     Comments: Mood appropriate     Vitals:   02/21/20 1041  BP: (!) 144/82  Pulse: 62  Temp: 98.1 F (36.7 C)  SpO2: 99%  Weight: 132 lb 9.6 oz (60.1 kg)  Height: 5' 4"  (1.626 m)    This visit occurred during the SARS-CoV-2 public health emergency.  Safety protocols were in place, including screening questions prior to the visit, additional usage of staff PPE, and extensive cleaning of exam room while observing appropriate contact time as indicated for disinfecting solutions.   Assessment & Plan:  Visit time 25 minutes in face to face communication with patient and coordination of care, additional 5 minutes spent in record review, coordination or care, ordering tests, communicating/referring to other healthcare professionals, documenting in medical records all on the same day of the visit for total time 30 minutes spent on the visit.

## 2020-02-21 NOTE — Assessment & Plan Note (Signed)
Continue prozac and add abilify for additional benefit.

## 2020-02-21 NOTE — Patient Instructions (Addendum)
We do not need blood work today.   We have sent in the lunesta for a 3 month supply so you do not have to go to pharmacy every month.   We have sent in abilify to add on to prozac 1 pill daily to help more.

## 2020-03-06 ENCOUNTER — Encounter: Payer: Self-pay | Admitting: Internal Medicine

## 2020-03-09 ENCOUNTER — Telehealth: Payer: Self-pay

## 2020-03-09 ENCOUNTER — Telehealth: Payer: Self-pay | Admitting: Internal Medicine

## 2020-03-09 NOTE — Telephone Encounter (Signed)
New Message:   Swati from [employer] is calling and states she needs prior auth for ARIPiprazole (ABILIFY) 2 MG tablet for the pt. Please advise.

## 2020-03-09 NOTE — Telephone Encounter (Signed)
New message    [employer] calling   Urgent request for prior authorization ARIPiprazole (ABILIFY) 2 MG tablet.

## 2020-03-09 NOTE — Telephone Encounter (Signed)
Prior authorization is currently being completed received paperwork

## 2020-03-10 NOTE — Telephone Encounter (Signed)
Currently completing form that was sent to [place] by [facility] for prior authorization

## 2020-03-13 ENCOUNTER — Encounter: Payer: Self-pay | Admitting: Internal Medicine

## 2020-03-14 ENCOUNTER — Encounter: Payer: Self-pay | Admitting: Internal Medicine

## 2020-03-20 ENCOUNTER — Other Ambulatory Visit: Payer: Self-pay | Admitting: Internal Medicine

## 2020-03-28 DIAGNOSIS — H04123 Dry eye syndrome of bilateral lacrimal glands: Secondary | ICD-10-CM | POA: Diagnosis not present

## 2020-03-30 ENCOUNTER — Encounter: Payer: Self-pay | Admitting: Internal Medicine

## 2020-03-30 MED ORDER — FLUOXETINE HCL 20 MG PO TABS: 60.0000 mg | ORAL_TABLET | Freq: Every day | ORAL | 3 refills | Status: DC

## 2020-03-31 ENCOUNTER — Telehealth: Payer: Self-pay | Admitting: Internal Medicine

## 2020-03-31 NOTE — Telephone Encounter (Signed)
Watt Climes Nurse Practitioner  with [place] called and was reporting an increase in depression and she was wondering if she could increase the patients Prozac to 65m or if Dr. CSharlet Salinawanted to do it.

## 2020-03-31 NOTE — Telephone Encounter (Signed)
This has already been handled directly with patient via mychart.

## 2020-04-13 DIAGNOSIS — L719 Rosacea, unspecified: Secondary | ICD-10-CM | POA: Diagnosis not present

## 2020-04-13 DIAGNOSIS — L304 Erythema intertrigo: Secondary | ICD-10-CM | POA: Diagnosis not present

## 2020-04-24 ENCOUNTER — Other Ambulatory Visit: Payer: Self-pay | Admitting: Internal Medicine

## 2020-04-27 DIAGNOSIS — Z0120 Encounter for dental examination and cleaning without abnormal findings: Secondary | ICD-10-CM | POA: Diagnosis not present

## 2020-05-09 ENCOUNTER — Telehealth: Payer: Self-pay

## 2020-05-09 NOTE — Telephone Encounter (Signed)
New message    [employer] Approval on medication  carisoprodol (SOMA) 350 MG tablet   Approval good for one year - patient/pharmacy is aware

## 2020-05-10 DIAGNOSIS — H04123 Dry eye syndrome of bilateral lacrimal glands: Secondary | ICD-10-CM | POA: Diagnosis not present

## 2020-05-11 DIAGNOSIS — L304 Erythema intertrigo: Secondary | ICD-10-CM | POA: Diagnosis not present

## 2020-05-16 ENCOUNTER — Encounter: Payer: Self-pay | Admitting: Internal Medicine

## 2020-05-17 ENCOUNTER — Other Ambulatory Visit: Payer: Self-pay | Admitting: Internal Medicine

## 2020-05-17 DIAGNOSIS — J4520 Mild intermittent asthma, uncomplicated: Secondary | ICD-10-CM

## 2020-05-17 MED ORDER — LEVALBUTEROL TARTRATE 45 MCG/ACT IN AERO: 1.0000 | INHALATION_SPRAY | Freq: Three times a day (TID) | RESPIRATORY_TRACT | 2 refills | Status: DC | PRN

## 2020-05-28 ENCOUNTER — Other Ambulatory Visit: Payer: Self-pay | Admitting: Internal Medicine

## 2020-05-29 NOTE — Telephone Encounter (Signed)
03/16/2020 Alprazolam 0.25 Mg Tablet 30#  Last ov 02/21/20 Next ov n/s

## 2020-06-05 ENCOUNTER — Telehealth: Payer: Self-pay

## 2020-06-05 ENCOUNTER — Encounter: Payer: Self-pay | Admitting: Internal Medicine

## 2020-06-05 NOTE — Telephone Encounter (Signed)
New message    1.Medication Requested:FLUoxetine (PROZAC) 20 MG tablet  2. Pharmacy (Name, Street, City):[facility] [phone] - [place], [address] AT [address]  3. On Med List: Yes   4. Last Visit with PCP: 4.26.21   5. Next visit date with PCP: N/A   Agent: Please be advised that RX refills may take up to 3 business days. We ask that you follow-up with your pharmacy.

## 2020-06-18 ENCOUNTER — Telehealth: Payer: Self-pay | Admitting: Internal Medicine

## 2020-06-21 ENCOUNTER — Other Ambulatory Visit: Payer: Self-pay | Admitting: *Deleted

## 2020-06-21 DIAGNOSIS — I712 Thoracic aortic aneurysm, without rupture, unspecified: Secondary | ICD-10-CM

## 2020-06-21 NOTE — Telephone Encounter (Signed)
New message   1.Medication Requested:Eszopiclone 3 MG TABS -90 day supply  Need prior authorization on medication.   2. The cost $ 300.00 the patient has pay well into her coverage gap stage - Patient is not able to afford the price of medication    Asking for a call back from the [place]

## 2020-06-22 NOTE — Telephone Encounter (Signed)
Key: [id number]

## 2020-06-22 NOTE — Telephone Encounter (Signed)
F/u    The patient is asking for a call back from the [place] to discuss her medication refill

## 2020-06-23 ENCOUNTER — Other Ambulatory Visit: Payer: Self-pay | Admitting: Internal Medicine

## 2020-06-23 NOTE — Telephone Encounter (Signed)
  Patient is showing medication approved through [facility], however pharmacy is still refusing to fill  [place], [address] AT [facility] [address] Phone:  [phone]  Fax:  [phone]     Please contact the patient with a status update, she would like to have the meds before the weekend if possible.

## 2020-06-23 NOTE — Telephone Encounter (Signed)
Pharmacist stated the medication is in processing and they will contact the pt when it is ready for pick up.   Pt has been informed.

## 2020-06-23 NOTE — Telephone Encounter (Signed)
Per [employer] PA is approved until 06/22/2021/  Determination sent to scan.

## 2020-06-25 ENCOUNTER — Encounter: Payer: Self-pay | Admitting: Internal Medicine

## 2020-06-30 DIAGNOSIS — L821 Other seborrheic keratosis: Secondary | ICD-10-CM | POA: Diagnosis not present

## 2020-06-30 DIAGNOSIS — L304 Erythema intertrigo: Secondary | ICD-10-CM | POA: Diagnosis not present

## 2020-07-04 ENCOUNTER — Telehealth: Payer: Self-pay | Admitting: Internal Medicine

## 2020-07-04 NOTE — Telephone Encounter (Signed)
Handicap placard was dropped off and placed in providers box.

## 2020-07-06 ENCOUNTER — Telehealth: Payer: Self-pay

## 2020-07-06 NOTE — Telephone Encounter (Signed)
Pt is currently having similar symptoms that she had last year that turned out to be hyponatremia; dizziness, imbalance issues, weakness.  Dr Zigmund Daniel will have labs drawn & send results to PCP.  Requesting pt be called for appt with PCP to discuss neurology referral as MD is concerned about her ability to remember/recall things.  [facility] test results 23/30.  Appt made with Dr Jenny Reichmann as requested by Dr Zigmund Daniel.

## 2020-07-11 ENCOUNTER — Other Ambulatory Visit: Payer: Self-pay | Admitting: Cardiology

## 2020-07-12 ENCOUNTER — Other Ambulatory Visit: Payer: Self-pay

## 2020-07-12 ENCOUNTER — Ambulatory Visit
Admission: RE | Admit: 2020-07-12 | Discharge: 2020-07-12 | Disposition: A | Discharge status: Home / Self Care | Payer: Medicare Other | Source: Ambulatory Visit | Attending: Thoracic Surgery (Cardiothoracic Vascular Surgery) | Admitting: Thoracic Surgery (Cardiothoracic Vascular Surgery)

## 2020-07-12 DIAGNOSIS — I712 Thoracic aortic aneurysm, without rupture, unspecified: Secondary | ICD-10-CM

## 2020-07-12 DIAGNOSIS — R531 Weakness: Secondary | ICD-10-CM | POA: Diagnosis not present

## 2020-07-12 MED ORDER — GADOBENATE DIMEGLUMINE 529 MG/ML IV SOLN
12.0000 mL | Freq: Once | INTRAVENOUS | Status: AC | PRN
  Administered 2020-07-12: 12 mL via INTRAVENOUS

## 2020-07-14 ENCOUNTER — Other Ambulatory Visit: Payer: Self-pay

## 2020-07-14 ENCOUNTER — Encounter: Payer: Self-pay | Admitting: Internal Medicine

## 2020-07-14 ENCOUNTER — Ambulatory Visit (INDEPENDENT_AMBULATORY_CARE_PROVIDER_SITE_OTHER): Payer: Medicare Other | Admitting: Internal Medicine

## 2020-07-14 VITALS — BP 150/86 | HR 55 | Temp 98.8°F | Ht 64.0 in | Wt 137.0 lb

## 2020-07-14 DIAGNOSIS — R413 Other amnesia: Secondary | ICD-10-CM | POA: Diagnosis not present

## 2020-07-14 DIAGNOSIS — R0989 Other specified symptoms and signs involving the circulatory and respiratory systems: Secondary | ICD-10-CM

## 2020-07-14 DIAGNOSIS — F419 Anxiety disorder, unspecified: Secondary | ICD-10-CM

## 2020-07-14 DIAGNOSIS — Z23 Encounter for immunization: Secondary | ICD-10-CM

## 2020-07-14 NOTE — Patient Instructions (Signed)
Well be on the lookout for the sodium test you mentioned  You had the flu shot today  Please continue all other medications as before, and refills have been done if requested.  Please have the pharmacy call with any other refills you may need.  Please continue your efforts at being more active, low cholesterol diet, and weight control.  Please keep your appointments with your specialists as you may have planned  You will be contacted regarding the referral for: Brain MRI, and Neurology

## 2020-07-14 NOTE — Progress Notes (Signed)
Subjective:    Patient ID: Marissa [name], female    DOB: 1946/04/03, 74 y.o.   MRN: [id number]  HPI  Here to f/u with 3- mo worsening memory difficulty ST that she is not sure is related to worsening anxiety or depression.  Pt denies new neurological symptoms such as new headache, or facial or extremity weakness or numbness  Pt denies chest pain, increased sob or doe, wheezing, orthopnea, PND, increased LE swelling, palpitations, dizziness or syncope.   Pt denies polydipsia, polyuria.  BP at home < 140/90 so she is not overly concerned.  Did have a mild low Na level yesterday with GYN per pt and labs are being forwarded.  Also had some scarring in the lungs noted on recent MRI Past Medical History:  Diagnosis Date  . Arthritis   . Asthma   . Bicuspid aortic valve 05/2017   Likely functional bicuspid aortic valve with sclerosis and no stenosis.  . Bursitis   . Cataract    mild  . Dysthymia   . Hemorrhoids   . Hx of ulcerative colitis    per dr Arnoldo Morale as per pt.  . Hyperlipidemia   . Hypertension    managed - labile.  . Migraine   . Scoliosis   . 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 Spinal stenosis   . Thoracic aortic aneurysm (HCC)    Stable 4.2-4.3 cm (followed by Dr. Roxan Hockey)  . Thyroid disease    Past Surgical History:  Procedure Laterality Date  . ABDOMINAL HYSTERECTOMY    . BREAST EXCISIONAL BIOPSY Left   . BUNIONECTOMY    . Cardiac Event Monitor  07/2017   Overall relatively normal.  Normal sinus rhythm with rare bradycardia and tachycardia.  Heart rate ranged from 55-110 bpm.  Occasional PACs and PVCs, every single 1 was felt.  No arrhythmias other than one short 4 beat run of PACs.  . COLONOSCOPY  02-04-2005   all normal   . FOOT SURGERY     3 pins in toes   . HAND SURGERY     left thumb joint resection  . nasal revision    . NM MYOVIEW  LTD  07/2017   LOW RISK study.  No ischemia or infarction.  EF greater than 65%.   [name] Kitchen SINUS IRRIGATION    . TONSILLECTOMY AND ADENOIDECTOMY    . TRANSTHORACIC ECHOCARDIOGRAM  06/'1, 8/'19   a) Moderate LVH. Normal EF 60-65%. Normal diastolic parameters. --> Difficult to fully visualize the aortic valve. Cannot exclude bicuspid valve. Mild aortic stenosis noted. No PFO. Mildly dilated left atrium. Trivial MR. No comment on mitral valve prolapse. Moderately dilated ascending aorta.;; b) F/u Echo To evaluate the aortic valve. -- Bicuspid AoV - mildly thickened / calcified. - No stenosis  . TRANSTHORACIC ECHOCARDIOGRAM  11/2018   Normal LV size and function.  EF 60-65%.  GR 1 DD.  Normal RV size and function.  Moderate aortic valve calcification with mild Aortic Stenosis.  Normal LV size and function.  EF 60-65%.  GR 1 DD.  Normal RV size and function.  Moderate aortic calcification with mild stenosis.  Mild dilation of ascending aorta ~4.2 mm    reports that she has never smoked. She has never used smokeless tobacco. She reports current alcohol use. She reports that she does not use drugs.  family history includes Arthritis in her mother; Breast cancer in her maternal grandmother and paternal grandmother; Diabetes in her paternal aunt; Heart disease in her maternal grandfather and maternal uncle; Leukemia in her father. Allergies  Allergen Reactions  . Fentanyl Shortness Of Breath and Rash  . Rosuvastatin Anaphylaxis, Anxiety, Hives, Itching, Nausea And Vomiting, Other (See Comments), Palpitations, Photosensitivity and Shortness Of Breath  . Amoxicillin Diarrhea    Severe diarrhea, rash to vaginal area with swelling   . Hctz [Hydrochlorothiazide] Other (See Comments)    HypoNatremia  . Hydralazine     Weak, sweats, red skin  . Codeine Other (See Comments)    makes her hyper  . Conjugated Estrogens Itching and Rash  . Erythromycin Rash    had a rash with emycin, has done ok with other meds in  it's class  . Piroxicam Itching and Rash    Feldene   Current Outpatient Medications on File Prior to Visit  Medication Sig Dispense Refill  . ALPRAZolam (XANAX) 0.25 MG tablet TAKE 1 TABLET(0.25 MG) BY MOUTH DAILY AS NEEDED FOR ANXIETY 30 tablet 4  . amLODipine (NORVASC) 10 MG tablet Take 1 tablet (10 mg total) by mouth daily. 90 tablet 3  . carisoprodol (SOMA) 350 MG tablet Take 1 tablet (350 mg total) by mouth 3 (three) times daily as needed for muscle spasms. 60 tablet 3  . cloNIDine (CATAPRES) 0.1 MG tablet Take 1 tablet (0.1 mg total) by mouth 2 (two) times daily as needed. 60 tablet 6  . Eszopiclone 3 MG TABS TAKE 1 TABLET BY MOUTH IMMEDIATELY BEFORE BEDTIME 90 tablet 1  . famotidine (PEPCID) 20 MG tablet Take 20 mg by mouth as needed.     . feeding supplement, GLUCERNA SHAKE, (GLUCERNA SHAKE) LIQD Take 237 mLs by mouth daily.    [name] Kitchen FLUoxetine (PROZAC) 20 MG tablet Take 3 tablets (60 mg total) by mouth daily. 90 tablet 3  . levalbuterol (XOPENEX HFA) 45 MCG/ACT inhaler Inhale 1-2 puffs into the lungs every 8 (eight) hours as needed for wheezing. 1 Inhaler 2  . levothyroxine (SYNTHROID) 125 MCG tablet TAKE 1 TABLET(125 MCG) BY MOUTH DAILY 90 tablet 1  . MINIVELLE 0.05 MG/24HR patch Place 1 patch onto the skin 2 (two) times a week.   3  . montelukast (SINGULAIR) 10 MG tablet TAKE 1 TABLET(10 MG) BY MOUTH AT BEDTIME 90 tablet 1  . MYRBETRIQ 50 MG TB24 tablet TAKE 1 TABLET(50 MG) BY MOUTH DAILY 90 tablet 1  . nebivolol (BYSTOLIC) 10 MG tablet TAKE 1 TABLET(10 MG) BY MOUTH DAILY AT 8 PM 90 tablet 3  . omeprazole (PRILOSEC) 20 MG capsule Take 20 mg by mouth daily as needed.    [name] Kitchen Respiratory Therapy Supplies (FLUTTER) DEVI Use as directed 1 each 0  . ezetimibe (ZETIA) 10 MG tablet Take 1 tablet (10 mg total) by mouth daily. 90 tablet 3   No current facility-administered medications on file prior to visit.   Review of Systems All otherwise neg per pt    Objective:   Physical Exam BP (!)  150/86 (BP Location: Left Arm, Patient Position: Sitting, Cuff Size: Large)   Pulse (!) 55   Temp 98.8 F (37.1 C) (Oral)   Ht 5' 4"  (1.626 m)   Wt 137 lb (62.1 kg)   SpO2 94%   BMI 23.52 kg/m  VS noted,  Constitutional: Pt appears in NAD HENT: Head: NCAT.  Right Ear: External ear normal.  Left Ear: External ear normal.  Eyes: . Pupils are equal, round, and reactive to light. Conjunctivae and EOM are normal Nose: without d/c or deformity Neck: Neck supple. Gross normal ROM Cardiovascular: Normal rate and regular rhythm.   Pulmonary/Chest: Effort normal and breath sounds without rales or wheezing.  Abd:  Soft, NT, ND, + BS, no organomegaly Neurological: Pt is alert. At baseline orientation, motor grossly intact, has some sT memory difficulty Skin: Skin is warm. No rashes, other new lesions, no LE edema Psychiatric: Pt behavior is normal without agitation  All otherwise neg per pt Lab Results  Component Value Date   WBC 4.3 01/12/2020   HGB 12.6 01/12/2020   HCT 35.7 01/12/2020   PLT 267 01/12/2020   GLUCOSE 88 01/12/2020   CHOL 219 (H) 01/12/2020   TRIG 118 01/12/2020   HDL 99 01/12/2020   LDLDIRECT 145.7 09/01/2013   LDLCALC 100 (H) 01/12/2020   ALT 28 01/12/2020   AST 33 01/12/2020   NA 135 01/12/2020   K 4.2 01/12/2020   CL 95 (L) 01/12/2020   CREATININE 0.73 01/12/2020   BUN 7 (L) 01/12/2020   CO2 24 01/12/2020   TSH 0.265 (L) 01/12/2020      Assessment & Plan:

## 2020-07-15 ENCOUNTER — Encounter: Payer: Self-pay | Admitting: Internal Medicine

## 2020-07-15 DIAGNOSIS — R413 Other amnesia: Secondary | ICD-10-CM | POA: Insufficient documentation

## 2020-07-15 NOTE — Assessment & Plan Note (Addendum)
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

## 2020-07-15 NOTE — Assessment & Plan Note (Signed)
stable overall by history and exam, recent data reviewed with pt, and pt to continue medical treatment as before,  to f/u any worsening symptoms or concerns  

## 2020-07-18 ENCOUNTER — Encounter: Payer: Self-pay | Admitting: Neurology

## 2020-07-21 ENCOUNTER — Other Ambulatory Visit: Payer: Self-pay | Admitting: Obstetrics and Gynecology

## 2020-07-21 DIAGNOSIS — Z1231 Encounter for screening mammogram for malignant neoplasm of breast: Secondary | ICD-10-CM

## 2020-07-27 ENCOUNTER — Ambulatory Visit (INDEPENDENT_AMBULATORY_CARE_PROVIDER_SITE_OTHER): Payer: Medicare Other

## 2020-07-27 VITALS — Ht 64.0 in

## 2020-07-27 DIAGNOSIS — Z0000 Encounter for general adult medical examination without abnormal findings: Secondary | ICD-10-CM

## 2020-07-27 NOTE — Patient Instructions (Addendum)
Marissa [name] , Thank you for taking time to come for your Medicare Wellness Visit. I appreciate your ongoing commitment to your health goals. Please review the following plan we discussed and let me know if I can assist you in the future.   Screening recommendations/referrals: Colonoscopy: 02/28/2015; due every 10 years (02/2025) Mammogram: 09/13/2019 Bone Density: 02/02/2015 Recommended yearly ophthalmology/optometry visit for glaucoma screening and checkup Recommended yearly dental visit for hygiene and checkup  Vaccinations: Influenza vaccine: 07/14/2020 Pneumococcal vaccine: completed Tdap vaccine: 07/29/2011 (due 07/2021) Shingles vaccine: never done   Covid-19: completed  Advanced directives: Please bring a copy of your health care power of attorney and living will to the office at your convenience.  Conditions/risks identified: Yes; Reviewed health maintenance screenings with patient today and relevant education, vaccines, and/or referrals were provided. Please continue to do your personal lifestyle choices by: daily care of teeth and gums, regular physical activity (goal should be 5 days a week for 30 minutes), eat a healthy diet, avoid tobacco and drug use, limiting any alcohol intake, taking a low-dose aspirin (if not allergic or have been advised by your provider otherwise) and taking vitamins and minerals as recommended by your provider. Continue doing brain stimulating activities (puzzles, reading, adult coloring books, staying active) to keep memory sharp. Continue to eat heart healthy diet (full of fruits, vegetables, whole grains, lean protein, water--limit salt, fat, and sugar intake) and increase physical activity as tolerated.  Next appointment: Please schedule your next Medicare Wellness Visit with your Nurse Health Advisor in 1 year by calling [phone].  Preventive Care 67 Years and Older, Female Preventive care refers to lifestyle choices and visits with your health care  provider that can promote health and wellness. What does preventive care include?  A yearly physical exam. This is also called an annual well check.  Dental exams once or twice a year.  Routine eye exams. Ask your health care provider how often you should have your eyes checked.  Personal lifestyle choices, including:  Daily care of your teeth and gums.  Regular physical activity.  Eating a healthy diet.  Avoiding tobacco and drug use.  Limiting alcohol use.  Practicing safe sex.  Taking low-dose aspirin every day.  Taking vitamin and mineral supplements as recommended by your health care provider. What happens during an annual well check? The services and screenings done by your health care provider during your annual well check will depend on your age, overall health, lifestyle risk factors, and family history of disease. Counseling  Your health care provider may ask you questions about your:  Alcohol use.  Tobacco use.  Drug use.  Emotional well-being.  Home and relationship well-being.  Sexual activity.  Eating habits.  History of falls.  Memory and ability to understand (cognition).  Work and work Statistician.  Reproductive health. Screening  You may have the following tests or measurements:  Height, weight, and BMI.  Blood pressure.  Lipid and cholesterol levels. These may be checked every 5 years, or more frequently if you are over 64 years old.  Skin check.  Lung cancer screening. You may have this screening every year starting at age 63 if you have a 30-pack-year history of smoking and currently smoke or have quit within the past 15 years.  Fecal occult blood test (FOBT) of the stool. You may have this test every year starting at age 37.  Flexible sigmoidoscopy or colonoscopy. You may have a sigmoidoscopy every 5 years or a colonoscopy every 10  years starting at age 58.  Hepatitis C blood test.  Hepatitis B blood test.  Sexually  transmitted disease (STD) testing.  Diabetes screening. This is done by checking your blood sugar (glucose) after you have not eaten for a while (fasting). You may have this done every 1-3 years.  Bone density scan. This is done to screen for osteoporosis. You may have this done starting at age 34.  Mammogram. This may be done every 1-2 years. Talk to your health care provider about how often you should have regular mammograms. Talk with your health care provider about your test results, treatment options, and if necessary, the need for more tests. Vaccines  Your health care provider may recommend certain vaccines, such as:  Influenza vaccine. This is recommended every year.  Tetanus, diphtheria, and acellular pertussis (Tdap, Td) vaccine. You may need a Td booster every 10 years.  Zoster vaccine. You may need this after age 97.  Pneumococcal 13-valent conjugate (PCV13) vaccine. One dose is recommended after age 84.  Pneumococcal polysaccharide (PPSV23) vaccine. One dose is recommended after age 65. Talk to your health care provider about which screenings and vaccines you need and how often you need them. This information is not intended to replace advice given to you by your health care provider. Make sure you discuss any questions you have with your health care provider. Document Released: 11/10/2015 Document Revised: 07/03/2016 Document Reviewed: 08/15/2015 Elsevier Interactive Patient Education  2017 [employer] Prevention in the Home Falls can cause injuries. They can happen to people of all ages. There are many things you can do to make your home safe and to help prevent falls. What can I do on the outside of my home?  Regularly fix the edges of walkways and driveways and fix any cracks.  Remove anything that might make you trip as you walk through a door, such as a raised step or threshold.  Trim any bushes or trees on the path to your home.  Use bright outdoor  lighting.  Clear any walking paths of anything that might make someone trip, such as rocks or tools.  Regularly check to see if handrails are loose or broken. Make sure that both sides of any steps have handrails.  Any raised decks and porches should have guardrails on the edges.  Have any leaves, snow, or ice cleared regularly.  Use sand or salt on walking paths during winter.  Clean up any spills in your garage right away. This includes oil or grease spills. What can I do in the bathroom?  Use night lights.  Install grab bars by the toilet and in the tub and shower. Do not use towel bars as grab bars.  Use non-skid mats or decals in the tub or shower.  If you need to sit down in the shower, use a plastic, non-slip stool.  Keep the floor dry. Clean up any water that spills on the floor as soon as it happens.  Remove soap buildup in the tub or shower regularly.  Attach bath mats securely with double-sided non-slip rug tape.  Do not have throw rugs and other things on the floor that can make you trip. What can I do in the bedroom?  Use night lights.  Make sure that you have a light by your bed that is easy to reach.  Do not use any sheets or blankets that are too big for your bed. They should not hang down onto the floor.  Have  a firm chair that has side arms. You can use this for support while you get dressed.  Do not have throw rugs and other things on the floor that can make you trip. What can I do in the kitchen?  Clean up any spills right away.  Avoid walking on wet floors.  Keep items that you use a lot in easy-to-reach places.  If you need to reach something above you, use a strong step stool that has a grab bar.  Keep electrical cords out of the way.  Do not use floor polish or wax that makes floors slippery. If you must use wax, use non-skid floor wax.  Do not have throw rugs and other things on the floor that can make you trip. What can I do with my  stairs?  Do not leave any items on the stairs.  Make sure that there are handrails on both sides of the stairs and use them. Fix handrails that are broken or loose. Make sure that handrails are as long as the stairways.  Check any carpeting to make sure that it is firmly attached to the stairs. Fix any carpet that is loose or worn.  Avoid having throw rugs at the top or bottom of the stairs. If you do have throw rugs, attach them to the floor with carpet tape.  Make sure that you have a light switch at the top of the stairs and the bottom of the stairs. If you do not have them, ask someone to add them for you. What else can I do to help prevent falls?  Wear shoes that:  Do not have high heels.  Have rubber bottoms.  Are comfortable and fit you well.  Are closed at the toe. Do not wear sandals.  If you use a stepladder:  Make sure that it is fully opened. Do not climb a closed stepladder.  Make sure that both sides of the stepladder are locked into place.  Ask someone to hold it for you, if possible.  Clearly mark and make sure that you can see:  Any grab bars or handrails.  First and last steps.  Where the edge of each step is.  Use tools that help you move around (mobility aids) if they are needed. These include:  Canes.  Walkers.  Scooters.  Crutches.  Turn on the lights when you go into a dark area. Replace any light bulbs as soon as they burn out.  Set up your furniture so you have a clear path. Avoid moving your furniture around.  If any of your floors are uneven, fix them.  If there are any pets around you, be aware of where they are.  Review your medicines with your doctor. Some medicines can make you feel dizzy. This can increase your chance of falling. Ask your doctor what other things that you can do to help prevent falls. This information is not intended to replace advice given to you by your health care provider. Make sure you discuss any  questions you have with your health care provider. Document Released: 08/10/2009 Document Revised: 03/21/2016 Document Reviewed: 11/18/2014 Elsevier Interactive Patient Education  2017 [employer].

## 2020-07-27 NOTE — Progress Notes (Signed)
I connected with Marissa [name] Suhr today by telephone and verified that I am speaking with the correct person using two identifiers. Location patient: home Location provider: work Persons participating in the virtual visit: Avah Bashor and [employer]. Rand Boller, LPN.   I discussed the limitations, risks, security and privacy concerns of performing an evaluation and management service by telephone and the availability of in person appointments. I also discussed with the patient that there may be a patient responsible charge related to this service. The patient expressed understanding and verbally consented to this telephonic visit.    Interactive audio and video telecommunications were attempted between this provider and patient, however failed, due to patient having technical difficulties OR patient did not have access to video capability.  We continued and completed visit with audio only.  Some vital signs may be absent or patient reported.   Time Spent with patient on telephone encounter: 35 minutes  Subjective:   Marissa [name] is a 74 y.o. female who presents for Medicare Annual (Subsequent) preventive examination.  Review of Systems    No ROS. Medicare Wellness Visit. Cardiac Risk Factors include: advanced age (>86mn, >>27women);dyslipidemia;family history of premature cardiovascular disease;hypertension     Objective:    Today's Vitals   07/27/20 1319  Height: 5' 4"  (1.626 m)  PainSc: 7    Body mass index is 23.52 kg/m.  Advanced Directives 07/27/2020 10/22/2019 07/26/2019 12/22/2018 12/21/2018 07/24/2018 07/14/2017  Does Patient Have a Medical Advance Directive? Yes No Yes No No Yes Yes  Type of Advance Directive Living will;Healthcare Power of [place] will - - [place]St. JoLiving will [place]Living will  Does patient want to make changes to medical advance directive? No - Patient declined - - - - - -   Copy of HHartfordin Chart? No - copy requested - No - copy requested - - No - copy requested No - copy requested  Would patient like information on creating a medical advance directive? - - - No - Patient declined No - Patient declined - -    Current Medications (verified) Outpatient Encounter Medications as of 07/27/2020  Medication Sig   ALPRAZolam (XANAX) 0.25 MG tablet TAKE 1 TABLET(0.25 MG) BY MOUTH DAILY AS NEEDED FOR ANXIETY   amLODipine (NORVASC) 10 MG tablet Take 1 tablet (10 mg total) by mouth daily.   carisoprodol (SOMA) 350 MG tablet Take 1 tablet (350 mg total) by mouth 3 (three) times daily as needed for muscle spasms.   cloNIDine (CATAPRES) 0.1 MG tablet Take 1 tablet (0.1 mg total) by mouth 2 (two) times daily as needed.   Eszopiclone 3 MG TABS TAKE 1 TABLET BY MOUTH IMMEDIATELY BEFORE BEDTIME   ezetimibe (ZETIA) 10 MG tablet Take 1 tablet (10 mg total) by mouth daily.   famotidine (PEPCID) 20 MG tablet Take 20 mg by mouth as needed.    feeding supplement, GLUCERNA SHAKE, (GLUCERNA SHAKE) LIQD Take 237 mLs by mouth daily.   FLUoxetine (PROZAC) 20 MG tablet Take 3 tablets (60 mg total) by mouth daily.   levalbuterol (XOPENEX HFA) 45 MCG/ACT inhaler Inhale 1-2 puffs into the lungs every 8 (eight) hours as needed for wheezing.   levothyroxine (SYNTHROID) 125 MCG tablet TAKE 1 TABLET(125 MCG) BY MOUTH DAILY   MINIVELLE 0.05 MG/24HR patch Place 1 patch onto the skin 2 (two) times a week.    montelukast (SINGULAIR) 10 MG tablet TAKE 1  TABLET(10 MG) BY MOUTH AT BEDTIME   MYRBETRIQ 50 MG TB24 tablet TAKE 1 TABLET(50 MG) BY MOUTH DAILY   nebivolol (BYSTOLIC) 10 MG tablet TAKE 1 TABLET(10 MG) BY MOUTH DAILY AT 8 PM   omeprazole (PRILOSEC) 20 MG capsule Take 20 mg by mouth daily as needed.   Respiratory Therapy Supplies (FLUTTER) DEVI Use as directed   No facility-administered encounter medications on file as of 07/27/2020.    Allergies  (verified) Fentanyl, Rosuvastatin, Amoxicillin, Hctz [hydrochlorothiazide], Hydralazine, Codeine, Conjugated estrogens, Erythromycin, and Piroxicam   History: Past Medical History:  Diagnosis Date   Arthritis    Asthma    Bicuspid aortic valve 05/2017   Likely functional bicuspid aortic valve with sclerosis and no stenosis.   Bursitis    Cataract    mild   Dysthymia    Hemorrhoids    Hx of ulcerative colitis    per dr Arnoldo Morale as per pt.   Hyperlipidemia    Hypertension    managed - labile.   Migraine    Scoliosis    Slurred speech    temporal lobe area that is not a tumor causes occ slurred speech and inability to communicate/ words will not come out at the correct time   Spinal stenosis    Thoracic aortic aneurysm ([place])    Stable 4.2-4.3 cm (followed by Dr. Roxan Hockey)   Thyroid disease    Past Surgical History:  Procedure Laterality Date   ABDOMINAL HYSTERECTOMY     BREAST EXCISIONAL BIOPSY Left    BUNIONECTOMY     Cardiac Event Monitor  07/2017   Overall relatively normal.  Normal sinus rhythm with rare bradycardia and tachycardia.  Heart rate ranged from 55-110 bpm.  Occasional PACs and PVCs, every single 1 was felt.  No arrhythmias other than one short 4 beat run of PACs.   COLONOSCOPY  02-04-2005   all normal    FOOT SURGERY     3 pins in toes    HAND SURGERY     left thumb joint resection   nasal revision     NM MYOVIEW LTD  07/2017   LOW RISK study.  No ischemia or infarction.  EF greater than 65%.    SINUS IRRIGATION     TONSILLECTOMY AND ADENOIDECTOMY     TRANSTHORACIC ECHOCARDIOGRAM  06/'1, 8/'19   a) Moderate LVH. Normal EF 60-65%. Normal diastolic parameters. --> Difficult to fully visualize the aortic valve. Cannot exclude bicuspid valve. Mild aortic stenosis noted. No PFO. Mildly dilated left atrium. Trivial MR. No comment on mitral valve prolapse. Moderately dilated ascending aorta.;; b) F/u Echo To evaluate the aortic valve.  -- Bicuspid AoV - mildly thickened / calcified. - No stenosis   TRANSTHORACIC ECHOCARDIOGRAM  11/2018   Normal LV size and function.  EF 60-65%.  GR 1 DD.  Normal RV size and function.  Moderate aortic valve calcification with mild Aortic Stenosis.  Normal LV size and function.  EF 60-65%.  GR 1 DD.  Normal RV size and function.  Moderate aortic calcification with mild stenosis.  Mild dilation of ascending aorta ~4.2 mm   Family History  Problem Relation Age of Onset   Arthritis Mother    Leukemia Father    Heart disease Maternal Uncle    Diabetes Paternal Aunt    Breast cancer Maternal Grandmother    Heart disease Maternal Grandfather    Breast cancer Paternal Grandmother    Colon cancer Neg Hx    Social History  Socioeconomic History   Marital status: Married    Spouse name: Not on file   Number of children: 2   Years of education: Not on file   Highest education level: Not on file  Occupational History   Occupation: retired  Tobacco Use   Smoking status: Never Smoker   Smokeless tobacco: Never Used  Scientific laboratory technician Use: Never used  Substance and Sexual Activity   Alcohol use: Yes    Alcohol/week: 0.0 standard drinks    Comment: occ    Drug use: No   Sexual activity: Not Currently  Other Topics Concern   Not on file  Social History Narrative   Not on file   Social Determinants of Health   Financial Resource Strain: Low Risk    Difficulty of Paying Living Expenses: Not hard at all  Food Insecurity: No Food Insecurity   Worried About Charity fundraiser in the Last Year: Never true   [place] in the Last Year: Never true  Transportation Needs: No Transportation Needs   Lack of Transportation (Medical): No   Lack of Transportation (Non-Medical): No  Physical Activity: Inactive   Days of Exercise per Week: 0 days   Minutes of Exercise per Session: 0 min  Stress: No Stress Concern Present   Feeling of Stress : Not at all   Social Connections:    Frequency of Communication with Friends and Family: Not on file   Frequency of Social Gatherings with Friends and Family: Not on file   Attends Religious Services: Not on Electrical engineer or Organizations: Not on file   Attends Archivist Meetings: Not on file   Marital Status: Not on file    Tobacco Counseling Counseling given: Not Answered   Clinical Intake:  Pre-visit preparation completed: Yes  Pain : 0-10 Pain Score: 7  Pain Type: Chronic pain Pain Location: Neck Pain Radiating Towards: back (pinched nerve), right shoulder Pain Descriptors / Indicators: Constant Pain Onset: More than a month ago Pain Frequency: Constant Pain Relieving Factors: Soma Effect of Pain on Daily Activities: Pain produces disability and affects the way in which the participants are able to perform their daily  Pain Relieving Factors: Soma  Nutritional Risks: None Diabetes: No  How often do you need to have someone help you when you read instructions, pamphlets, or other written materials from your doctor or pharmacy?: 1 - Never What is the last grade level you completed in school?: RN  Diabetic? no  Interpreter Needed?: No  Information entered by :: Valisha Heslin N. Shields Pautz, LPN   Activities of Daily Living In your present state of health, do you have any difficulty performing the following activities: 07/27/2020  Hearing? N  Vision? N  Difficulty concentrating or making decisions? N  Walking or climbing stairs? N  Dressing or bathing? N  Doing errands, shopping? N  Preparing Food and eating ? N  Using the Toilet? N  In the past six months, have you accidently leaked urine? N  Do you have problems with loss of bowel control? N  Managing your Medications? N  Managing your Finances? N  Housekeeping or managing your Housekeeping? N  Some recent data might be hidden    Patient Care Team: [name], MD as PCP - General  (Internal Medicine) [name], MD as PCP - Cardiology (Cardiology) Melrose Nakayama, MD as Consulting Physician (Cardiothoracic Surgery) [name], MD as Consulting  Physician (Cardiology) Tanda Rockers, MD as Consulting Physician (Pulmonary Disease)  Indicate any recent Medical Services you may have received from other than [facility] providers in the past year (date may be approximate).     Assessment:   This is a routine wellness examination for Marissa [name].  Hearing/Vision screen No exam data present  Dietary issues and exercise activities discussed: Current Exercise Habits: The patient does not participate in regular exercise at present, Exercise limited by: orthopedic condition(s);psychological condition(s);Other - see comments (chronic pain)  Goals     Blood Pressure < 130/80     LDL CALC < 100     Stay as healthy as possible     Focus on my health, stay active, enjoy life, be active in my grand-child's life, and grow old with my husband.       Depression Screen PHQ 2/9 Scores 07/27/2020 02/21/2020 07/26/2019 07/24/2018 07/14/2017 03/28/2016 06/20/2015  PHQ - 2 Score 0 4 1 2 2  0 3  PHQ- 9 Score - 12 5 8 5  - 12    Fall Risk Fall Risk  07/27/2020 02/21/2020 07/26/2019 07/24/2018 07/14/2017  Falls in the past year? 0 1 1 No Yes  Number falls in past yr: 0 1 1 - 2 or more  Injury with Fall? 0 1 1 - -  Risk for fall due to : History of fall(s);Impaired balance/gait;Medication side effect;Mental status change - History of fall(s);Impaired balance/gait Impaired balance/gait;Impaired mobility Impaired mobility  Follow up Falls evaluation completed - Falls prevention discussed - Falls prevention discussed;Education provided    Any stairs in or around the home? Yes  If so, are there any without handrails? No  Home free of loose throw rugs in walkways, pet beds, electrical cords, etc? Yes  Adequate lighting in your home to reduce risk of falls? Yes   ASSISTIVE DEVICES UTILIZED  TO PREVENT FALLS:  Life alert? No  Use of a cane, walker or w/c? No  Grab bars in the bathroom? No  Shower chair or bench in shower? No  Elevated toilet seat or a handicapped toilet? No   TIMED UP AND GO:  Was the test performed? No .  Length of time to ambulate 10 feet: 0 sec.   Gait steady and fast without use of assistive device  Cognitive Function: MMSE - Mini Mental State Exam 07/27/2020 07/24/2018 07/14/2017 06/20/2015  Not completed: Unable to complete - - Unable to complete  Orientation to time - 4 5 -  Orientation to Place - 5 5 -  Registration - 3 3 -  Attention/ Calculation - 5 5 -  Recall - 1 1 -  Language- name 2 objects - 2 2 -  Language- repeat - 1 1 -  Language- follow 3 step command - 3 3 -  Language- read & follow direction - 1 1 -  Write a sentence - 1 1 -  Copy design - 1 1 -  Total score - 27 28 -        Immunizations Immunization History  Administered Date(s) Administered   Fluad Quad(high Dose 65+) 07/14/2020   Hep A / Hep B 02/24/2018, 06/25/2018   Hepatitis B, adult 03/26/2018   Influenza Split 09/12/2011, 08/19/2012   Influenza Whole 08/20/2007, 07/29/2008, 08/07/2009, 07/25/2010   Influenza, High Dose Seasonal PF 07/09/2016, 07/14/2017, 07/24/2018   Influenza,inj,Quad PF,6+ Mos 07/19/2013, 08/16/2014, 07/13/2015   Influenza-Unspecified 07/15/2019   PFIZER SARS-COV-2 Vaccination 11/17/2019, 12/08/2019   Pneumococcal Conjugate-13 01/06/2015   Pneumococcal Polysaccharide-23 07/19/2013  Tdap 07/29/2011   Zoster 10/28/2012    TDAP status: Up to date Flu Vaccine status: Up to date Pneumococcal vaccine status: Up to date Covid-19 vaccine status: Completed vaccines  Qualifies for Shingles Vaccine? Yes   Zostavax completed Yes   Shingrix Completed?: No.    Education has been provided regarding the importance of this vaccine. Patient has been advised to call insurance company to determine out of pocket expense if they have not yet  received this vaccine. Advised may also receive vaccine at local pharmacy or Health Dept. Verbalized acceptance and understanding.  Screening Tests Health Maintenance  Topic Date Due   TETANUS/TDAP  07/28/2021   COLONOSCOPY  02/27/2025   INFLUENZA VACCINE  Completed   DEXA SCAN  Completed   COVID-19 Vaccine  Completed   Hepatitis C Screening  Completed   PNA vac Low Risk Adult  Completed    Health Maintenance  There are no preventive care reminders to display for this patient.  Colorectal cancer screening: Completed 02/28/2015. Repeat every 10 years Mammogram status: Completed 09/13/2019. Repeat every year Bone Density status: Completed 02/02/2015. Results reflect: Bone density results: NORMAL. Repeat every 3-5 years.  Lung Cancer Screening: (Low Dose CT Chest recommended if Age 43-80 years, 30 pack-year currently smoking OR have quit w/in 15years.) does not qualify.   Lung Cancer Screening Referral: no  Additional Screening:  Hepatitis C Screening: does qualify; Completed yes  Vision Screening: Recommended annual ophthalmology exams for early detection of glaucoma and other disorders of the eye. Is the patient up to date with their annual eye exam?  Yes  Who is the provider or what is the name of the office in which the patient attends annual eye exams? Marshall Cork, MD at [facility] If pt is not established with a provider, would they like to be referred to a provider to establish care? No .   Dental Screening: Recommended annual dental exams for proper oral hygiene  Community Resource Referral / Chronic Care Management: CRR required this visit?  No   CCM required this visit?  No      Plan:     I have personally reviewed and noted the following in the patients chart:    Medical and social history  Use of alcohol, tobacco or illicit drugs   Current medications and supplements  Functional ability and status  Nutritional status  Physical  activity  Advanced directives  List of other physicians  Hospitalizations, surgeries, and ER visits in previous 12 months  Vitals  Screenings to include cognitive, depression, and falls  Referrals and appointments  In addition, I have reviewed and discussed with patient certain preventive protocols, quality metrics, and best practice recommendations. A written personalized care plan for preventive services as well as general preventive health recommendations were provided to patient.     Sheral Flow, LPN   [id number]/86/7544   Nurse Notes:  Patient is having cognitive issues; MRI of Brain has been ordered; Patient is scheduled with Neurology in January 2022. There were no vitals filed for this visit. There is no height or weight on file to calculate BMI. Patient stated that she has no issues with gait or balance; does not use any assistive devices.

## 2020-07-31 ENCOUNTER — Encounter: Payer: Self-pay | Admitting: Internal Medicine

## 2020-08-01 ENCOUNTER — Encounter: Payer: Self-pay | Admitting: Thoracic Surgery (Cardiothoracic Vascular Surgery)

## 2020-08-01 ENCOUNTER — Other Ambulatory Visit: Payer: Self-pay

## 2020-08-01 ENCOUNTER — Ambulatory Visit: Payer: Medicare Other | Admitting: Thoracic Surgery (Cardiothoracic Vascular Surgery)

## 2020-08-01 ENCOUNTER — Other Ambulatory Visit: Payer: Self-pay | Admitting: Thoracic Surgery (Cardiothoracic Vascular Surgery)

## 2020-08-01 VITALS — BP 193/100 | HR 72 | Temp 97.5°F | Resp 18 | Ht 64.0 in | Wt 138.2 lb

## 2020-08-01 DIAGNOSIS — I712 Thoracic aortic aneurysm, without rupture, unspecified: Secondary | ICD-10-CM

## 2020-08-01 MED ORDER — CLONIDINE HCL 0.2 MG PO TABS: 0.1000 mg | ORAL_TABLET | Freq: Two times a day (BID) | ORAL | 1 refills | Status: DC

## 2020-08-01 NOTE — Progress Notes (Signed)
Marissa [name] 411       Marissa [name],Marissa [name] [id number]             [phone]     HPI: Marissa [name] returns for follow-up regarding an ascending aneurysm  Marissa [name] is a 74 year old woman with a history of hypertension, hyperlipidemia, mitral prolapse, bicuspid aortic valve, mild aortic stenosis, asthma, arthritis, ulcerative colitis, chronic pain, 4.3 cm ascending aneurysm, and cognitive decline.  She was found to have an ascending aneurysm in 2012.  She has been followed since then.  Most recently was 4.3 cm in September 2020.  She complains of headaches, fatigue, and memory problems.  She has had some occasional chest discomfort.  She has been measuring her blood pressure at home and says it is usually around 140 but has been as high as 180.  She has not seen any systolics of less than 568.  She has tried multiple blood pressure medications in the past and has had stop several due to side effects.  Past Medical History:  Diagnosis Date  . Arthritis   . Asthma   . Bicuspid aortic valve 05/2017   Likely functional bicuspid aortic valve with sclerosis and no stenosis.  . Bursitis   . Cataract    mild  . Dysthymia   . Hemorrhoids   . Hx of ulcerative colitis    per dr Marissa [name] as per pt.  . Hyperlipidemia   . Hypertension    managed - labile.  . Migraine   . Scoliosis   . 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 Spinal stenosis   . Thoracic aortic aneurysm ([facility])    Stable 4.2-4.3 cm (followed by Dr. Roxan [name])  . Thyroid disease     Current Outpatient Medications  Medication Sig Dispense Refill  . ALPRAZolam (XANAX) 0.25 MG tablet TAKE 1 TABLET(0.25 MG) BY MOUTH DAILY AS NEEDED FOR ANXIETY 30 tablet 4  . amLODipine (NORVASC) 10 MG tablet Take 1 tablet (10 mg total) by mouth daily. 90 tablet 3  . carisoprodol (SOMA) 350 MG tablet Take 1 tablet  (350 mg total) by mouth 3 (three) times daily as needed for muscle spasms. 60 tablet 3  . cloNIDine (CATAPRES) 0.2 MG tablet Take 0.5 tablets (0.1 mg total) by mouth 2 (two) times daily. 60 tablet 1  . Eszopiclone 3 MG TABS TAKE 1 TABLET BY MOUTH IMMEDIATELY BEFORE BEDTIME 90 tablet 1  . famotidine (PEPCID) 20 MG tablet Take 20 mg by mouth as needed.     . feeding supplement, GLUCERNA SHAKE, (GLUCERNA SHAKE) LIQD Take 237 mLs by mouth daily.    Marissa Kitchen [name] (PROZAC) 20 MG tablet Take 3 tablets (60 mg total) by mouth daily. 90 tablet 3  . levalbuterol (XOPENEX HFA) 45 MCG/ACT inhaler Inhale 1-2 puffs into the lungs every 8 (eight) hours as needed for wheezing. 1 Inhaler 2  . levothyroxine (SYNTHROID) 125 MCG tablet TAKE 1 TABLET(125 MCG) BY MOUTH DAILY 90 tablet 1  . MINIVELLE 0.05 MG/24HR patch Place 1 patch onto the skin 2 (two) times a week.   3  . montelukast (SINGULAIR) 10 MG tablet TAKE 1 TABLET(10 MG) BY MOUTH AT BEDTIME 90 tablet 1  . MYRBETRIQ 50 MG TB24 tablet TAKE 1 TABLET(50 MG) BY MOUTH DAILY 90 tablet 1  . nebivolol (BYSTOLIC) 10 MG tablet TAKE  1 TABLET(10 MG) BY MOUTH DAILY AT 8 PM 90 tablet 3  . omeprazole (PRILOSEC) 20 MG capsule Take 20 mg by mouth daily as needed.    Marissa Kitchen Respiratory Therapy Supplies (FLUTTER) DEVI Use as directed 1 each 0  . ezetimibe (ZETIA) 10 MG tablet Take 1 tablet (10 mg total) by mouth daily. 90 tablet 3   No current facility-administered medications for this visit.    Physical Exam Ht 5' 4"  (1.626 m)   BMI 23.41 kg/m  74 year old woman in no acute distress Alert and oriented x3 with no focal deficits Carotids + transmitted murmur Cardiac regular rate and rhythm with a 3/6 systolic murmur right upper sternal border Lungs clear bilaterally, no rales or wheezing No peripheral edema  Diagnostic Tests: MR CHEST WITHOUT AND WITH CONTRAST  TECHNIQUE: Angiographic multiplanar, multisequence MR imaging of the chest was performed before and after the  administration of intravenous contrast.  CONTRAST:  16m MULTIHANCE GADOBENATE DIMEGLUMINE 529 MG/ML IV SOLN  COMPARISON:  07/15/2019  FINDINGS: Vascular:  Aorta: Greatest diameter of the ascending aorta is estimated 4.3 cm, unchanged from the comparison.  No evidence of dissection.  No periaortic inflammatory signal.  Branch vessels demonstrate maintained flow signal. Mild calcified plaque/signal loss at the proximal left common carotid artery with flow signal maintained. Diameter of the aorta at the hiatus measures 2.2 cm.  Pulmonary arteries: Main pulmonary artery measures 23 mm. Flow signal maintained.  Heart: Heart size unchanged, within normal limits. No pericardial fluid.  Abdominal vasculature: Patency of celiac artery and SMA confirmed. Bilateral renal arteries are patent.  Nonvascular:  Mediastinum: Unremarkable thoracic inlet. Unremarkable visualized thoracic esophagus. No adenopathy.  Lungs/pleura: There are regions increased signal within the bilateral lungs, potentially representing airspace disease. No pleural effusion.  Musculoskeletal: Unremarkable signal of the visualized spine.  Upper abdomen: Unremarkable  IMPRESSION: Unchanged diameter of the ascending aorta, 4.3 cm.  There are subtle regions of abnormal airspace signal within the bilateral lungs, potentially multifocal infection, alternatively chronic scarring/fibrosis. Chest x-ray may be considered, if there is concern for signs/symptoms of infection.  Signed,  Marissa [name] RPVI  [facility]  [facility]   Electronically Signed   By: Marissa MckusickD.O.   On: 07/12/2020 14:28 I personally reviewed the MR images and concur with the findings noted above  Impression: Marissa [name] a 74year old woman with a history of hypertension, hyperlipidemia, mitral prolapse, bicuspid aortic valve, mild aortic  stenosis, asthma, arthritis, ulcerative colitis, chronic pain, 4.3 cm ascending aneurysm, and cognitive decline.  Ascending aneurysm/thoracic aortic atherosclerosis-remained stable at 4.3 cm.  Needs continued annual follow-up.  Hypertension-poorly controlled.  She is dangerously high today.  She essentially never has a normal blood pressure as every reading at home is above 140 and sometimes as high as 180.  She is currently only taking clonidine occasionally when she notes her blood pressures above 160.  I think she needs to be on that medication consistently.  With that aneurysms we need to keep her blood pressure below 140.  Mild aortic stenosis-possible bicuspid valve.  No echo in the last 18 months.  Will order 2D echocardiogram  Plan: Recommend follow-up with Dr. CSharlet Salinaand Dr. HEllyn HackClonidine 0.2 twice daily Return in 3 weeks to discuss results of echocardiogram and recheck blood pressure  SMelrose Nakayama MD [facility] [phone]

## 2020-08-10 ENCOUNTER — Other Ambulatory Visit: Payer: Self-pay | Admitting: Internal Medicine

## 2020-08-10 ENCOUNTER — Other Ambulatory Visit: Payer: Self-pay | Admitting: Pharmacist Clinician (PhC)/ Clinical Pharmacy Specialist

## 2020-08-15 ENCOUNTER — Other Ambulatory Visit: Payer: Self-pay

## 2020-08-15 ENCOUNTER — Ambulatory Visit (HOSPITAL_COMMUNITY): Payer: Medicare Other | Attending: Cardiovascular Disease

## 2020-08-15 DIAGNOSIS — I712 Thoracic aortic aneurysm, without rupture, unspecified: Secondary | ICD-10-CM

## 2020-08-15 HISTORY — PX: TRANSTHORACIC ECHOCARDIOGRAM: SHX275

## 2020-08-15 LAB — ECHOCARDIOGRAM COMPLETE
AR max vel: 1.17 cm2
AV Area VTI: 1.41 cm2
AV Area mean vel: 1.34 cm2
AV Mean grad: 10 mmHg
AV Peak grad: 18 mmHg
Ao pk vel: 2.12 m/s
Area-P 1/2: 3.2 cm2
S' Lateral: 2.7 cm

## 2020-08-16 ENCOUNTER — Other Ambulatory Visit: Payer: Self-pay | Admitting: Internal Medicine

## 2020-08-28 ENCOUNTER — Other Ambulatory Visit: Payer: Medicare Other

## 2020-08-29 ENCOUNTER — Ambulatory Visit: Payer: Medicare Other | Admitting: Thoracic Surgery (Cardiothoracic Vascular Surgery)

## 2020-08-29 ENCOUNTER — Other Ambulatory Visit: Payer: Self-pay

## 2020-08-29 ENCOUNTER — Encounter: Payer: Self-pay | Admitting: Thoracic Surgery (Cardiothoracic Vascular Surgery)

## 2020-08-29 VITALS — BP 150/82 | HR 63 | Resp 18 | Ht 64.0 in

## 2020-08-29 DIAGNOSIS — I712 Thoracic aortic aneurysm, without rupture, unspecified: Secondary | ICD-10-CM

## 2020-08-29 NOTE — Progress Notes (Signed)
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  I have followed her since 2012 when she was found to have an ascending aneurysm.  I saw her about a month ago for follow-up.  Her aneurysm was stable.  Her blood pressure was significantly elevated.  She had been on clonidine 0.2 mg twice daily as needed for systolic greater than 638.  I had her go ahead and start taking that medication twice daily.  It had been about a year and a half since her most recent echocardiogram so we repeated that as well.  She now returns for follow-up.  She says that she has noticed that she feels fatigued and weak in the mornings.  Past Medical History:  Diagnosis Date  . Arthritis   . Asthma   . Bicuspid aortic valve 05/2017   Likely functional bicuspid aortic valve with sclerosis and no stenosis.  . Bursitis   . Cataract    mild  . Dysthymia   . Hemorrhoids   . Hx of ulcerative colitis    per dr Marissa [name] as per pt.  . Hyperlipidemia   . Hypertension    managed - labile.  . Migraine   . Scoliosis   . 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 Spinal stenosis   . Thoracic aortic aneurysm (HCC)    Stable 4.2-4.3 cm (followed by Dr. Roxan [name])  . Thyroid disease     Current Outpatient Medications  Medication Sig Dispense Refill  . ALPRAZolam (XANAX) 0.25 MG tablet TAKE 1 TABLET(0.25 MG) BY MOUTH DAILY AS NEEDED FOR ANXIETY 30 tablet 4  . amLODipine (NORVASC) 10 MG tablet Take 1 tablet (10 mg total) by mouth daily. 90 tablet 3  .  carisoprodol (SOMA) 350 MG tablet Take 1 tablet (350 mg total) by mouth 3 (three) times daily as needed for muscle spasms. 60 tablet 3  . cloNIDine (CATAPRES) 0.2 MG tablet Take 0.5 tablets (0.1 mg total) by mouth 2 (two) times daily. 60 tablet 1  . Eszopiclone 3 MG TABS TAKE 1 TABLET BY MOUTH IMMEDIATELY BEFORE BEDTIME 90 tablet 1  . ezetimibe (ZETIA) 10 MG tablet TAKE 1 TABLET(10 MG) BY MOUTH DAILY 90 tablet 3  . famotidine (PEPCID) 20 MG tablet Take 20 mg by mouth as needed.     . feeding supplement, GLUCERNA SHAKE, (GLUCERNA SHAKE) LIQD Take 237 mLs by mouth daily.    [facility]issa [name] Kitchen FLUoxetine (PROZAC) 20 MG tablet TAKE 3 TABLETS(60 MG) BY MOUTH DAILY 90 tablet 0  . FLUoxetine (PROZAC) 40 MG capsule TAKE 1 CAPSULE(40 MG) BY MOUTH TWICE DAILY 180 capsule 0  . levalbuterol (XOPENEX HFA) 45 MCG/ACT inhaler Inhale 1-2 puffs into the lungs every 8 (eight) hours as needed for wheezing. 1 Inhaler 2  . levothyroxine (SYNTHROID) 125 MCG tablet TAKE 1 TABLET(125 MCG) BY MOUTH DAILY 90 tablet 1  . MINIVELLE 0.05 MG/24HR patch Place 1 patch onto the skin 2 (two) times a week.  3  . montelukast (SINGULAIR) 10 MG tablet TAKE 1 TABLET(10 MG) BY MOUTH AT BEDTIME 90 tablet 1  . MYRBETRIQ 50 MG TB24 tablet TAKE 1 TABLET(50 MG) BY MOUTH DAILY 90 tablet 1  . nebivolol (BYSTOLIC) 10 MG tablet TAKE 1 TABLET(10 MG) BY MOUTH DAILY AT 8 PM 90 tablet 3  . omeprazole (PRILOSEC) 20 MG capsule Take 20 mg by mouth daily as needed.    [facility]issa [name] Respiratory Therapy Supplies (FLUTTER) DEVI Use as directed 1 each 0   No current facility-administered medications for this visit.    Physical Exam BP (!) 150/82 (BP Location: Right Arm, Patient Position: Sitting)   Pulse 63   Resp 18   Ht 5' 4"  (1.626 m)   SpO2 98% Comment: RA with mask on  BMI 23.92 kg/m  74 year old woman in no acute distress Alert and oriented x3 Well-developed and well-nourished  Diagnostic Tests: Echocardiogram 08/15/2020 IMPRESSIONS    1. Left ventricular  ejection fraction, by estimation, is 60 to 65%. The  left ventricle has normal function. The left ventricle has no regional  wall motion abnormalities. Left ventricular diastolic parameters are  consistent with Grade II diastolic  dysfunction (pseudonormalization).  2. Right ventricular systolic function is normal. The right ventricular  size is normal. There is normal pulmonary artery systolic pressure.  3. Left atrial size was mildly dilated.  4. The mitral valve is normal in structure. Trivial mitral valve  regurgitation. No evidence of mitral stenosis.  5. The aortic valve is calcified. There is mild calcification of the  aortic valve. There is mild thickening of the aortic valve. Aortic valve  regurgitation is not visualized. Mild aortic valve stenosis. Aortic valve  area, by VTI measures 1.41 cm.  Aortic valve mean gradient measures 10.0 mmHg. Aortic valve Vmax measures  2.12 m/s.  6. Aortic dilatation noted. There is mild to moderate dilatation of the  ascending aorta, measuring 45 mm.  7. The inferior vena cava is normal in size with greater than 50%  respiratory variability, suggesting right atrial pressure of 3 mmHg.   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  I saw her about a month ago and her blood pressure was 578 systolic.  She said that she seldom if ever so below 140.  I advised her to start taking clonidine twice daily instead of as needed.  Since then her blood pressure has been better although she does feel some fatigue and malaise in the mornings.  She will follow up with Marissa [name] in [name] regarding that issue.  Echocardiogram showed stable mild AS with a valve area estimated at 1.4 cm.  Mean gradient of 10 mmHg.    Plan: Return with  MR angio in 1 year Follow-up with Marissa [name] and Marissa [name].  I spent over 10 minutes in review of records, images, and in consultation with Marissa [name] today. Marissa Nakayama, MD [facility] [phone]

## 2020-09-01 DIAGNOSIS — M25552 Pain in left hip: Secondary | ICD-10-CM | POA: Diagnosis not present

## 2020-09-09 ENCOUNTER — Other Ambulatory Visit: Payer: Self-pay | Admitting: Internal Medicine

## 2020-09-13 ENCOUNTER — Other Ambulatory Visit: Payer: Self-pay

## 2020-09-13 ENCOUNTER — Ambulatory Visit
Admission: RE | Admit: 2020-09-13 | Discharge: 2020-09-13 | Disposition: A | Discharge status: Home / Self Care | Payer: Medicare Other | Source: Ambulatory Visit | Attending: Obstetrics and Gynecology | Admitting: Obstetrics and Gynecology

## 2020-09-13 ENCOUNTER — Encounter: Payer: Self-pay | Admitting: Internal Medicine

## 2020-09-13 DIAGNOSIS — Z1231 Encounter for screening mammogram for malignant neoplasm of breast: Secondary | ICD-10-CM

## 2020-09-13 MED ORDER — CARISOPRODOL 350 MG PO TABS: 350.0000 mg | ORAL_TABLET | Freq: Three times a day (TID) | ORAL | 3 refills | Status: DC | PRN

## 2020-09-14 MED ORDER — CARISOPRODOL 350 MG PO TABS: 350.0000 mg | ORAL_TABLET | Freq: Three times a day (TID) | ORAL | 3 refills | Status: DC | PRN

## 2020-09-19 ENCOUNTER — Other Ambulatory Visit: Payer: Self-pay | Admitting: Internal Medicine

## 2020-09-19 ENCOUNTER — Encounter: Payer: Self-pay | Admitting: Internal Medicine

## 2020-09-19 ENCOUNTER — Other Ambulatory Visit: Payer: Self-pay

## 2020-09-19 ENCOUNTER — Other Ambulatory Visit: Payer: Self-pay | Admitting: Obstetrics and Gynecology

## 2020-09-19 DIAGNOSIS — R928 Other abnormal and inconclusive findings on diagnostic imaging of breast: Secondary | ICD-10-CM

## 2020-09-19 NOTE — Telephone Encounter (Signed)
Last RF per [place] [employer] 06/19/20 from order written on 02/21/20. Last OV with Dr Jenny Reichmann on 07/14/20 Next OV: no future visits

## 2020-09-20 DIAGNOSIS — M25552 Pain in left hip: Secondary | ICD-10-CM | POA: Diagnosis not present

## 2020-09-20 NOTE — Telephone Encounter (Signed)
Can you call patient and have her schedule visit within the 3 months for further refills?

## 2020-09-20 NOTE — Telephone Encounter (Signed)
LVM informing pt that the prescription she requested has been filled & requested she call back to make an appt within the next 3 months for further refills.

## 2020-09-27 ENCOUNTER — Other Ambulatory Visit: Payer: Self-pay | Admitting: Internal Medicine

## 2020-09-28 DIAGNOSIS — H04123 Dry eye syndrome of bilateral lacrimal glands: Secondary | ICD-10-CM | POA: Diagnosis not present

## 2020-10-02 ENCOUNTER — Other Ambulatory Visit: Payer: Self-pay

## 2020-10-02 ENCOUNTER — Ambulatory Visit
Admission: RE | Admit: 2020-10-02 | Discharge: 2020-10-02 | Disposition: A | Discharge status: Home / Self Care | Payer: Medicare Other | Source: Ambulatory Visit | Attending: Obstetrics and Gynecology | Admitting: Obstetrics and Gynecology

## 2020-10-02 DIAGNOSIS — R928 Other abnormal and inconclusive findings on diagnostic imaging of breast: Secondary | ICD-10-CM | POA: Diagnosis not present

## 2020-10-02 DIAGNOSIS — N6489 Other specified disorders of breast: Secondary | ICD-10-CM | POA: Diagnosis not present

## 2020-10-16 ENCOUNTER — Other Ambulatory Visit: Payer: Self-pay | Admitting: Internal Medicine

## 2020-10-25 ENCOUNTER — Other Ambulatory Visit: Payer: Self-pay | Admitting: Internal Medicine

## 2020-10-26 NOTE — Telephone Encounter (Signed)
Pt states she is not currently taking the fluoxetine; ran out 7-8 days ago. Pt states she was taking a different dosage prescribed by her doctor with the [facility] b/c she was unable to tolerate the dose prior to that (they felt it was too high).  She is unable to recall the dosages & says that she is to see a neurologist soon for her "cognitive issues".   She states she ran out of the dose prescribed by the home agency & was taking the 71m & that has not been bothering her.

## 2020-10-26 NOTE — Telephone Encounter (Signed)
Can you call and find out what she is currently taking and then resend to me with that info? I have no way of tracking this in the chart. Thanks

## 2020-10-26 NOTE — Telephone Encounter (Signed)
I would recommend visit to discuss further with her then. Please deny this.

## 2020-11-01 ENCOUNTER — Other Ambulatory Visit: Payer: Self-pay

## 2020-11-01 ENCOUNTER — Other Ambulatory Visit (INDEPENDENT_AMBULATORY_CARE_PROVIDER_SITE_OTHER): Payer: Medicare Other

## 2020-11-01 ENCOUNTER — Encounter: Payer: Self-pay | Admitting: Neurology

## 2020-11-01 ENCOUNTER — Ambulatory Visit: Payer: Medicare Other | Admitting: Neurology

## 2020-11-01 VITALS — BP 145/80 | HR 66 | Ht 64.0 in | Wt 142.6 lb

## 2020-11-01 DIAGNOSIS — R404 Transient alteration of awareness: Secondary | ICD-10-CM

## 2020-11-01 DIAGNOSIS — R413 Other amnesia: Secondary | ICD-10-CM

## 2020-11-01 LAB — VITAMIN B12: Vitamin B-12: 394 pg/mL (ref 211–911)

## 2020-11-01 LAB — TSH: TSH: 1.51 u[IU]/mL (ref 0.35–4.50)
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
## 2020-11-01 NOTE — Patient Instructions (Addendum)
1. Schedule MRI brain with and without contrast  2. Schedule 1-hour EEG  3. Schedule Neurocognitive testing, please have your husband present as well  4. Bloodwork for TSH, B12  5. Follow-up after tests, call for any changes   You have been referred for a neuropsychological evaluation (i.e., evaluation of memory and thinking abilities). Please bring someone with you to this appointment if possible, as it is helpful for the doctor to hear from both you and another adult who knows you well. Please bring eyeglasses and hearing aids if you wear them.    The evaluation will take approximately 3 hours and has two parts:   . The first part is a clinical interview with the neuropsychologist (Dr. Melvyn Novas or Dr. Nicole Kindred). During the interview, the neuropsychologist will speak with you and the individual you brought to the appointment.    . The second part of the evaluation is testing with the doctor's technician Hinton Dyer or Maudie Mercury). During the testing, the technician will ask you to remember different types of material, solve problems, and answer some questionnaires. Your family member will not be present for this portion of the evaluation.   Please note: We must reserve several hours of the neuropsychologist's time and the psychometrician's time for your evaluation appointment. As such, there is a No-Show fee of $100. If you are unable to attend any of your appointments, please contact our office as soon as possible to reschedule.    RECOMMENDATIONS FOR ALL PATIENTS WITH MEMORY PROBLEMS: 1. Continue to exercise (Recommend 30 minutes of walking everyday, or 3 hours every week) 2. Increase social interactions - continue going to [place] and enjoy social gatherings with friends and family 3. Eat healthy, avoid fried foods and eat more fruits and vegetables 4. Maintain adequate blood pressure, blood sugar, and blood cholesterol level. Reducing the risk of stroke and cardiovascular disease also helps promoting  better memory. 5. Avoid stressful situations. Live a simple life and avoid aggravations. Organize your time and prepare for the next day in anticipation. 6. Sleep well, avoid any interruptions of sleep and avoid any distractions in the bedroom that may interfere with adequate sleep quality 7. Avoid sugar, avoid sweets as there is a strong link between excessive sugar intake, diabetes, and cognitive impairment We discussed the Mediterranean diet, which has been shown to help patients reduce the risk of progressive memory disorders and reduces cardiovascular risk. This includes eating fish, eat fruits and green leafy vegetables, nuts like almonds and hazelnuts, walnuts, and also use olive oil. Avoid fast foods and fried foods as much as possible. Avoid sweets and sugar as sugar use has been linked to worsening of memory function.  There is always a concern of gradual progression of memory problems. If this is the case, then we may need to adjust level of care according to patient needs. Support, both to the patient and caregiver, should then be put into place.

## 2020-11-02 ENCOUNTER — Encounter: Payer: Self-pay | Admitting: Internal Medicine

## 2020-11-02 NOTE — Telephone Encounter (Signed)
Husband answered phone & said pt was unavailable. Left message for pt to call of to make appt for refill of Fluoxetine.  Husband stated that he "thinks she's got that figured out" but he will give her the message.

## 2020-11-06 ENCOUNTER — Telehealth: Payer: Self-pay | Admitting: *Deleted

## 2020-11-06 NOTE — Telephone Encounter (Signed)
-----   Message from Cameron Sprang, MD sent at 11/03/2020 12:42 PM EST ----- Pls let her know bloodwork was normal, proceed with other tests as discussed. Thanks

## 2020-11-06 NOTE — Telephone Encounter (Signed)
LMOM to call us back so we may give test results.

## 2020-11-06 NOTE — Progress Notes (Signed)
Pt advised of her lab results and to keep her EEG appt 11/08/20

## 2020-11-07 ENCOUNTER — Telehealth: Payer: Self-pay | Admitting: Cardiology

## 2020-11-07 NOTE — Telephone Encounter (Signed)
Pt c/o BP issue: STAT if pt c/o blurred vision, one-sided weakness or slurred speech  1. What are your last 5 BP readings? 165/95 HR 73, 122/71 HR 63, usually ranges 130-140/ something, in November 210/150  2. Are you having any other symptoms (ex. Dizziness, headache, blurred vision, passed out)? no  3. What is your BP issue? Patient states she has not been checking her BP regularly, but during her appointment 08/29/2020 with Dr. Roxan Hockey her BP was 210/150. She states he told her to see Dr. Ellyn Hack. She states her BP has not been that high again, but is scheduled to see Dr. Ellyn Hack 12/21/2020

## 2020-11-07 NOTE — Telephone Encounter (Signed)
Spoke to patient she stated when she saw Dr.Hendrickson last week her B/P was elevated 210/80.He told her to see Dr.Harding.This morning B/P 120/74.Appointment offered with Dr.Harding for 1/12,but patient stated she already has appointment.Appointment scheduled with Coletta Memos NP at 11/16/19 at 11:15 am.

## 2020-11-08 ENCOUNTER — Ambulatory Visit: Payer: Medicare Other | Admitting: Neurology

## 2020-11-08 ENCOUNTER — Other Ambulatory Visit: Payer: Self-pay

## 2020-11-08 DIAGNOSIS — R413 Other amnesia: Secondary | ICD-10-CM

## 2020-11-08 DIAGNOSIS — R404 Transient alteration of awareness: Secondary | ICD-10-CM

## 2020-11-15 ENCOUNTER — Ambulatory Visit: Payer: Medicare Other | Admitting: General Practice

## 2020-11-15 NOTE — Procedures (Signed)
ELECTROENCEPHALOGRAM REPORT  Date of Study: 11/08/2020  Patient's Name: Marissa [name] MRN: [id number] Date of Birth: 19-Feb-1946  Referring Provider: Dr. 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   Medications: XANAX 0.25 MG tablet NORVASC 10 MG tablet SOMA 350 MG tablet CATAPRES 0.2 MG tablet Eszopiclone 3 MG TABS ZETIA 10 MG tablet GLUCERNA SHAKE LIQD PROZAC 20 MG tablet PROZAC 40 MG capsule XOPENEX HFA 45 MCG/ACT inhaler SYNTHROID 125 MCG tablet MINIVELLE 0.05 MG/24HR patch SINGULAIR 10 MG tablet MYRBETRIQ 50 MG EB58 tablet BYSTOLIC 10 MG tablet PRILOSEC 20 MG capsule   Technical Summary: A multichannel digital 1-hour EEG recording measured by the international 10-20 system with electrodes applied with paste and impedances below 5000 ohms performed in our laboratory with EKG monitoring in an awake and drowsy patient.  Hyperventilation was not performed. Photic stimulation was performed.  The digital EEG was referentially recorded, reformatted, and digitally filtered in a variety of bipolar and referential montages for optimal display.    Description: The patient is awake and drowsy during the recording.  During maximal wakefulness, there is a symmetric, medium voltage 8-9 Hz posterior dominant rhythm that attenuates with eye opening.  There is occasional focal theta and delta slowing over the left temporal region. During drowsiness, there is an increase in theta slowing of the background, more over the bilateral temporal regions. Sleep was not captured. Photic stimulation did not elicit any abnormalities.  There were no epileptiform discharges or electrographic seizures seen.    EKG lead was unremarkable.  Impression: This 1-hour awake and drowsy EEG is abnormal due to occasional focal slowing over the left temporal region.  Clinical Correlation of the above findings  indicates focal cerebral dysfunction over the left temporal region suggestive of underlying structural or physiologic abnormality. The absence of epileptiform discharges does not exclude a clinical diagnosis of epilepsy. Clinical correlation is advised.  Ellouise Newer, M.D.

## 2020-11-21 ENCOUNTER — Ambulatory Visit (INDEPENDENT_AMBULATORY_CARE_PROVIDER_SITE_OTHER): Payer: Medicare Other | Admitting: Internal Medicine

## 2020-11-21 ENCOUNTER — Encounter: Payer: Self-pay | Admitting: Internal Medicine

## 2020-11-21 ENCOUNTER — Other Ambulatory Visit: Payer: Self-pay

## 2020-11-21 VITALS — BP 134/82 | HR 63 | Temp 97.8°F | Resp 18 | Ht 64.0 in | Wt 143.0 lb

## 2020-11-21 DIAGNOSIS — Z0000 Encounter for general adult medical examination without abnormal findings: Secondary | ICD-10-CM | POA: Diagnosis not present

## 2020-11-21 DIAGNOSIS — R0989 Other specified symptoms and signs involving the circulatory and respiratory systems: Secondary | ICD-10-CM | POA: Diagnosis not present

## 2020-11-21 DIAGNOSIS — F419 Anxiety disorder, unspecified: Secondary | ICD-10-CM | POA: Diagnosis not present

## 2020-11-21 DIAGNOSIS — D509 Iron deficiency anemia, unspecified: Secondary | ICD-10-CM | POA: Diagnosis not present

## 2020-11-21 DIAGNOSIS — G9332 Myalgic encephalomyelitis/chronic fatigue syndrome: Secondary | ICD-10-CM

## 2020-11-21 DIAGNOSIS — R5382 Chronic fatigue, unspecified: Secondary | ICD-10-CM

## 2020-11-21 LAB — COMPREHENSIVE METABOLIC PANEL
ALT: 94 U/L — ABNORMAL HIGH (ref 0–35)
AST: 87 U/L — ABNORMAL HIGH (ref 0–37)
Albumin: 4.6 g/dL (ref 3.5–5.2)
Alkaline Phosphatase: 70 U/L (ref 39–117)
BUN: 17 mg/dL (ref 6–23)
CO2: 26 mEq/L (ref 19–32)
Calcium: 8.6 mg/dL (ref 8.4–10.5)
Chloride: 98 mEq/L (ref 96–112)
Creatinine, Ser: 1.23 mg/dL — ABNORMAL HIGH (ref 0.40–1.20)
GFR: 42.86 mL/min — ABNORMAL LOW (ref >60.00)
Glucose, Bld: 101 mg/dL — ABNORMAL HIGH (ref 70–99)
Potassium: 4 mEq/L (ref 3.5–5.1)
Sodium: 133 mEq/L — ABNORMAL LOW (ref 135–145)
Total Bilirubin: 0.5 mg/dL (ref 0.2–1.2)
Total Protein: 6.9 g/dL (ref 6.0–8.3)

## 2020-11-21 LAB — CBC
HCT: 34.8 % — ABNORMAL LOW (ref 36.0–46.0)
Hemoglobin: 11.9 g/dL — ABNORMAL LOW (ref 12.0–15.0)
MCHC: 34.3 g/dL (ref 30.0–36.0)
MCV: 95.5 fl (ref 78.0–100.0)
Platelets: 178 10*3/uL (ref 150.0–400.0)
RBC: 3.64 Mil/uL — ABNORMAL LOW (ref 3.87–5.11)
RDW: 14.3 % (ref 11.5–15.5)
WBC: 6.3 10*3/uL (ref 4.0–10.5)

## 2020-11-21 LAB — LIPID PANEL
Cholesterol: 246 mg/dL — ABNORMAL HIGH (ref 0–200)
HDL: 72.5 mg/dL (ref >39.00)
NonHDL: 173.24
Total CHOL/HDL Ratio: 3
Triglycerides: 269 mg/dL — ABNORMAL HIGH (ref 0.0–149.0)
VLDL: 53.8 mg/dL — ABNORMAL HIGH (ref 0.0–40.0)

## 2020-11-21 LAB — LDL CHOLESTEROL, DIRECT: Direct LDL: 146 mg/dL

## 2020-11-21 NOTE — Patient Instructions (Addendum)
We will keep the medicines the same for today.    Health Maintenance, Female Adopting a healthy lifestyle and getting preventive care are important in promoting health and wellness. Ask your health care provider about:  The right schedule for you to have regular tests and exams.  Things you can do on your own to prevent diseases and keep yourself healthy. What should I know about diet, weight, and exercise? Eat a healthy diet  Eat a diet that includes plenty of vegetables, fruits, low-fat dairy products, and lean protein.  Do not eat a lot of foods that are high in solid fats, added sugars, or sodium.   Maintain a healthy weight Body mass index (BMI) is used to identify weight problems. It estimates body fat based on height and weight. Your health care provider can help determine your BMI and help you achieve or maintain a healthy weight. Get regular exercise Get regular exercise. This is one of the most important things you can do for your health. Most adults should:  Exercise for at least 150 minutes each week. The exercise should increase your heart rate and make you sweat (moderate-intensity exercise).  Do strengthening exercises at least twice a week. This is in addition to the moderate-intensity exercise.  Spend less time sitting. Even light physical activity can be beneficial. Watch cholesterol and blood lipids Have your blood tested for lipids and cholesterol at 76 years of age, then have this test every 5 years. Have your cholesterol levels checked more often if:  Your lipid or cholesterol levels are high.  You are older than 76 years of age.  You are at high risk for heart disease. What should I know about cancer screening? Depending on your health history and family history, you may need to have cancer screening at various ages. This may include screening for:  Breast cancer.  Cervical cancer.  Colorectal cancer.  Skin cancer.  Lung cancer. What should I know  about heart disease, diabetes, and high blood pressure? Blood pressure and heart disease  High blood pressure causes heart disease and increases the risk of stroke. This is more likely to develop in people who have high blood pressure readings, are of African descent, or are overweight.  Have your blood pressure checked: ? Every 3-5 years if you are 76-76 years of age. ? Every year if you are 76 years old or older. Diabetes Have regular diabetes screenings. This checks your fasting blood sugar level. Have the screening done:  Once every three years after age 76 if you are at a normal weight and have a low risk for diabetes.  More often and at a younger age if you are overweight or have a high risk for diabetes. What should I know about preventing infection? Hepatitis B If you have a higher risk for hepatitis B, you should be screened for this virus. Talk with your health care provider to find out if you are at risk for hepatitis B infection. Hepatitis C Testing is recommended for:  Everyone born from [birth year] through 1966.  Anyone with known risk factors for hepatitis C. Sexually transmitted infections (STIs)  Get screened for STIs, including gonorrhea and chlamydia, if: ? You are sexually active and are younger than 76 years of age. ? You are older than 76 years of age and your health care provider tells you that you are at risk for this type of infection. ? Your sexual activity has changed since you were last screened, and you are  at increased risk for chlamydia or gonorrhea. Ask your health care provider if you are at risk.  Ask your health care provider about whether you are at high risk for HIV. Your health care provider may recommend a prescription medicine to help prevent HIV infection. If you choose to take medicine to prevent HIV, you should first get tested for HIV. You should then be tested every 3 months for as long as you are taking the medicine. Pregnancy  If you are about  to stop having your period (premenopausal) and you may become pregnant, seek counseling before you get pregnant.  Take 400 to 800 micrograms (mcg) of folic acid every day if you become pregnant.  Ask for birth control (contraception) if you want to prevent pregnancy. Osteoporosis and menopause Osteoporosis is a disease in which the bones lose minerals and strength with aging. This can result in bone fractures. If you are 32 years old or older, or if you are at risk for osteoporosis and fractures, ask your health care provider if you should:  Be screened for bone loss.  Take a calcium or vitamin D supplement to lower your risk of fractures.  Be given hormone replacement therapy (HRT) to treat symptoms of menopause. Follow these instructions at home: Lifestyle  Do not use any products that contain nicotine or tobacco, such as cigarettes, e-cigarettes, and chewing tobacco. If you need help quitting, ask your health care provider.  Do not use street drugs.  Do not share needles.  Ask your health care provider for help if you need support or information about quitting drugs. Alcohol use  Do not drink alcohol if: ? Your health care provider tells you not to drink. ? You are pregnant, may be pregnant, or are planning to become pregnant.  If you drink alcohol: ? Limit how much you use to 0-1 drink a day. ? Limit intake if you are breastfeeding.  Be aware of how much alcohol is in your drink. In the [place] one drink equals one 12 oz bottle of beer (355 mL), one 5 oz glass of wine (148 mL), or one 1 oz glass of hard liquor (44 mL). General instructions  Schedule regular health, dental, and eye exams.  Stay current with your vaccines.  Tell your health care provider if: ? You often feel depressed. ? You have ever been abused or do not feel safe at home. Summary  Adopting a healthy lifestyle and getting preventive care are important in promoting health and wellness.  Follow your  health care provider's instructions about healthy diet, exercising, and getting tested or screened for diseases.  Follow your health care provider's instructions on monitoring your cholesterol and blood pressure. This information is not intended to replace advice given to you by your health care provider. Make sure you discuss any questions you have with your health care provider. Document Revised: 10/07/2018 Document Reviewed: 10/07/2018 Elsevier Patient Education  2021 [employer].

## 2020-11-21 NOTE — Progress Notes (Signed)
° °  Subjective:   Patient ID: Marissa [name], female    DOB: [birth year], 75 y.o.   MRN: [id number]  HPI The patient is a 75 YO female coming in for physical.  PMH, [place], social history reviewed and updated  Review of Systems  Constitutional: Positive for activity change and fatigue.  HENT: Negative.   Eyes: Negative.   Respiratory: Negative for cough, chest tightness and shortness of breath.   Cardiovascular: Negative for chest pain, palpitations and leg swelling.  Gastrointestinal: Negative for abdominal distention, abdominal pain, constipation, diarrhea, nausea and vomiting.  Musculoskeletal: Positive for arthralgias and myalgias.  Skin: Negative.   Neurological: Negative.   Psychiatric/Behavioral: Negative.     Objective:  Physical Exam Constitutional:      Appearance: She is well-developed and well-nourished.  HENT:     Head: Normocephalic and atraumatic.  Eyes:     Extraocular Movements: EOM normal.  Cardiovascular:     Rate and Rhythm: Normal rate and regular rhythm.  Pulmonary:     Effort: Pulmonary effort is normal. No respiratory distress.     Breath sounds: Normal breath sounds. No wheezing or rales.  Abdominal:     General: Bowel sounds are normal. There is no distension.     Palpations: Abdomen is soft.     Tenderness: There is no abdominal tenderness. There is no rebound.  Musculoskeletal:        General: Tenderness present. No edema.     Cervical back: Normal range of motion.     Comments: Diffuse tenderness  Skin:    General: Skin is warm and dry.  Neurological:     Mental Status: She is alert and oriented to person, place, and time.     Coordination: Coordination normal.  Psychiatric:        Mood and Affect: Mood and affect normal.     Vitals:   11/21/20 1355  BP: 134/82  Pulse: 63  Resp: 18  Temp: 97.8 F (36.6 C)  TempSrc: Oral  SpO2: 99%  Weight: 143 lb (64.9 kg)  Height: 5' 4"  (1.626 m)    This visit occurred during the SARS-CoV-2  public health emergency.  Safety protocols were in place, including screening questions prior to the visit, additional usage of staff PPE, and extensive cleaning of exam room while observing appropriate contact time as indicated for disinfecting solutions.   Assessment & Plan:

## 2020-11-24 NOTE — Assessment & Plan Note (Signed)
Checking CBC.  °

## 2020-11-24 NOTE — Assessment & Plan Note (Signed)
Overall worse but does not want change in therapy at this time.

## 2020-11-24 NOTE — Assessment & Plan Note (Signed)
Flu shot up to date. Covid-19 2 shots booster encouraged. Pneumonia complete. Shingrix counseled. Tetanus due 2022. Colonoscopy due 2026 depending on overall health. Mammogram due 2023, pap smear aged out and dexa declines further. Counseled about sun safety and mole surveillance. Counseled about the dangers of distracted driving. Given 10 year screening recommendations.

## 2020-11-24 NOTE — Assessment & Plan Note (Signed)
Checking CMP and BP at goal at visit. Continue amlodipine and clonidine and bystolic.

## 2020-11-24 NOTE — Assessment & Plan Note (Signed)
Worse after the holidays and she is having more struggles with this.

## 2020-11-26 ENCOUNTER — Other Ambulatory Visit: Payer: Self-pay | Admitting: Internal Medicine

## 2020-11-27 ENCOUNTER — Other Ambulatory Visit: Payer: Self-pay | Admitting: Internal Medicine

## 2020-11-27 DIAGNOSIS — R748 Abnormal levels of other serum enzymes: Secondary | ICD-10-CM

## 2020-12-06 DIAGNOSIS — N958 Other specified menopausal and perimenopausal disorders: Secondary | ICD-10-CM | POA: Diagnosis not present

## 2020-12-06 DIAGNOSIS — Z01419 Encounter for gynecological examination (general) (routine) without abnormal findings: Secondary | ICD-10-CM | POA: Diagnosis not present

## 2020-12-06 DIAGNOSIS — Z6824 Body mass index (BMI) 24.0-24.9, adult: Secondary | ICD-10-CM | POA: Diagnosis not present

## 2020-12-08 ENCOUNTER — Ambulatory Visit
Admission: RE | Admit: 2020-12-08 | Discharge: 2020-12-08 | Disposition: A | Discharge status: Home / Self Care | Payer: Medicare Other | Source: Ambulatory Visit | Attending: Neurology | Admitting: Neurology

## 2020-12-08 ENCOUNTER — Other Ambulatory Visit: Payer: Self-pay

## 2020-12-08 DIAGNOSIS — R413 Other amnesia: Secondary | ICD-10-CM | POA: Diagnosis not present

## 2020-12-08 DIAGNOSIS — I6782 Cerebral ischemia: Secondary | ICD-10-CM | POA: Diagnosis not present

## 2020-12-08 DIAGNOSIS — R404 Transient alteration of awareness: Secondary | ICD-10-CM

## 2020-12-08 MED ORDER — GADOBENATE DIMEGLUMINE 529 MG/ML IV SOLN
14.0000 mL | Freq: Once | INTRAVENOUS | Status: AC | PRN
  Administered 2020-12-08: 14 mL via INTRAVENOUS

## 2020-12-09 ENCOUNTER — Other Ambulatory Visit: Payer: Self-pay | Admitting: Internal Medicine

## 2020-12-11 NOTE — Telephone Encounter (Signed)
LR: 05-29-2020 Qty: 30 with 4 refills Last office visit: 11-21-2020 Upcoming appointment: No pending appointment

## 2020-12-21 ENCOUNTER — Other Ambulatory Visit: Payer: Self-pay

## 2020-12-21 ENCOUNTER — Ambulatory Visit: Payer: Medicare Other | Admitting: Cardiology

## 2020-12-21 ENCOUNTER — Encounter: Payer: Self-pay | Admitting: Cardiology

## 2020-12-21 DIAGNOSIS — I77810 Thoracic aortic ectasia: Secondary | ICD-10-CM

## 2020-12-21 DIAGNOSIS — E785 Hyperlipidemia, unspecified: Secondary | ICD-10-CM

## 2020-12-21 DIAGNOSIS — R0989 Other specified symptoms and signs involving the circulatory and respiratory systems: Secondary | ICD-10-CM | POA: Diagnosis not present

## 2020-12-21 DIAGNOSIS — I350 Nonrheumatic aortic (valve) stenosis: Secondary | ICD-10-CM | POA: Diagnosis not present

## 2020-12-21 DIAGNOSIS — R5382 Chronic fatigue, unspecified: Secondary | ICD-10-CM

## 2020-12-21 DIAGNOSIS — I10 Essential (primary) hypertension: Secondary | ICD-10-CM | POA: Diagnosis not present

## 2020-12-21 DIAGNOSIS — G9332 Myalgic encephalomyelitis/chronic fatigue syndrome: Secondary | ICD-10-CM

## 2020-12-21 MED ORDER — NEXLETOL 180 MG PO TABS: 180.0000 mg | ORAL_TABLET | Freq: Every day | ORAL | 6 refills | Status: DC

## 2020-12-21 MED ORDER — CLONIDINE HCL 0.2 MG PO TABS: 0.1000 mg | ORAL_TABLET | Freq: Two times a day (BID) | ORAL | 1 refills | Status: DC

## 2020-12-21 NOTE — Patient Instructions (Addendum)
Medication Instructions:   retry taking Nexletol 180 mg   a day - for the first month take one tablet  Every other daily then increase to one  a daily .    may use Clonidine  If you have a sustain blood pressure of 150 or greater for one hour   *If you need a refill on your cardiac medications before your next appointment, please call your pharmacy*   Lab Work:  lipids in 4 months - fasting     If you have labs (blood work) drawn today and your tests are completely normal, you will receive your results only by: [name] Kitchen MyChart Message (if you have MyChart) OR . A paper copy in the mail If you have any lab test that is abnormal or we need to change your treatment, we will call you to review the results.   Testing/Procedures: Not needed    Follow-Up: At [facility], you and your health needs are our priority.  As part of our continuing mission to provide you with exceptional heart care, we have created designated Provider Care Teams.  These Care Teams include your primary Cardiologist (physician) and Advanced Practice Providers (APPs -  Physician Assistants and Nurse Practitioners) who all work together to provide you with the care you need, when you need it.     Your next appointment:   6 month(s)  The format for your next appointment:   In Person  Provider:   [name], MD

## 2020-12-21 NOTE — Progress Notes (Signed)
Primary Care Provider: [name], MD Cardiologist: [name], MD Electrophysiologist: None  Clinic Note: Chief Complaint  Patient presents with  . Follow-up    Annual visit-  . Dizziness  . Hypertension    Somewhat labile.  Was started back on clonidine by Dr. Koleen Nimrod  . Cardiac Valve Problem    Mild aortic stenosis along with mild ascending aortic dilation.   ===================================  ASSESSMENT/PLAN   Problem List Items Addressed This Visit    Chronic fatigue syndrome    I wonder if some of her fatigue is because of this.  Certainly not made any better with clonidine. Wonder she has a sleep disorder as well.      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 Roxan Hockey.  Due for MRA of the chest next year for follow-up.  Has been stable.  We do want to control blood pressure, but unfortunately in this area there is the expensive side effects.  We will also try to treat her cholesterol which again is at the risk of triggering side effects as well.      Relevant Medications   cloNIDine (CATAPRES) 0.2 MG tablet   Labile hypertension (Chronic)    She is too fatigued with clonidine.  Unfortunately were just really stuck her quality of life is not good with clonidine as I think it is better to just use it as needed for systolic pressures that are greater than 150 mmHg.  Otherwise will stay with current dose of Bystolic and amlodipine.  Short of using doxazosin, restarted run out of options.  She is very difficult to manage, just met anything we try it will lead to symptoms.      Relevant Medications   cloNIDine (CATAPRES) 0.2 MG tablet   Mild aortic stenosis (Chronic)    Recommendations for mild aortic stenosis follow-up on echo would be every 2-3 years.  Was just checked in October.  Since it was stable I think we can wait at least 2 if not 3 years.      Relevant Medications   cloNIDine (CATAPRES) 0.2 MG tablet    Hyperlipidemia with target LDL less than 100 (Chronic)    I am not really sure what the symptoms she had when she tried Nexletol was.  I think her lipids from January are just not in control.  She had issues with PCSK9 inhibitors-concerned about how to give herself shots.  She is on Zetia, intolerant of any statin retried.  Bhagat the only option we have now is to try to restart Nexletol.  Review to see if she can tolerate it and recheck labs in about 4 months.      Relevant Medications   cloNIDine (CATAPRES) 0.2 MG tablet   Other Relevant Orders   EKG 12-Lead (Completed)   Lipid panel    Other Visit Diagnoses    Essential hypertension       Relevant Medications   cloNIDine (CATAPRES) 0.2 MG tablet   Other Relevant Orders   EKG 12-Lead (Completed)      ===================================  HPI:    Marissa [name] is a 75 y.o. female with a PMH notable for Labile Hypertension, Palpitations,Mild AS and Mild Ascending Aortic Dilation who presents today for annual follow-up  Intolerant to many different antihypertensives including thiazides and loop diuretics as well as spironolactone.  Also intolerant of statins was prescribed, but did not start Nexletol.  She was concerned about potential side effects  of ARB.  Marissa [name] was last seen on January 07, 2020-she was doing relatively well.  Blood pressures are pretty well controlled (unfortunately at that visit she had been out of amlodipine for 2 days.  Minimal palpitations on Bystolic.  She had cut out almost all of her alcohol level and was noticing less issues with lightheadedness and dizziness.  Maintained on amlodipine and Bystolic.  Plan was to start Zetia.  Apparently she did not tolerate Nexletol.    Because of hypertension, was started back on clonidine BID by Dr. Roxan Hockey.    Dr. Koleen Nimrod saw her on August 01, 2020 -> he was complaining of headaches, fatigue and memory issues with occasional chest discomfort.  She  mentioned her home blood pressures were usually in the 140 mmHg range, but as high as 180.  None less than 140.  Was started on clonidine 0.2 mg twice daily, 2D echo ordered. => Was seen in 3 weeks to discuss results of echo showing only stable mild aortic stenosis.-Plan was to follow-up MR angio in 1 year.  Recent Hospitalizations: NONE  Reviewed  CV studies:    The following studies were reviewed today: (if available, images/films reviewed: From Epic Chart or Care Everywhere) . Echo 08/15/2020: EF 60 to 65%.  No R WMA.  GRII DD.  Mild Aortic Stenosis (mean gradient 10 mmHg).  Moderate to severe aortic root dilation (45 mm) normal IVC.  (Stable compared to February 2020)  . MRA Chest 07/12/2020: Unchanged diameter ascending aorta-4.3 cm.  Subtle regions of abnormal airspace signal within bilateral lungs potential multifocal infection versus chronic scarring and fibrosis.   Interval History:   Marissa [name] returns today noting that she is somewhat tired of blood blood pressure issues.  Her blood pressure go up and down disease usually feels dizzy.  This morning her blood pressure 135/60, she feel he feels tired and worn out with clonidine as a standing medication. She is not really having any chest pain or pressure she just feels mild aches and pains here and there.  No exertional dyspnea unless she overdoes it.  No PND orthopnea.  No edema.  CV Review of Symptoms (Summary): positive for - dyspnea on exertion and Dizziness, fatigue negative for - chest pain, edema, irregular heartbeat, orthopnea, palpitations, paroxysmal nocturnal dyspnea, rapid heart rate, shortness of breath or Syncope or near syncope TIA/amaurosis fugax, claudication   The patient does not have symptoms concerning for COVID-19 infection (fever, chills, cough, or new shortness of breath).   REVIEWED OF SYSTEMS   Review of Systems  Constitutional: Positive for malaise/fatigue (Especially since starting clonidine standing  dose). Negative for weight loss.  HENT: Negative for congestion.   Respiratory: Negative for shortness of breath.   Gastrointestinal: Negative for blood in stool and melena.  Genitourinary: Negative for hematuria.  Musculoskeletal: Positive for back pain and joint pain (Right shoulder). Negative for falls.  Neurological: Positive for dizziness. Negative for focal weakness and weakness.  Psychiatric/Behavioral: Negative for depression and memory loss. The patient is nervous/anxious. The patient does not have insomnia.    Right shoulder pain, headache   I have reviewed and (if needed) personally updated the patient's problem list, medications, allergies, past medical and surgical history, social and family history.   PAST MEDICAL HISTORY   Past Medical History:  Diagnosis Date  . Arthritis   . Asthma   . Bicuspid aortic valve 05/2017   Likely functional bicuspid aortic valve with sclerosis and no stenosis.  [name] Kitchen  Bursitis   . Cataract    mild  . Dysthymia   . Hemorrhoids   . Hx of ulcerative colitis    per dr Arnoldo Morale as per pt.  . Hyperlipidemia   . Hypertension    managed - labile.  . Migraine   . Scoliosis   . 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 Spinal stenosis   . Thoracic aortic aneurysm (HCC)    Stable 4.2-4.3 cm (followed by Dr. Roxan Hockey)  . Thyroid disease     PAST SURGICAL HISTORY   Past Surgical History:  Procedure Laterality Date  . ABDOMINAL HYSTERECTOMY    . BREAST EXCISIONAL BIOPSY Left   . BUNIONECTOMY    . Cardiac Event Monitor  07/2017   Overall relatively normal.  Normal sinus rhythm with rare bradycardia and tachycardia.  Heart rate ranged from 55-110 bpm.  Occasional PACs and PVCs, every single 1 was felt.  No arrhythmias other than one short 4 beat run of PACs.  . COLONOSCOPY  02-04-2005   all normal   . FOOT SURGERY     3  pins in toes   . HAND SURGERY     left thumb joint resection  . nasal revision    . NM MYOVIEW LTD  07/2017   LOW RISK study.  No ischemia or infarction.  EF greater than 65%.   [name] Kitchen SINUS IRRIGATION    . TONSILLECTOMY AND ADENOIDECTOMY    . TRANSTHORACIC ECHOCARDIOGRAM  06/'1, 8/'19   a) Moderate LVH. Normal EF 60-65%. Normal diastolic parameters. --> Difficult to fully visualize the aortic valve. Cannot exclude bicuspid valve. Mild aortic stenosis noted. No PFO. Mildly dilated left atrium. Trivial MR. No comment on mitral valve prolapse. Moderately dilated ascending aorta.;; b) F/u Echo To evaluate the aortic valve. -- Bicuspid AoV - mildly thickened / calcified. - No stenosis  . TRANSTHORACIC ECHOCARDIOGRAM  11/2018   Normal LV size and function.  EF 60-65%.  GR 1 DD.  Normal RV size and function.  Moderate aortic valve calcification with mild Aortic Stenosis.  Normal LV size and function.  EF 60-65%.  GR 1 DD.  Normal RV size and function.  Moderate aortic calcification with mild stenosis.  Mild dilation of ascending aorta ~4.2 mm  . TRANSTHORACIC ECHOCARDIOGRAM  08/15/2020   EF 60 to 65%.  No R WMA.  GRII DD.  Mild Aortic Stenosis (mean gradient 10 mmHg).  Moderate to severe aortic root dilation (45 mm) normal IVC.  (Stable compared to February 2020)     Immunization History  Administered Date(s) Administered  . Fluad Quad(high Dose 65+) 07/14/2020  . Hep A / Hep B 02/24/2018, 06/25/2018  . Hepatitis B, adult 03/26/2018  . Influenza Split 09/12/2011, 08/19/2012  . Influenza Whole 08/20/2007, 07/29/2008, 08/07/2009, 07/25/2010  . Influenza, High Dose Seasonal PF 07/09/2016, 07/14/2017, 07/24/2018  . Influenza,inj,Quad PF,6+ Mos 07/19/2013, 08/16/2014, 07/13/2015  . Influenza-Unspecified 07/15/2019  . PFIZER(Purple Top)SARS-COV-2 Vaccination 11/17/2019, 12/08/2019  . Pneumococcal Conjugate-13 01/06/2015  . Pneumococcal Polysaccharide-23 07/19/2013  . Tdap 07/29/2011  . Zoster 10/28/2012     MEDICATIONS/ALLERGIES   Current Meds  Medication Sig  . ALPRAZolam (XANAX) 0.25 MG tablet TAKE 1 TABLET(0.25 MG) BY MOUTH DAILY AS NEEDED FOR ANXIETY  . carisoprodol (SOMA) 350 MG tablet Take 1 tablet (350 mg total) by mouth 3 (three) times daily as needed  for muscle spasms.  [name] Kitchen ezetimibe (ZETIA) 10 MG tablet TAKE 1 TABLET(10 MG) BY MOUTH DAILY  . famotidine (PEPCID) 20 MG tablet Take 20 mg by mouth as needed.   . feeding supplement, GLUCERNA SHAKE, (GLUCERNA SHAKE) LIQD Take 237 mLs by mouth daily.  [name] Kitchen FLUoxetine (PROZAC) 40 MG capsule TAKE 1 CAPSULE(40 MG) BY MOUTH TWICE DAILY  . levalbuterol (XOPENEX HFA) 45 MCG/ACT inhaler Inhale 1-2 puffs into the lungs every 8 (eight) hours as needed for wheezing. (Patient taking differently: Inhale 1-2 puffs into the lungs every 8 (eight) hours as needed for wheezing. Patient takes 60 MG)  . levothyroxine (SYNTHROID) 125 MCG tablet TAKE 1 TABLET(125 MCG) BY MOUTH DAILY  . MINIVELLE 0.05 MG/24HR patch Place 1 patch onto the skin 2 (two) times a week.   . montelukast (SINGULAIR) 10 MG tablet TAKE 1 TABLET(10 MG) BY MOUTH AT BEDTIME  . MYRBETRIQ 50 MG TB24 tablet TAKE 1 TABLET(50 MG) BY MOUTH DAILY  . nebivolol (BYSTOLIC) 10 MG tablet TAKE 1 TABLET(10 MG) BY MOUTH DAILY AT 8 PM  . omeprazole (PRILOSEC) 20 MG capsule Take 20 mg by mouth daily as needed.  [name] Kitchen Respiratory Therapy Supplies (FLUTTER) DEVI Use as directed  . [DISCONTINUED] amLODipine (NORVASC) 10 MG tablet Take 1 tablet (10 mg total) by mouth daily.  . [DISCONTINUED] Bempedoic Acid (NEXLETOL) 180 MG TABS Take 180 mg by mouth daily.  . [DISCONTINUED] cloNIDine (CATAPRES) 0.2 MG tablet Take 0.5 tablets (0.1 mg total) by mouth 2 (two) times daily.  . [DISCONTINUED] Eszopiclone 3 MG TABS TAKE 1 TABLET BY MOUTH IMMEDIATELY BEFORE BEDTIME    Allergies  Allergen Reactions  . Fentanyl Shortness Of Breath and Rash  . Rosuvastatin Anaphylaxis, Anxiety, Hives, Itching, Nausea And Vomiting, Other (See  Comments), Palpitations, Photosensitivity and Shortness Of Breath  . Amoxicillin Diarrhea    Severe diarrhea, rash to vaginal area with swelling   . Hctz [Hydrochlorothiazide] Other (See Comments)    HypoNatremia  . Hydralazine     Weak, sweats, red skin  . Codeine Other (See Comments)    makes her hyper  . Conjugated Estrogens Itching and Rash  . Erythromycin Rash    had a rash with emycin, has done ok with other meds in it's class  . Piroxicam Itching and Rash    Feldene    SOCIAL HISTORY/FAMILY HISTORY   Reviewed in Epic:  Pertinent findings:  Social History   Tobacco Use  . Smoking status: Never Smoker  . Smokeless tobacco: Never Used  Vaping Use  . Vaping Use: Never used  Substance Use Topics  . Alcohol use: Yes    Alcohol/week: 0.0 standard drinks    Comment: occ   . Drug use: No   Social History   Social History Narrative   Right handed    Lives with husband     OBJCTIVE -PE, EKG, labs   Wt Readings from Last 3 Encounters:  12/21/20 141 lb (64 kg)  11/21/20 143 lb (64.9 kg)  11/01/20 142 lb 9.6 oz (64.7 kg)    Physical Exam: BP 140/76   Pulse 60   Ht 5' 4"  (1.626 m)   Wt 141 lb (64 kg)   BMI 24.20 kg/m  Physical Exam Constitutional:      General: She is not in acute distress.    Appearance: Normal appearance. She is normal weight. She is not ill-appearing or toxic-appearing.     Comments: Well-nourished, well-groomed  HENT:     Head: Normocephalic and  atraumatic.  Neck:     Vascular: No carotid bruit (Radiated aortic murmur).  Cardiovascular:     Rate and Rhythm: Normal rate and regular rhythm.     Pulses: Normal pulses.     Heart sounds: Murmur (2/6C-D SEM at RUSB--> neck) heard.  No friction rub. No gallop.   Pulmonary:     Effort: Pulmonary effort is normal. No respiratory distress.     Breath sounds: Normal breath sounds.  Chest:     Chest wall: No tenderness.  Musculoskeletal:        General: No swelling. Normal range of motion.      Cervical back: Normal range of motion and neck supple.  Skin:    General: Skin is warm and dry.  Neurological:     General: No focal deficit present.     Mental Status: She is alert and oriented to person, place, and time.  Psychiatric:        Mood and Affect: Mood normal.        Behavior: Behavior normal.        Thought Content: Thought content normal.        Judgment: Judgment normal.     Comments: Anxious      Adult ECG Report  Rate: 60 ;  Rhythm: normal sinus rhythm, premature atrial contractions (PAC) and Cannot exclude septal infarct, age-indeterminate.  Nonspecific ST-T wave changes.;   Narrative Interpretation: Stable EKG  Recent Labs: Reviewed Lab Results  Component Value Date   CHOL 246 (H) 11/21/2020   HDL 72.50 11/21/2020   LDLCALC 100 (H) 01/12/2020   LDLDIRECT 146.0 11/21/2020   TRIG 269.0 (H) 11/21/2020   CHOLHDL 3 11/21/2020   Lab Results  Component Value Date   CREATININE 1.23 (H) 11/21/2020   BUN 17 11/21/2020   NA 133 (L) 11/21/2020   K 4.0 11/21/2020   CL 98 11/21/2020   CO2 26 11/21/2020   CBC Latest Ref Rng & Units 11/21/2020 01/12/2020 12/31/2018  WBC 4.0 - 10.5 K/uL 6.3 4.3 5.0  Hemoglobin 12.0 - 15.0 g/dL 11.9(L) 12.6 10.7(L)  Hematocrit 36.0 - 46.0 % 34.8(L) 35.7 30.0(L)  Platelets 150.0 - 400.0 K/uL 178.0 267 341.0    Lab Results  Component Value Date   TSH 1.51 11/01/2020    ==================================================  COVID-19 Education: The signs and symptoms of COVID-19 were discussed with the patient and how to seek care for testing (follow up with PCP or arrange E-visit).   The importance of social distancing and COVID-19 vaccination was discussed today. The patient is practicing social distancing & Masking.   I spent a total of 75mnutes with the patient spent in direct patient consultation.  Additional time spent with chart review  / charting (studies, outside notes, etc): 12 min Total Time: 47 min   Current  medicines are reviewed at length with the patient today.  (+/- concerns) N/A  This visit occurred during the SARS-CoV-2 public health emergency.  Safety protocols were in place, including screening questions prior to the visit, additional usage of staff PPE, and extensive cleaning of exam room while observing appropriate contact time as indicated for disinfecting solutions.  Notice: This dictation was prepared with Dragon dictation along with smaller phrase technology. Any transcriptional errors that result from this process are unintentional and may not be corrected upon review.  Patient Instructions / Medication Changes & Studies & Tests Ordered   Patient Instructions  Medication Instructions:   retry taking Nexletol 180 mg   a day -  for the first month take one tablet  Every other daily then increase to one  a daily .    may use Clonidine  If you have a sustain blood pressure of 150 or greater for one hour   *If you need a refill on your cardiac medications before your next appointment, please call your pharmacy*   Lab Work:  lipids in 4 months - fasting     If you have labs (blood work) drawn today and your tests are completely normal, you will receive your results only by: [name] Kitchen MyChart Message (if you have MyChart) OR . A paper copy in the mail If you have any lab test that is abnormal or we need to change your treatment, we will call you to review the results.   Testing/Procedures: Not needed    Follow-Up: At [facility], you and your health needs are our priority.  As part of our continuing mission to provide you with exceptional heart care, we have created designated Provider Care Teams.  These Care Teams include your primary Cardiologist (physician) and Advanced Practice Providers (APPs -  Physician Assistants and Nurse Practitioners) who all work together to provide you with the care you need, when you need it.     Your next appointment:   6 month(s)  The format  for your next appointment:   In Person  Provider:   [name], MD    Studies Ordered:   Orders Placed This Encounter  Procedures  . Lipid panel  . EKG 12-Lead     [name], M.D., M.S. Interventional Cardiologist   Pager # [phone] Phone # [phone] [address] [place], [place] [address]   Thank you for choosing [facility] at [facility]!!

## 2020-12-22 ENCOUNTER — Other Ambulatory Visit: Payer: Self-pay | Admitting: Pharmacist Clinician (PhC)/ Clinical Pharmacy Specialist

## 2020-12-22 MED ORDER — NEXLETOL 180 MG PO TABS: 180.0000 mg | ORAL_TABLET | Freq: Every day | ORAL | 3 refills | Status: DC

## 2020-12-25 ENCOUNTER — Other Ambulatory Visit: Payer: Self-pay | Admitting: Internal Medicine

## 2020-12-25 ENCOUNTER — Telehealth: Payer: Self-pay

## 2020-12-25 NOTE — Telephone Encounter (Signed)
Pa for nexletol sent to plan  [facility] Zadie Rhine Key: [id number]

## 2020-12-26 ENCOUNTER — Other Ambulatory Visit: Payer: Self-pay | Admitting: Cardiology

## 2020-12-26 ENCOUNTER — Encounter: Payer: Self-pay | Admitting: Internal Medicine

## 2020-12-27 ENCOUNTER — Ambulatory Visit: Payer: Medicare Other | Admitting: Psychology

## 2020-12-27 ENCOUNTER — Other Ambulatory Visit: Payer: Self-pay

## 2020-12-27 ENCOUNTER — Ambulatory Visit (INDEPENDENT_AMBULATORY_CARE_PROVIDER_SITE_OTHER): Payer: Medicare Other | Admitting: Counselor

## 2020-12-27 ENCOUNTER — Encounter: Payer: Self-pay | Admitting: Counselor

## 2020-12-27 DIAGNOSIS — G3184 Mild cognitive impairment, so stated: Secondary | ICD-10-CM

## 2020-12-27 DIAGNOSIS — G319 Degenerative disease of nervous system, unspecified: Secondary | ICD-10-CM

## 2020-12-27 DIAGNOSIS — R5383 Other fatigue: Secondary | ICD-10-CM

## 2020-12-27 DIAGNOSIS — F09 Unspecified mental disorder due to known physiological condition: Secondary | ICD-10-CM

## 2020-12-27 NOTE — Progress Notes (Signed)
   Psychometrist Note   Cognitive testing was administered to [place] Jha by Milana Kidney, B.S. (Technician) under the supervision of Alphonzo Severance, Psy.D., ABN. Ms. Vensel was able to tolerate all test procedures. Dr. Nicole Kindred met with the patient as needed to manage any emotional reactions to the testing procedures. Rest breaks were offered.    The battery of tests administered was selected by Dr. Nicole Kindred with consideration to the patient's current level of functioning, the nature of her symptoms, emotional and behavioral responses during the interview, level of literacy, observed level of motivation/effort, and the nature of the referral question. This battery was communicated to the psychometrist. Communication between Dr. Nicole Kindred and the psychometrist was ongoing throughout the evaluation and Dr. Nicole Kindred was immediately accessible at all times. Dr. Nicole Kindred provided supervision to the technician on the date of this service, to the extent necessary to assure the quality of all services provided.    Ms. Minassian will return in approximately one week for an interactive feedback session with Dr. Nicole Kindred, at which time test performance, clinical impressions, and treatment recommendations will be reviewed in detail. The patient understands she can contact our office should she require our assistance before this time.   A total of 115 minutes of billable time were spent with Oval Linsey Camberos by the technician, including test administration and scoring time. Billing for these services is reflected in Dr. Les Pou note.   This note reflects time spent with the psychometrician and does not include test scores, clinical history, or any interpretations made by Dr. Nicole Kindred. The full report will follow in a separate note.

## 2020-12-27 NOTE — Progress Notes (Signed)
[facility]  Patient Name: Patryce Depriest MRN: [id number] Date of Birth: 21-Feb-1946 Age: 75 y.o. Education: 15  Referral Circumstances and Background Information  Ms. Tinleigh Whitmire is a 75 y.o., right-hand dominant, married woman with a history of hypertension, hyperlipidemia, migraines, chronic fatigue immune dysfunction, and memory loss. She was seen recently by Dr. Delice Lesch, who noted word finding problems, difficulties remembering what things are, and some functional decline. Her husband took over the finances about 11 months ago, although she says that is because he is retired and he is bored. She was referred for neuropsychological evaluation in the service of diagnostic clarity and staging.   On interview, the patient reported that the first changes were noticed 2-3 years ago and started with language problems. Her husband noticed changes about a year ago maybe longer. She thinks that her issues have been worsening over time. She had a hard time describing what specific language problem she is having, although it sounds like she will be about to say something and then her mind "goes blank." There is a naming component but her difficulties seem to go beyond a simple word finding problem. Her husband and the patient have noticed her having problems pronouncing words at times, they will sound slurred. Sometimes, she will also be "stuttery." They haven't noticed any difficulties with comprehension or with understanding the meaning of a single word. They do not notice any slow effortful speech. There is quite a bit of variability and sometimes, she will have good days where she feels near normal. With respect to memory, she has a hard time. They reported that she keeps a very detailed calendar, she cannot track appointments, and she does repeat herself. With respect to behavior and personality changes, her husband said that she is irritable, although they do have  some conflict that may be explanatory, and it has gotten better since he addressed it with her suggesting she has some control of her behavior. She was candid about the fact that he gets on her nerves. They are denying any odd or inappropriate behaviors, there is no profound apathy, there is no hoarding or collecting, and there are no compulsive behaviors. The patient says that her husband does things that can be "nerve wracking" to her, he talks a lot, has a strong personality, and gets into politics, which aggravates her. With respect to mood, she feels like her nerves are "bad." She feels somewhat anxious and on edge most of the time. She reported that sometimes, "I can't hold back the tears," although this sounds as though it is mostly related to her husband. Her energy is not good, she has chronic fatigue immune dysfunction and that is still symptomatic. Her sleep is not good, "I don't sleep unless I have a sleeping pill," and that is since she went through menopause. She also has a number of physical issues and pain that keep her up. She gets about 7 hours of sleep with the medication. She reported that her pain is typically around a 6 or an 8 out of 10. With respect to appetite, she has been "eating too much," she said she has gained about 12lbs this year.   In terms of movement symptoms, the patient has some subjective balance difficulties as of late. She has had several falls, about 3 or 4 over the past year, although a number of them are mechanical falls or tripping on things. She feels tremulous in her hands, over the  past 3 years. She sometimes moves very slowly, although usually she does so intentionally, because she feels off balance. She typically walks using a single point cane or a walker. Tone was normal on exam with Dr. Delice Lesch, gait was slow and cautious due to knee pain.   With respect to functioning, the patient reported that she was having some decline with finances, although she thinks she  is still able to do them. She feels like she was getting to the point where "I would just want to cry" when she was looking at more complex things such as insurance papers. Her husband took them over simply to help out, because he retired. Her husband also now does most of the grocery shopping, she was having a hard time physically related to her health difficulties.  She will sometimes re-write checks several times before getting them correct. She is driving, intermittently, but only about twice a month. It depends on how she is feeling with her fatigue and other issues. She is still able to use her smart phone and a computer but it is not as easy as it was. She notices some incoordination in terms of her typing and that she will "hit all the wrong keys." The patient reported that she does not cook, it's like "I don't remember ever being able to put something together without looking at a book." She reported that she is clumsy when she cooks and will burn herself and drop things, she has had hand operations. Her husband thinks that her judgment and problem solving are not as good as they used to be. With respect to hobbies, she reported that she is "really into TV for the first time in my life." She also enjoys listening to music.  Past Medical History and Review of Relevant Studies   Patient Active Problem List   Diagnosis Date Noted   Memory loss 07/15/2020   Malnutrition ([place]) 12/22/2018   Hypokalemia 12/22/2018   Hypomagnesemia 12/22/2018   Hypophosphatemia 12/22/2018   Anxiety    Prolonged QT interval 12/21/2018   Hyponatremia 12/21/2018   Hyperlipidemia with target LDL less than 100 10/02/2018   Encounter for chronic pain management 11/28/2017   Dyspnea on exertion 06/01/2017   Mild aortic stenosis 05/27/2017   Routine general medical examination at a health care facility 07/09/2016   Recurrent falls 03/28/2016   Rosacea 08/18/2015   Lumbar radiculopathy 07/07/2015    Urinary incontinence 06/20/2015   Hemorrhoids 10/06/2014   Cough variant asthma vs UACS 08/03/2014   Labile hypertension 02/22/2014   Insomnia secondary to chronic pain 07/09/2012   B12 deficiency 04/03/2011   Unspecified vitamin D deficiency 07/25/2010   ANEMIA, IRON DEFICIENCY 04/25/2009   Allergic rhinitis 02/01/2009   Mild dilation of ascending aorta (HCC) 12/27/2008   Chronic fatigue syndrome 04/21/2008   Major depression in partial remission ([place]) 01/19/2008   OSTEOARTHRITIS 01/19/2008   Hypothyroidism 03/13/2007   Asthma 03/13/2007   Fibromyalgia 03/13/2007   Review of Neuroimaging and Relevant Medical History: Ms. Halliday has an MRI of the brain from 12/08/2020 that shows volume loss that is greater in the L than the R hemisphere, mainly in the perisylvian and temporal regions. There is significant volume loss in the left insula. There is bilateral volume loss in the frontal lobes, with a slight left sided predominance but it is not as striking as the degree of asymmetry in the insula. There is a mild+ to moderate burden of early confluent leukoaraiosis in the periventricular and subcortical  cerebral white matter, unlikely enough to cause a dementia level of function, in my opinion. There is an area of white matter change vs. small white matter stroke with a bid of a wedge shape within the left posterior frontal lobe. 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   EEG with left temporal slowing and bilateral temporal slowing with drowsiness noted.   The patient had an episode of decreased awareness and confusion about 12 years ago, she was evaluated and they told her they could see something in the left temporal lobe and it looked like a small stroke.   Current Outpatient Medications  Medication Sig Dispense Refill   ALPRAZolam (XANAX) 0.25 MG tablet TAKE 1 TABLET(0.25 MG) BY MOUTH DAILY AS NEEDED FOR  ANXIETY 30 tablet 5   amLODipine (NORVASC) 10 MG tablet TAKE 1 TABLET(10 MG) BY MOUTH DAILY 90 tablet 1   Bempedoic Acid (NEXLETOL) 180 MG TABS Take 180 mg by mouth daily. 90 tablet 3   carisoprodol (SOMA) 350 MG tablet Take 1 tablet (350 mg total) by mouth 3 (three) times daily as needed for muscle spasms. 60 tablet 3   cloNIDine (CATAPRES) 0.2 MG tablet Take 0.5 tablets (0.1 mg total) by mouth 2 (two) times daily. May take an additional  0.1 mg (1/2 tablet of 0.2 mg) if blood pressure is great than 974 systolic  for more than an hour 60 tablet 1   Eszopiclone 3 MG TABS TAKE 1 TABLET BY MOUTH AT BEDTIME 90 tablet 0   ezetimibe (ZETIA) 10 MG tablet TAKE 1 TABLET(10 MG) BY MOUTH DAILY 90 tablet 3   famotidine (PEPCID) 20 MG tablet Take 20 mg by mouth as needed.      feeding supplement, GLUCERNA SHAKE, (GLUCERNA SHAKE) LIQD Take 237 mLs by mouth daily.     FLUoxetine (PROZAC) 40 MG capsule TAKE 1 CAPSULE(40 MG) BY MOUTH TWICE DAILY 180 capsule 0   levalbuterol (XOPENEX HFA) 45 MCG/ACT inhaler Inhale 1-2 puffs into the lungs every 8 (eight) hours as needed for wheezing. (Patient taking differently: Inhale 1-2 puffs into the lungs every 8 (eight) hours as needed for wheezing. Patient takes 60 MG) 1 Inhaler 2   levothyroxine (SYNTHROID) 125 MCG tablet TAKE 1 TABLET(125 MCG) BY MOUTH DAILY 90 tablet 1   MINIVELLE 0.05 MG/24HR patch Place 1 patch onto the skin 2 (two) times a week.   3   montelukast (SINGULAIR) 10 MG tablet TAKE 1 TABLET(10 MG) BY MOUTH AT BEDTIME 90 tablet 1   MYRBETRIQ 50 MG TB24 tablet TAKE 1 TABLET(50 MG) BY MOUTH DAILY 90 tablet 1   nebivolol (BYSTOLIC) 10 MG tablet TAKE 1 TABLET(10 MG) BY MOUTH DAILY AT 8 PM 90 tablet 3   omeprazole (PRILOSEC) 20 MG capsule Take 20 mg by mouth daily as needed.     Respiratory Therapy Supplies (FLUTTER) DEVI Use as directed 1 each 0   No current facility-administered medications for this visit.   Family History  Problem Relation  Age of Onset   Arthritis Mother    Leukemia Father    Heart disease Maternal Uncle    Diabetes Paternal Aunt    Breast cancer Maternal Grandmother    Heart disease Maternal Grandfather    Breast cancer Paternal Grandmother    Colon cancer Neg Hx    There is no  family history of dementia. There is no  family history of psychiatric illness.  Psychosocial History  Developmental, Educational and  Employment History: The patient grew up in [place]. The patient reported that she was a good student who was never held back, had no learning difficulties, and was 4th in her class. She got married, her husband was very career oriented and they moved every year or two and eventually divorced after 18 years. 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 She worked in a variety of different capacities, she particularly enjoyed postop nursing. 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 Eventually, she got sick with her chronic fatigue, and she went out on disability at that time. This was around 75 years of age.   Psychiatric History: The patient has some history of depression in her first marriage, with one inpatient hospitalization for about a week, it does not sound like she was suicidal. She otherwise has no significant psychiatric history. She is on prozac, although she wasn't sure how long she has been on it. She thinks maybe six years. She has a script for alprazolam but said she doesn't take it often.   Substance Use History: The patient drinks one to two drinks per day, she didn't used to drink, this started a few years ago. They weren't really clear why she started. She is a non smoker and has never smoked.    Relationship History and Living Cimcumstances: The patient has been married twice, once for 20 years, and to her second husband for a number of years.   Mental Status and Behavioral Observations  Sensorium/Arousal:  The patient's level of arousal was awake and alert. Hearing and vision were adequate with correction (glasses) for testing purposes. Orientation: Patient was generally oriented.  Appearance: Dressed neatly in appropriate, business casual attire, with good grooming and hygiene.  Behavior: Pleaseant, appropriate. Technician noted difficulties navigating visual test forms and with scanning during assessment.  Speech/language: The patient's speech was fluent, I did not notice any articulatory issues or speech praxis problems but she did have the occasional word finding difficulty and some semantic paraphasias. She also seemed to lose track of what she wanted to say occasionally.  Gait/Posture: Snow, cautious, nevertheless reasonable stride length. Armswing not well observed given cane and briefcase Movement: The patient had a bit of postural/action tremor. Cortical Motor/Sensory Functioning: The patient had very poor luria motor programming. She was able to get into set with the right but could not do the movements fluidly and continued to make errors. Praxis was 2/4 on the left and 2/4 on the right, with marked spatial errors on screwdriver use bilaterally, and many BPFT including on hammering in the right hand. There was no significant laterality and there were no seeking/content errors.  Social Comportment: Pleasant, appropriate Mood: "My nerves are shot" Affect: Mainly neutral, with an undercurrent of some anxiety Thought process/content: Thought process was logical and goal-oriented for the most part. She did have occasional losses of mental set.  Safety: Thoughts of harming self or others denied on direct questioning.  Insight: Fair  Mental status testing deferred, MoCA 17/30 with Dr. Delice Lesch.   Test Procedures  Wide Range Achievement Test - 4             Word Reading Neuropsychological Assessment Battery  List Learning  Story Learning  Daily Living Memory  Naming  Digit Span Repeatable  Battery for the Assessment of Neuropsychological Status (Form A)  Figure Copy  Judgment of Line Orientation  Coding  Figure Recall Multilingual Aphasia  Examination  Sentence Repetition  Aural Comprehension Greek Cross Drawing Lenny Pastel) The Dot Counting Test A Random Letter Test Controlled Oral Word Association (F-A-S) Semantic Fluency (Animals) Trail Making Test A & B Complex Ideational Material Modified [place] Card Sorting Test Geriatric Depression Scale - Short Form Quick Dementia Rating System (completed by husband, Ed)  Plan  Oval Linsey Kelch was seen for a psychiatric diagnostic evaluation and neuropsychological testing. She is a pleasant, 75 year old, right-hand dominant woman with a history of problems with language and memory over perhaps the past 2 years. She presented as quite lucid, but is screening in the moderate dementia range and appears to have significant memory problems, more minor language problems, and scattered low scores in other areas on preliminary review of her neuropsychological testing. MRI brain shows a pattern of atrophy that is not typical for typical presentations of AD, but her testing seems more ADish. Her husband has taken over some things but it is not clear that he did so related to her cognitive difficulties. Full and complete note with impressions, recommendations, and interpretation of test data to follow.   Viviano Simas Nicole Kindred, PsyD, [place] Clinical Neuropsychologist  Informed Consent and Coding/Compliance  Risks and benefits of the evaluation were discussed with the patient prior to all testing procedures. I conducted a clinical interview and neuropsychological testing (at least two tests) with Oval Linsey Stonehocker and Milana Kidney, B.S. (Technician) administered additional test procedures. The patient was able to tolerate the testing procedures and the patient (and/or family if applicable) is likely to benefit from further follow up to receive the  diagnosis and treatment recommendations, which will be rendered at the next encounter. Billing below reflects technician time, my direct face-to-face time with the patient, time spent in test administration, and time spent in professional activities including but not limited to: neuropsychological test interpretation, integration of neuropsychological test data with clinical history, report preparation, treatment planning, care coordination, and review of diagnostically pertinent medical history or studies.   Services associated with this encounter: Clinical Interview [phone]) plus 60 minutes (53748; Neuropsychological Evaluation by Professional)  115 minutes (27078; Neuropsychological Evaluation by Professional, Adl.) 30 minutes (67544; Neuropsychological Testing by Technician) 85 minutes (92010; Neuropsychological Testing by Technician, Adl.)

## 2020-12-28 NOTE — Progress Notes (Signed)
[facility]  Patient Name: Marissa [name] MRN: [id number] Date of Birth: 1946/08/22 Age: 75 y.o. Education: 15 years  Measurement properties of test scores: IQ, Index, and Standard Scores (SS): Mean = 100; Standard Deviation = 15 Scaled Scores (Ss): Mean = 10; Standard Deviation = 3 Z scores (Z): Mean = 0; Standard Deviation = 1 T scores (T); Mean = 50; Standard Deviation = 10  TEST SCORES:    Note: This summary of test scores accompanies the interpretive report and should not be interpreted by unqualified individuals or in isolation without reference to the report. Test scores are relative to age, gender, and educational history as available and appropriate.   Performance Validity        "A" Random Letter Test Raw  Descriptor      Errors 5 Below Expectation  The Dot Counting Test: 24 Below Expectation  NAB Effort Index 3 Below Expectation      Mental Status Screening     Total Score Descriptor  MoCA 17 Mild Dementia      Expected Functioning        Wide Range Achievement Test: Standard/Scaled Score Percentile      Word Reading 104 61      Attention/Processing Speed        Neuropsychological Assessment Battery (Attention Module, Form 1): T-score Percentile      Digits Forward 49 46      Digits Backwards 44 27      Repeatable Battery for the Assessment of Neuropsychological Status (Form A): Scaled Score Percentile      Coding 2 <1      Language        Neuropsychological Assessment Battery (Language Module, Form 1): T-score Percentile      Naming   (24) 29 2      Verbal Fluency: T-score Percentile      Controlled Oral Word Association (F-A-S) 35 7      Semantic Fluency (Animals) 32 4      Multilingual Aphasia Exam Percentile Classification      Sentence Repetition 41 Average      Aural Comprehension 25 Average      Memory:        Neuropsychological Assessment Battery (Memory Module, Form 1): T-score Percentile       List Learning           List A Immediate Recall   (1, 3, 5) 19 <1         List B Immediate Recall   (2) 37 9         List A Short Delayed Recall   (2) 28 2         List A Long Delayed Recall   (0) 27 1         List A Percent Retention   (0 %) --- <1         List A Long Delayed Yes/No Recognition Hits   (7) --- 2         List A Long Delayed Yes/No Recognition False Alarms   (8) --- 18         List A Recognition Discriminability Index --- 2      Story Learning           Immediate Recall   (8, 12) 19 <1         Delayed Recall   (9) 29 2         Percent  Retention   (75 %) --- 38      Daily Living Memory            Immediate Recall   (17, 5) 27 1          Delayed Recall   (4, 0) 20 <1          Percent Retention (44 %) --- <1          Recognition Hits    (4) --- <1      Repeatable Battery for the Assessment of Neuropsychological Status (Form A): Scaled Score Percentile         Figure Recall   (4) 4 2      Visuospatial/Constructional Functioning        Repeatable Battery for the Assessment of Neuropsychological Status (Form A): Standard/Scaled Score Percentile     Visuospatial/Constructional Index 69 2         Figure Copy   (16) 7 16         Judgment of Line Orientation   (7) --- <2      [place] Cross Drawing Optician, dispensing) T-score [place]      Copy 40 16      Executive Functioning        Modified [employer] Card Sorting Test (MWCST): Standard/T-Score Percentile      Number of Categories Correct 40 16      Number of Perseverative Errors 52 58      Number of Total Errors 47 38      Percent Perseverative Errors 54 66  Executive Function Composite 93 32      Trail Making Test: T-Score Percentile      Part A 19 <1      Part B 18 <1      [place] Diagnostic Aphasia Exam: Raw Score Scaled Score      Complex Ideational Material 9 5      Clock Drawing Raw Score Descriptor      Command 6 Mild Impairment      Rating Scales         Raw Score Descriptor  Patient Health Questionnaire - 9 7  Mild  GAD-7 5 Mild      Clinical Dementia Rating Raw Score Descriptor      Sum of Boxes 2.5 Very Mild Dementia      Global Score 0.5 MCI      Quick Dementia Rating System Raw Score Descriptor      Sum of Boxes 4 Very Mild Dementia      Total Score 6 Mild Dementia   Dylen Mcelhannon V. Nicole Kindred PsyD, [place] Clinical Neuropsychologist

## 2020-12-29 NOTE — Progress Notes (Signed)
[facility]  Patient Name: Brittaney Beaulieu MRN: [id number] Date of Birth: 11-09-1945 Age: 75 y.o. Education: 15 years  Clinical Impressions  Layliana Devins is a 75 y.o., right-hand dominant, married woman with a history of HTN, HLD, chronic fatigue, and cognitive problems that began with language changes vs thought process problems, anywhere from 2-3 years ago and have been worsening over time. She and her husband report that she sometimes slurs her speech, she will get "stuttery", and she also will repeat herself. She has chronic fatigue since her 75s and is also somewhat anxious and stressed as of late with a number of different health problems, which has further compounded things. Her husband recently retired and has started doing more such as the finances and shopping, although it is more because of physical limitations and to help out than because she was unable to do these things. She has an MRI brain that shows a significant amount of volume loss in the left insula and some volume loss in the frontal and temporal lobes, L > R. The asymmetry apart from the insula is not striking but is present.   On neuropsychological testing, Ms. Milligan demonstrated less than expected performance for her showing on measures of memory, processing speed, visuospatial/constructional function, verbal fluency measures, and there were scattered low scores on measures of executive function. Her memory could represent a developing storage problem but I also suspect an executive component given that she retained structured better than unstructured information and she is still retaining some information across time. Despite her language complaints and minor naming and fluency problems, her language difficulties seem to be mild and she did well with respect to comprehension and repetition on extended testing. Qualitatively, she did have notable issues with visual scanning, visually  mediated tests, and that may have undermined her performance in some other areas due to difficulty navigating test forms. 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 She also has qualitative movement complaints yet no real findings on elemental neurological exam.   Ms. Cermak is thus demonstrating some cognitive changes. Although she does have memory and naming problems, she is still retaining information and the extent of her difficulties in other areas (e.g., visuospatial) are not typical of amnestic MCI due to Alzheimer's disease. I am concerned that she has a neurodegenerative cause. 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 She has no behavior changes to suggest bvFTD, her visuospatial problems do not go well with PPA, and she has no signs/symptoms suggestive of Lewy Body Disease. Corticobasal presentations of AD and/or corticobasal degeneration certainly can present with many of the features she is demonstrating, but that is a relatively rare condition and she certainly does not meet critieria. 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 She should be followed over time and serial testing may be helpful to better elucidate the cause of her difficulties.   Diagnostic Impressions: Mild cognitive impairment Marital conflict  Recommendations to be discussed with patient  Your performance and presentation on assessment were suggestive of cognitive problems affecting memory, language, visuospatial functioning, and you also had difficulties in a few other areas. I think that your visuospatial problems undermined you on some of the tests, because you appeared to have problems with  visual scanning and navigating visual test forms.  Because you are still able to do everything that you used to do and your functioning is fairly well preserved, mild cognitive impairment presents as the best diagnosis.   The major difference between mild cognitive impairment (MCI) and dementia is in severity and potential prognosis. Once someone reaches a level of severity adequate to be diagnosed with a dementia, there is usually progression over time, though this may be years. On the other hand, mild cognitive impairment, while a significant risk for dementia in future, does not always progress to dementia, and in some instances stays the same or can even revert to normal. It is important to realize that if MCI is due to underlying Alzheimer's disease, it will most likely progress to dementia eventually. The rate of conversion to Alzheimer's dementia from amnestic MCI is about 15% per year versus the general population risk of conversion of 2% per year.   In your case, I am not entirely certain what is causing your mild cognitive impairment. There are conflicting findings across your brain imaging, neuropsychological testing, and clinical history. I am concerned that you have a progressive cause of cognitive impairment, like Alzheimer's disease, and thus that there may be worsening over time.   While we do not have disease modifying drug therapies for these types of conditions, there are many things you can do that may meaningfully affect the rate of progression, including lifestyle changes like diet, exercise, and also treating modifiable risk factors for cognitive impairment.   For one, you scored at a mild level on self-report rating of depressive symptoms and admit that you feel as though your nerves "are shot." You may consider counseling or psychotherapy. Because many of these concerns seem to center around your husband, you might even consider couples counseling, which could help you both learn to interact in ways that minimize conflict and  contribute to relational satisfaction (as opposed to discord).   There is now good quality evidence from at least one large scale study that a modified mediterranean diet may help slow cognitive decline. This is known as the "MIND" diet. The Mind diet is not so much a specific diet as it is a set of recommendations for things that you should and should not eat.   Foods that are ENCOURAGED on the MIND Diet:  Green, leafy vegetables: Aim for six or more servings per week. This includes kale, spinach, cooked greens and salads.  All other vegetables: Try to eat another vegetable in addition to the green leafy vegetables at least once a day. It is best to choose non-starchy vegetables because they have a lot of nutrients with a low number of calories.  Berries: Eat berries at least twice a week. There is a plethora of research on strawberries, and other berries such as blueberries, raspberries and blackberries have also been found to have antioxidant and brain health benefits.  Nuts: Try to get five servings of nuts or more each week. The creators of the [place] don't specify what kind of nuts to consume, but it is probably best to vary the type of nuts you eat to obtain a variety of nutrients. Peanuts are a legume and do not fall into this category.  Olive oil: Use olive oil as your main cooking oil. There may be other heart-healthy alternatives such as algae oil, though there is not yet sufficient research upon which to base a formal recommendation.  Whole grains: Aim for at least three  servings daily. Choose minimally processed grains like oatmeal, quinoa, brown rice, whole-wheat pasta and 100% whole-wheat bread.  Fish: Eat fish at least once a week. It is best to choose fatty fish like salmon, sardines, trout, tuna and mackerel for their high amounts of omega-3 fatty acids.  Beans: Include beans in at least four meals every week. This includes all beans, lentils and soybeans.  Poultry: Try to eat  chicken or [place] at least twice a week. Note that fried chicken is not encouraged on the MIND diet.  Wine: Aim for no more than one glass of alcohol daily. Both red and white wine may benefit the brain. However, much research has focused on the red wine compound resveratrol, which may help protect against Alzheimer's disease.  Foods that are DISCOURAGED on the MIND Diet: Butter and margarine: Try to eat less than 1 tablespoon (about 14 grams) daily. Instead, try using olive oil as your primary cooking fat, and dipping your bread in olive oil with herbs.  Cheese: The MIND diet recommends limiting your cheese consumption to less than once per week.  Red meat: Aim for no more than three servings each week. This includes all beef, pork, lamb and products made from these meats.  Maceo Pro food: The MIND diet highly discourages fried food, especially the kind from fast-food restaurants. Limit your consumption to less than once per week.  Pastries and sweets: This includes most of the processed junk food and desserts you can think of. Ice cream, cookies, brownies, snack cakes, donuts, candy and more. Try to limit these to no more than four times a week.  Exercise is one of the best medicines for promoting health and maintaining cognitive fitness at all stages in life. Exercise probably has the largest documented effect on brain health and performance of any lifestyle intervention. Studies have shown that even previously sedentary individuals who start exercising as late as age 68 show a significant survival benefit as compared to their non-exercising peers. In the [place], the current guidelines are for 30 minutes of moderate exercise per day, but increasing your activity level less than that may also be helpful. You do not have to get your 30 minutes of exercise in one shot and exercising for short periods of time spread throughout the day can be helpful. Go for several walks, learn to dance, or do something  else you enjoy that gets your body moving. Of course, if you have an underlying medical condition or there is any question about whether it is safe for you to exercise, you should consult a medical treatment provider prior to beginning exercise.   I would recommend that to the extent possible, you increase the amount of activities you are doing. Most people do not age as gracefully if they are not active, and it sounds like you have stopped doing many of your activities. This can create a vicious cycle where low activity level contributes to feelings of depression and low motivation that in turn result in lower levels of activity. This can be pleasant activities such as going for walks, starting a new hobby, or reading. I would also suggest that you and your husband plan time for pleasant activities together, even something simple like going for a drive or visiting a park. Research shows that in couples who report a high level of satisfaction, the number of positive interactions outweighs the number of negative interactions by 5 to 1.   Test Findings  Test scores are summarized in additional  documentation associated with this encounter. Test scores are relative to age, gender, and educational history as available and appropriate. There were no concerns about performance validity despite numerous findings falling below the level of expectations. These scores are consistent with her cognitive test performance and suggest genuine cognitive difficulties as opposed to performance validity issues as the likely cause.    General Intellectual Functioning/Achievement:  Performance on single word reading was toward the upper end of the average range, which presents as a reasonable standard of comparison for her cognitive test performance.   Attention and Processing Efficiency: Indicators of attention and working memory involving digit repetition generated average range scores. By contrast, timed number symbol  coding and simple numeric sequencing were extremely low, although she had difficulties visually navigating the test forms and that may have undermined her scores.   Language: Language findings showed intact repetition and comprehension with some mild verbal fluency and naming problems. Visual object confrontation naming was unusually low, although not very low, and some of these seemed to be misperception errors. She also performed at an unusually low level on generation of words in response to category and phonemic prompts. Sentence repetition was average and pointing to pictures on the basis of verbal prompts was average.   Visuospatial Function: Performance was low on visuospatial measures with an overall extremely low score on the RBANS v/s index. Copy of a line drawing was low average whereas judgment of angular line orientations was extremely low. [place] [employer] was average. Qualitatively, she had difficulties navigating test forms and with visual scanning.   Learning and Memory: Performance on memory measures was less than expected for a patient of average overall ability. The profile shows marked encoding problems and some difficulties with retention of information across time, although she did not present a clear memory storage problem as she is still remembering structured information.   In the verbal realm, Ms. Runkel learned 1, 3, and 5 words of a 12-item word list across three learning trials, which is extremely low. She retained 2 words at short delayed recall and did not retain any words at long-delayed recall, which is extremely low. Delayed yes/no recognition for words from the list versus foils was extremely low. She had similar performance when learning structured information in the form of a short story although her retention of information across time was good in this case and she recalled 75% of what she had initially encoded. Her scores were still extremely low related to  minimal initial encoding. Memory for brief daily living information was extremely low, although she did retain some information. Recognition was extremely low.   In the visual realm, delayed recall for a modestly complex geometric figure was extremely low (but once again, she did recall a few details).   Executive Functions: Performance on executive measures was variable and a couple of these measures may have been affected by visuospatial problems but there are likely at least some primary executive issues. Generation of words in response to given letters was unusually low. Alternating sequencing of numbers and letters of the alphabet was extremely low. Reasoning with complex verbal information was unusually low on the Complex [employer]. Clock drawing suggested "Mild impairment" with minor errors in spatial arrangement of the numbers and only one hand. By contrast, she did well on the Modified [employer], with an average Executive Function Composite score and reasonable scores for categories correct and perseverative errors.   Rating Scale(s): Ms. Cowie scored in the  mild range for depressive symptoms, although most of the symptoms she endorsed were somatic symptoms. She reported a mild level of anxiety symptoms. Her husband characterized her as functioning at a very mild to mild dementia level. 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 Much of her functional difficulty is on the basis of avoidance due to physical pain and/or emotional distress.   Viviano Simas Nicole Kindred PsyD, [place] Clinical Neuropsychologist

## 2021-01-03 ENCOUNTER — Other Ambulatory Visit: Payer: Self-pay

## 2021-01-03 ENCOUNTER — Encounter: Payer: Self-pay | Admitting: Counselor

## 2021-01-03 ENCOUNTER — Ambulatory Visit (INDEPENDENT_AMBULATORY_CARE_PROVIDER_SITE_OTHER): Payer: Medicare Other | Admitting: Counselor

## 2021-01-03 DIAGNOSIS — G3184 Mild cognitive impairment, so stated: Secondary | ICD-10-CM

## 2021-01-03 NOTE — Patient Instructions (Signed)
Your performance and presentation on assessment were suggestive of cognitive problems affecting memory, language, visuospatial functioning, and you also had difficulties in a few other areas. I think that your visuospatial problems undermined you on some of the tests, because you appeared to have problems with visual scanning and navigating visual test forms. Because you are still able to do everything that you used to do and your functioning is fairly well preserved, mild cognitive impairment presents as the best diagnosis.   The major difference between mild cognitive impairment (MCI) and dementia is in severity and potential prognosis. Once someone reaches a level of severity adequate to be diagnosed with a dementia, there is usually progression over time, though this may be years. On the other hand, mild cognitive impairment, while a significant risk for dementia in future, does not always progress to dementia, and in some instances stays the same or can even revert to normal.It is important to realize that if MCI is due to underlying Alzheimer's disease, it will most likely progress to dementia eventually. The rate of conversion to Alzheimer's dementiafrom amnestic MCI is about 15% per year versus the general population risk of conversion of 2% per year.  In your case, I am not entirely certain what is causing your mild cognitive impairment. There are conflicting findings across your brain imaging, neuropsychological testing, and clinical history. I am concerned that you have a progressive cause of cognitive impairment, like Alzheimer's disease, and thus that there may be worsening over time.   While we do not have disease modifying drug therapies for these types of conditions, there are many things you can do that may meaningfully affect the rate of progression, including lifestyle changes like diet, exercise, and also treating modifiable risk factors for cognitive impairment.   For one, you  scored at a mild level on self-report rating of depressive symptoms and admit that you feel as though your nerves "are shot." You may consider counseling or psychotherapy. Because many of these concerns seem to center around your husband, you might even consider couples counseling, which could help you both learn to interact in ways that minimize conflict and contribute to relational satisfaction (as opposed to discord).   There is now good quality evidence from at least one large scale study that a modified mediterranean diet may help slow cognitive decline. This is known as the "MIND" diet. The Mind diet is not so much a specific diet as it is a set of recommendations for things that you should and should not eat.   Foods that are ENCOURAGED on the MIND Diet:  Green, leafy vegetables: Aim for six or more servings per week. This includes kale, spinach, cooked greens and salads.  All other vegetables: Try to eat another vegetable in addition to the green leafy vegetables at least once a day. It is best to choose non-starchy vegetables because they have a lot of nutrients with a low number of calories.  Berries: Eat berries at least twice a week. There is a plethora of research on strawberries, and other berries such as blueberries, raspberries and blackberries have also been found to have antioxidant and brain health benefits.  Nuts: Try to get five servings of nuts or more each week. The creators of the [place] don't specify what kind of nuts to consume, but it is probably best to vary the type of nuts you eat to obtain a variety of nutrients. Peanuts are a legume and do not fall into this category.  Olive oil: Use olive oil as your main cooking oil. There may be other heart-healthy alternatives such as algae oil, though there is not yet sufficient research upon which to base a formal recommendation.  Whole grains: Aim for at least three servings daily. Choose minimally processed grains like oatmeal,  quinoa, brown rice, whole-wheat pasta and 100% whole-wheat bread.  Fish: Eat fish at least once a week. It is best to choose fatty fish like salmon, sardines, trout, tuna and mackerel for their high amounts of omega-3 fatty acids.  Beans: Include beans in at least four meals every week. This includes all beans, lentils and soybeans.  Poultry: Try to eat chicken or [place] at least twice a week. Note that fried chicken is not encouraged on the MIND diet.  Wine: Aim for no more than one glass of alcohol daily. Both red and white wine may benefit the brain. However, much research has focused on the red wine compound resveratrol, which may help protect against Alzheimer's disease.  Foods that are DISCOURAGED on the MIND Diet: Butter and margarine: Try to eat less than 1 tablespoon (about 14 grams) daily. Instead, try using olive oil as your primary cooking fat, and dipping your bread in olive oil with herbs.  Cheese: The MIND diet recommends limiting your cheese consumption to less than once per week.  Red meat: Aim for no more than three servings each week. This includes all beef, pork, lamb and products made from these meats.  Maceo Pro food: The MIND diet highly discourages fried food, especially the kind from fast-food restaurants. Limit your consumption to less than once per week.  Pastries and sweets: This includes most of the processed junk food and desserts you can think of. Ice cream, cookies, brownies, snack cakes, donuts, candy and more. Try to limit these to no more than four times a week.  Exercise is one of the best medicines for promoting health and maintaining cognitive fitness at all stages in life. Exercise probably has the largest documented effect on brain health and performance of any lifestyle intervention. Studies have shown that even previously sedentary individuals who start exercising as late as age 76 show a significant survival benefit as compared to their non-exercising peers. In  the [place], the current guidelines are for 30 minutes of moderate exercise per day, but increasing your activity level less than that may also be helpful. You do not have to get your 30 minutes of exercise in one shot and exercising for short periods of time spread throughout the day can be helpful. Go for several walks, learn to dance, or do something else you enjoy that gets your body moving. Of course, if you have an underlying medical condition or there is any question about whether it is safe for you to exercise, you should consult a medical treatment provider prior to beginning exercise.   I would recommend that to the extent possible, you increase the amount of activities you are doing. Most people do not age as gracefully if they are not active, and it sounds like you have stopped doing many of your activities. This can create a vicious cycle where low activity level contributes to feelings of depression and low motivation that in turn result in lower levels of activity. This can be pleasant activities such as going for walks, starting a new hobby, or reading. I would also suggest that you and your husband plan time for pleasant activities together, even something simple like going for a drive  or visiting a park. Research shows that in couples who report a high level of satisfaction, the number of positive interactions outweighs the number of negative interactions by 5 to 1.

## 2021-01-03 NOTE — Progress Notes (Signed)
NEUROPSYCHOLOGY FEEDBACK NOTE [facility]  Feedback Note: I met with Marissa [name] to review the findings resulting from her neuropsychological evaluation. Since the last appointment, she has been about the same. Unfortunately, she continues to have numerous physical issues and neuromuscular discomfort and also headaches. I suggested she discuss headaches with Dr. [name] and/or Dr. Delice Lesch. Time was spent reviewing the impressions and recommendations that are detailed in the evaluation report. We discussed impression of mild cognitive impairment, and I was candid with her that I am concerned this is at risk for decline and represents a prodrome. Nevertheless, I think she is still more at an MCI level problem. We reviewed lifestyle changes and also relationship changes that may be helpful. Other topics as reflected in the patient instructions. I took time to explain the findings and answer all the patient's questions. I encouraged Marissa [name] to contact me should she have any further questions or if further follow up is desired.   Current Medications and Medical History   Current Outpatient Medications  Medication Sig Dispense Refill  . ALPRAZolam (XANAX) 0.25 MG tablet TAKE 1 TABLET(0.25 MG) BY MOUTH DAILY AS NEEDED FOR ANXIETY 30 tablet 5  . amLODipine (NORVASC) 10 MG tablet TAKE 1 TABLET(10 MG) BY MOUTH DAILY 90 tablet 1  . Bempedoic Acid (NEXLETOL) 180 MG TABS Take 180 mg by mouth daily. 90 tablet 3  . carisoprodol (SOMA) 350 MG tablet Take 1 tablet (350 mg total) by mouth 3 (three) times daily as needed for muscle spasms. 60 tablet 3  . cloNIDine (CATAPRES) 0.2 MG tablet Take 0.5 tablets (0.1 mg total) by mouth 2 (two) times daily. May take an additional  0.1 mg (1/2 tablet of 0.2 mg) if blood pressure is great than 383 systolic  for more than an hour 60 tablet 1  . Eszopiclone 3 MG TABS TAKE 1 TABLET BY MOUTH AT BEDTIME 90 tablet 0  . ezetimibe (ZETIA) 10 MG tablet TAKE 1 TABLET(10  MG) BY MOUTH DAILY 90 tablet 3  . famotidine (PEPCID) 20 MG tablet Take 20 mg by mouth as needed.     . feeding supplement, GLUCERNA SHAKE, (GLUCERNA SHAKE) LIQD Take 237 mLs by mouth daily.    [name] Kitchen FLUoxetine (PROZAC) 40 MG capsule TAKE 1 CAPSULE(40 MG) BY MOUTH TWICE DAILY 180 capsule 0  . levalbuterol (XOPENEX HFA) 45 MCG/ACT inhaler Inhale 1-2 puffs into the lungs every 8 (eight) hours as needed for wheezing. (Patient taking differently: Inhale 1-2 puffs into the lungs every 8 (eight) hours as needed for wheezing. Patient takes 60 MG) 1 Inhaler 2  . levothyroxine (SYNTHROID) 125 MCG tablet TAKE 1 TABLET(125 MCG) BY MOUTH DAILY 90 tablet 1  . MINIVELLE 0.05 MG/24HR patch Place 1 patch onto the skin 2 (two) times a week.   3  . montelukast (SINGULAIR) 10 MG tablet TAKE 1 TABLET(10 MG) BY MOUTH AT BEDTIME 90 tablet 1  . MYRBETRIQ 50 MG TB24 tablet TAKE 1 TABLET(50 MG) BY MOUTH DAILY 90 tablet 1  . nebivolol (BYSTOLIC) 10 MG tablet TAKE 1 TABLET(10 MG) BY MOUTH DAILY AT 8 PM 90 tablet 3  . omeprazole (PRILOSEC) 20 MG capsule Take 20 mg by mouth daily as needed.    [name] Kitchen Respiratory Therapy Supplies (FLUTTER) DEVI Use as directed 1 each 0   No current facility-administered medications for this visit.    Patient Active Problem List   Diagnosis Date Noted  . Memory loss 07/15/2020  . Malnutrition ([place]) 12/22/2018  .  Hypokalemia 12/22/2018  . Hypomagnesemia 12/22/2018  . Hypophosphatemia 12/22/2018  . Anxiety   . Prolonged QT interval 12/21/2018  . Hyponatremia 12/21/2018  . Hyperlipidemia with target LDL less than 100 10/02/2018  . Encounter for chronic pain management 11/28/2017  . Dyspnea on exertion 06/01/2017  . Mild aortic stenosis 05/27/2017  . Routine general medical examination at a health care facility 07/09/2016  . Recurrent falls 03/28/2016  . Rosacea 08/18/2015  . Lumbar radiculopathy 07/07/2015  . Urinary incontinence 06/20/2015  . Hemorrhoids 10/06/2014  . Cough variant asthma  vs UACS 08/03/2014  . Labile hypertension 02/22/2014  . Insomnia secondary to chronic pain 07/09/2012  . B12 deficiency 04/03/2011  . Unspecified vitamin D deficiency 07/25/2010  . ANEMIA, IRON DEFICIENCY 04/25/2009  . Allergic rhinitis 02/01/2009  . Mild dilation of ascending aorta (HCC) 12/27/2008  . Chronic fatigue syndrome 04/21/2008  . Major depression in partial remission ([place]) 01/19/2008  . OSTEOARTHRITIS 01/19/2008  . Hypothyroidism 03/13/2007  . Asthma 03/13/2007  . Fibromyalgia 03/13/2007    Mental Status and Behavioral Observations  Marissa [name] presented on time to the present encounter and was alert and generally oriented. Speech was normal in rate, rhythm, volume, and prosody. Self-reported mood was "allright" and affect was mildly dysphoric. Thought process was logical and goal oriented and thought content was appropriate. There were no safety concerns identified at today's encounter, such as thoughts of harming self or others.   Plan  Feedback provided regarding the patient's neuropsychological evaluation. She seemed somewhat confused during the encounter and did forget some of the content discussed, but she was very appreciative and was given detailed notes. She and her husband do have some problematic patterns of relating of which they are aware, they will try to work on them. Couples counseling could be considered in the future if they need help. Marissa [name] was encouraged to contact me if any questions arise or if further follow up is desired.   Viviano Simas Nicole Kindred, PsyD, ABN Clinical Neuropsychologist  Service(s) Provided at This Encounter: 48 minutes [phone]; Conjoint therapy with patient present)

## 2021-01-13 ENCOUNTER — Encounter: Payer: Self-pay | Admitting: Cardiology

## 2021-01-13 NOTE — Assessment & Plan Note (Signed)
Monitored by Dr. Roxan Hockey.  Due for MRA of the chest next year for follow-up.  Has been stable.  We do want to control blood pressure, but unfortunately in this area there is the expensive side effects.  We will also try to treat her cholesterol which again is at the risk of triggering side effects as well.

## 2021-01-13 NOTE — Assessment & Plan Note (Signed)
I wonder if some of her fatigue is because of this.  Certainly not made any better with clonidine. Wonder she has a sleep disorder as well.

## 2021-01-13 NOTE — Assessment & Plan Note (Signed)
Recommendations for mild aortic stenosis follow-up on echo would be every 2-3 years.  Was just checked in October.  Since it was stable I think we can wait at least 2 if not 3 years.

## 2021-01-13 NOTE — Assessment & Plan Note (Signed)
She is too fatigued with clonidine.  Unfortunately were just really stuck her quality of life is not good with clonidine as I think it is better to just use it as needed for systolic pressures that are greater than 150 mmHg.  Otherwise will stay with current dose of Bystolic and amlodipine.  Short of using doxazosin, restarted run out of options.  She is very difficult to manage, just met anything we try it will lead to symptoms.

## 2021-01-13 NOTE — Assessment & Plan Note (Signed)
I am not really sure what the symptoms she had when she tried Nexletol was.  I think her lipids from January are just not in control.  She had issues with PCSK9 inhibitors-concerned about how to give herself shots.  She is on Zetia, intolerant of any statin retried.  Bhagat the only option we have now is to try to restart Nexletol.  Review to see if she can tolerate it and recheck labs in about 4 months.

## 2021-01-19 ENCOUNTER — Encounter: Payer: Self-pay | Admitting: Neurology

## 2021-01-19 ENCOUNTER — Ambulatory Visit (INDEPENDENT_AMBULATORY_CARE_PROVIDER_SITE_OTHER): Payer: Medicare Other | Admitting: Neurology

## 2021-01-19 ENCOUNTER — Other Ambulatory Visit: Payer: Self-pay

## 2021-01-19 VITALS — BP 135/77 | HR 86 | Ht 64.0 in | Wt 142.6 lb

## 2021-01-19 DIAGNOSIS — R29898 Other symptoms and signs involving the musculoskeletal system: Secondary | ICD-10-CM

## 2021-01-19 DIAGNOSIS — G3184 Mild cognitive impairment, so stated: Secondary | ICD-10-CM | POA: Diagnosis not present

## 2021-01-19 MED ORDER — DONEPEZIL HCL 5 MG PO TABS
ORAL_TABLET | ORAL | 11 refills | Status: DC
Start: 2021-01-19 — End: 2021-02-27
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
## 2021-01-19 NOTE — Patient Instructions (Signed)
1. Start Donepezil 69m: Take 1/2 tablet daily for 2 weeks, then increase to 1 tablet daily  2. Referral will be sent for home physical therapy and speech therapy  3. Schedule repeat Neurocognitive testing in March 2023  4. Follow-up in 6-8 months, call for any changes   FALL PRECAUTIONS: Be cautious when walking. Scan the area for obstacles that may increase the risk of trips and falls. When getting up in the mornings, sit up at the edge of the bed for a few minutes before getting out of bed. Consider elevating the bed at the head end to avoid drop of blood pressure when getting up. Walk always in a well-lit room (use night lights in the walls). Avoid area rugs or power cords from appliances in the middle of the walkways. Use a walker or a cane if necessary and consider physical therapy for balance exercise. Get your eyesight checked regularly.  FINANCIAL OVERSIGHT: Supervision, especially oversight when making financial decisions or transactions is also recommended as difficulties arise.  HOME SAFETY: Consider the safety of the kitchen when operating appliances like stoves, microwave oven, and blender. Consider having supervision and share cooking responsibilities until no longer able to participate in those. Accidents with firearms and other hazards in the house should be identified and addressed as well.  DRIVING: Regarding driving, in patients with progressive memory problems, driving will be impaired. We advise to have someone else do the driving if trouble finding directions or if minor accidents are reported. Independent driving assessment is available to determine safety of driving.  ABILITY TO BE LEFT ALONE: If patient is unable to contact 911 operator, consider using LifeLine, or when the need is there, arrange for someone to stay with patients. Smoking is a fire hazard, consider supervision or cessation. Risk of wandering should be assessed by caregiver and if detected at any point,  supervision and safe proof recommendations should be instituted.  MEDICATION SUPERVISION: Inability to self-administer medication needs to be constantly addressed. Implement a mechanism to ensure safe administration of the medications.  RECOMMENDATIONS FOR ALL PATIENTS WITH MEMORY PROBLEMS: 1. Continue to exercise (Recommend 30 minutes of walking everyday, or 3 hours every week) 2. Increase social interactions - continue [place] with friends and family 3. Eat healthy, avoid fried foods and eat more fruits and vegetables 4. Maintain adequate blood pressure, blood sugar, and blood cholesterol level. Reducing the risk of stroke and cardiovascular disease also helps promoting better memory. 5. Avoid stressful situations. Live a simple life and avoid aggravations. Organize your time and prepare for the next day in anticipation. 6. Sleep well, avoid any interruptions of sleep and avoid any distractions in the bedroom that may interfere with adequate sleep quality 7. Avoid sugar, avoid sweets as there is a strong link between excessive sugar intake, diabetes, and cognitive impairment We discussed the Mediterranean diet, which has been shown to help patients reduce the risk of progressive memory disorders and reduces cardiovascular risk. This includes eating fish, eat fruits and green leafy vegetables, nuts like almonds and hazelnuts, walnuts, and also use olive oil. Avoid fast foods and fried foods as much as possible. Avoid sweets and sugar as sugar use has been linked to worsening of memory function.  There is always a concern of gradual progression of memory problems. If this is the case, then we may need to adjust level of care according to patient needs. Support, both to the patient and caregiver, should then be put  into place.

## 2021-01-26 ENCOUNTER — Other Ambulatory Visit: Payer: Self-pay | Admitting: Internal Medicine

## 2021-02-05 ENCOUNTER — Telehealth: Payer: Self-pay | Admitting: Neurology

## 2021-02-05 NOTE — Telephone Encounter (Signed)
Patient called in stating she has been having some side effects from the donepezil. She had trouble getting to sleep, but once she did she had good deep sleep and was just a little bit "woozie" and nausea , but tolerated it. When she started the full dose, she has had the same amount of nausea. Over the weekend she has had a lot more nausea and couldn't get to sleep until 3 AM. She does feel she is getting better with her cognition, so she does want to keep taking the medication.

## 2021-02-05 NOTE — Telephone Encounter (Signed)
Marissa [name] from [facility] called to let the doctor know the patient has declined all treatment multiple times. Fyi only.

## 2021-02-06 NOTE — Telephone Encounter (Signed)
Pls let her know that there are other medications that can help slow down memory loss that hopefully will not cause the same side effects. Pls have her stop the Donepezil and after a week (if no more of the nausea and sleep problems), start Rivastigmine 1.26m BID. Side effects can be similar to Donepezil, but hopefully she tolerates better. If she is agreeable, pls send in Rx. Thanks

## 2021-02-06 NOTE — Telephone Encounter (Signed)
Pt called and informed that there are other medications that can help slow down memory loss that hopefully will not cause the same side effects. Pls have her stop the Donepezil and after a week (if no more of the nausea and sleep problems), start Rivastigmine 1.2m BID. Side effects can be similar to Donepezil, but hopefully she tolerates better. Will send the script in next week so that pt will not start it to soon

## 2021-02-15 ENCOUNTER — Telehealth: Payer: Self-pay | Admitting: Neurology

## 2021-02-15 NOTE — Telephone Encounter (Signed)
Patient called in stating she is ready to try one of the other medications for her memory loss since she is still having some problems with it. She states she is highly sensitive to medications. She is ok with waiting until Dr. Delice Lesch returns to the office to start a new medication.

## 2021-02-19 ENCOUNTER — Other Ambulatory Visit: Payer: Self-pay

## 2021-02-19 MED ORDER — RIVASTIGMINE TARTRATE 1.5 MG PO CAPS: 1.5000 mg | ORAL_CAPSULE | Freq: Two times a day (BID) | ORAL | 2 refills | Status: DC

## 2021-02-24 ENCOUNTER — Other Ambulatory Visit: Payer: Self-pay | Admitting: Cardiology

## 2021-02-27 NOTE — Telephone Encounter (Signed)
Pls confirm, Rivastigmine 1.56m BID was sent on 02/19/21 for her to start, thanks

## 2021-02-27 NOTE — Telephone Encounter (Signed)
Pharmacy called they got the script pt picked it up a week ago

## 2021-03-13 ENCOUNTER — Telehealth: Payer: Self-pay | Admitting: Internal Medicine

## 2021-03-13 NOTE — Progress Notes (Signed)
  Chronic Care Management   Note  03/13/2021 Name: Ilah Boule MRN: [id number] DOB: 02-26-1946  Oval Linsey Kysar is a 75 y.o. year old female who is a primary care patient of [name], MD. I reached out to [place] by phone today in response to a referral sent by Ms. Oval Linsey Netzley's PCP, [name], MD.   Ms. Bonsall was given information about Chronic Care Management services today including:  1. CCM service includes personalized support from designated clinical staff supervised by her physician, including individualized plan of care and coordination with other care providers 2. 24/7 contact phone numbers for assistance for urgent and routine care needs. 3. Service will only be billed when office clinical staff spend 20 minutes or more in a month to coordinate care. 4. Only one practitioner may furnish and bill the service in a calendar month. 5. The patient may stop CCM services at any time (effective at the end of the month) by phone call to the office staff.   Patient agreed to services and verbal consent obtained.   Follow up plan:   Lauretta Grill [facility] Scheduler

## 2021-03-14 ENCOUNTER — Other Ambulatory Visit: Payer: Self-pay | Admitting: Internal Medicine

## 2021-03-15 DIAGNOSIS — H40013 Open angle with borderline findings, low risk, bilateral: Secondary | ICD-10-CM | POA: Diagnosis not present

## 2021-03-15 DIAGNOSIS — H35371 Puckering of macula, right eye: Secondary | ICD-10-CM | POA: Diagnosis not present

## 2021-03-15 DIAGNOSIS — Z961 Presence of intraocular lens: Secondary | ICD-10-CM | POA: Diagnosis not present

## 2021-03-15 DIAGNOSIS — H04123 Dry eye syndrome of bilateral lacrimal glands: Secondary | ICD-10-CM | POA: Diagnosis not present

## 2021-03-27 NOTE — Progress Notes (Signed)
Chronic Care Management Pharmacy Note  03/29/2021 Name:  Marissa [name] MRN:  [id number] DOB:  [birth year]  Subjective: Marissa [name] is an 75 y.o. year old female who is a primary patient of [name] Real Cons, MD.  The CCM team was consulted for assistance with disease management and care coordination needs.    Engaged with patient face to face for initial visit in response to provider referral for pharmacy case management and/or care coordination services.   Consent to Services:  The patient was given the following information about Chronic Care Management services today, agreed to services, and gave verbal consent: 1. CCM service includes personalized support from designated clinical staff supervised by the primary care provider, including individualized plan of care and coordination with other care providers 2. 24/7 contact phone numbers for assistance for urgent and routine care needs. 3. Service will only be billed when office clinical staff spend 20 minutes or more in a month to coordinate care. 4. Only one practitioner may furnish and bill the service in a calendar month. 5.The patient may stop CCM services at any time (effective at the end of the month) by phone call to the office staff. 6. The patient will be responsible for cost sharing (co-pay) of up to 20% of the service fee (after annual deductible is met). Patient agreed to services and consent obtained.  Patient Care Team: [name], MD as PCP - General (Internal Medicine) [name], MD as PCP - Cardiology (Cardiology) Melrose Nakayama, MD as Consulting Physician (Cardiothoracic Surgery) [name], MD as Consulting Physician (Cardiology) Tanda Rockers, MD as Consulting Physician (Pulmonary Disease) Cameron Sprang, MD as Consulting Physician (Neurology) Tomasa Blase, [facility] as Pharmacist (Pharmacist)  Recent office visits: 11/21/2020 - PCP visit - no changes at this time   Recent  consult visits: 01/19/2021 -Dr. Delice Lesch - Neurology - evaluation for neurocognitive issues- started on donepezil - neurocognitive testing ordered  01/03/2021 - Dr. Nicole Kindred - Neuropsychology - assessment completed today, results reviewed with patient and husband - indicated that patient has progressive cognitive issues which are likely the cause of her memory, visual, and language issues - recommended MIND diet  12/21/2020 -Dr. Ellyn Hack - Cardiology - ECG - stable - recommended for patient to re-trial nexletol, reviewed for patient to use clonidine for sustained BP over 150 11/01/2020 - Dr. Delice Lesch - Neurology - established care   Hospital visits: None in previous 6 months  Objective:  Lab Results  Component Value Date   CREATININE 1.23 (H) 11/21/2020   BUN 17 11/21/2020   GFR 42.86 (L) 11/21/2020   GFRNONAA 81 01/12/2020   GFRAA 93 01/12/2020   NA 133 (L) 11/21/2020   K 4.0 11/21/2020   CALCIUM 8.6 11/21/2020   CO2 26 11/21/2020   GLUCOSE 101 (H) 11/21/2020    Lab Results  Component Value Date/Time   GFR 42.86 (L) 11/21/2020 02:43 PM   GFR 79.17 01/06/2019 10:30 AM    Last diabetic Eye exam:  No results found for: HMDIABEYEEXA  Last diabetic Foot exam:  No results found for: HMDIABFOOTEX   Lab Results  Component Value Date   CHOL 246 (H) 11/21/2020   HDL 72.50 11/21/2020   LDLCALC 100 (H) 01/12/2020   LDLDIRECT 146.0 11/21/2020   TRIG 269.0 (H) 11/21/2020   CHOLHDL 3 11/21/2020    Hepatic Function Latest Ref Rng & Units 11/21/2020 01/12/2020 09/30/2019  Total Protein 6.0 - 8.3 g/dL 6.9 7.0 6.6  Albumin 3.5 - 5.2 g/dL 4.6 4.6 4.6  AST 0 - 37 U/L 87(H) 33 36  ALT 0 - 35 U/L 94(H) 28 38(H)  Alk Phosphatase 39 - 117 U/L 70 71 69  Total Bilirubin 0.2 - 1.2 mg/dL 0.5 0.5 0.4  Bilirubin, Direct 0.0 - 0.3 mg/dL - - -    Lab Results  Component Value Date/Time   TSH 1.51 11/01/2020 11:52 AM   TSH 0.265 (L) 01/12/2020 11:15 AM   FREET4 1.13 08/04/2018 02:08 PM   FREET4 1.22  08/07/2017 02:06 PM    CBC Latest Ref Rng & Units 11/21/2020 01/12/2020 12/31/2018  WBC 4.0 - 10.5 K/uL 6.3 4.3 5.0  Hemoglobin 12.0 - 15.0 g/dL 11.9(L) 12.6 10.7(L)  Hematocrit 36.0 - 46.0 % 34.8(L) 35.7 30.0(L)  Platelets 150.0 - 400.0 K/uL 178.0 267 341.0    Lab Results  Component Value Date/Time   VD25OH 42.11 11/27/2017 11:59 AM   VD25OH 16.58 (L) 08/07/2017 02:06 PM    Clinical ASCVD: No  The 10-year ASCVD risk score Mikey Bussing DC Jr., et al., 2013) is: 26.1%   Values used to calculate the score:     Age: 88 years     Sex: Female     Is Non-Hispanic African American: No     Diabetic: No     Tobacco smoker: No     Systolic Blood Pressure: 009 mmHg     Is BP treated: Yes     HDL Cholesterol: 72.5 mg/dL     Total Cholesterol: 246 mg/dL    Depression screen [facility] 2/9 07/27/2020 02/21/2020 07/26/2019  Decreased Interest 0 3 0  Down, Depressed, Hopeless 0 1 1  PHQ - 2 Score 0 4 1  Altered sleeping - 1 1  Tired, decreased energy - 1 2  Change in appetite - 0 1  Feeling bad or failure about yourself  - 1 0  Trouble concentrating - 3 0  Moving slowly or fidgety/restless - 2 0  Suicidal thoughts - 0 0  PHQ-9 Score - 12 5  Difficult doing work/chores - Somewhat difficult Not difficult at all  Some recent data might be hidden     Social History   Tobacco Use  Smoking Status Never Smoker  Smokeless Tobacco Never Used   BP Readings from Last 3 Encounters:  01/19/21 135/77  12/21/20 140/76  11/21/20 134/82   Pulse Readings from Last 3 Encounters:  01/19/21 86  12/21/20 60  11/21/20 63   Wt Readings from Last 3 Encounters:  01/19/21 142 lb 9.6 oz (64.7 kg)  12/21/20 141 lb (64 kg)  11/21/20 143 lb (64.9 kg)   BMI Readings from Last 3 Encounters:  01/19/21 24.48 kg/m  12/21/20 24.20 kg/m  11/21/20 24.55 kg/m    Assessment/Interventions: Review of patient past medical history, allergies, medications, health status, including review of consultants reports, laboratory  and other test data, was performed as part of comprehensive evaluation and provision of chronic care management services.   SDOH:  (Social Determinants of Health) assessments and interventions performed: Yes  SDOH Screenings   Alcohol Screen: Low Risk   . Last Alcohol Screening Score (AUDIT): 3  Depression (PHQ2-9): Low Risk   . PHQ-2 Score: 0  Financial Resource Strain: Low Risk   . Difficulty of Paying Living Expenses: Not hard at all  Food Insecurity: No Food Insecurity  . Worried About Charity fundraiser in the Last Year: Never true  . Ran Out of Food in the Last Year:  Never true  Housing: Low Risk   . Last Housing Risk Score: 0  Physical Activity: Inactive  . Days of Exercise per Week: 0 days  . Minutes of Exercise per Session: 0 min  Social Connections: Not on file  Stress: No Stress Concern Present  . Feeling of Stress : Not at all  Tobacco Use: Low Risk   . Smoking Tobacco Use: Never Smoker  . Smokeless Tobacco Use: Never Used  Transportation Needs: No Transportation Needs  . Lack of Transportation (Medical): No  . Lack of Transportation (Non-Medical): No    CCM Care Plan  Allergies  Allergen Reactions  . Fentanyl Shortness Of Breath and Rash  . Rosuvastatin Anaphylaxis, Anxiety, Hives, Itching, Nausea And Vomiting, Other (See Comments), Palpitations, Photosensitivity and Shortness Of Breath  . Amoxicillin Diarrhea    Severe diarrhea, rash to vaginal area with swelling   . Hctz [Hydrochlorothiazide] Other (See Comments)    HypoNatremia  . Hydralazine     Weak, sweats, red skin  . Codeine Other (See Comments)    makes her hyper  . Conjugated Estrogens Itching and Rash  . Erythromycin Rash    had a rash with emycin, has done ok with other meds in it's class  . Piroxicam Itching and Rash    Feldene    Medications Reviewed Today    Reviewed by Tomasa Blase, [facility] (Pharmacist) on 03/29/21 at 1615  Med List Status: <None>  Medication Order Taking? Sig  Documenting Provider Last Dose Status Informant  ALPRAZolam (XANAX) 0.25 MG tablet [id number] Yes TAKE 1 TABLET(0.25 MG) BY MOUTH DAILY AS NEEDED FOR ANXIETY [name], MD Taking Active   amLODipine (NORVASC) 10 MG tablet [id number] Yes TAKE 1 TABLET(10 MG) BY MOUTH DAILY [name], MD Taking Active   Bempedoic Acid (NEXLETOL) 180 MG TABS [id number] No Take 180 mg by mouth daily. [name], MD Unknown Active   carisoprodol (SOMA) 350 MG tablet [id number] Yes Take 1 tablet (350 mg total) by mouth 3 (three) times daily as needed for muscle spasms.  Patient taking differently: Take 175 mg by mouth 2 (two) times daily as needed for muscle spasms.   [name], MD Taking Active   cloNIDine (CATAPRES) 0.2 MG tablet [id number] Yes Take 0.5 tablets (0.1 mg total) by mouth 2 (two) times daily. May take an additional  0.1 mg (1/2 tablet of 0.2 mg) if blood pressure is great than 094 systolic  for more than an hour  Patient taking differently: Take 0.1 mg by mouth 2 (two) times daily as needed. if blood pressure is great than 076 systolic for more than an hour   [name], MD Taking Active   Eszopiclone 3 MG TABS [id number] Yes TAKE 1 TABLET BY MOUTH AT BEDTIME [name], MD Taking Active   ezetimibe (ZETIA) 10 MG tablet [id number] Yes TAKE 1 TABLET(10 MG) BY MOUTH DAILY [name], MD Taking Active   famotidine (PEPCID) 20 MG tablet [id number] No Take 20 mg by mouth as needed.   Patient not taking: Reported on 03/29/2021   [provider] Not Taking Active Self  feeding supplement, GLUCERNA SHAKE, (GLUCERNA SHAKE) LIQD [id number] No Take 237 mLs by mouth daily.  Patient not taking: Reported on 03/29/2021   [provider] Not Taking Active   FLUoxetine (PROZAC) 40 MG capsule [id number] Yes TAKE 1 CAPSULE(40 MG) BY MOUTH TWICE DAILY  Patient taking differently: Take 60 mg by mouth daily.  [name], MD Taking Active    levalbuterol [facility]) 45 MCG/ACT inhaler [id number] Yes Inhale 1-2 puffs into the lungs every 8 (eight) hours as needed for wheezing. Janith Lima, MD Taking Active   levothyroxine (SYNTHROID) 125 MCG tablet [id number] Yes TAKE 1 TABLET(125 MCG) BY MOUTH DAILY [name], MD Taking Active   MINIVELLE 0.05 MG/24HR patch [id number] Yes Place 1 patch onto the skin 2 (two) times a week.  [provider] Taking Active Self           Med Note Gaynell Face Dec 21, 2018  9:21 PM)    montelukast (SINGULAIR) 10 MG tablet [id number] Yes TAKE 1 TABLET(10 MG) BY MOUTH AT BEDTIME [name], MD Taking Active   MYRBETRIQ 50 MG TB24 tablet [id number] Yes TAKE 1 TABLET(50 MG) BY MOUTH DAILY [name], MD Taking Active   nebivolol (BYSTOLIC) 10 MG tablet [id number] Yes TAKE 1 TABLET(10 MG) BY MOUTH DAILY AT 8 PM [name], MD Taking Active   omeprazole (PRILOSEC) 20 MG capsule [id number] No Take 20 mg by mouth daily as needed.  Patient not taking: Reported on 03/29/2021   [provider] Not Taking Active   Respiratory Therapy Supplies (FLUTTER) DEVI [id number] No Use as directed  Patient not taking: Reported on 03/29/2021   Tanda Rockers, MD Not Taking Active Self  rivastigmine (EXELON) 1.5 MG capsule [id number] Yes Take 1 capsule (1.5 mg total) by mouth 2 (two) times daily. Cameron Sprang, MD Taking Active           Patient Active Problem List   Diagnosis Date Noted  . Memory loss 07/15/2020  . Malnutrition ([place]) 12/22/2018  . Hypokalemia 12/22/2018  . Hypomagnesemia 12/22/2018  . Hypophosphatemia 12/22/2018  . Anxiety   . Prolonged QT interval 12/21/2018  . Hyponatremia 12/21/2018  . Hyperlipidemia with target LDL less than 100 10/02/2018  . Encounter for chronic pain management 11/28/2017  . Dyspnea on exertion 06/01/2017  . Mild aortic stenosis 05/27/2017  . Routine general medical examination at a health care facility  07/09/2016  . Recurrent falls 03/28/2016  . Rosacea 08/18/2015  . Lumbar radiculopathy 07/07/2015  . Urinary incontinence 06/20/2015  . Hemorrhoids 10/06/2014  . Cough variant asthma vs UACS 08/03/2014  . Labile hypertension 02/22/2014  . Insomnia secondary to chronic pain 07/09/2012  . B12 deficiency 04/03/2011  . Unspecified vitamin D deficiency 07/25/2010  . ANEMIA, IRON DEFICIENCY 04/25/2009  . Allergic rhinitis 02/01/2009  . Mild dilation of ascending aorta (H[facility]) 12/27/2008  . Chronic fatigue syndrome 04/21/2008  . Major depression in partial remission ([place]) 01/19/2008  . Osteoarthritis 01/19/2008  . Hypothyroidism 03/13/2007  . Asthma 03/13/2007  . Fibromyalgia 03/13/2007    Immunization History  Administered Date(s) Administered  . Fluad Quad(high Dose 65+) 07/14/2020  . Hep A / Hep B 02/24/2018, 06/25/2018  . Hepatitis B, adult 03/26/2018  . Influenza Split 09/12/2011, 08/19/2012  . Influenza Whole 08/20/2007, 07/29/2008, 08/07/2009, 07/25/2010  . Influenza, High Dose Seasonal PF 07/09/2016, 07/14/2017, 07/24/2018  . Influenza,inj,Quad PF,6+ Mos 07/19/2013, 08/16/2014, 07/13/2015  . Influenza-Unspecified 07/15/2019  . PFIZER(Purple Top)SARS-COV-2 Vaccination 11/17/2019, 12/08/2019  . Pneumococcal Conjugate-13 01/06/2015  . Pneumococcal Polysaccharide-23 07/19/2013  . Tdap 07/29/2011  . Zoster, Live 10/28/2012    Conditions to be addressed/monitored:  Hypertension, Hyperlipidemia, GERD, Asthma, Hypothyroidism, Depression and Anxiety  Care Plan : CCM Care Plan  Updates made by Tomasa Blase, [place] since 03/29/2021 12:00 AM  Problem: HTN, HLD, Hypothyroidism, Chronic Pain, OAB, GERD, Asthma, Depression, Anxiety, Insomnia, Memory Loss   Priority: High  Onset Date: 03/29/2021    Long-Range Goal: Disease Management   Start Date: 03/29/2021  Expected End Date: 09/28/2021  This Visit's Progress: On track  Priority: High  Note:   Current Barriers:  . Unable to  independently monitor therapeutic efficacy . Does not adhere to prescribed medication regimen  Pharmacist Clinical Goal(s):  [name] Kitchen Patient will verbalize ability to afford treatment regimen . achieve adherence to monitoring guidelines and medication adherence to achieve therapeutic efficacy . achieve ability to self administer medications as prescribed through use of bubble packaging / blister packages from pharmacy  as evidenced by patient report through collaboration with PharmD and provider.   Interventions: . 1:1 collaboration with [name], MD regarding development and update of comprehensive plan of care as evidenced by provider attestation and co-signature . Inter-disciplinary care team collaboration (see longitudinal plan of care) . Comprehensive medication review performed; medication list updated in electronic medical record  Hypertension (BP goal <140/90) -Not ideally controlled -Current treatment: . Amlodipine 22m daily  . Clonidine 0.173mtwice daily as needed - for systolic blood pressure >1>009mHg . Nebivolol 1038maily  -Medications previously tried: Spironolactone, valsartan, verapamil , hydralazine, irbesartan, lisinopril  -Current home readings: 140-150/70-90 - at times can elevate to 200/130 - happening about once weekly  -Current dietary habits: reports to diet that is low in sodium and caffeine  -Current exercise habits: none at this time  -Denies hypotensive/hypertensive symptoms -Educated on BP goals and benefits of medications for prevention of heart attack, stroke and kidney damage; Daily salt intake goal < 2300 mg; Importance of home blood pressure monitoring; -Counseled to monitor BP at home daily to every other day, document, and provide log at future appointments -Counseled on diet and exercise extensively Recommended to continue current medication  Hyperlipidemia: (LDL goal < 100) -Not ideally controlled   - Last LDL 146 mg/dL -  11/21/2020 -Current treatment: . Nexletol 180m41mily - patient unsure if she is taking - possible may be an issues due to cost  . Ezetimibe 10mg86mly -Medications previously tried: rosuvastatin   -Current dietary patterns: reports to eating fish at least 1-2 times weekly, notes to eating chicken on occasion, red meat throughout the week, avoids fried foods  -Current exercise habits: none at this time  -Educated on Cholesterol goals;  Importance of limiting foods high in cholesterol; Exercise goal of 150 minutes per week; -Recommended for patient to check if she is taking nexletol when she returns home, if she is not taking due to cost / needing a prior authorization, will reach out to clinic so that PA or PAP can be completed   Asthma(Goal: control symptoms and prevent exacerbations) -Controlled -Current treatment  . Xopenex 45mcg74m - 1-2 puffs every 8 hours as needed  . Monelukast 10mg d100m  -Medications previously tried: symbicort, proair  -Exacerbations requiring treatment in last 6 months: n/a -Frequency of rescue inhaler use: 2-3 time weekly  -Counseled on Proper inhaler technique; When to use rescue inhaler -Recommended to continue current medication  Depression/Anxiety/ Insomnia (Goal: Mood control / prevention of anxiety attacks/ improved sleep quality ) -Controlled -Current treatment: . Fluoxetine 60mg da37m . Alprazolam 0.25mg - 177mlet daily as needed -  Taking no more than 2 times a week  . eszopiclone 3mg at be54mme  -Medications previously tried/failed: doxepin, aripiprazole  -Patient is following with Psychology at this time  -  Educated on Benefits of medication for symptom control Benefits of cognitive-behavioral therapy with or without medication -Recommended to continue current medication   Hypothyroidism(Goal: Maintenance of TSH) -Controlled  - Last TSH level: 1.51 uIU/mL - 11/01/2020 -Current treatment  . Levothyroxine 15mg daily  -Medications  previously tried: n/a  -Recommended to continue current medication  GERD (Goal: acid control) -Controlled  -Current treatment  . Omeprazole 294mdaily - only takes as needed - not currently taking  . Famotidine 2043maily - not currently taking  -Medications previously tried: n/a  -Recommended to continue current medication   Memory Loss (Goal: prevention of disease progression / maintenance of cognitive function) -Controlled -Current treatment  . Rivastigmine 1.5mg45mice daily  -Medications previously tried: donepezil   -Recommended to continue current medication Counseled on cognitive impairing effects that can be caused by soma and alprazolam - discussed with patient about using alternative medications / using medications in lowest doses needed for effectiveness to help lower cognitive impairing effects of medications    Overactive Bladder (Goal: Control of urination status / urge) -Not ideally controlled -Current treatment  . Myrbetriq 50mg67mly  -Medications previously tried: vesicare, detrol LA, oxybutynin  -Recommended to continue current medication   Chronic Pain / Fibromyalgia  (Neck / Shoulders / Back) (Goal: Pain control / prevention of flares ) -Not ideally controlled -Current treatment  . Carisoprodol 350mg 72met - 1/2 tablet twice daily as needed  -Medications previously tried: baclofen, gabapentin, lyrica   -Recommended for patient to continue current medication for now, discussed with patient possibility of replacing carisoprodol with duloxetine (which would replace fluoxetine) - due to patient's blood pressure control being unknown at this time, will plan for patient to record blood pressure - if at goal and controlled, will discuss potentially weaning fluoxetine in favor of duloxetine which could replace need for soma   Patient Goals/Self-Care Activities . Patient will:  - take medications as prescribed focus on medication adherence by setting up with a  pharmacy for blister / med packaging  check blood pressure daily to every other day , document, and provide at future appointments collaborate with provider on medication access solutions  Follow Up Plan: Telephone follow up appointment with care management team member scheduled for: The patient has been provided with contact information for the care management team and has been advised to call with any health related questions or concerns.        Medication Assistance: Possible that patient may need PA / Assistance for NexletAvala recheck once she is home and reach out if needed   [id number] [id number]: [phone] pill box? No - discussed today, given contact information for [facility] Pt endorses 90% compliance  Care Plan and Follow Up Patient Decision:  Patient agrees to Care Plan and Follow-up.  Plan: Telephone follow up appointment with care management team member scheduled for:  1 month and The patient has been provided with contact information for the care management team and has been advised to call with any health related questions or concerns.   DanielTomasa BlasemD Clinical Pharmacist, [place]/22

## 2021-03-29 ENCOUNTER — Other Ambulatory Visit: Payer: Self-pay

## 2021-03-29 ENCOUNTER — Ambulatory Visit (INDEPENDENT_AMBULATORY_CARE_PROVIDER_SITE_OTHER): Payer: Medicare Other

## 2021-03-29 ENCOUNTER — Other Ambulatory Visit: Payer: Self-pay | Admitting: Internal Medicine

## 2021-03-29 DIAGNOSIS — M1990 Unspecified osteoarthritis, unspecified site: Secondary | ICD-10-CM

## 2021-03-29 DIAGNOSIS — F3341 Major depressive disorder, recurrent, in partial remission: Secondary | ICD-10-CM | POA: Diagnosis not present

## 2021-03-29 DIAGNOSIS — N3946 Mixed incontinence: Secondary | ICD-10-CM

## 2021-03-29 DIAGNOSIS — J4520 Mild intermittent asthma, uncomplicated: Secondary | ICD-10-CM

## 2021-03-29 DIAGNOSIS — E039 Hypothyroidism, unspecified: Secondary | ICD-10-CM | POA: Diagnosis not present

## 2021-03-29 DIAGNOSIS — R0989 Other specified symptoms and signs involving the circulatory and respiratory systems: Secondary | ICD-10-CM

## 2021-03-29 DIAGNOSIS — E785 Hyperlipidemia, unspecified: Secondary | ICD-10-CM | POA: Diagnosis not present

## 2021-03-29 DIAGNOSIS — M797 Fibromyalgia: Secondary | ICD-10-CM

## 2021-03-29 NOTE — Patient Instructions (Addendum)
Visit Information   PATIENT GOALS:  Goals Addressed            This Visit's Progress   . Manage My Medicine       Timeframe:  Long-Range Goal Priority:  High Start Date:   03/29/2021                         Expected End Date: 09/28/2021                      Follow Up Date 04/26/2021   - call for medicine refill 2 or 3 days before it runs out - keep a list of all the medicines I take; vitamins and herbals too    Why is this important?   . These steps will help you keep on track with your medicines.   Notes: Patient given contact information for [facility] to set up blister packaging to help with adherence     . Track and Manage My Blood Pressure-Hypertension       Timeframe:  Long-Range Goal Priority:  High Start Date:  03/29/2021                           Expected End Date:  09/28/2021                     Follow Up Date 04/26/2021    - check blood pressure daily - write blood pressure results in a log or diary    Why is this important?    You won't feel high blood pressure, but it can still hurt your blood vessels.   High blood pressure can cause heart or kidney problems. It can also cause a stroke.   Making lifestyle changes like losing a little weight or eating less salt will help.   Checking your blood pressure at home and at different times of the day can help to control blood pressure.   If the doctor prescribes medicine remember to take it the way the doctor ordered.   Call the office if you cannot afford the medicine or if there are questions about it.     Notes: Patient will monitor blood pressure - potential that fluoxetine could be tapered and duloxetine could be started in hopes to eliminate need for soma - as duloxetine can cause elevations in BP will monitor before starting and after starting should it be determined patient will trial        Consent to CCM Services: Marissa [name] was given information about Chronic Care Management services today  including:  1. CCM service includes personalized support from designated clinical staff supervised by her physician, including individualized plan of care and coordination with other care providers 2. 24/7 contact phone numbers for assistance for urgent and routine care needs. 3. Service will only be billed when office clinical staff spend 20 minutes or more in a month to coordinate care. 4. Only one practitioner may furnish and bill the service in a calendar month. 5. The patient may stop CCM services at any time (effective at the end of the month) by phone call to the office staff. 6. The patient will be responsible for cost sharing (co-pay) of up to 20% of the service fee (after annual deductible is met).  Patient agreed to services and verbal consent obtained.   Patient verbalizes understanding of instructions provided today and agrees to view  in [place].   Telephone follow up appointment with care management team member scheduled for: The patient has been provided with contact information for the care management team and has been advised to call with any health related questions or concerns.   Marissa [name], PharmD Clinical Pharmacist, [place]   CLINICAL CARE PLAN: Patient Care Plan: CCM Care Plan    Problem Identified: HTN, HLD, Hypothyroidism, Chronic Pain, OAB, GERD, Asthma, Depression, Anxiety, Insomnia, Memory Loss   Priority: High  Onset Date: 03/29/2021    Long-Range Goal: Disease Management   Start Date: 03/29/2021  Expected End Date: 09/28/2021  This Visit's Progress: On track  Priority: High  Note:   Current Barriers:  . Unable to independently monitor therapeutic efficacy . Does not adhere to prescribed medication regimen  Pharmacist Clinical Goal(s):  Marissa [name] Kitchen Patient will verbalize ability to afford treatment regimen . achieve adherence to monitoring guidelines and medication adherence to achieve therapeutic efficacy . achieve ability to self administer  medications as prescribed through use of bubble packaging / blister packages from pharmacy  as evidenced by patient report through collaboration with PharmD and provider.   Interventions: . 1:1 collaboration with Marissa Koch, MD regarding development and update of comprehensive plan of care as evidenced by provider attestation and co-signature . Inter-disciplinary care team collaboration (see longitudinal plan of care) . Comprehensive medication review performed; medication list updated in electronic medical record  Hypertension (BP goal <140/90) -Not ideally controlled -Current treatment: . Amlodipine 42m daily  . Clonidine 0.187mtwice daily as needed - for systolic blood pressure >1>518mHg . Nebivolol 1082maily  -Medications previously tried: Spironolactone, valsartan, verapamil , hydralazine, irbesartan, lisinopril  -Current home readings: 140-150/70-90 - at times can elevate to 200/130 - happening about once weekly  -Current dietary habits: reports to diet that is low in sodium and caffeine  -Current exercise habits: none at this time  -Denies hypotensive/hypertensive symptoms -Educated on BP goals and benefits of medications for prevention of heart attack, stroke and kidney damage; Daily salt intake goal < 2300 mg; Importance of home blood pressure monitoring; -Counseled to monitor BP at home daily to every other day, document, and provide log at future appointments -Counseled on diet and exercise extensively Recommended to continue current medication  Hyperlipidemia: (LDL goal < 100) -Not ideally controlled   - Last LDL 146 mg/dL - 11/21/2020 -Current treatment: . Nexletol 180m77mily - patient unsure if she is taking - possible may be an issues due to cost  . Ezetimibe 10mg62mly -Medications previously tried: rosuvastatin   -Current dietary patterns: reports to eating fish at least 1-2 times weekly, notes to eating chicken on occasion, red meat throughout the week,  avoids fried foods  -Current exercise habits: none at this time  -Educated on Cholesterol goals;  Importance of limiting foods high in cholesterol; Exercise goal of 150 minutes per week; -Recommended for patient to check if she is taking nexletol when she returns home, if she is not taking due to cost / needing a prior authorization, will reach out to clinic so that PA or PAP can be completed   Asthma(Goal: control symptoms and prevent exacerbations) -Controlled -Current treatment  . Xopenex 45mcg46m - 1-2 puffs every 8 hours as needed  . Monelukast 10mg d72m  -Medications previously tried: symbicort, proair  -Exacerbations requiring treatment in last 6 months: n/a -Frequency of rescue inhaler use: 2-3 time weekly  -Counseled on Proper inhaler technique; When to use rescue inhaler -Recommended  to continue current medication  Depression/Anxiety/ Insomnia (Goal: Mood control / prevention of anxiety attacks/ improved sleep quality ) -Controlled -Current treatment: . Fluoxetine 55m daily  . Alprazolam 0.2759m- 1 tablet daily as needed -  Taking no more than 2 times a week  . eszopiclone 59m14mt bedtime  -Medications previously tried/failed: doxepin, aripiprazole  -Patient is following with Psychology at this time  -Educated on Benefits of medication for symptom control Benefits of cognitive-behavioral therapy with or without medication -Recommended to continue current medication   Hypothyroidism(Goal: Maintenance of TSH) -Controlled  - Last TSH level: 1.51 uIU/mL - 11/01/2020 -Current treatment  . Levothyroxine 125m48maily  -Medications previously tried: n/a  -Recommended to continue current medication  GERD (Goal: acid control) -Controlled  -Current treatment  . Omeprazole 20mg59mly - only takes as needed - not currently taking  . Famotidine 20mg 21my - not currently taking  -Medications previously tried: n/a  -Recommended to continue current medication   Memory Loss  (Goal: prevention of disease progression / maintenance of cognitive function) -Controlled -Current treatment  . Rivastigmine 1.5mg tw62m daily  -Medications previously tried: donepezil   -Recommended to continue current medication Counseled on cognitive impairing effects that can be caused by soma and alprazolam - discussed with patient about using alternative medications / using medications in lowest doses needed for effectiveness to help lower cognitive impairing effects of medications    Overactive Bladder (Goal: Control of urination status / urge) -Not ideally controlled -Current treatment  . Myrbetriq 50mg da759m -Medications previously tried: vesicare, detrol LA, oxybutynin  -Recommended to continue current medication   Chronic Pain / Fibromyalgia  (Neck / Shoulders / Back) (Goal: Pain control / prevention of flares ) -Not ideally controlled -Current treatment  . Carisoprodol 350mg tab559m- 1/2 tablet twice daily as needed  -Medications previously tried: baclofen, gabapentin, lyrica   -Recommended for patient to continue current medication for now, discussed with patient possibility of replacing carisoprodol with duloxetine (which would replace fluoxetine) - due to patient's blood pressure control being unknown at this time, will plan for patient to record blood pressure - if at goal and controlled, will discuss potentially weaning fluoxetine in favor of duloxetine which could replace need for soma   Patient Goals/Self-Care Activities . Patient will:  - take medications as prescribed focus on medication adherence by setting up with a pharmacy for blister / med packaging  check blood pressure daily to every other day , document, and provide at future appointments collaborate with provider on medication access solutions  Follow Up Plan: Telephone follow up appointment with care management team member scheduled for: The patient has been provided with contact information for the care  management team and has been advised to call with any health related questions or concerns.       Hypertension, Adult Hypertension is another name for high blood pressure. High blood pressure forces your heart to work harder to pump blood. This can cause problems over time. There are two numbers in a blood pressure reading. There is a top number (systolic) over a bottom number (diastolic). It is best to have a blood pressure that is below 120/80. Healthy choices can help lower your blood pressure, or you may need medicine to help lower it. What are the causes? The cause of this condition is not known. Some conditions may be related to high blood pressure. What increases the risk?  Smoking.  Having type 2 diabetes mellitus, high cholesterol, or both.  Not getting enough exercise or physical activity.  Being overweight.  Having too much fat, sugar, calories, or salt (sodium) in your diet.  Drinking too much alcohol.  Having long-term (chronic) kidney disease.  Having a family history of high blood pressure.  Age. Risk increases with age.  Race. You may be at higher risk if you are African American.  Gender. Men are at higher risk than women before age 56. After age 61, women are at higher risk than men.  Having obstructive sleep apnea.  Stress. What are the signs or symptoms?  High blood pressure may not cause symptoms. Very high blood pressure (hypertensive crisis) may cause: ? Headache. ? Feelings of worry or nervousness (anxiety). ? Shortness of breath. ? Nosebleed. ? A feeling of being sick to your stomach (nausea). ? Throwing up (vomiting). ? Changes in how you see. ? Very bad chest pain. ? Seizures. How is this treated?  This condition is treated by making healthy lifestyle changes, such as: ? Eating healthy foods. ? Exercising more. ? Drinking less alcohol.  Your health care provider may prescribe medicine if lifestyle changes are not enough to get your  blood pressure under control, and if: ? Your top number is above 130. ? Your bottom number is above 80.  Your personal target blood pressure may vary. Follow these instructions at home: Eating and drinking  If told, follow the DASH eating plan. To follow this plan: ? Fill one half of your plate at each meal with fruits and vegetables. ? Fill one fourth of your plate at each meal with whole grains. Whole grains include whole-wheat pasta, brown rice, and whole-grain bread. ? Eat or drink low-fat dairy products, such as skim milk or low-fat yogurt. ? Fill one fourth of your plate at each meal with low-fat (lean) proteins. Low-fat proteins include fish, chicken without skin, eggs, beans, and tofu. ? Avoid fatty meat, cured and processed meat, or chicken with skin. ? Avoid pre-made or processed food.  Eat less than 1,500 mg of salt each day.  Do not drink alcohol if: ? Your doctor tells you not to drink. ? You are pregnant, may be pregnant, or are planning to become pregnant.  If you drink alcohol: ? Limit how much you use to:  0-1 drink a day for women.  0-2 drinks a day for men. ? Be aware of how much alcohol is in your drink. In the [place] one drink equals one 12 oz bottle of beer (355 mL), one 5 oz glass of wine (148 mL), or one 1 oz glass of hard liquor (44 mL).   Lifestyle  Work with your doctor to stay at a healthy weight or to lose weight. Ask your doctor what the best weight is for you.  Get at least 30 minutes of exercise most days of the week. This may include walking, swimming, or biking.  Get at least 30 minutes of exercise that strengthens your muscles (resistance exercise) at least 3 days a week. This may include lifting weights or doing Pilates.  Do not use any products that contain nicotine or tobacco, such as cigarettes, e-cigarettes, and chewing tobacco. If you need help quitting, ask your doctor.  Check your blood pressure at home as told by your doctor.  Keep  all follow-up visits as told by your doctor. This is important.   Medicines  Take over-the-counter and prescription medicines only as told by your doctor. Follow directions carefully.  Do not skip doses of  blood pressure medicine. The medicine does not work as well if you skip doses. Skipping doses also puts you at risk for problems.  Ask your doctor about side effects or reactions to medicines that you should watch for. Contact a doctor if you:  Think you are having a reaction to the medicine you are taking.  Have headaches that keep coming back (recurring).  Feel dizzy.  Have swelling in your ankles.  Have trouble with your vision. Get help right away if you:  Get a very bad headache.  Start to feel mixed up (confused).  Feel weak or numb.  Feel faint.  Have very bad pain in your: ? Chest. ? Belly (abdomen).  Throw up more than once.  Have trouble breathing. Summary  Hypertension is another name for high blood pressure.  High blood pressure forces your heart to work harder to pump blood.  For most people, a normal blood pressure is less than 120/80.  Making healthy choices can help lower blood pressure. If your blood pressure does not get lower with healthy choices, you may need to take medicine. This information is not intended to replace advice given to you by your health care provider. Make sure you discuss any questions you have with your health care provider. Document Revised: 06/24/2018 Document Reviewed: 06/24/2018 Elsevier Patient Education  2021 [employer].

## 2021-04-01 ENCOUNTER — Encounter: Payer: Self-pay | Admitting: Internal Medicine

## 2021-04-04 ENCOUNTER — Telehealth: Payer: Self-pay | Admitting: Cardiology

## 2021-04-04 DIAGNOSIS — Z79899 Other long term (current) drug therapy: Secondary | ICD-10-CM

## 2021-04-04 NOTE — Telephone Encounter (Signed)
That would be fine to do additional labs'   [name]    Patient made aware, lab orders have been placed. Advised patient to call back to office with any issues, questions, or concerns. Patient verbalized understanding.

## 2021-04-04 NOTE — Telephone Encounter (Signed)
Patient wanted to know if Dr. Ellyn Hack could order her a  BMET and Liver enzyme to be done in addition to her Lipid profile.   Please let her know what the office decides

## 2021-04-04 NOTE — Telephone Encounter (Signed)
Returned call to patient who state that she would like to know if it would be okay to add liver and BMET to her upcoming Lipid Panel. Advised patient that I would forward message to Dr. Ellyn Hack for him to advise. Patient verbalized understanding.

## 2021-04-09 DIAGNOSIS — Z79899 Other long term (current) drug therapy: Secondary | ICD-10-CM | POA: Diagnosis not present

## 2021-04-09 DIAGNOSIS — E785 Hyperlipidemia, unspecified: Secondary | ICD-10-CM | POA: Diagnosis not present

## 2021-04-09 LAB — COMPREHENSIVE METABOLIC PANEL
ALT: 17 IU/L (ref 0–32)
AST: 20 IU/L (ref 0–40)
Albumin/Globulin Ratio: 2.1 (ref 1.2–2.2)
Albumin: 4.2 g/dL (ref 3.7–4.7)
Alkaline Phosphatase: 71 IU/L (ref 44–121)
BUN/Creatinine Ratio: 13 (ref 12–28)
BUN: 10 mg/dL (ref 8–27)
Bilirubin Total: 0.3 mg/dL (ref 0.0–1.2)
CO2: 21 mmol/L (ref 20–29)
Calcium: 9 mg/dL (ref 8.7–10.3)
Chloride: 97 mmol/L (ref 96–106)
Creatinine, Ser: 0.79 mg/dL (ref 0.57–1.00)
Globulin, Total: 2 g/dL (ref 1.5–4.5)
Glucose: 88 mg/dL (ref 65–99)
Potassium: 4.1 mmol/L (ref 3.5–5.2)
Sodium: 132 mmol/L — ABNORMAL LOW (ref 134–144)
Total Protein: 6.2 g/dL (ref 6.0–8.5)
eGFR: 77 mL/min/{1.73_m2} (ref >59)

## 2021-04-09 LAB — LIPID PANEL
Chol/HDL Ratio: 2.8 ratio (ref 0.0–4.4)
Cholesterol, Total: 218 mg/dL — ABNORMAL HIGH (ref 100–199)
HDL: 79 mg/dL (ref >39)
LDL Chol Calc (NIH): 123 mg/dL — ABNORMAL HIGH (ref 0–99)
Triglycerides: 90 mg/dL (ref 0–149)
VLDL Cholesterol Cal: 16 mg/dL (ref 5–40)

## 2021-04-10 NOTE — Telephone Encounter (Signed)
Patient came to office 04/09/21 to have labs drawn .

## 2021-04-11 ENCOUNTER — Telehealth: Payer: Self-pay | Admitting: *Deleted

## 2021-04-11 NOTE — Telephone Encounter (Signed)
-----   Message from [name], MD sent at 04/10/2021 10:05 AM EDT ----- Overall cholesterol level has improved.  HDL is stable.  LDL has improved some from 146 down to 123.  But not where it was a year ago.  We need to determine what dosage of medications she is currently taking in order to know what neck steps to make.  [name], MD

## 2021-04-11 NOTE — Telephone Encounter (Signed)
Spoke to patient . She states she is taking  Nexletol 180 mg  and generic Zetia  10 mg daily .  Patient states she was just diagnosis with cognitive  disorder.    Patient aware will defer to Dr Ellyn Hack to see if medication needs to adjusted.patient verbalized understanding.

## 2021-04-20 NOTE — Telephone Encounter (Signed)
Called left detail message on voicemail per dpr. No changes with current medication - continue with taking generic Zetia and Nexletol . Any question may call back

## 2021-04-24 ENCOUNTER — Telehealth (INDEPENDENT_AMBULATORY_CARE_PROVIDER_SITE_OTHER): Payer: Medicare Other | Admitting: Family Medicine

## 2021-04-24 DIAGNOSIS — U071 COVID-19: Secondary | ICD-10-CM | POA: Diagnosis not present

## 2021-04-24 DIAGNOSIS — R0981 Nasal congestion: Secondary | ICD-10-CM

## 2021-04-24 MED ORDER — BENZONATATE 100 MG PO CAPS: 100.0000 mg | ORAL_CAPSULE | Freq: Three times a day (TID) | ORAL | 0 refills | Status: DC | PRN

## 2021-04-24 MED ORDER — DOXYCYCLINE HYCLATE 100 MG PO TABS: 100.0000 mg | ORAL_TABLET | Freq: Two times a day (BID) | ORAL | 0 refills | Status: DC

## 2021-04-24 NOTE — Progress Notes (Signed)
Virtual Visit via Video Note  I connected with Pricilla Holm  on 04/24/21 at 10:20 AM EDT by a video enabled telemedicine application and verified that I am speaking with the correct person using two identifiers.  Location patient: home, [place] Location provider:work or home office Persons participating in the virtual visit: patient, provider  I discussed the limitations of evaluation and management by telemedicine and the availability of in person appointments. The patient expressed understanding and agreed to proceed.   HPI:  Acute telemedicine visit for : -Onset: 04/15/21; she finally did a covid test yesterday and it was positive -Symptoms include: sinus issue and sinus discomfort - has been doing ok, but the sinus stuff did not cleared up and now has some blood in the nasal mucus and sinus discomfort in to the upper teeth - improving a little now -Denies: fevers, CP, SOB, NVD, inability to eat/drink/get out of bed -Pertinent past medical history: hx of getting sinus infections when sick in the past -Pertinent medication allergies:  Allergies  Allergen Reactions   Fentanyl Shortness Of Breath and Rash   Rosuvastatin Anaphylaxis, Anxiety, Hives, Itching, Nausea And Vomiting, Other (See Comments), Palpitations, Photosensitivity and Shortness Of Breath   Amoxicillin Diarrhea    Severe diarrhea, rash to vaginal area with swelling    Hctz [Hydrochlorothiazide] Other (See Comments)    HypoNatremia   Hydralazine     Weak, sweats, red skin   Codeine Other (See Comments)    makes her hyper   Conjugated Estrogens Itching and Rash   Erythromycin Rash    had a rash with emycin, has done ok with other meds in it's class   Piroxicam Itching and Rash    Feldene  -COVID-19 vaccine status: 2 doses and 2 boosters  ROS: See pertinent positives and negatives per HPI.  Past Medical History:  Diagnosis Date   Arthritis    Asthma    Bicuspid aortic valve 05/2017   Likely functional bicuspid aortic  valve with sclerosis and no stenosis.   Bursitis    Cataract    mild   Dysthymia    Hemorrhoids    Hx of ulcerative colitis    per dr Arnoldo Morale as per pt.   Hyperlipidemia    Hypertension    managed - labile.   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 Roxan Hockey)   Thyroid disease     Past Surgical History:  Procedure Laterality Date   ABDOMINAL HYSTERECTOMY     BREAST EXCISIONAL BIOPSY Left    BUNIONECTOMY     Cardiac Event Monitor  07/2017   Overall relatively normal.  Normal sinus rhythm with rare bradycardia and tachycardia.  Heart rate ranged from 55-110 bpm.  Occasional PACs and PVCs, every single 1 was felt.  No arrhythmias other than one short 4 beat run of PACs.   COLONOSCOPY  02-04-2005   all normal    FOOT SURGERY     3 pins in toes    HAND SURGERY     left thumb joint resection   nasal revision     NM MYOVIEW LTD  07/2017   LOW RISK study.  No ischemia or infarction.  EF greater than 65%.  SINUS IRRIGATION     TONSILLECTOMY AND ADENOIDECTOMY     TRANSTHORACIC ECHOCARDIOGRAM  06/'1, 8/'19   a) Moderate LVH. Normal EF 60-65%. Normal diastolic parameters. --> Difficult to fully visualize the aortic valve. Cannot exclude bicuspid valve. Mild aortic stenosis noted. No PFO. Mildly dilated left atrium. Trivial MR. No comment on mitral valve prolapse. Moderately dilated ascending aorta.;; b) F/u Echo To evaluate the aortic valve. -- Bicuspid AoV - mildly thickened / calcified. - No stenosis   TRANSTHORACIC ECHOCARDIOGRAM  11/2018   Normal LV size and function.  EF 60-65%.  GR 1 DD.  Normal RV size and function.  Moderate aortic valve calcification with mild Aortic Stenosis.  Normal LV size and function.  EF 60-65%.  GR 1 DD.  Normal RV size and  function.  Moderate aortic calcification with mild stenosis.  Mild dilation of ascending aorta ~4.2 mm   TRANSTHORACIC ECHOCARDIOGRAM  08/15/2020   EF 60 to 65%.  No R WMA.  GRII DD.  Mild Aortic Stenosis (mean gradient 10 mmHg).  Moderate to severe aortic root dilation (45 mm) normal IVC.  (Stable compared to February 2020)      Current Outpatient Medications:    ALPRAZolam (XANAX) 0.25 MG tablet, TAKE 1 TABLET(0.25 MG) BY MOUTH DAILY AS NEEDED FOR ANXIETY, Disp: 30 tablet, Rfl: 5   amLODipine (NORVASC) 10 MG tablet, TAKE 1 TABLET(10 MG) BY MOUTH DAILY, Disp: 90 tablet, Rfl: 1   Bempedoic Acid (NEXLETOL) 180 MG TABS, Take 180 mg by mouth daily., Disp: 90 tablet, Rfl: 3   carisoprodol (SOMA) 350 MG tablet, TAKE 1 TABLET(350 MG) BY MOUTH THREE TIMES DAILY AS NEEDED FOR MUSCLE SPASMS, Disp: 60 tablet, Rfl: 2   cloNIDine (CATAPRES) 0.2 MG tablet, Take 0.5 tablets (0.1 mg total) by mouth 2 (two) times daily. May take an additional  0.1 mg (1/2 tablet of 0.2 mg) if blood pressure is great than 417 systolic  for more than an hour (Patient taking differently: Take 0.1 mg by mouth 2 (two) times daily as needed. if blood pressure is great than 408 systolic for more than an hour), Disp: 60 tablet, Rfl: 1   Eszopiclone 3 MG TABS, TAKE 1 TABLET BY MOUTH AT BEDTIME, Disp: 90 tablet, Rfl: 1   ezetimibe (ZETIA) 10 MG tablet, TAKE 1 TABLET(10 MG) BY MOUTH DAILY, Disp: 90 tablet, Rfl: 3   famotidine (PEPCID) 20 MG tablet, Take 20 mg by mouth as needed.  (Patient not taking: Reported on 03/29/2021), Disp: , Rfl:    feeding supplement, GLUCERNA SHAKE, (GLUCERNA SHAKE) LIQD, Take 237 mLs by mouth daily. (Patient not taking: Reported on 03/29/2021), Disp: , Rfl:    FLUoxetine (PROZAC) 40 MG capsule, TAKE 1 CAPSULE(40 MG) BY MOUTH TWICE DAILY (Patient taking differently: Take 60 mg by mouth daily.), Disp: 180 capsule, Rfl: 0   levalbuterol (XOPENEX HFA) 45 MCG/ACT inhaler, Inhale 1-2 puffs into the lungs every 8 (eight)  hours as needed for wheezing., Disp: 1 Inhaler, Rfl: 2   levothyroxine (SYNTHROID) 125 MCG tablet, TAKE 1 TABLET(125 MCG) BY MOUTH DAILY, Disp: 90 tablet, Rfl: 1   MINIVELLE 0.05 MG/24HR patch, Place 1 patch onto the skin 2 (two) times a week. , Disp: , Rfl: 3   montelukast (SINGULAIR) 10 MG tablet, TAKE 1 TABLET(10 MG) BY MOUTH AT BEDTIME, Disp: 90 tablet, Rfl: 1   MYRBETRIQ 50 MG TB24 tablet, TAKE 1 TABLET(50 MG) BY MOUTH DAILY, Disp: 90 tablet, Rfl: 1   nebivolol (BYSTOLIC) 10 MG tablet,  TAKE 1 TABLET(10 MG) BY MOUTH DAILY AT 8 PM, Disp: 90 tablet, Rfl: 3   omeprazole (PRILOSEC) 20 MG capsule, Take 20 mg by mouth daily as needed. (Patient not taking: Reported on 03/29/2021), Disp: , Rfl:    Respiratory Therapy Supplies (FLUTTER) DEVI, Use as directed (Patient not taking: Reported on 03/29/2021), Disp: 1 each, Rfl: 0   rivastigmine (EXELON) 1.5 MG capsule, Take 1 capsule (1.5 mg total) by mouth 2 (two) times daily., Disp: 60 capsule, Rfl: 2  EXAM:  VITALS per patient if applicable:  GENERAL: alert, oriented, appears well and in no acute distress  HEENT: atraumatic, conjunttiva clear, no obvious abnormalities on inspection of external nose and ears  NECK: normal movements of the head and neck  LUNGS: on inspection no signs of respiratory distress, breathing rate appears normal, no obvious gross SOB, gasping or wheezing  CV: no obvious cyanosis  MS: moves all visible extremities without noticeable abnormality  PSYCH/NEURO: pleasant and cooperative, no obvious depression or anxiety, speech and thought processing grossly intact  ASSESSMENT AND PLAN:  Discussed the following assessment and plan:  COVID-19  Sinus congestion   Discussed treatment options, ideal treatment window, potential complications, isolation and precautions for COVID-19.  She is out of the window for treatment for covid and feels is turning the corner. She feels she may have a sinus infection. We opted for nasal  saline and delayed doxy if the sinus issues are not improving. Also tessalon sent for cough. Other symptomatic care measures summarized in patient instructions. Advised to seek prompt in person care if worsening, new symptoms arise, or if is not improving with treatment. Discussed options for inperson care if PCP office not available. Did let this patient know that I only do telemedicine on Tuesdays and Thursdays for [place]. Advised to schedule follow up visit with PCP or [facility] if any further questions or concerns to avoid delays in care.   I discussed the assessment and treatment plan with the patient. The patient was provided an opportunity to ask questions and all were answered. The patient agreed with the plan and demonstrated an understanding of the instructions.     Lucretia Kern, DO

## 2021-04-24 NOTE — Patient Instructions (Addendum)
  HOME CARE TIPS:  -I sent the medication(s) we discussed to your pharmacy: Meds ordered this encounter  Medications   doxycycline (VIBRA-TABS) 100 MG tablet    Sig: Take 1 tablet (100 mg total) by mouth 2 (two) times daily.    Dispense:  20 tablet    Refill:  0   benzonatate (TESSALON PERLES) 100 MG capsule    Sig: Take 1 capsule (100 mg total) by mouth 3 (three) times daily as needed.    Dispense:  20 capsule    Refill:  0   -can use nasal saline a few times per day if you have nasal congestion  -stay hydrated, drink plenty of fluids and eat small healthy meals - avoid dairy  -can take 1000 IU (49mg) Vit D3 and 100-500 mg of Vit C daily per instructions  -follow up with your doctor in 2-3 days unless improving and feeling better  -stay home while sick, except to seek medical care. If you have COVID19, ideally it would be best to stay home for a full 10 days since the onset of symptoms PLUS one day of no fever and feeling better. Wear a good mask that fits snugly (such as N95 or KN95) if around others to reduce the risk of transmission.  It was nice to meet you today, and I really hope you are feeling better soon. I help [facility] out with telemedicine visits on Tuesdays and Thursdays and am available for visits on those days. If you have any concerns or questions following this visit please schedule a follow up visit with your Primary Care doctor or seek care at a local urgent care clinic to avoid delays in care.    Seek in person care or schedule a follow up video visit promptly if your symptoms worsen, new concerns arise or you are not improving with treatment. Call 911 and/or seek emergency care if your symptoms are severe or life threatening.

## 2021-04-25 ENCOUNTER — Other Ambulatory Visit: Payer: Self-pay | Admitting: Internal Medicine

## 2021-04-26 ENCOUNTER — Other Ambulatory Visit: Payer: Self-pay

## 2021-04-26 ENCOUNTER — Ambulatory Visit: Payer: Medicare Other

## 2021-04-26 DIAGNOSIS — M1990 Unspecified osteoarthritis, unspecified site: Secondary | ICD-10-CM

## 2021-04-26 DIAGNOSIS — E039 Hypothyroidism, unspecified: Secondary | ICD-10-CM | POA: Diagnosis not present

## 2021-04-26 DIAGNOSIS — E785 Hyperlipidemia, unspecified: Secondary | ICD-10-CM | POA: Diagnosis not present

## 2021-04-26 DIAGNOSIS — R0989 Other specified symptoms and signs involving the circulatory and respiratory systems: Secondary | ICD-10-CM | POA: Diagnosis not present

## 2021-04-26 DIAGNOSIS — F3341 Major depressive disorder, recurrent, in partial remission: Secondary | ICD-10-CM | POA: Diagnosis not present

## 2021-04-26 DIAGNOSIS — M797 Fibromyalgia: Secondary | ICD-10-CM

## 2021-04-26 NOTE — Progress Notes (Signed)
Chronic Care Management Pharmacy Note  04/26/2021 Name:  Marissa [name] MRN:  [id number] DOB:  Jul 21, 1946  Summary: - Patient notes COVID symptoms which had started around 04/15/2021 - tested positive for COVID 04/23/2021 - was seen be Dr. Maudie Mercury (televideo) 6/28.  Patient feels that she is improving, but reports that she does not have much of an appetite still, very fatigued  - Had labwork done 04/05/2021 - CMP showed slight hyponatremia at 132 mmol/L otherwise WNL  Recommendations/Changes made from today's visit: - Recommending no changes to medications at this time, will revisit possibility of rotating from fluoxetine to duloxetine, believe best to return to baseline from COVID infection before trial of new medication, would also prefer for sodium level to be rechecked prior to start of duloxetine due to possibility of further reduction in sodium levels (patient previously hospitalized for hyponatremia in 11/2018)  Subjective: Marissa [name] is an 75 y.o. year old female who is a primary patient of [name], MD.  The CCM team was consulted for assistance with disease management and care coordination needs.    Engaged with patient by telephone for follow up visit in response to provider referral for pharmacy case management and/or care coordination services.   Consent to Services:  The patient was given the following information about Chronic Care Management services today, agreed to services, and gave verbal consent: 1. CCM service includes personalized support from designated clinical staff supervised by the primary care provider, including individualized plan of care and coordination with other care providers 2. 24/7 contact phone numbers for assistance for urgent and routine care needs. 3. Service will only be billed when office clinical staff spend 20 minutes or more in a month to coordinate care. 4. Only one practitioner may furnish and bill the service in a calendar month. 5.The  patient may stop CCM services at any time (effective at the end of the month) by phone call to the office staff. 6. The patient will be responsible for cost sharing (co-pay) of up to 20% of the service fee (after annual deductible is met). Patient agreed to services and consent obtained.  Patient Care Team: [name], MD as PCP - General (Internal Medicine) [name], MD as PCP - Cardiology (Cardiology) Melrose Nakayama, MD as Consulting Physician (Cardiothoracic Surgery) [name], MD as Consulting Physician (Cardiology) Tanda Rockers, MD as Consulting Physician (Pulmonary Disease) Cameron Sprang, MD as Consulting Physician (Neurology) Delice Bison Darnelle Maffucci, [facility] as Pharmacist (Pharmacist)  Recent office visits: Non since last visit    Recent consult visits: 04/24/2021 - Dr. Maudie Mercury - Televideo visit - COVID symptoms 04/15/2021 - tested 04/23/2021 - positive - sinus issues and discomfort, blood in nasal mucus - doxycycline and benzonatate ordered   Hospital visits: None in previous 6 months  Objective:  Lab Results  Component Value Date   CREATININE 0.79 04/09/2021   BUN 10 04/09/2021   GFR 42.86 (L) 11/21/2020   GFRNONAA 81 01/12/2020   GFRAA 93 01/12/2020   NA 132 (L) 04/09/2021   K 4.1 04/09/2021   CALCIUM 9.0 04/09/2021   CO2 21 04/09/2021   GLUCOSE 88 04/09/2021    Lab Results  Component Value Date/Time   GFR 42.86 (L) 11/21/2020 02:43 PM   GFR 79.17 01/06/2019 10:30 AM    Last diabetic Eye exam:  No results found for: HMDIABEYEEXA  Last diabetic Foot exam:  No results found for: HMDIABFOOTEX   Lab Results  Component Value Date   CHOL 218 (H) 04/09/2021   HDL 79 04/09/2021   LDLCALC 123 (H) 04/09/2021   LDLDIRECT 146.0 11/21/2020   TRIG 90 04/09/2021   CHOLHDL 2.8 04/09/2021    Hepatic Function Latest Ref Rng & Units 04/09/2021 11/21/2020 01/12/2020  Total Protein 6.0 - 8.5 g/dL 6.2 6.9 7.0  Albumin 3.7 - 4.7 g/dL 4.2 4.6 4.6  AST 0  - 40 IU/L 20 87(H) 33  ALT 0 - 32 IU/L 17 94(H) 28  Alk Phosphatase 44 - 121 IU/L 71 70 71  Total Bilirubin 0.0 - 1.2 mg/dL 0.3 0.5 0.5  Bilirubin, Direct 0.0 - 0.3 mg/dL - - -    Lab Results  Component Value Date/Time   TSH 1.51 11/01/2020 11:52 AM   TSH 0.265 (L) 01/12/2020 11:15 AM   FREET4 1.13 08/04/2018 02:08 PM   FREET4 1.22 08/07/2017 02:06 PM    CBC Latest Ref Rng & Units 11/21/2020 01/12/2020 12/31/2018  WBC 4.0 - 10.5 K/uL 6.3 4.3 5.0  Hemoglobin 12.0 - 15.0 g/dL 11.9(L) 12.6 10.7(L)  Hematocrit 36.0 - 46.0 % 34.8(L) 35.7 30.0(L)  Platelets 150.0 - 400.0 K/uL 178.0 267 341.0    Lab Results  Component Value Date/Time   VD25OH 42.11 11/27/2017 11:59 AM   VD25OH 16.58 (L) 08/07/2017 02:06 PM    Clinical ASCVD: No  The 10-year ASCVD risk score Mikey Bussing DC Jr., et al., 2013) is: 26.1%   Values used to calculate the score:     Age: 75 years     Sex: Female     Is Non-Hispanic African American: No     Diabetic: No     Tobacco smoker: No     Systolic Blood Pressure: 505 mmHg     Is BP treated: Yes     HDL Cholesterol: 79 mg/dL     Total Cholesterol: 218 mg/dL    Depression screen [facility] 2/9 07/27/2020 02/21/2020 07/26/2019  Decreased Interest 0 3 0  Down, Depressed, Hopeless 0 1 1  PHQ - 2 Score 0 4 1  Altered sleeping - 1 1  Tired, decreased energy - 1 2  Change in appetite - 0 1  Feeling bad or failure about yourself  - 1 0  Trouble concentrating - 3 0  Moving slowly or fidgety/restless - 2 0  Suicidal thoughts - 0 0  PHQ-9 Score - 12 5  Difficult doing work/chores - Somewhat difficult Not difficult at all  Some recent data might be hidden     Social History   Tobacco Use  Smoking Status Never  Smokeless Tobacco Never   BP Readings from Last 3 Encounters:  01/19/21 135/77  12/21/20 140/76  11/21/20 134/82   Pulse Readings from Last 3 Encounters:  01/19/21 86  12/21/20 60  11/21/20 63   Wt Readings from Last 3 Encounters:  01/19/21 142 lb 9.6 oz (64.7  kg)  12/21/20 141 lb (64 kg)  11/21/20 143 lb (64.9 kg)   BMI Readings from Last 3 Encounters:  01/19/21 24.48 kg/m  12/21/20 24.20 kg/m  11/21/20 24.55 kg/m    Assessment/Interventions: Review of patient past medical history, allergies, medications, health status, including review of consultants reports, laboratory and other test data, was performed as part of comprehensive evaluation and provision of chronic care management services.   SDOH:  (Social Determinants of Health) assessments and interventions performed: Yes  SDOH Screenings   Alcohol Screen: Low Risk    Last Alcohol Screening Score (AUDIT): 3  Depression (  PHQ2-9): Low Risk    PHQ-2 Score: 0  Financial Resource Strain: Low Risk    Difficulty of Paying Living Expenses: Not hard at all  Food Insecurity: No Food Insecurity   Worried About Charity fundraiser in the Last Year: Never true   Ran Out of Food in the Last Year: Never true  Housing: Low Risk    Last Housing Risk Score: 0  Physical Activity: Inactive   Days of Exercise per Week: 0 days   Minutes of Exercise per Session: 0 min  Social Connections: Not on file  Stress: No Stress Concern Present   Feeling of Stress : Not at all  Tobacco Use: Low Risk    Smoking Tobacco Use: Never   Smokeless Tobacco Use: Never  Transportation Needs: No Transportation Needs   Lack of Transportation (Medical): No   Lack of Transportation (Non-Medical): No    CCM Care Plan  Allergies  Allergen Reactions   Fentanyl Shortness Of Breath and Rash   Rosuvastatin Anaphylaxis, Anxiety, Hives, Itching, Nausea And Vomiting, Other (See Comments), Palpitations, Photosensitivity and Shortness Of Breath   Amoxicillin Diarrhea    Severe diarrhea, rash to vaginal area with swelling    Hctz [Hydrochlorothiazide] Other (See Comments)    HypoNatremia   Hydralazine     Weak, sweats, red skin   Codeine Other (See Comments)    makes her hyper   Conjugated Estrogens Itching and Rash    Erythromycin Rash    had a rash with emycin, has done ok with other meds in it's class   Piroxicam Itching and Rash    Feldene    Medications Reviewed Today     Reviewed by Tomasa Blase, [facility] (Pharmacist) on 03/29/21 at 1615  Med List Status: <None>   Medication Order Taking? Sig Documenting Provider Last Dose Status Informant  ALPRAZolam (XANAX) 0.25 MG tablet [id number] Yes TAKE 1 TABLET(0.25 MG) BY MOUTH DAILY AS NEEDED FOR ANXIETY [name], MD Taking Active   amLODipine (NORVASC) 10 MG tablet [id number] Yes TAKE 1 TABLET(10 MG) BY MOUTH DAILY [name], MD Taking Active   Bempedoic Acid (NEXLETOL) 180 MG TABS [id number] No Take 180 mg by mouth daily. [name], MD Unknown Active   carisoprodol (SOMA) 350 MG tablet [id number] Yes Take 1 tablet (350 mg total) by mouth 3 (three) times daily as needed for muscle spasms.  Patient taking differently: Take 175 mg by mouth 2 (two) times daily as needed for muscle spasms.   [name], MD Taking Active   cloNIDine (CATAPRES) 0.2 MG tablet [id number] Yes Take 0.5 tablets (0.1 mg total) by mouth 2 (two) times daily. May take an additional  0.1 mg (1/2 tablet of 0.2 mg) if blood pressure is great than 664 systolic  for more than an hour  Patient taking differently: Take 0.1 mg by mouth 2 (two) times daily as needed. if blood pressure is great than 403 systolic for more than an hour   [name], MD Taking Active   Eszopiclone 3 MG TABS [id number] Yes TAKE 1 TABLET BY MOUTH AT BEDTIME [name], MD Taking Active   ezetimibe (ZETIA) 10 MG tablet [id number] Yes TAKE 1 TABLET(10 MG) BY MOUTH DAILY [name], MD Taking Active   famotidine (PEPCID) 20 MG tablet [id number] No Take 20 mg by mouth as needed.   Patient not taking: Reported on 03/29/2021   [provider] Not Taking Active Self  feeding supplement,  GLUCERNA SHAKE, (GLUCERNA SHAKE) LIQD [id number] No Take 237 mLs by mouth daily.   Patient not taking: Reported on 03/29/2021   [provider] Not Taking Active   FLUoxetine (PROZAC) 40 MG capsule [id number] Yes TAKE 1 CAPSULE(40 MG) BY MOUTH TWICE DAILY  Patient taking differently: Take 60 mg by mouth daily.   [name], MD Taking Active   levalbuterol [facility]) 45 MCG/ACT inhaler [id number] Yes Inhale 1-2 puffs into the lungs every 8 (eight) hours as needed for wheezing. Janith Lima, MD Taking Active   levothyroxine (SYNTHROID) 125 MCG tablet [id number] Yes TAKE 1 TABLET(125 MCG) BY MOUTH DAILY [name], MD Taking Active   MINIVELLE 0.05 MG/24HR patch [id number] Yes Place 1 patch onto the skin 2 (two) times a week.  [provider] Taking Active Self           Med Note Gaynell Face Dec 21, 2018  9:21 PM)    montelukast (SINGULAIR) 10 MG tablet [id number] Yes TAKE 1 TABLET(10 MG) BY MOUTH AT BEDTIME [name], MD Taking Active   MYRBETRIQ 50 MG TB24 tablet [id number] Yes TAKE 1 TABLET(50 MG) BY MOUTH DAILY [name], MD Taking Active   nebivolol (BYSTOLIC) 10 MG tablet [id number] Yes TAKE 1 TABLET(10 MG) BY MOUTH DAILY AT 8 PM [name], MD Taking Active   omeprazole (PRILOSEC) 20 MG capsule [id number] No Take 20 mg by mouth daily as needed.  Patient not taking: Reported on 03/29/2021   [provider] Not Taking Active   Respiratory Therapy Supplies (FLUTTER) DEVI [id number] No Use as directed  Patient not taking: Reported on 03/29/2021   Tanda Rockers, MD Not Taking Active Self  rivastigmine (EXELON) 1.5 MG capsule [id number] Yes Take 1 capsule (1.5 mg total) by mouth 2 (two) times daily. Cameron Sprang, MD Taking Active             Patient Active Problem List   Diagnosis Date Noted   Memory loss 07/15/2020   Malnutrition ([place]) 12/22/2018   Hypokalemia 12/22/2018   Hypomagnesemia 12/22/2018   Hypophosphatemia 12/22/2018   Anxiety    Prolonged QT interval 12/21/2018    Hyponatremia 12/21/2018   Hyperlipidemia with target LDL less than 100 10/02/2018   Encounter for chronic pain management 11/28/2017   Dyspnea on exertion 06/01/2017   Mild aortic stenosis 05/27/2017   Routine general medical examination at a health care facility 07/09/2016   Recurrent falls 03/28/2016   Rosacea 08/18/2015   Lumbar radiculopathy 07/07/2015   Urinary incontinence 06/20/2015   Hemorrhoids 10/06/2014   Cough variant asthma vs UACS 08/03/2014   Labile hypertension 02/22/2014   Insomnia secondary to chronic pain 07/09/2012   B12 deficiency 04/03/2011   Unspecified vitamin D deficiency 07/25/2010   ANEMIA, IRON DEFICIENCY 04/25/2009   Allergic rhinitis 02/01/2009   Mild dilation of ascending aorta (HCC) 12/27/2008   Chronic fatigue syndrome 04/21/2008   Major depression in partial remission ([place]) 01/19/2008   Osteoarthritis 01/19/2008   Hypothyroidism 03/13/2007   Asthma 03/13/2007   Fibromyalgia 03/13/2007    Immunization History  Administered Date(s) Administered   Fluad Quad(high Dose 65+) 07/14/2020   Hep A / Hep B 02/24/2018, 06/25/2018   Hepatitis B, adult 03/26/2018   Influenza Split 09/12/2011, 08/19/2012   Influenza Whole 08/20/2007, 07/29/2008, 08/07/2009, 07/25/2010   Influenza, High Dose Seasonal PF 07/09/2016, 07/14/2017, 07/24/2018   Influenza,inj,Quad PF,6+ Mos 07/19/2013, 08/16/2014, 07/13/2015   Influenza-Unspecified 07/15/2019   PFIZER(Purple  Top)SARS-COV-2 Vaccination 11/17/2019, 12/08/2019   Pneumococcal Conjugate-13 01/06/2015   Pneumococcal Polysaccharide-23 07/19/2013   Tdap 07/29/2011   Zoster, Live 10/28/2012    Conditions to be addressed/monitored:  Hypertension, Hyperlipidemia, GERD, Asthma, Hypothyroidism, Depression and Anxiety  Care Plan : CCM Care Plan  Updates made by Tomasa Blase, RPH since 04/26/2021 12:00 AM     Problem: HTN, HLD, Hypothyroidism, Chronic Pain, OAB, GERD, Asthma, Depression, Anxiety, Insomnia,  Memory Loss   Priority: High  Onset Date: 03/29/2021     Long-Range Goal: Disease Management   Start Date: 03/29/2021  Expected End Date: 09/28/2021  This Visit's Progress: Not on track  Recent Progress: On track  Priority: High  Note:   Current Barriers:  Unable to independently monitor therapeutic efficacy Does not adhere to prescribed medication regimen  Pharmacist Clinical Goal(s):  Patient will verbalize ability to afford treatment regimen achieve adherence to monitoring guidelines and medication adherence to achieve therapeutic efficacy achieve ability to self administer medications as prescribed through use of bubble packaging / blister packages from pharmacy  as evidenced by patient report through collaboration with PharmD and provider.   Interventions: 1:1 collaboration with [name], MD regarding development and update of comprehensive plan of care as evidenced by provider attestation and co-signature Inter-disciplinary care team collaboration (see longitudinal plan of care) Comprehensive medication review performed; medication list updated in electronic medical record  Hypertension (BP goal <140/90) -Not ideally controlled -Current treatment: Amlodipine 7m daily  Clonidine 0.184mtwice daily as needed - for systolic blood pressure >1>932mHg Nebivolol 1025maily  -Medications previously tried: Spironolactone, valsartan, verapamil , hydralazine, irbesartan, lisinopril  -Current home readings: reports that blood pressures have been averaging ~140/90 -Current dietary habits: reports to diet that is low in sodium and caffeine  -Current exercise habits: none at this time  -Denies hypotensive/hypertensive symptoms -Educated on BP goals and benefits of medications for prevention of heart attack, stroke and kidney damage; Daily salt intake goal < 2300 mg; Importance of home blood pressure monitoring; -Counseled to monitor BP at home daily to every other day,  document, and provide log at future appointments -Counseled on diet and exercise extensively Recommended to continue current medication  Hyperlipidemia: (LDL goal < 100) -Not ideally controlled   - Last LDL 146 mg/dL - 11/21/2020 -Current treatment: Nexletol 180m71mily - patient unsure if she is taking - possible may be an issues due to cost  Ezetimibe 10mg12mly -Medications previously tried: rosuvastatin   -Current dietary patterns: reports to eating fish at least 1-2 times weekly, notes to eating chicken on occasion, red meat throughout the week, avoids fried foods  -Current exercise habits: none at this time  -Educated on Cholesterol goals;  Importance of limiting foods high in cholesterol; Exercise goal of 150 minutes per week; -Recommended for patient to check if she is taking nexletol when she returns home, if she is not taking due to cost / needing a prior authorization, will reach out to clinic so that PA or PAP can be completed   Asthma(Goal: control symptoms and prevent exacerbations) -Controlled -Current treatment  Xopenex 45mcg72m - 1-2 puffs every 8 hours as needed  Monelukast 10mg d80m  -Medications previously tried: symbicort, proair  -Exacerbations requiring treatment in last 6 months: n/a -Frequency of rescue inhaler use: 2-3 time weekly  -Counseled on Proper inhaler technique; When to use rescue inhaler -Recommended to continue current medication  Depression/Anxiety/ Insomnia (Goal: Mood control / prevention of anxiety attacks/ improved sleep quality ) -  Controlled -Current treatment: Fluoxetine 40m daily  Alprazolam 0.2520m- 1 tablet daily as needed -  Taking no more than 2 times a week  eszopiclone 20m22mt bedtime  -Medications previously tried/failed: doxepin, aripiprazole  -Patient is following with Psychology at this time  -Educated on Benefits of medication for symptom control Benefits of cognitive-behavioral therapy with or without  medication -Recommended to continue current medication  -Most recent eGFR 77 mL/min / CrCl: 61.58 mL/min   Hypothyroidism(Goal: Maintenance of TSH) -Controlled  - Last TSH level: 1.51 uIU/mL - 11/01/2020 -Current treatment  Levothyroxine 125m59maily  -Medications previously tried: n/a  -Recommended to continue current medication  GERD (Goal: acid control) -Controlled  -Current treatment  Omeprazole 20mg48mly - only takes as needed - not currently taking  Famotidine 20mg 420my - not currently taking  -Medications previously tried: n/a  -Recommended to continue current medication   Memory Loss (Goal: prevention of disease progression / maintenance of cognitive function) -Controlled -Current treatment  Rivastigmine 1.5mg tw120m daily  -Medications previously tried: donepezil   -Recommended to continue current medication Counseled on cognitive impairing effects that can be caused by soma and alprazolam - discussed with patient about using alternative medications / using medications in lowest doses needed for effectiveness to help lower cognitive impairing effects of medications    Overactive Bladder (Goal: Control of urination status / urge) -Not ideally controlled -Current treatment  Myrbetriq 50mg da38m -Medications previously tried: vesicare, detrol LA, oxybutynin  -Recommended to continue current medication   Chronic Pain / Fibromyalgia  (Neck / Shoulders / Back) (Goal: Pain control / prevention of flares ) -Not ideally controlled -Current treatment  Carisoprodol 350mg tab68m- 1/2 tablet twice daily as needed  -Medications previously tried: baclofen, gabapentin, lyrica   -Recommended for patient to continue current medication for now, discussed with patient possibility of replacing carisoprodol with duloxetine (which would replace fluoxetine) - due to patient's blood pressure control being unknown at this time, will plan for patient to record blood pressure - if at goal and  controlled, will discuss potentially weaning fluoxetine in favor of duloxetine which could replace need for soma   Patient Goals/Self-Care Activities Patient will:  - take medications as prescribed focus on medication adherence by setting up with a pharmacy for blister / med packaging  check blood pressure daily to every other day , document, and provide at future appointments collaborate with provider on medication access solutions  Follow Up Plan: Telephone follow up appointment with care management team member scheduled for: The patient has been provided with contact information for the care management team and has been advised to call with any health related questions or concerns.         Medication Assistance:  Possible that patient may need PA / Assistance for Nexletol,[facility] once she is home and reach out if needed   [id number] [id number] [address] [phone]? No - discussed today, given contact information for [facility] endorses 90% compliance  Care Plan and Follow Up Patient Decision:  Patient agrees to Care Plan and Follow-up.  Plan: Telephone follow up appointment with care management team member scheduled for:  1 month and The patient has been provided with contact information for the care management team and has been advised to call with  any health related questions or concerns.   Tomasa Blase, PharmD Clinical Pharmacist, [place]

## 2021-04-26 NOTE — Patient Instructions (Signed)
Visit Information  PATIENT GOALS:  Goals Addressed             This Visit's Progress    Manage My Medicine   Not on track    Timeframe:  Long-Range Goal Priority:  High Start Date:   03/29/2021                         Expected End Date: 09/28/2021                      Follow Up Date 04/26/2021   - call for medicine refill 2 or 3 days before it runs out - keep a list of all the medicines I take; vitamins and herbals too    Why is this important?   These steps will help you keep on track with your medicines.   Notes: Patient given contact information for [facility] to set up blister packaging to help with adherence       Track and Manage My Blood Pressure-Hypertension   On track    Timeframe:  Long-Range Goal Priority:  High Start Date:  03/29/2021                           Expected End Date:  09/28/2021                     Follow Up Date 04/26/2021    - check blood pressure daily - write blood pressure results in a log or diary    Why is this important?   You won't feel high blood pressure, but it can still hurt your blood vessels.  High blood pressure can cause heart or kidney problems. It can also cause a stroke.  Making lifestyle changes like losing a little weight or eating less salt will help.  Checking your blood pressure at home and at different times of the day can help to control blood pressure.  If the doctor prescribes medicine remember to take it the way the doctor ordered.  Call the office if you cannot afford the medicine or if there are questions about it.     Notes: Patient will monitor blood pressure - potential that fluoxetine could be tapered and duloxetine could be started in hopes to eliminate need for soma - as duloxetine can cause elevations in BP will monitor before starting and after starting should it be determined patient will trial          Patient verbalizes understanding of instructions provided today and agrees to view in [place].    Telephone follow up appointment with care management team member scheduled for: 1 month The patient has been provided with contact information for the care management team and has been advised to call with any health related questions or concerns.   Tomasa Blase, PharmD Clinical Pharmacist, [place]

## 2021-05-15 ENCOUNTER — Encounter: Payer: Self-pay | Admitting: Internal Medicine

## 2021-05-17 ENCOUNTER — Encounter: Payer: Self-pay | Admitting: Family Medicine

## 2021-05-17 ENCOUNTER — Other Ambulatory Visit: Payer: Self-pay

## 2021-05-17 ENCOUNTER — Telehealth (INDEPENDENT_AMBULATORY_CARE_PROVIDER_SITE_OTHER): Payer: Medicare Other | Admitting: Family Medicine

## 2021-05-17 VITALS — BP 138/70 | Wt 130.0 lb

## 2021-05-17 DIAGNOSIS — Q825 Congenital non-neoplastic nevus: Secondary | ICD-10-CM | POA: Diagnosis not present

## 2021-05-17 DIAGNOSIS — R0981 Nasal congestion: Secondary | ICD-10-CM | POA: Diagnosis not present

## 2021-05-17 DIAGNOSIS — L578 Other skin changes due to chronic exposure to nonionizing radiation: Secondary | ICD-10-CM | POA: Diagnosis not present

## 2021-05-17 DIAGNOSIS — L821 Other seborrheic keratosis: Secondary | ICD-10-CM | POA: Diagnosis not present

## 2021-05-17 DIAGNOSIS — R059 Cough, unspecified: Secondary | ICD-10-CM | POA: Diagnosis not present

## 2021-05-17 MED ORDER — BENZONATATE 100 MG PO CAPS: 100.0000 mg | ORAL_CAPSULE | Freq: Three times a day (TID) | ORAL | 0 refills | Status: DC | PRN

## 2021-05-17 NOTE — Patient Instructions (Signed)
-  please call the EAr, Nose and Throat doctor as planned for inperson evaluation  -please check with your pulmonologist as well if chronic cough or get an inperson visit with your primary care office  -in the interim can try nasal saline twice daily and flonase 2 sprays each nostril daily  -I sent the medication(s) we discussed to your pharmacy: Meds ordered this encounter  Medications   benzonatate (TESSALON PERLES) 100 MG capsule    Sig: Take 1 capsule (100 mg total) by mouth 3 (three) times daily as needed.    Dispense:  20 capsule    Refill:  0     I hope you are feeling better soon!  Seek in person care promptly if your symptoms worsen, new concerns arise or you are not improving with treatment in the interim.  It was nice to meet you today. I help Romeo out with telemedicine visits on Tuesdays and Thursdays and am available for visits on those days. If you have any concerns or questions following this visit please schedule a follow up visit with your Primary Care doctor or seek care at a local urgent care clinic to avoid delays in care.

## 2021-05-17 NOTE — Addendum Note (Signed)
Addended by: Lucretia Kern on: 05/17/2021 12:38 PM   Modules accepted: Level of Service

## 2021-05-17 NOTE — Progress Notes (Signed)
Virtual Visit via Video Note  I connected with Marissa [name]  on 05/17/21 at 11:20 AM EDT by a video enabled telemedicine application and verified that I am speaking with the correct person using two identifiers.  Location patient: home, [place] Location provider:work or home office Persons participating in the virtual visit: patient, provider, husband  I discussed the limitations of evaluation and management by telemedicine and the availability of in person appointments. The patient expressed understanding and agreed to proceed.   HPI:  Acute telemedicine visit for Sinus Issues/cough: -Onset: had covid last month and since has had some persistent sinus issues -Symptoms include: nasal congestion, thick sinus drainage from the R nostril - R sided maxillary sinus discomfort at times, PND, cough  -she took a full course of doxy but it did not resolve her symptoms - she wants to see an ENT doctor in [place] -used to see ENT at [facility] for chronic sinus issues/narrow nasal passages -Denies:fevers, NVD, CP, SOB (except with the cough at times), severe worst HA of life,  -Pertinent past medical history:sinus surgery in the past -Pertinent medication allergies: Allergies  Allergen Reactions   Fentanyl Shortness Of Breath and Rash   Rosuvastatin Anaphylaxis, Anxiety, Hives, Itching, Nausea And Vomiting, Other (See Comments), Palpitations, Photosensitivity and Shortness Of Breath   Amoxicillin Diarrhea    Severe diarrhea, rash to vaginal area with swelling    Hctz [Hydrochlorothiazide] Other (See Comments)    HypoNatremia   Hydralazine     Weak, sweats, red skin   Codeine Other (See Comments)    makes her hyper   Conjugated Estrogens Itching and Rash   Erythromycin Rash    had a rash with emycin, has done ok with other meds in it's class   Piroxicam Itching and Rash    Feldene  -COVID-19 vaccine status: vaccinated   ROS: See pertinent positives and negatives per HPI.  Past Medical History:   Diagnosis Date   Arthritis    Asthma    Bicuspid aortic valve 05/2017   Likely functional bicuspid aortic valve with sclerosis and no stenosis.   Bursitis    Cataract    mild   Dysthymia    Hemorrhoids    Hx of ulcerative colitis    per dr Arnoldo Morale as per pt.   Hyperlipidemia    Hypertension    managed - labile.   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 Roxan Hockey)   Thyroid disease     Past Surgical History:  Procedure Laterality Date   ABDOMINAL HYSTERECTOMY     BREAST EXCISIONAL BIOPSY Left    BUNIONECTOMY     Cardiac Event Monitor  07/2017   Overall relatively normal.  Normal sinus rhythm with rare bradycardia and tachycardia.  Heart rate ranged from 55-110 bpm.  Occasional PACs and PVCs, every single 1 was felt.  No arrhythmias other than one short 4 beat run of PACs.   COLONOSCOPY  02-04-2005   all normal    FOOT SURGERY     3 pins in toes    HAND SURGERY     left thumb joint resection   nasal revision     NM MYOVIEW LTD  07/2017   LOW  RISK study.  No ischemia or infarction.  EF greater than 65%.    SINUS IRRIGATION     TONSILLECTOMY AND ADENOIDECTOMY     TRANSTHORACIC ECHOCARDIOGRAM  06/'1, 8/'19   a) Moderate LVH. Normal EF 60-65%. Normal diastolic parameters. --> Difficult to fully visualize the aortic valve. Cannot exclude bicuspid valve. Mild aortic stenosis noted. No PFO. Mildly dilated left atrium. Trivial MR. No comment on mitral valve prolapse. Moderately dilated ascending aorta.;; b) F/u Echo To evaluate the aortic valve. -- Bicuspid AoV - mildly thickened / calcified. - No stenosis   TRANSTHORACIC ECHOCARDIOGRAM  11/2018   Normal LV size and function.  EF 60-65%.  GR 1 DD.  Normal RV size and function.  Moderate aortic valve  calcification with mild Aortic Stenosis.  Normal LV size and function.  EF 60-65%.  GR 1 DD.  Normal RV size and function.  Moderate aortic calcification with mild stenosis.  Mild dilation of ascending aorta ~4.2 mm   TRANSTHORACIC ECHOCARDIOGRAM  08/15/2020   EF 60 to 65%.  No R WMA.  GRII DD.  Mild Aortic Stenosis (mean gradient 10 mmHg).  Moderate to severe aortic root dilation (45 mm) normal IVC.  (Stable compared to February 2020)      Current Outpatient Medications:    ALPRAZolam (XANAX) 0.25 MG tablet, TAKE 1 TABLET(0.25 MG) BY MOUTH DAILY AS NEEDED FOR ANXIETY, Disp: 30 tablet, Rfl: 5   amLODipine (NORVASC) 10 MG tablet, TAKE 1 TABLET(10 MG) BY MOUTH DAILY, Disp: 90 tablet, Rfl: 1   Bempedoic Acid (NEXLETOL) 180 MG TABS, Take 180 mg by mouth daily., Disp: 90 tablet, Rfl: 3   benzonatate (TESSALON PERLES) 100 MG capsule, Take 1 capsule (100 mg total) by mouth 3 (three) times daily as needed., Disp: 20 capsule, Rfl: 0   carisoprodol (SOMA) 350 MG tablet, TAKE 1 TABLET(350 MG) BY MOUTH THREE TIMES DAILY AS NEEDED FOR MUSCLE SPASMS, Disp: 60 tablet, Rfl: 2   cloNIDine (CATAPRES) 0.2 MG tablet, Take 0.5 tablets (0.1 mg total) by mouth 2 (two) times daily. May take an additional  0.1 mg (1/2 tablet of 0.2 mg) if blood pressure is great than 165 systolic  for more than an hour (Patient taking differently: Take 0.1 mg by mouth 2 (two) times daily as needed. if blood pressure is great than 537 systolic for more than an hour), Disp: 60 tablet, Rfl: 1   Eszopiclone 3 MG TABS, TAKE 1 TABLET BY MOUTH AT BEDTIME, Disp: 90 tablet, Rfl: 1   ezetimibe (ZETIA) 10 MG tablet, TAKE 1 TABLET(10 MG) BY MOUTH DAILY, Disp: 90 tablet, Rfl: 3   famotidine (PEPCID) 20 MG tablet, Take 20 mg by mouth as needed., Disp: , Rfl:    feeding supplement, GLUCERNA SHAKE, (GLUCERNA SHAKE) LIQD, Take 237 mLs by mouth daily., Disp: , Rfl:    FLUoxetine (PROZAC) 40 MG capsule, TAKE 1 CAPSULE(40 MG) BY MOUTH TWICE DAILY, Disp: 180  capsule, Rfl: 0   levalbuterol (XOPENEX HFA) 45 MCG/ACT inhaler, Inhale 1-2 puffs into the lungs every 8 (eight) hours as needed for wheezing., Disp: 1 Inhaler, Rfl: 2   levothyroxine (SYNTHROID) 125 MCG tablet, TAKE 1 TABLET(125 MCG) BY MOUTH DAILY, Disp: 90 tablet, Rfl: 1   MINIVELLE 0.05 MG/24HR patch, Place 1 patch onto the skin 2 (two) times a week. , Disp: , Rfl: 3   montelukast (SINGULAIR) 10 MG tablet, TAKE 1 TABLET(10 MG) BY MOUTH AT BEDTIME, Disp: 90 tablet, Rfl: 1   MYRBETRIQ 50 MG TB24  tablet, TAKE 1 TABLET(50 MG) BY MOUTH DAILY, Disp: 90 tablet, Rfl: 1   nebivolol (BYSTOLIC) 10 MG tablet, TAKE 1 TABLET(10 MG) BY MOUTH DAILY AT 8 PM, Disp: 90 tablet, Rfl: 3   Respiratory Therapy Supplies (FLUTTER) DEVI, Use as directed, Disp: 1 each, Rfl: 0   rivastigmine (EXELON) 1.5 MG capsule, Take 1 capsule (1.5 mg total) by mouth 2 (two) times daily., Disp: 60 capsule, Rfl: 2  EXAM:  VITALS per patient if applicable:  GENERAL: alert, oriented, appears well and in no acute distress  HEENT: atraumatic, conjunttiva clear, no obvious abnormalities on inspection of external nose and ears  NECK: normal movements of the head and neck  LUNGS: on inspection no signs of respiratory distress, breathing rate appears normal, no obvious gross SOB, gasping or wheezing  CV: no obvious cyanosis  MS: moves all visible extremities without noticeable abnormality  PSYCH/NEURO: pleasant and cooperative, no obvious depression or anxiety, speech and thought processing grossly intact  ASSESSMENT AND PLAN:  Discussed the following assessment and plan:  Nasal sinus congestion  Cough  -we discussed possible serious and likely etiologies, options for evaluation and workup, limitations of telemedicine visit vs in person visit, treatment, treatment risks and precautions. Pt prefers to treat via telemedicine empirically rather than in person at this moment.  Query chronic sinusitis, post COVID versus unresolved  sinusitis versus other.  She did not feel an antibiotic helped and she wants to see an ear nose and throat specialist.  She also sees a pulmonologist and plans to call them about the cough.  We talked about various ear nose and throat options and she prefers to call the office where her husband is seen by Dr. Constance Holster to see if they have any openings.  Discussed possible course of another antibiotic, nasal saline, possible Flonase.  She plans to try some nasal saline and Flonase while contacting ear nose and throat and her pulmonologist. She also opted for refill of tessalon for the cough.  Advised follow-up in person with her primary care office if she is not able to get in with her specialist.  She agrees to schedule this.  Advised follow-up in 1 week or sooner. Advised to seek prompt in person care if worsening, new symptoms arise, or if is not improving with treatment. Discussed options for inperson care if PCP office not available. Did let this patient know that I only do telemedicine on Tuesdays and Thursdays for [place]. Advised to schedule follow up visit with PCP or [facility] if any further questions or concerns to avoid delays in care.   I discussed the assessment and treatment plan with the patient. The patient was provided an opportunity to ask questions and all were answered. The patient agreed with the plan and demonstrated an understanding of the instructions.     Lucretia Kern, DO

## 2021-05-24 ENCOUNTER — Telehealth: Payer: Medicare Other

## 2021-05-25 ENCOUNTER — Other Ambulatory Visit: Payer: Self-pay | Admitting: Neurology

## 2021-05-28 ENCOUNTER — Telehealth: Payer: Self-pay

## 2021-05-28 NOTE — Chronic Care Management (AMB) (Signed)
Chronic Care Management Pharmacy Assistant   Name: Marissa [name]  MRN: [id number] DOB: 28-Dec-1945  Reason for Encounter: Disease State General    Recent office visits:  05/17/21-Marissa Vista Lawman, DO (Video Visit) Seen for Sinus issues and cough. Can try nasal saline twice daily and flonase 2 sprays each nostril daily. Refilled (TESSALON PERLES) 100 MG capsule.  Recent consult visits:  None noted  Hospital visits:  None in previous 6 months  Medications: Outpatient Encounter Medications as of 05/28/2021  Medication Sig   ALPRAZolam (XANAX) 0.25 MG tablet TAKE 1 TABLET(0.25 MG) BY MOUTH DAILY AS NEEDED FOR ANXIETY   amLODipine (NORVASC) 10 MG tablet TAKE 1 TABLET(10 MG) BY MOUTH DAILY   Bempedoic Acid (NEXLETOL) 180 MG TABS Take 180 mg by mouth daily.   benzonatate (TESSALON PERLES) 100 MG capsule Take 1 capsule (100 mg total) by mouth 3 (three) times daily as needed.   carisoprodol (SOMA) 350 MG tablet TAKE 1 TABLET(350 MG) BY MOUTH THREE TIMES DAILY AS NEEDED FOR MUSCLE SPASMS   cloNIDine (CATAPRES) 0.2 MG tablet Take 0.5 tablets (0.1 mg total) by mouth 2 (two) times daily. May take an additional  0.1 mg (1/2 tablet of 0.2 mg) if blood pressure is great than 063 systolic  for more than an hour (Patient taking differently: Take 0.1 mg by mouth 2 (two) times daily as needed. if blood pressure is great than 016 systolic for more than an hour)   Eszopiclone 3 MG TABS TAKE 1 TABLET BY MOUTH AT BEDTIME   ezetimibe (ZETIA) 10 MG tablet TAKE 1 TABLET(10 MG) BY MOUTH DAILY   famotidine (PEPCID) 20 MG tablet Take 20 mg by mouth as needed.   feeding supplement, GLUCERNA SHAKE, (GLUCERNA SHAKE) LIQD Take 237 mLs by mouth daily.   FLUoxetine (PROZAC) 40 MG capsule TAKE 1 CAPSULE(40 MG) BY MOUTH TWICE DAILY   levalbuterol (XOPENEX HFA) 45 MCG/ACT inhaler Inhale 1-2 puffs into the lungs every 8 (eight) hours as needed for wheezing.   levothyroxine (SYNTHROID) 125 MCG tablet TAKE 1 TABLET(125 MCG) BY  MOUTH DAILY   MINIVELLE 0.05 MG/24HR patch Place 1 patch onto the skin 2 (two) times a week.    montelukast (SINGULAIR) 10 MG tablet TAKE 1 TABLET(10 MG) BY MOUTH AT BEDTIME   MYRBETRIQ 50 MG TB24 tablet TAKE 1 TABLET(50 MG) BY MOUTH DAILY   nebivolol (BYSTOLIC) 10 MG tablet TAKE 1 TABLET(10 MG) BY MOUTH DAILY AT 8 PM   Respiratory Therapy Supplies (FLUTTER) DEVI Use as directed   rivastigmine (EXELON) 1.5 MG capsule TAKE 1 CAPSULE(1.5 MG) BY MOUTH TWICE DAILY   No facility-administered encounter medications on file as of 05/28/2021.   Have you had any problems recently with your health? Patient states she has a chronic cough since fathers day has an appointment with Marissa [name] tomorrow. Patient states she has history of her immunity and the only thing that will help is a strong antibiotic. Patient states she has an appointment with ENT until 3 weeks for this issue. Patient states she has been frustrated due to her still having the cough problem.  Have you had any problems with your pharmacy? Patient states her pharmacy delays on her controlled medications.  What issues or side effects are you having with your medications? Patient states she has no issues or side effects with her medications.  What would you like me to pass along to [name] for them to help you with?  Patient states she has a  chronic cough since fathers day patient states she has an appointment with her PCP Marissa [name] tomorrow 05/29/21. Patient states she has history of her immunity and the only thing that will help is a strong antibiotic. Patient states she has an appointment with ENT until 3 weeks for this issue. Patient states she has been frustrated due to her still having the cough problem.  What can we do to take care of you better? Patient states there is nothing at this time.   Star Rating Drugs: None noted  Marissa [name], [place]

## 2021-05-29 ENCOUNTER — Encounter: Payer: Self-pay | Admitting: Internal Medicine

## 2021-05-29 ENCOUNTER — Ambulatory Visit (INDEPENDENT_AMBULATORY_CARE_PROVIDER_SITE_OTHER): Payer: Medicare Other | Admitting: Internal Medicine

## 2021-05-29 ENCOUNTER — Other Ambulatory Visit: Payer: Self-pay

## 2021-05-29 ENCOUNTER — Ambulatory Visit (INDEPENDENT_AMBULATORY_CARE_PROVIDER_SITE_OTHER): Payer: Medicare Other

## 2021-05-29 VITALS — BP 118/60 | HR 57 | Temp 98.2°F | Resp 18 | Ht 64.0 in | Wt 134.8 lb

## 2021-05-29 DIAGNOSIS — R053 Chronic cough: Secondary | ICD-10-CM | POA: Diagnosis not present

## 2021-05-29 DIAGNOSIS — I517 Cardiomegaly: Secondary | ICD-10-CM | POA: Diagnosis not present

## 2021-05-29 DIAGNOSIS — R059 Cough, unspecified: Secondary | ICD-10-CM | POA: Diagnosis not present

## 2021-05-29 MED ORDER — AZITHROMYCIN 250 MG PO TABS: ORAL_TABLET | ORAL | 0 refills | Status: DC

## 2021-05-29 NOTE — Progress Notes (Signed)
   Subjective:   Patient ID: Marissa [name], female    DOB: December 26, 1945, 75 y.o.   MRN: [id number]  HPI The patient is a 75 YO female coming in for sinus problems. Started 7 weeks ago with covid-19 and given antibiotics along the way which did not help. Virtual visit on 05/17/21 and elected at that time to see ENT and do flonase. Refill tessalon perles done then. Still coughing more than she should and sinus pain.   Review of Systems  Constitutional:  Positive for activity change and appetite change. Negative for chills, fatigue, fever and unexpected weight change.  HENT:  Positive for congestion, postnasal drip, rhinorrhea and sinus pressure. Negative for ear discharge, ear pain, sinus pain, sneezing, sore throat, tinnitus, trouble swallowing and voice change.   Eyes: Negative.   Respiratory:  Positive for cough. Negative for chest tightness, shortness of breath and wheezing.   Cardiovascular: Negative.   Gastrointestinal: Negative.   Musculoskeletal:  Positive for myalgias.  Neurological: Negative.    Objective:  Physical Exam Constitutional:      Appearance: She is well-developed.  HENT:     Head: Normocephalic and atraumatic.     Comments: Oropharynx with redness and clear drainage, nose with swollen turbinates, TMs normal bilaterally.  Neck:     Thyroid: No thyromegaly.  Cardiovascular:     Rate and Rhythm: Normal rate and regular rhythm.  Pulmonary:     Effort: Pulmonary effort is normal. No respiratory distress.     Breath sounds: Normal breath sounds. No wheezing or rales.  Abdominal:     Palpations: Abdomen is soft.  Musculoskeletal:        General: No tenderness.     Cervical back: Normal range of motion.  Lymphadenopathy:     Cervical: No cervical adenopathy.  Skin:    General: Skin is warm and dry.  Neurological:     Mental Status: She is alert and oriented to person, place, and time.    Vitals:   05/29/21 1430  BP: 118/60  Pulse: (!) 57  Resp: 18  Temp:  98.2 F (36.8 C)  TempSrc: Oral  SpO2: 98%  Weight: 134 lb 12.8 oz (61.1 kg)  Height: 5' 4"  (1.626 m)    This visit occurred during the SARS-CoV-2 public health emergency.  Safety protocols were in place, including screening questions prior to the visit, additional usage of staff PPE, and extensive cleaning of exam room while observing appropriate contact time as indicated for disinfecting solutions.   Assessment & Plan:

## 2021-05-29 NOTE — Patient Instructions (Addendum)
We have sent in a z-pack for 10 days. Take 2 pills today, then starting tomorrow take 1 pill daily until gone.  We will check the chest x-ray.

## 2021-05-31 ENCOUNTER — Telehealth: Payer: Medicare Other

## 2021-06-01 DIAGNOSIS — R053 Chronic cough: Secondary | ICD-10-CM | POA: Insufficient documentation

## 2021-06-01 NOTE — Assessment & Plan Note (Signed)
Given persistence of symptoms needs CXR today which is ordered. We will treat with a round of azithromycin for possible lingering sinus infection. This will also cover for CAP if present.

## 2021-06-15 DIAGNOSIS — Z8701 Personal history of pneumonia (recurrent): Secondary | ICD-10-CM | POA: Diagnosis not present

## 2021-06-15 DIAGNOSIS — J3489 Other specified disorders of nose and nasal sinuses: Secondary | ICD-10-CM | POA: Diagnosis not present

## 2021-06-15 DIAGNOSIS — J329 Chronic sinusitis, unspecified: Secondary | ICD-10-CM | POA: Diagnosis not present

## 2021-06-15 DIAGNOSIS — Z8616 Personal history of COVID-19: Secondary | ICD-10-CM | POA: Insufficient documentation

## 2021-06-24 ENCOUNTER — Other Ambulatory Visit: Payer: Self-pay | Admitting: Cardiology

## 2021-06-24 ENCOUNTER — Other Ambulatory Visit: Payer: Self-pay | Admitting: Internal Medicine

## 2021-06-26 ENCOUNTER — Other Ambulatory Visit: Payer: Self-pay | Admitting: Internal Medicine

## 2021-06-26 DIAGNOSIS — J4520 Mild intermittent asthma, uncomplicated: Secondary | ICD-10-CM

## 2021-07-03 ENCOUNTER — Encounter: Payer: Self-pay | Admitting: Internal Medicine

## 2021-07-03 ENCOUNTER — Telehealth: Payer: Self-pay

## 2021-07-03 NOTE — Chronic Care Management (AMB) (Signed)
Chronic Care Management Pharmacy Assistant   Name: Marissa [name]  MRN: [id number] DOB: [birth year]   Reason for Encounter: Disease State Hypertension     Recent office visits:  05/29/21 [name] MD - Seen for chronic cough - Start z-pack for 10 days. Take 2 pills today, then starting tomorrow take 1 pill daily until gone - X rays ordered - No follow up noted   Recent consult visits:  06/15/21 Beckie Salts - Otolaryngology - Sinus pain - X rays ordered - No medication changes noted - No other notes available   Hospital visits:  None in previous 6 months  Medications: Outpatient Encounter Medications as of 07/03/2021  Medication Sig   ALPRAZolam (XANAX) 0.25 MG tablet TAKE 1 TABLET(0.25 MG) BY MOUTH DAILY AS NEEDED FOR ANXIETY   amLODipine (NORVASC) 10 MG tablet TAKE 1 TABLET(10 MG) BY MOUTH DAILY   azithromycin (ZITHROMAX) 250 MG tablet Take 2 tablets on day 1, then 1 tablet daily on days 2 through 10   Bempedoic Acid (NEXLETOL) 180 MG TABS Take 180 mg by mouth daily.   benzonatate (TESSALON PERLES) 100 MG capsule Take 1 capsule (100 mg total) by mouth 3 (three) times daily as needed. (Patient not taking: Reported on 05/29/2021)   carisoprodol (SOMA) 350 MG tablet TAKE 1 TABLET(350 MG) BY MOUTH THREE TIMES DAILY AS NEEDED FOR MUSCLE SPASMS   cloNIDine (CATAPRES) 0.2 MG tablet Take 0.5 tablets (0.1 mg total) by mouth 2 (two) times daily. May take an additional  0.1 mg (1/2 tablet of 0.2 mg) if blood pressure is great than 726 systolic  for more than an hour (Patient taking differently: Take 0.1 mg by mouth 2 (two) times daily as needed. if blood pressure is great than 203 systolic for more than an hour)   Eszopiclone 3 MG TABS TAKE 1 TABLET BY MOUTH AT BEDTIME   ezetimibe (ZETIA) 10 MG tablet TAKE 1 TABLET(10 MG) BY MOUTH DAILY   famotidine (PEPCID) 20 MG tablet Take 20 mg by mouth as needed.   feeding supplement, GLUCERNA SHAKE, (GLUCERNA SHAKE) LIQD Take 237 mLs by mouth  daily.   FLUoxetine (PROZAC) 40 MG capsule TAKE 1 CAPSULE(40 MG) BY MOUTH TWICE DAILY   levalbuterol (XOPENEX HFA) 45 MCG/ACT inhaler Inhale 1-2 puffs into the lungs every 8 (eight) hours as needed for wheezing.   levothyroxine (SYNTHROID) 125 MCG tablet TAKE 1 TABLET(125 MCG) BY MOUTH DAILY   MINIVELLE 0.05 MG/24HR patch Place 1 patch onto the skin 2 (two) times a week.    montelukast (SINGULAIR) 10 MG tablet TAKE 1 TABLET(10 MG) BY MOUTH AT BEDTIME   MYRBETRIQ 50 MG TB24 tablet TAKE 1 TABLET(50 MG) BY MOUTH DAILY   nebivolol (BYSTOLIC) 10 MG tablet TAKE 1 TABLET(10 MG) BY MOUTH DAILY AT 8 PM   Respiratory Therapy Supplies (FLUTTER) DEVI Use as directed   rivastigmine (EXELON) 1.5 MG capsule TAKE 1 CAPSULE(1.5 MG) BY MOUTH TWICE DAILY   No facility-administered encounter medications on file as of 07/03/2021.   Reviewed chart prior to disease state call. Spoke with patient regarding BP  Recent Office Vitals: BP Readings from Last 3 Encounters:  05/29/21 118/60  05/17/21 138/70  01/19/21 135/77   Pulse Readings from Last 3 Encounters:  05/29/21 (!) 57  01/19/21 86  12/21/20 60    Wt Readings from Last 3 Encounters:  05/29/21 134 lb 12.8 oz (61.1 kg)  05/17/21 130 lb (59 kg)  01/19/21 142 lb 9.6 oz (  64.7 kg)     Kidney Function Lab Results  Component Value Date/Time   CREATININE 0.79 04/09/2021 10:19 AM   CREATININE 1.23 (H) 11/21/2020 02:43 PM   CREATININE 0.47 (L) 03/07/2016 02:15 PM   CREATININE 0.60 02/10/2015 04:02 PM   GFR 42.86 (L) 11/21/2020 02:43 PM   GFRNONAA 81 01/12/2020 11:15 AM   GFRAA 93 01/12/2020 11:15 AM    BMP Latest Ref Rng & Units 04/09/2021 11/21/2020 01/12/2020  Glucose 65 - 99 mg/dL 88 101(H) 88  BUN 8 - 27 mg/dL 10 17 7(L)  Creatinine 0.57 - 1.00 mg/dL 0.79 1.23(H) 0.73  BUN/Creat Ratio 12 - 28 13 - 10(L)  Sodium 134 - 144 mmol/L 132(L) 133(L) 135  Potassium 3.5 - 5.2 mmol/L 4.1 4.0 4.2  Chloride 96 - 106 mmol/L 97 98 95(L)  CO2 20 - 29 mmol/L 21  26 24   Calcium 8.7 - 10.3 mg/dL 9.0 8.6 9.6    Current antihypertensive regimen:  Amlodipine 23m daily  Clonidine 0.141mtwice daily as needed - for systolic blood pressure >1>017mHg Nebivolol 104maily  How often are you checking your Blood Pressure? infrequently Current home BP readings: 150/80 on 07/03/21 What recent interventions/DTPs have been made by any provider to improve Blood Pressure control since last CPP Visit: N/a Any recent hospitalizations or ED visits since last visit with CPP? No What diet changes have been made to improve Blood Pressure Control?  N/a  What exercise is being done to improve your Blood Pressure Control?  Patient states that she does not do any physical activity.    Adherence Review: Is the patient currently on ACE/ARB medication? No Does the patient have >5 day gap between last estimated fill dates? No  Misc. Comment: Per patient states that she is not doing well mentally today. She states that she has been having "wicked dreams" with the medication Rivastigmine 1.5 mg. Patient states that she sleeps well and deeply on Rivastigmine but her dreams cause her to feel locked up and feel as if she can't get out. These dreams happen about 4 times a week. Patient also states that she feels dizzy after taking Rivastigmine for about 1 hr and 30 min and has to rest for a while, until it settles. She would like to ask the pharmacist if there is another medication that would be better suited for her but she states that she can deal with the symptoms if there are no other options. Patient mentions that she is out of her eszopiclone medication and states that she already informed her PCP through my chart to get authorization for her. I informed her that if she has any issues with getting her medications in the future, she can give me a call, and I can then inform the pharmacist.   Patient has a future appointment on 08/02/21 @3 :30  Star Rating Drugs: None   DebAndee PolesMAGautier

## 2021-07-03 NOTE — Chronic Care Management (AMB) (Signed)
Called and discussed with patient about concerns with rivastigmine that she has been taking. Reports that she has been having issues with her sleep/ dreams since starting, also notes to worsening depression/ mood since starting medication. Denies any thoughts of suicide or self harm at this time  Patient has follow up appointment with Dr. Charlotte Crumb (Neurology) 10/30/2021 - Advised for patient to reach out to discuss possible trial of a new medication, perhaps patient would find benefit in memantine.  Patient will call to discuss possible change in medications.  Patient contemplating following with psychiatrist that her daughter has followed with and had success with, encouraged patient to make appointment with psychiatry as well, patient unsure if she will go forward with that, but is agreeable to discuss with neurologist.  Patient aware to reach out should she have any questions or concerns   Follow up in 4 weeks   Marissa [name], PharmD Clinical Pharmacist, [place]

## 2021-07-11 ENCOUNTER — Encounter: Payer: Self-pay | Admitting: Counselor

## 2021-07-20 ENCOUNTER — Telehealth: Payer: Self-pay | Admitting: Internal Medicine

## 2021-07-20 NOTE — Telephone Encounter (Signed)
   Patient calling to report chest pain and SOB  Call transferred to [facility]

## 2021-07-23 ENCOUNTER — Telehealth: Payer: Self-pay

## 2021-07-23 NOTE — Telephone Encounter (Signed)
Spoke with pt she stated she has felt bad for a while being on the Rivastigmine but it as gotten worse she is very tired she sleeps 12-14  hours and then is still tried during the day, she stated she has episodes of SOB but  she has had recent pneumonia and had hx of asthma. She is asking if we can try something else or decrease what she is taken? She stated that up to 2 hours after taken her medication she cant think she cant wright she is very confuses. She has had a decrease in concentration, she goes blank, cant find her words, dizzy stuttering, walks into stuff. She is very depressed doesn't want to leave the house. Asking if her depression medication needs to be changed as well? She stated she has to put on a fake smile and try to find the right things when talking to her children. She was also wondering if she was just areally tried the reason she did bad on her neuro psych testing? Asking if she needs to be seen sooner? Pt advised that I will call her back with any changes.

## 2021-07-23 NOTE — Telephone Encounter (Signed)
Pt called back to let heather know she has been having nightmares for a while now. When she wakes up she will be soaked from sweat, and feels like she is tied up in a knot.

## 2021-07-23 NOTE — Telephone Encounter (Signed)
Pt called no answer left a message with pt husband to call the office back

## 2021-07-23 NOTE — Telephone Encounter (Signed)
Pt called left a message with her husband to call the office back

## 2021-07-23 NOTE — Telephone Encounter (Signed)
[facility] FYI...   Caller states she is experiencing tightness in her chest and having trouble breathing. She has also felt weak for the past week. Caller states O2 is 97. She states she will call [facility] which is a service that will send someone out to check vital signs.   Advised to go to ED now. Patient understood but disagreed. Preferred to call the doctor.

## 2021-07-24 NOTE — Telephone Encounter (Signed)
Pls have her stop the Rivastigmine. Ok to put on Marissa [name]'s schedule for earlier appt to discuss issues, thanks

## 2021-07-24 NOTE — Telephone Encounter (Signed)
Pt called an advised to  stop the Rivastigmine, she was added to Sara's schedule for 9/29 at 1pm

## 2021-07-24 NOTE — Telephone Encounter (Signed)
Pt called no answer left a voice mail to call the office back  °

## 2021-07-26 ENCOUNTER — Ambulatory Visit: Payer: Medicare Other | Admitting: Physician Assistant

## 2021-07-26 ENCOUNTER — Encounter: Payer: Self-pay | Admitting: Physician Assistant

## 2021-07-26 ENCOUNTER — Other Ambulatory Visit: Payer: Self-pay

## 2021-07-26 VITALS — BP 148/73 | HR 75 | Resp 20 | Ht 64.0 in | Wt 134.0 lb

## 2021-07-26 DIAGNOSIS — F09 Unspecified mental disorder due to known physiological condition: Secondary | ICD-10-CM | POA: Diagnosis not present

## 2021-07-26 DIAGNOSIS — R29898 Other symptoms and signs involving the musculoskeletal system: Secondary | ICD-10-CM

## 2021-07-26 DIAGNOSIS — F3289 Other specified depressive episodes: Secondary | ICD-10-CM | POA: Diagnosis not present

## 2021-07-26 DIAGNOSIS — G3184 Mild cognitive impairment, so stated: Secondary | ICD-10-CM

## 2021-07-26 MED ORDER — MEMANTINE HCL 5 MG PO TABS: ORAL_TABLET | ORAL | 11 refills | Status: DC
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
## 2021-07-26 NOTE — Patient Instructions (Addendum)
1. Start Start Memantine 5 mg tablets.  Take 1 tablet at bedtime for 2 weeks, then 1 tablet twice daily.   Side effects include dizziness, headache, diarrhea or constipation.  Call with any questions or concerns.   2. Referral will be sent fo physical therapy and speech therapy  3. Continue repeat Neurocognitive testing in March 2023  4. Follow-up after the neurocognitive testing   5. Referral to Behavioral Therapy    FALL PRECAUTIONS: Be cautious when walking. Scan the area for obstacles that may increase the risk of trips and falls. When getting up in the mornings, sit up at the edge of the bed for a few minutes before getting out of bed. Consider elevating the bed at the head end to avoid drop of blood pressure when getting up. Walk always in a well-lit room (use night lights in the walls). Avoid area rugs or power cords from appliances in the middle of the walkways. Use a walker or a cane if necessary and consider physical therapy for balance exercise. Get your eyesight checked regularly.  FINANCIAL OVERSIGHT: Supervision, especially oversight when making financial decisions or transactions is also recommended as difficulties arise.  HOME SAFETY: Consider the safety of the kitchen when operating appliances like stoves, microwave oven, and blender. Consider having supervision and share cooking responsibilities until no longer able to participate in those. Accidents with firearms and other hazards in the house should be identified and addressed as well.  DRIVING: Regarding driving, in patients with progressive memory problems, driving will be impaired. We advise to have someone else do the driving if trouble finding directions or if minor accidents are reported. Independent driving assessment is available to determine safety of driving.  ABILITY TO BE LEFT ALONE: If patient is unable to contact 911 operator, consider using LifeLine, or when the need is there, arrange for someone to stay with  patients. Smoking is a fire hazard, consider supervision or cessation. Risk of wandering should be assessed by caregiver and if detected at any point, supervision and safe proof recommendations should be instituted.  MEDICATION SUPERVISION: Inability to self-administer medication needs to be constantly addressed. Implement a mechanism to ensure safe administration of the medications.  RECOMMENDATIONS FOR ALL PATIENTS WITH MEMORY PROBLEMS: 1. Continue to exercise (Recommend 30 minutes of walking everyday, or 3 hours every week) 2. Increase social interactions - continue going to [place] and enjoy social gatherings with friends and family 3. Eat healthy, avoid fried foods and eat more fruits and vegetables 4. Maintain adequate blood pressure, blood sugar, and blood cholesterol level. Reducing the risk of stroke and cardiovascular disease also helps promoting better memory. 5. Avoid stressful situations. Live a simple life and avoid aggravations. Organize your time and prepare for the next day in anticipation. 6. Sleep well, avoid any interruptions of sleep and avoid any distractions in the bedroom that may interfere with adequate sleep quality 7. Avoid sugar, avoid sweets as there is a strong link between excessive sugar intake, diabetes, and cognitive impairment We discussed the Mediterranean diet, which has been shown to help patients reduce the risk of progressive memory disorders and reduces cardiovascular risk. This includes eating fish, eat fruits and green leafy vegetables, nuts like almonds and hazelnuts, walnuts, and also use olive oil. Avoid fast foods and fried foods as much as possible. Avoid sweets and sugar as sugar use has been linked to worsening of memory function.  There is always a concern of gradual progression of memory problems. If this is  the case, then we may need to adjust level of care according to patient needs. Support, both to the patient and caregiver, should then be put into  place.

## 2021-07-31 ENCOUNTER — Other Ambulatory Visit: Payer: Self-pay

## 2021-07-31 DIAGNOSIS — G3184 Mild cognitive impairment, so stated: Secondary | ICD-10-CM

## 2021-08-02 ENCOUNTER — Other Ambulatory Visit: Payer: Self-pay | Admitting: Thoracic Surgery (Cardiothoracic Vascular Surgery)

## 2021-08-02 ENCOUNTER — Telehealth: Payer: Self-pay

## 2021-08-02 ENCOUNTER — Other Ambulatory Visit: Payer: Self-pay

## 2021-08-02 ENCOUNTER — Ambulatory Visit (INDEPENDENT_AMBULATORY_CARE_PROVIDER_SITE_OTHER): Payer: Medicare Other

## 2021-08-02 DIAGNOSIS — R0989 Other specified symptoms and signs involving the circulatory and respiratory systems: Secondary | ICD-10-CM

## 2021-08-02 DIAGNOSIS — M797 Fibromyalgia: Secondary | ICD-10-CM

## 2021-08-02 DIAGNOSIS — F3341 Major depressive disorder, recurrent, in partial remission: Secondary | ICD-10-CM

## 2021-08-02 DIAGNOSIS — R053 Chronic cough: Secondary | ICD-10-CM

## 2021-08-02 DIAGNOSIS — I7121 Aneurysm of the ascending aorta, without rupture: Secondary | ICD-10-CM

## 2021-08-02 NOTE — Telephone Encounter (Signed)
PA for Eszopiclone started.  KEY: [id number]

## 2021-08-02 NOTE — Patient Instructions (Signed)
Oval Linsey Grissom,  It was great to talk to you today!  Please call me with any questions or concerns.  Visit Information  PATIENT GOALS:  Goals Addressed             This Visit's Progress    Manage My Medicine   On track    Timeframe:  Long-Range Goal Priority:  High Start Date:   03/29/2021                         Expected End Date: 09/28/2021                      Follow Up Date 04/26/2021   - call for medicine refill 2 or 3 days before it runs out - keep a list of all the medicines I take; vitamins and herbals too    Why is this important?   These steps will help you keep on track with your medicines.   Notes: Patient given contact information for upstream pharmacy to set up blister packaging to help with adherence         Patient verbalizes understanding of instructions provided today and agrees to view in [place].   Telephone follow up appointment with care management team member scheduled for: 1 month The patient has been provided with contact information for the care management team and has been advised to call with any health related questions or concerns.   Tomasa Blase, PharmD Clinical Pharmacist, [place]

## 2021-08-02 NOTE — Progress Notes (Signed)
Chronic Care Management Pharmacy Note  08/02/2021 Name:  Marissa [name] MRN:  [id number] DOB:  1946/10/05  Summary: - Patient started on memantine 56m daily last week - is to titrate to BID dosing next week -Reports that she feels she is very fatigued recently, pain has been increased - feels that this is the reason she is so fatigued -Scheduled to start PT/ speech therapy in the next few weeks -Reports that sleep has improved since starting on memantine, is not longer having nightmares  -Patient notes that cough/ congestion from August has improved, but still lingering and at times can have a bad cough still - wondering when she is due for follow up x-ray   Recommendations/Changes made from today's visit: -Recommending no changes to medications at this time, has just recently started memantine, is tolerating well and without issue, to increase to BID dosing next week -Patient has follow up with cardiology and neurology within the next month- has no current issues with medications, will make clinic aware of any changes  -would recommend for follow up chest x-ray as patient is still having cough that she was in to see PCP for in August  Subjective: Marissa Piazzais an 75y.o. year old female who is a primary patient of CHoyt Koch MD.  The CCM team was consulted for assistance with disease management and care coordination needs.    Engaged with patient by telephone for follow up visit in response to provider referral for pharmacy case management and/or care coordination services.   Consent to Services:  The patient was given the following information about Chronic Care Management services today, agreed to services, and gave verbal consent: 1. CCM service includes personalized support from designated clinical staff supervised by the primary care provider, including individualized plan of care and coordination with other care providers 2. 24/7 contact phone numbers for assistance  for urgent and routine care needs. 3. Service will only be billed when office clinical staff spend 20 minutes or more in a month to coordinate care. 4. Only one practitioner may furnish and bill the service in a calendar month. 5.The patient may stop CCM services at any time (effective at the end of the month) by phone call to the office staff. 6. The patient will be responsible for cost sharing (co-pay) of up to 20% of the service fee (after annual deductible is met). Patient agreed to services and consent obtained.  Patient Care Team: CHoyt Koch MD as PCP - General (Internal Medicine) HLeonie Man MD as PCP - Cardiology (Cardiology) HMelrose Nakayama MD as Consulting Physician (Cardiothoracic Surgery) HLeonie Man MD as Consulting Physician (Cardiology) WTanda Rockers MD as Consulting Physician (Pulmonary Disease) ACameron Sprang MD as Consulting Physician (Neurology) SDelice BisonDDarnelle Maffucci [facility] [place] Pharmacist (Pharmacist)  Recent office visits: 05/29/2021 - EPricilla HolmMD (PCP) - f/u for sinus issues  - CXR ordered- z-pack prescribed    Recent consult visits: 07/26/2021 - SSharene ButtersPA-C - (Neurology) - start memantine 518mdaily - titrate to BID dosing - rivastigmine caused nightmares/ sweats / feeling out of it - referred to behavioral therapy   Hospital visits: None in previous 6 months  Objective:  Lab Results  Component Value Date   CREATININE 0.79 04/09/2021   BUN 10 04/09/2021   GFR 42.86 (L) 11/21/2020   GFRNONAA 81 01/12/2020   GFRAA 93 01/12/2020   NA 132 (L) 04/09/2021   K 4.1 04/09/2021  CALCIUM 9.0 04/09/2021   CO2 21 04/09/2021   GLUCOSE 88 04/09/2021    Lab Results  Component Value Date/Time   GFR 42.86 (L) 11/21/2020 02:43 PM   GFR 79.17 01/06/2019 10:30 AM    Last diabetic Eye exam:  No results found for: HMDIABEYEEXA  Last diabetic Foot exam:  No results found for: HMDIABFOOTEX   Lab Results  Component Value Date    CHOL 218 (H) 04/09/2021   HDL 79 04/09/2021   LDLCALC 123 (H) 04/09/2021   LDLDIRECT 146.0 11/21/2020   TRIG 90 04/09/2021   CHOLHDL 2.8 04/09/2021    Hepatic Function Latest Ref Rng & Units 04/09/2021 11/21/2020 01/12/2020  Total Protein 6.0 - 8.5 g/dL 6.2 6.9 7.0  Albumin 3.7 - 4.7 g/dL 4.2 4.6 4.6  AST 0 - 40 IU/L 20 87(H) 33  ALT 0 - 32 IU/L 17 94(H) 28  Alk Phosphatase 44 - 121 IU/L 71 70 71  Total Bilirubin 0.0 - 1.2 mg/dL 0.3 0.5 0.5  Bilirubin, Direct 0.0 - 0.3 mg/dL - - -    Lab Results  Component Value Date/Time   TSH 1.51 11/01/2020 11:52 AM   TSH 0.265 (L) 01/12/2020 11:15 AM   FREET4 1.13 08/04/2018 02:08 PM   FREET4 1.22 08/07/2017 02:06 PM    CBC Latest Ref Rng & Units 11/21/2020 01/12/2020 12/31/2018  WBC 4.0 - 10.5 K/uL 6.3 4.3 5.0  Hemoglobin 12.0 - 15.0 g/dL 11.9(L) 12.6 10.7(L)  Hematocrit 36.0 - 46.0 % 34.8(L) 35.7 30.0(L)  Platelets 150.0 - 400.0 K/uL 178.0 267 341.0    Lab Results  Component Value Date/Time   VD25OH 42.11 11/27/2017 11:59 AM   VD25OH 16.58 (L) 08/07/2017 02:06 PM    Clinical ASCVD: No  The 10-year ASCVD risk score (Arnett DK, et al., 2019) is: 30.5%   Values used to calculate the score:     Age: 8 years     Sex: Female     Is Non-Hispanic African American: No     Diabetic: No     Tobacco smoker: No     Systolic Blood Pressure: 283 mmHg     Is BP treated: Yes     HDL Cholesterol: 79 mg/dL     Total Cholesterol: 218 mg/dL    Depression screen [facility] 2/9 07/27/2020 02/21/2020 07/26/2019  Decreased Interest 0 3 0  Down, Depressed, Hopeless 0 1 1  PHQ - 2 Score 0 4 1  Altered sleeping - 1 1  Tired, decreased energy - 1 2  Change in appetite - 0 1  Feeling bad or failure about yourself  - 1 0  Trouble concentrating - 3 0  Moving slowly or fidgety/restless - 2 0  Suicidal thoughts - 0 0  PHQ-9 Score - 12 5  Difficult doing work/chores - Somewhat difficult Not difficult at all  Some recent data might be hidden     Social History    Tobacco Use  Smoking Status Never  Smokeless Tobacco Never   BP Readings from Last 3 Encounters:  07/26/21 (!) 148/73  05/29/21 118/60  05/17/21 138/70   Pulse Readings from Last 3 Encounters:  07/26/21 75  05/29/21 (!) 57  01/19/21 86   Wt Readings from Last 3 Encounters:  07/26/21 134 lb (60.8 kg)  05/29/21 134 lb 12.8 oz (61.1 kg)  05/17/21 130 lb (59 kg)   BMI Readings from Last 3 Encounters:  07/26/21 23.00 kg/m  05/29/21 23.14 kg/m  05/17/21 22.31 kg/m    Assessment/Interventions:  Review of patient past medical history, allergies, medications, health status, including review of consultants reports, laboratory and other test data, was performed as part of comprehensive evaluation and provision of chronic care management services.   SDOH:  (Social Determinants of Health) assessments and interventions performed: Yes  SDOH Screenings   Alcohol Screen: Not on file  Depression (PHQ2-9): Not on file  Financial Resource Strain: Not on file  Food Insecurity: Not on file  Housing: Not on file  Physical Activity: Not on file  Social Connections: Not on file  Stress: Not on file  Tobacco Use: Low Risk    Smoking Tobacco Use: Never   Smokeless Tobacco Use: Never  Transportation Needs: Not on file    CCM Care Plan  Allergies  Allergen Reactions   Fentanyl Shortness Of Breath and Rash   Rosuvastatin Anaphylaxis, Anxiety, Hives, Itching, Nausea And Vomiting, Other (See Comments), Palpitations, Photosensitivity and Shortness Of Breath   Amoxicillin Diarrhea    Severe diarrhea, rash to vaginal area with swelling    Hctz [Hydrochlorothiazide] Other (See Comments)    HypoNatremia   Hydralazine     Weak, sweats, red skin   Codeine Other (See Comments)    makes her hyper   Conjugated Estrogens Itching and Rash   Erythromycin Rash    had a rash with emycin, has done ok with other meds in it's class   Piroxicam Itching and Rash    Feldene    Medications  Reviewed Today     Reviewed by Tomasa Blase, [place] (Pharmacist) on 08/02/21 at [place] Status: <None>   Medication Order Taking? Sig Documenting Provider Last Dose Status Informant  ALPRAZolam (XANAX) 0.25 MG tablet [id number] No TAKE 1 TABLET(0.25 MG) BY MOUTH DAILY AS NEEDED FOR ANXIETY [name], MD Taking Active   amLODipine (NORVASC) 10 MG tablet [id number] No TAKE 1 TABLET(10 MG) BY MOUTH DAILY [name], MD Taking Active     Discontinued 08/02/21 1609 (Completed Course)   Bempedoic Acid (NEXLETOL) 180 MG TABS [id number] No Take 180 mg by mouth daily. [name], MD Taking Active   benzonatate (TESSALON PERLES) 100 MG capsule [id number] No Take 1 capsule (100 mg total) by mouth 3 (three) times daily as needed. Lucretia Kern, DO Taking Active   carisoprodol (SOMA) 350 MG tablet [id number] No TAKE 1 TABLET(350 MG) BY MOUTH THREE TIMES DAILY AS NEEDED FOR MUSCLE SPASMS [name], MD Taking Active   cloNIDine (CATAPRES) 0.2 MG tablet [id number] No Take 0.5 tablets (0.1 mg total) by mouth 2 (two) times daily. May take an additional  0.1 mg (1/2 tablet of 0.2 mg) if blood pressure is great than 263 systolic  for more than an hour  Patient taking differently: Take 0.1 mg by mouth 2 (two) times daily as needed. if blood pressure is great than 335 systolic for more than an hour   [name], MD Taking Active   Eszopiclone 3 MG TABS [id number] No TAKE 1 TABLET BY MOUTH AT BEDTIME [name], MD Taking Active   ezetimibe (ZETIA) 10 MG tablet [id number] No TAKE 1 TABLET(10 MG) BY MOUTH DAILY [name], MD Taking Active   famotidine (PEPCID) 20 MG tablet [id number] No Take 20 mg by mouth as needed. [provider] Taking Active   feeding supplement, GLUCERNA SHAKE, ([place]) LIQD [id number] No Take 237 mLs by mouth daily. [provider] Taking Active   FLUoxetine (PROZAC) 40 MG capsule  [id number] No TAKE 1 CAPSULE(40 MG)  BY MOUTH TWICE DAILY [name], MD Taking Active   levalbuterol [facility] HFA) 45 MCG/ACT inhaler [id number] No Inhale 1-2 puffs into the lungs every 8 (eight) hours as needed for wheezing. Janith Lima, MD Taking Active   levothyroxine (SYNTHROID) 125 MCG tablet [id number] No TAKE 1 TABLET(125 MCG) BY MOUTH DAILY [name], MD Taking Active   memantine [facility]) 5 MG tablet [id number]  Take 1 tablet (3m at night) for 2 weeks, then increase to 1 tablet (5 mg) twice a day WElease Hashimoto Active   MINIVELLE 0.05 MG/24HR patch [id number] Place 1 patch onto the skin 2 (two) times a week.  [provider] Taking Active Self           Med Note (Gaynell FaceFeb 24, 2020  9:21 PM)    montelukast (SINGULAIR) 10 MG tablet [id number] TAKE 1 TABLET(10 MG) BY MOUTH AT BEDTIME CHoyt Koch MD Taking Active   MYRBETRIQ 50 MG TB24 tablet [id number] TAKE 1 TABLET(50 MG) BY MOUTH DAILY CHoyt Koch MD Taking Active   nebivolol (BYSTOLIC) 10 MG tablet [id number] TAKE 1 TABLET(10 MG) BY MOUTH DAILY AT 8 PM HLeonie Man MD Taking Active   Respiratory Therapy Supplies (FLUTTER) DEVI [id number] Use as directed WTanda Rockers MD Taking Active             Patient Active Problem List   Diagnosis Date Noted   Chronic cough 06/01/2021   Memory loss 07/15/2020   Malnutrition ([place] 12/22/2018   Hypokalemia 12/22/2018   Hypomagnesemia 12/22/2018   Hypophosphatemia 12/22/2018   Anxiety    Prolonged QT interval 12/21/2018   Hyponatremia 12/21/2018   Hyperlipidemia with target LDL less than 100 10/02/2018   Encounter for chronic pain management 11/28/2017   Dyspnea on exertion 06/01/2017   Mild aortic stenosis 05/27/2017   Routine general medical examination at a health care facility 07/09/2016   Recurrent falls 03/28/2016   Rosacea 08/18/2015   Lumbar radiculopathy 07/07/2015   Urinary incontinence 06/20/2015    Hemorrhoids 10/06/2014   Cough variant asthma vs UACS 08/03/2014   Labile hypertension 02/22/2014   Insomnia secondary to chronic pain 07/09/2012   B12 deficiency 04/03/2011   Unspecified vitamin D deficiency 07/25/2010   ANEMIA, IRON DEFICIENCY 04/25/2009   Allergic rhinitis 02/01/2009   Mild dilation of ascending aorta (HCC) 12/27/2008   Chronic fatigue syndrome 04/21/2008   Major depression in partial remission ([place] 01/19/2008   Osteoarthritis 01/19/2008   Hypothyroidism 03/13/2007   Asthma 03/13/2007   Fibromyalgia 03/13/2007    Immunization History  Administered Date(s) Administered   Fluad Quad(high Dose 65+) 07/14/2020   Hep A / Hep B 02/24/2018, 06/25/2018   Hepatitis B, adult 03/26/2018   Influenza Split 09/12/2011, 08/19/2012   Influenza Whole 08/20/2007, 07/29/2008, 08/07/2009, 07/25/2010   Influenza, High Dose Seasonal PF 07/09/2016, 07/14/2017, 07/24/2018   Influenza,inj,Quad PF,6+ Mos 07/19/2013, 08/16/2014, 07/13/2015   Influenza-Unspecified 07/15/2019   PFIZER(Purple Top)SARS-COV-2 Vaccination 11/17/2019, 12/08/2019   Pneumococcal Conjugate-13 01/06/2015   Pneumococcal Polysaccharide-23 07/19/2013   Tdap 07/29/2011   Zoster, Live 10/28/2012    Conditions to be addressed/monitored:  Hypertension, Hyperlipidemia, GERD, Asthma, Hypothyroidism, Depression and Anxiety  Care Plan : CCM Care Plan  Updates made by STomasa Blase RFancy Farmsince 08/02/2021 12:00 AM     Problem: HTN, HLD, Hypothyroidism, Chronic Pain, OAB, GERD, Asthma, Depression, Anxiety, Insomnia, Memory Loss  Priority: High  Onset Date: 03/29/2021     Long-Range Goal: Disease Management   Start Date: 03/29/2021  Expected End Date: 09/28/2021  This Visit's Progress: On track  Recent Progress: Not on track  Priority: High  Note:   Current Barriers:  Unable to independently monitor therapeutic efficacy Does not adhere to prescribed medication regimen  Pharmacist Clinical Goal(s):  Patient  will verbalize ability to afford treatment regimen achieve adherence to monitoring guidelines and medication adherence to achieve therapeutic efficacy achieve ability to self administer medications as prescribed through use of bubble packaging / blister packages from pharmacy  as evidenced by patient report through collaboration with PharmD and provider.   Interventions: 1:1 collaboration with [name], MD regarding development and update of comprehensive plan of care as evidenced by provider attestation and co-signature Inter-disciplinary care team collaboration (see longitudinal plan of care) Comprehensive medication review performed; medication list updated in electronic medical record  Hypertension (BP goal <140/90) -Not ideally controlled -Current treatment: Amlodipine 68m daily  Clonidine 0.198mtwice daily as needed - for systolic blood pressure >1>701mHg Nebivolol 1036maily  -Medications previously tried: Spironolactone, valsartan, verapamil , hydralazine, irbesartan, lisinopril  -Current home readings: not assessed with appointment today -Current dietary habits: reports to diet that is low in sodium and caffeine  -Current exercise habits: none at this time  -Denies hypotensive/hypertensive symptoms -Educated on BP goals and benefits of medications for prevention of heart attack, stroke and kidney damage; Daily salt intake goal < 2300 mg; Importance of home blood pressure monitoring; -Counseled to monitor BP at home daily to every other day, document, and provide log at future appointments -Counseled on diet and exercise extensively Recommended to continue current medication  Hyperlipidemia: (LDL goal < 100) -Not ideally controlled   - Last LDL 146 mg/dL - 11/21/2020 -Current treatment: Nexletol 180m31mily  Ezetimibe 10mg51mly -Medications previously tried: rosuvastatin   -Current dietary patterns: reports to eating fish at least 1-2 times weekly, notes to  eating chicken on occasion, red meat throughout the week, avoids fried foods  -Current exercise habits: none at this time  -Educated on Cholesterol goals;  Importance of limiting foods high in cholesterol; Exercise goal of 150 minutes per week; -Recommended no changes in medications at this time   Asthma(Goal: control symptoms and prevent exacerbations) -Controlled -Current treatment  Xopenex 45mcg50m - 1-2 puffs every 8 hours as needed  Monelukast 10mg d18m  -Medications previously tried: symbicort, proair  -Exacerbations requiring treatment in last 6 months: n/a -Frequency of rescue inhaler use: 2-3 time weekly  -Counseled on Proper inhaler technique; When to use rescue inhaler -Recommended to continue current medication  Depression/Anxiety/ Insomnia (Goal: Mood control / prevention of anxiety attacks/ improved sleep quality ) -Controlled -Current treatment: Fluoxetine 60mg da30m Alprazolam 0.25mg - 139mlet daily as needed -  Taking no more than 2 times a week  eszopiclone 3mg at be61mme  -Medications previously tried/failed: doxepin, aripiprazole  -Patient is following with Psychology at this time  -Educated on Benefits of medication for symptom control Benefits of cognitive-behavioral therapy with or without medication -Recommended to continue current medication  -Most recent eGFR 77 mL/min / CrCl: 61.58 mL/min   Hypothyroidism(Goal: Maintenance of TSH) -Controlled  - Last TSH level: 1.51 uIU/mL - 11/01/2020 -Current treatment  Levothyroxine 125mcg dail33mMedications previously tried: n/a  -Recommended to continue current medication  GERD (Goal: acid control) -Controlled  -Current treatment  Omeprazole 20mg daily 11mly takes as needed -  not currently taking  Famotidine 29m daily - not currently taking  -Medications previously tried: n/a  -Recommended to continue current medication   Memory Loss (Goal: prevention of disease progression / maintenance of  cognitive function) -Controlled -Current treatment  Memantine 579m- 1 tablet twice daily  -Medications previously tried: donepezil, rivastigmine    -Recommended to continue current medication Counseled on cognitive impairing effects that can be caused by soma and alprazolam - discussed with patient about using alternative medications / using medications in lowest doses needed for effectiveness to help lower cognitive impairing effects of medications    Overactive Bladder (Goal: Control of urination status / urge) -Not ideally controlled -Current treatment  Myrbetriq 5042maily  -Medications previously tried: vesicare, detrol LA, oxybutynin  -Recommended to continue current medication   Chronic Pain / Fibromyalgia  (Neck / Shoulders / Back) (Goal: Pain control / prevention of flares ) -Not ideally controlled -Current treatment  Carisoprodol 350m32mblet - 1/2 tablet twice daily as needed  -Medications previously tried: baclofen, gabapentin, lyrica   -Recommending no changes at this time as patient has started new medication from neurology   -Possible in future to discuss rotation of fluoxetine to duloxetine which could also replace need for soma   Patient Goals/Self-Care Activities Patient will:  - take medications as prescribed focus on medication adherence by setting up with a pharmacy for blister / med packaging  check blood pressure daily to every other day , document, and provide at future appointments collaborate with provider on medication access solutions  Follow Up Plan: Telephone follow up appointment with care management team member scheduled for: 3 months The patient has been provided with contact information for the care management team and has been advised to call with any health related questions or concerns.          Medication Assistance:  Possible that patient may need PA / Assistance for NexlRos[facility] LLCll recheck once she is home and reach out if needed    [id number]: [phone]: [phone] pill box? No  Patient endorses 90% compliance  Care Plan and Follow Up Patient Decision:  Patient agrees to Care Plan and Follow-up.  Plan: Telephone follow up appointment with care management team member scheduled for:  3 months and The patient has been provided with contact information for the care management team and has been advised to call with any health related questions or concerns.   DaniTomasa BlasearmD Clinical Pharmacist, [place]

## 2021-08-03 ENCOUNTER — Emergency Department (HOSPITAL_COMMUNITY)
Admission: EM | Admit: 2021-08-03 | Discharge: 2021-08-03 | Disposition: A | Discharge status: Home / Self Care | Payer: Medicare Other | Attending: Emergency Medicine | Admitting: Emergency Medicine

## 2021-08-03 ENCOUNTER — Other Ambulatory Visit: Payer: Self-pay

## 2021-08-03 ENCOUNTER — Other Ambulatory Visit: Payer: Self-pay | Admitting: Internal Medicine

## 2021-08-03 ENCOUNTER — Encounter (HOSPITAL_COMMUNITY): Payer: Self-pay | Admitting: *Deleted

## 2021-08-03 ENCOUNTER — Emergency Department (HOSPITAL_COMMUNITY): Payer: Medicare Other

## 2021-08-03 DIAGNOSIS — R0602 Shortness of breath: Secondary | ICD-10-CM | POA: Diagnosis not present

## 2021-08-03 DIAGNOSIS — R079 Chest pain, unspecified: Secondary | ICD-10-CM | POA: Insufficient documentation

## 2021-08-03 DIAGNOSIS — Z5321 Procedure and treatment not carried out due to patient leaving prior to being seen by health care provider: Secondary | ICD-10-CM | POA: Insufficient documentation

## 2021-08-03 DIAGNOSIS — R202 Paresthesia of skin: Secondary | ICD-10-CM | POA: Insufficient documentation

## 2021-08-03 DIAGNOSIS — R42 Dizziness and giddiness: Secondary | ICD-10-CM | POA: Insufficient documentation

## 2021-08-03 DIAGNOSIS — I639 Cerebral infarction, unspecified: Secondary | ICD-10-CM | POA: Diagnosis not present

## 2021-08-03 DIAGNOSIS — R059 Cough, unspecified: Secondary | ICD-10-CM

## 2021-08-03 LAB — CBC WITH DIFFERENTIAL/PLATELET
Abs Immature Granulocytes: 0.02 10*3/uL (ref 0.00–0.07)
Basophils Absolute: 0 10*3/uL (ref 0.0–0.1)
Basophils Relative: 1 %
Eosinophils Absolute: 0 10*3/uL (ref 0.0–0.5)
Eosinophils Relative: 1 %
HCT: 34.9 % — ABNORMAL LOW (ref 36.0–46.0)
Hemoglobin: 11.9 g/dL — ABNORMAL LOW (ref 12.0–15.0)
Immature Granulocytes: 0 %
Lymphocytes Relative: 14 %
Lymphs Abs: 0.8 10*3/uL (ref 0.7–4.0)
MCH: 30.4 pg (ref 26.0–34.0)
MCHC: 34.1 g/dL (ref 30.0–36.0)
MCV: 89.3 fL (ref 80.0–100.0)
Monocytes Absolute: 0.8 10*3/uL (ref 0.1–1.0)
Monocytes Relative: 14 %
Neutro Abs: 3.9 10*3/uL (ref 1.7–7.7)
Neutrophils Relative %: 70 %
Platelets: 192 10*3/uL (ref 150–400)
RBC: 3.91 MIL/uL (ref 3.87–5.11)
RDW: 13.7 % (ref 11.5–15.5)
WBC: 5.5 10*3/uL (ref 4.0–10.5)
nRBC: 0 % (ref 0.0–0.2)

## 2021-08-03 LAB — COMPREHENSIVE METABOLIC PANEL
ALT: 22 U/L (ref 0–44)
AST: 37 U/L (ref 15–41)
Albumin: 4.2 g/dL (ref 3.5–5.0)
Alkaline Phosphatase: 45 U/L (ref 38–126)
Anion gap: 13 (ref 5–15)
BUN: 14 mg/dL (ref 8–23)
CO2: 21 mmol/L — ABNORMAL LOW (ref 22–32)
Calcium: 9.5 mg/dL (ref 8.9–10.3)
Chloride: 97 mmol/L — ABNORMAL LOW (ref 98–111)
Creatinine, Ser: 0.96 mg/dL (ref 0.44–1.00)
GFR, Estimated: >60 mL/min (ref >60)
Glucose, Bld: 164 mg/dL — ABNORMAL HIGH (ref 70–99)
Potassium: 3.5 mmol/L (ref 3.5–5.1)
Sodium: 131 mmol/L — ABNORMAL LOW (ref 135–145)
Total Bilirubin: 0.8 mg/dL (ref 0.3–1.2)
Total Protein: 6.6 g/dL (ref 6.5–8.1)

## 2021-08-03 LAB — PHOSPHORUS: Phosphorus: 2.3 mg/dL — ABNORMAL LOW (ref 2.5–4.6)

## 2021-08-03 LAB — MAGNESIUM: Magnesium: 1.2 mg/dL — ABNORMAL LOW (ref 1.7–2.4)

## 2021-08-03 NOTE — ED Notes (Signed)
Pt decided to leave. Did not want to wait any longer and was in a lot of pain

## 2021-08-03 NOTE — ED Triage Notes (Signed)
Rapid speech  this am she woke up  she was having a bad dream and she became very anxious.  She just started taking a new med for 4 days and she thinks she is having a reaction  memantine  is the druf name  she was hyperventilating sometime today  had numbness and tingling

## 2021-08-03 NOTE — ED Provider Notes (Signed)
Emergency Medicine Provider Triage Evaluation Note  Marissa [name] , a 75 y.o. female  was evaluated in triage.  Pt complains of chest pain.  Review of Systems  Positive: Chest pain, tremors, anxiety, vivid dream, hyperventilating Negative: Fever, cough  Physical Exam  BP (!) 170/76 (BP Location: Right Arm)   Pulse 68   Temp 98.9 F (37.2 C) (Oral)   Resp 20   SpO2 100%  Gen:   Awake, rapid speech Resp:  Normal effort  MSK:   Moves extremities without difficulty  Other:    Medical Decision Making  Medically screening exam initiated at 4:09 PM.  Appropriate orders placed.  Marissa [name] was informed that the remainder of the evaluation will be completed by another provider, this initial triage assessment does not replace that evaluation, and the importance of remaining in the ED until their evaluation is complete.  Pt recently started on Namenda for alzheimer.  Have been taking it for the past 3-4 days.  Started having vivid dreams, tremors, chest pressure, trouble breathing that started today.    Domenic Moras, PA-C 08/03/21 1611    Regan Lemming, MD 08/03/21 [phone]

## 2021-08-04 ENCOUNTER — Encounter: Payer: Self-pay | Admitting: Internal Medicine

## 2021-08-06 ENCOUNTER — Other Ambulatory Visit: Payer: Self-pay

## 2021-08-06 ENCOUNTER — Emergency Department (HOSPITAL_COMMUNITY): Payer: Medicare Other

## 2021-08-06 ENCOUNTER — Observation Stay (HOSPITAL_COMMUNITY)
Admission: EM | Admit: 2021-08-06 | Discharge: 2021-08-09 | Disposition: A | Discharge status: Home / Self Care | Payer: Medicare Other | Attending: Student in an Organized Health Care Education/Training Program | Admitting: Student in an Organized Health Care Education/Training Program

## 2021-08-06 ENCOUNTER — Inpatient Hospital Stay (HOSPITAL_COMMUNITY): Payer: Medicare Other

## 2021-08-06 DIAGNOSIS — R109 Unspecified abdominal pain: Secondary | ICD-10-CM | POA: Diagnosis not present

## 2021-08-06 DIAGNOSIS — S2242XA Multiple fractures of ribs, left side, initial encounter for closed fracture: Secondary | ICD-10-CM | POA: Diagnosis not present

## 2021-08-06 DIAGNOSIS — R4182 Altered mental status, unspecified: Secondary | ICD-10-CM | POA: Insufficient documentation

## 2021-08-06 DIAGNOSIS — I10 Essential (primary) hypertension: Secondary | ICD-10-CM | POA: Insufficient documentation

## 2021-08-06 DIAGNOSIS — J45909 Unspecified asthma, uncomplicated: Secondary | ICD-10-CM | POA: Insufficient documentation

## 2021-08-06 DIAGNOSIS — R112 Nausea with vomiting, unspecified: Secondary | ICD-10-CM | POA: Diagnosis not present

## 2021-08-06 DIAGNOSIS — W19XXXA Unspecified fall, initial encounter: Secondary | ICD-10-CM | POA: Diagnosis not present

## 2021-08-06 DIAGNOSIS — Y92009 Unspecified place in unspecified non-institutional (private) residence as the place of occurrence of the external cause: Secondary | ICD-10-CM

## 2021-08-06 DIAGNOSIS — R296 Repeated falls: Secondary | ICD-10-CM | POA: Diagnosis not present

## 2021-08-06 DIAGNOSIS — I639 Cerebral infarction, unspecified: Secondary | ICD-10-CM | POA: Diagnosis not present

## 2021-08-06 DIAGNOSIS — E039 Hypothyroidism, unspecified: Secondary | ICD-10-CM | POA: Diagnosis not present

## 2021-08-06 DIAGNOSIS — E785 Hyperlipidemia, unspecified: Secondary | ICD-10-CM | POA: Insufficient documentation

## 2021-08-06 DIAGNOSIS — M25532 Pain in left wrist: Secondary | ICD-10-CM | POA: Diagnosis not present

## 2021-08-06 DIAGNOSIS — Z885 Allergy status to narcotic agent status: Secondary | ICD-10-CM | POA: Diagnosis not present

## 2021-08-06 DIAGNOSIS — Y900 Blood alcohol level of less than 20 mg/100 ml: Secondary | ICD-10-CM | POA: Insufficient documentation

## 2021-08-06 DIAGNOSIS — R0689 Other abnormalities of breathing: Secondary | ICD-10-CM | POA: Diagnosis not present

## 2021-08-06 DIAGNOSIS — F32A Depression, unspecified: Secondary | ICD-10-CM | POA: Diagnosis not present

## 2021-08-06 DIAGNOSIS — Z79899 Other long term (current) drug therapy: Secondary | ICD-10-CM | POA: Diagnosis not present

## 2021-08-06 DIAGNOSIS — M419 Scoliosis, unspecified: Secondary | ICD-10-CM | POA: Diagnosis not present

## 2021-08-06 DIAGNOSIS — Z20822 Contact with and (suspected) exposure to covid-19: Secondary | ICD-10-CM | POA: Diagnosis not present

## 2021-08-06 DIAGNOSIS — R064 Hyperventilation: Secondary | ICD-10-CM | POA: Diagnosis not present

## 2021-08-06 DIAGNOSIS — Z7982 Long term (current) use of aspirin: Secondary | ICD-10-CM | POA: Diagnosis not present

## 2021-08-06 DIAGNOSIS — R55 Syncope and collapse: Secondary | ICD-10-CM | POA: Diagnosis not present

## 2021-08-06 DIAGNOSIS — F0390 Unspecified dementia without behavioral disturbance: Secondary | ICD-10-CM | POA: Insufficient documentation

## 2021-08-06 DIAGNOSIS — Z880 Allergy status to penicillin: Secondary | ICD-10-CM | POA: Insufficient documentation

## 2021-08-06 DIAGNOSIS — R079 Chest pain, unspecified: Secondary | ICD-10-CM | POA: Insufficient documentation

## 2021-08-06 DIAGNOSIS — R41841 Cognitive communication deficit: Secondary | ICD-10-CM | POA: Diagnosis not present

## 2021-08-06 DIAGNOSIS — M25539 Pain in unspecified wrist: Secondary | ICD-10-CM | POA: Diagnosis not present

## 2021-08-06 DIAGNOSIS — S0990XA Unspecified injury of head, initial encounter: Secondary | ICD-10-CM | POA: Diagnosis not present

## 2021-08-06 DIAGNOSIS — R531 Weakness: Secondary | ICD-10-CM | POA: Diagnosis not present

## 2021-08-06 DIAGNOSIS — M160 Bilateral primary osteoarthritis of hip: Secondary | ICD-10-CM | POA: Diagnosis not present

## 2021-08-06 HISTORY — DX: Cerebral infarction, unspecified: I63.9

## 2021-08-06 LAB — RESP PANEL BY RT-PCR (FLU A&B, COVID) ARPGX2
Influenza A by PCR: NEGATIVE
Influenza B by PCR: NEGATIVE
SARS Coronavirus 2 by RT PCR: NEGATIVE

## 2021-08-06 LAB — COMPREHENSIVE METABOLIC PANEL
ALT: 22 U/L (ref 0–44)
AST: 36 U/L (ref 15–41)
Albumin: 4.3 g/dL (ref 3.5–5.0)
Alkaline Phosphatase: 45 U/L (ref 38–126)
Anion gap: 21 — ABNORMAL HIGH (ref 5–15)
BUN: 12 mg/dL (ref 8–23)
CO2: 18 mmol/L — ABNORMAL LOW (ref 22–32)
Calcium: 10.1 mg/dL (ref 8.9–10.3)
Chloride: 90 mmol/L — ABNORMAL LOW (ref 98–111)
Creatinine, Ser: 0.92 mg/dL (ref 0.44–1.00)
GFR, Estimated: >60 mL/min (ref >60)
Glucose, Bld: 82 mg/dL (ref 70–99)
Potassium: 4.1 mmol/L (ref 3.5–5.1)
Sodium: 129 mmol/L — ABNORMAL LOW (ref 135–145)
Total Bilirubin: 1.3 mg/dL — ABNORMAL HIGH (ref 0.3–1.2)
Total Protein: 7.1 g/dL (ref 6.5–8.1)

## 2021-08-06 LAB — CBC WITH DIFFERENTIAL/PLATELET
Abs Immature Granulocytes: 0.03 10*3/uL (ref 0.00–0.07)
Basophils Absolute: 0 10*3/uL (ref 0.0–0.1)
Basophils Relative: 0 %
Eosinophils Absolute: 0 10*3/uL (ref 0.0–0.5)
Eosinophils Relative: 0 %
HCT: 36.4 % (ref 36.0–46.0)
Hemoglobin: 13.1 g/dL (ref 12.0–15.0)
Immature Granulocytes: 0 %
Lymphocytes Relative: 6 %
Lymphs Abs: 0.6 10*3/uL — ABNORMAL LOW (ref 0.7–4.0)
MCH: 30.9 pg (ref 26.0–34.0)
MCHC: 36 g/dL (ref 30.0–36.0)
MCV: 85.8 fL (ref 80.0–100.0)
Monocytes Absolute: 0.7 10*3/uL (ref 0.1–1.0)
Monocytes Relative: 8 %
Neutro Abs: 8 10*3/uL — ABNORMAL HIGH (ref 1.7–7.7)
Neutrophils Relative %: 86 %
Platelets: 219 10*3/uL (ref 150–400)
RBC: 4.24 MIL/uL (ref 3.87–5.11)
RDW: 14 % (ref 11.5–15.5)
WBC: 9.4 10*3/uL (ref 4.0–10.5)
nRBC: 0 % (ref 0.0–0.2)

## 2021-08-06 LAB — AMMONIA: Ammonia: 12 umol/L (ref 9–35)

## 2021-08-06 LAB — I-STAT VENOUS BLOOD GAS, ED
Acid-base deficit: 2 mmol/L (ref 0.0–2.0)
Bicarbonate: 19.5 mmol/L — ABNORMAL LOW (ref 20.0–28.0)
Calcium, Ion: 1.07 mmol/L — ABNORMAL LOW (ref 1.15–1.40)
HCT: 35 % — ABNORMAL LOW (ref 36.0–46.0)
Hemoglobin: 11.9 g/dL — ABNORMAL LOW (ref 12.0–15.0)
O2 Saturation: 100 %
Potassium: 3.3 mmol/L — ABNORMAL LOW (ref 3.5–5.1)
Sodium: 124 mmol/L — ABNORMAL LOW (ref 135–145)
TCO2: 20 mmol/L — ABNORMAL LOW (ref 22–32)
pCO2, Ven: 22.9 mmHg — ABNORMAL LOW (ref 44.0–60.0)
pH, Ven: 7.539 — ABNORMAL HIGH (ref 7.250–7.430)
pO2, Ven: 188 mmHg — ABNORMAL HIGH (ref 32.0–45.0)

## 2021-08-06 LAB — ETHANOL: Alcohol, Ethyl (B): <10 mg/dL (ref <10)

## 2021-08-06 LAB — MAGNESIUM: Magnesium: 1.2 mg/dL — ABNORMAL LOW (ref 1.7–2.4)

## 2021-08-06 LAB — ACETAMINOPHEN LEVEL: Acetaminophen (Tylenol), Serum: <10 ug/mL — ABNORMAL LOW (ref 10–30)

## 2021-08-06 LAB — TROPONIN I (HIGH SENSITIVITY)
Troponin I (High Sensitivity): 9 ng/L (ref <18)
Troponin I (High Sensitivity): 10 ng/L (ref <18)

## 2021-08-06 LAB — LACTIC ACID, PLASMA
Lactic Acid, Venous: 2.2 mmol/L (ref 0.5–1.9)
Lactic Acid, Venous: 1.7 mmol/L (ref 0.5–1.9)

## 2021-08-06 LAB — LIPASE, BLOOD: Lipase: 28 U/L (ref 11–51)

## 2021-08-06 LAB — SALICYLATE LEVEL: Salicylate Lvl: <7 mg/dL — ABNORMAL LOW (ref 7.0–30.0)

## 2021-08-06 MED ORDER — EZETIMIBE 10 MG PO TABS
10.0000 mg | ORAL_TABLET | Freq: Every day | ORAL | Status: DC
  Administered 2021-08-06 – 2021-08-09 (×3): 10 mg via ORAL
  Filled 2021-08-06 (×3): qty 1

## 2021-08-06 MED ORDER — CARISOPRODOL 350 MG PO TABS
350.0000 mg | ORAL_TABLET | Freq: Three times a day (TID) | ORAL | Status: DC | PRN
  Administered 2021-08-07 – 2021-08-09 (×4): 350 mg via ORAL
  Filled 2021-08-06 (×6): qty 1

## 2021-08-06 MED ORDER — MAGNESIUM SULFATE 2 GM/50ML IV SOLN
2.0000 g | Freq: Once | INTRAVENOUS | Status: AC
  Administered 2021-08-06: 2 g via INTRAVENOUS
  Filled 2021-08-06: qty 50

## 2021-08-06 MED ORDER — ASPIRIN 81 MG PO CHEW: 81.0000 mg | CHEWABLE_TABLET | Freq: Every day | ORAL | Status: DC | PRN

## 2021-08-06 MED ORDER — MIRABEGRON ER 25 MG PO TB24
50.0000 mg | ORAL_TABLET | Freq: Every day | ORAL | Status: DC
  Administered 2021-08-06 – 2021-08-09 (×4): 50 mg via ORAL
  Filled 2021-08-06 (×3): qty 2
  Filled 2021-08-06 (×2): qty 1

## 2021-08-06 MED ORDER — GLUCERNA SHAKE PO LIQD
237.0000 mL | Freq: Every day | ORAL | Status: DC | PRN
  Filled 2021-08-06: qty 237

## 2021-08-06 MED ORDER — FAMOTIDINE 20 MG PO TABS
20.0000 mg | ORAL_TABLET | ORAL | Status: DC | PRN
  Administered 2021-08-07 – 2021-08-08 (×2): 20 mg via ORAL
  Filled 2021-08-06 (×2): qty 1

## 2021-08-06 MED ORDER — NEBIVOLOL HCL 10 MG PO TABS
10.0000 mg | ORAL_TABLET | Freq: Every day | ORAL | Status: DC
  Administered 2021-08-06 – 2021-08-09 (×4): 10 mg via ORAL
  Filled 2021-08-06 (×4): qty 1

## 2021-08-06 MED ORDER — ACETAMINOPHEN 500 MG PO TABS
1000.0000 mg | ORAL_TABLET | Freq: Once | ORAL | Status: AC
  Administered 2021-08-06: 1000 mg via ORAL
  Filled 2021-08-06: qty 2

## 2021-08-06 MED ORDER — ACETAMINOPHEN 325 MG PO TABS
650.0000 mg | ORAL_TABLET | ORAL | Status: DC | PRN
  Administered 2021-08-07 – 2021-08-09 (×2): 650 mg via ORAL
  Filled 2021-08-06 (×2): qty 2

## 2021-08-06 MED ORDER — BEMPEDOIC ACID 180 MG PO TABS: 180.0000 mg | ORAL_TABLET | Freq: Every day | ORAL | Status: DC

## 2021-08-06 MED ORDER — SENNOSIDES-DOCUSATE SODIUM 8.6-50 MG PO TABS: 1.0000 | ORAL_TABLET | Freq: Every evening | ORAL | Status: DC | PRN

## 2021-08-06 MED ORDER — CLOPIDOGREL BISULFATE 75 MG PO TABS
75.0000 mg | ORAL_TABLET | Freq: Every day | ORAL | Status: DC
  Administered 2021-08-06 – 2021-08-07 (×2): 75 mg via ORAL
  Filled 2021-08-06 (×2): qty 1

## 2021-08-06 MED ORDER — MECLIZINE HCL 12.5 MG PO TABS: 12.5000 mg | ORAL_TABLET | Freq: Three times a day (TID) | ORAL | Status: DC | PRN

## 2021-08-06 MED ORDER — AMLODIPINE BESYLATE 10 MG PO TABS
10.0000 mg | ORAL_TABLET | Freq: Every day | ORAL | Status: DC
  Administered 2021-08-06 – 2021-08-09 (×4): 10 mg via ORAL
  Filled 2021-08-06: qty 1
  Filled 2021-08-06 (×2): qty 2
  Filled 2021-08-06: qty 1

## 2021-08-06 MED ORDER — ACETAMINOPHEN 160 MG/5ML PO SOLN: 650.0000 mg | ORAL | Status: DC | PRN

## 2021-08-06 MED ORDER — LIDOCAINE 5 % EX PTCH
1.0000 | MEDICATED_PATCH | CUTANEOUS | Status: DC
  Administered 2021-08-06 – 2021-08-08 (×3): 1 via TRANSDERMAL
  Filled 2021-08-06 (×3): qty 1

## 2021-08-06 MED ORDER — MONTELUKAST SODIUM 10 MG PO TABS
10.0000 mg | ORAL_TABLET | Freq: Every day | ORAL | Status: DC
  Administered 2021-08-06: 10 mg via ORAL
  Filled 2021-08-06 (×2): qty 1

## 2021-08-06 MED ORDER — FLUOXETINE HCL 20 MG PO CAPS
40.0000 mg | ORAL_CAPSULE | Freq: Two times a day (BID) | ORAL | Status: DC
  Administered 2021-08-06 – 2021-08-09 (×6): 40 mg via ORAL
  Filled 2021-08-06 (×6): qty 2

## 2021-08-06 MED ORDER — ZOLPIDEM TARTRATE 5 MG PO TABS
5.0000 mg | ORAL_TABLET | Freq: Every evening | ORAL | Status: DC | PRN
  Administered 2021-08-06: 5 mg via ORAL
  Filled 2021-08-06: qty 1

## 2021-08-06 MED ORDER — ENOXAPARIN SODIUM 40 MG/0.4ML IJ SOSY
40.0000 mg | PREFILLED_SYRINGE | INTRAMUSCULAR | Status: DC
  Administered 2021-08-06 – 2021-08-08 (×3): 40 mg via SUBCUTANEOUS
  Filled 2021-08-06 (×3): qty 0.4

## 2021-08-06 MED ORDER — SODIUM CHLORIDE 0.9 % IV BOLUS
1000.0000 mL | Freq: Once | INTRAVENOUS | Status: AC
  Administered 2021-08-06: 1000 mL via INTRAVENOUS

## 2021-08-06 MED ORDER — STROKE: EARLY STAGES OF RECOVERY BOOK
Freq: Once | Status: AC
  Filled 2021-08-06: qty 1

## 2021-08-06 MED ORDER — ACETAMINOPHEN 650 MG RE SUPP: 650.0000 mg | RECTAL | Status: DC | PRN

## 2021-08-06 MED ORDER — LEVOTHYROXINE SODIUM 25 MCG PO TABS
125.0000 ug | ORAL_TABLET | Freq: Every day | ORAL | Status: DC
  Administered 2021-08-08 – 2021-08-09 (×2): 125 ug via ORAL
  Filled 2021-08-06 (×2): qty 1

## 2021-08-06 MED ORDER — LEVALBUTEROL HCL 0.63 MG/3ML IN NEBU: 0.6300 mg | INHALATION_SOLUTION | Freq: Three times a day (TID) | RESPIRATORY_TRACT | Status: DC | PRN

## 2021-08-06 MED ORDER — POTASSIUM & SODIUM PHOSPHATES 280-160-250 MG PO PACK
1.0000 | PACK | Freq: Three times a day (TID) | ORAL | Status: AC
  Administered 2021-08-06 – 2021-08-07 (×2): 1 via ORAL
  Filled 2021-08-06 (×3): qty 1

## 2021-08-06 NOTE — Telephone Encounter (Signed)
Called patient's husband. LVM asking him to return my call. Pt is currently at the ED

## 2021-08-06 NOTE — ED Provider Notes (Signed)
[facility] EMERGENCY DEPARTMENT Provider Note   CSN: [id number] Arrival date & time: 08/06/21  1228     History Chief Complaint  Patient presents with   Fall   Nausea   Emesis   Weakness   Loss of Consciousness    Marissa [name] is a 75 y.o. female who presents with her husband at the bedside for concern for altered mental status and multiple falls in the last 72 hours.  Patient also had 1 syncopal this morning witnessed by her husband who caught her before she fell to the ground.  He did find her lying on the floor in the middle of the night screaming after she tried to get up to go to the bathroom and fell.  According to her husband she has been emotionally labile, aggressive, confused.  Does not have a history of recurrent falls prior to the last few months but does have some early dementia.  Patient is anxious, hyperventilating but speaking very calmly and clearly at time of my exam.  Does appear somewhat disoriented and is verbally aggressive with her husband.  I personally reads patient's medical records.  She has history of hypertension, aortic stenosis, migraines, ulcerative colitis.  She is not anticoagulated.  HPI     Past Medical History:  Diagnosis Date   Arthritis    Asthma    Bicuspid aortic valve 05/2017   Likely functional bicuspid aortic valve with sclerosis and no stenosis.   Bursitis    Cataract    mild   Dysthymia    Hemorrhoids    Hx of ulcerative colitis    per dr Arnoldo Morale as per pt.   Hyperlipidemia    Hypertension    managed - labile.   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 Roxan Hockey)   Thyroid disease     Patient Active Problem List   Diagnosis Date Noted   Chronic cough 06/01/2021   Memory  loss 07/15/2020   Malnutrition ([place]) 12/22/2018   Hypokalemia 12/22/2018   Hypomagnesemia 12/22/2018   Hypophosphatemia 12/22/2018   Anxiety    Prolonged QT interval 12/21/2018   Hyponatremia 12/21/2018   Hyperlipidemia with target LDL less than 100 10/02/2018   Encounter for chronic pain management 11/28/2017   Dyspnea on exertion 06/01/2017   Mild aortic stenosis 05/27/2017   Routine general medical examination at a health care facility 07/09/2016   Recurrent falls 03/28/2016   Rosacea 08/18/2015   Lumbar radiculopathy 07/07/2015   Urinary incontinence 06/20/2015   Hemorrhoids 10/06/2014   Cough variant asthma vs UACS 08/03/2014   Labile hypertension 02/22/2014   Insomnia secondary to chronic pain 07/09/2012   B12 deficiency 04/03/2011   Unspecified vitamin D deficiency 07/25/2010   ANEMIA, IRON DEFICIENCY 04/25/2009   Allergic rhinitis 02/01/2009   Mild dilation of ascending aorta (HCC) 12/27/2008   Chronic fatigue syndrome 04/21/2008   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  Normal sinus rhythm with rare bradycardia and tachycardia.  Heart rate ranged from 55-110 bpm.  Occasional PACs and PVCs, every single 1 was felt.  No arrhythmias other than one short 4 beat run of PACs.   COLONOSCOPY  02-04-2005   all normal    FOOT SURGERY     3 pins in toes    HAND SURGERY     left thumb joint resection   nasal revision     NM MYOVIEW LTD  07/2017   LOW RISK study.  No ischemia or infarction.  EF greater than 65%.    SINUS IRRIGATION     TONSILLECTOMY AND ADENOIDECTOMY     TRANSTHORACIC ECHOCARDIOGRAM  06/'1, 8/'19   a) Moderate LVH. Normal EF 60-65%. Normal  diastolic parameters. --> Difficult to fully visualize the aortic valve. Cannot exclude bicuspid valve. Mild aortic stenosis noted. No PFO. Mildly dilated left atrium. Trivial MR. No comment on mitral valve prolapse. Moderately dilated ascending aorta.;; b) F/u Echo To evaluate the aortic valve. -- Bicuspid AoV - mildly thickened / calcified. - No stenosis   TRANSTHORACIC ECHOCARDIOGRAM  11/2018   Normal LV size and function.  EF 60-65%.  GR 1 DD.  Normal RV size and function.  Moderate aortic valve calcification with mild Aortic Stenosis.  Normal LV size and function.  EF 60-65%.  GR 1 DD.  Normal RV size and function.  Moderate aortic calcification with mild stenosis.  Mild dilation of ascending aorta ~4.2 mm   TRANSTHORACIC ECHOCARDIOGRAM  08/15/2020   EF 60 to 65%.  No R WMA.  GRII DD.  Mild Aortic Stenosis (mean gradient 10 mmHg).  Moderate to severe aortic root dilation (45 mm) normal IVC.  (Stable compared to February 2020)      OB History   No obstetric history on file.     Family History  Problem Relation Age of Onset   Arthritis Mother    Leukemia Father    Heart disease Maternal Uncle    Diabetes Paternal Aunt    Breast cancer Maternal Grandmother    Heart disease Maternal Grandfather    Breast cancer Paternal Grandmother    Colon cancer Neg Hx     Social History   Tobacco Use   Smoking status: Never   Smokeless tobacco: Never  Vaping Use   Vaping Use: Never used  Substance Use Topics   Alcohol use: Yes    Comment: occ    Drug use: No    Home Medications Prior to Admission medications   Medication Sig Start Date End Date Taking? Authorizing Provider  ALPRAZolam Duanne Moron) 0.25 MG tablet TAKE 1 TABLET(0.25 MG) BY MOUTH DAILY AS NEEDED FOR ANXIETY 12/11/20   [name], MD  amLODipine (NORVASC) 10 MG tablet TAKE 1 TABLET(10 MG) BY MOUTH DAILY 06/25/21   [name], MD  Bempedoic Acid (NEXLETOL) 180 MG TABS Take 180 mg by mouth daily. 12/22/20   [name], MD  benzonatate (TESSALON PERLES) 100 MG capsule Take 1 capsule (100 mg total) by mouth 3 (three) times daily as needed. 05/17/21   Lucretia Kern, DO  carisoprodol (SOMA) 350 MG tablet TAKE 1 TABLET(350 MG) BY MOUTH THREE TIMES DAILY AS NEEDED FOR MUSCLE SPASMS 03/30/21   [name], MD  cloNIDine (CATAPRES) 0.2 MG tablet Take 0.5 tablets (0.1 mg total) by mouth 2 (two) times daily. May take an additional  0.1 mg (1/2 tablet  of 0.2 mg) if blood pressure is great than 678 systolic  for more than an hour Patient taking differently: Take 0.1 mg by mouth 2 (two) times daily as needed. if blood pressure is great than 938 systolic for more than an hour 12/21/20   [name], MD  Eszopiclone 3 MG TABS TAKE 1 TABLET BY MOUTH AT BEDTIME 03/30/21   [name], MD  ezetimibe (ZETIA) 10 MG tablet TAKE 1 TABLET(10 MG) BY MOUTH DAILY 02/26/21   [name], MD  famotidine (PEPCID) 20 MG tablet Take 20 mg by mouth as needed.    [provider]  feeding supplement, GLUCERNA SHAKE, (GLUCERNA SHAKE) LIQD Take 237 mLs by mouth daily.    [provider]  FLUoxetine (PROZAC) 40 MG capsule TAKE 1 CAPSULE(40 MG) BY MOUTH TWICE DAILY 04/25/21   [name], MD  levalbuterol [facility] HFA) 45 MCG/ACT inhaler Inhale 1-2 puffs into the lungs every 8 (eight) hours as needed for wheezing. 05/17/20   Janith Lima, MD  levothyroxine (SYNTHROID) 125 MCG tablet TAKE 1 TABLET(125 MCG) BY MOUTH DAILY 03/14/21   [name], MD  memantine (NAMENDA) 5 MG tablet Take 1 tablet (38m at night) for 2 weeks, then increase to 1 tablet (5 mg) twice a day 07/26/21   WRondel Jumbo PA-C  MINIVELLE 0.05 MG/24HR patch Place 1 patch onto the skin 2 (two) times a week.  09/04/14   [provider]  montelukast (SINGULAIR) 10 MG tablet TAKE 1 TABLET(10 MG) BY MOUTH AT BEDTIME 06/25/21   CHoyt Koch MD  MYRBETRIQ 50 MG TB24 tablet TAKE 1 TABLET(50 MG) BY MOUTH DAILY  09/27/20   CHoyt Koch MD  nebivolol (BYSTOLIC) 10 MG tablet TAKE 1 TABLET(10 MG) BY MOUTH DAILY AT 8 PM 06/25/21   HLeonie Man MD  Respiratory Therapy Supplies (FLUTTER) DEVI Use as directed 11/18/16   WTanda Rockers MD    Allergies    Fentanyl, Rosuvastatin, Amoxicillin, Hctz [hydrochlorothiazide], Hydralazine, Codeine, Conjugated estrogens, Erythromycin, and Piroxicam  Review of Systems   Review of Systems  Constitutional:  Positive for appetite change and fatigue. Negative for activity change, chills, diaphoresis and fever.  HENT: Negative.    Eyes: Negative.   Respiratory: Negative.    Cardiovascular: Negative.   Gastrointestinal:  Positive for nausea and vomiting. Negative for abdominal distention, abdominal pain, constipation and diarrhea.  Genitourinary: Negative.   Musculoskeletal:  Positive for arthralgias.  Skin: Negative.   Neurological:  Positive for syncope and weakness. Negative for dizziness, light-headedness, numbness and headaches.   Physical Exam Updated Vital Signs SpO2 100%   Physical Exam Vitals and nursing note reviewed.  Constitutional:      Appearance: She is not toxic-appearing.  HENT:     Head: Normocephalic and atraumatic.     Nose: Nose normal. No congestion.     Mouth/Throat:     Mouth: Mucous membranes are moist.     Pharynx: Oropharynx is clear. Uvula midline. No oropharyngeal exudate or posterior oropharyngeal erythema.     Tonsils: No tonsillar exudate.  Eyes:     General: Lids are normal. Vision grossly intact.        Right eye: No discharge.        Left eye: No discharge.     Extraocular Movements: Extraocular movements intact.     Conjunctiva/sclera: Conjunctivae normal.     Pupils: Pupils are equal, round, and reactive to light.  Neck:  Trachea: Trachea and phonation normal.  Cardiovascular:     Rate and Rhythm: Normal rate and regular rhythm.     Pulses: Normal pulses.     Heart sounds: Normal heart sounds. No  murmur heard. Pulmonary:     Effort: Pulmonary effort is normal. No tachypnea, bradypnea, accessory muscle usage, prolonged expiration or respiratory distress.     Breath sounds: Normal breath sounds. No wheezing or rales.  Chest:     Chest wall: No mass, lacerations, deformity, swelling, tenderness, crepitus or edema.  Abdominal:     General: Bowel sounds are normal. There is no distension.     Palpations: Abdomen is soft.     Tenderness: There is no abdominal tenderness. There is no right CVA tenderness, left CVA tenderness, guarding or rebound.  Musculoskeletal:        General: No deformity.       Hands:     Cervical back: Normal range of motion and neck supple. No edema, rigidity, tenderness or crepitus. No pain with movement, spinous process tenderness or muscular tenderness.     Right lower leg: No edema.     Left lower leg: No edema.  Lymphadenopathy:     Cervical: No cervical adenopathy.  Skin:    General: Skin is warm and dry.     Capillary Refill: Capillary refill takes less than 2 seconds.  Neurological:     General: No focal deficit present.     Mental Status: She is alert and oriented to person, place, and time. Mental status is at baseline.     GCS: GCS eye subscore is 4. GCS verbal subscore is 5. GCS motor subscore is 6.     Cranial Nerves: Cranial nerves are intact.     Sensory: Sensation is intact.     Motor: Motor function is intact.     Coordination: Coordination is intact.     Gait: Gait is intact.  Psychiatric:        Mood and Affect: Mood normal.    ED Results / Procedures / Treatments   Labs (all labs ordered are listed, but only abnormal results are displayed) Labs Reviewed - No data to display  EKG None  Radiology No results found.  Procedures Procedures   Medications Ordered in ED Medications - No data to display  ED Course  I have reviewed the triage vital signs and the nursing notes.  Pertinent labs & imaging results that were  available during my care of the patient were reviewed by me and considered in my medical decision making (see chart for details).    MDM Rules/Calculators/A&P                         75 year old femal who presents with concern for AMS and multiple falls.   The differential diagnosis for AMS is extensive and includes, but is not limited to:  Drug overdose - opioids, alcohol, sedatives, antipsychotics, drug withdrawal, others Metabolic: hypoxia, hypoglycemia, hyperglycemia, hypercalcemia, hypernatremia, hyponatremia, uremia, hepatic encephalopathy, hypothyroidism, hyperthyroidism, vitamin B12 or thiamine deficiency, carbon monoxide poisoning, Wilson's disease, Lactic acidosis, DKA/HHOS Infectious: meningitis, encephalitis, bacteremia/sepsis, urinary tract infection, pneumonia, neurosyphilis Structural: Space-occupying lesion, (brain tumor, subdural hematoma, hydrocephalus,) Vascular: stroke, subarachnoid hemorrhage, coronary ischemia, hypertensive encephalopathy, CNS vasculitis, thrombotic thrombocytopenic purpura, disseminated intravascular coagulation, hyperviscosity Psychiatric: Schizophrenia, depression; Other: Seizure, hypothermia, heat stroke, ICU psychosis, dementia -"sundowning."  VS normal on intake. Cardiopullmonary exam is normal. Abdominal exam is benign. Neuro exam is without  focal deficit. Neurovascularly intact in all 4 extremities. Afebrile.   DG wrist negative for acute fracture or dislocation. DG ribs with old rib fracutre and old clavicular fracture.   Care of this patient signed out to oncoming [place], PA-C at time of shift change. All pertinent HPI, physical exam, and imaging discussed with her prior to my departure. Patient pending all labs and CT scans at time of shift change. Will likely require admission to the hospital for AMS.   This chart was dictated using voice recognition software, Dragon. Despite the best efforts of this provider to proofread and correct  errors, errors may still occur which can change documentation meaning.  Final Clinical Impression(s) / ED Diagnoses Final diagnoses:  None    Rx / DC Orders ED Discharge Orders     None        Emeline Darling, PA-C 08/06/21 1749    Blanchie Dessert, MD 08/12/21 [phone]

## 2021-08-06 NOTE — ED Notes (Signed)
Patient transported to X-ray 

## 2021-08-06 NOTE — H&P (Addendum)
History and Physical    Marissa [name] NWG:[id number] DOB: July 23, 1946 DOA: 08/06/2021  PCP: [name], MD (Confirm with patient/family/NH records and if not entered, this has to be entered at [facility] point of entry) Patient coming from: Home  I have personally briefly reviewed patient's old medical records in [place]  Chief Complaint: Feeling dizzy  HPI: Marissa [name] is a 75 y.o. female with medical history significant of mild cognitive impairment, asthma, anxiety/depression, HTN, HLD, presented with repeated falls, feeling dizzy, and frequent nightmares.  Patient was started on Namenda Monday last week for mild cognitive impairment by neurology.  She felt fine for 3 days then starting on Thursday, she began to have severe nightmares, feeling dizzy, nausea and vomiting, and shortness of breath.  She came to ED last Friday, however left without completing the ED workup.   She stopped taking the Namenda on Friday, but still continue to experience frequent dizziness.  She also had episode of nauseous and vomiting of stomach content, no abdominal pain.  This morning she fell down 2 times.  First time witnessed by husband who was able to hold her down on the floor and second husband heard a loud thump and went down to see her on the floor.  She reported hitting back of her head and neck.  Patient also complaining about worsening of left rib cage pain and could not take deep breath.  She said she sustained a fall in summer and hit her left chest and had several rib fractures.  ED Course: Heart rate, blood pressure within normal limits, CT head showed subacute right parietal stroke.  Patient however denied any one-sided weakness or numbness.  Blood work sodium 129, creatinine 0.9, hemoglobin 13.1, lactic acid 2.2.  Review of Systems: As per HPI otherwise 14 point review of systems negative.    Past Medical History:  Diagnosis Date   Arthritis    Asthma    Bicuspid aortic  valve 05/2017   Likely functional bicuspid aortic valve with sclerosis and no stenosis.   Bursitis    Cataract    mild   Dysthymia    Hemorrhoids    Hx of ulcerative colitis    per dr Arnoldo Morale as per pt.   Hyperlipidemia    Hypertension    managed - labile.   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 Roxan Hockey)   Thyroid disease     Past Surgical History:  Procedure Laterality Date   ABDOMINAL HYSTERECTOMY     BREAST EXCISIONAL BIOPSY Left    BUNIONECTOMY     Cardiac Event Monitor  07/2017   Overall relatively normal.  Normal sinus rhythm with rare bradycardia and tachycardia.  Heart rate ranged from 55-110 bpm.  Occasional PACs and PVCs, every single 1 was felt.  No arrhythmias other than one short 4 beat run of PACs.   COLONOSCOPY  02-04-2005   all normal    FOOT SURGERY     3 pins in toes    HAND SURGERY     left thumb joint resection   nasal revision     NM MYOVIEW LTD  07/2017   LOW RISK study.  No ischemia  or infarction.  EF greater than 65%.    SINUS IRRIGATION     TONSILLECTOMY AND ADENOIDECTOMY     TRANSTHORACIC ECHOCARDIOGRAM  06/'1, 8/'19   a) Moderate LVH. Normal EF 60-65%. Normal diastolic parameters. --> Difficult to fully visualize the aortic valve. Cannot exclude bicuspid valve. Mild aortic stenosis noted. No PFO. Mildly dilated left atrium. Trivial MR. No comment on mitral valve prolapse. Moderately dilated ascending aorta.;; b) F/u Echo To evaluate the aortic valve. -- Bicuspid AoV - mildly thickened / calcified. - No stenosis   TRANSTHORACIC ECHOCARDIOGRAM  11/2018   Normal LV size and function.  EF 60-65%.  GR 1 DD.  Normal RV size and function.  Moderate aortic valve calcification with mild Aortic Stenosis.  Normal LV size and  function.  EF 60-65%.  GR 1 DD.  Normal RV size and function.  Moderate aortic calcification with mild stenosis.  Mild dilation of ascending aorta ~4.2 mm   TRANSTHORACIC ECHOCARDIOGRAM  08/15/2020   EF 60 to 65%.  No R WMA.  GRII DD.  Mild Aortic Stenosis (mean gradient 10 mmHg).  Moderate to severe aortic root dilation (45 mm) normal IVC.  (Stable compared to February 2020)      reports that she has never smoked. She has never used smokeless tobacco. She reports current alcohol use. She reports that she does not use drugs.  Allergies  Allergen Reactions   Fentanyl Shortness Of Breath and Rash   Rosuvastatin Anaphylaxis, Anxiety, Hives, Itching, Nausea And Vomiting, Other (See Comments), Palpitations, Photosensitivity and Shortness Of Breath   Amoxicillin Diarrhea    Severe diarrhea, rash to vaginal area with swelling    Hctz [Hydrochlorothiazide] Other (See Comments)    HypoNatremia   Hydralazine     Weak, sweats, red skin   Codeine Other (See Comments)    makes her hyper   Conjugated Estrogens Itching and Rash   Erythromycin Rash    had a rash with emycin, has done ok with other meds in it's class   Piroxicam Itching and Rash    Feldene    Family History  Problem Relation Age of Onset   Arthritis Mother    Leukemia Father    Heart disease Maternal Uncle    Diabetes Paternal Aunt    Breast cancer Maternal Grandmother    Heart disease Maternal Grandfather    Breast cancer Paternal Grandmother    Colon cancer Neg Hx      Prior to Admission medications   Medication Sig Start Date End Date Taking? Authorizing Provider  amLODipine (NORVASC) 10 MG tablet TAKE 1 TABLET(10 MG) BY MOUTH DAILY Patient taking differently: Take 10 mg by mouth daily. 06/25/21  Yes [name], MD  aspirin 81 MG chewable tablet Chew 81 mg by mouth daily as needed for mild pain (chest pain).   Yes [provider]  Bempedoic Acid (NEXLETOL) 180 MG TABS Take 180 mg by mouth daily. 12/22/20   Yes [name], MD  benzonatate (TESSALON PERLES) 100 MG capsule Take 1 capsule (100 mg total) by mouth 3 (three) times daily as needed. Patient taking differently: Take 100 mg by mouth 3 (three) times daily as needed for cough. 05/17/21  Yes Lucretia Kern, DO  carisoprodol (SOMA) 350 MG tablet TAKE 1 TABLET(350 MG) BY MOUTH THREE TIMES DAILY AS NEEDED FOR MUSCLE SPASMS Patient taking differently: Take 350 mg by mouth 3 (three) times daily as needed for muscle spasms. 03/30/21  Yes [name], MD  cloNIDine (CATAPRES) 0.2 MG tablet Take 0.5 tablets (0.1 mg total) by mouth 2 (two) times daily. May take an additional  0.1 mg (1/2 tablet of 0.2 mg) if blood pressure is great than 194 systolic  for more than an hour Patient taking differently: Take 0.1 mg by mouth 2 (two) times daily as needed (systolic over 174). if blood pressure is great than 081 systolic for more than an hour 12/21/20  Yes [name], MD  Eszopiclone 3 MG TABS TAKE 1 TABLET BY MOUTH AT BEDTIME Patient taking differently: Take 3 mg by mouth at bedtime. 03/30/21  Yes [name], MD  ezetimibe (ZETIA) 10 MG tablet TAKE 1 TABLET(10 MG) BY MOUTH DAILY Patient taking differently: Take 10 mg by mouth daily. 02/26/21  Yes [name], MD  famotidine (PEPCID) 20 MG tablet Take 20 mg by mouth as needed for heartburn or indigestion.   Yes [provider]  feeding supplement, GLUCERNA SHAKE, (GLUCERNA SHAKE) LIQD Take 237 mLs by mouth daily as needed (if not eating meals).   Yes [provider]  FLUoxetine (PROZAC) 40 MG capsule TAKE 1 CAPSULE(40 MG) BY MOUTH TWICE DAILY Patient taking differently: Take 40 mg by mouth 2 (two) times daily. 04/25/21  Yes [name], MD  levalbuterol [facility] HFA) 45 MCG/ACT inhaler Inhale 1-2 puffs into the lungs every 8 (eight) hours as needed for wheezing. 05/17/20  Yes Janith Lima, MD  levothyroxine (SYNTHROID) 125 MCG tablet TAKE 1 TABLET(125 MCG)  BY MOUTH DAILY Patient taking differently: Take 125 mcg by mouth daily before breakfast. 03/14/21  Yes [name], MD  montelukast (SINGULAIR) 10 MG tablet TAKE 1 TABLET(10 MG) BY MOUTH AT BEDTIME Patient taking differently: Take 10 mg by mouth at bedtime. 06/25/21  Yes [name], MD  MYRBETRIQ 50 MG TB24 tablet TAKE 1 TABLET(50 MG) BY MOUTH DAILY Patient taking differently: Take 50 mg by mouth daily. 09/27/20  Yes [name], MD  nebivolol (BYSTOLIC) 10 MG tablet TAKE 1 TABLET(10 MG) BY MOUTH DAILY AT 8 PM Patient taking differently: Take 10 mg by mouth daily. 06/25/21  Yes [name], MD  ALPRAZolam Duanne Moron) 0.25 MG tablet TAKE 1 TABLET(0.25 MG) BY MOUTH DAILY AS NEEDED FOR ANXIETY Patient not taking: No sig reported 12/11/20   [name], MD  memantine (NAMENDA) 5 MG tablet Take 1 tablet (53m at night) for 2 weeks, then increase to 1 tablet (5 mg) twice a day Patient not taking: No sig reported 07/26/21   WRondel Jumbo PA-C  MINIVELLE 0.05 MG/24HR patch Place 1 patch onto the skin 2 (two) times a week.  09/04/14   [provider]  Respiratory Therapy Supplies (FLUTTER) DEVI Use as directed 11/18/16   WTanda Rockers MD    Physical Exam: Vitals:   08/06/21 1530 08/06/21 1545 08/06/21 1600 08/06/21 1700  BP: (!) 139/101 126/86 (!) 152/76 (!) 160/78  Pulse: 60 (!) 59 (!) 57 (!) 50  Resp: 20 (!) 27 16 14   Temp:      TempSrc:      SpO2: 100% 100% 100% 100%  Weight:      Height:        Constitutional: NAD, calm, comfortable Vitals:   08/06/21 1530 08/06/21 1545 08/06/21 1600 08/06/21 1700  BP: (!) 139/101 126/86 (!) 152/76 (!) 160/78  Pulse: 60 (!) 59 (!) 57 (!) 50  Resp: 20 (!) 27 16 14   Temp:  TempSrc:      SpO2: 100% 100% 100% 100%  Weight:      Height:       Eyes: PERRL, lids and conjunctivae normal ENMT: Mucous membranes are moist. Posterior pharynx clear of any exudate or lesions.Normal dentition.  Neck: normal,  supple, no masses, no thyromegaly Respiratory: clear to auscultation bilaterally, no wheezing, no crackles. Normal respiratory effort. No accessory muscle use.  Tenderness on left-sided rib cage Cardiovascular: Regular rate and rhythm, no murmurs / rubs / gallops. No extremity edema. 2+ pedal pulses. No carotid bruits.  Abdomen: no tenderness, no masses palpated. No hepatosplenomegaly. Bowel sounds positive.  Musculoskeletal: no clubbing / cyanosis. No joint deformity upper and lower extremities. Good ROM, no contractures. Normal muscle tone.  Skin: no rashes, lesions, ulcers. No induration Neurologic: CN 2-12 grossly intact. Sensation intact, DTR normal. Strength 5/5 in all 4. Fine tremors on the B/L finger tips Psychiatric: Normal judgment and insight. Alert and oriented x 3. Normal mood.    Labs on Admission: I have personally reviewed following labs and imaging studies  CBC: Recent Labs  Lab 08/03/21 1615 08/06/21 1455  WBC 5.5 9.4  NEUTROABS 3.9 8.0*  HGB 11.9* 13.1  HCT 34.9* 36.4  MCV 89.3 85.8  PLT 192 270   Basic Metabolic Panel: Recent Labs  Lab 08/03/21 1615 08/06/21 1455  NA 131* 129*  K 3.5 4.1  CL 97* 90*  CO2 21* 18*  GLUCOSE 164* 82  BUN 14 12  CREATININE 0.96 0.92  CALCIUM 9.5 10.1  MG 1.2*  --   PHOS 2.3*  --    GFR: Estimated Creatinine Clearance: 45.9 mL/min (by C-G formula based on SCr of 0.92 mg/dL). Liver Function Tests: Recent Labs  Lab 08/03/21 1615 08/06/21 1455  AST 37 36  ALT 22 22  ALKPHOS 45 45  BILITOT 0.8 1.3*  PROT 6.6 7.1  ALBUMIN 4.2 4.3   Recent Labs  Lab 08/06/21 1455  LIPASE 28   No results for input(s): AMMONIA in the last 168 hours. Coagulation Profile: No results for input(s): INR, PROTIME in the last 168 hours. Cardiac Enzymes: No results for input(s): CKTOTAL, CKMB, CKMBINDEX, TROPONINI in the last 168 hours. BNP (last 3 results) No results for input(s): PROBNP in the last 8760 hours. HbA1C: No results for  input(s): HGBA1C in the last 72 hours. CBG: No results for input(s): GLUCAP in the last 168 hours. Lipid Profile: No results for input(s): CHOL, HDL, LDLCALC, TRIG, CHOLHDL, LDLDIRECT in the last 72 hours. Thyroid Function Tests: No results for input(s): TSH, T4TOTAL, FREET4, T3FREE, THYROIDAB in the last 72 hours. Anemia Panel: No results for input(s): VITAMINB12, FOLATE, FERRITIN, TIBC, IRON, RETICCTPCT in the last 72 hours. Urine analysis:    Component Value Date/Time   COLORURINE YELLOW 12/21/2018 1926   APPEARANCEUR CLEAR 12/21/2018 1926   LABSPEC 1.006 12/21/2018 1926   PHURINE 6.0 12/21/2018 [place] NEGATIVE 12/21/2018 [name] NEGATIVE 12/21/2018 [place] NEGATIVE 12/21/2018 1926   BILIRUBINUR 1+ 12/17/2011 1704   KETONESUR 5 (A) 12/21/2018 1926   PROTEINUR NEGATIVE 12/21/2018 1926   UROBILINOGEN 0.2 12/17/2011 1704   NITRITE NEGATIVE 12/21/2018 1926   LEUKOCYTESUR NEGATIVE 12/21/2018 1926    Radiological Exams on Admission: DG Ribs Unilateral W/Chest Left  Result Date: 08/06/2021 CLINICAL DATA:  Left wrist pain after falling. Weakness with syncopal episode. EXAM: LEFT RIBS AND CHEST - 3+ VIEW COMPARISON:  Chest radiographs 08/03/2021.  CT 01/30/2010. FINDINGS: The heart  size and mediastinal contours are stable. The lungs are clear. There is no pleural effusion or pneumothorax. There are multiple old left-sided rib fractures, involving the left 7th through 10th ribs posteriorly. No acute rib fractures are identified. There is a mildly displaced and comminuted fracture of the distal right clavicle which appears new from 05/29/2021. IMPRESSION: 1. No evidence of acute rib fracture, pleural effusion or pneumothorax. 2. Multiple left-sided rib fractures are unchanged. A fracture of the distal right clavicle is unchanged from recent prior study, although appears new from 05/29/2021. Electronically Signed   By: Richardean Sale M.D.   On: 08/06/2021 14:47   DG  Wrist Complete Left  Result Date: 08/06/2021 CLINICAL DATA:  Fall.  Left wrist pain. EXAM: LEFT WRIST - COMPLETE 3+ VIEW COMPARISON:  Hand radiographs 10/03/2010. FINDINGS: The mineralization and alignment are normal. There is no evidence of acute fracture or dislocation. Moderately advanced degenerative changes are again noted at the 1st carpometacarpal articulation. The additional joint spaces are preserved. There is possible dorsal soft tissue swelling without evidence of foreign body. IMPRESSION: No evidence of acute fracture or dislocation. Possible dorsal soft tissue swelling with chronic degenerative changes at the base of the thumb. Electronically Signed   By: Richardean Sale M.D.   On: 08/06/2021 14:43   CT HEAD WO CONTRAST (5MM)  Result Date: 08/06/2021 CLINICAL DATA:  Head trauma. Weakness. Nausea and vomiting. Syncopal episode. EXAM: CT HEAD WITHOUT CONTRAST CT CERVICAL SPINE WITHOUT CONTRAST TECHNIQUE: Multidetector CT imaging of the head and cervical spine was performed following the standard protocol without intravenous contrast. Multiplanar CT image reconstructions of the cervical spine were also generated. COMPARISON:  10/22/2019.  Brain MRI 12/08/2020. FINDINGS: CT HEAD FINDINGS Brain: Age related volume loss. Chronic small-vessel ischemic changes affect the cerebral hemispheric white matter. There is a small right parietal cortical infarction which could be old or subacute. This was not present on an MRI study of February from this year. No evidence of mass, hemorrhage, hydrocephalus or extra-axial collection. Vascular: There is atherosclerotic calcification of the major vessels at the base of the brain. Skull: No skull fracture. Sinuses/Orbits: No fluid in the sinuses.  Orbits are negative. Other: None CT CERVICAL SPINE FINDINGS Alignment: No traumatic malalignment. Straightening of the normal cervical lordosis. 2 mm degenerative anterolisthesis C3-4. Skull base and vertebrae: No regional  fracture. Soft tissues and spinal canal: No traumatic soft tissue finding. Ordinary atherosclerotic calcification of the carotid siphons. Disc levels: Facet arthropathy on the right at C3-4, C4-5 and C7-T1 and on the left at C2-3 and C7-T1. Degenerative spondylosis with disc space narrowing and bulging of the discs at C3-4, C4-5, C5-6 and C6-7. Foraminal narrowing due to bony encroachment particularly notable on the right at C3-4 and C4-5 and on the left at C2-3, C5-6 and C6-7. Upper chest: Negative Other: None IMPRESSION: Head CT: No traumatic finding. Chronic small-vessel ischemic changes of the white matter. Small right parietal stroke that could be subacute or old, but was not present at the time of MRI done in February of this year. Cervical spine CT: No acute or traumatic finding. Chronic degenerative spondylosis and facet arthropathy as above, not significantly changed since December of 2020. Electronically Signed   By: Nelson Chimes M.D.   On: 08/06/2021 16:54   CT Cervical Spine Wo Contrast  Result Date: 08/06/2021 CLINICAL DATA:  Head trauma. Weakness. Nausea and vomiting. Syncopal episode. EXAM: CT HEAD WITHOUT CONTRAST CT CERVICAL SPINE WITHOUT CONTRAST TECHNIQUE: Multidetector CT imaging of the  head and cervical spine was performed following the standard protocol without intravenous contrast. Multiplanar CT image reconstructions of the cervical spine were also generated. COMPARISON:  10/22/2019.  Brain MRI 12/08/2020. FINDINGS: CT HEAD FINDINGS Brain: Age related volume loss. Chronic small-vessel ischemic changes affect the cerebral hemispheric white matter. There is a small right parietal cortical infarction which could be old or subacute. This was not present on an MRI study of February from this year. No evidence of mass, hemorrhage, hydrocephalus or extra-axial collection. Vascular: There is atherosclerotic calcification of the major vessels at the base of the brain. Skull: No skull fracture.  Sinuses/Orbits: No fluid in the sinuses.  Orbits are negative. Other: None CT CERVICAL SPINE FINDINGS Alignment: No traumatic malalignment. Straightening of the normal cervical lordosis. 2 mm degenerative anterolisthesis C3-4. Skull base and vertebrae: No regional fracture. Soft tissues and spinal canal: No traumatic soft tissue finding. Ordinary atherosclerotic calcification of the carotid siphons. Disc levels: Facet arthropathy on the right at C3-4, C4-5 and C7-T1 and on the left at C2-3 and C7-T1. Degenerative spondylosis with disc space narrowing and bulging of the discs at C3-4, C4-5, C5-6 and C6-7. Foraminal narrowing due to bony encroachment particularly notable on the right at C3-4 and C4-5 and on the left at C2-3, C5-6 and C6-7. Upper chest: Negative Other: None IMPRESSION: Head CT: No traumatic finding. Chronic small-vessel ischemic changes of the white matter. Small right parietal stroke that could be subacute or old, but was not present at the time of MRI done in February of this year. Cervical spine CT: No acute or traumatic finding. Chronic degenerative spondylosis and facet arthropathy as above, not significantly changed since December of 2020. Electronically Signed   By: Nelson Chimes M.D.   On: 08/06/2021 16:54    EKG: Independently reviewed. Sinus, QTC=531.  Assessment/Plan Active Problems:   Fall at home, initial encounter   Stroke (cerebrum) [facility])  (please populate well all problems here in Problem List. (For example, if patient is on BP meds at home and you resume or decide to hold them, it is a problem that needs to be her. Same for CAD, COPD, HLD and so on)   Dizziness, near syncope and fall -Likely secondary to memantine side effect, discontinue memantine -Orthostatic vital signs, PT evaluation.  As needed Meclizine -Doubt the subacute parietal stroke is responsible for her dizziness.  MRI pending to rule out other strokes. -Since April of this year, patient had tried Aricept,  rivastigmine, and now memantine, and appears that she did not tolerate either of them because of the side effects.  Subacute right parietal stroke -Neuro exam benign -Continue aspirin, add plavix, plan for dou-antiplatelet regimen for 10-14 days then back to ASA alone, check lipid panel and A1c. -MRI and MRA -Echo  Elevated lactate -No clear etiology, she is no tachycardia her BP stable.  UA pending, no treatment.  Metabolic acidosis -She is over undulating, suspect corresponding respiratory alkalosis, check VBG for pH.  Nausea and vomiting -Improved after discontinue memantine.  Correlated with the feeling of dizziness.  Abdominal exam benign.  Ordered abdomen x-ray.  COPD/asthma -Stable, check VBG for over ventilating and possible respiratory alkalosis.  Hypothyroidism -Check TSH  HTN -Continue beta-blocker.  Chronic rib fracture and chronic chest pain -Lidocaine patch.   DVT prophylaxis: Lovenox Code Status: Full code Family Communication: Husband at bedside Disposition Plan: Expect more than 2 midnight hospital stay, may need SNF. Consults called: None. Admission status: Tele admit   Lequita Halt  MD [facility] Pager [phone]  08/06/2021, 6:06 PM

## 2021-08-06 NOTE — ED Provider Notes (Signed)
Care assumed from Russell, please see her note for full details, but in brief Marissa [name] is a 75 y.o. female Who presents accompanied by her husband for evaluation of altered mental status and frequent falls.  Patient has had multiple falls in the past 72 hours and had 1 witnessed syncopal episode this morning.  He also reports that she has been very emotionally labile, intermittently aggressive and confused.  She reports she has recently been diagnosed with early dementia but these changes have been more acute compared to that progressive change which she has been followed by neurology for.  Plan: Follow-up lab work, pending lab work and CT.  Will ultimately need admission for altered mental status and frequent falls  Work-up so shows old rib fractures and clavicle fracture that were known from prior falls but no new traumatic injury.  Patient remains hemodynamically stable.  BP (!) 165/81   Pulse 62   Temp 97.9 F (36.6 C) (Oral)   Resp 20   Ht 5' 4.5" (1.638 m)   Wt 59.9 kg   SpO2 100%   BMI 22.31 kg/m    ED Course/Procedures   Labs Reviewed  COMPREHENSIVE METABOLIC PANEL - Abnormal; Notable for the following components:      Result Value   Sodium 129 (*)    Chloride 90 (*)    CO2 18 (*)    Total Bilirubin 1.3 (*)    Anion gap 21 (*)    All other components within normal limits  CBC WITH DIFFERENTIAL/PLATELET - Abnormal; Notable for the following components:   Neutro Abs 8.0 (*)    Lymphs Abs 0.6 (*)    All other components within normal limits  LACTIC ACID, PLASMA - Abnormal; Notable for the following components:   Lactic Acid, Venous 2.2 (*)    All other components within normal limits  SALICYLATE LEVEL - Abnormal; Notable for the following components:   Salicylate Lvl <5.6 (*)    All other components within normal limits  ACETAMINOPHEN LEVEL - Abnormal; Notable for the following components:   Acetaminophen (Tylenol), Serum <10 (*)    All other  components within normal limits  MAGNESIUM - Abnormal; Notable for the following components:   Magnesium 1.2 (*)    All other components within normal limits  RESP PANEL BY RT-PCR (FLU A&B, COVID) ARPGX2  CULTURE, BLOOD (ROUTINE X 2)  CULTURE, BLOOD (ROUTINE X 2)  LACTIC ACID, PLASMA  LIPASE, BLOOD  ETHANOL  AMMONIA  URINALYSIS, ROUTINE W REFLEX MICROSCOPIC  RAPID URINE DRUG SCREEN, HOSP PERFORMED  BLOOD GAS, VENOUS  HEMOGLOBIN A1C  LIPID PANEL  TSH  MAGNESIUM  BASIC METABOLIC PANEL  TROPONIN I (HIGH SENSITIVITY)  TROPONIN I (HIGH SENSITIVITY)   DG Ribs Unilateral W/Chest Left  Result Date: 08/06/2021 CLINICAL DATA:  Left wrist pain after falling. Weakness with syncopal episode. EXAM: LEFT RIBS AND CHEST - 3+ VIEW COMPARISON:  Chest radiographs 08/03/2021.  CT 01/30/2010. FINDINGS: The heart size and mediastinal contours are stable. The lungs are clear. There is no pleural effusion or pneumothorax. There are multiple old left-sided rib fractures, involving the left 7th through 10th ribs posteriorly. No acute rib fractures are identified. There is a mildly displaced and comminuted fracture of the distal right clavicle which appears new from 05/29/2021. IMPRESSION: 1. No evidence of acute rib fracture, pleural effusion or pneumothorax. 2. Multiple left-sided rib fractures are unchanged. A fracture of the distal right clavicle is unchanged from recent prior study,  although appears new from 05/29/2021. Electronically Signed   By: Richardean Sale M.D.   On: 08/06/2021 14:47   DG Wrist Complete Left  Result Date: 08/06/2021 CLINICAL DATA:  Fall.  Left wrist pain. EXAM: LEFT WRIST - COMPLETE 3+ VIEW COMPARISON:  Hand radiographs 10/03/2010. FINDINGS: The mineralization and alignment are normal. There is no evidence of acute fracture or dislocation. Moderately advanced degenerative changes are again noted at the 1st carpometacarpal articulation. The additional joint spaces are preserved. There  is possible dorsal soft tissue swelling without evidence of foreign body. IMPRESSION: No evidence of acute fracture or dislocation. Possible dorsal soft tissue swelling with chronic degenerative changes at the base of the thumb. Electronically Signed   By: Richardean Sale M.D.   On: 08/06/2021 14:43   CT HEAD WO CONTRAST (5MM)  Result Date: 08/06/2021 CLINICAL DATA:  Head trauma. Weakness. Nausea and vomiting. Syncopal episode. EXAM: CT HEAD WITHOUT CONTRAST CT CERVICAL SPINE WITHOUT CONTRAST TECHNIQUE: Multidetector CT imaging of the head and cervical spine was performed following the standard protocol without intravenous contrast. Multiplanar CT image reconstructions of the cervical spine were also generated. COMPARISON:  10/22/2019.  Brain MRI 12/08/2020. FINDINGS: CT HEAD FINDINGS Brain: Age related volume loss. Chronic small-vessel ischemic changes affect the cerebral hemispheric white matter. There is a small right parietal cortical infarction which could be old or subacute. This was not present on an MRI study of February from this year. No evidence of mass, hemorrhage, hydrocephalus or extra-axial collection. Vascular: There is atherosclerotic calcification of the major vessels at the base of the brain. Skull: No skull fracture. Sinuses/Orbits: No fluid in the sinuses.  Orbits are negative. Other: None CT CERVICAL SPINE FINDINGS Alignment: No traumatic malalignment. Straightening of the normal cervical lordosis. 2 mm degenerative anterolisthesis C3-4. Skull base and vertebrae: No regional fracture. Soft tissues and spinal canal: No traumatic soft tissue finding. Ordinary atherosclerotic calcification of the carotid siphons. Disc levels: Facet arthropathy on the right at C3-4, C4-5 and C7-T1 and on the left at C2-3 and C7-T1. Degenerative spondylosis with disc space narrowing and bulging of the discs at C3-4, C4-5, C5-6 and C6-7. Foraminal narrowing due to bony encroachment particularly notable on the  right at C3-4 and C4-5 and on the left at C2-3, C5-6 and C6-7. Upper chest: Negative Other: None IMPRESSION: Head CT: No traumatic finding. Chronic small-vessel ischemic changes of the white matter. Small right parietal stroke that could be subacute or old, but was not present at the time of MRI done in February of this year. Cervical spine CT: No acute or traumatic finding. Chronic degenerative spondylosis and facet arthropathy as above, not significantly changed since December of 2020. Electronically Signed   By: Nelson Chimes M.D.   On: 08/06/2021 16:54   CT Cervical Spine Wo Contrast  Result Date: 08/06/2021 CLINICAL DATA:  Head trauma. Weakness. Nausea and vomiting. Syncopal episode. EXAM: CT HEAD WITHOUT CONTRAST CT CERVICAL SPINE WITHOUT CONTRAST TECHNIQUE: Multidetector CT imaging of the head and cervical spine was performed following the standard protocol without intravenous contrast. Multiplanar CT image reconstructions of the cervical spine were also generated. COMPARISON:  10/22/2019.  Brain MRI 12/08/2020. FINDINGS: CT HEAD FINDINGS Brain: Age related volume loss. Chronic small-vessel ischemic changes affect the cerebral hemispheric white matter. There is a small right parietal cortical infarction which could be old or subacute. This was not present on an MRI study of February from this year. No evidence of mass, hemorrhage, hydrocephalus or extra-axial collection. Vascular: There  is atherosclerotic calcification of the major vessels at the base of the brain. Skull: No skull fracture. Sinuses/Orbits: No fluid in the sinuses.  Orbits are negative. Other: None CT CERVICAL SPINE FINDINGS Alignment: No traumatic malalignment. Straightening of the normal cervical lordosis. 2 mm degenerative anterolisthesis C3-4. Skull base and vertebrae: No regional fracture. Soft tissues and spinal canal: No traumatic soft tissue finding. Ordinary atherosclerotic calcification of the carotid siphons. Disc levels: Facet  arthropathy on the right at C3-4, C4-5 and C7-T1 and on the left at C2-3 and C7-T1. Degenerative spondylosis with disc space narrowing and bulging of the discs at C3-4, C4-5, C5-6 and C6-7. Foraminal narrowing due to bony encroachment particularly notable on the right at C3-4 and C4-5 and on the left at C2-3, C5-6 and C6-7. Upper chest: Negative Other: None IMPRESSION: Head CT: No traumatic finding. Chronic small-vessel ischemic changes of the white matter. Small right parietal stroke that could be subacute or old, but was not present at the time of MRI done in February of this year. Cervical spine CT: No acute or traumatic finding. Chronic degenerative spondylosis and facet arthropathy as above, not significantly changed since December of 2020. Electronically Signed   By: Nelson Chimes M.D.   On: 08/06/2021 16:54     Procedures  MDM   Lab work significant for mild hyponatremia, and I suspect some dehydration, CO2 of 18 with anion gap of 21, patient does have mildly elevated lactic acid of 2.2 which could be contributing to this.  No fevers or clear source of infection at this time.  Mild hypomagnesemia but otherwise no other significant electrolyte derangements.  Normal renal and liver function.  Ethanol and tox labs are negative, ammonia within normal limits.  COVID and flu testing are negative.   X-rays with prior known fractures to the ribs and clavicle but no acute injuries from falls.  CT of the head and cervical spine without acute traumatic injury but there is a likely subacute right parietal stroke that was not noted on MRI done in February.  We will proceed with MRI.  This could be contributing to altered mental status, but will need admission regardless.  Case discussed with Dr. Roosevelt Locks with [facility] hospitalist who will see and admit the patient.         Jacqlyn Larsen, PA-C 08/06/21 1924    Drenda Freeze, MD 08/06/21 [phone]

## 2021-08-06 NOTE — ED Triage Notes (Signed)
BIB EMS for weakness, N, V x 4 days. One witnessed syncope. Multiple falls last few days, unknown how many were syncope vs mechanical. Last fall today was unwitnessed, husband found him on the floor. Not on thinner. Unsure if she hit her head. Pt c/o left rib cage pain and left wrist pain. Pt has mild cognitive delay.

## 2021-08-06 NOTE — ED Notes (Signed)
Patient transported to MRI 

## 2021-08-07 ENCOUNTER — Inpatient Hospital Stay (HOSPITAL_COMMUNITY): Payer: Medicare Other

## 2021-08-07 ENCOUNTER — Inpatient Hospital Stay (HOSPITAL_BASED_OUTPATIENT_CLINIC_OR_DEPARTMENT_OTHER): Payer: Medicare Other

## 2021-08-07 ENCOUNTER — Encounter (HOSPITAL_COMMUNITY): Payer: Self-pay | Admitting: Internal Medicine

## 2021-08-07 DIAGNOSIS — Y92009 Unspecified place in unspecified non-institutional (private) residence as the place of occurrence of the external cause: Secondary | ICD-10-CM | POA: Diagnosis not present

## 2021-08-07 DIAGNOSIS — R112 Nausea with vomiting, unspecified: Secondary | ICD-10-CM | POA: Insufficient documentation

## 2021-08-07 DIAGNOSIS — I639 Cerebral infarction, unspecified: Secondary | ICD-10-CM | POA: Diagnosis not present

## 2021-08-07 DIAGNOSIS — R4182 Altered mental status, unspecified: Secondary | ICD-10-CM | POA: Insufficient documentation

## 2021-08-07 DIAGNOSIS — W19XXXA Unspecified fall, initial encounter: Secondary | ICD-10-CM | POA: Diagnosis not present

## 2021-08-07 LAB — PROTIME-INR
INR: 0.9 (ref 0.8–1.2)
Prothrombin Time: 12.6 seconds (ref 11.4–15.2)

## 2021-08-07 LAB — RAPID URINE DRUG SCREEN, HOSP PERFORMED
Amphetamines: NOT DETECTED
Barbiturates: NOT DETECTED
Benzodiazepines: POSITIVE — AB
Cocaine: NOT DETECTED
Opiates: NOT DETECTED
Tetrahydrocannabinol: NOT DETECTED

## 2021-08-07 LAB — MAGNESIUM: Magnesium: 1.7 mg/dL (ref 1.7–2.4)

## 2021-08-07 LAB — URINALYSIS, ROUTINE W REFLEX MICROSCOPIC
Bilirubin Urine: NEGATIVE
Glucose, UA: NEGATIVE mg/dL
Hgb urine dipstick: NEGATIVE
Ketones, ur: 80 mg/dL — AB
Leukocytes,Ua: NEGATIVE
Nitrite: NEGATIVE
Protein, ur: NEGATIVE mg/dL
Specific Gravity, Urine: 1.009 (ref 1.005–1.030)
pH: 5 (ref 5.0–8.0)

## 2021-08-07 LAB — BASIC METABOLIC PANEL
Anion gap: 15 (ref 5–15)
BUN: 10 mg/dL (ref 8–23)
CO2: 19 mmol/L — ABNORMAL LOW (ref 22–32)
Calcium: 9 mg/dL (ref 8.9–10.3)
Chloride: 97 mmol/L — ABNORMAL LOW (ref 98–111)
Creatinine, Ser: 0.84 mg/dL (ref 0.44–1.00)
GFR, Estimated: >60 mL/min (ref >60)
Glucose, Bld: 84 mg/dL (ref 70–99)
Potassium: 3.2 mmol/L — ABNORMAL LOW (ref 3.5–5.1)
Sodium: 131 mmol/L — ABNORMAL LOW (ref 135–145)

## 2021-08-07 LAB — APTT: aPTT: 24 seconds (ref 24–36)

## 2021-08-07 LAB — LIPID PANEL
Cholesterol: 250 mg/dL — ABNORMAL HIGH (ref 0–200)
HDL: 111 mg/dL (ref >40)
LDL Cholesterol: 109 mg/dL — ABNORMAL HIGH (ref 0–99)
Total CHOL/HDL Ratio: 2.3 RATIO
Triglycerides: 150 mg/dL — ABNORMAL HIGH (ref <150)
VLDL: 30 mg/dL (ref 0–40)

## 2021-08-07 LAB — HEMOGLOBIN A1C
Hgb A1c MFr Bld: 5.1 % (ref 4.8–5.6)
Mean Plasma Glucose: 99.67 mg/dL

## 2021-08-07 MED ORDER — INFLUENZA VAC A&B SA ADJ QUAD 0.5 ML IM PRSY
0.5000 mL | PREFILLED_SYRINGE | INTRAMUSCULAR | Status: DC
  Filled 2021-08-07: qty 0.5

## 2021-08-07 MED ORDER — PHENAZOPYRIDINE HCL 100 MG PO TABS
100.0000 mg | ORAL_TABLET | Freq: Three times a day (TID) | ORAL | Status: AC
  Administered 2021-08-07 – 2021-08-08 (×3): 100 mg via ORAL
  Filled 2021-08-07 (×3): qty 1

## 2021-08-07 MED ORDER — CLOPIDOGREL BISULFATE 75 MG PO TABS
75.0000 mg | ORAL_TABLET | Freq: Every day | ORAL | Status: DC
  Administered 2021-08-08 – 2021-08-09 (×2): 75 mg via ORAL
  Filled 2021-08-07 (×2): qty 1

## 2021-08-07 MED ORDER — PANTOPRAZOLE SODIUM 40 MG PO TBEC
40.0000 mg | DELAYED_RELEASE_TABLET | Freq: Every day | ORAL | Status: DC
  Administered 2021-08-07 – 2021-08-09 (×3): 40 mg via ORAL
  Filled 2021-08-07 (×3): qty 1

## 2021-08-07 MED ORDER — LORAZEPAM 2 MG/ML IJ SOLN
1.0000 mg | Freq: Four times a day (QID) | INTRAMUSCULAR | Status: DC | PRN
  Administered 2021-08-07 – 2021-08-08 (×2): 1 mg via INTRAVENOUS
  Filled 2021-08-07 (×2): qty 1

## 2021-08-07 MED ORDER — ASPIRIN 81 MG PO CHEW
81.0000 mg | CHEWABLE_TABLET | Freq: Every day | ORAL | Status: DC
  Administered 2021-08-07 – 2021-08-09 (×3): 81 mg via ORAL
  Filled 2021-08-07 (×3): qty 1

## 2021-08-07 NOTE — ED Notes (Signed)
Pt was given morning meds and had a lot of pain and discomfort while swallowing them, states they felt like they were getting stuck in her chest area. Pt also states even swallowing water was giving her discomfort. Provider notified.

## 2021-08-07 NOTE — Care Management CC44 (Signed)
Condition Code 44 Documentation Completed  Patient Details  Name: Averie Hornbaker MRN: [id number] Date of Birth: 1946-07-04   Condition Code 44 given:    Patient signature on Condition Code 44 notice:    Documentation of 2 MD's agreement:    Code 44 added to claim:       Laurena Slimmer, RN 08/07/2021, 7:23 PM

## 2021-08-07 NOTE — Evaluation (Signed)
Physical Therapy Evaluation Patient Details Name: Marissa [name] MRN: [id number] DOB: 02-17-1946 Today's Date: 08/07/2021  History of Present Illness  Pt is 75 yo female who presented with repeated falls, dizziness, nightmares, anxiety on 08/06/21.  Pt had recently started Namenda for cognitive impairments and then symptoms began. Pt also found to have acute/subacute R parietal CVA.  Namenda has been discontinued.  Pt with hx of mild cognitive impairment, asthma, anxiety/depression, HTN, HLD  Clinical Impression   Pt admitted with above diagnosis. At baseline, pt does have some unsteadiness but is usually ambulatory.  Over the past few weeks has had increased falls, anxiety, emotional labial, and started with new medication that has now been stopped.  Today, pt transferring with min guard to min A , used RW, and had mild unsteadiness but no overt LOB.  In regards to her dizziness - acute dizziness likely related to medication.  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  Orthostatic BP was negative.  Pt has support at home and spouse/pt were pleased with her ability to ambulate today.  They are in agreement with recommendation for HHPT. Pt currently with functional limitations due to the deficits listed below (see PT Problem List). Pt will benefit from skilled PT to increase their independence and safety with mobility to allow discharge to the venue listed below.    Orthostatic BPs  Supine 165/73  Sitting 167/89     Standing 155/80              Recommendations for follow up therapy are one component of a multi-disciplinary discharge planning process, led by the attending physician.  Recommendations may be updated based on patient status, additional functional criteria and insurance authorization.  Follow Up Recommendations Home health PT;Supervision for mobility/OOB (Family liked [facility])    Equipment Recommendations  None  recommended by PT    Recommendations for Other Services       Precautions / Restrictions Precautions Precautions: Fall      Mobility  Bed Mobility Overal bed mobility: Needs Assistance Bed Mobility: Rolling;Supine to Sit;Sit to Supine Rolling: Supervision   Supine to sit: Min assist Sit to supine: Min guard   General bed mobility comments: Light min A and increased time to sit    Transfers Overall transfer level: Needs assistance Equipment used: Rolling walker (2 wheeled) Transfers: Sit to/from Stand Sit to Stand: Min guard         General transfer comment: min guard for safety  Ambulation/Gait Ambulation/Gait assistance: Min guard Gait Distance (Feet): 180 Feet Assistive device: Rolling walker (2 wheeled) Gait Pattern/deviations: Step-through pattern;Decreased stride length Gait velocity: decreased   General Gait Details: Min guard for safety, mild unsteadiness, slow speed, cues for RW management  Stairs            Wheelchair Mobility    Modified [place] (Stroke Patients Only) Modified [place] (Stroke Patients Only) Pre-Morbid [place] Score: Moderate disability Modified [place]: Moderately severe disability     Balance Overall balance assessment: Needs assistance Sitting-balance support: Bilateral upper extremity supported;Feet unsupported Sitting balance-Leahy Scale: Poor Sitting balance - Comments: Requiring bil UE; was on ED stretcher with feet dangling - likely improved if feet could touch floor   Standing balance support: Bilateral upper extremity supported Standing balance-Leahy Scale: Poor Standing balance comment: requirign RW and min guard  Pertinent Vitals/Pain      Home Living Family/patient expects to be discharged to:: Private residence Living Arrangements: Spouse/significant other Available Help at Discharge: Family;Available 24 hours/day Type of Home: House Home Access: Stairs to  enter Entrance Stairs-Rails: Left Entrance Stairs-Number of Steps: 3 Home Layout: Two level;Able to live on main level with bedroom/bathroom Home Equipment: Shower seat - built in;Hand held Tourist information centre manager - 4 wheels;Cane - single point      Prior Function Level of Independence: Needs assistance   Gait / Transfers Assistance Needed: Occasionally used cane or rollator; reports some unsteadiness -bumping into doors; if going ambulating in community uses rollator  ADL's / Homemaking Assistance Needed: Reports decreased cooking b/c dropping things; performed ADLs on her own  Comments: Frequent falls and dizziness started about 4 weeks when she started a new meds for cognition     Hand Dominance        Extremity/Trunk Assessment   Upper Extremity Assessment Upper Extremity Assessment: LUE deficits/detail;RUE deficits/detail RUE Deficits / Details: ROM WFL, MMT grossly 4/5 throughout LUE Deficits / Details: ROM WFL; MMT shoulder and elbow 4/5, hand/wrist 3/5 due to soreness and burising from fall    Lower Extremity Assessment Lower Extremity Assessment: LLE deficits/detail;RLE deficits/detail RLE Deficits / Details: ROM WFL; MMT 4/5 LLE Deficits / Details: ROM WFL; MMT 4/5    Cervical / Trunk Assessment Cervical / Trunk Assessment: Normal;Other exceptions Cervical / Trunk Exceptions: 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      General Comments  Discussed PT recommendations for HHPT.  Pt had good experience with HH in past.  They also mentioned that they have outpt appts set-up for PT and SLP, but would rather do HHPT to start with. Discussed could progress to outpt PT  as pt seems to have chronic balance and dizziness on top of acute issues.   Dizziness Assessment:  History: Reports has worsened over past couple of weeks with new medication.  But does reports she has always had some dizziness and just described as unsteadiness -like when she walks through a doorway she runs into it.  States it is worse with fatigue. Denies with head turns or rolling.   Tracking: Pt unable to smoothly track with eyes, did not note nystagmus, no dizziness  Test of Skew: negative  Head-impulse test: unable to test, pt unable to relax neck for head turns  Head turns in sitting with gaze stabilization - intact no symptoms  Horizontal Roll Test: negative   Epley Maneuvar: unable to test due to neck pain/stiffness and in ED on stretcher.  Pt did not get dizzy with sit to supine and symptoms description does not seem consistent to BPPV.      Exercises     Assessment/Plan    PT Assessment Patient needs continued PT services  PT Problem List Decreased strength;Decreased mobility;Decreased safety awareness;Decreased range of motion;Decreased coordination;Decreased activity tolerance;Decreased cognition;Decreased balance;Decreased knowledge of use of DME;Impaired sensation       PT Treatment Interventions DME instruction;Therapeutic exercise;Gait training;Balance training;Stair training;Neuromuscular re-education;Functional mobility training;Therapeutic activities;Cognitive remediation;Patient/family education    PT Goals (  Current goals can be found in the Care Plan section)  Acute Rehab PT Goals Patient Stated Goal: return home PT Goal Formulation: With patient/family Time For Goal Achievement: 08/21/21 Potential to Achieve Goals: Good    Frequency Min 4X/week   Barriers to discharge        Co-evaluation               AM-PAC PT "6 Clicks" Mobility  Outcome Measure Help needed turning from your back to your side while in a flat bed without using bedrails?:  A Little Help needed moving from lying on your back to sitting on the side of a flat bed without using bedrails?: A Little Help needed moving to and from a bed to a chair (including a wheelchair)?: A Little Help needed standing up from a chair using your arms (e.g., wheelchair or bedside chair)?: A Little Help needed to walk in hospital room?: A Little Help needed climbing 3-5 steps with a railing? : A Little 6 Click Score: 18    End of Session Equipment Utilized During Treatment: Gait belt Activity Tolerance: Patient tolerated treatment well Patient left: in bed;with call bell/phone within reach;with family/visitor present (in ED - no alarm in room) Nurse Communication: Mobility status PT Visit Diagnosis: Other abnormalities of gait and mobility (R26.89);History of falling (Z91.81);Muscle weakness (generalized) (M62.81)    Time: 3383-2919 PT Time Calculation (min) (ACUTE ONLY): 36 min   Charges:   PT Evaluation $PT Eval Moderate Complexity: 1 Mod PT Treatments $Gait Training: 8-22 mins        Abran Richard, PT Acute Rehab Services Pager [phone] [facility] [place] 08/07/2021, 11:51 AM
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
## 2021-08-07 NOTE — Progress Notes (Signed)
2D echo attempted four times throughout the day. 1-Physical therapy was with patient, 2-patient was in procedure, 3-patient wanted to eat rather than echo, 4-and the fourth time, the patient was not in room.

## 2021-08-07 NOTE — Progress Notes (Signed)
PROGRESS NOTE  Marissa [name]    DOB: 07-05-1946, 75 y.o.  OVF:[id number]  PCP: [name], MD   Code Status: Full Code   DOA: 08/06/2021   LOS: 1  Brief Narrative of Current Hospitalization  Marissa [name] is a 75 y.o. female with a PMH significant for cognitive impairment, asthma, anxiety/depression, HTN, HLD. They presented from home to the ED on 08/06/2021 with dizziness and falls x several weeks. In the ED, it was found that they had new to subacute infarction of parietal lobe.  Neurology was consulted Patient was admitted to medicine service for further workup and management of dizziness as outlined in detail below.  08/07/21 -improved, stable  Assessment & Plan  Active Problems:   Frequent falls   Fall at home, initial encounter   Stroke (cerebrum) (HCC)  Dizziness, near syncope and fall-patient complains of left rib pain -Likely secondary to memantine side effect, discontinue memantine -Orthostatic vital signs, PT evaluation.  As needed Meclizine -Since April of this year, patient had tried Aricept, rivastigmine, and now memantine, and appears that she did not tolerate either of them because of the side effects. 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  Remains symptomatic of dizziness which has improved since discontinuing memantine -Continue aspirin, add plavix -Neurology following -Echo and carotid duplex pending -Continue Zetia, history of anaphylaxis to statin  Dysuria-patient endorses dysuria and suprapubic pain on exam.  UA overall normal with signs of possible dehydration. -Azo trial   Tachycardia- resolved.   Nausea and vomiting- continues - Qtc prolonged so limits  choice of agents -Trial of Ativan which will help with anxiety and nausea hopefully   COPD/asthma- Stable   Hypothyroidism-TSH pending   HTN-elevated -Continue beta-blocker, amlodipine   Chronic rib fracture and pain- -Lidocaine patch.  Anxiety/depression- -Continue fluoxetine -Ativan added  DVT prophylaxis: enoxaparin (LOVENOX) injection 40 mg Start: 08/06/21 1900   Diet:  Diet Orders (From admission, onward)     Start     Ordered   08/06/21 1804  Diet Heart Room service appropriate? Yes; Fluid consistency: Thin  Diet effective now       Question Answer Comment  Room service appropriate? Yes   Fluid consistency: Thin      08/06/21 1805            Subjective 08/07/21    Pt reports nausea, vomiting and dizziness with activities which has improved.  Disposition Plan & Communication  Status is: Inpatient  Remains inpatient appropriate because:Ongoing diagnostic testing needed not appropriate for outpatient work up  Dispo: The patient is from: Home              Anticipated d/c is to: Home              Patient currently is not medically stable to d/c.     Family Communication: Husband at bedside  Consults, Procedures, Significant Events  Consultants:  Neurology  Procedures/significant events:  None  Antimicrobials:  Anti-infectives (From admission, onward)    None        Objective   Vitals:   08/07/21 0300 08/07/21 0400 08/07/21 0600 08/07/21 0700  BP: 134/67 (!) 141/78 (!) 151/67 (!) 160/84  Pulse:  (!) 55 (!) 56 (!) 58  Resp: 16 16 17  (!)  21  Temp:      TempSrc:      SpO2:  100% 97% 100%  Weight:      Height:        Intake/Output Summary (Last 24 hours) at 08/07/2021 0743 Last data filed at 08/06/2021 2157 Gross per 24 hour  Intake 1050 ml  Output --  Net 1050 ml   Filed Weights   08/06/21 1242  Weight: 59.9 kg    Patient BMI: Body mass index is 22.31 kg/m.   Physical Exam: General: awake, alert, NAD HEENT: atraumatic, clear  conjunctiva, anicteric sclera, moist mucus membranes, hearing grossly normal -Thinning hair Respiratory: CTAB, no wheezes, rales or rhonchi, normal respiratory effort. Cardiovascular: normal S1/S2,  RRR, no JVD, murmurs, rubs, gallops, quick capillary refill  Gastrointestinal: soft, NT, ND, no HSM felt Nervous: A&O x3. no gross focal neurologic deficits, normal speech Extremities: moves all equally, no edema, normal tone Skin: dry, intact, normal temperature, normal color, No rashes, lesions or ulcers Psychiatry: Anxious mood, pressured speech  Labs   I have personally reviewed following labs and imaging studies Admission on 08/06/2021  Component Date Value Ref Range Status   Sodium 08/06/2021 129 (A) 135 - 145 mmol/L Final   Potassium 08/06/2021 4.1  3.5 - 5.1 mmol/L Final   Chloride 08/06/2021 90 (A) 98 - 111 mmol/L Final   CO2 08/06/2021 18 (A) 22 - 32 mmol/L Final   Glucose, Bld 08/06/2021 82  70 - 99 mg/dL Final   BUN 08/06/2021 12  8 - 23 mg/dL Final   Creatinine, Ser 08/06/2021 0.92  0.44 - 1.00 mg/dL Final   Calcium 08/06/2021 10.1  8.9 - 10.3 mg/dL Final   Total Protein 08/06/2021 7.1  6.5 - 8.1 g/dL Final   Albumin 08/06/2021 4.3  3.5 - 5.0 g/dL Final   AST 08/06/2021 36  15 - 41 U/L Final   ALT 08/06/2021 22  0 - 44 U/L Final   Alkaline Phosphatase 08/06/2021 45  38 - 126 U/L Final   Total Bilirubin 08/06/2021 1.3 (A) 0.3 - 1.2 mg/dL Final   GFR, Estimated 08/06/2021 >60  >60 mL/min Final   Anion gap 08/06/2021 21 (A) 5 - 15 Final   WBC 08/06/2021 9.4  4.0 - 10.5 K/uL Final   RBC 08/06/2021 4.24  3.87 - 5.11 MIL/uL Final   Hemoglobin 08/06/2021 13.1  12.0 - 15.0 g/dL Final   HCT 08/06/2021 36.4  36.0 - 46.0 % Final   MCV 08/06/2021 85.8  80.0 - 100.0 fL Final   MCH 08/06/2021 30.9  26.0 - 34.0 pg Final   MCHC 08/06/2021 36.0  30.0 - 36.0 g/dL Final   RDW 08/06/2021 14.0  11.5 - 15.5 % Final   Platelets 08/06/2021 219  150 - 400 K/uL Final   nRBC 08/06/2021 0.0  0.0 -  0.2 % Final   Neutrophils Relative % 08/06/2021 86  % Final   Neutro Abs 08/06/2021 8.0 (A) 1.7 - 7.7 K/uL Final   Lymphocytes Relative 08/06/2021 6  % Final   Lymphs Abs 08/06/2021 0.6 (A) 0.7 - 4.0 K/uL Final   Monocytes Relative 08/06/2021 8  % Final   Monocytes Absolute 08/06/2021 0.7  0.1 - 1.0 K/uL Final   Eosinophils Relative 08/06/2021 0  % Final   Eosinophils Absolute 08/06/2021 0.0  0.0 - 0.5 K/uL Final   Basophils Relative 08/06/2021 0  % Final   Basophils Absolute 08/06/2021 0.0  0.0 - 0.1 K/uL Final   Immature Granulocytes 08/06/2021  0  % Final   Abs Immature Granulocytes 08/06/2021 0.03  0.00 - 0.07 K/uL Final   Troponin I (High Sensitivity) 08/06/2021 9  <18 ng/L Final   Lactic Acid, Venous 08/06/2021 2.2 (A) 0.5 - 1.9 mmol/L Final   Lactic Acid, Venous 08/06/2021 1.7  0.5 - 1.9 mmol/L Final   Lipase 08/06/2021 28  11 - 51 U/L Final   Color, Urine 08/07/2021 YELLOW  YELLOW Final   APPearance 08/07/2021 CLEAR  CLEAR Final   Specific Gravity, Urine 08/07/2021 1.009  1.005 - 1.030 Final   pH 08/07/2021 5.0  5.0 - 8.0 Final   Glucose, UA 08/07/2021 NEGATIVE  NEGATIVE mg/dL Final   Hgb urine dipstick 08/07/2021 NEGATIVE  NEGATIVE Final   Bilirubin Urine 08/07/2021 NEGATIVE  NEGATIVE Final   Ketones, ur 08/07/2021 80 (A) NEGATIVE mg/dL Final   Protein, ur 08/07/2021 NEGATIVE  NEGATIVE mg/dL Final   Nitrite 08/07/2021 NEGATIVE  NEGATIVE Final   Leukocytes,Ua 08/07/2021 NEGATIVE  NEGATIVE Final   Opiates 08/07/2021 NONE DETECTED  NONE DETECTED Final   Cocaine 08/07/2021 NONE DETECTED  NONE DETECTED Final   Benzodiazepines 08/07/2021 POSITIVE (A) NONE DETECTED Final   Amphetamines 08/07/2021 NONE DETECTED  NONE DETECTED Final   Tetrahydrocannabinol 08/07/2021 NONE DETECTED  NONE DETECTED Final   Barbiturates 08/07/2021 NONE DETECTED  NONE DETECTED Final   Alcohol, Ethyl (B) 08/06/2021 <10  <10 mg/dL Final   Salicylate Lvl 42/70/6237 <7.0 (A) 7.0 - 30.0 mg/dL Final    Acetaminophen (Tylenol), Serum 08/06/2021 <10 (A) 10 - 30 ug/mL Final   SARS Coronavirus 2 by RT PCR 08/06/2021 NEGATIVE  NEGATIVE Final   Influenza A by PCR 08/06/2021 NEGATIVE  NEGATIVE Final   Influenza B by PCR 08/06/2021 NEGATIVE  NEGATIVE Final   Troponin I (High Sensitivity) 08/06/2021 10  <18 ng/L Final   Magnesium 08/06/2021 1.2 (A) 1.7 - 2.4 mg/dL Final   Ammonia 08/06/2021 12  9 - 35 umol/L Final   Hgb A1c MFr Bld 08/07/2021 5.1  4.8 - 5.6 % Final   Mean Plasma Glucose 08/07/2021 99.67  mg/dL Final   Magnesium 08/07/2021 1.7  1.7 - 2.4 mg/dL Final   Sodium 08/07/2021 131 (A) 135 - 145 mmol/L Final   Potassium 08/07/2021 3.2 (A) 3.5 - 5.1 mmol/L Final   Chloride 08/07/2021 97 (A) 98 - 111 mmol/L Final   CO2 08/07/2021 19 (A) 22 - 32 mmol/L Final   Glucose, Bld 08/07/2021 84  70 - 99 mg/dL Final   BUN 08/07/2021 10  8 - 23 mg/dL Final   Creatinine, Ser 08/07/2021 0.84  0.44 - 1.00 mg/dL Final   Calcium 08/07/2021 9.0  8.9 - 10.3 mg/dL Final   GFR, Estimated 08/07/2021 >60  >60 mL/min Final   Anion gap 08/07/2021 15  5 - 15 Final   pH, Ven 08/06/2021 7.539 (A) 7.250 - 7.430 Final   pCO2, Ven 08/06/2021 22.9 (A) 44.0 - 60.0 mmHg Final   pO2, Ven 08/06/2021 188.0 (A) 32.0 - 45.0 mmHg Final   Bicarbonate 08/06/2021 19.5 (A) 20.0 - 28.0 mmol/L Final   TCO2 08/06/2021 20 (A) 22 - 32 mmol/L Final   O2 Saturation 08/06/2021 100.0  % Final   Acid-base deficit 08/06/2021 2.0  0.0 - 2.0 mmol/L Final   Sodium 08/06/2021 124 (A) 135 - 145 mmol/L Final   Potassium 08/06/2021 3.3 (A) 3.5 - 5.1 mmol/L Final   Calcium, Ion 08/06/2021 1.07 (A) 1.15 - 1.40 mmol/L Final   HCT 08/06/2021 35.0 (A) 36.0 -  46.0 % Final   Hemoglobin 08/06/2021 11.9 (A) 12.0 - 15.0 g/dL Final   Sample type 08/06/2021 VENOUS   Final   Cholesterol 08/07/2021 250 (A) 0 - 200 mg/dL Final   Triglycerides 08/07/2021 150 (A) <150 mg/dL Final   HDL 08/07/2021 111  >40 mg/dL Final   Total CHOL/HDL Ratio 08/07/2021 2.3   RATIO Final   VLDL 08/07/2021 30  0 - 40 mg/dL Final   LDL Cholesterol 08/07/2021 109 (A) 0 - 99 mg/dL Final    Imaging Studies  DG Ribs Unilateral W/Chest Left  Result Date: 08/06/2021 CLINICAL DATA:  Left wrist pain after falling. Weakness with syncopal episode. EXAM: LEFT RIBS AND CHEST - 3+ VIEW COMPARISON:  Chest radiographs 08/03/2021.  CT 01/30/2010. FINDINGS: The heart size and mediastinal contours are stable. The lungs are clear. There is no pleural effusion or pneumothorax. There are multiple old left-sided rib fractures, involving the left 7th through 10th ribs posteriorly. No acute rib fractures are identified. There is a mildly displaced and comminuted fracture of the distal right clavicle which appears new from 05/29/2021. IMPRESSION: 1. No evidence of acute rib fracture, pleural effusion or pneumothorax. 2. Multiple left-sided rib fractures are unchanged. A fracture of the distal right clavicle is unchanged from recent prior study, although appears new from 05/29/2021. Electronically Signed   By: Richardean Sale M.D.   On: 08/06/2021 14:47   DG Wrist Complete Left  Result Date: 08/06/2021 CLINICAL DATA:  Fall.  Left wrist pain. EXAM: LEFT WRIST - COMPLETE 3+ VIEW COMPARISON:  Hand radiographs 10/03/2010. FINDINGS: The mineralization and alignment are normal. There is no evidence of acute fracture or dislocation. Moderately advanced degenerative changes are again noted at the 1st carpometacarpal articulation. The additional joint spaces are preserved. There is possible dorsal soft tissue swelling without evidence of foreign body. IMPRESSION: No evidence of acute fracture or dislocation. Possible dorsal soft tissue swelling with chronic degenerative changes at the base of the thumb. Electronically Signed   By: Richardean Sale M.D.   On: 08/06/2021 14:43   CT HEAD WO CONTRAST (5MM)  Result Date: 08/06/2021 CLINICAL DATA:  Head trauma. Weakness. Nausea and vomiting. Syncopal episode.  EXAM: CT HEAD WITHOUT CONTRAST CT CERVICAL SPINE WITHOUT CONTRAST TECHNIQUE: Multidetector CT imaging of the head and cervical spine was performed following the standard protocol without intravenous contrast. Multiplanar CT image reconstructions of the cervical spine were also generated. COMPARISON:  10/22/2019.  Brain MRI 12/08/2020. FINDINGS: CT HEAD FINDINGS Brain: Age related volume loss. Chronic small-vessel ischemic changes affect the cerebral hemispheric white matter. There is a small right parietal cortical infarction which could be old or subacute. This was not present on an MRI study of February from this year. No evidence of mass, hemorrhage, hydrocephalus or extra-axial collection. Vascular: There is atherosclerotic calcification of the major vessels at the base of the brain. Skull: No skull fracture. Sinuses/Orbits: No fluid in the sinuses.  Orbits are negative. Other: None CT CERVICAL SPINE FINDINGS Alignment: No traumatic malalignment. Straightening of the normal cervical lordosis. 2 mm degenerative anterolisthesis C3-4. Skull base and vertebrae: No regional fracture. Soft tissues and spinal canal: No traumatic soft tissue finding. Ordinary atherosclerotic calcification of the carotid siphons. Disc levels: Facet arthropathy on the right at C3-4, C4-5 and C7-T1 and on the left at C2-3 and C7-T1. Degenerative spondylosis with disc space narrowing and bulging of the discs at C3-4, C4-5, C5-6 and C6-7. Foraminal narrowing due to bony encroachment particularly notable on the right at  C3-4 and C4-5 and on the left at C2-3, C5-6 and C6-7. Upper chest: Negative Other: None IMPRESSION: Head CT: No traumatic finding. Chronic small-vessel ischemic changes of the white matter. Small right parietal stroke that could be subacute or old, but was not present at the time of MRI done in February of this year. Cervical spine CT: No acute or traumatic finding. Chronic degenerative spondylosis and facet arthropathy as  above, not significantly changed since December of 2020. Electronically Signed   By: Nelson Chimes M.D.   On: 08/06/2021 16:54   CT Cervical Spine Wo Contrast  Result Date: 08/06/2021 CLINICAL DATA:  Head trauma. Weakness. Nausea and vomiting. Syncopal episode. EXAM: CT HEAD WITHOUT CONTRAST CT CERVICAL SPINE WITHOUT CONTRAST TECHNIQUE: Multidetector CT imaging of the head and cervical spine was performed following the standard protocol without intravenous contrast. Multiplanar CT image reconstructions of the cervical spine were also generated. COMPARISON:  10/22/2019.  Brain MRI 12/08/2020. FINDINGS: CT HEAD FINDINGS Brain: Age related volume loss. Chronic small-vessel ischemic changes affect the cerebral hemispheric white matter. There is a small right parietal cortical infarction which could be old or subacute. This was not present on an MRI study of February from this year. No evidence of mass, hemorrhage, hydrocephalus or extra-axial collection. Vascular: There is atherosclerotic calcification of the major vessels at the base of the brain. Skull: No skull fracture. Sinuses/Orbits: No fluid in the sinuses.  Orbits are negative. Other: None CT CERVICAL SPINE FINDINGS Alignment: No traumatic malalignment. Straightening of the normal cervical lordosis. 2 mm degenerative anterolisthesis C3-4. Skull base and vertebrae: No regional fracture. Soft tissues and spinal canal: No traumatic soft tissue finding. Ordinary atherosclerotic calcification of the carotid siphons. Disc levels: Facet arthropathy on the right at C3-4, C4-5 and C7-T1 and on the left at C2-3 and C7-T1. Degenerative spondylosis with disc space narrowing and bulging of the discs at C3-4, C4-5, C5-6 and C6-7. Foraminal narrowing due to bony encroachment particularly notable on the right at C3-4 and C4-5 and on the left at C2-3, C5-6 and C6-7. Upper chest: Negative Other: None IMPRESSION: Head CT: No traumatic finding. Chronic small-vessel ischemic  changes of the white matter. Small right parietal stroke that could be subacute or old, but was not present at the time of MRI done in February of this year. Cervical spine CT: No acute or traumatic finding. Chronic degenerative spondylosis and facet arthropathy as above, not significantly changed since December of 2020. Electronically Signed   By: Nelson Chimes M.D.   On: 08/06/2021 16:54   MR ANGIO HEAD WO CONTRAST  Result Date: 08/06/2021 CLINICAL DATA:  Stroke, follow-up. Altered mental status. Multiple falls. Syncopal event. EXAM: MRI HEAD WITHOUT CONTRAST MRA HEAD WITHOUT CONTRAST TECHNIQUE: Multiplanar, multi-echo pulse sequences of the brain and surrounding structures were acquired without intravenous contrast. Angiographic images of the Circle of Willis were acquired using MRA technique without intravenous contrast. COMPARISON:  Head CT earlier same day.  MRI 12/08/2020. FINDINGS: MRI HEAD FINDINGS Brain: Diffusion imaging shows a small acute to subacute cortical and subcortical infarction in the right parietal lobe, at the site of the CT abnormality. No evidence of mass effect or hemorrhage. Elsewhere, the brainstem and cerebellum are normal. There are mild to moderate chronic small-vessel ischemic changes of the cerebral hemispheric white matter no mass lesion, hemorrhage, hydrocephalus or extra-axial collection. Vascular: Major vessels at the base of the brain show flow. Skull and upper cervical spine: Negative Sinuses/Orbits: Clear/normal Other: None MRA HEAD FINDINGS Anterior circulation:  Both internal carotid arteries are patent through the skull base and siphon regions. The anterior and middle cerebral vessels are patent without evidence of large vessel occlusion or correctable proximal stenosis. There is mild distal vessel atherosclerotic irregularity. Posterior circulation: Both vertebral arteries are widely patent to the basilar. No basilar stenosis. Posterior circulation branch vessels are  patent. Mild atherosclerotic irregularity of the PCA branch vessels. Anatomic variants: None significant. 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 Moderate chronic small-vessel ischemic changes elsewhere. Intracranial MR angiography is negative for large vessel occlusion or correctable proximal stenosis. Mild atherosclerotic irregularity of the more distal intracranial branch vessels. Electronically Signed   By: Nelson Chimes M.D.   On: 08/06/2021 18:58   MR BRAIN WO CONTRAST  Result Date: 08/06/2021 CLINICAL DATA:  Stroke, follow-up. Altered mental status. Multiple falls. Syncopal event. EXAM: MRI HEAD WITHOUT CONTRAST MRA HEAD WITHOUT CONTRAST TECHNIQUE: Multiplanar, multi-echo pulse sequences of the brain and surrounding structures were acquired without intravenous contrast. Angiographic images of the Circle of Willis were acquired using MRA technique without intravenous contrast. COMPARISON:  Head CT earlier same day.  MRI 12/08/2020. FINDINGS: MRI HEAD FINDINGS Brain: Diffusion imaging shows a small acute to subacute cortical and subcortical infarction in the right parietal lobe, at the site of the CT abnormality. No evidence of mass effect or hemorrhage. Elsewhere, the brainstem and cerebellum are normal. There are mild to moderate chronic small-vessel ischemic changes of the cerebral hemispheric white matter no mass lesion, hemorrhage, hydrocephalus or extra-axial collection. Vascular: Major vessels at the base of the brain show flow. Skull and upper cervical spine: Negative Sinuses/Orbits: Clear/normal Other: None MRA HEAD FINDINGS Anterior circulation: Both internal carotid arteries are patent through the skull base and siphon regions. The anterior and middle cerebral vessels are patent without evidence of large vessel occlusion or correctable proximal stenosis. There is mild distal vessel atherosclerotic  irregularity. Posterior circulation: Both vertebral arteries are widely patent to the basilar. No basilar stenosis. Posterior circulation branch vessels are patent. Mild atherosclerotic irregularity of the PCA branch vessels. Anatomic variants: None significant. 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 Moderate chronic small-vessel ischemic changes elsewhere. Intracranial MR angiography is negative for large vessel occlusion or correctable proximal stenosis. Mild atherosclerotic irregularity of the more distal intracranial branch vessels. Electronically Signed   By: Nelson Chimes M.D.   On: 08/06/2021 18:58   DG Abd 2 Views  Result Date: 08/06/2021 CLINICAL DATA:  Nausea and vomiting. Altered mental status and frequent falls EXAM: ABDOMEN - 2 VIEW COMPARISON:  None. FINDINGS: Scattered gas and stool throughout the colon. No small or large bowel distention. No free intra-abdominal air. No abnormal air-fluid levels. No radiopaque stones. Degenerative changes in the spine and hips. Lumbar scoliosis convex towards the right. Lung bases are clear. Old left rib fractures. IMPRESSION: Normal nonobstructive bowel gas pattern. Electronically Signed   By: Lucienne Capers M.D.   On: 08/06/2021 19:58   Medications   Scheduled Meds:  amLODipine  10 mg Oral Daily   Bempedoic Acid  180 mg Oral Daily   clopidogrel  75 mg Oral Daily   enoxaparin (LOVENOX) injection  40 mg Subcutaneous Q24H   ezetimibe  10 mg Oral Daily   FLUoxetine  40 mg Oral BID   levothyroxine  125 mcg Oral QAC breakfast   lidocaine  1 patch Transdermal Q24H  mirabegron ER  50 mg Oral Daily   montelukast  10 mg Oral QHS   nebivolol  10 mg Oral Daily   potassium & sodium phosphates  1 packet Oral TID WC & HS     LOS: 1 day   Time spent: >50mn  CRicharda Osmond DO [facility] 08/07/2021, 7:43 AM   To contact the [facility] InstituteAttending  or Consulting provider for this patient: Check the care team in [facility] a) attending/consulting TNorth Grosven[facility] Daleprovider listed and b) the TSpring[facility] Hospitalteam listed Log into [email or web address] and use [place]'s universal password to access. If you do not have the password, please contact the hospital operator. Locate the TV[facility] Care Systemprovider you are looking for under [facility] and page to a number that you can be directly reached. If you still have difficulty reaching the provider, please page the [facility](Director on Call) for the Hospitalists listed on amion for assistance.

## 2021-08-07 NOTE — Care Management Obs Status (Signed)
[place] NOTIFICATION   Patient Details  Name: Marissa [name] MRN: [id number] Date of Birth: [birth year]   Medicare Observation Status Notification Given:       Laurena Slimmer, RN 08/07/2021, 7:23 PM

## 2021-08-07 NOTE — ED Notes (Signed)
ED TO INPATIENT HANDOFF REPORT  ED Nurse Name and Phone #: Baxter Flattery, RN [phone]  S Name/Age/Gender Marissa [name] 75 y.o. female Room/Bed: 037C/037C  Code Status   Code Status: Full Code  Home/SNF/Other Home Patient oriented to: self, place, time, and situation Is this baseline? 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 One witnessed syncope. Multiple falls last few days, unknown how many were syncope vs mechanical. Last fall today was unwitnessed, husband found him on the floor. Not on thinner. Unsure if she hit her head. Pt c/o left rib cage pain and left wrist pain. Pt has mild cognitive delay.    Allergies Allergies  Allergen Reactions   Fentanyl Shortness Of Breath and Rash   Rosuvastatin Anaphylaxis, Anxiety, Hives, Itching, Nausea And Vomiting, Other (See Comments), Palpitations, Photosensitivity and Shortness Of Breath   Amoxicillin Diarrhea    Severe diarrhea, rash to vaginal area with swelling    Hctz [Hydrochlorothiazide] Other (See Comments)    HypoNatremia   Hydralazine     Weak, sweats, red skin   Codeine Other (See Comments)    makes her hyper   Conjugated Estrogens Itching and Rash   Erythromycin Rash    had a rash with emycin, has done ok with other meds in it's class   Piroxicam Itching and Rash    Feldene    Level of Care/Admitting Diagnosis ED Disposition     ED Disposition  Admit   Condition  --   [place]: [facility] [[id number]]  Level of Care: Telemetry Medical [104]  May admit patient to [facility] or [facility] if equivalent level of care is available:: No  Covid Evaluation: Asymptomatic Screening Protocol (No Symptoms)  Diagnosis: Stroke (cerebrum) [facility]) [[id number]]  Admitting Physician: Lequita Halt [[id number]]  Attending Physician: Lequita Halt [[id number]]  Estimated length of stay: past midnight tomorrow   Certification:: I certify this patient will need inpatient services for at least 2 midnights          B Medical/Surgery History Past Medical History:  Diagnosis Date   Arthritis    Asthma    Bicuspid aortic valve 05/2017   Likely functional bicuspid aortic valve with sclerosis and no stenosis.   Bursitis    Cataract    mild   Dysthymia    Hemorrhoids    Hx of ulcerative colitis    per dr Arnoldo Morale as per pt.   Hyperlipidemia    Hypertension    managed - labile.   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 Roxan Hockey)   Thyroid disease    Past Surgical History:  Procedure Laterality Date   ABDOMINAL HYSTERECTOMY     BREAST EXCISIONAL BIOPSY Left    BUNIONECTOMY     Cardiac Event Monitor  07/2017   Overall relatively normal.  Normal sinus rhythm with rare bradycardia and tachycardia.  Heart rate ranged from 55-110 bpm.  Occasional PACs and PVCs, every single 1 was felt.  No arrhythmias other than one short 4 beat run of PACs.   COLONOSCOPY  02-04-2005   all  normal    FOOT SURGERY     3 pins in toes    HAND SURGERY     left thumb joint resection   nasal revision     NM MYOVIEW LTD  07/2017   LOW RISK study.  No ischemia or infarction.  EF greater than 65%.    SINUS IRRIGATION     TONSILLECTOMY AND ADENOIDECTOMY     TRANSTHORACIC ECHOCARDIOGRAM  06/'1, 8/'19   a) Moderate LVH. Normal EF 60-65%. Normal diastolic parameters. --> Difficult to fully visualize the aortic valve. Cannot exclude bicuspid valve. Mild aortic stenosis noted. No PFO. Mildly dilated left atrium. Trivial MR. No comment on mitral valve prolapse. Moderately dilated ascending aorta.;; b) F/u Echo To evaluate the aortic valve. -- Bicuspid AoV - mildly thickened / calcified. - No  stenosis   TRANSTHORACIC ECHOCARDIOGRAM  11/2018   Normal LV size and function.  EF 60-65%.  GR 1 DD.  Normal RV size and function.  Moderate aortic valve calcification with mild Aortic Stenosis.  Normal LV size and function.  EF 60-65%.  GR 1 DD.  Normal RV size and function.  Moderate aortic calcification with mild stenosis.  Mild dilation of ascending aorta ~4.2 mm   TRANSTHORACIC ECHOCARDIOGRAM  08/15/2020   EF 60 to 65%.  No R WMA.  GRII DD.  Mild Aortic Stenosis (mean gradient 10 mmHg).  Moderate to severe aortic root dilation (45 mm) normal IVC.  (Stable compared to February 2020)      A IV Location/Drains/Wounds Patient Lines/Drains/Airways Status     Active Line/Drains/Airways     Name Placement date Placement time Site Days   Peripheral IV 12/23/18 Anterior;Left Forearm 12/23/18  0835  Forearm  958   Peripheral IV 08/06/21 20 G Distal;Left;Posterior Forearm 08/06/21  1530  Forearm  1            Intake/Output Last 24 hours  Intake/Output Summary (Last 24 hours) at 08/07/2021 1307 Last data filed at 08/06/2021 2157 Gross per 24 hour  Intake 1050 ml  Output --  Net 1050 ml    Labs/Imaging Results for orders placed or performed during the hospital encounter of 08/06/21 (from the past 48 hour(s))  Blood culture (routine x 2)     Status: None (Preliminary result)   Collection Time: 08/06/21  2:39 PM   Specimen: BLOOD  Result Value Ref Range   Specimen Description BLOOD SITE NOT SPECIFIED    Special Requests      BOTTLES DRAWN AEROBIC AND ANAEROBIC Blood Culture results may not be optimal due to an inadequate volume of blood received in culture bottles   Culture      NO GROWTH < 12 HOURS Performed at [facility] [address], [place], [place] [address]    Report Status PENDING   Comprehensive metabolic panel     Status: Abnormal   Collection Time: 08/06/21  2:55 PM  Result Value Ref Range   Sodium 129 (L) 135 - 145 mmol/L   Potassium 4.1 3.5 - 5.1  mmol/L   Chloride 90 (L) 98 - 111 mmol/L   CO2 18 (L) 22 - 32 mmol/L   Glucose, Bld 82 70 - 99 mg/dL    Comment: Glucose reference range applies only to samples taken after fasting for at least 8 hours.   BUN 12 8 - 23 mg/dL   Creatinine, Ser 0.92 0.44 - 1.00 mg/dL   Calcium 10.1 8.9 - 10.3 mg/dL   Total Protein 7.1  6.5 - 8.1 g/dL   Albumin 4.3 3.5 - 5.0 g/dL   AST 36 15 - 41 U/L   ALT 22 0 - 44 U/L   Alkaline Phosphatase 45 38 - 126 U/L   Total Bilirubin 1.3 (H) 0.3 - 1.2 mg/dL   GFR, Estimated >60 >60 mL/min    Comment: (NOTE) Calculated using the CKD-EPI Creatinine Equation (2021)    Anion gap 21 (H) 5 - 15    Comment: RESULT REPEATED AND VERIFIED Performed at [facility] [address], [place], [place] [address]   CBC with Differential     Status: Abnormal   Collection Time: 08/06/21  2:55 PM  Result Value Ref Range   WBC 9.4 4.0 - 10.5 K/uL   RBC 4.24 3.87 - 5.11 MIL/uL   Hemoglobin 13.1 12.0 - 15.0 g/dL   HCT 36.4 36.0 - 46.0 %   MCV 85.8 80.0 - 100.0 fL   MCH 30.9 26.0 - 34.0 pg   MCHC 36.0 30.0 - 36.0 g/dL   RDW 14.0 11.5 - 15.5 %   Platelets 219 150 - 400 K/uL   nRBC 0.0 0.0 - 0.2 %   Neutrophils Relative % 86 %   Neutro Abs 8.0 (H) 1.7 - 7.7 K/uL   Lymphocytes Relative 6 %   Lymphs Abs 0.6 (L) 0.7 - 4.0 K/uL   Monocytes Relative 8 %   Monocytes Absolute 0.7 0.1 - 1.0 K/uL   Eosinophils Relative 0 %   Eosinophils Absolute 0.0 0.0 - 0.5 K/uL   Basophils Relative 0 %   Basophils Absolute 0.0 0.0 - 0.1 K/uL   Immature Granulocytes 0 %   Abs Immature Granulocytes 0.03 0.00 - 0.07 K/uL    Comment: Performed at [facility] [address], [place], [place] [address]  Troponin I (High Sensitivity)     Status: None   Collection Time: 08/06/21  2:55 PM  Result Value Ref Range   Troponin I (High Sensitivity) 9 <18 ng/L    Comment: (NOTE) Elevated high sensitivity troponin I (hsTnI) values and significant  changes across serial measurements may  suggest ACS but many other  chronic and acute conditions are known to elevate hsTnI results.  Refer to the Links section for chest pain algorithms and additional  guidance. Performed at [facility], [place] [address], [place], [place] [address]   Lactic acid, plasma     Status: Abnormal   Collection Time: 08/06/21  2:55 PM  Result Value Ref Range   Lactic Acid, Venous 2.2 (HH) 0.5 - 1.9 mmol/L    Comment: CRITICAL RESULT CALLED TO, READ BACK BY AND VERIFIED WITH: C CHAPMAN RN BY SSTEPHENS [id number] Performed at [facility], [place] [address] [place], [place] [address]   Lipase, blood     Status: None   Collection Time: 08/06/21  2:55 PM  Result Value Ref Range   Lipase 28 11 - 51 U/L    Comment: Performed at [facility], [place] [address] [place], [place] [address]  Ethanol     Status: None   Collection Time: 08/06/21  2:55 PM  Result Value Ref Range   Alcohol, Ethyl (B) <10 <10 mg/dL    Comment: (NOTE) Lowest detectable limit for serum alcohol is 10 mg/dL.  For medical purposes only. Performed at [facility], [place] [address] [place], [place] [address]   Salicylate level     Status: Abnormal   Collection  Time: 08/06/21  2:55 PM  Result Value Ref Range   Salicylate Lvl <6.2 (L) 7.0 - 30.0 mg/dL    Comment: Performed at [facility] [address], [place], [place] [address]  Acetaminophen level     Status: Abnormal   Collection Time: 08/06/21  2:55 PM  Result Value Ref Range   Acetaminophen (Tylenol), Serum <10 (L) 10 - 30 ug/mL    Comment: (NOTE) Therapeutic concentrations vary significantly. A range of 10-30 ug/mL  may be an effective concentration for many patients. However, some  are best treated at concentrations outside of this range. Acetaminophen concentrations >150 ug/mL at 4 hours after ingestion  and >50 ug/mL at 12 hours after ingestion are often associated with  toxic reactions.  Performed at [facility], [place]  [address], [place], [place] [address]   Resp Panel by RT-PCR (Flu A&B, Covid) Nasopharyngeal Swab     Status: None   Collection Time: 08/06/21  3:43 PM   Specimen: Nasopharyngeal Swab; Nasopharyngeal(NP) swabs in vial transport medium  Result Value Ref Range   SARS Coronavirus 2 by RT PCR NEGATIVE NEGATIVE    Comment: (NOTE) SARS-CoV-2 target nucleic acids are NOT DETECTED.  The SARS-CoV-2 RNA is generally detectable in upper respiratory specimens during the acute phase of infection. The lowest concentration of SARS-CoV-2 viral copies this assay can detect is 138 copies/mL. A negative result does not preclude SARS-Cov-2 infection and should not be used as the sole basis for treatment or other patient management decisions. A negative result may occur with  improper specimen collection/handling, submission of specimen other than nasopharyngeal swab, presence of viral mutation(s) within the areas targeted by this assay, and inadequate number of viral copies(<138 copies/mL). A negative result must be combined with clinical observations, patient history, and epidemiological information. The expected result is Negative.  Fact Sheet for Patients:  EntrepreneurPulse.com.au  Fact Sheet for Healthcare Providers:  IncredibleEmployment.be  This test is no t yet approved or cleared by the [place] FDA and  has been authorized for detection and/or diagnosis of SARS-CoV-2 by FDA under an Emergency Use Authorization (EUA). This EUA will remain  in effect (meaning this test can be used) for the duration of the COVID-19 declaration under Section 564(b)(1) of the Act, 21 U.S.C.section 360bbb-3(b)(1), unless the authorization is terminated  or revoked sooner.       Influenza A by PCR NEGATIVE NEGATIVE   Influenza B by PCR NEGATIVE NEGATIVE    Comment: (NOTE) The Xpert Xpress SARS-CoV-2/FLU/RSV plus assay is intended as an aid in the diagnosis of influenza from  Nasopharyngeal swab specimens and should not be used as a sole basis for treatment. Nasal washings and aspirates are unacceptable for Xpert Xpress SARS-CoV-2/FLU/RSV testing.  Fact Sheet for Patients: EntrepreneurPulse.com.au  Fact Sheet for Healthcare Providers: IncredibleEmployment.be  This test is not yet approved or cleared by the [place] FDA and has been authorized for detection and/or diagnosis of SARS-CoV-2 by FDA under an Emergency Use Authorization (EUA). This EUA will remain in effect (meaning this test can be used) for the duration of the COVID-19 declaration under Section 564(b)(1) of the Act, 21 U.S.C. section 360bbb-3(b)(1), unless the authorization is terminated or revoked.  Performed at [facility], [place] [address], [place], [place] [address]   Lactic acid, plasma     Status: None   Collection Time: 08/06/21  4:53 PM  Result Value Ref Range   Lactic Acid, Venous 1.7 0.5 - 1.9  mmol/L    Comment: Performed at [facility], [place] [address], [place], [place] [address]  Troponin I (High Sensitivity)     Status: None   Collection Time: 08/06/21  4:53 PM  Result Value Ref Range   Troponin I (High Sensitivity) 10 <18 ng/L    Comment: (NOTE) Elevated high sensitivity troponin I (hsTnI) values and significant  changes across serial measurements may suggest ACS but many other  chronic and acute conditions are known to elevate hsTnI results.  Refer to the "Links" section for chest pain algorithms and additional  guidance. Performed at [facility], [place] [address], [place], [place] [address]   Magnesium     Status: Abnormal   Collection Time: 08/06/21  4:53 PM  Result Value Ref Range   Magnesium 1.2 (L) 1.7 - 2.4 mg/dL    Comment: Performed at [facility] [address], [place], [place] [address]  Ammonia     Status: None   Collection Time: 08/06/21  4:53 PM  Result Value Ref Range   Ammonia 12 9 -  35 umol/L    Comment: Performed at [facility], [place] [address], [place], [place] [address]  Blood culture (routine x 2)     Status: None (Preliminary result)   Collection Time: 08/06/21  5:20 PM   Specimen: BLOOD  Result Value Ref Range   Specimen Description BLOOD RIGHT FOREARM    Special Requests      BOTTLES DRAWN AEROBIC AND ANAEROBIC Blood Culture results may not be optimal due to an inadequate volume of blood received in culture bottles   Culture      NO GROWTH < 12 HOURS Performed at [facility] [address] [place], [place] [address]    Report Status PENDING   I-Stat venous blood gas, [facility] ED)     Status: Abnormal   Collection Time: 08/06/21  8:47 PM  Result Value Ref Range   pH, Ven 7.539 (H) 7.250 - 7.430   pCO2, Ven 22.9 (L) 44.0 - 60.0 mmHg   pO2, Ven 188.0 (H) 32.0 - 45.0 mmHg   Bicarbonate 19.5 (L) 20.0 - 28.0 mmol/L   TCO2 20 (L) 22 - 32 mmol/L   O2 Saturation 100.0 %   Acid-base deficit 2.0 0.0 - 2.0 mmol/L   Sodium 124 (L) 135 - 145 mmol/L   Potassium 3.3 (L) 3.5 - 5.1 mmol/L   Calcium, Ion 1.07 (L) 1.15 - 1.40 mmol/L   HCT 35.0 (L) 36.0 - 46.0 %   Hemoglobin 11.9 (L) 12.0 - 15.0 g/dL   Sample type VENOUS   Magnesium     Status: None   Collection Time: 08/07/21  4:04 AM  Result Value Ref Range   Magnesium 1.7 1.7 - 2.4 mg/dL    Comment: Performed at [facility], [place] [address] [place], [place] [address]  Basic metabolic panel     Status: Abnormal   Collection Time: 08/07/21  4:04 AM  Result Value Ref Range   Sodium 131 (L) 135 - 145 mmol/L   Potassium 3.2 (L) 3.5 - 5.1 mmol/L   Chloride 97 (L) 98 - 111 mmol/L   CO2 19 (L) 22 - 32 mmol/L   Glucose, Bld 84 70 - 99 mg/dL    Comment: Glucose reference range applies only to samples taken after fasting for at least 8 hours.   BUN 10 8 - 23 mg/dL   Creatinine, Ser 0.84 0.44 -  1.00 mg/dL   Calcium 9.0 8.9 - 10.3 mg/dL   GFR, Estimated >60 >60 mL/min    Comment: (NOTE) Calculated using  the CKD-EPI Creatinine Equation (2021)    Anion gap 15 5 - 15    Comment: Performed at [facility], [place] [address], [place], [place] [address]  Lipid panel     Status: Abnormal   Collection Time: 08/07/21  4:04 AM  Result Value Ref Range   Cholesterol 250 (H) 0 - 200 mg/dL   Triglycerides 150 (H) <150 mg/dL   HDL 111 >40 mg/dL   Total CHOL/HDL Ratio 2.3 RATIO   VLDL 30 0 - 40 mg/dL   LDL Cholesterol 109 (H) 0 - 99 mg/dL    Comment:        Total Cholesterol/HDL:CHD Risk Coronary Heart Disease Risk Table                     Men   Women  1/2 Average Risk   3.4   3.3  Average Risk       5.0   4.4  2 X Average Risk   9.6   7.1  3 X Average Risk  23.4   11.0        Use the calculated Patient Ratio above and the CHD Risk Table to determine the patient's CHD Risk.        ATP III CLASSIFICATION (LDL):  <100     mg/dL   Optimal  100-129  mg/dL   Near or Above                    Optimal  130-159  mg/dL   Borderline  160-189  mg/dL   High  >190     mg/dL   Very High Performed at [facility] [address] [place], [place] [address]   Urinalysis, Routine w reflex microscopic     Status: Abnormal   Collection Time: 08/07/21  4:06 AM  Result Value Ref Range   Color, Urine YELLOW YELLOW   APPearance CLEAR CLEAR   Specific Gravity, Urine 1.009 1.005 - 1.030   pH 5.0 5.0 - 8.0   Glucose, UA NEGATIVE NEGATIVE mg/dL   Hgb urine dipstick NEGATIVE NEGATIVE   Bilirubin Urine NEGATIVE NEGATIVE   Ketones, ur 80 (A) NEGATIVE mg/dL   Protein, ur NEGATIVE NEGATIVE mg/dL   Nitrite NEGATIVE NEGATIVE   Leukocytes,Ua NEGATIVE NEGATIVE    Comment: Performed at [facility] [address], [place], [place] [address]  Rapid urine drug screen (hospital performed)     Status: Abnormal   Collection Time: 08/07/21  4:06 AM  Result Value Ref Range   Opiates NONE DETECTED NONE DETECTED   Cocaine NONE DETECTED NONE DETECTED   Benzodiazepines POSITIVE (A) NONE DETECTED    Amphetamines NONE DETECTED NONE DETECTED   Tetrahydrocannabinol NONE DETECTED NONE DETECTED   Barbiturates NONE DETECTED NONE DETECTED    Comment: (NOTE) DRUG SCREEN FOR MEDICAL PURPOSES ONLY.  IF CONFIRMATION IS NEEDED FOR ANY PURPOSE, NOTIFY LAB WITHIN 5 DAYS.  LOWEST DETECTABLE LIMITS FOR URINE DRUG SCREEN Drug Class                     Cutoff (ng/mL) Amphetamine and metabolites    1000 Barbiturate and metabolites    200 Benzodiazepine                 226 Tricyclics and metabolites  300 Opiates and metabolites        300 Cocaine and metabolites        300 THC                            50 Performed at [facility], [place] [address] [place], [place] [address]   Hemoglobin A1c     Status: None   Collection Time: 08/07/21  5:00 AM  Result Value Ref Range   Hgb A1c MFr Bld 5.1 4.8 - 5.6 %    Comment: (NOTE) Pre diabetes:          5.7%-6.4%  Diabetes:              >6.4%  Glycemic control for   <7.0% adults with diabetes    Mean Plasma Glucose 99.67 mg/dL    Comment: Performed at [facility] [address], [place],  [address]   DG Ribs Unilateral W/Chest Left  Result Date: 08/06/2021 CLINICAL DATA:  Left wrist pain after falling. Weakness with syncopal episode. EXAM: LEFT RIBS AND CHEST - 3+ VIEW COMPARISON:  Chest radiographs 08/03/2021.  CT 01/30/2010. FINDINGS: The heart size and mediastinal contours are stable. The lungs are clear. There is no pleural effusion or pneumothorax. There are multiple old left-sided rib fractures, involving the left 7th through 10th ribs posteriorly. No acute rib fractures are identified. There is a mildly displaced and comminuted fracture of the distal right clavicle which appears new from 05/29/2021. IMPRESSION: 1. No evidence of acute rib fracture, pleural effusion or pneumothorax. 2. Multiple left-sided rib fractures are unchanged. A fracture of the distal right clavicle is unchanged from recent prior study, although  appears new from 05/29/2021. Electronically Signed   By: Richardean Sale M.D.   On: 08/06/2021 14:47   DG Wrist Complete Left  Result Date: 08/06/2021 CLINICAL DATA:  Fall.  Left wrist pain. EXAM: LEFT WRIST - COMPLETE 3+ VIEW COMPARISON:  Hand radiographs 10/03/2010. FINDINGS: The mineralization and alignment are normal. There is no evidence of acute fracture or dislocation. Moderately advanced degenerative changes are again noted at the 1st carpometacarpal articulation. The additional joint spaces are preserved. There is possible dorsal soft tissue swelling without evidence of foreign body. IMPRESSION: No evidence of acute fracture or dislocation. Possible dorsal soft tissue swelling with chronic degenerative changes at the base of the thumb. Electronically Signed   By: Richardean Sale M.D.   On: 08/06/2021 14:43   CT HEAD WO CONTRAST (5MM)  Result Date: 08/06/2021 CLINICAL DATA:  Head trauma. Weakness. Nausea and vomiting. Syncopal episode. EXAM: CT HEAD WITHOUT CONTRAST CT CERVICAL SPINE WITHOUT CONTRAST TECHNIQUE: Multidetector CT imaging of the head and cervical spine was performed following the standard protocol without intravenous contrast. Multiplanar CT image reconstructions of the cervical spine were also generated. COMPARISON:  10/22/2019.  Brain MRI 12/08/2020. FINDINGS: CT HEAD FINDINGS Brain: Age related volume loss. Chronic small-vessel ischemic changes affect the cerebral hemispheric white matter. There is a small right parietal cortical infarction which could be old or subacute. This was not present on an MRI study of February from this year. No evidence of mass, hemorrhage, hydrocephalus or extra-axial collection. Vascular: There is atherosclerotic calcification of the major vessels at the base of the brain. Skull: No skull fracture. Sinuses/Orbits: No fluid in the sinuses.  Orbits are negative. Other: None CT CERVICAL SPINE FINDINGS Alignment: No traumatic malalignment. Straightening of  the normal cervical lordosis.  2 mm degenerative anterolisthesis C3-4. Skull base and vertebrae: No regional fracture. Soft tissues and spinal canal: No traumatic soft tissue finding. Ordinary atherosclerotic calcification of the carotid siphons. Disc levels: Facet arthropathy on the right at C3-4, C4-5 and C7-T1 and on the left at C2-3 and C7-T1. Degenerative spondylosis with disc space narrowing and bulging of the discs at C3-4, C4-5, C5-6 and C6-7. Foraminal narrowing due to bony encroachment particularly notable on the right at C3-4 and C4-5 and on the left at C2-3, C5-6 and C6-7. Upper chest: Negative Other: None IMPRESSION: Head CT: No traumatic finding. Chronic small-vessel ischemic changes of the white matter. Small right parietal stroke that could be subacute or old, but was not present at the time of MRI done in February of this year. Cervical spine CT: No acute or traumatic finding. Chronic degenerative spondylosis and facet arthropathy as above, not significantly changed since December of 2020. Electronically Signed   By: Nelson Chimes M.D.   On: 08/06/2021 16:54   CT Cervical Spine Wo Contrast  Result Date: 08/06/2021 CLINICAL DATA:  Head trauma. Weakness. Nausea and vomiting. Syncopal episode. EXAM: CT HEAD WITHOUT CONTRAST CT CERVICAL SPINE WITHOUT CONTRAST TECHNIQUE: Multidetector CT imaging of the head and cervical spine was performed following the standard protocol without intravenous contrast. Multiplanar CT image reconstructions of the cervical spine were also generated. COMPARISON:  10/22/2019.  Brain MRI 12/08/2020. FINDINGS: CT HEAD FINDINGS Brain: Age related volume loss. Chronic small-vessel ischemic changes affect the cerebral hemispheric white matter. There is a small right parietal cortical infarction which could be old or subacute. This was not present on an MRI study of February from this year. No evidence of mass, hemorrhage, hydrocephalus or extra-axial collection. Vascular: There  is atherosclerotic calcification of the major vessels at the base of the brain. Skull: No skull fracture. Sinuses/Orbits: No fluid in the sinuses.  Orbits are negative. Other: None CT CERVICAL SPINE FINDINGS Alignment: No traumatic malalignment. Straightening of the normal cervical lordosis. 2 mm degenerative anterolisthesis C3-4. Skull base and vertebrae: No regional fracture. Soft tissues and spinal canal: No traumatic soft tissue finding. Ordinary atherosclerotic calcification of the carotid siphons. Disc levels: Facet arthropathy on the right at C3-4, C4-5 and C7-T1 and on the left at C2-3 and C7-T1. Degenerative spondylosis with disc space narrowing and bulging of the discs at C3-4, C4-5, C5-6 and C6-7. Foraminal narrowing due to bony encroachment particularly notable on the right at C3-4 and C4-5 and on the left at C2-3, C5-6 and C6-7. Upper chest: Negative Other: None IMPRESSION: Head CT: No traumatic finding. Chronic small-vessel ischemic changes of the white matter. Small right parietal stroke that could be subacute or old, but was not present at the time of MRI done in February of this year. Cervical spine CT: No acute or traumatic finding. Chronic degenerative spondylosis and facet arthropathy as above, not significantly changed since December of 2020. Electronically Signed   By: Nelson Chimes M.D.   On: 08/06/2021 16:54   MR ANGIO HEAD WO CONTRAST  Result Date: 08/06/2021 CLINICAL DATA:  Stroke, follow-up. Altered mental status. Multiple falls. Syncopal event. EXAM: MRI HEAD WITHOUT CONTRAST MRA HEAD WITHOUT CONTRAST TECHNIQUE: Multiplanar, multi-echo pulse sequences of the brain and surrounding structures were acquired without intravenous contrast. Angiographic images of the Circle of Willis were acquired using MRA technique without intravenous contrast. COMPARISON:  Head CT earlier same day.  MRI 12/08/2020. FINDINGS: MRI HEAD FINDINGS Brain: Diffusion imaging shows a small acute to subacute  cortical  and subcortical infarction in the right parietal lobe, at the site of the CT abnormality. No evidence of mass effect or hemorrhage. Elsewhere, the brainstem and cerebellum are normal. There are mild to moderate chronic small-vessel ischemic changes of the cerebral hemispheric white matter no mass lesion, hemorrhage, hydrocephalus or extra-axial collection. Vascular: Major vessels at the base of the brain show flow. Skull and upper cervical spine: Negative Sinuses/Orbits: Clear/normal Other: None MRA HEAD FINDINGS Anterior circulation: Both internal carotid arteries are patent through the skull base and siphon regions. The anterior and middle cerebral vessels are patent without evidence of large vessel occlusion or correctable proximal stenosis. There is mild distal vessel atherosclerotic irregularity. Posterior circulation: Both vertebral arteries are widely patent to the basilar. No basilar stenosis. Posterior circulation branch vessels are patent. Mild atherosclerotic irregularity of the PCA branch vessels. Anatomic variants: None significant. 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 Moderate chronic small-vessel ischemic changes elsewhere. Intracranial MR angiography is negative for large vessel occlusion or correctable proximal stenosis. Mild atherosclerotic irregularity of the more distal intracranial branch vessels. Electronically Signed   By: Nelson Chimes M.D.   On: 08/06/2021 18:58   MR BRAIN WO CONTRAST  Result Date: 08/06/2021 CLINICAL DATA:  Stroke, follow-up. Altered mental status. Multiple falls. Syncopal event. EXAM: MRI HEAD WITHOUT CONTRAST MRA HEAD WITHOUT CONTRAST TECHNIQUE: Multiplanar, multi-echo pulse sequences of the brain and surrounding structures were acquired without intravenous contrast. Angiographic images of the Circle of Willis were acquired using MRA technique without  intravenous contrast. COMPARISON:  Head CT earlier same day.  MRI 12/08/2020. FINDINGS: MRI HEAD FINDINGS Brain: Diffusion imaging shows a small acute to subacute cortical and subcortical infarction in the right parietal lobe, at the site of the CT abnormality. No evidence of mass effect or hemorrhage. Elsewhere, the brainstem and cerebellum are normal. There are mild to moderate chronic small-vessel ischemic changes of the cerebral hemispheric white matter no mass lesion, hemorrhage, hydrocephalus or extra-axial collection. Vascular: Major vessels at the base of the brain show flow. Skull and upper cervical spine: Negative Sinuses/Orbits: Clear/normal Other: None MRA HEAD FINDINGS Anterior circulation: Both internal carotid arteries are patent through the skull base and siphon regions. The anterior and middle cerebral vessels are patent without evidence of large vessel occlusion or correctable proximal stenosis. There is mild distal vessel atherosclerotic irregularity. Posterior circulation: Both vertebral arteries are widely patent to the basilar. No basilar stenosis. Posterior circulation branch vessels are patent. Mild atherosclerotic irregularity of the PCA branch vessels. Anatomic variants: None significant. 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 Moderate chronic small-vessel ischemic changes elsewhere. Intracranial MR angiography is negative for large vessel occlusion or correctable proximal stenosis. Mild atherosclerotic irregularity of the more distal intracranial branch vessels. Electronically Signed   By: Nelson Chimes M.D.   On: 08/06/2021 18:58   DG Abd 2 Views  Result Date: 08/06/2021 CLINICAL DATA:  Nausea and vomiting. Altered mental status and frequent falls EXAM: ABDOMEN - 2 VIEW COMPARISON:  None. FINDINGS: Scattered gas and stool throughout the colon. No small or large bowel distention. No free  intra-abdominal air. No abnormal air-fluid levels. No radiopaque stones. Degenerative changes in the spine and hips. Lumbar scoliosis convex towards the right. Lung bases are clear. Old left rib fractures. IMPRESSION: Normal nonobstructive bowel gas pattern. Electronically Signed   By: Oren Beckmann.D.  On: 08/06/2021 19:58   VAS US CAROTID  Result Date: 08/07/2021 Carotid Arterial Duplex Study Patient Name:  [facility] Dahlia Bailiff  Date of Exam:   08/07/2021 Medical Rec #: [id number]        Accession #:    [id number] Date of Birth: 03-May-1946        Patient Gender: F Patient Age:   40 years Exam Location:  [facility] Procedure:      VAS US CAROTID Referring Phys: Wynetta Fines --------------------------------------------------------------------------------  Indications:       CVA and AMS with multiple falls. Risk Factors:      Hypertension, hyperlipidemia, no history of smoking. Other Factors:     AO stenosis. Comparison Study:  No previous exams Performing Technologist: Jody Hill RVT, RDMS  Examination Guidelines: A complete evaluation includes B-mode imaging, spectral Doppler, color Doppler, and power Doppler as needed of all accessible portions of each vessel. Bilateral testing is considered an integral part of a complete examination. Limited examinations for reoccurring indications may be performed as noted.  Right Carotid Findings: +----------+--------+--------+--------+------------------+------------------+           PSV cm/sEDV cm/sStenosisPlaque DescriptionComments           +----------+--------+--------+--------+------------------+------------------+ CCA Prox  71      12                                intimal thickening +----------+--------+--------+--------+------------------+------------------+ CCA Distal66      9                                 intimal thickening +----------+--------+--------+--------+------------------+------------------+ ICA Prox  48      16                                                    +----------+--------+--------+--------+------------------+------------------+ ICA Distal59      18                                                   +----------+--------+--------+--------+------------------+------------------+ ECA       132     17                                                   +----------+--------+--------+--------+------------------+------------------+ +----------+--------+-------+----------------+-------------------+           PSV cm/sEDV cmsDescribe        Arm Pressure (mmHG) +----------+--------+-------+----------------+-------------------+ [id number]            Multiphasic, WNL                    +----------+--------+-------+----------------+-------------------+ +---------+--------+--+--------+--+---------+ VertebralPSV cm/s66EDV cm/s20Antegrade +---------+--------+--+--------+--+---------+  Left Carotid Findings: +----------+--------+--------+--------+---------------------+------------------+           PSV cm/sEDV cm/sStenosisPlaque Description   Comments           +----------+--------+--------+--------+---------------------+------------------+ CCA Prox  70      15  intimal thickening +----------+--------+--------+--------+---------------------+------------------+ CCA Distal55      17                                   intimal thickening +----------+--------+--------+--------+---------------------+------------------+ ICA Prox  54      18              focal and                                                                 heterogenous                            +----------+--------+--------+--------+---------------------+------------------+ ICA Distal63      21                                                      +----------+--------+--------+--------+---------------------+------------------+ ECA       68      9                                                        +----------+--------+--------+--------+---------------------+------------------+ +----------+--------+--------+----------------+-------------------+           PSV cm/sEDV cm/sDescribe        Arm Pressure (mmHG) +----------+--------+--------+----------------+-------------------+ [id number]              Multiphasic, WNL                    +----------+--------+--------+----------------+-------------------+ +---------+--------+--+--------+--+---------+ VertebralPSV cm/s41EDV cm/s10Antegrade +---------+--------+--+--------+--+---------+   Summary: Right Carotid: The extracranial vessels were near-normal with only minimal wall                thickening or plaque. Left Carotid: The extracranial vessels were near-normal with only minimal wall               thickening or plaque. Vertebrals:  Bilateral vertebral arteries demonstrate antegrade flow. Subclavians: Normal flow hemodynamics were seen in bilateral subclavian              arteries. *See table(s) above for measurements and observations.  Electronically signed by Antony Contras MD on 08/07/2021 at 12:54:16 PM.    Final     Pending Labs Unresulted Labs (From admission, onward)     Start     Ordered   08/08/21 5638  Basic metabolic panel  Tomorrow morning,   R        08/07/21 1301   08/06/21 1807  TSH  Add-on,   AD        08/06/21 1806            Vitals/Pain Today's Vitals   08/07/21 0600 08/07/21 0700 08/07/21 1000 08/07/21 1149  BP: (!) 151/67 (!) 160/84 (!) 138/121 (!) 170/90  Pulse: (!) 56 (!) 58 61   Resp: 17 (!) 21 18   Temp:      TempSrc:  SpO2: 97% 100% 99%   Weight:      Height:      PainSc:        Isolation Precautions No active isolations  Medications Medications  lidocaine (LIDODERM) 5 % 1 patch (1 patch Transdermal Patch Removed 08/07/21 0852)  acetaminophen (TYLENOL) tablet 650 mg (has no administration in time range)    Or  acetaminophen (TYLENOL) 160 MG/5ML  solution 650 mg (has no administration in time range)    Or  acetaminophen (TYLENOL) suppository 650 mg (has no administration in time range)  enoxaparin (LOVENOX) injection 40 mg (40 mg Subcutaneous Given 08/06/21 1954)  aspirin chewable tablet 81 mg (has no administration in time range)  amLODipine (NORVASC) tablet 10 mg (10 mg Oral Given 08/07/21 1149)  Bempedoic Acid TABS 180 mg (180 mg Oral Not Given 08/07/21 1209)  ezetimibe (ZETIA) tablet 10 mg (0 mg Oral Hold 08/07/21 1211)  nebivolol (BYSTOLIC) tablet 10 mg (10 mg Oral Given 08/07/21 1148)  FLUoxetine (PROZAC) capsule 40 mg (40 mg Oral Given 08/07/21 1152)  levothyroxine (SYNTHROID) tablet 125 mcg (has no administration in time range)  famotidine (PEPCID) tablet 20 mg (has no administration in time range)  mirabegron ER (MYRBETRIQ) tablet 50 mg (50 mg Oral Given 08/07/21 1153)  carisoprodol (SOMA) tablet 350 mg (350 mg Oral Given 08/07/21 1147)  feeding supplement (GLUCERNA SHAKE) (GLUCERNA SHAKE) liquid 237 mL (has no administration in time range)  levalbuterol (XOPENEX) nebulizer solution 0.63 mg (has no administration in time range)  potassium & sodium phosphates (PHOS-NAK) 280-160-250 MG packet 1 packet (1 packet Oral Not Given 08/07/21 1220)  meclizine (ANTIVERT) tablet 12.5 mg (has no administration in time range)  clopidogrel (PLAVIX) tablet 75 mg (75 mg Oral Given 08/07/21 1146)  LORazepam (ATIVAN) injection 1 mg (has no administration in time range)  phenazopyridine (PYRIDIUM) tablet 100 mg (has no administration in time range)  acetaminophen (TYLENOL) tablet 1,000 mg (1,000 mg Oral Given 08/06/21 1929)  sodium chloride 0.9 % bolus 1,000 mL (0 mLs Intravenous Stopped 08/06/21 2157)   stroke: mapping our early stages of recovery book ( Does not apply Given 08/06/21 1955)  magnesium sulfate IVPB 2 g 50 mL (0 g Intravenous Stopped 08/06/21 2046)    Mobility walks High fall risk   Focused Assessments         R Recommendations: See Admitting Provider Note  Report given to:   Additional Notes

## 2021-08-07 NOTE — Progress Notes (Signed)
Carotid duplex has been completed.  Results can be found under chart review under CV PROC. 08/07/2021 12:36 PM Delton Stelle RVT, RDMS

## 2021-08-08 ENCOUNTER — Observation Stay (HOSPITAL_COMMUNITY): Payer: Medicare Other

## 2021-08-08 ENCOUNTER — Observation Stay (HOSPITAL_BASED_OUTPATIENT_CLINIC_OR_DEPARTMENT_OTHER): Payer: Medicare Other

## 2021-08-08 DIAGNOSIS — R112 Nausea with vomiting, unspecified: Secondary | ICD-10-CM | POA: Diagnosis not present

## 2021-08-08 DIAGNOSIS — I639 Cerebral infarction, unspecified: Secondary | ICD-10-CM | POA: Diagnosis not present

## 2021-08-08 DIAGNOSIS — I6389 Other cerebral infarction: Secondary | ICD-10-CM | POA: Diagnosis not present

## 2021-08-08 DIAGNOSIS — W19XXXA Unspecified fall, initial encounter: Secondary | ICD-10-CM | POA: Diagnosis not present

## 2021-08-08 DIAGNOSIS — R109 Unspecified abdominal pain: Secondary | ICD-10-CM | POA: Diagnosis not present

## 2021-08-08 DIAGNOSIS — R1110 Vomiting, unspecified: Secondary | ICD-10-CM | POA: Diagnosis not present

## 2021-08-08 DIAGNOSIS — Y92009 Unspecified place in unspecified non-institutional (private) residence as the place of occurrence of the external cause: Secondary | ICD-10-CM | POA: Diagnosis not present

## 2021-08-08 HISTORY — PX: TRANSTHORACIC ECHOCARDIOGRAM: SHX275

## 2021-08-08 LAB — BASIC METABOLIC PANEL
Anion gap: 11 (ref 5–15)
BUN: 7 mg/dL — ABNORMAL LOW (ref 8–23)
CO2: 25 mmol/L (ref 22–32)
Calcium: 8.8 mg/dL — ABNORMAL LOW (ref 8.9–10.3)
Chloride: 96 mmol/L — ABNORMAL LOW (ref 98–111)
Creatinine, Ser: 0.75 mg/dL (ref 0.44–1.00)
GFR, Estimated: >60 mL/min (ref >60)
Glucose, Bld: 90 mg/dL (ref 70–99)
Potassium: 3.2 mmol/L — ABNORMAL LOW (ref 3.5–5.1)
Sodium: 132 mmol/L — ABNORMAL LOW (ref 135–145)

## 2021-08-08 LAB — ECHOCARDIOGRAM COMPLETE
AR max vel: 1.33 cm2
AV Area VTI: 1.35 cm2
AV Area mean vel: 1.29 cm2
AV Mean grad: 10 mmHg
AV Peak grad: 18.7 mmHg
Ao pk vel: 2.16 m/s
Area-P 1/2: 3.06 cm2
Height: 64.5 in
S' Lateral: 2.3 cm
Weight: 2112 oz

## 2021-08-08 MED ORDER — LIDOCAINE VISCOUS HCL 2 % MT SOLN
15.0000 mL | Freq: Once | OROMUCOSAL | Status: AC
  Administered 2021-08-08: 15 mL via ORAL
  Filled 2021-08-08: qty 15

## 2021-08-08 MED ORDER — IOHEXOL 9 MG/ML PO SOLN
ORAL | Status: AC
  Administered 2021-08-08: 500 mL
  Filled 2021-08-08: qty 1000

## 2021-08-08 MED ORDER — IOHEXOL 300 MG/ML  SOLN
100.0000 mL | Freq: Once | INTRAMUSCULAR | Status: AC | PRN
  Administered 2021-08-08: 100 mL via INTRAVENOUS

## 2021-08-08 MED ORDER — SUCRALFATE 1 GM/10ML PO SUSP
1.0000 g | Freq: Three times a day (TID) | ORAL | Status: DC
  Administered 2021-08-08 – 2021-08-09 (×5): 1 g via ORAL
  Filled 2021-08-08 (×7): qty 10

## 2021-08-08 MED ORDER — ALUM & MAG HYDROXIDE-SIMETH 200-200-20 MG/5ML PO SUSP
30.0000 mL | Freq: Once | ORAL | Status: AC
  Administered 2021-08-08: 30 mL via ORAL
  Filled 2021-08-08: qty 30

## 2021-08-08 MED ORDER — PANTOPRAZOLE SODIUM 40 MG IV SOLR
40.0000 mg | Freq: Once | INTRAVENOUS | Status: AC
  Administered 2021-08-08: 40 mg via INTRAVENOUS
  Filled 2021-08-08: qty 40

## 2021-08-08 MED ORDER — PROCHLORPERAZINE EDISYLATE 10 MG/2ML IJ SOLN
5.0000 mg | Freq: Once | INTRAMUSCULAR | Status: AC
  Administered 2021-08-08: 5 mg via INTRAVENOUS
  Filled 2021-08-08: qty 2

## 2021-08-08 NOTE — Progress Notes (Signed)
PT Cancellation Note  Patient Details Name: Marissa [name] MRN: [id number] DOB: [birth year]   Cancelled Treatment:    Reason Eval/Treat Not Completed: Medical issues which prohibited therapy; patient currently with chills and vomiting.  Will attempt again later as time permits.   Reginia Naas 08/08/2021, 10:12 AM Magda Kiel, PT Acute [facility] NOPWK:[phone] [phone] 08/08/2021

## 2021-08-08 NOTE — Progress Notes (Signed)
PROGRESS NOTE  Marissa [name]    DOB: 04-16-1946, 75 y.o.  UTM:[id number]  PCP: [name], MD   Code Status: Full Code   DOA: 08/06/2021   LOS: 1  Brief Narrative of Current Hospitalization  Marissa [name] is a 75 y.o. female with a PMH significant for cognitive impairment, asthma, anxiety/depression, HTN, HLD. They presented from home to the ED on 08/06/2021 with dizziness and falls x several weeks. In the ED, it was found that they had new to subacute infarction of parietal lobe.  Neurology was consulted Patient was admitted to medicine service for further workup and management of dizziness as outlined in detail below.  08/08/21 - epigastric pain with nausea  Assessment & Plan  Active Problems:   Frequent falls   Fall at home, initial encounter   Stroke (cerebrum) (HCC)  Dizziness, near syncope and fall-patient complains of left rib pain -Likely secondary to memantine side effect, discontinue memantine -Orthostatic vital signs, PT evaluation.  As needed Meclizine -Since April of this year, patient had tried Aricept, rivastigmine, and now memantine, and appears that she did not tolerate either of them because of the side effects. -Pain medications as needed -Continue meclizine -Discontinue Singulair -PT/OT/SLP evaluation - neurology following, appreciate recommendations   Subacute right parietal stroke-no focal findings on neurological exam. Remains symptomatic of dizziness which has improved since discontinuing memantine -Continue aspirin, add plavix -Neurology following -Echo pending -Carotid duplex unremarkable -Continue Zetia, history of anaphylaxis to statin  Dysuria-patient endorses dysuria and suprapubic pain on exam.  UA overall normal with signs of possible dehydration. -Azo trial   Tachycardia- resolved.   Nausea and vomiting- continues.  Endorses odynophagia and dysphagia.  Pain was improved with lidocaine topical. - Qtc prolonged so limits choice  of agents -Trial of Ativan which will help with anxiety and nausea hopefully -Compazine as needed - abdominal CT  -If CT is unremarkable, consult SLP for swallow evaluation   COPD/asthma- Stable   Hypothyroidism-TSH pending   HTN-elevated -Continue beta-blocker, amlodipine   Chronic rib fracture and pain- -Lidocaine patch.  Anxiety/depression- -Continue fluoxetine -Ativan added  DVT prophylaxis: enoxaparin (LOVENOX) injection 40 mg Start: 08/06/21 1900   Diet:  Diet Orders (From admission, onward)     Start     Ordered   08/06/21 1804  Diet Heart Room service appropriate? Yes; Fluid consistency: Thin  Diet effective now       Question Answer Comment  Room service appropriate? Yes   Fluid consistency: Thin      08/06/21 1805            Subjective 08/08/21    Pt reports nausea, vomiting associated with epigastric pain.  Disposition Plan & Communication  Status is: Inpatient  Remains inpatient appropriate because:Ongoing diagnostic testing needed not appropriate for outpatient work up  Dispo: The patient is from: Home              Anticipated d/c is to: Home              Patient currently is not medically stable to d/c.     Family Communication: Husband at bedside  Consults, Procedures, Significant Events  Consultants:  Neurology  Procedures/significant events:  None  Antimicrobials:  Anti-infectives (From admission, onward)    None        Objective   Vitals:   08/07/21 1625 08/07/21 2058 08/07/21 2330 08/08/21 0336  BP: (!) 131/91 (!) 175/97 137/81 (!) 151/78  Pulse: (!) 57 62 [name] Kitchen)  58 (!) 55  Resp: 20 18 17 16   Temp: 98.4 F (36.9 C) 100.3 F (37.9 C) 98.6 F (37 C) 98.1 F (36.7 C)  TempSrc: Oral Oral Oral Oral  SpO2: 100% 99% 99% 98%  Weight:      Height:       No intake or output data in the 24 hours ending 08/08/21 0724  Filed Weights   08/06/21 1242  Weight: 59.9 kg    Patient BMI: Body mass index is 22.31 kg/m.    Physical Exam: General: awake, alert, NAD HEENT: atraumatic, clear conjunctiva, anicteric sclera, moist mucus membranes, hearing grossly normal -Thinning hair Respiratory: normal respiratory effort. Cardiovascular: quick capillary refill  Gastrointestinal: soft, tender to epigastric area, ND, no HSM felt Nervous: A&O x3. no gross focal neurologic deficits, normal speech Extremities: moves all equally, no edema, normal tone Skin: dry, intact, normal temperature, normal color, No rashes, lesions or ulcers Psychiatry: Anxious mood, pressured speech  Labs   I have personally reviewed following labs and imaging studies Admission on 08/06/2021  Component Date Value Ref Range Status   Sodium 08/06/2021 129 (A) 135 - 145 mmol/L Final   Potassium 08/06/2021 4.1  3.5 - 5.1 mmol/L Final   Chloride 08/06/2021 90 (A) 98 - 111 mmol/L Final   CO2 08/06/2021 18 (A) 22 - 32 mmol/L Final   Glucose, Bld 08/06/2021 82  70 - 99 mg/dL Final   BUN 08/06/2021 12  8 - 23 mg/dL Final   Creatinine, Ser 08/06/2021 0.92  0.44 - 1.00 mg/dL Final   Calcium 08/06/2021 10.1  8.9 - 10.3 mg/dL Final   Total Protein 08/06/2021 7.1  6.5 - 8.1 g/dL Final   Albumin 08/06/2021 4.3  3.5 - 5.0 g/dL Final   AST 08/06/2021 36  15 - 41 U/L Final   ALT 08/06/2021 22  0 - 44 U/L Final   Alkaline Phosphatase 08/06/2021 45  38 - 126 U/L Final   Total Bilirubin 08/06/2021 1.3 (A) 0.3 - 1.2 mg/dL Final   GFR, Estimated 08/06/2021 >60  >60 mL/min Final   Anion gap 08/06/2021 21 (A) 5 - 15 Final   WBC 08/06/2021 9.4  4.0 - 10.5 K/uL Final   RBC 08/06/2021 4.24  3.87 - 5.11 MIL/uL Final   Hemoglobin 08/06/2021 13.1  12.0 - 15.0 g/dL Final   HCT 08/06/2021 36.4  36.0 - 46.0 % Final   MCV 08/06/2021 85.8  80.0 - 100.0 fL Final   MCH 08/06/2021 30.9  26.0 - 34.0 pg Final   MCHC 08/06/2021 36.0  30.0 - 36.0 g/dL Final   RDW 08/06/2021 14.0  11.5 - 15.5 % Final   Platelets 08/06/2021 219  150 - 400 K/uL Final   nRBC 08/06/2021  0.0  0.0 - 0.2 % Final   Neutrophils Relative % 08/06/2021 86  % Final   Neutro Abs 08/06/2021 8.0 (A) 1.7 - 7.7 K/uL Final   Lymphocytes Relative 08/06/2021 6  % Final   Lymphs Abs 08/06/2021 0.6 (A) 0.7 - 4.0 K/uL Final   Monocytes Relative 08/06/2021 8  % Final   Monocytes Absolute 08/06/2021 0.7  0.1 - 1.0 K/uL Final   Eosinophils Relative 08/06/2021 0  % Final   Eosinophils Absolute 08/06/2021 0.0  0.0 - 0.5 K/uL Final   Basophils Relative 08/06/2021 0  % Final   Basophils Absolute 08/06/2021 0.0  0.0 - 0.1 K/uL Final   Immature Granulocytes 08/06/2021 0  % Final   Abs Immature Granulocytes 08/06/2021 0.03  0.00 - 0.07 K/uL Final   Troponin I (High Sensitivity) 08/06/2021 9  <18 ng/L Final   Lactic Acid, Venous 08/06/2021 2.2 (A) 0.5 - 1.9 mmol/L Final   Lactic Acid, Venous 08/06/2021 1.7  0.5 - 1.9 mmol/L Final   Lipase 08/06/2021 28  11 - 51 U/L Final   Color, Urine 08/07/2021 YELLOW  YELLOW Final   APPearance 08/07/2021 CLEAR  CLEAR Final   Specific Gravity, Urine 08/07/2021 1.009  1.005 - 1.030 Final   pH 08/07/2021 5.0  5.0 - 8.0 Final   Glucose, UA 08/07/2021 NEGATIVE  NEGATIVE mg/dL Final   Hgb urine dipstick 08/07/2021 NEGATIVE  NEGATIVE Final   Bilirubin Urine 08/07/2021 NEGATIVE  NEGATIVE Final   Ketones, ur 08/07/2021 80 (A) NEGATIVE mg/dL Final   Protein, ur 08/07/2021 NEGATIVE  NEGATIVE mg/dL Final   Nitrite 08/07/2021 NEGATIVE  NEGATIVE Final   Leukocytes,Ua 08/07/2021 NEGATIVE  NEGATIVE Final   Opiates 08/07/2021 NONE DETECTED  NONE DETECTED Final   Cocaine 08/07/2021 NONE DETECTED  NONE DETECTED Final   Benzodiazepines 08/07/2021 POSITIVE (A) NONE DETECTED Final   Amphetamines 08/07/2021 NONE DETECTED  NONE DETECTED Final   Tetrahydrocannabinol 08/07/2021 NONE DETECTED  NONE DETECTED Final   Barbiturates 08/07/2021 NONE DETECTED  NONE DETECTED Final   Alcohol, Ethyl (B) 08/06/2021 <10  <10 mg/dL Final   Salicylate Lvl 74/25/9563 <7.0 (A) 7.0 - 30.0 mg/dL Final    Acetaminophen (Tylenol), Serum 08/06/2021 <10 (A) 10 - 30 ug/mL Final   Specimen Description 08/06/2021 BLOOD RIGHT FOREARM   Final   Special Requests 08/06/2021 BOTTLES DRAWN AEROBIC AND ANAEROBIC Blood Culture results may not be optimal due to an inadequate volume of blood received in culture bottles   Final   Culture 08/06/2021    Final                   Value:NO GROWTH < 12 HOURS Performed at [facility], [place] [address] [place], [place] [address]    Report Status 08/06/2021 PENDING   Incomplete   Specimen Description 08/06/2021 BLOOD SITE NOT SPECIFIED   Final   Special Requests 08/06/2021 BOTTLES DRAWN AEROBIC AND ANAEROBIC Blood Culture results may not be optimal due to an inadequate volume of blood received in culture bottles   Final   Culture 08/06/2021    Final                   Value:NO GROWTH < 12 HOURS Performed at [facility], [place] [address] [place], [place] [address]    Report Status 08/06/2021 PENDING   Incomplete   SARS Coronavirus 2 by RT PCR 08/06/2021 NEGATIVE  NEGATIVE Final   Influenza A by PCR 08/06/2021 NEGATIVE  NEGATIVE Final   Influenza B by PCR 08/06/2021 NEGATIVE  NEGATIVE Final   Troponin I (High Sensitivity) 08/06/2021 10  <18 ng/L Final   Magnesium 08/06/2021 1.2 (A) 1.7 - 2.4 mg/dL Final   Ammonia 08/06/2021 12  9 - 35 umol/L Final   Hgb A1c MFr Bld 08/07/2021 5.1  4.8 - 5.6 % Final   Mean Plasma Glucose 08/07/2021 99.67  mg/dL Final   Weight 08/07/2021 2,112  oz    Height 08/07/2021 64.5  in    BP 08/07/2021 144/77  mmHg    Magnesium 08/07/2021 1.7  1.7 - 2.4 mg/dL Final   Sodium 08/07/2021 131 (A) 135 - 145 mmol/L Final   Potassium 08/07/2021 3.2 (A) 3.5 - 5.1 mmol/L Final   Chloride  08/07/2021 97 (A) 98 - 111 mmol/L Final   CO2 08/07/2021 19 (A) 22 - 32 mmol/L Final   Glucose, Bld 08/07/2021 84  70 - 99 mg/dL Final   BUN 08/07/2021 10  8 - 23 mg/dL Final   Creatinine, Ser 08/07/2021 0.84  0.44 - 1.00 mg/dL Final   Calcium  08/07/2021 9.0  8.9 - 10.3 mg/dL Final   GFR, Estimated 08/07/2021 >60  >60 mL/min Final   Anion gap 08/07/2021 15  5 - 15 Final   pH, Ven 08/06/2021 7.539 (A) 7.250 - 7.430 Final   pCO2, Ven 08/06/2021 22.9 (A) 44.0 - 60.0 mmHg Final   pO2, Ven 08/06/2021 188.0 (A) 32.0 - 45.0 mmHg Final   Bicarbonate 08/06/2021 19.5 (A) 20.0 - 28.0 mmol/L Final   TCO2 08/06/2021 20 (A) 22 - 32 mmol/L Final   O2 Saturation 08/06/2021 100.0  % Final   Acid-base deficit 08/06/2021 2.0  0.0 - 2.0 mmol/L Final   Sodium 08/06/2021 124 (A) 135 - 145 mmol/L Final   Potassium 08/06/2021 3.3 (A) 3.5 - 5.1 mmol/L Final   Calcium, Ion 08/06/2021 1.07 (A) 1.15 - 1.40 mmol/L Final   HCT 08/06/2021 35.0 (A) 36.0 - 46.0 % Final   Hemoglobin 08/06/2021 11.9 (A) 12.0 - 15.0 g/dL Final   Sample type 08/06/2021 VENOUS   Final   Cholesterol 08/07/2021 250 (A) 0 - 200 mg/dL Final   Triglycerides 08/07/2021 150 (A) <150 mg/dL Final   HDL 08/07/2021 111  >40 mg/dL Final   Total CHOL/HDL Ratio 08/07/2021 2.3  RATIO Final   VLDL 08/07/2021 30  0 - 40 mg/dL Final   LDL Cholesterol 08/07/2021 109 (A) 0 - 99 mg/dL Final   Sodium 08/08/2021 132 (A) 135 - 145 mmol/L Final   Potassium 08/08/2021 3.2 (A) 3.5 - 5.1 mmol/L Final   Chloride 08/08/2021 96 (A) 98 - 111 mmol/L Final   CO2 08/08/2021 25  22 - 32 mmol/L Final   Glucose, Bld 08/08/2021 90  70 - 99 mg/dL Final   BUN 08/08/2021 7 (A) 8 - 23 mg/dL Final   Creatinine, Ser 08/08/2021 0.75  0.44 - 1.00 mg/dL Final   Calcium 08/08/2021 8.8 (A) 8.9 - 10.3 mg/dL Final   GFR, Estimated 08/08/2021 >60  >60 mL/min Final   Anion gap 08/08/2021 11  5 - 15 Final   Prothrombin Time 08/07/2021 12.6  11.4 - 15.2 seconds Final   INR 08/07/2021 0.9  0.8 - 1.2 Final   aPTT 08/07/2021 24  24 - 36 seconds Final    Imaging Studies  VAS US CAROTID  Result Date: 08/07/2021 Carotid Arterial Duplex Study Patient Name:  Marissa [name]  Date of Exam:   08/07/2021 Medical Rec #: [id number]         Accession #:    [id number] Date of Birth: 12/17/1945        Patient Gender: F Patient Age:   79 years Exam Location:  [facility] Procedure:      VAS US CAROTID Referring Phys: Wynetta Fines --------------------------------------------------------------------------------  Indications:       CVA and AMS with multiple falls. Risk Factors:      Hypertension, hyperlipidemia, no history of smoking. Other Factors:     AO stenosis. Comparison Study:  No previous exams Performing Technologist: Jody Hill RVT, RDMS  Examination Guidelines: A complete evaluation includes B-mode imaging, spectral Doppler, color Doppler, and power Doppler as needed of all accessible portions of each vessel. Bilateral testing is  considered an integral part of a complete examination. Limited examinations for reoccurring indications may be performed as noted.  Right Carotid Findings: +----------+--------+--------+--------+------------------+------------------+           PSV cm/sEDV cm/sStenosisPlaque DescriptionComments           +----------+--------+--------+--------+------------------+------------------+ CCA Prox  71      12                                intimal thickening +----------+--------+--------+--------+------------------+------------------+ CCA Distal66      9                                 intimal thickening +----------+--------+--------+--------+------------------+------------------+ ICA Prox  48      16                                                   +----------+--------+--------+--------+------------------+------------------+ ICA Distal59      18                                                   +----------+--------+--------+--------+------------------+------------------+ ECA       132     17                                                   +----------+--------+--------+--------+------------------+------------------+ +----------+--------+-------+----------------+-------------------+            PSV cm/sEDV cmsDescribe        Arm Pressure (mmHG) +----------+--------+-------+----------------+-------------------+ [id number]            Multiphasic, WNL                    +----------+--------+-------+----------------+-------------------+ +---------+--------+--+--------+--+---------+ VertebralPSV cm/s66EDV cm/s20Antegrade +---------+--------+--+--------+--+---------+  Left Carotid Findings: +----------+--------+--------+--------+---------------------+------------------+           PSV cm/sEDV cm/sStenosisPlaque Description   Comments           +----------+--------+--------+--------+---------------------+------------------+ CCA Prox  70      15                                   intimal thickening +----------+--------+--------+--------+---------------------+------------------+ CCA Distal55      17                                   intimal thickening +----------+--------+--------+--------+---------------------+------------------+ ICA Prox  54      18              focal and                                                                 heterogenous                            +----------+--------+--------+--------+---------------------+------------------+  ICA Distal63      21                                                      +----------+--------+--------+--------+---------------------+------------------+ ECA       68      9                                                       +----------+--------+--------+--------+---------------------+------------------+ +----------+--------+--------+----------------+-------------------+           PSV cm/sEDV cm/sDescribe        Arm Pressure (mmHG) +----------+--------+--------+----------------+-------------------+ [id number]              Multiphasic, WNL                    +----------+--------+--------+----------------+-------------------+ +---------+--------+--+--------+--+---------+  VertebralPSV cm/s41EDV cm/s10Antegrade +---------+--------+--+--------+--+---------+   Summary: Right Carotid: The extracranial vessels were near-normal with only minimal wall                thickening or plaque. Left Carotid: The extracranial vessels were near-normal with only minimal wall               thickening or plaque. Vertebrals:  Bilateral vertebral arteries demonstrate antegrade flow. Subclavians: Normal flow hemodynamics were seen in bilateral subclavian              arteries. *See table(s) above for measurements and observations.  Electronically signed by Antony Contras MD on 08/07/2021 at 12:54:16 PM.    Final    Medications   Scheduled Meds:  amLODipine  10 mg Oral Daily   aspirin  81 mg Oral Daily   Bempedoic Acid  180 mg Oral Daily   clopidogrel  75 mg Oral Daily   enoxaparin (LOVENOX) injection  40 mg Subcutaneous Q24H   ezetimibe  10 mg Oral Daily   FLUoxetine  40 mg Oral BID   influenza vaccine adjuvanted  0.5 mL Intramuscular Tomorrow-1000   levothyroxine  125 mcg Oral QAC breakfast   lidocaine  1 patch Transdermal Q24H   mirabegron ER  50 mg Oral Daily   nebivolol  10 mg Oral Daily   pantoprazole  40 mg Oral Daily   phenazopyridine  100 mg Oral TID WC     LOS: 1 day   Time spent: >82mn  CRicharda Osmond DO [facility] 08/08/2021, 7:24 AM   To contact the T[facility] Medical GroupAttending or Consulting provider for this patient: Check the care team in [facility]for a) attending/consulting THoop[place]provider listed and b) the TM[facility] Hospitalteam listed Log into [email or web address] and use [place]'s universal password to access. If you do not have the password, please contact the hospital operator. Locate the TOakdale Community Hospitalprovider you are looking for under [facility] and page to a number that you can be directly reached. If you still have difficulty reaching the provider, please page the [facility](Director on Call) for the Hospitalists listed on amion for assistance.

## 2021-08-08 NOTE — TOC Initial Note (Signed)
Transition of Care [facility]) - Initial/Assessment Note    Patient Details  Name: Marissa [name] MRN: [id number] Date of Birth: 04/08/1946  [facility] CM/SW Contact:    Pollie Friar, RN Phone Number: 08/08/2021, 2:46 PM  Clinical Narrative:                 Patient is from home with spouse who can provide 24 hour supervision. Pt has all needed DME at home.  Patient asked to use [facility] for [facility]. Cindy with [facility] accepted the referral.  Pt denies issues with home meds or transportation. Spouse will provide transport home when medically ready.   Expected Discharge Plan: [place] Barriers to Discharge: Continued Medical Work up   Patient Goals and CMS Choice   CMS Medicare.gov Compare Post Acute Care list provided to:: Patient Represenative (must comment) Choice offered to / list presented to : Patient, Spouse  Expected Discharge Plan and Services Expected Discharge Plan: [place]   Discharge Planning Services: CM Consult Post Acute Care Choice: [place] arrangements for the past 2 months: Single Family Home                           HH Arranged: PT, OT HH Agency: [place] Date [place]: 08/08/21   Representative spoke with at [place]: Jenny Reichmann  Prior Living Arrangements/Services Living arrangements for the past 2 months: [place] Lives with:: Spouse Patient language and need for interpreter reviewed:: Yes Do you feel safe going back to the place where you live?: Yes      Need for Family Participation in Patient Care: Yes (Comment) Care giver support system in place?: Yes (comment) Current home services: DME (walker/ rollator/ wheelchair/ 3 in 1/ shower seat) Criminal Activity/Legal Involvement Pertinent to Current Situation/Hospitalization: No - Comment as needed  Activities of Daily Living Home Assistive Devices/Equipment: None ADL Screening (condition at time of  admission) Patient's cognitive ability adequate to safely complete daily activities?: Yes Is the patient deaf or have difficulty hearing?: No Does the patient have difficulty seeing, even when wearing glasses/contacts?: No Does the patient have difficulty concentrating, remembering, or making decisions?: No Patient able to express need for assistance with ADLs?: Yes Does the patient have difficulty dressing or bathing?: No Independently performs ADLs?: Yes (appropriate for developmental age) Does the patient have difficulty walking or climbing stairs?: Yes Weakness of Legs: Both Weakness of Arms/Hands: None  Permission Sought/Granted                  Emotional Assessment Appearance:: Appears stated age Attitude/Demeanor/Rapport: Engaged Affect (typically observed): Accepting Orientation: : Oriented to Self, Oriented to Place, Oriented to  Time, Oriented to Situation   Psych Involvement: No (comment)  Admission diagnosis:  Stroke (cerebrum) (HCC) [I63.9] Frequent falls [R29.6] Intractable nausea and vomiting [R11.2] Altered mental status, unspecified altered mental status type [R41.82] Patient Active Problem List   Diagnosis Date Noted   Altered mental status    Intractable nausea and vomiting    Fall at home, initial encounter 43/32/9518   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[place]) 84/16/6063   Chronic cough 06/01/2021   Memory loss 07/15/2020   Malnutrition ([place]) 12/22/2018   Hypokalemia 12/22/2018   Hypomagnesemia 12/22/2018   Hypophosphatemia 12/22/2018   Anxiety    Prolonged QT interval 12/21/2018   Hyponatremia 12/21/2018   Hyperlipidemia with target LDL less than 100 10/02/2018  Encounter for chronic pain management 11/28/2017   Dyspnea on exertion 06/01/2017   Mild aortic stenosis 05/27/2017   Routine general medical examination at a health care facility 07/09/2016   Frequent falls 03/28/2016   Rosacea 08/18/2015   Lumbar radiculopathy 07/07/2015   Urinary incontinence  06/20/2015   Hemorrhoids 10/06/2014   Cough variant asthma vs UACS 08/03/2014   Labile hypertension 02/22/2014   Insomnia secondary to chronic pain 07/09/2012   B12 deficiency 04/03/2011   Unspecified vitamin D deficiency 07/25/2010   ANEMIA, IRON DEFICIENCY 04/25/2009   Allergic rhinitis 02/01/2009   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

## 2021-08-08 NOTE — Evaluation (Signed)
Occupational Therapy Evaluation Patient Details Name: Marissa [name] MRN: [id number] DOB: [birth year] Today's Date: 08/08/2021   History of Present Illness Pt is 75 yo female who presented with repeated falls, dizziness, nightmares, anxiety on 08/06/21.  Pt had recently started Namenda for cognitive impairments and then symptoms began. Pt also found to have acute/subacute R parietal CVA.  Namenda has been discontinued.  Pt with hx of mild cognitive impairment, asthma, anxiety/depression, HTN, HLD   Clinical Impression   Pt admitted for concerns listed above. PTA pt reported that she was independent with all ADL's and most IADL's, however her husband does assist with IADL's as needed. Pt reports using a rollator at times when she feels more weak. At this time pt requiring min guard for all ADL's and functional mobility due to decreased safety awareness, multiple falls, and balance deficits. Pt educated on importance of using her RW and will benefit from 24/7 supervision. OT will continue to follow acutely.      Recommendations for follow up therapy are one component of a multi-disciplinary discharge planning process, led by the attending physician.  Recommendations may be updated based on patient status, additional functional criteria and insurance authorization.   Follow Up Recommendations  Home health OT    Equipment Recommendations  None recommended by OT    Recommendations for Other Services       Precautions / Restrictions Precautions Precautions: Fall Restrictions Weight Bearing Restrictions: No      Mobility Bed Mobility Overal bed mobility: Needs Assistance Bed Mobility: Rolling;Supine to Sit;Sit to Supine Rolling: Supervision   Supine to sit: Min guard Sit to supine: Min guard   General bed mobility comments: increased time, min gaurd for safety    Transfers Overall transfer level: Needs assistance Equipment used: None Transfers: Sit to/from Stand Sit to Stand:  Min guard         General transfer comment: min guard for safety    Balance Overall balance assessment: Needs assistance Sitting-balance support: Feet supported;Single extremity supported Sitting balance-Leahy Scale: Fair Sitting balance - Comments: reliant on some support   Standing balance support: No upper extremity supported;During functional activity Standing balance-Leahy Scale: Poor Standing balance comment: Pt unbalanced, requring min gaurd at all times when standing                           ADL either performed or assessed with clinical judgement   ADL Overall ADL's : Needs assistance/impaired Eating/Feeding: Independent;Sitting   Grooming: Min guard;Standing   Upper Body Bathing: Supervision/ safety;Sitting   Lower Body Bathing: Min guard;Sitting/lateral leans;Sit to/from stand   Upper Body Dressing : Independent;Sitting   Lower Body Dressing: Min guard;Sitting/lateral leans;Sit to/from stand   Toilet Transfer: Min guard;Ambulation   Toileting- Clothing Manipulation and Hygiene: Min guard;Sitting/lateral lean;Sit to/from stand       Functional mobility during ADLs: Min guard General ADL Comments: Pt requiring min guard for safety as she demonstrates decreased balance and safety awareness this session.     Vision Baseline Vision/History: 0 No visual deficits Ability to See in Adequate Light: 0 Adequate Patient Visual Report: No change from baseline Vision Assessment?: No apparent visual deficits Additional Comments: Peripheral intact, gaze symmetrical, tracking smooth     Perception Perception Perception Tested?: No   Praxis Praxis Praxis tested?: Within functional limits    Pertinent Vitals/Pain Pain Assessment: No/denies pain     Hand Dominance Right   Extremity/Trunk Assessment Upper Extremity Assessment Upper  Extremity Assessment: Generalized weakness RUE Deficits / Details: ROM WFL, MMT grossly 4/5 throughout LUE Deficits /  Details: ROM WFL; MMT shoulder and elbow 4/5, hand/wrist 3/5 due to soreness and burising from fall   Lower Extremity Assessment Lower Extremity Assessment: Defer to PT evaluation   Cervical / Trunk Assessment Cervical / Trunk Assessment: Normal   Communication Communication Communication: No difficulties   Cognition Arousal/Alertness: Awake/alert Behavior During Therapy: Anxious Overall Cognitive Status: History of cognitive impairments - at baseline                                 General Comments: Pt frequently needs redirecting and focusing on task at hand.   General Comments  VSS on RA, pt with a mild trempr, agitation with spouse everytime he speaks.    Exercises     Shoulder Instructions      Home Living Family/patient expects to be discharged to:: Private residence Living Arrangements: Spouse/significant other Available Help at Discharge: Family;Available 24 hours/day Type of Home: House Home Access: Stairs to enter [employer] of Steps: 3 Entrance Stairs-Rails: Left Home Layout: Two level;Able to live on main level with bedroom/bathroom     Bathroom Shower/Tub: Occupational psychologist: Handicapped height Bathroom Accessibility: Yes How Accessible: Accessible via walker Home Equipment: [place] - 4 wheels;Bedside commode;Shower seat - built in;Hand held shower head;Wheelchair - manual          Prior Functioning/Environment Level of Independence: Needs assistance  Gait / Transfers Assistance Needed: Occasionally used cane or rollator; reports some unsteadiness -bumping into doors; if going ambulating in community uses rollator ADL's / Homemaking Assistance Needed: Reports decreased cooking b/c dropping things; performed ADLs on her own   Comments: Frequent falls and dizziness started about 4 weeks when she started a new meds for cognition        OT Problem List: Decreased strength;Decreased range of motion;Impaired balance  (sitting and/or standing);Decreased safety awareness;Decreased knowledge of use of DME or AE;Decreased cognition;Impaired UE functional use;Impaired sensation      OT Treatment/Interventions: Self-care/ADL training;Therapeutic exercise;Energy conservation;DME and/or AE instruction;Therapeutic activities;Cognitive remediation/compensation;Patient/family education;Balance training    OT Goals(Current goals can be found in the care plan section) Acute Rehab OT Goals Patient Stated Goal: return home OT Goal Formulation: With patient Time For Goal Achievement: 08/22/21 Potential to Achieve Goals: Good ADL Goals Pt Will Perform Grooming: with modified independence;standing Pt Will Perform Lower Body Bathing: with modified independence;sitting/lateral leans;sit to/from stand Pt Will Perform Lower Body Dressing: with modified independence;sitting/lateral leans;sit to/from stand Pt Will Transfer to Toilet: with modified independence;ambulating Pt Will Perform Toileting - Clothing Manipulation and hygiene: with modified independence;sitting/lateral leans;sit to/from stand Additional ADL Goal #1: Pt will use rollator 100% of the time to prevent further falls at home.  OT Frequency: Min 2X/week   Barriers to D/C:            Co-evaluation              AM-PAC OT "6 Clicks" Daily Activity     Outcome Measure Help from another person eating meals?: None Help from another person taking care of personal grooming?: A Little Help from another person toileting, which includes using toliet, bedpan, or urinal?: A Little Help from another person bathing (including washing, rinsing, drying)?: A Little Help from another person to put on and taking off regular upper body clothing?: None Help from another person to put on  and taking off regular lower body clothing?: A Little 6 Click Score: 20   End of Session Nurse Communication: Mobility status  Activity Tolerance: Patient tolerated treatment  well Patient left: in bed;with call bell/phone within reach;with family/visitor present  OT Visit Diagnosis: Unsteadiness on feet (R26.81);Other abnormalities of gait and mobility (R26.89);Muscle weakness (generalized) (M62.81);History of falling (Z91.81)                Time: 1610-9604 OT Time Calculation (min): 30 min Charges:  OT General Charges $OT Visit: 1 Visit OT Evaluation $OT Eval Moderate Complexity: 1 Mod OT Treatments $Self Care/Home Management : 8-22 mins  Uva Runkel H., OTR/L Acute Rehabilitation  Sascha Baugher Elane Brieanne Mignone 08/08/2021, 11:43 AM

## 2021-08-08 NOTE — Progress Notes (Signed)
SLP Cancellation Note  Patient Details Name: Marissa [name] MRN: [id number] DOB: July 27, 1946   Cancelled treatment:       Reason Eval/Treat Not Completed: Patient at procedure or test/unavailable (Pt was off unit at time of attempt. SLP will follow up.)  Danette Weinfeld I. Hardin Negus, [place], [place] Office number [phone] Pager [place] 08/08/2021, 3:51 PM

## 2021-08-08 NOTE — Progress Notes (Addendum)
STROKE TEAM PROGRESS NOTE   INTERVAL HISTORY Her husband is at the bedside.   Patient appears anxious, has difficulty focusing on exam, easily distracted but is pleasant and cooperative.  She presented with 3 to 4-day history of dizziness, nausea, vomiting and falls which she feels is related to starting new medication Namenda.  MRI scan of the brain shows right parietal embolic infarct which may be incidental.  MRA of the brain shows no large vessel stenosis and carotid ultrasound is unremarkable.  LDL cholesterol is 109 mg percent and hemoglobin A1c is 5.1. Vitals:   08/07/21 2330 08/08/21 0336 08/08/21 0736 08/08/21 1109  BP: 137/81 (!) 151/78 (!) 150/75 103/76  Pulse: (!) 58 (!) 55 (!) 57 74  Resp: 17 16 16 12   Temp: 98.6 F (37 C) 98.1 F (36.7 C) 98.2 F (36.8 C) 98.2 F (36.8 C)  TempSrc: Oral Oral Oral Oral  SpO2: 99% 98% 100% 100%  Weight:      Height:       CBC:  Recent Labs  Lab 08/03/21 1615 08/06/21 1455 08/06/21 2047  WBC 5.5 9.4  --   NEUTROABS 3.9 8.0*  --   HGB 11.9* 13.1 11.9*  HCT 34.9* 36.4 35.0*  MCV 89.3 85.8  --   PLT 192 219  --    Basic Metabolic Panel:  Recent Labs  Lab 08/03/21 1615 08/06/21 1455 08/06/21 1653 08/06/21 2047 08/07/21 0404 08/08/21 0322  NA 131*   < >  --    < > 131* 132*  K 3.5   < >  --    < > 3.2* 3.2*  CL 97*   < >  --   --  97* 96*  CO2 21*   < >  --   --  19* 25  GLUCOSE 164*   < >  --   --  84 90  BUN 14   < >  --   --  10 7*  CREATININE 0.96   < >  --   --  0.84 0.75  CALCIUM 9.5   < >  --   --  9.0 8.8*  MG 1.2*  --  1.2*  --  1.7  --   PHOS 2.3*  --   --   --   --   --    < > = values in this interval not displayed.    Lipid Panel:  Recent Labs  Lab 08/07/21 0404  CHOL 250*  TRIG 150*  HDL 111  CHOLHDL 2.3  VLDL 30  LDLCALC 109*    HgbA1c:  Recent Labs  Lab 08/07/21 0500  HGBA1C 5.1   Urine Drug Screen:  Recent Labs  Lab 08/07/21 0406  LABOPIA NONE DETECTED  COCAINSCRNUR NONE DETECTED   LABBENZ POSITIVE*  AMPHETMU NONE DETECTED  THCU NONE DETECTED  LABBARB NONE DETECTED    Alcohol Level  Recent Labs  Lab 08/06/21 1455  ETH <10    IMAGING past 24 hours ECHOCARDIOGRAM COMPLETE  Result Date: 08/08/2021    ECHOCARDIOGRAM REPORT   Patient Name:   [facility] Dahlia Bailiff Date of Exam: 08/08/2021 Medical Rec #:  [id number]       Height:       64.5 in Accession #:    [id number]      Weight:       132.0 lb Date of Birth:  [birth year]       BSA:  1.648 m Patient Age:    75 years        BP:           103/76 mmHg Patient Gender: F               HR:           62 bpm. Exam Location:  Inpatient Procedure: 2D Echo Indications:    stroke  History:        Patient has prior history of Echocardiogram examinations, most                 recent 08/15/2020. Risk Factors:Dyslipidemia.  Sonographer:    Lyman Referring Phys: [id number] [place]  1. Left ventricular ejection fraction, by estimation, is 60 to 65%. The left ventricle has normal function. The left ventricle has no regional wall motion abnormalities. Left ventricular diastolic parameters were normal.  2. Right ventricular systolic function is normal. The right ventricular size is normal. There is normal pulmonary artery systolic pressure.  3. The mitral valve is abnormal. Trivial mitral valve regurgitation. No evidence of mitral stenosis.  4. ? functoinally bicuspid with poor visualization of commisure between right and left cusp Non coronary cusp very sclerotic Gradients stable since TTE done 08/15/20. The aortic valve is tricuspid. There is moderate calcification of the aortic valve. Aortic valve regurgitation is not visualized. Mild aortic valve stenosis.  5. Aortic root stable since TTE done 08/15/20 . Aortic dilatation noted. There is moderate dilatation of the ascending aorta, measuring 42 mm.  6. The inferior vena cava is normal in size with greater than 50% respiratory variability, suggesting right atrial  pressure of 3 mmHg. FINDINGS  Left Ventricle: Left ventricular ejection fraction, by estimation, is 60 to 65%. The left ventricle has normal function. The left ventricle has no regional wall motion abnormalities. The left ventricular internal cavity size was normal in size. There is  no left ventricular hypertrophy. Left ventricular diastolic parameters were normal. Right Ventricle: The right ventricular size is normal. No increase in right ventricular wall thickness. Right ventricular systolic function is normal. There is normal pulmonary artery systolic pressure. The tricuspid regurgitant velocity is 2.38 m/s, and  with an assumed right atrial pressure of 3 mmHg, the estimated right ventricular systolic pressure is 38.4 mmHg. Left Atrium: Left atrial size was normal in size. Right Atrium: Right atrial size was normal in size. Pericardium: There is no evidence of pericardial effusion. Mitral Valve: The mitral valve is abnormal. There is mild thickening of the mitral valve leaflet(s). There is mild calcification of the mitral valve leaflet(s). Mild mitral annular calcification. Trivial mitral valve regurgitation. No evidence of mitral valve stenosis. Tricuspid Valve: The tricuspid valve is normal in structure. Tricuspid valve regurgitation is mild . No evidence of tricuspid stenosis. Aortic Valve: ? functoinally bicuspid with poor visualization of commisure between right and left cusp Non coronary cusp very sclerotic Gradients stable since TTE done 08/15/20. The aortic valve is tricuspid. There is moderate calcification of the aortic  valve. Aortic valve regurgitation is not visualized. Mild aortic stenosis is present. Aortic valve mean gradient measures 10.0 mmHg. Aortic valve peak gradient measures 18.7 mmHg. Aortic valve area, by VTI measures 1.35 cm. Pulmonic Valve: The pulmonic valve was normal in structure. Pulmonic valve regurgitation is not visualized. No evidence of pulmonic stenosis. Aorta: Aortic root  stable since TTE done 08/15/20. The aortic root is normal in size and structure and aortic dilatation noted. There is moderate  dilatation of the ascending aorta, measuring 42 mm. Venous: The inferior vena cava is normal in size with greater than 50% respiratory variability, suggesting right atrial pressure of 3 mmHg. IAS/Shunts: No atrial level shunt detected by color flow Doppler.  LEFT VENTRICLE PLAX 2D LVIDd:         3.60 cm   Diastology LVIDs:         2.30 cm   LV e' medial:    7.40 cm/s LV PW:         1.00 cm   LV E/e' medial:  10.0 LV IVS:        1.00 cm   LV e' lateral:   8.81 cm/s LVOT diam:     1.90 cm   LV E/e' lateral: 8.4 LV SV:         62 LV SV Index:   38 LVOT Area:     2.84 cm  RIGHT VENTRICLE             IVC RV S prime:     13.40 cm/s  IVC diam: 1.10 cm TAPSE (M-mode): 1.8 cm LEFT ATRIUM             Index        RIGHT ATRIUM           Index LA diam:        3.40 cm 2.06 cm/m   RA Area:     10.40 cm LA Vol (A2C):   43.5 ml 26.39 ml/m  RA Volume:   18.80 ml  11.40 ml/m LA Vol (A4C):   38.9 ml 23.60 ml/m LA Biplane Vol: 42.9 ml 26.02 ml/m  AORTIC VALVE AV Area (Vmax):    1.33 cm AV Area (Vmean):   1.29 cm AV Area (VTI):     1.35 cm AV Vmax:           216.00 cm/s AV Vmean:          140.000 cm/s AV VTI:            0.460 m AV Peak Grad:      18.7 mmHg AV Mean Grad:      10.0 mmHg LVOT Vmax:         101.00 cm/s LVOT Vmean:        63.600 cm/s LVOT VTI:          0.219 m LVOT/AV VTI ratio: 0.48  AORTA Ao Root diam: 3.10 cm Ao Asc diam:  4.20 cm MITRAL VALVE               TRICUSPID VALVE MV Area (PHT): 3.06 cm    TR Peak grad:   22.7 mmHg MV Decel Time: 248 msec    TR Vmax:        238.00 cm/s MV E velocity: 73.70 cm/s MV A velocity: 75.80 cm/s  SHUNTS MV E/A ratio:  0.97        Systemic VTI:  0.22 m                            Systemic Diam: 1.90 cm Jenkins Rouge MD Electronically signed by Jenkins Rouge MD Signature Date/Time: 08/08/2021/12:23:21 PM    Final     PHYSICAL EXAM Frail elderly Caucasian  lady who appears quite anxious. . Afebrile. Head is nontraumatic. Neck is supple without bruit.    Cardiac exam no murmur or gallop. Lungs are clear to auscultation. Distal pulses are well felt.  NEURO:  Mental Status: Alert and oriented x 4. anxious and easily distracted but easily reoriented.  Appears anxious Speech/Language: speech is without dysarthria or aphasia.  Naming, repetition, fluency, and comprehension intact.   Cranial Nerves:  II: PERRL. visual fields full.  III, IV, VI: EOMI. Lid elevation symmetric and full.  V: sensation is intact and symmetrical to face.  VII: Smile is symmetrical.   VIII:hearing intact to voice. IX, X: palate elevation is symmetric. Phonation normal.  XI: normal sternocleidomastoid and trapezius muscle strength. YFV:CBSWHQ is symmetrical without fasciculations.   Motor:  5/5 to all extremities.  Tone is normal. Bulk is normal.  Sensation- Intact to light touch bilaterally in all four extremities.  Coordination: FTN intact bilaterally.   Gait- deferred.    ASSESSMENT/PLAN Ms. Keora Eccleston is a 75 y.o. female with history of  cognitive impairment, anxiety, depression, HTN, chronic fatigue immune dysfunction syndrome,and HLD. Patient presented to the ED on 08/06/21 with dizziness and falls x a few weeks. It was suspected that the [employer] she just started on might be causing her presentation. Per husband, she has been on 3 dementia medications and was unable to tolerate any of them. [place] is being held. Patient has no personal of FMHx of stroke.    Per husband, beginning 4 days ago, patient was more wobbly with gait and using the walker more. Also, dizzy with n/v, and nightmares. She came to the ED then, but did not wait for workup.  She stopped taking the Namenda that Friday, but over the weekend, she was still having dizziness and n/v. Today, she fell twice. #! [place] and husband told her to lie there, then #2, she got herself up and fell again. She  was brought to the hospital for evaluation.   MRI  brain revealed incidental finding of subacute stroke in right parietal area. MRA head showed no LVO or significant stenosis. Carotid US is negative for stenosis. LDL of 109. HgbA1c 5.1%.     Per husband at bedside, patient sees out patient neurologist who has been managing her cognitive medications. Her last MOCA score was 17/30. Last visit with subjective c/o tired a lot, inability to concentrate, and difficulty with word finding.3/22 office visit showed patient was no longer driving, cooking, or taking care of finances. She is able to still do her own bathing, toileting, dressing, and ADLs. EEG showed temporal slowing and MRI brain was without acute findings, but showed diffuse atrophy and chronic microvascular disease.       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 Chronic small-vessel ischemic changes of the white matter. Small right parietal stroke that could be subacute or old, but was not present at the time of MRI done in February of this year.  MRI BRAIN:  Small acute to subacute cortical and subcortical infarction in the right parietal region, correlating with the CT abnormality.   Moderate chronic small-vessel ischemic changes elsewhere.   MRA HEAD:   negative for large vessel occlusion or correctable proximal stenosis. Mild atherosclerotic irregularity of the more distal intracranial branch vessels. Carotid ultrasound no significant bilateral extracranial stenosis 2D Echo LVEF 60-65%, LA size normal, no IA shunt LDL 109 HgbA1c 5.1 VTE prophylaxis - scd    Diet   Diet Heart Room service appropriate? Yes; Fluid consistency: Thin   aspirin 81 mg daily prior to admission, now on aspirin 81 mg daily and clopidogrel 75 mg daily.   Therapy recommendations:  home OT Disposition:  pending Plan to place loop recorder   Hypertension Home meds:  amlodipine,  clonidine Stable Permissive hypertension (OK if < 220/120) but gradually normalize in 5-7 days Long-term BP goal normotensive  Hyperlipidemia Home meds:  zetia resumed in hospital LDL 109, goal < 70 Continue statin at discharge  HgbA1c 5.1, goal < 7.0 CBGs No results for input(s): GLUCAP in the last 72 hours.  SSI  Other Stroke Risk Factors  Advanced Age >/= 66   Other Active Problems  Elevated lactate -No clear etiology, she is no tachycardia her BP stable.  UA pending, no treatment.   Metabolic acidosis -She is over undulating, suspect corresponding respiratory alkalosis, check VBG for pH.   Nausea and vomiting -Improved after discontinue memantine.  Correlated with the feeling of dizziness.  Abdominal exam benign.  Ordered abdomen x-ray.   COPD/asthma -Stable, check VBG for over ventilating and possible respiratory alkalosis.   Hypothyroidism -Check TSH   HTN -Continue beta-blocker.   Chronic rib fracture and chronic chest pain -Lidocaine patch.  Hospital day # 1   I have personally obtained history,examined this patient, reviewed notes, independently viewed imaging studies, participated in medical decision making and plan of care.ROS completed by me personally and pertinent positives fully documented  I have made any additions or clarifications directly to the above note. Agree with note above.  Patient presented with symptoms of dizziness, nausea vomiting and falls which may be related to starting a new medication Namenda and I agree with her to discontinue it.  She has no focal stroke related symptoms but MRI does show an embolic right parietal infarct.  Neurovascular imaging and echocardiogram unremarkable.  I recommend loop recorder to check for paroxysmal A. fib.  Dual antiplatelet therapy aspirin and Plavix for 3 weeks followed by aspirin alone.  Aggressive risk factor modification.  Agree with discontinuing Namenda which may be causing side effects.  Follow-up  with outpatient neurologist Dr. Charmayne Sheer h for further advice about Long discussion with patient and husband and answered questions.  Discussed with Dr. Koleen Nimrod.  Greater than 50% time during this 35-minute visit was spent in counseling and coordination of care with embolic stroke and answering questions.  Antony Contras, MD Medical Director [facility] Pager: [phone] 08/08/2021 3:52 PM   To contact Stroke Continuity provider, please refer to [email or web address] After hours, contact General Neurology

## 2021-08-08 NOTE — Plan of Care (Signed)
Pt is alert oriented x 4. Ambulatory with +1 assist.  NIH completed per order-NIH 0. Pt c/o heart burn, pain, burning, when swallowing anything in the middle of chest. On call provider paged to inform received order for protonix. Pt continued to c/o of pain and burning when swallowing pills, water etc. On call provider paged to inform. Received order for GI cocktail. Pt stated the was effective.    Problem: Education: Goal: Knowledge of secondary prevention will improve Outcome: Progressing   Problem: Coping: Goal: Will verbalize positive feelings about self Outcome: Progressing   Problem: Self-Care: Goal: Ability to participate in self-care as condition permits will improve Outcome: Progressing

## 2021-08-08 NOTE — Progress Notes (Signed)
Physical Therapy Treatment Patient Details Name: Marissa [name] MRN: [id number] DOB: Jun 19, 1946 Today's Date: 08/08/2021   History of Present Illness Pt is 75 yo female who presented with repeated falls, dizziness, nightmares, anxiety on 08/06/21.  Pt had recently started Namenda for cognitive impairments and then symptoms began. Pt also found to have acute/subacute R parietal CVA.  Namenda has been discontinued.  Pt with hx of mild cognitive impairment, asthma, anxiety/depression, HTN, HLD    PT Comments    Patient progressing with activity tolerance despite rough morning with nausea and vomiting.  She fatigued with ambulation, but after seated rest performed standing counter exercise prior to return to bed.  Left in supine as planned transport for abdominal CT scan.  Patient appropriate for HHPT at d/c.  Spouse questioning regarding respite care and suggested case management for list of agencies.  She may benefit from [facility] for bath as well.  PT will follow.    Recommendations for follow up therapy are one component of a multi-disciplinary discharge planning process, led by the attending physician.  Recommendations may be updated based on patient status, additional functional criteria and insurance authorization.  Follow Up Recommendations  Home health PT;Supervision for mobility/OOB [facility] aide)     Equipment Recommendations  None recommended by PT    Recommendations for Other Services       Precautions / Restrictions Precautions Precautions: Fall     Mobility  Bed Mobility Overal bed mobility: Needs Assistance       Supine to sit: Min guard Sit to supine: Min guard   General bed mobility comments: increased time, min guard for safety    Transfers Overall transfer level: Needs assistance Equipment used: Rolling walker (2 wheeled) Transfers: Sit to/from Stand Sit to Stand: Min guard         General transfer comment: assist for  balance  Ambulation/Gait Ambulation/Gait assistance: Min guard;Min assist Gait Distance (Feet): 120 Feet Assistive device: Rolling walker (2 wheeled) Gait Pattern/deviations: Step-through pattern;Decreased stride length;Shuffle     General Gait Details: slow and limited by fatigue, assist for safety with RW   Stairs             Wheelchair Mobility    Modified [place] (Stroke Patients Only) Modified [place] (Stroke Patients Only) Pre-Morbid [place]ley Score: Moderate disability Modified [place]: Moderately severe disability     Balance Overall balance assessment: Needs assistance Sitting-balance support: Feet supported Sitting balance-Leahy Scale: Good     Standing balance support: Bilateral upper extremity supported Standing balance-Leahy Scale: Poor Standing balance comment: UE support in standing                            Cognition Arousal/Alertness: Awake/alert Behavior During Therapy: Anxious Overall Cognitive Status: History of cognitive impairments - at baseline                                        Exercises General Exercises - Lower Extremity Hip ABduction/ADduction: Strengthening;Both;10 reps;Standing Heel Raises: Both;10 reps;Strengthening;Standing Mini-Sqauts: Strengthening;10 reps;Standing    General Comments General comments (skin integrity, edema, etc.): VSS spouse in the room and supportive      Pertinent Vitals/Pain Pain Assessment: Faces Faces Pain Scale: Hurts little more Pain Location: abdomen Pain Descriptors / Indicators: Aching;Guarding Pain Intervention(s): Monitored during session;Repositioned    Home Living  Prior Function            PT Goals (current goals can now be found in the care plan section) Progress towards PT goals: Progressing toward goals    Frequency    Min 4X/week      PT Plan Current plan remains appropriate    Co-evaluation               AM-PAC PT "6 Clicks" Mobility   Outcome Measure  Help needed turning from your back to your side while in a flat bed without using bedrails?: A Little Help needed moving from lying on your back to sitting on the side of a flat bed without using bedrails?: A Little Help needed moving to and from a bed to a chair (including a wheelchair)?: A Little Help needed standing up from a chair using your arms (e.g., wheelchair or bedside chair)?: A Little Help needed to walk in hospital room?: A Little Help needed climbing 3-5 steps with a railing? : A Little 6 Click Score: 18    End of Session   Activity Tolerance: Patient tolerated treatment well Patient left: in bed;with call bell/phone within reach;with family/visitor present   PT Visit Diagnosis: Other abnormalities of gait and mobility (R26.89);History of falling (Z91.81);Muscle weakness (generalized) (M62.81)     Time: 8315-1761 PT Time Calculation (min) (ACUTE ONLY): 25 min  Charges:  $Gait Training: 8-22 mins $Therapeutic Exercise: 8-22 mins                     Magda Kiel, PT Acute Rehabilitation Services YWVPX:[phone] Office:[phone] 08/08/2021    Reginia Naas 08/08/2021, 3:18 PM

## 2021-08-08 NOTE — Progress Notes (Signed)
  Echocardiogram 2D Echocardiogram has been performed.  Marissa [name] 08/08/2021, 12:00 PM

## 2021-08-09 ENCOUNTER — Encounter (HOSPITAL_COMMUNITY)
Admission: EM | Disposition: A | Discharge status: Home / Self Care | Payer: Self-pay | Source: Home / Self Care | Attending: Student in an Organized Health Care Education/Training Program

## 2021-08-09 DIAGNOSIS — W19XXXA Unspecified fall, initial encounter: Secondary | ICD-10-CM | POA: Diagnosis not present

## 2021-08-09 DIAGNOSIS — Y92009 Unspecified place in unspecified non-institutional (private) residence as the place of occurrence of the external cause: Secondary | ICD-10-CM | POA: Diagnosis not present

## 2021-08-09 DIAGNOSIS — R112 Nausea with vomiting, unspecified: Secondary | ICD-10-CM | POA: Diagnosis not present

## 2021-08-09 DIAGNOSIS — I639 Cerebral infarction, unspecified: Secondary | ICD-10-CM | POA: Diagnosis not present

## 2021-08-09 HISTORY — PX: LOOP RECORDER INSERTION: EP1214

## 2021-08-09 LAB — COMPREHENSIVE METABOLIC PANEL
ALT: 15 U/L (ref 0–44)
AST: 24 U/L (ref 15–41)
Albumin: 3.4 g/dL — ABNORMAL LOW (ref 3.5–5.0)
Alkaline Phosphatase: 41 U/L (ref 38–126)
Anion gap: 11 (ref 5–15)
BUN: <5 mg/dL — ABNORMAL LOW (ref 8–23)
CO2: 23 mmol/L (ref 22–32)
Calcium: 8.5 mg/dL — ABNORMAL LOW (ref 8.9–10.3)
Chloride: 97 mmol/L — ABNORMAL LOW (ref 98–111)
Creatinine, Ser: 0.7 mg/dL (ref 0.44–1.00)
GFR, Estimated: >60 mL/min (ref >60)
Glucose, Bld: 94 mg/dL (ref 70–99)
Potassium: 3 mmol/L — ABNORMAL LOW (ref 3.5–5.1)
Sodium: 131 mmol/L — ABNORMAL LOW (ref 135–145)
Total Bilirubin: 1.1 mg/dL (ref 0.3–1.2)
Total Protein: 5.6 g/dL — ABNORMAL LOW (ref 6.5–8.1)

## 2021-08-09 LAB — CBC
HCT: 31.7 % — ABNORMAL LOW (ref 36.0–46.0)
Hemoglobin: 11 g/dL — ABNORMAL LOW (ref 12.0–15.0)
MCH: 30.6 pg (ref 26.0–34.0)
MCHC: 34.7 g/dL (ref 30.0–36.0)
MCV: 88.3 fL (ref 80.0–100.0)
Platelets: 163 10*3/uL (ref 150–400)
RBC: 3.59 MIL/uL — ABNORMAL LOW (ref 3.87–5.11)
RDW: 13.8 % (ref 11.5–15.5)
WBC: 4.9 10*3/uL (ref 4.0–10.5)
nRBC: 0 % (ref 0.0–0.2)

## 2021-08-09 LAB — TSH: TSH: 3.084 u[IU]/mL (ref 0.350–4.500)

## 2021-08-09 SURGERY — LOOP RECORDER INSERTION

## 2021-08-09 MED ORDER — INFLUENZA VAC A&B SA ADJ QUAD 0.5 ML IM PRSY: 0.5000 mL | PREFILLED_SYRINGE | INTRAMUSCULAR | 0 refills | Status: AC

## 2021-08-09 MED ORDER — LIDOCAINE-EPINEPHRINE 1 %-1:100000 IJ SOLN
INTRAMUSCULAR | Status: AC
  Filled 2021-08-09: qty 1

## 2021-08-09 MED ORDER — CLOPIDOGREL BISULFATE 75 MG PO TABS: 75.0000 mg | ORAL_TABLET | Freq: Every day | ORAL | 0 refills | Status: DC

## 2021-08-09 MED ORDER — ASPIRIN EC 81 MG PO TBEC: 81.0000 mg | DELAYED_RELEASE_TABLET | Freq: Every day | ORAL | 0 refills | Status: DC

## 2021-08-09 MED ORDER — LIDOCAINE-EPINEPHRINE 1 %-1:100000 IJ SOLN
INTRAMUSCULAR | Status: DC | PRN
  Administered 2021-08-09: 20 mL

## 2021-08-09 SURGICAL SUPPLY — 2 items
MONITOR MOBILE MNGR LINQ22 (Prosthesis & Implant Heart) ×2 IMPLANT
PACK LOOP INSERTION (CUSTOM PROCEDURE TRAY) ×2 IMPLANT

## 2021-08-09 NOTE — Progress Notes (Signed)
Discharge instructions gone over with patient and husband at bedside. All questions answered.  IV removed from forearm, no telemetry to d/c. Patient transported off unit in wheelchair to be taken home by husband.  Gwendolyn Grant, RN

## 2021-08-09 NOTE — Progress Notes (Signed)
Physical Therapy Treatment Patient Details Name: Marissa [name] MRN: [id number] DOB: 08/20/1946 Today's Date: 08/09/2021   History of Present Illness Pt is 75 yo female who presented with repeated falls, dizziness, nightmares, anxiety on 08/06/21.  Pt had recently started Namenda for cognitive impairments and then symptoms began. Pt also found to have acute/subacute R parietal CVA.  Namenda has been discontinued. Pt with hx of mild cognitive impairment, asthma, anxiety/depression, HTN, HLD    PT Comments    Pt received in supine, agreeable to therapy session and with good participation in gait and stair training with spouse present for caregiver instruction. Pt needing min guard at most for safety with transfers/gait and stairs with single handrail, pt spouse able to demonstrate safe guarding positions when assisting pt with gait and stairs. Pt mildly impulsive/unsafe with RW and transfers and needed frequent safety cues, pt spouse instructed to provide 24/7 assist due to pt cognitive deficit. Gait belt given to pt/spouse for safety. Pt continues to benefit from PT services to progress toward functional mobility goals.   Recommendations for follow up therapy are one component of a multi-disciplinary discharge planning process, led by the attending physician.  Recommendations may be updated based on patient status, additional functional criteria and insurance authorization.  Follow Up Recommendations  Home health PT;Supervision for mobility/OOB [facility] aide)     Equipment Recommendations  Other (comment);None recommended by PT (gait belt obtained for pt)    Recommendations for Other Services       Precautions / Restrictions Precautions Precautions: Fall Precaution Comments: pt mild impulsivity Restrictions Weight Bearing Restrictions: No     Mobility  Bed Mobility Overal bed mobility: Needs Assistance Bed Mobility: Sit to Supine     Supine to sit: Min guard Sit to supine:  Supervision   General bed mobility comments: min cues for safety, HOB ~20 deg. Pt sat up with assist from spouse when PTA left room to get face mask, reinforced with her need to wait until staff present for safety.    Transfers Overall transfer level: Needs assistance Equipment used: Rolling walker (2 wheeled) Transfers: Sit to/from Stand Sit to Stand: Min guard         General transfer comment: cues for hand placement with poor carryover, needs reinforcement each sit<>stand with teachback method to reinforce. no LOB standing from EOB<>RW but min guard for safety due to pt impulsivity.  Ambulation/Gait Ambulation/Gait assistance: Min guard;Supervision Gait Distance (Feet): 230 Feet Assistive device: Rolling walker (2 wheeled) Gait Pattern/deviations: Step-through pattern;Decreased stride length;Drifts right/left Gait velocity: decreased   General Gait Details: pt with good posture but unsafe, tending to lift RUE off RW handles when speaking/gesturing, pt needs frequent cues to keep both hands on RW and min guard for safety at most due to this issue. reinforced with spouse importance of RW proximity to pt and obtained gait belt for pt to take home to reduce fall risk.   Stairs Stairs: Yes Stairs assistance: Min guard Stair Management: One rail Left;Forwards;Step to pattern Number of Stairs: 4 General stair comments: pt ascended/descended two 7" steps in PT gym x2 trials, first trial with PTA providing cues/min guard and second trial with spouse guarding with caregiver instruction provided. pt safe throughout no LOB and good following of step sequencing/safety cues.   Wheelchair Mobility    Modified [place] (Stroke Patients Only) Modified [place] (Stroke Patients Only) Pre-Morbid [place] Score: Moderate disability Modified [place]: Moderately severe disability     Balance Overall balance assessment: Needs assistance  Sitting-balance support: Feet supported Sitting balance-Leahy  Scale: Good     Standing balance support: Bilateral upper extremity supported Standing balance-Leahy Scale: Poor Standing balance comment: pt able to stand with U UE support but safer with BUE support of RW for dynamic balance tasks                            Cognition Arousal/Alertness: Awake/alert Behavior During Therapy: Anxious;Impulsive Overall Cognitive Status: History of cognitive impairments - at baseline                                 General Comments: PTA went to get mask for her, spouse in room and pt still in supine. When therapist got back to room, bed alarm sounding and pt attempting to stand to don back gown. Pt/spouse reinforced on importance of waiting for staff to be present for safety with OOB tasks.      Exercises      General Comments General comments (skin integrity, edema, etc.): no acute s/sx distress throughout      Pertinent Vitals/Pain Pain Assessment: Faces Faces Pain Scale: Hurts a little bit Pain Location: abdomen Pain Descriptors / Indicators: Discomfort;Aching Pain Intervention(s): Monitored during session;Premedicated before session;Repositioned    Home Living     Available Help at Discharge: Family;Available 24 hours/day Type of Home: House              Prior Function            PT Goals (current goals can now be found in the care plan section) Acute Rehab PT Goals PT Goal Formulation: With patient/family Time For Goal Achievement: 08/21/21 Progress towards PT goals: Progressing toward goals    Frequency    Min 4X/week      PT Plan Current plan remains appropriate    Co-evaluation              AM-PAC PT "6 Clicks" Mobility   Outcome Measure  Help needed turning from your back to your side while in a flat bed without using bedrails?: None Help needed moving from lying on your back to sitting on the side of a flat bed without using bedrails?: A Little Help needed moving to and from a  bed to a chair (including a wheelchair)?: A Little Help needed standing up from a chair using your arms (e.g., wheelchair or bedside chair)?: A Little Help needed to walk in hospital room?: A Little Help needed climbing 3-5 steps with a railing? : A Little 6 Click Score: 19    End of Session Equipment Utilized During Treatment: Gait belt Activity Tolerance: Patient tolerated treatment well Patient left: in bed;with call bell/phone within reach;with bed alarm set;with family/visitor present;Other (comment) (bed in chair position for her to finish her lunch) Nurse Communication: Mobility status PT Visit Diagnosis: Other abnormalities of gait and mobility (R26.89);History of falling (Z91.81);Muscle weakness (generalized) (M62.81)     Time: 4462-8638 PT Time Calculation (min) (ACUTE ONLY): 20 min  Charges:  $Gait Training: 8-22 mins                     Birtie Fellman P., PTA Acute Rehabilitation Services Pager: [phone] Office: [place] 08/09/2021, 1:38 PM

## 2021-08-09 NOTE — Progress Notes (Signed)
STROKE TEAM PROGRESS NOTE   INTERVAL HISTORY Patient is alone in the room.  She states she is doing a whole lot better.  She is awaiting loop recorder prior to discharge home.  She has no complaints.  Vital signs stable.  Neuro exam unchanged. Vitals:   08/03/21 1607 08/03/21 1625  BP: (!) 170/76   Pulse: 68   Resp: 20   Temp: 98.9 F (37.2 C)   TempSrc: Oral   SpO2: 100%   Weight:  60.8 kg  Height:  5' 4"  (1.626 m)   CBC:  Recent Labs  Lab 08/03/21 1615 08/06/21 1455 08/06/21 2047 08/09/21 0336  WBC 5.5 9.4  --  4.9  NEUTROABS 3.9 8.0*  --   --   HGB 11.9* 13.1 11.9* 11.0*  HCT 34.9* 36.4 35.0* 31.7*  MCV 89.3 85.8  --  88.3  PLT 192 219  --  258   Basic Metabolic Panel:  Recent Labs  Lab 08/03/21 1615 08/06/21 1455 08/06/21 1653 08/06/21 2047 08/07/21 0404 08/08/21 0322 08/09/21 0336  NA 131*   < >  --    < > 131* 132* 131*  K 3.5   < >  --    < > 3.2* 3.2* 3.0*  CL 97*   < >  --   --  97* 96* 97*  CO2 21*   < >  --   --  19* 25 23  GLUCOSE 164*   < >  --   --  84 90 94  BUN 14   < >  --   --  10 7* <5*  CREATININE 0.96   < >  --   --  0.84 0.75 0.70  CALCIUM 9.5   < >  --   --  9.0 8.8* 8.5*  MG 1.2*  --  1.2*  --  1.7  --   --   PHOS 2.3*  --   --   --   --   --   --    < > = values in this interval not displayed.    Lipid Panel:  Recent Labs  Lab 08/07/21 0404  CHOL 250*  TRIG 150*  HDL 111  CHOLHDL 2.3  VLDL 30  LDLCALC 109*    HgbA1c:  Recent Labs  Lab 08/07/21 0500  HGBA1C 5.1   Urine Drug Screen:  Recent Labs  Lab 08/07/21 0406  LABOPIA NONE DETECTED  COCAINSCRNUR NONE DETECTED  LABBENZ POSITIVE*  AMPHETMU NONE DETECTED  THCU NONE DETECTED  LABBARB NONE DETECTED    Alcohol Level  Recent Labs  Lab 08/06/21 1455  ETH <10    IMAGING past 24 hours CT ABDOMEN PELVIS W CONTRAST  Result Date: 08/08/2021 CLINICAL DATA:  [place], nausea and vomiting, weakness, abdominal pain EXAM: CT ABDOMEN AND PELVIS WITH CONTRAST TECHNIQUE:  Multidetector CT imaging of the abdomen and pelvis was performed using the standard protocol following bolus administration of intravenous contrast. CONTRAST:  153m OMNIPAQUE IOHEXOL 300 MG/ML  SOLN COMPARISON:  08/06/2021 FINDINGS: Lower chest: No acute pleural or parenchymal lung disease. Hepatobiliary: No focal liver abnormality is seen. No gallstones, gallbladder wall thickening, or biliary dilatation. Pancreas: Unremarkable. No pancreatic ductal dilatation or surrounding inflammatory changes. Spleen: Normal in size without focal abnormality. Adrenals/Urinary Tract: Adrenal glands are unremarkable. Kidneys are normal, without renal calculi, focal lesion, or hydronephrosis. Bladder is unremarkable. Stomach/Bowel: No bowel obstruction or ileus. Normal appendix right lower quadrant. No bowel wall thickening or inflammatory change. Vascular/Lymphatic:  Aortic atherosclerosis. No enlarged abdominal or pelvic lymph nodes. Reproductive: Status post hysterectomy. No adnexal masses. Other: No free fluid or free gas.  No abdominal wall hernia. Musculoskeletal: No acute or destructive bony lesions. Multiple chronic left rib fractures. Extensive lower lumbar spondylosis and facet hypertrophy. Reconstructed images demonstrate no additional findings. IMPRESSION: 1. No acute intra-abdominal or intrapelvic process. No evidence of trauma. 2.  Aortic Atherosclerosis (ICD10-I70.0). Electronically Signed   By: Randa Ngo M.D.   On: 08/08/2021 15:52    PHYSICAL EXAM Frail elderly Caucasian lady who is not in distress. . Afebrile. Head is nontraumatic. Neck is supple without bruit.    Cardiac exam no murmur or gallop. Lungs are clear to auscultation. Distal pulses are well felt.   NEURO:  Mental Status: Alert and oriented x 4. anxious and easily distracted but easily reoriented.  Speech/Language: speech is without dysarthria or aphasia.  Naming, repetition, fluency, and comprehension intact.   Cranial Nerves:  II: PERRL.  visual fields full.  III, IV, VI: EOMI. Lid elevation symmetric and full.  V: sensation is intact and symmetrical to face.  VII: Smile is symmetrical.   VIII:hearing intact to voice. IX, X: palate elevation is symmetric. Phonation normal.  XI: normal sternocleidomastoid and trapezius muscle strength. WYO:VZCHYI is symmetrical without fasciculations.   Motor:  5/5 to all extremities.  Tone is normal. Bulk is normal.  Sensation- Intact to light touch bilaterally in all four extremities.  Coordination: FTN intact bilaterally.   Gait- deferred.    ASSESSMENT/PLAN Ms. Marissa [name] is a 75 y.o. female with history of  cognitive impairment, anxiety, depression, HTN, chronic fatigue immune dysfunction syndrome,and HLD. Patient presented to the ED on 08/06/21 with dizziness and falls x a few weeks. It was suspected that the [place] she just started on might be causing her presentation. Per husband, she has been on 3 dementia medications and was unable to tolerate any of them. [place] is being held. Patient has no personal of FMHx of stroke.    Per husband, beginning 4 days ago, patient was more wobbly with gait and using the walker more. Also, dizzy with n/v, and nightmares. She came to the ED then, but did not wait for workup.  She stopped taking the Namenda that Friday, but over the weekend, she was still having dizziness and n/v. Today, she fell twice. #! [place] and husband told her to lie there, then #2, she got herself up and fell again. She was brought to the hospital for evaluation.   MRI  brain revealed incidental finding of subacute stroke in right parietal area. MRA head showed no LVO or significant stenosis. Carotid US is negative for stenosis. LDL of 109. HgbA1c 5.1%.     Per husband at bedside, patient sees out patient neurologist who has been managing her cognitive medications. Her last MOCA score was 17/30. Last visit with subjective c/o tired a lot, inability to concentrate, and  difficulty with word finding.3/22 office visit showed patient was no longer driving, cooking, or taking care of finances. She is able to still do her own bathing, toileting, dressing, and ADLs. EEG showed temporal slowing and MRI brain was without acute findings, but showed diffuse atrophy and chronic microvascular disease.       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 Chronic small-vessel ischemic changes of the white matter. Small right parietal stroke that could be subacute or old, but was not present at the  time of MRI done in February of this year.  MRI BRAIN:  Small acute to subacute cortical and subcortical infarction in the right parietal region, correlating with the CT abnormality.   Moderate chronic small-vessel ischemic changes elsewhere.   MRA HEAD:   negative for large vessel occlusion or correctable proximal stenosis. Mild atherosclerotic irregularity of the more distal intracranial branch vessels. Carotid ultrasound no significant bilateral extracranial stenosis 2D Echo LVEF 60-65%, LA size normal, no IA shunt LDL 109 HgbA1c 5.1 VTE prophylaxis - scd    Diet   Diet Heart Room service appropriate? Yes; Fluid consistency: Thin   aspirin 81 mg daily prior to admission, now on aspirin 81 mg daily and clopidogrel 75 mg daily.   Therapy recommendations:  home OT Disposition:  pending Plan to place loop recorder   Hypertension Home meds:  amlodipine, clonidine Stable Permissive hypertension (OK if < 220/120) but gradually normalize in 5-7 days Long-term BP goal normotensive  Hyperlipidemia Home meds:  zetia resumed in hospital LDL 109, goal < 70 Continue statin at discharge  HgbA1c 5.1, goal < 7.0 CBGs No results for input(s): GLUCAP in the last 72 hours.  SSI  Other Stroke Risk Factors  Advanced Age >/= 49   Other Active Problems  Elevated lactate -No clear etiology, she is no tachycardia her BP  stable.  UA pending, no treatment.   Metabolic acidosis -She is over undulating, suspect corresponding respiratory alkalosis, check VBG for pH.   Nausea and vomiting -Improved after discontinue memantine.  Correlated with the feeling of dizziness.  Abdominal exam benign.  Ordered abdomen x-ray.   COPD/asthma -Stable, check VBG for over ventilating and possible respiratory alkalosis.   Hypothyroidism -Check TSH   HTN -Continue beta-blocker.   Chronic rib fracture and chronic chest pain -Lidocaine patch.  Hospital day # 0   Patient presented with symptoms of dizziness, nausea vomiting and falls which may be related to starting a new medication Namenda and I agree with her to discontinue it.  She has no focal stroke related symptoms but MRI does show an embolic right parietal infarct.  Neurovascular imaging and echocardiogram unremarkable.  I recommend loop recorder to check for paroxysmal A. fib.  Dual antiplatelet therapy aspirin and Plavix for 3 weeks followed by aspirin alone.  Aggressive risk factor modification.  Agree with discontinuing Namenda which may be causing side effects.  Discharge home later today after loop recorder insertion.  Discussed with Hortencia Conradi nurse practitioner.  Follow-up with outpatient neurologist Dr. Charmayne Sheer h for further advice about Long discussion with patient and husband and answered questions.     Greater than 50% time during this 25-minute visit was spent in counseling and coordination of care with embolic stroke and answering questions.  Stroke team will sign off.  Kindly call for questions.  Antony Contras, MD Medical Director [facility] Pager: [phone] 08/09/2021 1:46 PM   To contact Stroke Continuity provider, please refer to [email or web address] After hours, contact General Neurology

## 2021-08-09 NOTE — Consult Note (Signed)
ELECTROPHYSIOLOGY CONSULT NOTE  Patient ID: Marissa [name] MRN: [id number], DOB/AGE: July 17, 1946   Admit date: 08/06/2021 Date of Consult: 08/09/2021  Primary Physician: [name], MD Primary Cardiologist: Dr. Ellyn Hack Reason for Consultation: Cryptogenic stroke ; recommendations regarding Implantable Loop Recorder, requested by Dr. [name]  History of Present Illness Marissa [name] was admitted on 08/06/2021 with a few days of dizziness, nausea, vomiting.falls the pt initially that was 2/2 new start of namenda, found with stroke.    PMHx includes: HTN, HLD, chronic fatigue syndrome, anxiety/depression, dementia  Neurology notes: right parietal infarct embolic secondary to cryptogenic source.  she has undergone workup for stroke including echocardiogram and carotid dopplers.  The patient has been monitored on telemetry which has demonstrated sinus rhythm with no arrhythmias.  Neurology has deferred TEE   Echocardiogram this admission demonstrated .   IMPRESSIONS   1. Left ventricular ejection fraction, by estimation, is 60 to 65%. The  left ventricle has normal function. The left ventricle has no regional  wall motion abnormalities. Left ventricular diastolic parameters were  normal.   2. Right ventricular systolic function is normal. The right ventricular  size is normal. There is normal pulmonary artery systolic pressure.   3. The mitral valve is abnormal. Trivial mitral valve regurgitation. No  evidence of mitral stenosis.   4. ? functoinally bicuspid with poor visualization of commisure between  right and left cusp Non coronary cusp very sclerotic Gradients stable  since TTE done 08/15/20. The aortic valve is tricuspid. There is moderate  calcification of the aortic valve.  Aortic valve regurgitation is not visualized. Mild aortic valve stenosis.   5. Aortic root stable since TTE done 08/15/20 . Aortic dilatation noted.  There is moderate dilatation of the  ascending aorta, measuring 42 mm.   6. The inferior vena cava is normal in size with greater than 50%  respiratory variability, suggesting right atrial pressure of 3 mmHg.  Lab work is reviewed. Hypokalemia deferred to IM   Prior to admission, the patient denies chest pain, shortness of breath, dizziness, palpitations, or syncope.  They are recovering from their stroke with plans to home at discharge.   Past Medical History:  Diagnosis Date   Arthritis    Asthma    Bicuspid aortic valve 05/2017   Likely functional bicuspid aortic valve with sclerosis and no stenosis.   Bursitis    Cataract    mild   Dysthymia    Hemorrhoids    Hx of ulcerative colitis    per dr Arnoldo Morale as per pt.   Hyperlipidemia    Hypertension    managed - labile.   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 Roxan Hockey)   Thyroid disease      Surgical History:  Past Surgical History:  Procedure Laterality Date   ABDOMINAL HYSTERECTOMY     BREAST EXCISIONAL BIOPSY Left    BUNIONECTOMY     Cardiac Event Monitor  07/2017   Overall relatively normal.  Normal sinus rhythm with rare bradycardia and tachycardia.  Heart rate ranged from 55-110 bpm.  Occasional PACs and PVCs, every single 1 was felt.  No arrhythmias other than one short 4 beat run  of PACs.   COLONOSCOPY  02-04-2005   all normal    FOOT SURGERY     3 pins in toes    HAND SURGERY     left thumb joint resection   nasal revision     NM MYOVIEW LTD  07/2017   LOW RISK study.  No ischemia or infarction.  EF greater than 65%.    SINUS IRRIGATION     TONSILLECTOMY AND ADENOIDECTOMY     TRANSTHORACIC ECHOCARDIOGRAM  06/'1, 8/'19   a) Moderate LVH. Normal EF 60-65%. Normal diastolic parameters. --> Difficult to fully  visualize the aortic valve. Cannot exclude bicuspid valve. Mild aortic stenosis noted. No PFO. Mildly dilated left atrium. Trivial MR. No comment on mitral valve prolapse. Moderately dilated ascending aorta.;; b) F/u Echo To evaluate the aortic valve. -- Bicuspid AoV - mildly thickened / calcified. - No stenosis   TRANSTHORACIC ECHOCARDIOGRAM  11/2018   Normal LV size and function.  EF 60-65%.  GR 1 DD.  Normal RV size and function.  Moderate aortic valve calcification with mild Aortic Stenosis.  Normal LV size and function.  EF 60-65%.  GR 1 DD.  Normal RV size and function.  Moderate aortic calcification with mild stenosis.  Mild dilation of ascending aorta ~4.2 mm   TRANSTHORACIC ECHOCARDIOGRAM  08/15/2020   EF 60 to 65%.  No R WMA.  GRII DD.  Mild Aortic Stenosis (mean gradient 10 mmHg).  Moderate to severe aortic root dilation (45 mm) normal IVC.  (Stable compared to February 2020)      Medications Prior to Admission  Medication Sig Dispense Refill Last Dose   amLODipine (NORVASC) 10 MG tablet TAKE 1 TABLET(10 MG) BY MOUTH DAILY (Patient taking differently: Take 10 mg by mouth daily.) 90 tablet 0 08/05/2021   aspirin 81 MG chewable tablet Chew 81 mg by mouth daily as needed for mild pain (chest pain).   08/02/2021   Bempedoic Acid (NEXLETOL) 180 MG TABS Take 180 mg by mouth daily. 90 tablet 3 08/05/2021   benzonatate (TESSALON PERLES) 100 MG capsule Take 1 capsule (100 mg total) by mouth 3 (three) times daily as needed. (Patient taking differently: Take 100 mg by mouth 3 (three) times daily as needed for cough.) 20 capsule 0 unk   carisoprodol (SOMA) 350 MG tablet TAKE 1 TABLET(350 MG) BY MOUTH THREE TIMES DAILY AS NEEDED FOR MUSCLE SPASMS (Patient taking differently: Take 350 mg by mouth 3 (three) times daily as needed for muscle spasms.) 60 tablet 2 08/04/2021   cloNIDine (CATAPRES) 0.2 MG tablet Take 0.5 tablets (0.1 mg total) by mouth 2 (two) times daily. May take an additional  0.1 mg (1/2 tablet  of 0.2 mg) if blood pressure is great than 382 systolic  for more than an hour (Patient taking differently: Take 0.1 mg by mouth 2 (two) times daily as needed (systolic over 505). if blood pressure is great than 397 systolic for more than an hour) 60 tablet 1 08/03/2021   Eszopiclone 3 MG TABS TAKE 1 TABLET BY MOUTH AT BEDTIME (Patient taking differently: Take 3 mg by mouth at bedtime.) 90 tablet 1 08/05/2021   ezetimibe (ZETIA) 10 MG tablet TAKE 1 TABLET(10 MG) BY MOUTH DAILY (Patient taking differently: Take 10 mg by mouth daily.) 90 tablet 3 08/05/2021   famotidine (PEPCID) 20 MG tablet Take 20 mg by mouth as needed for heartburn or indigestion.   unk   feeding supplement, GLUCERNA SHAKE, (GLUCERNA SHAKE) LIQD Take 237 mLs by  mouth daily as needed (if not eating meals).   Past Month   FLUoxetine (PROZAC) 40 MG capsule TAKE 1 CAPSULE(40 MG) BY MOUTH TWICE DAILY (Patient taking differently: Take 40 mg by mouth 2 (two) times daily.) 180 capsule 0 08/05/2021   levalbuterol (XOPENEX HFA) 45 MCG/ACT inhaler Inhale 1-2 puffs into the lungs every 8 (eight) hours as needed for wheezing. 1 Inhaler 2 Past Month   levothyroxine (SYNTHROID) 125 MCG tablet TAKE 1 TABLET(125 MCG) BY MOUTH DAILY (Patient taking differently: Take 125 mcg by mouth daily before breakfast.) 90 tablet 1 08/05/2021   montelukast (SINGULAIR) 10 MG tablet TAKE 1 TABLET(10 MG) BY MOUTH AT BEDTIME (Patient taking differently: Take 10 mg by mouth at bedtime.) 90 tablet 1 08/05/2021   MYRBETRIQ 50 MG TB24 tablet TAKE 1 TABLET(50 MG) BY MOUTH DAILY (Patient taking differently: Take 50 mg by mouth daily.) 90 tablet 1 08/02/2021   nebivolol (BYSTOLIC) 10 MG tablet TAKE 1 TABLET(10 MG) BY MOUTH DAILY AT 8 PM (Patient taking differently: Take 10 mg by mouth daily.) 90 tablet 0 08/05/2021   ALPRAZolam (XANAX) 0.25 MG tablet TAKE 1 TABLET(0.25 MG) BY MOUTH DAILY AS NEEDED FOR ANXIETY (Patient not taking: No sig reported) 30 tablet 5 Not Taking   memantine  (NAMENDA) 5 MG tablet Take 1 tablet (58m at night) for 2 weeks, then increase to 1 tablet (5 mg) twice a day (Patient not taking: No sig reported) 60 tablet 11 Not Taking   MINIVELLE 0.05 MG/24HR patch Place 1 patch onto the skin 2 (two) times a week.   3    Respiratory Therapy Supplies (FLUTTER) DEVI Use as directed 1 each 0     Inpatient Medications:   amLODipine  10 mg Oral Daily   aspirin  81 mg Oral Daily   clopidogrel  75 mg Oral Daily   enoxaparin (LOVENOX) injection  40 mg Subcutaneous Q24H   ezetimibe  10 mg Oral Daily   FLUoxetine  40 mg Oral BID   influenza vaccine adjuvanted  0.5 mL Intramuscular Tomorrow-1000   levothyroxine  125 mcg Oral QAC breakfast   lidocaine  1 patch Transdermal Q24H   mirabegron ER  50 mg Oral Daily   nebivolol  10 mg Oral Daily   pantoprazole  40 mg Oral Daily   sucralfate  1 g Oral TID WC & HS    Allergies:  Allergies  Allergen Reactions   Fentanyl Shortness Of Breath and Rash   Rosuvastatin Anaphylaxis, Anxiety, Hives, Itching, Nausea And Vomiting, Other (See Comments), Palpitations, Photosensitivity and Shortness Of Breath   Amoxicillin Diarrhea    Severe diarrhea, rash to vaginal area with swelling    Hctz [Hydrochlorothiazide] Other (See Comments)    HypoNatremia   Hydralazine     Weak, sweats, red skin   Codeine Other (See Comments)    makes her hyper   Conjugated Estrogens Itching and Rash   Erythromycin Rash    had a rash with emycin, has done ok with other meds in it's class   Piroxicam Itching and Rash    Feldene    Social History   Socioeconomic History   Marital status: Married    Spouse name: Not on file   Number of children: 2   Years of education: Not on file   Highest education level: Not on file  Occupational History   Occupation: retired  Tobacco Use   Smoking status: Never   Smokeless tobacco: Never  Vaping Use   Vaping Use:  Never used  Substance and Sexual Activity   Alcohol use: Yes    Comment: occ     Drug use: No   Sexual activity: Not Currently  Other Topics Concern   Not on file  Social History Narrative   Right handed    Lives with husband    Social Determinants of Health   Financial Resource Strain: Not on file  Food Insecurity: Not on file  Transportation Needs: Not on file  Physical Activity: Not on file  Stress: Not on file  Social Connections: Not on file  Intimate Partner Violence: Not on file     Family History  Problem Relation Age of Onset   Arthritis Mother    Leukemia Father    Heart disease Maternal Uncle    Diabetes Paternal Aunt    Breast cancer Maternal Grandmother    Heart disease Maternal Grandfather    Breast cancer Paternal Grandmother    Colon cancer Neg Hx       Review of Systems: All other systems reviewed and are otherwise negative except as noted above.  Physical Exam: Vitals:   08/08/21 1527 08/08/21 1932 08/09/21 0405 08/09/21 0751  BP: 137/64 139/73 (!) 146/70 (!) 147/70  Pulse: 61 (!) 58 (!) 56 63  Resp: 14 15 19 14   Temp: 98.3 F (36.8 C) 97.8 F (36.6 C) 98.3 F (36.8 C) 97.9 F (36.6 C)  TempSrc: Oral Oral Oral Oral  SpO2: 100% 98% 99% 92%  Weight:      Height:        GEN- The patient is well appearing, alert and oriented x 3 today.   Head- normocephalic, atraumatic Eyes-  Sclera clear, conjunctiva pink Ears- hearing intact Oropharynx- clear Neck- supple Lungs- CTA b/l, normal work of breathing Heart- RRR, no murmurs, rubs or gallops  GI- soft, NT, ND Extremities- no clubbing, cyanosis, or edema MS- no significant deformity or atrophy Skin- no rash or lesion Psych- euthymic mood, full affect   Labs:   Lab Results  Component Value Date   WBC 4.9 08/09/2021   HGB 11.0 (L) 08/09/2021   HCT 31.7 (L) 08/09/2021   MCV 88.3 08/09/2021   PLT 163 08/09/2021    Recent Labs  Lab 08/09/21 0336  NA 131*  K 3.0*  CL 97*  CO2 23  BUN <5*  CREATININE 0.70  CALCIUM 8.5*  PROT 5.6*  BILITOT 1.1  ALKPHOS 41   ALT 15  AST 24  GLUCOSE 94   Lab Results  Component Value Date   CKTOTAL 100 12/21/2018   TROPONINI <0.03 12/22/2018   Lab Results  Component Value Date   CHOL 250 (H) 08/07/2021   CHOL 218 (H) 04/09/2021   CHOL 246 (H) 11/21/2020   Lab Results  Component Value Date   HDL 111 08/07/2021   HDL 79 04/09/2021   HDL 72.50 11/21/2020   Lab Results  Component Value Date   LDLCALC 109 (H) 08/07/2021   LDLCALC 123 (H) 04/09/2021   LDLCALC 100 (H) 01/12/2020   Lab Results  Component Value Date   TRIG 150 (H) 08/07/2021   TRIG 90 04/09/2021   TRIG 269.0 (H) 11/21/2020   Lab Results  Component Value Date   CHOLHDL 2.3 08/07/2021   CHOLHDL 2.8 04/09/2021   CHOLHDL 3 11/21/2020   Lab Results  Component Value Date   LDLDIRECT 146.0 11/21/2020   LDLDIRECT 145.7 09/01/2013   LDLDIRECT 136.3 12/17/2011    Lab Results  Component Value Date  DDIMER 0.54 (H) 12/22/2018     Radiology/Studies:  DG Chest 2 View Result Date: 08/03/2021 CLINICAL DATA:  Chest pain, rapid speech, anxiety, hyperventilation, started new medication EXAM: CHEST - 2 VIEW COMPARISON:  05/29/2021 FINDINGS: Normal heart size, mediastinal contours, and pulmonary vascularity. Atherosclerotic calcification aorta. Ill-defined medial RIGHT upper lobe density persists, uncertain etiology. Remaining lungs clear. No pleural effusion or pneumothorax. Prior fractures of the posterolateral LEFT seventh eighth and ninth ribs. IMPRESSION: Persistent RIGHT upper lobe opacity; this could represent chronic infiltrate, scarring, or developing nodule/tumor; CT chest recommended for further assessment. Old LEFT rib fractures. Aortic Atherosclerosis (ICD10-I70.0). Electronically Signed   By: Lavonia Dana M.D.   On: 08/03/2021 17:01   DG Ribs Unilateral W/Chest Left Result Date: 08/06/2021 CLINICAL DATA:  Left wrist pain after falling. Weakness with syncopal episode. EXAM: LEFT RIBS AND CHEST - 3+ VIEW COMPARISON:  Chest  radiographs 08/03/2021.  CT 01/30/2010. FINDINGS: The heart size and mediastinal contours are stable. The lungs are clear. There is no pleural effusion or pneumothorax. There are multiple old left-sided rib fractures, involving the left 7th through 10th ribs posteriorly. No acute rib fractures are identified. There is a mildly displaced and comminuted fracture of the distal right clavicle which appears new from 05/29/2021. IMPRESSION: 1. No evidence of acute rib fracture, pleural effusion or pneumothorax. 2. Multiple left-sided rib fractures are unchanged. A fracture of the distal right clavicle is unchanged from recent prior study, although appears new from 05/29/2021. Electronically Signed   By: Richardean Sale M.D.   On: 08/06/2021 14:47   DG Wrist Complete Left Result Date: 08/06/2021 CLINICAL DATA:  Fall.  Left wrist pain. EXAM: LEFT WRIST - COMPLETE 3+ VIEW COMPARISON:  Hand radiographs 10/03/2010. FINDINGS: The mineralization and alignment are normal. There is no evidence of acute fracture or dislocation. Moderately advanced degenerative changes are again noted at the 1st carpometacarpal articulation. The additional joint spaces are preserved. There is possible dorsal soft tissue swelling without evidence of foreign body. IMPRESSION: No evidence of acute fracture or dislocation. Possible dorsal soft tissue swelling with chronic degenerative changes at the base of the thumb. Electronically Signed   By: Richardean Sale M.D.   On: 08/06/2021 14:43   CT HEAD WO CONTRAST (5MM) Result Date: 08/06/2021 CLINICAL DATA:  Head trauma. Weakness. Nausea and vomiting. Syncopal episode. EXAM: CT HEAD WITHOUT CONTRAST CT CERVICAL SPINE WITHOUT CONTRAST TECHNIQUE: Multidetector CT imaging of the head and cervical spine was performed following the standard protocol without intravenous contrast. Multiplanar CT image reconstructions of the cervical spine were also generated. COMPARISON:  10/22/2019.  Brain MRI  12/08/2020. FINDINGS: CT HEAD FINDINGS Brain: Age related volume loss. Chronic small-vessel ischemic changes affect the cerebral hemispheric white matter. There is a small right parietal cortical infarction which could be old or subacute. This was not present on an MRI study of February from this year. No evidence of mass, hemorrhage, hydrocephalus or extra-axial collection. Vascular: There is atherosclerotic calcification of the major vessels at the base of the brain. Skull: No skull fracture. Sinuses/Orbits: No fluid in the sinuses.  Orbits are negative. Other: None CT CERVICAL SPINE FINDINGS Alignment: No traumatic malalignment. Straightening of the normal cervical lordosis. 2 mm degenerative anterolisthesis C3-4. Skull base and vertebrae: No regional fracture. Soft tissues and spinal canal: No traumatic soft tissue finding. Ordinary atherosclerotic calcification of the carotid siphons. Disc levels: Facet arthropathy on the right at C3-4, C4-5 and C7-T1 and on the left at C2-3 and C7-T1. Degenerative spondylosis  with disc space narrowing and bulging of the discs at C3-4, C4-5, C5-6 and C6-7. Foraminal narrowing due to bony encroachment particularly notable on the right at C3-4 and C4-5 and on the left at C2-3, C5-6 and C6-7. Upper chest: Negative Other: None IMPRESSION: Head CT: No traumatic finding. Chronic small-vessel ischemic changes of the white matter. Small right parietal stroke that could be subacute or old, but was not present at the time of MRI done in February of this year. Cervical spine CT: No acute or traumatic finding. Chronic degenerative spondylosis and facet arthropathy as above, not significantly changed since December of 2020. Electronically Signed   By: Nelson Chimes M.D.   On: 08/06/2021 16:54     MR ANGIO HEAD WO CONTRAST Result Date: 08/06/2021 CLINICAL DATA:  Stroke, follow-up. Altered mental status. Multiple falls. Syncopal event. EXAM: MRI HEAD WITHOUT CONTRAST MRA HEAD WITHOUT  CONTRAST TECHNIQUE: Multiplanar, multi-echo pulse sequences of the brain and surrounding structures were acquired without intravenous contrast. Angiographic images of the Circle of Willis were acquired using MRA technique without intravenous contrast. COMPARISON:  Head CT earlier same day.  MRI 12/08/2020. FINDINGS: MRI HEAD FINDINGS Brain: Diffusion imaging shows a small acute to subacute cortical and subcortical infarction in the right parietal lobe, at the site of the CT abnormality. No evidence of mass effect or hemorrhage. Elsewhere, the brainstem and cerebellum are normal. There are mild to moderate chronic small-vessel ischemic changes of the cerebral hemispheric white matter no mass lesion, hemorrhage, hydrocephalus or extra-axial collection. Vascular: Major vessels at the base of the brain show flow. Skull and upper cervical spine: Negative Sinuses/Orbits: Clear/normal Other: None MRA HEAD FINDINGS Anterior circulation: Both internal carotid arteries are patent through the skull base and siphon regions. The anterior and middle cerebral vessels are patent without evidence of large vessel occlusion or correctable proximal stenosis. There is mild distal vessel atherosclerotic irregularity. Posterior circulation: Both vertebral arteries are widely patent to the basilar. No basilar stenosis. Posterior circulation branch vessels are patent. Mild atherosclerotic irregularity of the PCA branch vessels. Anatomic variants: None significant. 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 Moderate chronic small-vessel ischemic changes elsewhere. Intracranial MR angiography is negative for large vessel occlusion or correctable proximal stenosis. Mild atherosclerotic irregularity of the more distal intracranial branch vessels. Electronically Signed   By: Nelson Chimes M.D.   On: 08/06/2021 18:58     CT ABDOMEN PELVIS W  CONTRAST Result Date: 08/08/2021 CLINICAL DATA:  [place], nausea and vomiting, weakness, abdominal pain EXAM: CT ABDOMEN AND PELVIS WITH CONTRAST TECHNIQUE: Multidetector CT imaging of the abdomen and pelvis was performed using the standard protocol following bolus administration of intravenous contrast. CONTRAST:  118m OMNIPAQUE IOHEXOL 300 MG/ML  SOLN COMPARISON:  08/06/2021 FINDINGS: Lower chest: No acute pleural or parenchymal lung disease. Hepatobiliary: No focal liver abnormality is seen. No gallstones, gallbladder wall thickening, or biliary dilatation. Pancreas: Unremarkable. No pancreatic ductal dilatation or surrounding inflammatory changes. Spleen: Normal in size without focal abnormality. Adrenals/Urinary Tract: Adrenal glands are unremarkable. Kidneys are normal, without renal calculi, focal lesion, or hydronephrosis. Bladder is unremarkable. Stomach/Bowel: No bowel obstruction or ileus. Normal appendix right lower quadrant. No bowel wall thickening or inflammatory change. Vascular/Lymphatic: Aortic atherosclerosis. No enlarged abdominal or pelvic lymph nodes. Reproductive: Status post hysterectomy. No adnexal masses. Other: No free fluid or free gas.  No abdominal wall hernia. Musculoskeletal: No acute or destructive bony lesions. Multiple chronic left  rib fractures. Extensive lower lumbar spondylosis and facet hypertrophy. Reconstructed images demonstrate no additional findings. IMPRESSION: 1. No acute intra-abdominal or intrapelvic process. No evidence of trauma. 2.  Aortic Atherosclerosis (ICD10-I70.0). Electronically Signed   By: Randa Ngo M.D.   On: 08/08/2021 15:52   DG Abd 2 Views Result Date: 08/06/2021 CLINICAL DATA:  Nausea and vomiting. Altered mental status and frequent falls EXAM: ABDOMEN - 2 VIEW COMPARISON:  None. FINDINGS: Scattered gas and stool throughout the colon. No small or large bowel distention. No free intra-abdominal air. No abnormal air-fluid levels. No radiopaque  stones. Degenerative changes in the spine and hips. Lumbar scoliosis convex towards the right. Lung bases are clear. Old left rib fractures. IMPRESSION: Normal nonobstructive bowel gas pattern. Electronically Signed   By: Lucienne Capers M.D.   On: 08/06/2021 19:58    VAS US CAROTID Result Date: 08/07/2021 Carotid Arterial Duplex Study Patient Name:  [facility] Dahlia Bailiff  Date of Exam:   08/07/2021 Medical Rec #: [id number]        Accession #:    [id number] Date of Birth: 15-Sep-1946        Patient Gender: F Patient Age:   75 years Exam Location:  [facility] Procedure:      VAS US CAROTID Referring Phys: Wynetta Fines --------------------------------------------------------------------------------  Indications:       CVA and AMS with multiple falls. Risk Factors:      Hypertension, hyperlipidemia, no history of smoking. Other Factors:     AO stenosis. Comparison Study:  No previous exams Performing Technologist: Jody Hill RVT, RDMS  Examination Guidelines: A complete evaluation includes B-mode imaging, spectral Doppler, color Doppler, and power Doppler as needed of all accessible portions of each vessel. Bilateral testing is considered an integral part of a complete examination. Limited examinations for reoccurring indications may be performed as noted.  Right Carotid Findings: - Summary: Right Carotid: The extracranial vessels were near-normal with only minimal wall                thickening or plaque. Left Carotid: The extracranial vessels were near-normal with only minimal wall               thickening or plaque. Vertebrals:  Bilateral vertebral arteries demonstrate antegrade flow. Subclavians: Normal flow hemodynamics were seen in bilateral subclavian              arteries. *See table(s) above for measurements and observations.  Electronically signed by Antony Contras MD on 08/07/2021 at 12:54:16 PM.    Final     12-lead ECG SR All prior EKG's in EPIC reviewed with no documented atrial  fibrillation  Telemetry off telemetry, CV strips reviewed, no AF  Assessment and Plan:  1. Cryptogenic stroke The patient presents with cryptogenic stroke.   I spoke at length with the patient and her husband at bedside about monitoring for afib with either a 30 day event monitor or an implantable loop recorder.  Risks, benefits, and alteratives to implantable loop recorder were discussed with the patient today.   At this time, the patient is very clear in their decision to proceed with implantable loop recorder.   Wound care was reviewed with the patient (keep incision clean and dry for 3 days).  Wound check follow up will be scheduled for the patient.  Please call with questions.   Lachae Hohler Dyane Dustman, PA-C 08/09/2021

## 2021-08-09 NOTE — Plan of Care (Signed)
Pt is alert oriented x 4. Ambulates with walker. Pt c/o severe muscle spasms and increased anxiety. PRN soma given with effective results and PRN ativan given with effective results.    Problem: Education: Goal: Knowledge of secondary prevention will improve Outcome: Progressing   Problem: Coping: Goal: Will verbalize positive feelings about self Outcome: Progressing   Problem: Self-Care: Goal: Ability to participate in self-care as condition permits will improve Outcome: Progressing   Problem: Ischemic Stroke/TIA Tissue Perfusion: Goal: Complications of ischemic stroke/TIA will be minimized Outcome: Progressing

## 2021-08-09 NOTE — Evaluation (Signed)
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[place]) 94/49/6759   Chronic cough 06/01/2021   Memory loss 07/15/2020   Malnutrition ([place]) 12/22/2018   Hypokalemia 12/22/2018   Hypomagnesemia 12/22/2018   Hypophosphatemia 12/22/2018   Anxiety    Prolonged QT interval 12/21/2018   Hyponatremia 12/21/2018   Hyperlipidemia with target LDL less than 100 10/02/2018   Encounter for chronic pain management 11/28/2017   Dyspnea on exertion 06/01/2017   Mild aortic stenosis 05/27/2017   Routine general medical examination at a health care facility 07/09/2016   Frequent falls 03/28/2016   Rosacea 08/18/2015   Lumbar radiculopathy 07/07/2015   Urinary incontinence 06/20/2015   Hemorrhoids 10/06/2014   Cough variant asthma vs UACS 08/03/2014   Labile hypertension 02/22/2014   Insomnia secondary to chronic pain 07/09/2012   B12 deficiency 04/03/2011   Unspecified vitamin D deficiency 07/25/2010   ANEMIA, IRON DEFICIENCY 04/25/2009   Allergic rhinitis 02/01/2009   Mild dilation of ascending aorta ([facility]) 12/27/2008   Chronic fatigue syndrome 04/21/2008   Major depression in partial remission ([place]) 01/19/2008   Osteoarthritis 01/19/2008   Hypothyroidism 03/13/2007   Asthma 03/13/2007   Fibromyalgia 03/13/2007   Past Medical History:  Past Medical History:  Diagnosis Date   Arthritis    Asthma    Bicuspid aortic valve 05/2017   Likely functional bicuspid aortic valve with sclerosis and no stenosis.   Bursitis    Cataract    mild   Dysthymia    Hemorrhoids    Hx of ulcerative colitis    per dr Arnoldo Morale as per pt.   Hyperlipidemia     Hypertension    managed - labile.   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 Roxan Hockey)   Thyroid disease    Past Surgical History:  Past Surgical History:  Procedure Laterality Date   ABDOMINAL HYSTERECTOMY     BREAST EXCISIONAL BIOPSY Left    BUNIONECTOMY     Cardiac Event Monitor  07/2017   Overall relatively normal.  Normal sinus rhythm with rare bradycardia and tachycardia.  Heart rate ranged from 55-110 bpm.  Occasional PACs and PVCs, every single 1 was felt.  No arrhythmias other than one short 4 beat run of PACs.   COLONOSCOPY  02-04-2005   all normal    FOOT SURGERY     3 pins in toes    HAND SURGERY     left thumb joint resection   nasal revision     NM MYOVIEW LTD  07/2017   LOW RISK study.  No ischemia or infarction.  EF greater than 65%.    SINUS IRRIGATION     TONSILLECTOMY AND ADENOIDECTOMY     TRANSTHORACIC ECHOCARDIOGRAM  06/'1, 8/'19   a) Moderate LVH. Normal EF 60-65%. Normal diastolic parameters. --> Difficult to fully visualize the aortic valve. Cannot exclude bicuspid valve. Mild aortic stenosis  noted. No PFO. Mildly dilated left atrium. Trivial MR. No comment on mitral valve prolapse. Moderately dilated ascending aorta.;; b) F/u Echo To evaluate the aortic valve. -- Bicuspid AoV - mildly thickened / calcified. - No stenosis   TRANSTHORACIC ECHOCARDIOGRAM  11/2018   Normal LV size and function.  EF 60-65%.  GR 1 DD.  Normal RV size and function.  Moderate aortic valve calcification with mild Aortic Stenosis.  Normal LV size and function.  EF 60-65%.  GR 1 DD.  Normal RV size and function.  Moderate aortic calcification with mild stenosis.  Mild dilation of ascending aorta ~4.2 mm   TRANSTHORACIC ECHOCARDIOGRAM  08/15/2020   EF 60 to 65%.   No R WMA.  GRII DD.  Mild Aortic Stenosis (mean gradient 10 mmHg).  Moderate to severe aortic root dilation (45 mm) normal IVC.  (Stable compared to February 2020)    HPI:  75yo female admitted 08/06/21 with recurrent falls, dizziness frequent nightmares. PMH: mild cognitive impairment, asthma, anxiety, depression, HTN, HLD, occasional slurred speech/communication deficits. MRI = incidental finding of acute vs subacute stroke in right parietal area. Last MoCA 17/30 (unsure of administration date)   Assessment / Plan / Recommendation Clinical Impression  Pt was seen for cognitive evaluation at bedside. Pt has a documented history of mild cognitive deficits. Previous score on the Montreal Cognitive Assessment (MoCA) was 17/30, however test date is unknown. Pt and her husband report deficits were first identified 7 months ago. Pt verbalizes awareness of her cognitive deficits. The [place] Mental Status (SLUMS) Examination was administered today. Pt scored 13/30, which pt stated is "worse" than previous testing scores. Deficits were noted on mental math, immediate and delayed recall, mental math, thought organization, digit reversal, and auditory attention and recall. Pt also had difficulty with visuospatial and clock drawing task, raising concern for impairment of executive functions. At this time, 24 hour supervision is recommended for pt safety, given the significance and breadth of her deficits. Home health speech therapy is also recommended to facilitate establishment of routines and structure, as well as increase safety in the home environment.    SLP Assessment  SLP Recommendation/Assessment: All further Speech Language Pathology  needs can be addressed in the next venue of care  SLP Visit Diagnosis: Cognitive communication deficit (R41.841)    Recommendations for follow up therapy are one component of a multi-disciplinary discharge planning process, led by the attending physician.   Recommendations may be updated based on patient status, additional functional criteria and insurance authorization.    Follow Up Recommendations  Home health SLP;24 hour supervision/assistance          SLP Evaluation Cognition  Overall Cognitive Status: History of cognitive impairments - at baseline Arousal/Alertness: Awake/alert Orientation Level: Oriented X4       Comprehension  Auditory Comprehension Overall Auditory Comprehension: Appears within functional limits for tasks assessed Visual Recognition/Discrimination Discrimination: Not tested Reading Comprehension Reading Status: Not tested    Expression Expression Primary Mode of Expression: Verbal Verbal Expression Overall Verbal Expression: Appears within functional limits for tasks assessed Written Expression Dominant Hand: Right Written Expression: Not tested   Oral / Motor  Oral Motor/Sensory Function Overall Oral Motor/Sensory Function: Within functional limits Motor Speech Overall Motor Speech: Appears within functional limits for tasks assessed   GO                   Caragh Gasper B. [name], [place], [place] Speech Language Pathologist Office: [phone]  Shonna Chock  08/09/2021, 12:02 PM

## 2021-08-09 NOTE — Discharge Instructions (Addendum)
Post procedure wound care instructions Keep incision clean and dry for 3 days. You can remove outer plastic dressing after 3 days. Leave steri-strips (little pieces of tape) on until seen in the office for wound check appointment. Call the office [phone]) for redness, drainage, swelling, or fever.

## 2021-08-09 NOTE — Discharge Summary (Signed)
Physician Discharge Summary  Marissa [name] AOZ:[id number] DOB: [birth year] DOA: 08/06/2021  PCP: [name], MD  Admit date: 08/06/2021 Discharge date: 08/09/2021  Admitted From: home Disposition:  home  Recommendations for Outpatient Follow-up:  Follow up with PCP within 1-2 weeks to discuss medication management as possible side effects contributed to presenting symptoms. Memantine was discontinued during admission.  Follow up with neurology in about 2-4 weeks  Home Health:PT, SLP  Discharge Condition:stable CODE STATUS:full Diet recommendation: regular  Brief/Interim Summary: Pt presented with AMS most likely related to medication side effect/interactions which gradually improved with discontinuation of memantine. However, she was found to have acute CVA. Neurology recommended ASA+plavix x90 days followed by ASA alone and a loop recorder prior to dc. She was stable on dc.   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[facility])   Allergies as of 08/09/2021       Reactions   Fentanyl Shortness Of Breath, Rash   Rosuvastatin Anaphylaxis, Anxiety, Hives, Itching, Nausea And Vomiting, Other (See Comments), Palpitations, Photosensitivity, Shortness Of Breath   Amoxicillin Diarrhea   Severe diarrhea, rash to vaginal area with swelling   Hctz [hydrochlorothiazide] Other (See Comments)   HypoNatremia   Hydralazine    Weak, sweats, red skin   Codeine Other (See Comments)   makes her hyper   Conjugated Estrogens Itching, Rash   Erythromycin Rash   had a rash with emycin, has done ok with other meds in it's class   Piroxicam Itching, Rash   Feldene        Medication List     STOP taking these medications    ALPRAZolam 0.25 MG tablet Commonly known as: XANAX   aspirin 81 MG chewable tablet Replaced by: aspirin EC 81 MG tablet   benzonatate 100 MG capsule Commonly known as: Tessalon  Perles   cloNIDine 0.2 MG tablet Commonly known as: CATAPRES   Eszopiclone 3 MG Tabs   memantine 5 MG tablet Commonly known as: NAMENDA   Minivelle 0.05 MG/24HR patch Generic drug: estradiol   montelukast 10 MG tablet Commonly known as: SINGULAIR   Nexletol 180 MG Tabs Generic drug: Bempedoic Acid       TAKE these medications    amLODipine 10 MG tablet Commonly known as: NORVASC TAKE 1 TABLET(10 MG) BY MOUTH DAILY What changed: See the new instructions.   aspirin EC 81 MG tablet Take 1 tablet (81 mg total) by mouth daily. Swallow whole. Replaces: aspirin 81 MG chewable tablet   carisoprodol 350 MG tablet Commonly known as: SOMA TAKE 1 TABLET(350 MG) BY MOUTH THREE TIMES DAILY AS NEEDED FOR MUSCLE SPASMS What changed: See the new instructions.   clopidogrel 75 MG tablet Commonly known as: PLAVIX Take 1 tablet (75 mg total) by mouth daily. Start taking on: August 10, 2021   ezetimibe 10 MG tablet Commonly known as: ZETIA TAKE 1 TABLET(10 MG) BY MOUTH DAILY What changed: See the new instructions.   famotidine 20 MG tablet Commonly known as: PEPCID Take 20 mg by mouth as needed for heartburn or indigestion.   feeding supplement (GLUCERNA SHAKE) Liqd Take 237 mLs by mouth daily as needed (if not eating meals).   FLUoxetine 40 MG capsule Commonly known as: PROZAC TAKE 1 CAPSULE(40 MG) BY MOUTH TWICE DAILY What changed: See the new instructions.   Flutter Devi Use as directed   influenza vaccine adjuvanted 0.5 ML injection Commonly known as:  FLUAD Inject 0.5 mLs into the muscle tomorrow at 10 am for 1 dose.   levalbuterol 45 MCG/ACT inhaler Commonly known as: XOPENEX HFA Inhale 1-2 puffs into the lungs every 8 (eight) hours as needed for wheezing.   levothyroxine 125 MCG tablet Commonly known as: SYNTHROID TAKE 1 TABLET(125 MCG) BY MOUTH DAILY What changed: See the new instructions.   Myrbetriq 50 MG Tb24 tablet Generic drug: mirabegron ER TAKE  1 TABLET(50 MG) BY MOUTH DAILY What changed: See the new instructions.   nebivolol 10 MG tablet Commonly known as: BYSTOLIC TAKE 1 GEXBMW(41 MG) BY MOUTH DAILY AT 8 PM What changed: See the new instructions.        Follow-up Information     [facility] Follow up.   Specialty: Home Health Services Why: The home health agency will contact you for the first home visit. Contact information: [place] [address] [phone]         [place] [facility] Follow up.   Specialty: Cardiology Why: 08/22/21 @ 9:20AM, wound check visit Contact information: [address] [place] [address] [phone]               Allergies  Allergen Reactions   Fentanyl Shortness Of Breath and Rash   Rosuvastatin Anaphylaxis, Anxiety, Hives, Itching, Nausea And Vomiting, Other (See Comments), Palpitations, Photosensitivity and Shortness Of Breath   Amoxicillin Diarrhea    Severe diarrhea, rash to vaginal area with swelling    Hctz [Hydrochlorothiazide] Other (See Comments)    HypoNatremia   Hydralazine     Weak, sweats, red skin   Codeine Other (See Comments)    makes her hyper   Conjugated Estrogens Itching and Rash   Erythromycin Rash    had a rash with emycin, has done ok with other meds in it's class   Piroxicam Itching and Rash    Feldene   Consultations: neurology  Procedures/Studies: DG Chest 2 View  Result Date: 08/03/2021 CLINICAL DATA:  Chest pain, rapid speech, anxiety, hyperventilation, started new medication EXAM: CHEST - 2 VIEW COMPARISON:  05/29/2021 FINDINGS: Normal heart size, mediastinal contours, and pulmonary vascularity. Atherosclerotic calcification aorta. Ill-defined medial RIGHT upper lobe density persists, uncertain etiology. Remaining lungs clear. No pleural effusion or pneumothorax. Prior fractures of the posterolateral LEFT seventh eighth and ninth ribs. IMPRESSION:  Persistent RIGHT upper lobe opacity; this could represent chronic infiltrate, scarring, or developing nodule/tumor; CT chest recommended for further assessment. Old LEFT rib fractures. Aortic Atherosclerosis (ICD10-I70.0). Electronically Signed   By: Lavonia Dana M.D.   On: 08/03/2021 17:01   DG Ribs Unilateral W/Chest Left  Result Date: 08/06/2021 CLINICAL DATA:  Left wrist pain after falling. Weakness with syncopal episode. EXAM: LEFT RIBS AND CHEST - 3+ VIEW COMPARISON:  Chest radiographs 08/03/2021.  CT 01/30/2010. FINDINGS: The heart size and mediastinal contours are stable. The lungs are clear. There is no pleural effusion or pneumothorax. There are multiple old left-sided rib fractures, involving the left 7th through 10th ribs posteriorly. No acute rib fractures are identified. There is a mildly displaced and comminuted fracture of the distal right clavicle which appears new from 05/29/2021. IMPRESSION: 1. No evidence of acute rib fracture, pleural effusion or pneumothorax. 2. Multiple left-sided rib fractures are unchanged. A fracture of the distal right clavicle is unchanged from recent prior study, although appears new from 05/29/2021. Electronically Signed   By: Richardean Sale M.D.   On: 08/06/2021 14:47  DG Wrist Complete Left  Result Date: 08/06/2021 CLINICAL DATA:  Fall.  Left wrist pain. EXAM: LEFT WRIST - COMPLETE 3+ VIEW COMPARISON:  Hand radiographs 10/03/2010. FINDINGS: The mineralization and alignment are normal. There is no evidence of acute fracture or dislocation. Moderately advanced degenerative changes are again noted at the 1st carpometacarpal articulation. The additional joint spaces are preserved. There is possible dorsal soft tissue swelling without evidence of foreign body. IMPRESSION: No evidence of acute fracture or dislocation. Possible dorsal soft tissue swelling with chronic degenerative changes at the base of the thumb. Electronically Signed   By: Richardean Sale M.D.    On: 08/06/2021 14:43   CT HEAD WO CONTRAST (5MM)  Result Date: 08/06/2021 CLINICAL DATA:  Head trauma. Weakness. Nausea and vomiting. Syncopal episode. EXAM: CT HEAD WITHOUT CONTRAST CT CERVICAL SPINE WITHOUT CONTRAST TECHNIQUE: Multidetector CT imaging of the head and cervical spine was performed following the standard protocol without intravenous contrast. Multiplanar CT image reconstructions of the cervical spine were also generated. COMPARISON:  10/22/2019.  Brain MRI 12/08/2020. FINDINGS: CT HEAD FINDINGS Brain: Age related volume loss. Chronic small-vessel ischemic changes affect the cerebral hemispheric white matter. There is a small right parietal cortical infarction which could be old or subacute. This was not present on an MRI study of February from this year. No evidence of mass, hemorrhage, hydrocephalus or extra-axial collection. Vascular: There is atherosclerotic calcification of the major vessels at the base of the brain. Skull: No skull fracture. Sinuses/Orbits: No fluid in the sinuses.  Orbits are negative. Other: None CT CERVICAL SPINE FINDINGS Alignment: No traumatic malalignment. Straightening of the normal cervical lordosis. 2 mm degenerative anterolisthesis C3-4. Skull base and vertebrae: No regional fracture. Soft tissues and spinal canal: No traumatic soft tissue finding. Ordinary atherosclerotic calcification of the carotid siphons. Disc levels: Facet arthropathy on the right at C3-4, C4-5 and C7-T1 and on the left at C2-3 and C7-T1. Degenerative spondylosis with disc space narrowing and bulging of the discs at C3-4, C4-5, C5-6 and C6-7. Foraminal narrowing due to bony encroachment particularly notable on the right at C3-4 and C4-5 and on the left at C2-3, C5-6 and C6-7. Upper chest: Negative Other: None IMPRESSION: Head CT: No traumatic finding. Chronic small-vessel ischemic changes of the white matter. Small right parietal stroke that could be subacute or old, but was not present at  the time of MRI done in February of this year. Cervical spine CT: No acute or traumatic finding. Chronic degenerative spondylosis and facet arthropathy as above, not significantly changed since December of 2020. Electronically Signed   By: Nelson Chimes M.D.   On: 08/06/2021 16:54   CT Cervical Spine Wo Contrast  Result Date: 08/06/2021 CLINICAL DATA:  Head trauma. Weakness. Nausea and vomiting. Syncopal episode. EXAM: CT HEAD WITHOUT CONTRAST CT CERVICAL SPINE WITHOUT CONTRAST TECHNIQUE: Multidetector CT imaging of the head and cervical spine was performed following the standard protocol without intravenous contrast. Multiplanar CT image reconstructions of the cervical spine were also generated. COMPARISON:  10/22/2019.  Brain MRI 12/08/2020. FINDINGS: CT HEAD FINDINGS Brain: Age related volume loss. Chronic small-vessel ischemic changes affect the cerebral hemispheric white matter. There is a small right parietal cortical infarction which could be old or subacute. This was not present on an MRI study of February from this year. No evidence of mass, hemorrhage, hydrocephalus or extra-axial collection. Vascular: There is atherosclerotic calcification of the major vessels at the base of the brain. Skull: No skull fracture. Sinuses/Orbits: No fluid in  the sinuses.  Orbits are negative. Other: None CT CERVICAL SPINE FINDINGS Alignment: No traumatic malalignment. Straightening of the normal cervical lordosis. 2 mm degenerative anterolisthesis C3-4. Skull base and vertebrae: No regional fracture. Soft tissues and spinal canal: No traumatic soft tissue finding. Ordinary atherosclerotic calcification of the carotid siphons. Disc levels: Facet arthropathy on the right at C3-4, C4-5 and C7-T1 and on the left at C2-3 and C7-T1. Degenerative spondylosis with disc space narrowing and bulging of the discs at C3-4, C4-5, C5-6 and C6-7. Foraminal narrowing due to bony encroachment particularly notable on the right at C3-4 and  C4-5 and on the left at C2-3, C5-6 and C6-7. Upper chest: Negative Other: None IMPRESSION: Head CT: No traumatic finding. Chronic small-vessel ischemic changes of the white matter. Small right parietal stroke that could be subacute or old, but was not present at the time of MRI done in February of this year. Cervical spine CT: No acute or traumatic finding. Chronic degenerative spondylosis and facet arthropathy as above, not significantly changed since December of 2020. Electronically Signed   By: Nelson Chimes M.D.   On: 08/06/2021 16:54   MR ANGIO HEAD WO CONTRAST  Result Date: 08/06/2021 CLINICAL DATA:  Stroke, follow-up. Altered mental status. Multiple falls. Syncopal event. EXAM: MRI HEAD WITHOUT CONTRAST MRA HEAD WITHOUT CONTRAST TECHNIQUE: Multiplanar, multi-echo pulse sequences of the brain and surrounding structures were acquired without intravenous contrast. Angiographic images of the Circle of Willis were acquired using MRA technique without intravenous contrast. COMPARISON:  Head CT earlier same day.  MRI 12/08/2020. FINDINGS: MRI HEAD FINDINGS Brain: Diffusion imaging shows a small acute to subacute cortical and subcortical infarction in the right parietal lobe, at the site of the CT abnormality. No evidence of mass effect or hemorrhage. Elsewhere, the brainstem and cerebellum are normal. There are mild to moderate chronic small-vessel ischemic changes of the cerebral hemispheric white matter no mass lesion, hemorrhage, hydrocephalus or extra-axial collection. Vascular: Major vessels at the base of the brain show flow. Skull and upper cervical spine: Negative Sinuses/Orbits: Clear/normal Other: None MRA HEAD FINDINGS Anterior circulation: Both internal carotid arteries are patent through the skull base and siphon regions. The anterior and middle cerebral vessels are patent without evidence of large vessel occlusion or correctable proximal stenosis. There is mild distal vessel atherosclerotic  irregularity. Posterior circulation: Both vertebral arteries are widely patent to the basilar. No basilar stenosis. Posterior circulation branch vessels are patent. Mild atherosclerotic irregularity of the PCA branch vessels. Anatomic variants: None significant. 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 Moderate chronic small-vessel ischemic changes elsewhere. Intracranial MR angiography is negative for large vessel occlusion or correctable proximal stenosis. Mild atherosclerotic irregularity of the more distal intracranial branch vessels. Electronically Signed   By: Nelson Chimes M.D.   On: 08/06/2021 18:58   MR BRAIN WO CONTRAST  Result Date: 08/06/2021 CLINICAL DATA:  Stroke, follow-up. Altered mental status. Multiple falls. Syncopal event. EXAM: MRI HEAD WITHOUT CONTRAST MRA HEAD WITHOUT CONTRAST TECHNIQUE: Multiplanar, multi-echo pulse sequences of the brain and surrounding structures were acquired without intravenous contrast. Angiographic images of the Circle of Willis were acquired using MRA technique without intravenous contrast. COMPARISON:  Head CT earlier same day.  MRI 12/08/2020. FINDINGS: MRI HEAD FINDINGS Brain: Diffusion imaging shows a small acute to subacute cortical and subcortical infarction in the right parietal lobe, at the site of the CT abnormality. No evidence of mass effect or hemorrhage.  Elsewhere, the brainstem and cerebellum are normal. There are mild to moderate chronic small-vessel ischemic changes of the cerebral hemispheric white matter no mass lesion, hemorrhage, hydrocephalus or extra-axial collection. Vascular: Major vessels at the base of the brain show flow. Skull and upper cervical spine: Negative Sinuses/Orbits: Clear/normal Other: None MRA HEAD FINDINGS Anterior circulation: Both internal carotid arteries are patent through the skull base and siphon regions. The anterior and  middle cerebral vessels are patent without evidence of large vessel occlusion or correctable proximal stenosis. There is mild distal vessel atherosclerotic irregularity. Posterior circulation: Both vertebral arteries are widely patent to the basilar. No basilar stenosis. Posterior circulation branch vessels are patent. Mild atherosclerotic irregularity of the PCA branch vessels. Anatomic variants: None significant. 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 Moderate chronic small-vessel ischemic changes elsewhere. Intracranial MR angiography is negative for large vessel occlusion or correctable proximal stenosis. Mild atherosclerotic irregularity of the more distal intracranial branch vessels. Electronically Signed   By: Nelson Chimes M.D.   On: 08/06/2021 18:58   CT ABDOMEN PELVIS W CONTRAST  Result Date: 08/08/2021 CLINICAL DATA:  [place], nausea and vomiting, weakness, abdominal pain EXAM: CT ABDOMEN AND PELVIS WITH CONTRAST TECHNIQUE: Multidetector CT imaging of the abdomen and pelvis was performed using the standard protocol following bolus administration of intravenous contrast. CONTRAST:  172m OMNIPAQUE IOHEXOL 300 MG/ML  SOLN COMPARISON:  08/06/2021 FINDINGS: Lower chest: No acute pleural or parenchymal lung disease. Hepatobiliary: No focal liver abnormality is seen. No gallstones, gallbladder wall thickening, or biliary dilatation. Pancreas: Unremarkable. No pancreatic ductal dilatation or surrounding inflammatory changes. Spleen: Normal in size without focal abnormality. Adrenals/Urinary Tract: Adrenal glands are unremarkable. Kidneys are normal, without renal calculi, focal lesion, or hydronephrosis. Bladder is unremarkable. Stomach/Bowel: No bowel obstruction or ileus. Normal appendix right lower quadrant. No bowel wall thickening or inflammatory change. Vascular/Lymphatic: Aortic atherosclerosis. No  enlarged abdominal or pelvic lymph nodes. Reproductive: Status post hysterectomy. No adnexal masses. Other: No free fluid or free gas.  No abdominal wall hernia. Musculoskeletal: No acute or destructive bony lesions. Multiple chronic left rib fractures. Extensive lower lumbar spondylosis and facet hypertrophy. Reconstructed images demonstrate no additional findings. IMPRESSION: 1. No acute intra-abdominal or intrapelvic process. No evidence of trauma. 2.  Aortic Atherosclerosis (ICD10-I70.0). Electronically Signed   By: MRanda NgoM.D.   On: 08/08/2021 15:52   DG Abd 2 Views  Result Date: 08/06/2021 CLINICAL DATA:  Nausea and vomiting. Altered mental status and frequent falls EXAM: ABDOMEN - 2 VIEW COMPARISON:  None. FINDINGS: Scattered gas and stool throughout the colon. No small or large bowel distention. No free intra-abdominal air. No abnormal air-fluid levels. No radiopaque stones. Degenerative changes in the spine and hips. Lumbar scoliosis convex towards the right. Lung bases are clear. Old left rib fractures. IMPRESSION: Normal nonobstructive bowel gas pattern. Electronically Signed   By: WLucienne CapersM.D.   On: 08/06/2021 19:58   ECHOCARDIOGRAM COMPLETE  Result Date: 08/08/2021    ECHOCARDIOGRAM REPORT   [facility]   [facility] BailiffDate of Exam: 08/08/2021 Medical Rec #:  [id number]      Height:       64.5 in Accession #:    [id number]     Weight:       132.0 lb Date of Birth:  [birth year]      BSA:          1.648  m Patient Age:    75 years        BP:           103/76 mmHg Patient Gender: F               HR:           62 bpm. Exam Location:  Inpatient Procedure: 2D Echo Indications:    stroke  History:        Patient has prior history of Echocardiogram examinations, most                 recent 08/15/2020. Risk Factors:Dyslipidemia.  Sonographer:    [place] Referring Phys: [id number] [place]  1. Left ventricular ejection fraction, by estimation, is 60 to  65%. The left ventricle has normal function. The left ventricle has no regional wall motion abnormalities. Left ventricular diastolic parameters were normal.  2. Right ventricular systolic function is normal. The right ventricular size is normal. There is normal pulmonary artery systolic pressure.  3. The mitral valve is abnormal. Trivial mitral valve regurgitation. No evidence of mitral stenosis.  4. ? functoinally bicuspid with poor visualization of commisure between right and left cusp Non coronary cusp very sclerotic Gradients stable since TTE done 08/15/20. The aortic valve is tricuspid. There is moderate calcification of the aortic valve. Aortic valve regurgitation is not visualized. Mild aortic valve stenosis.  5. Aortic root stable since TTE done 08/15/20 . Aortic dilatation noted. There is moderate dilatation of the ascending aorta, measuring 42 mm.  6. The inferior vena cava is normal in size with greater than 50% respiratory variability, suggesting right atrial pressure of 3 mmHg. FINDINGS  Left Ventricle: Left ventricular ejection fraction, by estimation, is 60 to 65%. The left ventricle has normal function. The left ventricle has no regional wall motion abnormalities. The left ventricular internal cavity size was normal in size. There is  no left ventricular hypertrophy. Left ventricular diastolic parameters were normal. Right Ventricle: The right ventricular size is normal. No increase in right ventricular wall thickness. Right ventricular systolic function is normal. There is normal pulmonary artery systolic pressure. The tricuspid regurgitant velocity is 2.38 m/s, and  with an assumed right atrial pressure of 3 mmHg, the estimated right ventricular systolic pressure is 93.7 mmHg. Left Atrium: Left atrial size was normal in size. Right Atrium: Right atrial size was normal in size. Pericardium: There is no evidence of pericardial effusion. Mitral Valve: The mitral valve is abnormal. There is mild  thickening of the mitral valve leaflet(s). There is mild calcification of the mitral valve leaflet(s). Mild mitral annular calcification. Trivial mitral valve regurgitation. No evidence of mitral valve stenosis. Tricuspid Valve: The tricuspid valve is normal in structure. Tricuspid valve regurgitation is mild . No evidence of tricuspid stenosis. Aortic Valve: ? functoinally bicuspid with poor visualization of commisure between right and left cusp Non coronary cusp very sclerotic Gradients stable since TTE done 08/15/20. The aortic valve is tricuspid. There is moderate calcification of the aortic  valve. Aortic valve regurgitation is not visualized. Mild aortic stenosis is present. Aortic valve mean gradient measures 10.0 mmHg. Aortic valve peak gradient measures 18.7 mmHg. Aortic valve area, by VTI measures 1.35 cm. Pulmonic Valve: The pulmonic valve was normal in structure. Pulmonic valve regurgitation is not visualized. No evidence of pulmonic stenosis. Aorta: Aortic root stable since TTE done 08/15/20. The aortic root is normal in size and structure and aortic dilatation noted. There is moderate dilatation  of the ascending aorta, measuring 42 mm. Venous: The inferior vena cava is normal in size with greater than 50% respiratory variability, suggesting right atrial pressure of 3 mmHg. IAS/Shunts: No atrial level shunt detected by color flow Doppler.  LEFT VENTRICLE PLAX 2D LVIDd:         3.60 cm   Diastology LVIDs:         2.30 cm   LV e' medial:    7.40 cm/s LV PW:         1.00 cm   LV E/e' medial:  10.0 LV IVS:        1.00 cm   LV e' lateral:   8.81 cm/s LVOT diam:     1.90 cm   LV E/e' lateral: 8.4 LV SV:         62 LV SV Index:   38 LVOT Area:     2.84 cm  RIGHT VENTRICLE             IVC RV S prime:     13.40 cm/s  IVC diam: 1.10 cm TAPSE (M-mode): 1.8 cm LEFT ATRIUM             Index        RIGHT ATRIUM           Index LA diam:        3.40 cm 2.06 cm/m   RA Area:     10.40 cm LA Vol (A2C):   43.5 ml 26.39  ml/m  RA Volume:   18.80 ml  11.40 ml/m LA Vol (A4C):   38.9 ml 23.60 ml/m LA Biplane Vol: 42.9 ml 26.02 ml/m  AORTIC VALVE AV Area (Vmax):    1.33 cm AV Area (Vmean):   1.29 cm AV Area (VTI):     1.35 cm AV Vmax:           216.00 cm/s AV Vmean:          140.000 cm/s AV VTI:            0.460 m AV Peak Grad:      18.7 mmHg AV Mean Grad:      10.0 mmHg LVOT Vmax:         101.00 cm/s LVOT Vmean:        63.600 cm/s LVOT VTI:          0.219 m LVOT/AV VTI ratio: 0.48  AORTA Ao Root diam: 3.10 cm Ao Asc diam:  4.20 cm MITRAL VALVE               TRICUSPID VALVE MV Area (PHT): 3.06 cm    TR Peak grad:   22.7 mmHg MV Decel Time: 248 msec    TR Vmax:        238.00 cm/s MV E velocity: 73.70 cm/s MV A velocity: 75.80 cm/s  SHUNTS MV E/A ratio:  0.97        Systemic VTI:  0.22 m                            Systemic Diam: 1.90 cm Jenkins Rouge MD Electronically signed by Jenkins Rouge MD Signature Date/Time: 08/08/2021/12:23:21 PM    Final    VAS US CAROTID  Result Date: 08/07/2021 Carotid Arterial Duplex Study Patient Name:  [facility] Dahlia Bailiff  Date of Exam:   08/07/2021 Medical Rec #: [id number]        Accession #:  [id number] Date of Birth: July 06, 1946        Patient Gender: F Patient Age:   70 years Exam Location:  [facility] Procedure:      VAS US CAROTID Referring Phys: Wynetta Fines --------------------------------------------------------------------------------  Indications:       CVA and AMS with multiple falls. Risk Factors:      Hypertension, hyperlipidemia, no history of smoking. Other Factors:     AO stenosis. Comparison Study:  No previous exams Performing Technologist: Jody Hill RVT, RDMS  Examination Guidelines: A complete evaluation includes B-mode imaging, spectral Doppler, color Doppler, and power Doppler as needed of all accessible portions of each vessel. Bilateral testing is considered an integral part of a complete examination. Limited examinations for reoccurring indications may be performed as  noted.  Right Carotid Findings: +----------+--------+--------+--------+------------------+------------------+           PSV cm/sEDV cm/sStenosisPlaque DescriptionComments           +----------+--------+--------+--------+------------------+------------------+ CCA Prox  71      12                                intimal thickening +----------+--------+--------+--------+------------------+------------------+ CCA Distal66      9                                 intimal thickening +----------+--------+--------+--------+------------------+------------------+ ICA Prox  48      16                                                   +----------+--------+--------+--------+------------------+------------------+ ICA Distal59      18                                                   +----------+--------+--------+--------+------------------+------------------+ ECA       132     17                                                   +----------+--------+--------+--------+------------------+------------------+ +----------+--------+-------+----------------+-------------------+           PSV cm/sEDV cmsDescribe        Arm Pressure (mmHG) +----------+--------+-------+----------------+-------------------+ [id number]            Multiphasic, WNL                    +----------+--------+-------+----------------+-------------------+ +---------+--------+--+--------+--+---------+ VertebralPSV cm/s66EDV cm/s20Antegrade +---------+--------+--+--------+--+---------+  Left Carotid Findings: +----------+--------+--------+--------+---------------------+------------------+           PSV cm/sEDV cm/sStenosisPlaque Description   Comments           +----------+--------+--------+--------+---------------------+------------------+ CCA Prox  70      15                                   intimal thickening +----------+--------+--------+--------+---------------------+------------------+  CCA Distal55      17  intimal thickening +----------+--------+--------+--------+---------------------+------------------+ ICA Prox  54      18              focal and                                                                 heterogenous                            +----------+--------+--------+--------+---------------------+------------------+ ICA Distal63      21                                                      +----------+--------+--------+--------+---------------------+------------------+ ECA       68      9                                                       +----------+--------+--------+--------+---------------------+------------------+ +----------+--------+--------+----------------+-------------------+           PSV cm/sEDV cm/sDescribe        Arm Pressure (mmHG) +----------+--------+--------+----------------+-------------------+ [id number]              Multiphasic, WNL                    +----------+--------+--------+----------------+-------------------+ +---------+--------+--+--------+--+---------+ VertebralPSV cm/s41EDV cm/s10Antegrade +---------+--------+--+--------+--+---------+   Summary: Right Carotid: The extracranial vessels were near-normal with only minimal wall                thickening or plaque. Left Carotid: The extracranial vessels were near-normal with only minimal wall               thickening or plaque. Vertebrals:  Bilateral vertebral arteries demonstrate antegrade flow. Subclavians: Normal flow hemodynamics were seen in bilateral subclavian              arteries. *See table(s) above for measurements and observations.  Electronically signed by Antony Contras MD on 08/07/2021 at 12:54:16 PM.    Final     Subjective: Patient feels much improved today. Denies abdominal pain, N/V/D.   Discharge Exam: Vitals:   08/09/21 0405 08/09/21 0751  BP: (!) 146/70 (!) 147/70  Pulse: (!) 56 63  Resp:  19 14  Temp: 98.3 F (36.8 C) 97.9 F (36.6 C)  SpO2: 99% 92%    General: Pt is alert, awake, not in acute distress Cardiovascular: RRR, S1/S2 +, no rubs, no gallops Respiratory: CTA bilaterally, no wheezing, no rhonchi Abdominal: Soft, NT, ND, bowel sounds + Extremities: no edema, no cyanosis  Labs: Basic Metabolic Panel: Recent Labs  Lab 08/03/21 1615 08/06/21 1455 08/06/21 1653 08/06/21 2047 08/07/21 0404 08/08/21 0322 08/09/21 0336  NA 131* 129*  --  124* 131* 132* 131*  K 3.5 4.1  --  3.3* 3.2* 3.2* 3.0*  CL 97* 90*  --   --  97* 96* 97*  CO2 21* 18*  --   --  19* 25 23  GLUCOSE 164*  82  --   --  84 90 94  BUN 14 12  --   --  10 7* <5*  CREATININE 0.96 0.92  --   --  0.84 0.75 0.70  CALCIUM 9.5 10.1  --   --  9.0 8.8* 8.5*  MG 1.2*  --  1.2*  --  1.7  --   --   PHOS 2.3*  --   --   --   --   --   --    CBC: Recent Labs  Lab 08/03/21 1615 08/06/21 1455 08/06/21 2047 08/09/21 0336  WBC 5.5 9.4  --  4.9  NEUTROABS 3.9 8.0*  --   --   HGB 11.9* 13.1 11.9* 11.0*  HCT 34.9* 36.4 35.0* 31.7*  MCV 89.3 85.8  --  88.3  PLT 192 219  --  163    Microbiology Recent Results (from the past 240 hour(s))  Blood culture (routine x 2)     Status: None (Preliminary result)   Collection Time: 08/06/21  2:39 PM   Specimen: BLOOD  Result Value Ref Range Status   Specimen Description BLOOD SITE NOT SPECIFIED  Final   Special Requests   Final    BOTTLES DRAWN AEROBIC AND ANAEROBIC Blood Culture results may not be optimal due to an inadequate volume of blood received in culture bottles   Culture   Final    NO GROWTH 3 DAYS Performed at [facility], [place] [address], [place], [place] [address]    Report Status PENDING  Incomplete  Resp Panel by RT-PCR (Flu A&B, Covid) Nasopharyngeal Swab     Status: None   Collection Time: 08/06/21  3:43 PM   Specimen: Nasopharyngeal Swab; Nasopharyngeal(NP) swabs in vial transport medium  Result Value Ref Range Status   SARS  Coronavirus 2 by RT PCR NEGATIVE NEGATIVE Final    Comment: (NOTE) SARS-CoV-2 target nucleic acids are NOT DETECTED.  The SARS-CoV-2 RNA is generally detectable in upper respiratory specimens during the acute phase of infection. The lowest concentration of SARS-CoV-2 viral copies this assay can detect is 138 copies/mL. A negative result does not preclude SARS-Cov-2 infection and should not be used as the sole basis for treatment or other patient management decisions. A negative result may occur with  improper specimen collection/handling, submission of specimen other than nasopharyngeal swab, presence of viral mutation(s) within the areas targeted by this assay, and inadequate number of viral copies(<138 copies/mL). A negative result must be combined with clinical observations, patient history, and epidemiological information. The expected result is Negative.  Fact Sheet for Patients:  EntrepreneurPulse.com.au  Fact Sheet for Healthcare Providers:  IncredibleEmployment.be  This test is no t yet approved or cleared by the [place] FDA and  has been authorized for detection and/or diagnosis of SARS-CoV-2 by FDA under an Emergency Use Authorization (EUA). This EUA will remain  in effect (meaning this test can be used) for the duration of the COVID-19 declaration under Section 564(b)(1) of the Act, 21 U.S.C.section 360bbb-3(b)(1), unless the authorization is terminated  or revoked sooner.       Influenza A by PCR NEGATIVE NEGATIVE Final   Influenza B by PCR NEGATIVE NEGATIVE Final    Comment: (NOTE) The Xpert Xpress SARS-CoV-2/FLU/RSV plus assay is intended as an aid in the diagnosis of influenza from Nasopharyngeal swab specimens and should not be used as a sole basis for treatment. Nasal washings and aspirates are unacceptable for Xpert Xpress SARS-CoV-2/FLU/RSV testing.  Fact  Sheet for  Patients: EntrepreneurPulse.com.au  Fact Sheet for Healthcare Providers: IncredibleEmployment.be  This test is not yet approved or cleared by the [place] FDA and has been authorized for detection and/or diagnosis of SARS-CoV-2 by FDA under an Emergency Use Authorization (EUA). This EUA will remain in effect (meaning this test can be used) for the duration of the COVID-19 declaration under Section 564(b)(1) of the Act, 21 U.S.C. section 360bbb-3(b)(1), unless the authorization is terminated or revoked.  Performed at [facility], [place] [address] [place], [place] [address]   Blood culture (routine x 2)     Status: None (Preliminary result)   Collection Time: 08/06/21  5:20 PM   Specimen: BLOOD  Result Value Ref Range Status   Specimen Description BLOOD RIGHT FOREARM  Final   Special Requests   Final    BOTTLES DRAWN AEROBIC AND ANAEROBIC Blood Culture results may not be optimal due to an inadequate volume of blood received in culture bottles   Culture   Final    NO GROWTH 3 DAYS Performed at [facility], [place] [address] [place], [place] [address]    Report Status PENDING  Incomplete    Time coordinating discharge: Over 30 minutes  Richarda Osmond, MD  [facility] 08/09/2021, 12:18 PM Pager   If 7PM-7AM, please contact night-coverage [email or web address] Password [email or web address]

## 2021-08-10 ENCOUNTER — Telehealth: Payer: Self-pay | Admitting: Internal Medicine

## 2021-08-10 ENCOUNTER — Encounter (HOSPITAL_COMMUNITY): Payer: Self-pay | Admitting: Cardiology

## 2021-08-10 DIAGNOSIS — G3184 Mild cognitive impairment, so stated: Secondary | ICD-10-CM | POA: Diagnosis not present

## 2021-08-10 DIAGNOSIS — S2242XS Multiple fractures of ribs, left side, sequela: Secondary | ICD-10-CM | POA: Diagnosis not present

## 2021-08-10 DIAGNOSIS — I69311 Memory deficit following cerebral infarction: Secondary | ICD-10-CM | POA: Diagnosis not present

## 2021-08-10 DIAGNOSIS — I712 Thoracic aortic aneurysm, without rupture, unspecified: Secondary | ICD-10-CM | POA: Diagnosis not present

## 2021-08-10 DIAGNOSIS — M47812 Spondylosis without myelopathy or radiculopathy, cervical region: Secondary | ICD-10-CM | POA: Diagnosis not present

## 2021-08-10 DIAGNOSIS — F32A Depression, unspecified: Secondary | ICD-10-CM | POA: Diagnosis not present

## 2021-08-10 DIAGNOSIS — M4800 Spinal stenosis, site unspecified: Secondary | ICD-10-CM | POA: Diagnosis not present

## 2021-08-10 DIAGNOSIS — M1811 Unilateral primary osteoarthritis of first carpometacarpal joint, right hand: Secondary | ICD-10-CM | POA: Diagnosis not present

## 2021-08-10 DIAGNOSIS — E785 Hyperlipidemia, unspecified: Secondary | ICD-10-CM | POA: Diagnosis not present

## 2021-08-10 DIAGNOSIS — G8929 Other chronic pain: Secondary | ICD-10-CM | POA: Diagnosis not present

## 2021-08-10 DIAGNOSIS — F419 Anxiety disorder, unspecified: Secondary | ICD-10-CM | POA: Diagnosis not present

## 2021-08-10 DIAGNOSIS — S42031D Displaced fracture of lateral end of right clavicle, subsequent encounter for fracture with routine healing: Secondary | ICD-10-CM | POA: Diagnosis not present

## 2021-08-10 DIAGNOSIS — I10 Essential (primary) hypertension: Secondary | ICD-10-CM | POA: Diagnosis not present

## 2021-08-10 DIAGNOSIS — M4312 Spondylolisthesis, cervical region: Secondary | ICD-10-CM | POA: Diagnosis not present

## 2021-08-10 DIAGNOSIS — E039 Hypothyroidism, unspecified: Secondary | ICD-10-CM | POA: Diagnosis not present

## 2021-08-10 DIAGNOSIS — J449 Chronic obstructive pulmonary disease, unspecified: Secondary | ICD-10-CM | POA: Diagnosis not present

## 2021-08-10 NOTE — Telephone Encounter (Signed)
FYI  Pt has been scheduled for 10/18 @ 2.40 with PCP for hospital f/u and med questions

## 2021-08-10 NOTE — Telephone Encounter (Signed)
Spoke with patient and she refused her AWV.  She stated that she will talk to you about that at her hospital f/u.

## 2021-08-10 NOTE — Telephone Encounter (Signed)
[facility] called   Patient was discharged yesterday from the hospital. While she was admitted the attending provider d/c a lot of her medications. One of the medications that was d/c Eszopiclone. Patient is still having trouble with her insomnia and feels she needs the medication. Requesting on whether she can resume medication.  Contacted pt's husband to schedule OV or telehealth appt but he declined at this time.    Best contact #: [phone]

## 2021-08-11 ENCOUNTER — Other Ambulatory Visit: Payer: Self-pay | Admitting: Internal Medicine

## 2021-08-11 DIAGNOSIS — J4520 Mild intermittent asthma, uncomplicated: Secondary | ICD-10-CM

## 2021-08-11 LAB — CULTURE, BLOOD (ROUTINE X 2)
Culture: NO GROWTH
Culture: NO GROWTH

## 2021-08-13 ENCOUNTER — Telehealth: Payer: Self-pay

## 2021-08-13 NOTE — Telephone Encounter (Signed)
Transition Care Management Unsuccessful Follow-up Telephone Call  Date of discharge and from where:  08/09/2021 from [facility]  Attempts:  1st Attempt  Reason for unsuccessful TCM follow-up call:  Reason for unsuccessful TCM call  Nurse notes: Patient was scheduled for hospital follow-up on 08/14/2021 by another caller.  This nurse spoke with patient; patient refused to complete TCM call and to schedule a AWV.

## 2021-08-13 NOTE — Telephone Encounter (Signed)
This patient refused to hear anything I had to say.  She told me that she had just had a stroke and that she was not doing any other appts or calls and that she will talk with you at her appt.  Her name did not pull up on my TCM list, plus another NHA was checking my TCMs.

## 2021-08-14 ENCOUNTER — Ambulatory Visit (INDEPENDENT_AMBULATORY_CARE_PROVIDER_SITE_OTHER): Payer: Medicare Other | Admitting: Internal Medicine

## 2021-08-14 ENCOUNTER — Ambulatory Visit: Payer: Medicare Other

## 2021-08-14 ENCOUNTER — Encounter: Payer: Self-pay | Admitting: Internal Medicine

## 2021-08-14 ENCOUNTER — Other Ambulatory Visit: Payer: Self-pay

## 2021-08-14 VITALS — BP 122/80 | HR 74 | Resp 18 | Ht 64.5 in | Wt 132.8 lb

## 2021-08-14 DIAGNOSIS — R911 Solitary pulmonary nodule: Secondary | ICD-10-CM

## 2021-08-14 DIAGNOSIS — G4701 Insomnia due to medical condition: Secondary | ICD-10-CM

## 2021-08-14 DIAGNOSIS — Z23 Encounter for immunization: Secondary | ICD-10-CM | POA: Diagnosis not present

## 2021-08-14 DIAGNOSIS — F3341 Major depressive disorder, recurrent, in partial remission: Secondary | ICD-10-CM | POA: Diagnosis not present

## 2021-08-14 DIAGNOSIS — R0989 Other specified symptoms and signs involving the circulatory and respiratory systems: Secondary | ICD-10-CM

## 2021-08-14 DIAGNOSIS — G8929 Other chronic pain: Secondary | ICD-10-CM | POA: Diagnosis not present

## 2021-08-14 DIAGNOSIS — E876 Hypokalemia: Secondary | ICD-10-CM

## 2021-08-14 DIAGNOSIS — J4520 Mild intermittent asthma, uncomplicated: Secondary | ICD-10-CM

## 2021-08-14 LAB — COMPREHENSIVE METABOLIC PANEL
ALT: 21 U/L (ref 0–35)
AST: 28 U/L (ref 0–37)
Albumin: 4.5 g/dL (ref 3.5–5.2)
Alkaline Phosphatase: 50 U/L (ref 39–117)
BUN: 12 mg/dL (ref 6–23)
CO2: 24 mEq/L (ref 19–32)
Calcium: 9.5 mg/dL (ref 8.4–10.5)
Chloride: 89 mEq/L — ABNORMAL LOW (ref 96–112)
Creatinine, Ser: 0.79 mg/dL (ref 0.40–1.20)
GFR: 72.53 mL/min (ref >60.00)
Glucose, Bld: 88 mg/dL (ref 70–99)
Potassium: 3.7 mEq/L (ref 3.5–5.1)
Sodium: 124 mEq/L — ABNORMAL LOW (ref 135–145)
Total Bilirubin: 0.4 mg/dL (ref 0.2–1.2)
Total Protein: 7 g/dL (ref 6.0–8.3)

## 2021-08-14 LAB — CBC
HCT: 34.6 % — ABNORMAL LOW (ref 36.0–46.0)
Hemoglobin: 12 g/dL (ref 12.0–15.0)
MCHC: 34.6 g/dL (ref 30.0–36.0)
MCV: 90.2 fl (ref 78.0–100.0)
Platelets: 254 10*3/uL (ref 150.0–400.0)
RBC: 3.83 Mil/uL — ABNORMAL LOW (ref 3.87–5.11)
RDW: 15.1 % (ref 11.5–15.5)
WBC: 4.6 10*3/uL (ref 4.0–10.5)

## 2021-08-14 MED ORDER — ALPRAZOLAM 0.5 MG PO TABS: 0.5000 mg | ORAL_TABLET | Freq: Two times a day (BID) | ORAL | 0 refills | Status: DC | PRN

## 2021-08-14 MED ORDER — ALBUTEROL SULFATE HFA 108 (90 BASE) MCG/ACT IN AERS: 2.0000 | INHALATION_SPRAY | Freq: Four times a day (QID) | RESPIRATORY_TRACT | 2 refills | Status: DC | PRN

## 2021-08-14 MED ORDER — ESZOPICLONE 3 MG PO TABS: 3.0000 mg | ORAL_TABLET | Freq: Every day | ORAL | 5 refills | Status: DC

## 2021-08-14 NOTE — Patient Instructions (Addendum)
It is okay to take the singulair (montelukast) and we will wait on the cholesterol medicine.   We have sent in the lunesta for sleep and the xanax to use if needed.   We will get the CT scan of the chest.  We are getting the blood work today.

## 2021-08-14 NOTE — Progress Notes (Signed)
   Subjective:   Patient ID: Marissa [name], female    DOB: 25-Sep-1946, 75 y.o.   MRN: [id number]  HPI The patient is a 75 YO female coming in for hospital follow up. Multiple medications were stopped in the hospital and she is not sleeping. Overall improving. Not eating well.  PMH, [facility], social history reviewed and updated  Review of Systems  Constitutional:  Positive for activity change, appetite change and fatigue.  HENT: Negative.    Eyes: Negative.   Respiratory:  Negative for cough, chest tightness and shortness of breath.   Cardiovascular:  Negative for chest pain, palpitations and leg swelling.  Gastrointestinal:  Negative for abdominal distention, abdominal pain, constipation, diarrhea, nausea and vomiting.  Musculoskeletal: Negative.   Skin: Negative.   Neurological: Negative.   Psychiatric/Behavioral:  Positive for decreased concentration, dysphoric mood and sleep disturbance. The patient is nervous/anxious.    Objective:  Physical Exam Constitutional:      Appearance: She is well-developed. She is ill-appearing.  HENT:     Head: Normocephalic and atraumatic.  Cardiovascular:     Rate and Rhythm: Normal rate and regular rhythm.  Pulmonary:     Effort: Pulmonary effort is normal. No respiratory distress.     Breath sounds: Normal breath sounds. No wheezing or rales.  Abdominal:     General: Bowel sounds are normal. There is no distension.     Palpations: Abdomen is soft.     Tenderness: There is no abdominal tenderness. There is no rebound.  Musculoskeletal:     Cervical back: Normal range of motion.  Skin:    General: Skin is warm and dry.  Neurological:     Mental Status: She is alert and oriented to person, place, and time.     Coordination: Coordination normal.    Vitals:   08/14/21 1439  BP: 122/80  Pulse: 74  Resp: 18  SpO2: 96%  Weight: 132 lb 12.8 oz (60.2 kg)  Height: 5' 4.5" (1.638 m)    This visit occurred during the SARS-CoV-2 public health  emergency.  Safety protocols were in place, including screening questions prior to the visit, additional usage of staff PPE, and extensive cleaning of exam room while observing appropriate contact time as indicated for disinfecting solutions.   Assessment & Plan:  Flu shot given at visit

## 2021-08-17 ENCOUNTER — Encounter: Payer: Self-pay | Admitting: Internal Medicine

## 2021-08-17 DIAGNOSIS — R911 Solitary pulmonary nodule: Secondary | ICD-10-CM | POA: Insufficient documentation

## 2021-08-17 DIAGNOSIS — E871 Hypo-osmolality and hyponatremia: Secondary | ICD-10-CM

## 2021-08-17 NOTE — Assessment & Plan Note (Signed)
BP at goal today and had medication changes in the hospital. Is currently taking bystolic 10 mg daily and amlodipine 10 mg daily.

## 2021-08-17 NOTE — Assessment & Plan Note (Signed)
Levels still low on leaving hospital and needs CMP today. Adjust as needed. Poor appetite since leaving hospital and may need supplementation.

## 2021-08-17 NOTE — Assessment & Plan Note (Signed)
No flare today, uses albuterol prn with good relief.

## 2021-08-17 NOTE — Assessment & Plan Note (Signed)
Taking prozac 40 mg BID and this is helping some. She would like to resume ability to take xanax for panic attacks. Review of [place] database shows rare usage with infrequent fills. Will approve #20 today and reminded about risk of dependence, memory change, falls.

## 2021-08-17 NOTE — Assessment & Plan Note (Signed)
She has had severe sleeping problems in the past and is sleeping no more than 2-3 hours a night since being home from hospital. Will resume lunesta as she has tried and failed many other agents in the past. Rx done today for lunesta 3 mg qhs.

## 2021-08-17 NOTE — Assessment & Plan Note (Signed)
Persistent finding on CXR in the hospital and requires CT scan chest without contrast to rule out lesion/mass. This is ordered today.

## 2021-08-20 ENCOUNTER — Other Ambulatory Visit (INDEPENDENT_AMBULATORY_CARE_PROVIDER_SITE_OTHER): Payer: Medicare Other

## 2021-08-20 ENCOUNTER — Ambulatory Visit: Payer: Medicare Other | Admitting: Cardiology

## 2021-08-20 ENCOUNTER — Other Ambulatory Visit: Payer: Self-pay

## 2021-08-20 ENCOUNTER — Encounter: Payer: Self-pay | Admitting: Cardiology

## 2021-08-20 VITALS — BP 160/80 | HR 55 | Ht 65.0 in | Wt 132.2 lb

## 2021-08-20 DIAGNOSIS — E871 Hypo-osmolality and hyponatremia: Secondary | ICD-10-CM

## 2021-08-20 DIAGNOSIS — E785 Hyperlipidemia, unspecified: Secondary | ICD-10-CM

## 2021-08-20 DIAGNOSIS — R0609 Other forms of dyspnea: Secondary | ICD-10-CM

## 2021-08-20 DIAGNOSIS — I350 Nonrheumatic aortic (valve) stenosis: Secondary | ICD-10-CM

## 2021-08-20 DIAGNOSIS — I639 Cerebral infarction, unspecified: Secondary | ICD-10-CM

## 2021-08-20 DIAGNOSIS — R0989 Other specified symptoms and signs involving the circulatory and respiratory systems: Secondary | ICD-10-CM | POA: Diagnosis not present

## 2021-08-20 DIAGNOSIS — I77810 Thoracic aortic ectasia: Secondary | ICD-10-CM

## 2021-08-20 LAB — BASIC METABOLIC PANEL
BUN: 15 mg/dL (ref 6–23)
CO2: 23 mEq/L (ref 19–32)
Calcium: 9.4 mg/dL (ref 8.4–10.5)
Chloride: 93 mEq/L — ABNORMAL LOW (ref 96–112)
Creatinine, Ser: 0.87 mg/dL (ref 0.40–1.20)
GFR: 64.6 mL/min (ref >60.00)
Glucose, Bld: 81 mg/dL (ref 70–99)
Potassium: 3.7 mEq/L (ref 3.5–5.1)
Sodium: 130 mEq/L — ABNORMAL LOW (ref 135–145)

## 2021-08-20 MED ORDER — NEXLETOL 180 MG PO TABS: 180.0000 mg | ORAL_TABLET | Freq: Every day | ORAL | 3 refills | Status: DC

## 2021-08-20 MED ORDER — CLONIDINE HCL 0.2 MG PO TABS: ORAL_TABLET | ORAL | 6 refills | Status: DC

## 2021-08-20 NOTE — Progress Notes (Signed)
Primary Care Provider: [name], MD Cardiologist: [name], MD Electrophysiologist: None  Clinic Note: Chief Complaint  Patient presents with   Hospitalization Follow-up    Recent stroke. - residual Sx = word finding/ aphasia. dizziness   ===================================  ASSESSMENT/PLAN   Problem List Items Addressed This Visit       Cardiology Problems   Labile hypertension (Chronic)   Relevant Medications   Bempedoic Acid (NEXLETOL) 180 MG TABS   cloNIDine (CATAPRES) 0.2 MG tablet   Mild aortic stenosis - Primary (Chronic)   Relevant Medications   Bempedoic Acid (NEXLETOL) 180 MG TABS   cloNIDine (CATAPRES) 0.2 MG tablet   Hyperlipidemia with target LDL less than 100 (Chronic)   Relevant Medications   Bempedoic Acid (NEXLETOL) 180 MG TABS   cloNIDine (CATAPRES) 0.2 MG tablet     Other   Dyspnea on exertion (Chronic)    ===================================  HPI:    Marissa [name] is a 75 y.o. female with a PMH notable for Labile Hypertension and Mild Aortic Dilation/Mild Aortic Stenosis who presents today for hospital/post STROKE follow-up.  Marissa [name] was last seen on December 21, 2020 -> on amlodipine and Bystolic for blood pressure control along with twice daily clonidine. ->  Had significantly in the exam starting in the morning. Attempted starting Nexletol 100 mg daily.  Plan lipid follow-up in 4 months. Rec: use clonidine as needed for sustained blood pressure greater than 120 mmHg for more than 1 hour.  Recent Hospitalizations:  10/10-13/2022: Admitted for altered mental status.  Thought to be related to memantine, but was found to have acute stroke (presumably thromboembolic) .  Aspirin and Plavix for 90 days and on aspirin Loop recorder placed on day of discharge.  Reviewed  CV studies:    The following studies were reviewed today: (if available, images/films reviewed: From Epic Chart or Care Everywhere) TTE 08/08/2021:  EF 60 to 65%.  Normal diastolic parameters?.   ?  Functional bicuspid aortic valve with R&L cusp sommisure.  Very sclerotic.  Stable gradients ~mean gradient 10 mmHg. Very Mild stenosis. Aorta dilated to 42 mm.  Stable.  RAP 3 mmHg  Carotid Dopplers 08/07/2021: Near normal carotids as well as normal vertebral and subclavian arteries.  Interval History:   Marissa [name] returns here today overall starting to get a little bit better.  Starting to gain some confidence back.  She is more steady on her gait.  She has some mild aphasia and word finding difficulty but no musculoskeletal weakness.  She has not noted any irregular heartbeats or palpitations.  She has had up-and-down swings of blood pressure levels as high as 190s but as low as 100s.  For the most part the heart rates been pretty stable.  She does have some dizziness, but has not had any syncope or near syncope.  No further TIA or amaurosis fugax symptoms.  There is also concern about hyponatremia-most recent check was 124.  CV Review of Symptoms (Summary) Cardiovascular ROS: positive for - dyspnea on exertion, irregular heartbeat, and -dizziness, mild fatigue, some aphasia and word finding issues (post-stroke).. negative for - chest pain, edema, orthopnea, paroxysmal nocturnal dyspnea, rapid heart rate, shortness of breath, or syncope/near syncope, recurrent TIA or hours.  Symptoms.  Claudication.  REVIEWED OF SYSTEMS   Review of Systems  Constitutional:  Positive for malaise/fatigue. Negative for fever and weight loss.  HENT:  Negative for nosebleeds.   Gastrointestinal:  Negative for blood  in stool and melena.  Genitourinary:  Negative for dysuria and hematuria.  Musculoskeletal:  Positive for joint pain. Negative for falls.  Neurological:  Positive for dizziness. Negative for weakness.       Aphasia and word finding issues.  Somewhat unsteady gait/poor balance.  Psychiatric/Behavioral:  Positive for memory loss. The patient is  nervous/anxious.    I have reviewed and (if needed) personally updated the patient's problem list, medications, allergies, past medical and surgical history, social and family history.   PAST MEDICAL HISTORY   Past Medical History:  Diagnosis Date   Arthritis    Asthma    Bicuspid aortic valve 05/2017   Likely functional bicuspid aortic valve with sclerosis and no stenosis.   Bursitis    Cataract    mild   Dysthymia    Hemorrhoids    Hx of ulcerative colitis    per dr Arnoldo Morale as per pt.   Hyperlipidemia    Labile hypertension    managed - labile.   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 Roxan Hockey)   Thyroid disease     PAST SURGICAL HISTORY   Past Surgical History:  Procedure Laterality Date   ABDOMINAL HYSTERECTOMY     BREAST EXCISIONAL BIOPSY Left    BUNIONECTOMY     Cardiac Event Monitor  07/2017   Overall relatively normal.  Normal sinus rhythm with rare bradycardia and tachycardia.  Heart rate ranged from 55-110 bpm.  Occasional PACs and PVCs, every single 1 was felt.  No arrhythmias other than one short 4 beat run of PACs.   COLONOSCOPY  02/04/2005   all normal    FOOT SURGERY     3 pins in toes    HAND SURGERY     left thumb joint resection   LOOP RECORDER INSERTION N/A 08/09/2021   Procedure: LOOP RECORDER INSERTION;  Surgeon: [name], MD;  Location: [facility];  Service: Cardiovascular;  Laterality: N/A;   nasal revision     NM MYOVIEW LTD  07/2017   LOW RISK study.  No ischemia or infarction.  EF greater than 65%.    SINUS IRRIGATION     TONSILLECTOMY AND ADENOIDECTOMY     TRANSTHORACIC ECHOCARDIOGRAM  06/'1, 8/'19   a) Moderate LVH. Normal EF 60-65%. Normal diastolic parameters.  --> Difficult to fully visualize the aortic valve. Cannot exclude bicuspid valve. Mild aortic stenosis noted. No PFO. Mildly dilated left atrium. Trivial MR. No comment on mitral valve prolapse. Moderately dilated ascending aorta.;; b) F/u Echo To evaluate the aortic valve. -- Bicuspid AoV - mildly thickened / calcified. - No stenosis   TRANSTHORACIC ECHOCARDIOGRAM  08/08/2021   EF 60 to 65%.  Normal diastolic parameters?.   ?  Functional bicuspid aortic valve with R&L cusp sommisure.  Very sclerotic.  Stable gradients ~mean gradient 10 mmHg. Very Mild stenosis. Aorta dilated to 42 mm.  Stable.  RAP 3 mmHg   TRANSTHORACIC ECHOCARDIOGRAM  08/15/2020   EF 60 to 65%.  No R WMA.  GRII DD.  Mild Aortic Stenosis (mean gradient 10 mmHg).  Moderate to severe aortic root dilation (45 mm) normal IVC.  (Stable compared to February  2020)     Immunization History  Administered Date(s) Administered   Fluad Quad(high Dose 65+) 07/14/2020, 08/14/2021   Hep A / Hep B 02/24/2018, 06/25/2018   Hepatitis B, adult 03/26/2018   Influenza Split 09/12/2011, 08/19/2012   Influenza Whole 08/20/2007, 07/29/2008, 08/07/2009, 07/25/2010   Influenza, High Dose Seasonal PF 07/09/2016, 07/14/2017, 07/24/2018   Influenza,inj,Quad PF,6+ Mos 07/19/2013, 08/16/2014, 07/13/2015   Influenza-Unspecified 07/15/2019   PFIZER(Purple Top)SARS-COV-2 Vaccination 11/17/2019, 12/08/2019   Pneumococcal Conjugate-13 01/06/2015   Pneumococcal Polysaccharide-23 07/19/2013   Tdap 07/29/2011   Zoster, Live 10/28/2012    MEDICATIONS/ALLERGIES   Current Meds  Medication Sig   albuterol (VENTOLIN HFA) 108 (90 Base) MCG/ACT inhaler Inhale 2 puffs into the lungs every 6 (six) hours as needed for wheezing or shortness of breath.   ALPRAZolam (XANAX) 0.5 MG tablet Take 1 tablet (0.5 mg total) by mouth 2 (two) times daily as needed for anxiety.   amLODipine (NORVASC) 10 MG tablet TAKE 1 TABLET(10 MG) BY MOUTH DAILY   aspirin EC 81 MG tablet Take  1 tablet (81 mg total) by mouth daily. Swallow whole.   Bempedoic Acid (NEXLETOL) 180 MG TABS Take 180 mg by mouth daily at 12 noon.   carisoprodol (SOMA) 350 MG tablet TAKE 1 TABLET(350 MG) BY MOUTH THREE TIMES DAILY AS NEEDED FOR MUSCLE SPASMS   cloNIDine (CATAPRES) 0.2 MG tablet Take 0.1 mg by mouth 2 (two) times daily as needed (systolic over 154). if blood pressure is great than 008 systolic for more than an hour   clopidogrel (PLAVIX) 75 MG tablet Take 1 tablet (75 mg total) by mouth daily.   Eszopiclone 3 MG TABS Take 1 tablet (3 mg total) by mouth at bedtime. Take immediately before bedtime   ezetimibe (ZETIA) 10 MG tablet TAKE 1 TABLET(10 MG) BY MOUTH DAILY   famotidine (PEPCID) 20 MG tablet Take 20 mg by mouth as needed for heartburn or indigestion.   feeding supplement, GLUCERNA SHAKE, (GLUCERNA SHAKE) LIQD Take 237 mLs by mouth daily as needed (if not eating meals).   levothyroxine (SYNTHROID) 125 MCG tablet TAKE 1 TABLET(125 MCG) BY MOUTH DAILY   montelukast (SINGULAIR) 10 MG tablet Take 10 mg by mouth at bedtime.   MYRBETRIQ 50 MG TB24 tablet TAKE 1 TABLET(50 MG) BY MOUTH DAILY   nebivolol (BYSTOLIC) 10 MG tablet TAKE 1 TABLET(10 MG) BY MOUTH DAILY AT 8 PM   Respiratory Therapy Supplies (FLUTTER) DEVI Use as directed    Allergies  Allergen Reactions   Fentanyl Shortness Of Breath and Rash   Rosuvastatin Anaphylaxis, Anxiety, Hives, Itching, Nausea And Vomiting, Other (See Comments), Palpitations, Photosensitivity and Shortness Of Breath   Amoxicillin Diarrhea    Severe diarrhea, rash to vaginal area with swelling    Hctz [Hydrochlorothiazide] Other (See Comments)    HypoNatremia   Hydralazine     Weak, sweats, red skin   Codeine Other (See Comments)    makes her hyper   Conjugated Estrogens Itching and Rash   Erythromycin Rash    had a rash with emycin, has done ok with other meds in it's class   Piroxicam Itching and Rash    Feldene    SOCIAL HISTORY/FAMILY HISTORY    Reviewed in Epic:  Pertinent findings:  Social History   Tobacco Use   Smoking status: Never   Smokeless tobacco: Never  Vaping Use   Vaping Use: Never used  Substance Use Topics   Alcohol use: Yes    Comment: occ  Drug use: No   Social History   Social History Narrative   Right handed    Lives with husband     OBJCTIVE -PE, EKG, labs   Wt Readings from Last 3 Encounters:  08/20/21 132 lb 3.2 oz (60 kg)  08/14/21 132 lb 12.8 oz (60.2 kg)  08/06/21 132 lb (59.9 kg)    Physical Exam: BP (!) 160/80   Pulse (!) 55   Ht 5' 5"  (1.651 m)   Wt 132 lb 3.2 oz (60 kg)   SpO2 99%   BMI 22.00 kg/m  Physical Exam Vitals reviewed.  Constitutional:      General: She is not in acute distress.    Appearance: Normal appearance. She is normal weight. She is not toxic-appearing.     Comments: Well-nourished, well-groomed  HENT:     Head: Normocephalic and atraumatic.  Neck:     Vascular: No carotid bruit (Radiated aortic murmur), hepatojugular reflux or JVD.  Cardiovascular:     Rate and Rhythm: Bradycardia present. No extrasystoles are present.    Chest Wall: PMI is not displaced.     Pulses: Normal pulses.     Heart sounds: S1 normal and S2 normal. Murmur heard.  Harsh crescendo-decrescendo midsystolic murmur is present with a grade of 2/6 at the upper right sternal border radiating to the neck.    No friction rub. Gallop present. S4 sounds present.  Pulmonary:     Effort: Pulmonary effort is normal. No respiratory distress.     Breath sounds: Normal breath sounds. No wheezing, rhonchi or rales.  Musculoskeletal:        General: No swelling. Normal range of motion.     Cervical back: Normal range of motion and neck supple.  Skin:    General: Skin is warm and dry.  Neurological:     General: No focal deficit present.     Mental Status: She is alert and oriented to person, place, and time.     Motor: No weakness.     Gait: Gait normal.     Comments: Mild word  finding issues.  Psychiatric:        Mood and Affect: Mood normal.        Behavior: Behavior normal.        Thought Content: Thought content normal.        Judgment: Judgment normal.     Comments: Quite anxious.     Adult ECG Report  N/a   Recent Labs: Reviewed Lab Results  Component Value Date   CHOL 250 (H) 08/07/2021   HDL 111 08/07/2021   LDLCALC 109 (H) 08/07/2021   LDLDIRECT 146.0 11/21/2020   TRIG 150 (H) 08/07/2021   CHOLHDL 2.3 08/07/2021   Lab Results  Component Value Date   CREATININE 0.87 08/20/2021   BUN 15 08/20/2021   NA 130 (L) 08/20/2021   K 3.7 08/20/2021   CL 93 (L) 08/20/2021   CO2 23 08/20/2021    CBC Latest Ref Rng & Units 08/14/2021 08/09/2021 08/06/2021  WBC 4.0 - 10.5 K/uL 4.6 4.9 -  Hemoglobin 12.0 - 15.0 g/dL 12.0 11.0(L) 11.9(L)  Hematocrit 36.0 - 46.0 % 34.6(L) 31.7(L) 35.0(L)  Platelets 150.0 - 400.0 K/uL 254.0 163 -    Lab Results  Component Value Date   HGBA1C 5.1 08/07/2021   Lab Results  Component Value Date   TSH 3.084 08/09/2021    ==================================================  COVID-19 Education: The signs and symptoms of COVID-19 were discussed with the patient  and how to seek care for testing (follow up with PCP or arrange E-visit).    I spent a total of 46 minutes with the patient spent in direct patient consultation.  Additional time spent with chart review  / charting (studies, outside notes, etc): 28 min Total Time: 74 min  Current medicines are reviewed at length with the patient today.  (+/- concerns) was told to stop Nexletol on d/c ??   This visit occurred during the SARS-CoV-2 public health emergency.  Safety protocols were in place, including screening questions prior to the visit, additional usage of staff PPE, and extensive cleaning of exam room while observing appropriate contact time as indicated for disinfecting solutions.  Notice: This dictation was prepared with Dragon dictation along with smart  phrase technology. Any transcriptional errors that result from this process are unintentional and may not be corrected upon review.  Patient Instructions / Medication Changes & Studies & Tests Ordered   Patient Instructions  Medication Instructions:   Restart Nexletol 180 mg one tablet daily    Decrease water intake  Caution using  Clonidine  - use as needed if blood pressure systolic blood pressure is greater or equal to 160 /? After rechecking it one hour  from the elevation  *If you need a refill on your cardiac medications before your next appointment, please call your pharmacy*   Lab Work:  Not needed   Testing/Procedures: Not needed   Follow-Up: At [facility], you and your health needs are our priority.  As part of our continuing mission to provide you with exceptional heart care, we have created designated Provider Care Teams.  These Care Teams include your primary Cardiologist (physician) and Advanced Practice Providers (APPs -  Physician Assistants and Nurse Practitioners) who all work together to provide you with the care you need, when you need it.     Your next appointment:   4 or 5 month(s)  The format for your next appointment:   In Person  Provider:   [name], MD     Studies Ordered:   No orders of the defined types were placed in this encounter.    [name], M.D., M.S. Interventional Cardiologist   Pager # [phone] Phone # [phone] [address] [place], [place] [address]   Thank you for choosing Heartcare at [facility]!!

## 2021-08-20 NOTE — Patient Instructions (Addendum)
Medication Instructions:   Restart Nexletol 180 mg one tablet daily    Decrease water intake  Caution using  Clonidine  - use as needed if blood pressure systolic blood pressure is greater or equal to 160 /? After rechecking it one hour  from the elevation  *If you need a refill on your cardiac medications before your next appointment, please call your pharmacy*   Lab Work:  Not needed   Testing/Procedures: Not needed   Follow-Up: At [facility], you and your health needs are our priority.  As part of our continuing mission to provide you with exceptional heart care, we have created designated Provider Care Teams.  These Care Teams include your primary Cardiologist (physician) and Advanced Practice Providers (APPs -  Physician Assistants and Nurse Practitioners) who all work together to provide you with the care you need, when you need it.     Your next appointment:   4 or 5 month(s)  The format for your next appointment:   In Person  Provider:   [name], MD

## 2021-08-22 ENCOUNTER — Telehealth: Payer: Self-pay

## 2021-08-22 ENCOUNTER — Ambulatory Visit (INDEPENDENT_AMBULATORY_CARE_PROVIDER_SITE_OTHER): Payer: Medicare Other

## 2021-08-22 ENCOUNTER — Other Ambulatory Visit: Payer: Self-pay

## 2021-08-22 DIAGNOSIS — I6389 Other cerebral infarction: Secondary | ICD-10-CM

## 2021-08-22 NOTE — Telephone Encounter (Signed)
I spoke with the patient to see if she would like a bedside monitor instead of the app. I let her know all she has to do is plug it up. The patient agreed to the bedside monitor. I told her it will take 7-10 business days for it to arrive. The patient thanked me for my help.  Monitor ordered 08/22/2021.

## 2021-08-22 NOTE — Patient Instructions (Signed)
[employer] [phone]  Please call if you have any redness, swelling, drainage, fever or chills. Also, please call [employer] to have help with setting up your app and possibly resetting your password.  Phone number above. Call the device clinic if you have any further questions or concerns.

## 2021-08-22 NOTE — Progress Notes (Signed)
ILR wound check in clinic. Steri strips removed. Wound well healed. Home monitor transmitting nightly. No episodes. Questions answered.

## 2021-08-22 NOTE — Telephone Encounter (Signed)
Patient seen in device clinic for ILR 2 wound check.  Patients app on phone says to check email to finish setting up.  Patient only has email on tablet which is not with patient today. Her husband stated he attempted to enter password and password would not work. Says he went back to check and was unable to find email. Monitor did connect as of 08/22/21, unsure if that connection was on patients phone, tablet or husbands phone. Husband stated he had the app downloaded on his phone as well. Explained to make sure the app stays downloaded on patients phone as it can only connect to 1 device.   [employer] phone number given to husband to call to have them assist with email. [phone].  Advised I will send to our remote specialist to follow up.  Direct Phone number provided to device clinic if he has further questions or concerns.

## 2021-08-23 ENCOUNTER — Other Ambulatory Visit: Payer: Self-pay | Admitting: Neurology

## 2021-08-27 ENCOUNTER — Encounter: Payer: Self-pay | Admitting: Cardiology

## 2021-08-27 DIAGNOSIS — R0989 Other specified symptoms and signs involving the circulatory and respiratory systems: Secondary | ICD-10-CM | POA: Diagnosis not present

## 2021-08-27 DIAGNOSIS — F3341 Major depressive disorder, recurrent, in partial remission: Secondary | ICD-10-CM | POA: Diagnosis not present

## 2021-08-27 NOTE — Assessment & Plan Note (Signed)
Due for labs to be checked.  On review from today, sodium is up to 130.  This is better than the 124 on previous check.

## 2021-08-27 NOTE — Assessment & Plan Note (Signed)
Occult stroke.  Unclear of true etiology.  Loop recorder placed. Echo and carotid Dopplers nonrevealing.  No symptoms to suggest arrhythmia. Was discharged on aspirin and Plavix.  She has been on ezetimibe, but Nexletol was stopped.  I would restart Nexletol.  She has been intolerant of statins  Lipids were not able to be controlled adequately, Will Consider Referral to [facility] ([facility]) Lipid Clinic to consider PCSK9 inhibitor or inclisiran in light of her recent stroke.[name] Kitchen

## 2021-08-27 NOTE — Assessment & Plan Note (Signed)
Stable on echo at 4.2 cm.  Should be due for upcoming MRA soon.  Need to continue to control blood pressure, however she does have labile pressures with quite symptomatic episodes of orthostatic hypotension when overtreated.

## 2021-08-27 NOTE — Assessment & Plan Note (Signed)
She now has had a follow-up echocardiogram checked because of her stroke.  Valve was stable.  Minimal gradients.  Would not recheck another echo for another 3 years.

## 2021-08-27 NOTE — Assessment & Plan Note (Signed)
She had had a very notable improvement in her lipids with the addition of Nexletol to her ezetimibe.  LDL dropped from 146-109, still not at goal.  She has been intolerant of any statins.  She also had issues with PCSK9 hitters because of issues with giving herself a shot.  Perhaps inclisiran would be an option. . Plan: Restart Nexletol-reassess labs in about 4 months at follow-up visit.[name] Kitchen

## 2021-08-27 NOTE — Assessment & Plan Note (Signed)
Her blood pressure is high today, but she has had ups and downs.  I think this is about the max that I would want her to be.  She has PRN clonidine at home.  This would be for pressures greater than equal to 160 after rechecking 1 hour afterwards.  She is on amlodipine and Bystolic.  With heart rate of 55, really cannot increase the Bystolic dose.  She has not been doing well with any other medications.  With her recent stroke, I am inclined to let her have a little bit of permissive hypertension.  Interestingly, her blood pressure was at goal at PCPs office.

## 2021-08-28 ENCOUNTER — Telehealth: Payer: Self-pay

## 2021-08-28 NOTE — Telephone Encounter (Signed)
LINQ alert received.  2 new AF episodes, longest duration 3hrs, 19mn, HR's 90's Burden 0.8%, DAPT, no OAC ILR indication cryptogenic CVA Route to triage  Reviewed EGM w/ Dr. TLovena Le agreeable AF noted. Patient advised AF noted and Dr. TLovena Lewould like to send referral [facility] Patient agreeable.  Reports of increased fatigue and palpitations at times. States she doesn't feel like she has recovered from her stroke. Advised to call back with further questions or concerns.

## 2021-08-29 NOTE — Telephone Encounter (Signed)
Called and spoke with patient and spouse.  They are agreeable to appt 08/31/21 @10  am with [facility]; directions and phone number for Clinic provided.

## 2021-08-31 ENCOUNTER — Ambulatory Visit (HOSPITAL_COMMUNITY)
Admission: RE | Admit: 2021-08-31 | Discharge: 2021-08-31 | Disposition: A | Discharge status: Home / Self Care | Payer: Medicare Other | Source: Ambulatory Visit | Attending: Physician Assistant | Admitting: Physician Assistant

## 2021-08-31 ENCOUNTER — Encounter: Payer: Medicare Other | Admitting: Speech Pathology

## 2021-08-31 ENCOUNTER — Ambulatory Visit: Payer: Medicare Other | Admitting: Physical Therapy

## 2021-08-31 ENCOUNTER — Encounter (HOSPITAL_COMMUNITY): Payer: Self-pay | Admitting: Physician Assistant

## 2021-08-31 ENCOUNTER — Other Ambulatory Visit: Payer: Self-pay

## 2021-08-31 VITALS — BP 156/74 | HR 59 | Ht 65.0 in | Wt 133.0 lb

## 2021-08-31 DIAGNOSIS — Z9181 History of falling: Secondary | ICD-10-CM | POA: Insufficient documentation

## 2021-08-31 DIAGNOSIS — I10 Essential (primary) hypertension: Secondary | ICD-10-CM | POA: Diagnosis not present

## 2021-08-31 DIAGNOSIS — Z7901 Long term (current) use of anticoagulants: Secondary | ICD-10-CM | POA: Insufficient documentation

## 2021-08-31 DIAGNOSIS — Z8673 Personal history of transient ischemic attack (TIA), and cerebral infarction without residual deficits: Secondary | ICD-10-CM | POA: Insufficient documentation

## 2021-08-31 DIAGNOSIS — M25519 Pain in unspecified shoulder: Secondary | ICD-10-CM | POA: Diagnosis not present

## 2021-08-31 DIAGNOSIS — I480 Paroxysmal atrial fibrillation: Secondary | ICD-10-CM | POA: Diagnosis not present

## 2021-08-31 DIAGNOSIS — I350 Nonrheumatic aortic (valve) stenosis: Secondary | ICD-10-CM | POA: Diagnosis not present

## 2021-08-31 DIAGNOSIS — D6869 Other thrombophilia: Secondary | ICD-10-CM | POA: Insufficient documentation

## 2021-08-31 DIAGNOSIS — I4819 Other persistent atrial fibrillation: Secondary | ICD-10-CM | POA: Insufficient documentation

## 2021-08-31 DIAGNOSIS — M25539 Pain in unspecified wrist: Secondary | ICD-10-CM | POA: Insufficient documentation

## 2021-08-31 DIAGNOSIS — E785 Hyperlipidemia, unspecified: Secondary | ICD-10-CM | POA: Diagnosis not present

## 2021-08-31 MED ORDER — APIXABAN 5 MG PO TABS: 5.0000 mg | ORAL_TABLET | Freq: Two times a day (BID) | ORAL | 3 refills | Status: DC

## 2021-08-31 NOTE — Progress Notes (Signed)
Primary Care Physician: [name], MD Primary Cardiologist: Dr Ellyn Hack  Primary Electrophysiologist: Dr [name] Referring Physician: Dr Vicente Serene is a 75 y.o. female with a history of HTN, aortic stenosis, HLD, prior CVA, atrial fibrillation who presents for follow up in the [facility].  The patient was initially diagnosed with atrial fibrillation 08/28/21 on ILR which was placed for cryptogenic stroke on 08/09/21. There were two afib episodes detected with the longest being 3 hours. Patient has a CHADS2VASC score of 6. Patient was unaware of her arrhythmia. She does have some wrist and shoulder pain from her previous falls.   Today, she denies symptoms of palpitations, chest pain, shortness of breath, orthopnea, PND, lower extremity edema, dizziness, presyncope, syncope, snoring, daytime somnolence, bleeding, or neurologic sequela. The patient is tolerating medications without difficulties and is otherwise without complaint today.    Atrial Fibrillation Risk Factors:  she does not have symptoms or diagnosis of sleep apnea. she does not have a history of rheumatic fever.   she has a BMI of Body mass index is 22.13 kg/m.[name] Kitchen Filed Weights   08/31/21 0955  Weight: 60.3 kg    Family History  Problem Relation Age of Onset   Arthritis Mother    Leukemia Father    Heart disease Maternal Uncle    Diabetes Paternal Aunt    Breast cancer Maternal Grandmother    Heart disease Maternal Grandfather    Breast cancer Paternal Grandmother    Colon cancer Neg Hx      Atrial Fibrillation Management history:  Previous antiarrhythmic drugs: none Previous cardioversions: none Previous ablations: none CHADS2VASC score: 6 Anticoagulation history: none   Past Medical History:  Diagnosis Date   Arthritis    Asthma    Bicuspid aortic valve 05/2017   Likely functional bicuspid aortic valve with sclerosis and no stenosis.   Bursitis     Cataract    mild   Dysthymia    Hemorrhoids    Hx of ulcerative colitis    per dr Arnoldo Morale as per pt.   Hyperlipidemia    Labile hypertension    managed - labile.   Migraine    Scoliosis    Slurred speech    temporal lobe area that is not a tumor causes occ slurred speech and inability to communicate/ words will not come out at the correct time   Spinal stenosis    Stroke ([place]) 08/06/2021   Thoracic aortic aneurysm    Stable 4.2-4.3 cm (followed by Dr. Roxan Hockey)   Thyroid disease    Past Surgical History:  Procedure Laterality Date   ABDOMINAL HYSTERECTOMY     BREAST EXCISIONAL BIOPSY Left    BUNIONECTOMY     Cardiac Event Monitor  07/2017   Overall relatively normal.  Normal sinus rhythm with rare bradycardia and tachycardia.  Heart rate ranged from 55-110 bpm.  Occasional PACs and PVCs, every single 1 was felt.  No arrhythmias other than one short 4 beat run of PACs.   COLONOSCOPY  02/04/2005   all normal    FOOT SURGERY     3 pins in toes    HAND SURGERY     left thumb joint resection   LOOP RECORDER INSERTION N/A 08/09/2021   Procedure: LOOP RECORDER INSERTION;  Surgeon: [name], MD;  Location: [facility];  Service: Cardiovascular;  Laterality: N/A;   nasal revision     NM MYOVIEW LTD  07/2017   LOW RISK study.  No ischemia or infarction.  EF greater than 65%.    SINUS IRRIGATION     TONSILLECTOMY AND ADENOIDECTOMY     TRANSTHORACIC ECHOCARDIOGRAM  06/'1, 8/'19   a) Moderate LVH. Normal EF 60-65%. Normal diastolic parameters. --> Difficult to fully visualize the aortic valve. Cannot exclude bicuspid valve. Mild aortic stenosis noted. No PFO. Mildly dilated left atrium. Trivial MR. No comment on mitral valve prolapse. Moderately dilated ascending aorta.;; b) F/u Echo To evaluate the aortic valve. -- Bicuspid AoV - mildly thickened / calcified. - No stenosis   TRANSTHORACIC ECHOCARDIOGRAM  08/08/2021   EF 60 to 65%.  Normal diastolic parameters?.    ?  Functional bicuspid aortic valve with R&L cusp sommisure.  Very sclerotic.  Stable gradients ~mean gradient 10 mmHg. Very Mild stenosis. Aorta dilated to 42 mm.  Stable.  RAP 3 mmHg   TRANSTHORACIC ECHOCARDIOGRAM  08/15/2020   EF 60 to 65%.  No R WMA.  GRII DD.  Mild Aortic Stenosis (mean gradient 10 mmHg).  Moderate to severe aortic root dilation (45 mm) normal IVC.  (Stable compared to February 2020)     Current Outpatient Medications  Medication Sig Dispense Refill   albuterol (VENTOLIN HFA) 108 (90 Base) MCG/ACT inhaler Inhale 2 puffs into the lungs every 6 (six) hours as needed for wheezing or shortness of breath. 8 g 2   ALPRAZolam (XANAX) 0.5 MG tablet Take 1 tablet (0.5 mg total) by mouth 2 (two) times daily as needed for anxiety. 20 tablet 0   amLODipine (NORVASC) 10 MG tablet TAKE 1 TABLET(10 MG) BY MOUTH DAILY 90 tablet 0   apixaban (ELIQUIS) 5 MG TABS tablet Take 1 tablet (5 mg total) by mouth 2 (two) times daily. 60 tablet 3   Bempedoic Acid (NEXLETOL) 180 MG TABS Take 180 mg by mouth daily at 12 noon. 90 tablet 3   carisoprodol (SOMA) 350 MG tablet TAKE 1 TABLET(350 MG) BY MOUTH THREE TIMES DAILY AS NEEDED FOR MUSCLE SPASMS 60 tablet 2   cloNIDine (CATAPRES) 0.2 MG tablet Take 0.1 mg by mouth 2 (two) times daily as needed (systolic over 696). if blood pressure is great than 295 systolic for more than an hour 20 tablet 6   Eszopiclone 3 MG TABS Take 1 tablet (3 mg total) by mouth at bedtime. Take immediately before bedtime 30 tablet 5   ezetimibe (ZETIA) 10 MG tablet TAKE 1 TABLET(10 MG) BY MOUTH DAILY 90 tablet 3   famotidine (PEPCID) 20 MG tablet Take 20 mg by mouth as needed for heartburn or indigestion.     feeding supplement, GLUCERNA SHAKE, (GLUCERNA SHAKE) LIQD Take 237 mLs by mouth daily as needed (if not eating meals).     FLUoxetine (PROZAC) 40 MG capsule TAKE 1 CAPSULE(40 MG) BY MOUTH TWICE DAILY 180 capsule 0   levalbuterol (XOPENEX HFA) 45 MCG/ACT inhaler SMARTSIG:1-2  Puff(s) Via Inhaler Every 8 Hours PRN     levothyroxine (SYNTHROID) 125 MCG tablet TAKE 1 TABLET(125 MCG) BY MOUTH DAILY 90 tablet 1   montelukast (SINGULAIR) 10 MG tablet Take 10 mg by mouth at bedtime.     MYRBETRIQ 50 MG TB24 tablet TAKE 1 TABLET(50 MG) BY MOUTH DAILY 90 tablet 1   nebivolol (BYSTOLIC) 10 MG tablet TAKE 1 TABLET(10 MG) BY MOUTH DAILY AT 8 PM 90 tablet 0   Respiratory Therapy Supplies (FLUTTER) DEVI Use as directed 1 each 0   No current facility-administered medications for this encounter.  Allergies  Allergen Reactions   Fentanyl Shortness Of Breath and Rash   Rosuvastatin Anaphylaxis, Anxiety, Hives, Itching, Nausea And Vomiting, Other (See Comments), Palpitations, Photosensitivity and Shortness Of Breath   Amoxicillin Diarrhea    Severe diarrhea, rash to vaginal area with swelling    Hctz [Hydrochlorothiazide] Other (See Comments)    HypoNatremia   Hydralazine     Weak, sweats, red skin   Codeine Other (See Comments)    makes her hyper   Conjugated Estrogens Itching and Rash   Erythromycin Rash    had a rash with emycin, has done ok with other meds in it's class   Piroxicam Itching and Rash    Feldene    Social History   Socioeconomic History   Marital status: Married    Spouse name: Not on file   Number of children: 2   Years of education: Not on file   Highest education level: Not on file  Occupational History   Occupation: retired  Tobacco Use   Smoking status: Never   Smokeless tobacco: Never  Vaping Use   Vaping Use: Never used  Substance and Sexual Activity   Alcohol use: Yes    Alcohol/week: 7.0 standard drinks    Types: 7 Shots of liquor per week    Comment: 1 drink daily club soda with burbon 08/31/2021   Drug use: No   Sexual activity: Not Currently  Other Topics Concern   Not on file  Social History Narrative   Right handed    Lives with husband    Social Determinants of Health   Financial Resource Strain: Not on file   Food Insecurity: Not on file  Transportation Needs: Not on file  Physical Activity: Not on file  Stress: Not on file  Social Connections: Not on file  Intimate Partner Violence: Not on file     ROS- All systems are reviewed and negative except as per the HPI above.  Physical Exam: Vitals:   08/31/21 0955  BP: (!) 156/74  Pulse: (!) 59  Weight: 60.3 kg  Height: 5' 5"  (1.651 m)    GEN- The patient is a well appearing elderly female, alert and oriented x 3 today.   Head- normocephalic, atraumatic Eyes-  Sclera clear, conjunctiva pink Ears- hearing intact Oropharynx- clear Neck- supple  Lungs- Clear to ausculation bilaterally, normal work of breathing Heart- Regular rate and rhythm, no rubs, 2/6 systolic murmur   GI- soft, NT, ND, + BS Extremities- no clubbing, cyanosis, or edema MS- no significant deformity or atrophy Skin- no rash or lesion Psych-  full affect, anxious  Neuro- strength and sensation are intact  Wt Readings from Last 3 Encounters:  08/31/21 60.3 kg  08/20/21 60 kg  08/14/21 60.2 kg    EKG today demonstrates  SB Vent. rate 59 BPM PR interval 180 ms QRS duration 82 ms QT/QTcB 476/471 ms  Echo 08/08/21 demonstrated   1. Left ventricular ejection fraction, by estimation, is 60 to 65%. The  left ventricle has normal function. The left ventricle has no regional  wall motion abnormalities. Left ventricular diastolic parameters were  normal.   2. Right ventricular systolic function is normal. The right ventricular  size is normal. There is normal pulmonary artery systolic pressure.   3. The mitral valve is abnormal. Trivial mitral valve regurgitation. No  evidence of mitral stenosis.   4. ? functoinally bicuspid with poor visualization of commisure between right and left cusp Non coronary cusp very sclerotic Gradients stable  since TTE done 08/15/20. The aortic valve is tricuspid. There is moderate calcification of the aortic valve.  Aortic valve  regurgitation is not visualized. Mild aortic valve stenosis.   5. Aortic root stable since TTE done 08/15/20 . Aortic dilatation noted. There is moderate dilatation of the ascending aorta, measuring 42 mm.   6. The inferior vena cava is normal in size with greater than 50%  respiratory variability, suggesting right atrial pressure of 3 mmHg.   Epic records are reviewed at length today  CHA2DS2-VASc Score = 6  The patient's score is based upon: CHF History: 0 HTN History: 1 Diabetes History: 0 Stroke History: 2 Vascular Disease History: 0 Age Score: 2 Gender Score: 1      ASSESSMENT AND PLAN: 1. Paroxysmal Atrial Fibrillation (ICD10:  I48.0) The patient's CHA2DS2-VASc score is 6, indicating a 9.7% annual risk of stroke.   General education about afib provided and questions answered. We also discussed her stroke risk and the risks and benefits of anticoagulation. Stop ASA and Plavix and start Eliquis 5 mg BID (age <80, Cr <1.5) Continue nebivolol 10 mg daily  Continue to monitor burden on ILR  2. Secondary Hypercoagulable State (ICD10:  D68.69) The patient is at significant risk for stroke/thromboembolism based upon her CHA2DS2-VASc Score of 6.  Start Apixaban (Eliquis).   3. HTN Stable, no changes today.  4. Valvular heart disease Aortic stenosis - mild Followed by Dr Ellyn Hack.   Follow up in the AF clinic in one month.    [facility] [address] [place], [place] [address] [phone] 08/31/2021 11:12 AM

## 2021-08-31 NOTE — Patient Instructions (Signed)
Stop aspirin  Stop plavix   Start Eliquis 74m twice a day

## 2021-09-03 ENCOUNTER — Telehealth: Payer: Self-pay | Admitting: Internal Medicine

## 2021-09-03 NOTE — Telephone Encounter (Signed)
Marissa [name], a Pt from [facility] is requesting to extend care to 2xweek- 1 week and 1xweek- 1 week.    Callback #- [phone]

## 2021-09-03 NOTE — Telephone Encounter (Signed)
See below

## 2021-09-04 ENCOUNTER — Ambulatory Visit: Payer: Medicare Other | Admitting: Thoracic Surgery (Cardiothoracic Vascular Surgery)

## 2021-09-04 NOTE — Telephone Encounter (Signed)
Fine for verbals.

## 2021-09-05 NOTE — Telephone Encounter (Signed)
Called Arnold. LVM with verbal orders

## 2021-09-10 DIAGNOSIS — F419 Anxiety disorder, unspecified: Secondary | ICD-10-CM | POA: Diagnosis not present

## 2021-09-10 DIAGNOSIS — S2242XS Multiple fractures of ribs, left side, sequela: Secondary | ICD-10-CM | POA: Diagnosis not present

## 2021-09-10 DIAGNOSIS — I10 Essential (primary) hypertension: Secondary | ICD-10-CM | POA: Diagnosis not present

## 2021-09-10 DIAGNOSIS — S42031D Displaced fracture of lateral end of right clavicle, subsequent encounter for fracture with routine healing: Secondary | ICD-10-CM | POA: Diagnosis not present

## 2021-09-10 DIAGNOSIS — M1811 Unilateral primary osteoarthritis of first carpometacarpal joint, right hand: Secondary | ICD-10-CM | POA: Diagnosis not present

## 2021-09-10 DIAGNOSIS — E039 Hypothyroidism, unspecified: Secondary | ICD-10-CM | POA: Diagnosis not present

## 2021-09-10 DIAGNOSIS — G8929 Other chronic pain: Secondary | ICD-10-CM | POA: Diagnosis not present

## 2021-09-10 DIAGNOSIS — M4312 Spondylolisthesis, cervical region: Secondary | ICD-10-CM | POA: Diagnosis not present

## 2021-09-10 DIAGNOSIS — M4800 Spinal stenosis, site unspecified: Secondary | ICD-10-CM | POA: Diagnosis not present

## 2021-09-10 DIAGNOSIS — E785 Hyperlipidemia, unspecified: Secondary | ICD-10-CM | POA: Diagnosis not present

## 2021-09-10 DIAGNOSIS — J449 Chronic obstructive pulmonary disease, unspecified: Secondary | ICD-10-CM | POA: Diagnosis not present

## 2021-09-10 DIAGNOSIS — F32A Depression, unspecified: Secondary | ICD-10-CM | POA: Diagnosis not present

## 2021-09-10 DIAGNOSIS — I712 Thoracic aortic aneurysm, without rupture, unspecified: Secondary | ICD-10-CM | POA: Diagnosis not present

## 2021-09-10 DIAGNOSIS — G3184 Mild cognitive impairment, so stated: Secondary | ICD-10-CM | POA: Diagnosis not present

## 2021-09-10 DIAGNOSIS — I69311 Memory deficit following cerebral infarction: Secondary | ICD-10-CM | POA: Diagnosis not present

## 2021-09-10 DIAGNOSIS — M47812 Spondylosis without myelopathy or radiculopathy, cervical region: Secondary | ICD-10-CM | POA: Diagnosis not present

## 2021-09-11 ENCOUNTER — Ambulatory Visit
Admission: RE | Admit: 2021-09-11 | Discharge: 2021-09-11 | Disposition: A | Discharge status: Home / Self Care | Payer: Medicare Other | Source: Ambulatory Visit | Attending: Internal Medicine | Admitting: Internal Medicine

## 2021-09-11 ENCOUNTER — Other Ambulatory Visit: Payer: Self-pay

## 2021-09-11 DIAGNOSIS — I700 Atherosclerosis of aorta: Secondary | ICD-10-CM | POA: Diagnosis not present

## 2021-09-11 DIAGNOSIS — R911 Solitary pulmonary nodule: Secondary | ICD-10-CM

## 2021-09-11 DIAGNOSIS — I7121 Aneurysm of the ascending aorta, without rupture: Secondary | ICD-10-CM | POA: Diagnosis not present

## 2021-09-11 DIAGNOSIS — R918 Other nonspecific abnormal finding of lung field: Secondary | ICD-10-CM | POA: Diagnosis not present

## 2021-09-13 ENCOUNTER — Ambulatory Visit (INDEPENDENT_AMBULATORY_CARE_PROVIDER_SITE_OTHER): Payer: Medicare Other

## 2021-09-13 DIAGNOSIS — I6389 Other cerebral infarction: Secondary | ICD-10-CM | POA: Diagnosis not present

## 2021-09-14 LAB — CUP PACEART REMOTE DEVICE CHECK
Date Time Interrogation Session: 20221117210757
Implantable Pulse Generator Implant Date: 20221014

## 2021-09-18 ENCOUNTER — Other Ambulatory Visit: Payer: Self-pay

## 2021-09-18 ENCOUNTER — Ambulatory Visit: Payer: Medicare Other | Admitting: Physician Assistant

## 2021-09-18 VITALS — BP 132/85 | HR 57 | Resp 20 | Ht 64.5 in | Wt 134.6 lb

## 2021-09-18 DIAGNOSIS — I77810 Thoracic aortic ectasia: Secondary | ICD-10-CM | POA: Diagnosis not present

## 2021-09-18 NOTE — Patient Instructions (Signed)
Continue careful blood pressure management as you are doing.  Follow-up in 1 year.

## 2021-09-18 NOTE — Progress Notes (Signed)
HPI: Marissa [name] is a 75 year old woman with a history of hypertension, hyperlipidemia, mitral prolapse, bicuspid aortic valve, mild aortic stenosis, asthma, arthritis, ulcerative colitis, chronic pain, a 4.3 cm ascending aneurysm, and cognitive decline. She has been followed by our practice since 2012 when she was found to have an ascending aortic aneurysm which has previously been stable on serial imaging. Marissa [name] was recently admitted to the hospital (08/06/2021) for altered mental status.  This was initially felt to side effect related to her memantine and his symptoms gradually improved after this patient was discontinued.  However, during the hospital stay, she was noted to have an acute CVA.  She was evaluated by neurology with recommendations for aspirin plus Plavix x90 days.  Since her last visit with [place] a year ago, she is also noted found to have a groundglass lesion in the right upper lobe.  This has been addressed and is being followed by her primary care physician, Dr. Pricilla Holm.  She is followed regularly by cardiology as well for her mild aortic stenosis.  She was last seen by Dr. Ellyn Hack 1 month ago.  An echocardiogram obtained at that time no progression of her aortic valve stenosis. Marissa [name] presents today for scheduled surveillance of her ascending aortic aneurysm.     Current Outpatient Medications  Medication Sig Dispense Refill   albuterol (VENTOLIN HFA) 108 (90 Base) MCG/ACT inhaler Inhale 2 puffs into the lungs every 6 (six) hours as needed for wheezing or shortness of breath. 8 g 2   ALPRAZolam (XANAX) 0.5 MG tablet Take 1 tablet (0.5 mg total) by mouth 2 (two) times daily as needed for anxiety. 20 tablet 0   amLODipine (NORVASC) 10 MG tablet TAKE 1 TABLET(10 MG) BY MOUTH DAILY 90 tablet 0   apixaban (ELIQUIS) 5 MG TABS tablet Take 1 tablet (5 mg total) by mouth 2 (two) times daily. 60 tablet 3   Bempedoic Acid (NEXLETOL) 180 MG TABS Take 180 mg by mouth  daily at 12 noon. 90 tablet 3   carisoprodol (SOMA) 350 MG tablet TAKE 1 TABLET(350 MG) BY MOUTH THREE TIMES DAILY AS NEEDED FOR MUSCLE SPASMS 60 tablet 2   cloNIDine (CATAPRES) 0.2 MG tablet Take 0.1 mg by mouth 2 (two) times daily as needed (systolic over 267). if blood pressure is great than 124 systolic for more than an hour 20 tablet 6   Eszopiclone 3 MG TABS Take 1 tablet (3 mg total) by mouth at bedtime. Take immediately before bedtime 30 tablet 5   ezetimibe (ZETIA) 10 MG tablet TAKE 1 TABLET(10 MG) BY MOUTH DAILY 90 tablet 3   famotidine (PEPCID) 20 MG tablet Take 20 mg by mouth as needed for heartburn or indigestion.     feeding supplement, GLUCERNA SHAKE, (GLUCERNA SHAKE) LIQD Take 237 mLs by mouth daily as needed (if not eating meals).     FLUoxetine (PROZAC) 40 MG capsule TAKE 1 CAPSULE(40 MG) BY MOUTH TWICE DAILY 180 capsule 0   levalbuterol (XOPENEX HFA) 45 MCG/ACT inhaler SMARTSIG:1-2 Puff(s) Via Inhaler Every 8 Hours PRN     levothyroxine (SYNTHROID) 125 MCG tablet TAKE 1 TABLET(125 MCG) BY MOUTH DAILY 90 tablet 1   montelukast (SINGULAIR) 10 MG tablet Take 10 mg by mouth at bedtime.     MYRBETRIQ 50 MG TB24 tablet TAKE 1 TABLET(50 MG) BY MOUTH DAILY 90 tablet 1   nebivolol (BYSTOLIC) 10 MG tablet TAKE 1 TABLET(10 MG) BY MOUTH DAILY AT 8 PM 90 tablet 0  Respiratory Therapy Supplies (FLUTTER) DEVI Use as directed 1 each 0   No current facility-administered medications for this visit.    Physical Exam: Vital signs BP 132/85 Pulse 67 Respirations 20 SPO2 99% on room air  Heart: regular rhythm with occasional premature beats.  She has a soft systolic murmur. Chest: Breath sounds are clear to auscultation Extremities: No edema  Diagnostic Tests: ECHOCARDIOGRAM REPORT         Patient Name:   [facility] Dahlia Bailiff Date of Exam: 08/08/2021  Medical Rec #:  [id number]       Height:       64.5 in  Accession #:    [id number]      Weight:       132.0 lb  Date of Birth:  24-Jun-1946        BSA:          1.648 m  Patient Age:    18 years        BP:           103/76 mmHg  Patient Gender: F               HR:           62 bpm.  Exam Location:  Inpatient   Procedure: 2D Echo   Indications:    stroke     History:        Patient has prior history of Echocardiogram examinations,  most                  recent 08/15/2020. Risk Factors:Dyslipidemia.     Sonographer:    [place]  Referring Phys: [id number] [place]     1. Left ventricular ejection fraction, by estimation, is 60 to 65%. The  left ventricle has normal function. The left ventricle has no regional  wall motion abnormalities. Left ventricular diastolic parameters were  normal.   2. Right ventricular systolic function is normal. The right ventricular  size is normal. There is normal pulmonary artery systolic pressure.   3. The mitral valve is abnormal. Trivial mitral valve regurgitation. No  evidence of mitral stenosis.   4. ? functoinally bicuspid with poor visualization of commisure between  right and left cusp Non coronary cusp very sclerotic Gradients stable  since TTE done 08/15/20. The aortic valve is tricuspid. There is moderate  calcification of the aortic valve.  Aortic valve regurgitation is not visualized. Mild aortic valve stenosis.   5. Aortic root stable since TTE done 08/15/20 . Aortic dilatation noted.  There is moderate dilatation of the ascending aorta, measuring 42 mm.   6. The inferior vena cava is normal in size with greater than 50%  respiratory variability, suggesting right atrial pressure of 3 mmHg.   FINDINGS   Left Ventricle: Left ventricular ejection fraction, by estimation, is 60  to 65%. The left ventricle has normal function. The left ventricle has no  regional wall motion abnormalities. The left ventricular internal cavity  size was normal in size. There is   no left ventricular hypertrophy. Left ventricular diastolic parameters  were normal.    Right Ventricle: The right ventricular size is normal. No increase in  right ventricular wall thickness. Right ventricular systolic function is  normal. There is normal pulmonary artery systolic pressure. The tricuspid  regurgitant velocity is 2.38 m/s, and   with an assumed right atrial pressure of 3 mmHg, the estimated right  ventricular systolic pressure is  25.7 mmHg.   Left Atrium: Left atrial size was normal in size.   Right Atrium: Right atrial size was normal in size.   Pericardium: There is no evidence of pericardial effusion.   Mitral Valve: The mitral valve is abnormal. There is mild thickening of  the mitral valve leaflet(s). There is mild calcification of the mitral  valve leaflet(s). Mild mitral annular calcification. Trivial mitral valve  regurgitation. No evidence of mitral  valve stenosis.   Tricuspid Valve: The tricuspid valve is normal in structure. Tricuspid  valve regurgitation is mild . No evidence of tricuspid stenosis.   Aortic Valve: ? functoinally bicuspid with poor visualization of commisure  between right and left cusp Non coronary cusp very sclerotic Gradients  stable since TTE done 08/15/20. The aortic valve is tricuspid. There is  moderate calcification of the aortic   valve. Aortic valve regurgitation is not visualized. Mild aortic stenosis  is present. Aortic valve mean gradient measures 10.0 mmHg. Aortic valve  peak gradient measures 18.7 mmHg. Aortic valve area, by VTI measures 1.35  cm.   Pulmonic Valve: The pulmonic valve was normal in structure. Pulmonic valve  regurgitation is not visualized. No evidence of pulmonic stenosis.   Aorta: Aortic root stable since TTE done 08/15/20. The aortic root is  normal in size and structure and aortic dilatation noted. There is  moderate dilatation of the ascending aorta, measuring 42 mm.   Venous: The inferior vena cava is normal in size with greater than 50%  respiratory variability, suggesting  right atrial pressure of 3 mmHg.   IAS/Shunts: No atrial level shunt detected by color flow Doppler.      LEFT VENTRICLE  PLAX 2D  LVIDd:         3.60 cm   Diastology  LVIDs:         2.30 cm   LV e' medial:    7.40 cm/s  LV PW:         1.00 cm   LV E/e' medial:  10.0  LV IVS:        1.00 cm   LV e' lateral:   8.81 cm/s  LVOT diam:     1.90 cm   LV E/e' lateral: 8.4  LV SV:         62  LV SV Index:   38  LVOT Area:     2.84 cm      RIGHT VENTRICLE             IVC  RV S prime:     13.40 cm/s  IVC diam: 1.10 cm  TAPSE (M-mode): 1.8 cm   LEFT ATRIUM             Index        RIGHT ATRIUM           Index  LA diam:        3.40 cm 2.06 cm/m   RA Area:     10.40 cm  LA Vol (A2C):   43.5 ml 26.39 ml/m  RA Volume:   18.80 ml  11.40 ml/m  LA Vol (A4C):   38.9 ml 23.60 ml/m  LA Biplane Vol: 42.9 ml 26.02 ml/m   AORTIC VALVE  AV Area (Vmax):    1.33 cm  AV Area (Vmean):   1.29 cm  AV Area (VTI):     1.35 cm  AV Vmax:           216.00 cm/s  AV Vmean:  140.000 cm/s  AV VTI:            0.460 m  AV Peak Grad:      18.7 mmHg  AV Mean Grad:      10.0 mmHg  LVOT Vmax:         101.00 cm/s  LVOT Vmean:        63.600 cm/s  LVOT VTI:          0.219 m  LVOT/AV VTI ratio: 0.48     AORTA  Ao Root diam: 3.10 cm  Ao Asc diam:  4.20 cm   MITRAL VALVE               TRICUSPID VALVE  MV Area (PHT): 3.06 cm    TR Peak grad:   22.7 mmHg  MV Decel Time: 248 msec    TR Vmax:        238.00 cm/s  MV E velocity: 73.70 cm/s  MV A velocity: 75.80 cm/s  SHUNTS  MV E/A ratio:  0.97        Systemic VTI:  0.22 m                             Systemic Diam: 1.90 cm   Jenkins Rouge MD  Electronically signed by Jenkins Rouge MD  Signature Date/Time: 08/08/2021/12:23:21 PM       EXAM: CT CHEST WITHOUT CONTRAST   TECHNIQUE: Multidetector CT imaging of the chest was performed following the standard protocol without IV contrast.   COMPARISON:  Chest radiograph 08/03/2021, CT 4 5 2011    FINDINGS: Cardiovascular: Ascending thoracic aorta measures 41 mm at the level of the LEFT main pulmonary artery (image 60/series 2). Coronary artery calcification and aortic atherosclerotic calcification.   Mediastinum/Nodes: No axillary or supraclavicular adenopathy. No mediastinal or hilar adenopathy. No pericardial fluid. Esophagus normal.   Lungs/Pleura: focus of ground-glass opacity surrounding central RIGHT upper lobe bronchial tree measuring 2.3 x 2.1 cm (image 38/4). This ground-glass density is new from comparison exam and corresponds to the density on comparison radiograph.   Upper Abdomen: Limited view of the liver, kidneys, pancreas are unremarkable. Normal adrenal glands.   Musculoskeletal:  No aggressive osseous lesion.   IMPRESSION: 1. Ground-glass lesion in the central RIGHT upper lobe. Initial follow-up with CT at 6-12 months is recommended to confirm persistence. If persistent, repeat CT is recommended every 2 years until 5 years of stability has been established. This recommendation follows the consensus statement: Guidelines for Management of Incidental Pulmonary Nodules Detected on CT Images: From the Fleischner Society 2017; Radiology 2017; 284:228-243. 2. Mild aneurysmal dilatation of the ascending thoracic aorta (41 mm). Recommend annual imaging followup by CTA or MRA. This recommendation follows 2010 ACCF/AHA/AATS/ACR/ASA/SCA/SCAI/SIR/STS/SVM Guidelines for the Diagnosis and Management of Patients with Thoracic Aortic Disease. Circulation. 2010; 121: Y503-T465. Aortic aneurysm NOS (ICD10-I71.9) 3. Coronary artery calcification and Aortic Atherosclerosis (ICD10-I70.0).     Electronically Signed   By: Suzy Bouchard M.D.   On: 09/12/2021 10:14  Impression / Plan: Very pleasant 75 year old female returns for annual surveillance of her ascending aortic aneurysm.  His aneurysm is stable on noncontrast CT obtained last month as well as recent  echocardiogram.  Her mild aortic stenosis is stable as well.  She has been recently diagnosed with atrial fibrillation after hospital admission for altered mental status.  During the course of the hospital work-up, she was noted to have sustained small CVAs.  She is currently being evaluated by the cardiology service with a loop monitor.  She is on Eliquis.  Her blood pressure appears to be well controlled. We will plan for MRA chest in 1 year.   Antony Odea, PA-C [facility] [phone]

## 2021-09-24 NOTE — Progress Notes (Signed)
[facility] Summary Report / Loop Recorder 

## 2021-09-26 ENCOUNTER — Other Ambulatory Visit: Payer: Self-pay | Admitting: Cardiology

## 2021-09-30 ENCOUNTER — Encounter: Payer: Self-pay | Admitting: Internal Medicine

## 2021-10-01 ENCOUNTER — Other Ambulatory Visit: Payer: Self-pay

## 2021-10-02 ENCOUNTER — Other Ambulatory Visit: Payer: Self-pay

## 2021-10-02 ENCOUNTER — Ambulatory Visit (HOSPITAL_COMMUNITY)
Admission: RE | Admit: 2021-10-02 | Discharge: 2021-10-02 | Disposition: A | Discharge status: Home / Self Care | Payer: Medicare Other | Source: Ambulatory Visit | Attending: Physician Assistant | Admitting: Physician Assistant

## 2021-10-02 ENCOUNTER — Encounter (HOSPITAL_COMMUNITY): Payer: Self-pay | Admitting: Physician Assistant

## 2021-10-02 VITALS — BP 142/82 | HR 51 | Ht 64.5 in | Wt 133.8 lb

## 2021-10-02 DIAGNOSIS — I38 Endocarditis, valve unspecified: Secondary | ICD-10-CM | POA: Diagnosis not present

## 2021-10-02 DIAGNOSIS — I10 Essential (primary) hypertension: Secondary | ICD-10-CM | POA: Diagnosis not present

## 2021-10-02 DIAGNOSIS — I480 Paroxysmal atrial fibrillation: Secondary | ICD-10-CM | POA: Diagnosis not present

## 2021-10-02 DIAGNOSIS — Z09 Encounter for follow-up examination after completed treatment for conditions other than malignant neoplasm: Secondary | ICD-10-CM | POA: Insufficient documentation

## 2021-10-02 DIAGNOSIS — E785 Hyperlipidemia, unspecified: Secondary | ICD-10-CM | POA: Insufficient documentation

## 2021-10-02 DIAGNOSIS — I350 Nonrheumatic aortic (valve) stenosis: Secondary | ICD-10-CM | POA: Insufficient documentation

## 2021-10-02 DIAGNOSIS — D6869 Other thrombophilia: Secondary | ICD-10-CM | POA: Insufficient documentation

## 2021-10-02 DIAGNOSIS — Z8673 Personal history of transient ischemic attack (TIA), and cerebral infarction without residual deficits: Secondary | ICD-10-CM | POA: Diagnosis not present

## 2021-10-02 LAB — CBC
HCT: 33.1 % — ABNORMAL LOW (ref 36.0–46.0)
Hemoglobin: 11.1 g/dL — ABNORMAL LOW (ref 12.0–15.0)
MCH: 31.9 pg (ref 26.0–34.0)
MCHC: 33.5 g/dL (ref 30.0–36.0)
MCV: 95.1 fL (ref 80.0–100.0)
Platelets: 195 10*3/uL (ref 150–400)
RBC: 3.48 MIL/uL — ABNORMAL LOW (ref 3.87–5.11)
RDW: 13 % (ref 11.5–15.5)
WBC: 5.2 10*3/uL (ref 4.0–10.5)
nRBC: 0 % (ref 0.0–0.2)

## 2021-10-02 LAB — BASIC METABOLIC PANEL
Anion gap: 9 (ref 5–15)
BUN: 14 mg/dL (ref 8–23)
CO2: 24 mmol/L (ref 22–32)
Calcium: 9.3 mg/dL (ref 8.9–10.3)
Chloride: 98 mmol/L (ref 98–111)
Creatinine, Ser: 0.89 mg/dL (ref 0.44–1.00)
GFR, Estimated: >60 mL/min (ref >60)
Glucose, Bld: 88 mg/dL (ref 70–99)
Potassium: 3.8 mmol/L (ref 3.5–5.1)
Sodium: 131 mmol/L — ABNORMAL LOW (ref 135–145)

## 2021-10-02 MED ORDER — ESZOPICLONE 3 MG PO TABS: 3.0000 mg | ORAL_TABLET | Freq: Every day | ORAL | 1 refills | Status: DC

## 2021-10-02 NOTE — Progress Notes (Signed)
Primary Care Physician: [name], MD Primary Cardiologist: Dr Ellyn Hack  Primary Electrophysiologist: Dr [name] Referring Physician: Dr Vicente Serene is a 75 y.o. female with a history of HTN, aortic stenosis, HLD, prior CVA, atrial fibrillation who presents for follow up in the [facility].  The patient was initially diagnosed with atrial fibrillation 08/28/21 on ILR which was placed for cryptogenic stroke on 08/09/21. There were two afib episodes detected with the longest being 3 hours. Patient has a CHADS2VASC score of 6. Patient was unaware of her arrhythmia.   On follow up today, patient reports that she has done reasonably well since her last visit. Her ILR has not shown any interim afib. No bleeding issues on anticoagulation. She has noticed some cognitive decline recently and is followed by neurology.   Today, she denies symptoms of palpitations, chest pain, shortness of breath, orthopnea, PND, lower extremity edema, dizziness, presyncope, syncope, snoring, daytime somnolence, bleeding. The patient is tolerating medications without difficulties and is otherwise without complaint today.    Atrial Fibrillation Risk Factors:  she does not have symptoms or diagnosis of sleep apnea. she does not have a history of rheumatic fever.   she has a BMI of Body mass index is 22.61 kg/m.[name] Kitchen Filed Weights   10/02/21 1402  Weight: 60.7 kg     Family History  Problem Relation Age of Onset   Arthritis Mother    Leukemia Father    Heart disease Maternal Uncle    Diabetes Paternal Aunt    Breast cancer Maternal Grandmother    Heart disease Maternal Grandfather    Breast cancer Paternal Grandmother    Colon cancer Neg Hx      Atrial Fibrillation Management history:  Previous antiarrhythmic drugs: none Previous cardioversions: none Previous ablations: none CHADS2VASC score: 6 Anticoagulation history: Eliquis   Past Medical  History:  Diagnosis Date   Arthritis    Asthma    Bicuspid aortic valve 05/2017   Likely functional bicuspid aortic valve with sclerosis and no stenosis.   Bursitis    Cataract    mild   Dysthymia    Hemorrhoids    Hx of ulcerative colitis    per dr Arnoldo Morale as per pt.   Hyperlipidemia    Labile hypertension    managed - labile.   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 Roxan Hockey)   Thyroid disease    Past Surgical History:  Procedure Laterality Date   ABDOMINAL HYSTERECTOMY     BREAST EXCISIONAL BIOPSY Left    BUNIONECTOMY     Cardiac Event Monitor  07/2017   Overall relatively normal.  Normal sinus rhythm with rare bradycardia and tachycardia.  Heart rate ranged from 55-110 bpm.  Occasional PACs and PVCs, every single 1 was felt.  No arrhythmias other than one short 4 beat run of PACs.   COLONOSCOPY  02/04/2005   all normal    FOOT SURGERY     3 pins in toes    HAND SURGERY     left thumb joint resection   LOOP RECORDER INSERTION N/A 08/09/2021   Procedure: LOOP RECORDER INSERTION;  Surgeon: [name], MD;  Location: [facility];  Service: Cardiovascular;  Laterality: N/A;   nasal revision     NM MYOVIEW LTD  07/2017   LOW RISK study.  No ischemia or infarction.  EF greater than 65%.    SINUS IRRIGATION     TONSILLECTOMY AND ADENOIDECTOMY     TRANSTHORACIC ECHOCARDIOGRAM  06/'1, 8/'19   a) Moderate LVH. Normal EF 60-65%. Normal diastolic parameters. --> Difficult to fully visualize the aortic valve. Cannot exclude bicuspid valve. Mild aortic stenosis noted. No PFO. Mildly dilated left atrium. Trivial MR. No comment on mitral valve prolapse. Moderately dilated ascending aorta.;; b) F/u Echo To evaluate  the aortic valve. -- Bicuspid AoV - mildly thickened / calcified. - No stenosis   TRANSTHORACIC ECHOCARDIOGRAM  08/08/2021   EF 60 to 65%.  Normal diastolic parameters?.   ?  Functional bicuspid aortic valve with R&L cusp sommisure.  Very sclerotic.  Stable gradients ~mean gradient 10 mmHg. Very Mild stenosis. Aorta dilated to 42 mm.  Stable.  RAP 3 mmHg   TRANSTHORACIC ECHOCARDIOGRAM  08/15/2020   EF 60 to 65%.  No R WMA.  GRII DD.  Mild Aortic Stenosis (mean gradient 10 mmHg).  Moderate to severe aortic root dilation (45 mm) normal IVC.  (Stable compared to February 2020)     Current Outpatient Medications  Medication Sig Dispense Refill   albuterol (VENTOLIN HFA) 108 (90 Base) MCG/ACT inhaler Inhale 2 puffs into the lungs every 6 (six) hours as needed for wheezing or shortness of breath. 8 g 2   ALPRAZolam (XANAX) 0.5 MG tablet Take 1 tablet (0.5 mg total) by mouth 2 (two) times daily as needed for anxiety. 20 tablet 0   amLODipine (NORVASC) 10 MG tablet TAKE 1 TABLET(10 MG) BY MOUTH DAILY 90 tablet 3   apixaban (ELIQUIS) 5 MG TABS tablet Take 1 tablet (5 mg total) by mouth 2 (two) times daily. 60 tablet 3   Bempedoic Acid (NEXLETOL) 180 MG TABS Take 180 mg by mouth daily at 12 noon. 90 tablet 3   carisoprodol (SOMA) 350 MG tablet TAKE 1 TABLET(350 MG) BY MOUTH THREE TIMES DAILY AS NEEDED FOR MUSCLE SPASMS 60 tablet 2   cloNIDine (CATAPRES) 0.2 MG tablet Take 0.1 mg by mouth 2 (two) times daily as needed (systolic over 160). if blood pressure is great than 109 systolic for more than an hour 20 tablet 6   Eszopiclone 3 MG TABS Take 1 tablet (3 mg total) by mouth at bedtime. Take immediately before bedtime 90 tablet 1   ezetimibe (ZETIA) 10 MG tablet TAKE 1 TABLET(10 MG) BY MOUTH DAILY 90 tablet 3   famotidine (PEPCID) 20 MG tablet Take 20 mg by mouth as needed for heartburn or indigestion.     feeding supplement, GLUCERNA SHAKE, (GLUCERNA SHAKE) LIQD Take 237 mLs by mouth daily as needed (if not  eating meals).     FLUoxetine (PROZAC) 40 MG capsule TAKE 1 CAPSULE(40 MG) BY MOUTH TWICE DAILY 180 capsule 0   levalbuterol (XOPENEX HFA) 45 MCG/ACT inhaler SMARTSIG:1-2 Puff(s) Via Inhaler Every 8 Hours PRN     levothyroxine (SYNTHROID) 125 MCG tablet TAKE 1 TABLET(125 MCG) BY MOUTH DAILY 90 tablet 1   LYLLANA 0.05 MG/24HR patch Place onto the skin.     montelukast (SINGULAIR) 10 MG tablet Take 10 mg by mouth at bedtime.     MYRBETRIQ 50 MG TB24 tablet TAKE 1 TABLET(50 MG) BY MOUTH DAILY 90 tablet 1  nebivolol (BYSTOLIC) 10 MG tablet TAKE 1 TABLET(10 MG) BY MOUTH DAILY AT 8 PM 90 tablet 3   Respiratory Therapy Supplies (FLUTTER) DEVI Use as directed 1 each 0   No current facility-administered medications for this encounter.    Allergies  Allergen Reactions   Fentanyl Shortness Of Breath and Rash   Rosuvastatin Anaphylaxis, Anxiety, Hives, Itching, Nausea And Vomiting, Other (See Comments), Palpitations, Photosensitivity and Shortness Of Breath   Amoxicillin Diarrhea    Severe diarrhea, rash to vaginal area with swelling    Hctz [Hydrochlorothiazide] Other (See Comments)    HypoNatremia   Hydralazine     Weak, sweats, red skin   Codeine Other (See Comments)    makes her hyper   Conjugated Estrogens Itching and Rash   Erythromycin Rash    had a rash with emycin, has done ok with other meds in it's class   Piroxicam Itching and Rash    Feldene    Social History   Socioeconomic History   Marital status: Married    Spouse name: Not on file   Number of children: 2   Years of education: Not on file   Highest education level: Not on file  Occupational History   Occupation: retired  Tobacco Use   Smoking status: Never   Smokeless tobacco: Never  Vaping Use   Vaping Use: Never used  Substance and Sexual Activity   Alcohol use: Yes    Alcohol/week: 7.0 standard drinks    Types: 7 Shots of liquor per week    Comment: 1 drink daily club soda with burbon 08/31/2021   Drug  use: No   Sexual activity: Not Currently  Other Topics Concern   Not on file  Social History Narrative   Right handed    Lives with husband    Social Determinants of Health   Financial Resource Strain: Not on file  Food Insecurity: Not on file  Transportation Needs: Not on file  Physical Activity: Not on file  Stress: Not on file  Social Connections: Not on file  Intimate Partner Violence: Not on file     ROS- All systems are reviewed and negative except as per the HPI above.  Physical Exam: Vitals:   10/02/21 1402  BP: (!) 142/82  Pulse: (!) 51  Weight: 60.7 kg  Height: 5' 4.5" (1.638 m)    GEN- The patient is a well appearing elderly female, alert and oriented x 3 today.   HEENT-head normocephalic, atraumatic, sclera clear, conjunctiva pink, hearing intact, trachea midline. Lungs- Clear to ausculation bilaterally, normal work of breathing Heart- Regular rate and rhythm, no murmurs, rubs or gallops  GI- soft, NT, ND, + BS Extremities- no clubbing, cyanosis, or edema MS- no significant deformity or atrophy Skin- no rash or lesion Psych- euthymic mood, full affect Neuro- strength and sensation are intact   Wt Readings from Last 3 Encounters:  10/02/21 60.7 kg  09/18/21 61.1 kg  08/31/21 60.3 kg    EKG today demonstrates  SB Vent. rate 51 BPM PR interval 176 ms QRS duration 80 ms QT/QTcB 482/444 ms  Echo 08/08/21 demonstrated   1. Left ventricular ejection fraction, by estimation, is 60 to 65%. The  left ventricle has normal function. The left ventricle has no regional  wall motion abnormalities. Left ventricular diastolic parameters were  normal.   2. Right ventricular systolic function is normal. The right ventricular  size is normal. There is normal pulmonary artery systolic pressure.   3. The mitral  valve is abnormal. Trivial mitral valve regurgitation. No  evidence of mitral stenosis.   4. ? functoinally bicuspid with poor visualization of commisure  between right and left cusp Non coronary cusp very sclerotic Gradients stable since TTE done 08/15/20. The aortic valve is tricuspid. There is moderate calcification of the aortic valve.  Aortic valve regurgitation is not visualized. Mild aortic valve stenosis.   5. Aortic root stable since TTE done 08/15/20 . Aortic dilatation noted. There is moderate dilatation of the ascending aorta, measuring 42 mm.   6. The inferior vena cava is normal in size with greater than 50%  respiratory variability, suggesting right atrial pressure of 3 mmHg.   Epic records are reviewed at length today  CHA2DS2-VASc Score = 6  The patient's score is based upon: CHF History: 0 HTN History: 1 Diabetes History: 0 Stroke History: 2 Vascular Disease History: 0 Age Score: 2 Gender Score: 1      ASSESSMENT AND PLAN: 1. Paroxysmal Atrial Fibrillation (ICD10:  I48.0) The patient's CHA2DS2-VASc score is 6, indicating a 9.7% annual risk of stroke.   ILR shows 0.2% afib burden Continue Eliquis 5 mg BID (age <80, Cr <1.5) Check cbc/bmet Continue nebivolol 10 mg daily   2. Secondary Hypercoagulable State (ICD10:  D68.69) The patient is at significant risk for stroke/thromboembolism based upon her CHA2DS2-VASc Score of 6.  Continue Apixaban (Eliquis).   3. HTN Stable, no changes today.  4. Valvular heart disease Aortic stenosis - mild Followed by Dr Ellyn Hack.   Follow up with Dr Ellyn Hack as scheduled. AF clinic in 6 months.    [facility] [address] [place],  [address] [phone] 10/02/2021 2:15 PM

## 2021-10-03 ENCOUNTER — Telehealth: Payer: Self-pay | Admitting: Cardiology

## 2021-10-03 NOTE — Telephone Encounter (Signed)
   Pt c/o medication issue:  1. Name of Medication: Bempedoic Acid (NEXLETOL) 180 MG TABS  2. How are you currently taking this medication (dosage and times per day)? Take 180 mg by mouth daily at 12 noon.  3. Are you having a reaction (difficulty breathing--STAT)?   4. What is your medication issue? Pt said she tried getting this meds in the pharmacy and it will cost her [phone]

## 2021-10-03 NOTE — Telephone Encounter (Signed)
Spoke to patient . Patient states she has not started medication . She could not afford that amount.   RN informed patient will try for prior authorization - to see what cost would be ,if cost is still high . Patient may qualify for assistance with  [email or web address]

## 2021-10-08 ENCOUNTER — Other Ambulatory Visit: Payer: Self-pay | Admitting: Internal Medicine

## 2021-10-08 ENCOUNTER — Encounter: Payer: Self-pay | Admitting: Internal Medicine

## 2021-10-08 ENCOUNTER — Other Ambulatory Visit: Payer: Self-pay

## 2021-10-08 DIAGNOSIS — E039 Hypothyroidism, unspecified: Secondary | ICD-10-CM

## 2021-10-08 MED ORDER — LEVOTHYROXINE SODIUM 125 MCG PO TABS: ORAL_TABLET | ORAL | 1 refills | Status: DC

## 2021-10-09 ENCOUNTER — Other Ambulatory Visit: Payer: Self-pay | Admitting: Internal Medicine

## 2021-10-09 ENCOUNTER — Other Ambulatory Visit (HOSPITAL_COMMUNITY): Payer: Self-pay

## 2021-10-09 MED ORDER — APIXABAN 5 MG PO TABS: 5.0000 mg | ORAL_TABLET | Freq: Two times a day (BID) | ORAL | 0 refills | Status: DC

## 2021-10-16 ENCOUNTER — Ambulatory Visit (INDEPENDENT_AMBULATORY_CARE_PROVIDER_SITE_OTHER): Payer: Medicare Other

## 2021-10-16 DIAGNOSIS — I6389 Other cerebral infarction: Secondary | ICD-10-CM | POA: Diagnosis not present

## 2021-10-17 LAB — CUP PACEART REMOTE DEVICE CHECK
Date Time Interrogation Session: 20221220210914
Implantable Pulse Generator Implant Date: 20221014

## 2021-10-25 NOTE — Progress Notes (Signed)
[facility] Summary Report / Loop Recorder 

## 2021-10-30 ENCOUNTER — Ambulatory Visit: Payer: Medicare Other | Admitting: Neurology

## 2021-10-31 ENCOUNTER — Ambulatory Visit: Payer: Medicare Other | Admitting: Neurology

## 2021-11-13 ENCOUNTER — Telehealth: Payer: Self-pay | Admitting: Internal Medicine

## 2021-11-13 NOTE — Telephone Encounter (Signed)
LVM for pt to rtn my call to schedule AWV with [facility]. Please schedule this appt if pt calls the office.

## 2021-11-16 ENCOUNTER — Other Ambulatory Visit: Payer: Self-pay | Admitting: Internal Medicine

## 2021-11-19 ENCOUNTER — Ambulatory Visit (INDEPENDENT_AMBULATORY_CARE_PROVIDER_SITE_OTHER): Payer: Medicare Other

## 2021-11-19 DIAGNOSIS — I6389 Other cerebral infarction: Secondary | ICD-10-CM | POA: Diagnosis not present

## 2021-11-19 LAB — CUP PACEART REMOTE DEVICE CHECK
Date Time Interrogation Session: 20230122230131
Implantable Pulse Generator Implant Date: 20221014

## 2021-11-22 ENCOUNTER — Ambulatory Visit (INDEPENDENT_AMBULATORY_CARE_PROVIDER_SITE_OTHER): Payer: Medicare Other

## 2021-11-22 DIAGNOSIS — F3341 Major depressive disorder, recurrent, in partial remission: Secondary | ICD-10-CM

## 2021-11-22 DIAGNOSIS — E785 Hyperlipidemia, unspecified: Secondary | ICD-10-CM

## 2021-11-22 DIAGNOSIS — E039 Hypothyroidism, unspecified: Secondary | ICD-10-CM

## 2021-11-22 NOTE — Progress Notes (Signed)
Chronic Care Management Pharmacy Note  11/22/2021 Name:  Marissa [name] MRN:  [id number] DOB:  1946-04-03  Summary: - Patient admitted to hospital 08/06/2021-08/27/2021 - found to have CVA  -08/28/2021 - diagnosed with Afib - started on eliquis by cardiology - confirms she has stopped ASA and plavix - notes to bruising on arms and shoulder, denies any blood in stool / urine -Has not yet started on nexletol due to cost - has not yet completed healthwell application  -Checking BP at home - controlled averaging 122/60-140/70 - has not had to use clonidine as of late -Asthma under control, using Singulair, has not had to use rescue inhaler   Recommendations/Changes made from today's visit: -Recommending no changes to medications at this time, patient to continue monitoring BP/ HR once daily, monitor for signs and symptoms of bleeding  -Reviewed with patient healthwell application, will reach out with any issues completing application for nexletol   Subjective: Marissa [name] is an 76 y.o. year old female who is a primary patient of [name], MD.  The CCM team was consulted for assistance with disease management and care coordination needs.    Engaged with patient by telephone for follow up visit in response to provider referral for pharmacy case management and/or care coordination services.   Consent to Services:  The patient was given the following information about Chronic Care Management services today, agreed to services, and gave verbal consent: 1. CCM service includes personalized support from designated clinical staff supervised by the primary care provider, including individualized plan of care and coordination with other care providers 2. 24/7 contact phone numbers for assistance for urgent and routine care needs. 3. Service will only be billed when office clinical staff spend 20 minutes or more in a month to coordinate care. 4. Only one practitioner may furnish and bill  the service in a calendar month. 5.The patient may stop CCM services at any time (effective at the end of the month) by phone call to the office staff. 6. The patient will be responsible for cost sharing (co-pay) of up to 20% of the service fee (after annual deductible is met). Patient agreed to services and consent obtained.  Patient Care Team: [name], MD as PCP - General (Internal Medicine) [name], MD as PCP - Cardiology (Cardiology) Melrose Nakayama, MD as Consulting Physician (Cardiothoracic Surgery) [name], MD as Consulting Physician (Cardiology) Tanda Rockers, MD as Consulting Physician (Pulmonary Disease) Cameron Sprang, MD as Consulting Physician (Neurology) Tomasa Blase, [facility] as Pharmacist (Pharmacist)  Recent office visits: 08/14/2021 - Dr. [name] - hospital follow up  / issues sleeping - solitary pulmonary nodule - persistent finding on CXR in hospital  - CT scan ordered today to rule out lesion / mass, resume lunesta, xanax rx'd for prn use  - follow up in 3 months    Recent consult visits: 10/02/2021 - Ebbie Latus PA - Cardiology - diagnosed with Afib 08/28/2021 - no changes to medications - follow up in 6 months  09/18/2021 - Enid Cutter PA-C  - cardiothoracic surgery - stable, no changes to medications, plan for MRA chest in 1 year  08/31/2021 - Clint Fenton PA - Cardiology - diagnosed with Afib 08/28/2021 - started on eliquis 74m BID, stop ASA and plavix  - follow up in 1 month  08/20/2021 - Dr. HEllyn Hack- Cardiology - hospital follow up / post stroke - restart nexletol 1829mdaily - clonidine  as needed for SBP >160 - follow up in 4-5 months   Hospital visits: 08/06/2021-08/09/2021 [facility] Admission - found to have acute CVA - neurology recommended for ASA + plavix x 90 days, then continue ASA alone - loop recorder prior to dc 08/03/2021 - ED visit - decided to leave before evaluation could be completed   Objective:  Lab  Results  Component Value Date   CREATININE 0.89 10/02/2021   BUN 14 10/02/2021   GFR 64.60 08/20/2021   GFRNONAA >60 10/02/2021   GFRAA 93 01/12/2020   NA 131 (L) 10/02/2021   K 3.8 10/02/2021   CALCIUM 9.3 10/02/2021   CO2 24 10/02/2021   GLUCOSE 88 10/02/2021    Lab Results  Component Value Date/Time   HGBA1C 5.1 08/07/2021 05:00 AM   GFR 64.60 08/20/2021 03:14 PM   GFR 72.53 08/14/2021 03:36 PM    Last diabetic Eye exam:  No results found for: HMDIABEYEEXA  Last diabetic Foot exam:  No results found for: HMDIABFOOTEX   Lab Results  Component Value Date   CHOL 250 (H) 08/07/2021   HDL 111 08/07/2021   LDLCALC 109 (H) 08/07/2021   LDLDIRECT 146.0 11/21/2020   TRIG 150 (H) 08/07/2021   CHOLHDL 2.3 08/07/2021    Hepatic Function Latest Ref Rng & Units 08/14/2021 08/09/2021 08/06/2021  Total Protein 6.0 - 8.3 g/dL 7.0 5.6(L) 7.1  Albumin 3.5 - 5.2 g/dL 4.5 3.4(L) 4.3  AST 0 - 37 U/L 28 24 36  ALT 0 - 35 U/L 21 15 22   Alk Phosphatase 39 - 117 U/L 50 41 45  Total Bilirubin 0.2 - 1.2 mg/dL 0.4 1.1 1.3(H)  Bilirubin, Direct 0.0 - 0.3 mg/dL - - -    Lab Results  Component Value Date/Time   TSH 3.084 08/09/2021 03:36 AM   TSH 1.51 11/01/2020 11:52 AM   TSH 0.265 (L) 01/12/2020 11:15 AM   FREET4 1.13 08/04/2018 02:08 PM   FREET4 1.22 08/07/2017 02:06 PM    CBC Latest Ref Rng & Units 10/02/2021 08/14/2021 08/09/2021  WBC 4.0 - 10.5 K/uL 5.2 4.6 4.9  Hemoglobin 12.0 - 15.0 g/dL 11.1(L) 12.0 11.0(L)  Hematocrit 36.0 - 46.0 % 33.1(L) 34.6(L) 31.7(L)  Platelets 150 - 400 K/uL 195 254.0 163    Lab Results  Component Value Date/Time   VD25OH 42.11 11/27/2017 11:59 AM   VD25OH 16.58 (L) 08/07/2017 02:06 PM    Clinical ASCVD: Yes  The ASCVD Risk score (Arnett DK, et al., 2019) failed to calculate for the following reasons:   The patient has a prior MI or stroke diagnosis    Depression screen [facility] 2/9 07/27/2020 02/21/2020 07/26/2019  Decreased Interest 0 3 0  Down,  Depressed, Hopeless 0 1 1  PHQ - 2 Score 0 4 1  Altered sleeping - 1 1  Tired, decreased energy - 1 2  Change in appetite - 0 1  Feeling bad or failure about yourself  - 1 0  Trouble concentrating - 3 0  Moving slowly or fidgety/restless - 2 0  Suicidal thoughts - 0 0  PHQ-9 Score - 12 5  Difficult doing work/chores - Somewhat difficult Not difficult at all  Some recent data might be hidden     Social History   Tobacco Use  Smoking Status Never  Smokeless Tobacco Never   BP Readings from Last 3 Encounters:  10/02/21 (!) 142/82  09/18/21 132/85  08/31/21 (!) 156/74   Pulse Readings from Last 3 Encounters:  10/02/21 [name] Kitchen)  51  09/18/21 (!) 57  08/31/21 (!) 59   Wt Readings from Last 3 Encounters:  10/02/21 133 lb 12.8 oz (60.7 kg)  09/18/21 134 lb 9.6 oz (61.1 kg)  08/31/21 133 lb (60.3 kg)   BMI Readings from Last 3 Encounters:  10/02/21 22.61 kg/m  09/18/21 22.75 kg/m  08/31/21 22.13 kg/m    Assessment/Interventions: Review of patient past medical history, allergies, medications, health status, including review of consultants reports, laboratory and other test data, was performed as part of comprehensive evaluation and provision of chronic care management services.   SDOH:  (Social Determinants of Health) assessments and interventions performed: Yes  SDOH Screenings   Alcohol Screen: Not on file  Depression (PHQ2-9): Not on file  Financial Resource Strain: Not on file  Food Insecurity: Not on file  Housing: Not on file  Physical Activity: Not on file  Social Connections: Not on file  Stress: Not on file  Tobacco Use: Low Risk    Smoking Tobacco Use: Never   Smokeless Tobacco Use: Never   Passive Exposure: Not on file  Transportation Needs: Not on file    [place]  Allergies  Allergen Reactions   Fentanyl Shortness Of Breath and Rash   Rosuvastatin Anaphylaxis, Anxiety, Hives, Itching, Nausea And Vomiting, Other (See Comments), Palpitations,  Photosensitivity and Shortness Of Breath   Amoxicillin Diarrhea    Severe diarrhea, rash to vaginal area with swelling    Hctz [Hydrochlorothiazide] Other (See Comments)    HypoNatremia   Hydralazine     Weak, sweats, red skin   Codeine Other (See Comments)    makes her hyper   Conjugated Estrogens Itching and Rash   Erythromycin Rash    had a rash with emycin, has done ok with other meds in it's class   Piroxicam Itching and Rash    Feldene    Medications Reviewed Today     Reviewed by Tomasa Blase, [facility] (Pharmacist) on 11/22/21 at [place] List Status: <None>   Medication Order Taking? Sig Documenting Provider Last Dose Status Informant  albuterol (VENTOLIN HFA) 108 (90 Base) MCG/ACT inhaler [id number] No Inhale 2 puffs into the lungs every 6 (six) hours as needed for wheezing or shortness of breath.  Patient not taking: Reported on 11/22/2021   [name], MD Not Taking Active   ALPRAZolam Duanne Moron) 0.5 MG tablet [id number] Yes TAKE 1 TABLET(0.5 MG) BY MOUTH TWICE DAILY AS NEEDED FOR ANXIETY [name], MD Taking Active   amLODipine (NORVASC) 10 MG tablet [id number] Yes TAKE 1 TABLET(10 MG) BY MOUTH DAILY [name], MD Taking Active   apixaban (ELIQUIS) 5 MG TABS tablet [id number] Yes Take 1 tablet (5 mg total) by mouth 2 (two) times daily. Fenton, Clint R, PA Taking Active   Bempedoic Acid (NEXLETOL) 180 MG TABS [id number] No Take 180 mg by mouth daily at 12 noon.  Patient not taking: Reported on 11/22/2021   [name], MD Not Taking Active   carisoprodol (SOMA) 350 MG tablet [id number] Yes TAKE 1 TABLET(350 MG) BY MOUTH THREE TIMES DAILY AS NEEDED FOR MUSCLE SPASMS [name], MD Taking Active   cloNIDine (CATAPRES) 0.2 MG tablet [id number] Yes Take 0.1 mg by mouth 2 (two) times daily as needed (systolic over 948). if blood pressure is great than 016 systolic for more than an hour [name], MD Taking Active   Eszopiclone 3 MG TABS  [id number] Yes Take 1 tablet (3 mg total)  by mouth at bedtime. Take immediately before bedtime [name], MD Taking Active   ezetimibe (ZETIA) 10 MG tablet [id number] Yes TAKE 1 TABLET(10 MG) BY MOUTH DAILY [name], MD Taking Active   famotidine (PEPCID) 20 MG tablet [id number] No Take 20 mg by mouth as needed for heartburn or indigestion.  Patient not taking: Reported on 11/22/2021   [provider] Not Taking Active Multiple Informants  feeding supplement, GLUCERNA SHAKE, ([place]) LIQD [id number] Yes Take 237 mLs by mouth daily as needed (if not eating meals). [provider] Taking Active Multiple Informants  FLUoxetine (PROZAC) 40 MG capsule [id number] Yes TAKE 1 CAPSULE(40 MG) BY MOUTH TWICE DAILY  Patient taking differently: Take 40 mg by mouth daily.   [name], MD Taking Active   levalbuterol [facility] HFA) 45 MCG/ACT inhaler [id number] No INHALE 1 TO 2 PUFFS INTO THE LUNGS EVERY 8 HOURS AS NEEDED FOR WHEEZING  Patient not taking: Reported on 11/22/2021   Janith Lima, MD Not Taking Active   levothyroxine (SYNTHROID) 125 MCG tablet [id number] Yes TAKE 1 TABLET(125 MCG) BY MOUTH DAILY [name], MD Taking Active   LYLLANA 0.05 MG/24HR patch [id number] Yes Place onto the skin. [provider] Taking Active   montelukast (SINGULAIR) 10 MG tablet [id number] Yes Take 10 mg by mouth at bedtime. [provider] Taking Active   MYRBETRIQ 50 MG TB24 tablet [id number] Yes TAKE 1 TABLET(50 MG) BY MOUTH DAILY [name], MD Taking Active   nebivolol (BYSTOLIC) 10 MG tablet [id number] Yes TAKE 1 TABLET(10 MG) BY MOUTH DAILY AT 8 PM [name], MD Taking Active   Respiratory Therapy Supplies (FLUTTER) DEVI [id number] Yes Use as directed Tanda Rockers, MD Taking Active Multiple Informants            Patient Active Problem List   Diagnosis Date Noted   Paroxysmal atrial fibrillation ([place]) 08/31/2021    Secondary hypercoagulable state ([place]) 08/31/2021   Solitary pulmonary nodule 08/17/2021   Fall at home, initial encounter 12/26/3141   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[place]) 88/87/5797   Chronic cough 06/01/2021   Memory loss 07/15/2020   Malnutrition ([place]) 12/22/2018   Hypokalemia 12/22/2018   Hypomagnesemia 12/22/2018   Hypophosphatemia 12/22/2018   Anxiety    Prolonged QT interval 12/21/2018   Hyponatremia 12/21/2018   Hyperlipidemia with target LDL less than 100 10/02/2018   Encounter for chronic pain management 11/28/2017   Dyspnea on exertion 06/01/2017   Mild aortic stenosis 05/27/2017   Routine general medical examination at a health care facility 07/09/2016   Frequent falls 03/28/2016   Rosacea 08/18/2015   Lumbar radiculopathy 07/07/2015   Urinary incontinence 06/20/2015   Hemorrhoids 10/06/2014   Cough variant asthma vs UACS 08/03/2014   Labile hypertension 02/22/2014   Insomnia secondary to chronic pain 07/09/2012   B12 deficiency 04/03/2011   Unspecified vitamin D deficiency 07/25/2010   ANEMIA, IRON DEFICIENCY 04/25/2009   Allergic rhinitis 02/01/2009   Mild dilation of ascending aorta ([place]) 12/27/2008   Chronic fatigue syndrome 04/21/2008   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/19/2008   Osteoarthritis 01/19/2008   Hypothyroidism 03/13/2007   Asthma 03/13/2007   Fibromyalgia 03/13/2007    Immunization History  Administered Date(s) Administered   Fluad Quad(high Dose 65+) 07/14/2020, 08/14/2021   Hep A / Hep B 02/24/2018, 06/25/2018   Hepatitis B, adult 03/26/2018   Influenza Split 09/12/2011, 08/19/2012   Influenza Whole 08/20/2007, 07/29/2008, 08/07/2009, 07/25/2010   Influenza, High Dose  Seasonal PF 07/09/2016, 07/14/2017, 07/24/2018   Influenza,inj,Quad PF,6+ Mos 07/19/2013, 08/16/2014, 07/13/2015   Influenza-Unspecified 07/15/2019   PFIZER(Purple Top)SARS-COV-2 Vaccination 11/17/2019, 12/08/2019   Pneumococcal Conjugate-13 01/06/2015   Pneumococcal  Polysaccharide-23 07/19/2013   Tdap 07/29/2011   Zoster, Live 10/28/2012    Conditions to be addressed/monitored:  Hypertension, Hyperlipidemia, Asthma, Hypothyroidism, Depression and Anxiety  Care Plan : CCM Care Plan  Updates made by Tomasa Blase, RPH since 11/22/2021 12:00 AM     Problem: HTN, HLD, Hypothyroidism, Chronic Pain, OAB, Asthma, Depression, Anxiety, Insomnia,   Priority: High  Onset Date: 03/29/2021     Long-Range Goal: Disease Management   Start Date: 03/29/2021  Expected End Date: 09/28/2021  This Visit's Progress: On track  Recent Progress: On track  Priority: High  Note:   Current Barriers:  Unable to independently monitor therapeutic efficacy Does not adhere to prescribed medication regimen  Pharmacist Clinical Goal(s):  Patient will verbalize ability to afford treatment regimen achieve adherence to monitoring guidelines and medication adherence to achieve therapeutic efficacy achieve ability to self administer medications as prescribed through use of bubble packaging / blister packages from pharmacy  as evidenced by patient report through collaboration with PharmD and provider.   Interventions: 1:1 collaboration with [name], MD regarding development and update of comprehensive plan of care as evidenced by provider attestation and co-signature Inter-disciplinary care team collaboration (see longitudinal plan of care) Comprehensive medication review performed; medication list updated in electronic medical record  Hypertension (BP goal <140/90) -Controlled - per most recent readings from patient  -Current treatment: Amlodipine 21m daily  Clonidine 0.142mtwice daily as needed - for systolic blood pressure >1>347mHg Nebivolol 1076maily  -Medications previously tried: Spironolactone, valsartan, verapamil , hydralazine, irbesartan, lisinopril  -Current home readings: 139/73 - 56, 131/79-68, 133/66-61, 147/77-98(flutters), 122/81 -Current  dietary habits: reports to diet that is low in sodium and caffeine  -Current exercise habits: none at this time  -Denies hypotensive/hypertensive symptoms -Educated on BP goals and benefits of medications for prevention of heart attack, stroke and kidney damage; Daily salt intake goal < 2300 mg; Importance of home blood pressure monitoring; -Counseled to monitor BP at home daily to every other day, document, and provide log at future appointments -Counseled on diet and exercise extensively Recommended to continue current medication  Hyperlipidemia / History of CVA: (LDL goal < 100) -Not ideally controlled  Lab Results  Component Value Date   LDLCALC 109 (H) 08/07/2021  -Current treatment: Nexletol 180m30mily  - has not started due to cost  Ezetimibe 10mg31mly -Medications previously tried: rosuvastatin   -Current dietary patterns: reports to eating fish at least 1-2 times weekly, notes to eating chicken on occasion, red meat throughout the week, avoids fried foods  -Current exercise habits: none at this time  -Educated on Cholesterol goals;  Importance of limiting foods high in cholesterol; Exercise goal of 150 minutes per week; -Recommended for patient to complete healthwell application for nexletol copay help - reviewed with patient how to complete, will reach out should she have any issues completing   Atrial Fibrillation (Goal: prevent stroke and major bleeding) -Controlled  -Dx 08/28/2021 - loop recorder implanted before discharge from hospital 08/09/2021 - latest interrogation of device shows no new arrhthymias - Afib 3.8% -Notes to pain around implant, has discussed with cardiology before per patient, no major change from baseline -CHADSVASC: 6 -Current treatment: Eliquis 5mg B35m -Medications previously tried: n/a -Home BP and HR readings: 139/73 - 56,  131/79-68, 629/52-84, 147/77-98(flutters), 122/81  -Counseled on increased risk of stroke due to Afib and benefits of  anticoagulation for stroke prevention; importance of adherence to anticoagulant exactly as prescribed; bleeding risk associated with Eliquis and importance of self-monitoring for signs/symptoms of bleeding; avoidance of NSAIDs due to increased bleeding risk with anticoagulants; seeking medical attention after a head injury or if there is blood in the urine/stool; -Recommended to continue current medication  Asthma(Goal: control symptoms and prevent exacerbations) -Controlled -Current treatment  Levalbuterol 67mg/act - 2 puffs every 8 hours as needed - has not had to use  Monelukast 135mdaily  -Medications previously tried: symbicort, proair -Exacerbations requiring treatment in last 6 months: n/a -Frequency of rescue inhaler use: 2-3 time weekly  -Counseled on Proper inhaler technique; When to use rescue inhaler -Recommended to continue current medication - patient has not started albuterol, reviewed with patient levalbuterol and albuterol are both rescue inhalers - as patient currently has levalbuterol inhaler will plan to continue as needed   Depression/Anxiety/ Insomnia (Goal: Mood control / prevention of anxiety attacks/ improved sleep quality ) -Controlled -Current treatment: Fluoxetine 4018maily  Alprazolam 0.5mg19mice daily as needed  eszopiclone 3mg 26mbedtime  -Medications previously tried/failed: doxepin, aripiprazole  -Patient is following with Psychology at this time  -Educated on Benefits of medication for symptom control Benefits of cognitive-behavioral therapy with or without medication -Recommended to continue current medication   Hypothyroidism(Goal: Maintenance of TSH) -Controlled  Lab Results  Component Value Date   TSH 3.084 08/09/2021  -Current treatment  Levothyroxine 125mcg58mly  -Medications previously tried: n/a  -Recommended to continue current medication  Overactive Bladder (Goal: Control of urination status / urge) -Not ideally  controlled -Current treatment  Myrbetriq 50mg d64m  -Medications previously tried: vesicare, detrol LA, oxybutynin  -Recommended to continue current medication   Chronic Pain / Fibromyalgia  (Neck / Shoulders / Back) (Goal: Pain control / prevention of flares ) -Not ideally controlled -Current treatment  Carisoprodol 350mg ta46m 3 times daily as needed  -Medications previously tried: baclofen, gabapentin, lyrica   -Recommending no changes at this time  Patient Goals/Self-Care Activities Patient will:  - take medications as prescribed check blood pressure / HR daily to every other day , document, and provide at future appointments Complete application for healthwell grant for nexletol  collaborate with provider on medication access solutions  Follow Up Plan: Telephone follow up appointment with care management team member scheduled for: 3 months The patient has been provided with contact information for the care management team and has been advised to call with any health related questions or concerns.     Medication Assistance:  Possible that patient may need PA / Assistance for NexletolAdvanced Surgical Care Of Boerne LLCecheck once she is home and reach out if needed   [id number] -[id number] [address] [phone] [phone]? No  Patient endorses 90% compliance  Care Plan and Follow Up Patient Decision:  Patient agrees to Care Plan and Follow-up.  Plan: Telephone follow up appointment with care management team member scheduled for:  3 months and The patient has been provided with contact information for the care management team and has been advised to call with any health related questions or concerns.   Corliss Coggeshall CTomasa Blase Clinical Pharmacist, [place]

## 2021-11-22 NOTE — Patient Instructions (Signed)
Visit Information  Following are the goals we discussed today:   Manage My Medicine   Timeframe:  Long-Range Goal Priority:  High Start Date:   03/29/2021                         Expected End Date: 11/22/2022                    Follow Up Date 02/20/2022   - call for medicine refill 2 or 3 days before it runs out - keep a list of all the medicines I take; vitamins and herbals too    Why is this important?   These steps will help you keep on track with your medicines  Track and Manage My Heart Rate / Blood Pressure   Timeframe:  Long-Range Goal Priority:  High Start Date:  03/29/2021                           Expected End Date:  11/22/2022                    Follow Up Date 02/20/2022   - check blood pressure daily - write blood pressure results in a log or diary    Why is this important?   You won't feel high blood pressure, but it can still hurt your blood vessels.  High blood pressure can cause heart or kidney problems. It can also cause a stroke.  Making lifestyle changes like losing a little weight or eating less salt will help.  Checking your blood pressure at home and at different times of the day can help to control blood pressure.  If the doctor prescribes medicine remember to take it the way the doctor ordered.  Call the office if you cannot afford the medicine or if there are questions about it.    Plan: Telephone follow up appointment with care management team member scheduled for:  3 months  The patient has been provided with contact information for the care management team and has been advised to call with any health related questions or concerns.   Tomasa Blase, PharmD Clinical Pharmacist, [facility]   Please call the care guide team at [phone] if you need to cancel or reschedule your appointment.   Patient verbalizes understanding of instructions and care plan provided today and agrees to view in [place]. Active MyChart status confirmed with  patient.

## 2021-11-23 ENCOUNTER — Encounter: Payer: Self-pay | Admitting: Cardiology

## 2021-11-27 DIAGNOSIS — E785 Hyperlipidemia, unspecified: Secondary | ICD-10-CM

## 2021-11-27 DIAGNOSIS — I10 Essential (primary) hypertension: Secondary | ICD-10-CM

## 2021-11-27 DIAGNOSIS — E039 Hypothyroidism, unspecified: Secondary | ICD-10-CM | POA: Diagnosis not present

## 2021-11-27 DIAGNOSIS — I4891 Unspecified atrial fibrillation: Secondary | ICD-10-CM | POA: Diagnosis not present

## 2021-11-29 ENCOUNTER — Ambulatory Visit: Payer: Medicare Other | Admitting: Physician Assistant

## 2021-11-29 ENCOUNTER — Encounter: Payer: Self-pay | Admitting: Physician Assistant

## 2021-11-29 ENCOUNTER — Other Ambulatory Visit: Payer: Self-pay

## 2021-11-29 VITALS — BP 159/71 | HR 56 | Ht 64.0 in | Wt 130.6 lb

## 2021-11-29 DIAGNOSIS — G3184 Mild cognitive impairment, so stated: Secondary | ICD-10-CM | POA: Diagnosis not present
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
## 2021-11-29 NOTE — Patient Instructions (Addendum)
° °  3. Continue repeat Neurocognitive testing in March 2023  4. Follow-up in 6 month   5. Referral to  Cognitive Behavioral Therapy   Clinicaltrials.gov to see if any trials   FALL PRECAUTIONS: Be cautious when walking. Scan the area for obstacles that may increase the risk of trips and falls. When getting up in the mornings, sit up at the edge of the bed for a few minutes before getting out of bed. Consider elevating the bed at the head end to avoid drop of blood pressure when getting up. Walk always in a well-lit room (use night lights in the walls). Avoid area rugs or power cords from appliances in the middle of the walkways. Use a walker or a cane if necessary and consider physical therapy for balance exercise. Get your eyesight checked regularly.  FINANCIAL OVERSIGHT: Supervision, especially oversight when making financial decisions or transactions is also recommended as difficulties arise.  HOME SAFETY: Consider the safety of the kitchen when operating appliances like stoves, microwave oven, and blender. Consider having supervision and share cooking responsibilities until no longer able to participate in those. Accidents with firearms and other hazards in the house should be identified and addressed as well.  DRIVING: Regarding driving, in patients with progressive memory problems, driving will be impaired. We advise to have someone else do the driving if trouble finding directions or if minor accidents are reported. Independent driving assessment is available to determine safety of driving.  ABILITY TO BE LEFT ALONE: If patient is unable to contact 911 operator, consider using LifeLine, or when the need is there, arrange for someone to stay with patients. Smoking is a fire hazard, consider supervision or cessation. Risk of wandering should be assessed by caregiver and if detected at any point, supervision and safe proof recommendations should be instituted.  MEDICATION SUPERVISION:  Inability to self-administer medication needs to be constantly addressed. Implement a mechanism to ensure safe administration of the medications.  RECOMMENDATIONS FOR ALL PATIENTS WITH MEMORY PROBLEMS: 1. Continue to exercise (Recommend 30 minutes of walking everyday, or 3 hours every week) 2. Increase social interactions - continue going to [place] and enjoy social gatherings with friends and family 3. Eat healthy, avoid fried foods and eat more fruits and vegetables 4. Maintain adequate blood pressure, blood sugar, and blood cholesterol level. Reducing the risk of stroke and cardiovascular disease also helps promoting better memory. 5. Avoid stressful situations. Live a simple life and avoid aggravations. Organize your time and prepare for the next day in anticipation. 6. Sleep well, avoid any interruptions of sleep and avoid any distractions in the bedroom that may interfere with adequate sleep quality 7. Avoid sugar, avoid sweets as there is a strong link between excessive sugar intake, diabetes, and cognitive impairment We discussed the Mediterranean diet, which has been shown to help patients reduce the risk of progressive memory disorders and reduces cardiovascular risk. This includes eating fish, eat fruits and green leafy vegetables, nuts like almonds and hazelnuts, walnuts, and also use olive oil. Avoid fast foods and fried foods as much as possible. Avoid sweets and sugar as sugar use has been linked to worsening of memory function.  There is always a concern of gradual progression of memory problems. If this is the case, then we may need to adjust level of care according to patient needs. Support, both to the patient and caregiver, should then be put into place.

## 2021-11-30 NOTE — Progress Notes (Signed)
[facility] Summary Report / Loop Recorder 

## 2021-12-17 ENCOUNTER — Telehealth: Payer: Self-pay

## 2021-12-17 NOTE — Progress Notes (Signed)
Chronic Care Management Pharmacy Assistant   Name: Marissa [name]  MRN: [id number] DOB: 05-27-1946  Marissa [name] is an 76 y.o. year old female who presents for his follow-up CCM visit with the clinical pharmacist.  Reason for Encounter: Disease State-General    Recent office visits:  None ID  Recent consult visits:  11/29/21 Rondel Jumbo, PA-C-Neurology (Mild cognitive impairment) No orders, Patient states she is not taking Albuterol, Bempedoic, Famotidine or Levalbuterol as of 11/22/21  Hospital visits:  None since last coordination call  Medications: Outpatient Encounter Medications as of 12/17/2021  Medication Sig   ALPRAZolam (XANAX) 0.5 MG tablet TAKE 1 TABLET(0.5 MG) BY MOUTH TWICE DAILY AS NEEDED FOR ANXIETY   amLODipine (NORVASC) 10 MG tablet TAKE 1 TABLET(10 MG) BY MOUTH DAILY   apixaban (ELIQUIS) 5 MG TABS tablet Take 1 tablet (5 mg total) by mouth 2 (two) times daily.   carisoprodol (SOMA) 350 MG tablet TAKE 1 TABLET(350 MG) BY MOUTH THREE TIMES DAILY AS NEEDED FOR MUSCLE SPASMS   cloNIDine (CATAPRES) 0.2 MG tablet Take 0.1 mg by mouth 2 (two) times daily as needed (systolic over 169). if blood pressure is great than 450 systolic for more than an hour   Eszopiclone 3 MG TABS Take 1 tablet (3 mg total) by mouth at bedtime. Take immediately before bedtime   ezetimibe (ZETIA) 10 MG tablet TAKE 1 TABLET(10 MG) BY MOUTH DAILY   feeding supplement, GLUCERNA SHAKE, (GLUCERNA SHAKE) LIQD Take 237 mLs by mouth daily as needed (if not eating meals).   FLUoxetine (PROZAC) 40 MG capsule TAKE 1 CAPSULE(40 MG) BY MOUTH TWICE DAILY (Patient taking differently: Take 40 mg by mouth daily.)   levothyroxine (SYNTHROID) 125 MCG tablet TAKE 1 TABLET(125 MCG) BY MOUTH DAILY   LYLLANA 0.05 MG/24HR patch Place onto the skin.   montelukast (SINGULAIR) 10 MG tablet Take 10 mg by mouth at bedtime.   MYRBETRIQ 50 MG TB24 tablet TAKE 1 TABLET(50 MG) BY MOUTH DAILY   nebivolol (BYSTOLIC) 10 MG  tablet TAKE 1 TABLET(10 MG) BY MOUTH DAILY AT 8 PM   Respiratory Therapy Supplies (FLUTTER) DEVI Use as directed   No facility-administered encounter medications on file as of 12/17/2021.   Have you had any problems recently with your health?Patient states that she had a fall and hurt her back and has been in bed rest for a while.She states that she believes she is still in afib and is wearing a holter monitor. She has an appointment to see the cardiologist in about 2 weeks  Have you had any problems with your pharmacy?Patient states that she is not having any problems with getting medications from the pharmacy. She said that her husband has fill out a form to get if she qualifies for patient assistance for Nexletol, has not heard anything yet  What issues or side effects are you having with your medications?Patient did state that she has bruising from the eliquis, sick, confused  What would you like me to pass along to [facility] for them to help you with? Patient states that she would like to come off of the eliquis and would like for someone to re-evaluate if she needs to be on this or not. She reports that the eliquis makes her sic, confused and gives her nightmares  What can we do to take care of you better? Patient states that she would like to come off eliquis that would help  Care Gaps: Colonoscopy-NA Diabetic  Foot Exam-NA Mammogram-NA Ophthalmology-NA Dexa Scan - NA Annual Well Visit - NA Micro albumin-NA Hemoglobin A1c- 08/07/21  Star Rating Drugs: None ID  Ethelene Hal Clinical Pharmacist Assistant [phone]

## 2021-12-22 LAB — CUP PACEART REMOTE DEVICE CHECK
Date Time Interrogation Session: 20230224230556
Implantable Pulse Generator Implant Date: 20221014

## 2021-12-24 ENCOUNTER — Other Ambulatory Visit: Payer: Self-pay | Admitting: Internal Medicine

## 2021-12-24 ENCOUNTER — Ambulatory Visit (INDEPENDENT_AMBULATORY_CARE_PROVIDER_SITE_OTHER): Payer: Medicare Other

## 2021-12-24 ENCOUNTER — Telehealth (HOSPITAL_COMMUNITY): Payer: Self-pay | Admitting: *Deleted

## 2021-12-24 DIAGNOSIS — I6389 Other cerebral infarction: Secondary | ICD-10-CM | POA: Diagnosis not present

## 2021-12-24 MED ORDER — RIVAROXABAN 15 MG PO TABS: 15.0000 mg | ORAL_TABLET | Freq: Every day | ORAL | 3 refills | Status: DC

## 2021-12-24 NOTE — Telephone Encounter (Signed)
Patient husband called in stating patient is having symptoms of itching, intermittent tremors and intermittent rash. They believe it is related to Eliquis. Pt states she held her dose of eliquis last night and this morning but symptoms have continued.  Re-educated the patient on importance of no missed doses of anticoagulation with history of CVA. Offered to change Eliquis to Xarelto - pt would like to try change in agent to see if symptoms subside. Per Adline Peals PA pt qualifies for Xarelto 78m once a day. Pt and husband verbalize understanding of how to transition to [place] If symptoms do not subside despite change in agent recommended contacting PCP for further workup.

## 2021-12-26 ENCOUNTER — Ambulatory Visit: Payer: Medicare Other | Admitting: Cardiology

## 2021-12-26 ENCOUNTER — Encounter: Payer: Self-pay | Admitting: Cardiology

## 2021-12-26 ENCOUNTER — Other Ambulatory Visit: Payer: Self-pay

## 2021-12-26 VITALS — BP 135/66 | HR 58 | Ht 64.5 in | Wt 132.8 lb

## 2021-12-26 DIAGNOSIS — T50905S Adverse effect of unspecified drugs, medicaments and biological substances, sequela: Secondary | ICD-10-CM | POA: Diagnosis not present

## 2021-12-26 DIAGNOSIS — E785 Hyperlipidemia, unspecified: Secondary | ICD-10-CM

## 2021-12-26 DIAGNOSIS — I480 Paroxysmal atrial fibrillation: Secondary | ICD-10-CM | POA: Diagnosis not present

## 2021-12-26 DIAGNOSIS — I77810 Thoracic aortic ectasia: Secondary | ICD-10-CM

## 2021-12-26 DIAGNOSIS — D6869 Other thrombophilia: Secondary | ICD-10-CM

## 2021-12-26 DIAGNOSIS — M940 Chondrocostal junction syndrome [Tietze]: Secondary | ICD-10-CM

## 2021-12-26 DIAGNOSIS — I350 Nonrheumatic aortic (valve) stenosis: Secondary | ICD-10-CM

## 2021-12-26 DIAGNOSIS — T50905A Adverse effect of unspecified drugs, medicaments and biological substances, initial encounter: Secondary | ICD-10-CM | POA: Insufficient documentation

## 2021-12-26 DIAGNOSIS — R0989 Other specified symptoms and signs involving the circulatory and respiratory systems: Secondary | ICD-10-CM

## 2021-12-26 DIAGNOSIS — I700 Atherosclerosis of aorta: Secondary | ICD-10-CM

## 2021-12-26 MED ORDER — PREDNISONE 10 MG PO TABS: ORAL_TABLET | ORAL | 0 refills | Status: AC

## 2021-12-26 NOTE — Patient Instructions (Signed)
Medication Instructions:  ? ?Stop Eliquis  tonight ? ?Start Xarelto 15 mg  at 6 pm  contact the office in 2 weeks  to see how she is feeling ( itching)  ? ? ?Weaning taper  of Prednisone take with food  ?Take 60 mg ( 6 tablets of 10 mg ) once a day for 3 days ?Take 40 mg( 4 tablets of 10 mg) once a day for 3 days ?Take 20 mg ( 2 tablets of 10 mg ) once a day for 3 days  ?Take 10 mg ( 1 tablet of 10 mg ) once a day for 3 days then stop ? ? You can use up to 1000 mg  tylenol twice a day I need for pain  ? ?*If you need a refill on your cardiac medications before your next appointment, please call your pharmacy* ? ? ?Lab Work: ?Not needed ?If you have labs (blood work) drawn today and your tests are completely normal, you will receive your results only by: ?MyChart Message (if you have MyChart) OR ?A paper copy in the mail ?If you have any lab test that is abnormal or we need to change your treatment, we will call you to review the results. ? ? ?Testing/Procedures: ?Not needed ? ? ?Follow-Up: ?At [facility], you and your health needs are our priority.  As part of our continuing mission to provide you with exceptional heart care, we have created designated Provider Care Teams.  These Care Teams include your primary Cardiologist (physician) and Advanced Practice Providers (APPs -  Physician Assistants and Nurse Practitioners) who all work together to provide you with the care you need, when you need it. ? ?  ? ?Your next appointment:   ?1 to 2 month(s) ? ?The format for your next appointment:   ?In Person ? ?Provider:   ?[name], MD  ? ? ?Other Instructions  ?

## 2021-12-26 NOTE — Progress Notes (Signed)
Primary Care Provider: [name], MD Cardiologist: [name], MD Electrophysiologist: None  Clinic Note: No chief complaint on file.  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  MR angio of chest ordered.  Pending      Labile hypertension (Chronic)    Remains an issue.  However it has been stable of late.  Still has some ups and downs but better.  Rarely using clonidine for hypertension.  She is on Bystolic and amlodipine.  Not on diuretic because of concerns of dehydration (with sicca syndrome).      Mild aortic stenosis (Chronic)    Relatively stable gradients in 1 year follow-up echo.  We can wait another 3 years to follow-up.      Hyperlipidemia with target LDL less than 100 (Chronic)    She had a good drop in lipids with combination of Nexletol and ezetimibe.  Unfortunately she decided to stop Nexletol again because of concerns for adverse reaction.  She was concerned about doing injections therefore PCSK9 inhibitors were concerned. Inclisiran may well be the best option for her.  However I would like for her to get through the Eliquis interaction and all of her musculoskeletal pains etc.      Relevant Orders   EKG 12-Lead (Completed)   Aortic atherosclerosis (HCC) (Chronic)    Noted on CT scan along with mild dilation of the thoracic aorta.  Respect medications: Beta-blocker and calcium blocker for blood pressure control with as needed clonidine.  No longer on aspirin because of DOAC.  Failed statin and failed Nexletol.  We will need to treat lipids, beyond Zetia.  With all of her current adverse events, will hold off until this is all stabilized.      Paroxysmal atrial fibrillation (HCC) - Primary (Chronic)    CHA2DS2-VASc Score = 7  The patient's score is based upon: CHF History: 0 HTN  History: 1 Diabetes History: 0 Stroke History: 2 Vascular Disease History: 1 Age Score: 2 Gender Score: 1   ASSESSMENT AND PLAN: Paroxysmal Atrial Fibrillation (ICD10:  I48.0) The patient's CHA2DS2-VASc score is 7, indicating a 11.2% annual risk of stroke.   HAS-BLED Score 3  Secondary Hypercoagulable State (ICD10:  D68.69){ The patient is at significant risk for stroke/thromboembolism based upon her CHA2DS2-VASc Score of 7.  Start Rivaroxaban (Xarelto)-dosed at 15 mg daily. --> Initially started on Eliquis, however after concerns for adverse reaction, she was switched to Xarelto..    Rate controlled right now Bystolic 10 mg daily. Continue to monitor loop recorder to determine burden of A-fib.  Because of her interactions of medications I will be reluctant to consider antiarrhythmic agent  => Because of concern about adverse reaction to Eliquis, I will do a prednisone taper:  Weaning taper  of Prednisone 60, 40, 20, 10 mg daily x3 days each      Relevant Orders   EKG 12-Lead (Completed)     Other   Costochondritis    She is having extreme tenderness to palpation all across the chest, notably at the insertion site of the ILR.  She was asking about getting out.  I actually think that we need little bit more data from it before we take it out.  I suggested that we keep it in for now.  She can use Tylenol up to 1 g twice daily  for pain.  Partly because of her drug reaction and inflammatory diffuse MSK pain, I will do a steroid taper to hopefully stop the process.   .  taper  of Prednisone take with food  Take 60 mg ( 6 tablets of 10 mg ) once a day for 3 days Take 40 mg( 4 tablets of 10 mg) once a day for 3 days Take 20 mg ( 2 tablets of 10 mg ) once a day for 3 days  Take 10 mg ( 1 tablet of 10 mg ) once a day for 3 days then stop      Relevant Medications   predniSONE (DELTASONE) 10 MG tablet   Other Relevant Orders   EKG 12-Lead (Completed)   Adverse effect of drug     Cannot be sure if the reaction with rash is related to the Eliquis or not, but mostly because her concerns I think we can just switch to Xarelto.  I explained to her that it is a one-to-one trade basically.  Treating her costochondritis with a prednisone taper.  Hopefully this will also help her rash.      Relevant Orders   EKG 12-Lead (Completed)   Secondary hypercoagulable state (HCC) (Chronic)    Switching from Eliquis to Xarelto because of concern for adverse reaction.  See CHA2DS2-VASc score and Has-Bled score in A-fib section.      ===================================  HPI:    Marissa [name] is a 76 y.o. 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  JOval LinseyRankin was last seen on August 27, 2021 for hospital follow-up after her stroke.  She said that she was started to get a little bit better and started to gain circumference back.  Still having some word finding issues and some mild weakness.  Had not noticed any irregular heartbeats palpitations but had noted her blood pressure going high and low from the 190s to the 100s. -> She noted some exertional dyspnea irregular heartbeat and dizziness as well as fatigue.  Very unsteady gait and poor balance.  Some memory loss issues.  Very anxious. We attempted to start Nexletol-patient called in stating that she cannot afford it.  Was given information about patient assistance and prior authorization.  Recent Hospitalizations: None  ILR interrogation 08/28/2021 showed 2 A-fib episodes, longest 1 was 3 hours. 08/31/2021: Seen by CBenson Norway PA [facility].   Patient somewhat unaware of arrhythmia.  Noted some wrist and shoulder pain.  CHA2DS2-VASc score 6 (actually 7) => initiated Eliquis 5 mg  twice daily and stop aspirin and Plavix. Beta-blocker had been converted to Bystolic 10 mg.  125/06/5637-[id number]-[facility]in A-fib clinic with Mr. FMarlene Lard PUtah No further episodes noted on ILR interrogation.  Doing well.  No bleeding issues started did note some cognitive decline-followed by neurology.  ILR interrogation reveals 0.2% A-fib burden. Plan-follow-up in 6 months.  Reviewed  CV studies:    The following studies were reviewed today: (if available, images/films reviewed: From Epic Chart or Care Everywhere) CT Chest without Contrast: Groundglass lesion in the central right upper lobe, mild aneurysmal dilation of the ascending aorta (41 mm).  Coronary artery calcification and aortic atherosclerosis.  ILR interrogation 11/20/2021: Battery status OK. Normal device function. No new symptom, tachy, brady, or pause episodes. 31 new  AF episodes, longest duration 8hrs, Known PAF, burden 4.8%, Eliquis, Bystolic prescribed  Interval History:   Oval Linsey Fiorito presents today as a work-in patient after telephone call a couple days ago.  She noted having tremors and rash as well as itching.  They felt this was related to Eliquis. => They offered to switch to Xarelto to see if symptoms subside, prescription for Xarelto 15 mg provided.  However they did not do that.  She comes in today very anxious and upset about multiple different things.  She is hurting all over.  She is saying that she wants the Lipitor out because she is starting her chest.  She has diffuse pain all across her chest that is a sharp stabbing and aching pain it is there all the time.  She is having this rash and itching is driving her crazy. She said about 3 weeks ago she had an episode where she passed out and had a fall.  She is not sure why nothing is being said about the lip recorder with her passing out.  I indicated that we have yet to see the ILR interrogation for this month.  It can be indicative of something if there is an episode of.   There had not been any concerning things on the loop or interrogations other than A-fib so far.  I explained a lot about the nuances of the loop recorder and how it is not real-time interrogation, and unless there is a severe finding such as prolonged pause or V. tach/V-fib, they probably will contact her.   She thankfully has not really felt any sensation of irregular heartbeats palpitations.  No further episodes of TIA or emesis fugax.  She is has this progressive cognitive decline not necessarily associated with any particular things besides some mild memory issues, or loss of center.  She does have occasional aphasia. 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  Tentative etiology.  Unable to tolerate Aricept and rivastigmine.  Also not able to tolerate Namenda.  (Crying spells).  Wanted to do cognitive behavioral therapy.   Significant balance issues-has had several falls.  Least 1 time she hit her head.   CV Review of Symptoms (Summary) Cardiovascular ROS: positive for - chest pain, loss of consciousness, and occasional skipped beats but nothing long.  Chest pain is left-sided, present all the time.  May or may not get worse with activity.  Worse with lying down.  Seems to be focused around where the ILR is inserted.;  Poor balance and falls. negative for - dyspnea on exertion, edema, irregular heartbeat, orthopnea, palpitations, paroxysmal nocturnal dyspnea, rapid heart rate, shortness of breath, or recurrent TIA/amaurosis fugax.  REVIEWED OF SYSTEMS   Review of Systems  Constitutional:  Positive for malaise/fatigue. Negative for weight loss.  HENT:  Negative for congestion and nosebleeds.   Respiratory:  Negative for cough, shortness of breath and wheezing.   Cardiovascular:  Positive for chest pain (Per HPI).       Per HPI  Gastrointestinal:  Negative for blood in stool and melena.  Genitourinary:  Negative  for hematuria.  Musculoskeletal:  Positive for falls, joint pain and myalgias.  Skin:  Positive for itching and rash.  Neurological:  Positive for dizziness (Poor balance.  Unsteady gait.) and weakness.  Psychiatric/Behavioral:  Positive for depression and memory loss. The patient is nervous/anxious and has insomnia.    I have reviewed and (  if needed) personally updated the patient's problem list, medications, allergies, past medical and surgical history, social and family history.   PAST MEDICAL HISTORY   Past Medical History:  Diagnosis Date   Arthritis    Asthma    Bicuspid aortic valve 05/2017   Likely functional bicuspid aortic valve with sclerosis and no stenosis.   Bursitis    Cataract    mild   Dysthymia    Hemorrhoids    Hx of ulcerative colitis    per dr Arnoldo Morale as per pt.   Hyperlipidemia    Labile hypertension    managed - labile.   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 Roxan Hockey)   Thyroid disease     PAST SURGICAL HISTORY   Past Surgical History:  Procedure Laterality Date   ABDOMINAL HYSTERECTOMY     BREAST EXCISIONAL BIOPSY Left    BUNIONECTOMY     Cardiac Event Monitor  07/2017   Overall relatively normal.  Normal sinus rhythm with rare bradycardia and tachycardia.  Heart rate ranged from 55-110 bpm.  Occasional PACs and PVCs, every single 1 was felt.  No arrhythmias other than one short 4 beat run of PACs.   COLONOSCOPY  02/04/2005   all normal    FOOT SURGERY     3 pins in toes    HAND SURGERY     left thumb joint resection   LOOP RECORDER INSERTION N/A 08/09/2021   Procedure: LOOP RECORDER INSERTION;  Surgeon: [name], MD;  Location: [facility];  Service: Cardiovascular;   Laterality: N/A;   nasal revision     NM MYOVIEW LTD  07/2017   LOW RISK study.  No ischemia or infarction.  EF greater than 65%.    SINUS IRRIGATION     TONSILLECTOMY AND ADENOIDECTOMY     TRANSTHORACIC ECHOCARDIOGRAM  06/'1, 8/'19   a) Moderate LVH. Normal EF 60-65%. Normal diastolic parameters. --> Difficult to fully visualize the aortic valve. Cannot exclude bicuspid valve. Mild aortic stenosis noted. No PFO. Mildly dilated left atrium. Trivial MR. No comment on mitral valve prolapse. Moderately dilated ascending aorta.;; b) F/u Echo To evaluate the aortic valve. -- Bicuspid AoV - mildly thickened / calcified. - No stenosis   TRANSTHORACIC ECHOCARDIOGRAM  08/08/2021   EF 60 to 65%.  Normal diastolic parameters?.   ?  Functional bicuspid aortic valve with R&L cusp sommisure.  Very sclerotic.  Stable gradients ~mean gradient 10 mmHg. Very Mild stenosis. Aorta dilated to 42 mm.  Stable.  RAP 3 mmHg   TRANSTHORACIC ECHOCARDIOGRAM  08/15/2020   EF 60 to 65%.  No R WMA.  GRII DD.  Mild Aortic Stenosis (mean gradient 10 mmHg).  Moderate to severe aortic root dilation (45 mm) normal IVC.  (Stable compared to February 2020)     Immunization History  Administered Date(s) Administered   Fluad Quad(high Dose 65+) 07/14/2020, 08/14/2021   Hep A / Hep B 02/24/2018, 06/25/2018   Hepatitis B, adult 03/26/2018   Influenza Split 09/12/2011, 08/19/2012   Influenza Whole 08/20/2007, 07/29/2008, 08/07/2009, 07/25/2010   Influenza, High Dose Seasonal PF 07/09/2016, 07/14/2017, 07/24/2018   Influenza,inj,Quad PF,6+ Mos 07/19/2013, 08/16/2014, 07/13/2015  Influenza-Unspecified 07/15/2019   PFIZER(Purple Top)SARS-COV-2 Vaccination 11/17/2019, 12/08/2019   Pneumococcal Conjugate-13 01/06/2015   Pneumococcal Polysaccharide-23 07/19/2013   Tdap 07/29/2011   Zoster, Live 10/28/2012    MEDICATIONS/ALLERGIES   Current Meds  Medication Sig   ALPRAZolam (XANAX) 0.5 MG tablet TAKE 1 TABLET(0.5 MG) BY MOUTH  TWICE DAILY AS NEEDED FOR ANXIETY   amLODipine (NORVASC) 10 MG tablet TAKE 1 TABLET(10 MG) BY MOUTH DAILY   carisoprodol (SOMA) 350 MG tablet TAKE 1 TABLET(350 MG) BY MOUTH THREE TIMES DAILY AS NEEDED FOR MUSCLE SPASMS   cloNIDine (CATAPRES) 0.2 MG tablet Take 0.1 mg by mouth 2 (two) times daily as needed (systolic over 629). if blood pressure is great than 528 systolic for more than an hour   Eszopiclone 3 MG TABS Take 1 tablet (3 mg total) by mouth at bedtime. Take immediately before bedtime   ezetimibe (ZETIA) 10 MG tablet TAKE 1 TABLET(10 MG) BY MOUTH DAILY   feeding supplement, GLUCERNA SHAKE, (GLUCERNA SHAKE) LIQD Take 237 mLs by mouth daily as needed (if not eating meals).   FLUoxetine (PROZAC) 40 MG capsule TAKE 1 CAPSULE(40 MG) BY MOUTH TWICE DAILY (Patient taking differently: Take 40 mg by mouth daily.)   levothyroxine (SYNTHROID) 125 MCG tablet TAKE 1 TABLET(125 MCG) BY MOUTH DAILY   LYLLANA 0.05 MG/24HR patch Place onto the skin.   montelukast (SINGULAIR) 10 MG tablet TAKE 1 TABLET(10 MG) BY MOUTH AT BEDTIME   MYRBETRIQ 50 MG TB24 tablet TAKE 1 TABLET(50 MG) BY MOUTH DAILY   nebivolol (BYSTOLIC) 10 MG tablet TAKE 1 TABLET(10 MG) BY MOUTH DAILY AT 8 PM   predniSONE (DELTASONE) 10 MG tablet Take 6 tablets (60 mg total) by mouth daily with breakfast for 3 days, THEN 4 tablets (40 mg total) daily with breakfast for 3 days, THEN 2 tablets (20 mg total) daily with breakfast for 3 days, THEN 1 tablet (10 mg total) daily with breakfast for 3 days.   Respiratory Therapy Supplies (FLUTTER) DEVI Use as directed   Rivaroxaban (XARELTO) 15 MG TABS tablet Take 1 tablet (15 mg total) by mouth daily with supper. STOP ELIQUIS    Allergies  Allergen Reactions   Fentanyl Shortness Of Breath and Rash   Rosuvastatin Anaphylaxis, Anxiety, Hives, Itching, Nausea And Vomiting, Other (See Comments), Palpitations, Photosensitivity and Shortness Of Breath   Amoxicillin Diarrhea    Severe diarrhea, rash to  vaginal area with swelling    Hctz [Hydrochlorothiazide] Other (See Comments)    HypoNatremia   Hydralazine     Weak, sweats, red skin   Codeine Other (See Comments)    makes her hyper   Conjugated Estrogens Itching and Rash   Erythromycin Rash    had a rash with emycin, has done ok with other meds in it's class   Piroxicam Itching and Rash    Feldene    SOCIAL HISTORY/FAMILY HISTORY   Reviewed in Epic:  Pertinent findings:  Social History   Tobacco Use   Smoking status: Never   Smokeless tobacco: Never  Vaping Use   Vaping Use: Never used  Substance Use Topics   Alcohol use: Yes    Alcohol/week: 7.0 standard drinks    Types: 7 Shots of liquor per week    Comment: 1 drink daily club soda with burbon 08/31/2021   Drug use: No   Social History   Social History Narrative   Right handed    Lives with husband     OBJCTIVE -PE, EKG, labs  Wt Readings from Last 3 Encounters:  12/26/21 132 lb 12.8 oz (60.2 kg)  11/29/21 130 lb 9.6 oz (59.2 kg)  10/02/21 133 lb 12.8 oz (60.7 kg)    Physical Exam: BP 135/66    Pulse (!) 58    Ht 5' 4.5" (1.638 m)    Wt 132 lb 12.8 oz (60.2 kg)    SpO2 99%    BMI 22.44 kg/m  Physical Exam Vitals reviewed.  Constitutional:      General: She is not in acute distress (Mostly just emotional distress.  Very upset.).    Appearance: She is normal weight. She is not ill-appearing or toxic-appearing.     Comments: Well-groomed.  HENT:     Head: Normocephalic and atraumatic.  Neck:     Vascular: No carotid bruit or JVD.  Cardiovascular:     Rate and Rhythm: Normal rate and regular rhythm. No extrasystoles are present.    Chest Wall: PMI is not displaced.     Pulses: Normal pulses and intact distal pulses.     Heart sounds: S1 normal and S2 normal. Murmur heard.  High-pitched harsh crescendo-decrescendo midsystolic murmur is present with a grade of 2/6 at the upper right sternal border radiating to the neck.    No friction rub. No  gallop.  Pulmonary:     Effort: Pulmonary effort is normal. No respiratory distress.     Breath sounds: Normal breath sounds. No wheezing, rhonchi or rales.  Chest:     Chest wall: Tenderness (Diffuse tenderness all across the left upper chest, second through fourth rib mostly on the left but also on the right.) present.  Musculoskeletal:        General: No swelling. Normal range of motion.     Cervical back: Normal range of motion and neck supple.  Skin:    General: Skin is warm and dry.     Coloration: Skin is not pale.     Findings: Rash (Mild rash on chest and arms-maculopapular) present.  Neurological:     General: No focal deficit present.     Mental Status: She is alert and oriented to person, place, and time.     Gait: Gait abnormal (Very unsteady gait).     Comments: Word finding issues seem to be worse.  More cognitive delay.  Psychiatric:     Comments: Very anxious, very upset.  Somewhat tearful.  More so than usual tangential thoughts and flight of ideas.  Poor insight and poor recollection   Adult ECG Report  Rate: 58;  Rhythm: sinus bradycardia and Left ex deviation, ST and T wave changes in V1.  Less prominent. ;   Narrative Interpretation: Stable  Recent Labs: Reviewed Lab Results  Component Value Date   CHOL 250 (H) 08/07/2021   HDL 111 08/07/2021   LDLCALC 109 (H) 08/07/2021   LDLDIRECT 146.0 11/21/2020   TRIG 150 (H) 08/07/2021   CHOLHDL 2.3 08/07/2021   Lab Results  Component Value Date   CREATININE 0.89 10/02/2021   BUN 14 10/02/2021   NA 131 (L) 10/02/2021   K 3.8 10/02/2021   CL 98 10/02/2021   CO2 24 10/02/2021   CBC Latest Ref Rng & Units 10/02/2021 08/14/2021 08/09/2021  WBC 4.0 - 10.5 K/uL 5.2 4.6 4.9  Hemoglobin 12.0 - 15.0 g/dL 11.1(L) 12.0 11.0(L)  Hematocrit 36.0 - 46.0 % 33.1(L) 34.6(L) 31.7(L)  Platelets 150 - 400 K/uL 195 254.0 163    Lab Results  Component Value Date  HGBA1C 5.1 08/07/2021   Lab Results  Component Value Date    TSH 3.084 08/09/2021    ==================================================  COVID-19 Education: The signs and symptoms of COVID-19 were discussed with the patient and how to seek care for testing (follow up with PCP or arrange E-visit).    I spent a total of 46 minutes with the patient spent in direct patient consultation.  Additional time spent with chart review  / charting (studies, outside notes, etc): 42 min -> Several clinic visits, loop recorder results, neurology visit, PCP visit, ILR results all reviewed.  Medical history updated.  New diagnosis of A-fib discussed.  Medication reaction discussed.  Multiple complaints. Total Time: 88 min  Current medicines are reviewed at length with the patient today.  (+/- concerns)  -> Consider reaction possible from Eliquis.  Convert to Xarelto.  This visit occurred during the SARS-CoV-2 public health emergency.  Safety protocols were in place, including screening questions prior to the visit, additional usage of staff PPE, and extensive cleaning of exam room while observing appropriate contact time as indicated for disinfecting solutions.  Notice: This dictation was prepared with Dragon dictation along with smart phrase technology. Any transcriptional errors that result from this process are unintentional and may not be corrected upon review.  Studies Ordered:   Orders Placed This Encounter  Procedures   EKG 12-Lead    Patient Instructions / Medication Changes & Studies & Tests Ordered   Patient Instructions  Medication Instructions:   Stop Eliquis  tonight  Start Xarelto 15 mg  at 6 pm  contact the office in 2 weeks  to see how she is feeling ( itching)    Weaning taper  of Prednisone take with food  Take 60 mg ( 6 tablets of 10 mg ) once a day for 3 days Take 40 mg( 4 tablets of 10 mg) once a day for 3 days Take 20 mg ( 2 tablets of 10 mg ) once a day for 3 days  Take 10 mg ( 1 tablet of 10 mg ) once a day for 3 days then  stop   You can use up to 1000 mg  tylenol twice a day I need for pain   *If you need a refill on your cardiac medications before your next appointment, please call your pharmacy*   Lab Work: Not needed If you have labs (blood work) drawn today and your tests are completely normal, you will receive your results only by: MyChart Message (if you have MyChart) OR A paper copy in the mail If you have any lab test that is abnormal or we need to change your treatment, we will call you to review the results.   Testing/Procedures: Not needed   Follow-Up: At [facility], you and your health needs are our priority.  As part of our continuing mission to provide you with exceptional heart care, we have created designated Provider Care Teams.  These Care Teams include your primary Cardiologist (physician) and Advanced Practice Providers (APPs -  Physician Assistants and Nurse Practitioners) who all work together to provide you with the care you need, when you need it.     Your next appointment:   1 to 2 month(s)  The format for your next appointment:   In Person  Provider:   [name], MD    Other Instructions      [name], M.D., M.S. Interventional Cardiologist   Pager # [phone] Phone # [phone] [address]  [place], [place] [address]   Thank you for choosing Heartcare at [facility]!!

## 2021-12-29 DIAGNOSIS — I700 Atherosclerosis of aorta: Secondary | ICD-10-CM | POA: Insufficient documentation

## 2021-12-29 NOTE — Assessment & Plan Note (Signed)
She is having extreme tenderness to palpation all across the chest, notably at the insertion site of the ILR.  She was asking about getting out.  I actually think that we need little bit more data from it before we take it out.  I suggested that we keep it in for now. ? ?She can use Tylenol up to 1 g twice daily for pain. ? ?Partly because of her drug reaction and inflammatory diffuse MSK pain, I will do a steroid taper to hopefully stop the process.   ?. ? taper  of Prednisone take with food  ?Take 60 mg ( 6 tablets of 10 mg ) once a day for 3 days ?Take 40 mg( 4 tablets of 10 mg) once a day for 3 days ?Take 20 mg ( 2 tablets of 10 mg ) once a day for 3 days  ?Take 10 mg ( 1 tablet of 10 mg ) once a day for 3 days then stop ?

## 2021-12-29 NOTE — Assessment & Plan Note (Signed)
Switching from Eliquis to Xarelto because of concern for adverse reaction. ? ?See CHA2DS2-VASc score and Has-Bled score in A-fib section. ?

## 2021-12-29 NOTE — Assessment & Plan Note (Signed)
Relatively stable gradients in 1 year follow-up echo.  We can wait another 3 years to follow-up. ?

## 2021-12-29 NOTE — Assessment & Plan Note (Signed)
Noted on CT scan along with mild dilation of the thoracic aorta. ? ?Respect medications: Beta-blocker and calcium blocker for blood pressure control with as needed clonidine.  No longer on aspirin because of DOAC. ? ?Failed statin and failed Nexletol.  We will need to treat lipids, beyond Zetia.  With all of her current adverse events, will hold off until this is all stabilized. ?

## 2021-12-29 NOTE — Assessment & Plan Note (Signed)
She had a good drop in lipids with combination of Nexletol and ezetimibe.  Unfortunately she decided to stop Nexletol again because of concerns for adverse reaction. ? ?She was concerned about doing injections therefore PCSK9 inhibitors were concerned. ?Inclisiran may well be the best option for her.  However I would like for her to get through the Eliquis interaction and all of her musculoskeletal pains etc. ?

## 2021-12-29 NOTE — Assessment & Plan Note (Signed)
Cannot be sure if the reaction with rash is related to the Eliquis or not, but mostly because her concerns I think we can just switch to Xarelto.  I explained to her that it is a one-to-one trade basically. ? ?Treating her costochondritis with a prednisone taper.  Hopefully this will also help her rash. ?

## 2021-12-29 NOTE — Assessment & Plan Note (Addendum)
CHA2DS2-VASc Score = 7  ?The patient's score is based upon: ?CHF History: 0 ?HTN History: 1 ?Diabetes History: 0 ?Stroke History: 2 ?Vascular Disease History: 1 ?Age Score: 2 ?Gender Score: 1 ? ? ?ASSESSMENT AND PLAN: ?Paroxysmal Atrial Fibrillation (ICD10:  I48.0) ?The patient's CHA2DS2-VASc score is 7, indicating a 11.2% annual risk of stroke.   ?HAS-BLED Score 3 ? ?Secondary Hypercoagulable State (ICD10:  D68.69){ ?The patient is at significant risk for stroke/thromboembolism based upon her CHA2DS2-VASc Score of 7.  Start Rivaroxaban (Xarelto)-dosed at 15 mg daily. ?--> Initially started on Eliquis, however after concerns for adverse reaction, she was switched to Xarelto..   ? ?Rate controlled right now Bystolic 10 mg daily. ?Continue to monitor loop recorder to determine burden of A-fib.  Because of her interactions of medications I will be reluctant to consider antiarrhythmic agent ? ?=> Because of concern about adverse reaction to Eliquis, I will do a prednisone taper: ? ?Weaning taper  of Prednisone 60, 40, 20, 10 mg daily x3 days each ?

## 2021-12-29 NOTE — Assessment & Plan Note (Addendum)
Relatively stable.  MR angio of chest ordered.  Pending ?

## 2021-12-29 NOTE — Assessment & Plan Note (Addendum)
Remains an issue.  However it has been stable of late.  Still has some ups and downs but better.  Rarely using clonidine for hypertension.  She is on Bystolic and amlodipine.  Not on diuretic because of concerns of dehydration (with sicca syndrome). ?

## 2021-12-31 NOTE — Progress Notes (Signed)
[facility] Summary Report / Loop Recorder 

## 2022-01-02 ENCOUNTER — Encounter: Payer: Medicare Other | Admitting: Psychology

## 2022-01-03 ENCOUNTER — Telehealth: Payer: Self-pay

## 2022-01-03 NOTE — Telephone Encounter (Signed)
Pt husband is requesting refill on: ?Eszopiclone 3 MG TABS ? ?Pt has lost a couple of pills. Pharmacy states that she can get a refill on 3/11. Pharmacy advised pt to all PCP for a new Rx to refill early. Pt is currently taking Steroids and have been unable to sleep for 4 days.  ? ?Pharmacy: ?[id number] - [place], [address] AT [address] ? ?LOV 08/14/21 ? ?Pt is requesting a call back [phone] ?

## 2022-01-04 NOTE — Telephone Encounter (Signed)
Tried calling the pharmacy for verbal ok to do early refill. Unable to talk with the pharmacy due to the line being busy. I will attempt to contact later on today.  ?

## 2022-01-04 NOTE — Telephone Encounter (Signed)
Should still have refill okay to fill today ?

## 2022-01-07 ENCOUNTER — Telehealth: Payer: Self-pay | Admitting: Cardiology

## 2022-01-07 ENCOUNTER — Other Ambulatory Visit: Payer: Self-pay

## 2022-01-07 DIAGNOSIS — I480 Paroxysmal atrial fibrillation: Secondary | ICD-10-CM

## 2022-01-07 MED ORDER — RIVAROXABAN 15 MG PO TABS: 15.0000 mg | ORAL_TABLET | Freq: Every day | ORAL | 1 refills | Status: DC

## 2022-01-07 NOTE — Telephone Encounter (Signed)
LEFT MESSAGE  TO PATIENT  husband. ? Xarelto 15 mg one tablet  daily  was e-scribed to [employer].   Left message if any question may call the office. ? ?RN  called [facility] - per pharmacy team --  medication did not need a prior authorization . ?

## 2022-01-07 NOTE — Telephone Encounter (Signed)
Message routed to [employer] and pharmacy team for assistance with anticoag change/refill ?

## 2022-01-07 NOTE — Telephone Encounter (Signed)
Ok to continue to Xarelto 64m daily ?

## 2022-01-07 NOTE — Telephone Encounter (Signed)
Pt c/o medication issue: ? ?1. Name of Medication: Rivaroxaban (XARELTO) 15 MG TABS tablet ? ?2. How are you currently taking this medication (dosage and times per day)? As directed ? ?3. Are you having a reaction (difficulty breathing--STAT)? no ? ?4. What is your medication issue? Spoke to patient's husband, he states that Dr. Ellyn Hack let the patient have samples of Xarelto to see how it worked versus the Eliquis and to let him know how they like it. Husband states that it is working well and they would like to start getting refills of this. He is not sure what needs to be done as far as going through his insurance to get this approved but would like to go ahead and get that process started. They are still using  ?[id number] - [place], [address] AT [address]  ?  ?

## 2022-01-07 NOTE — Telephone Encounter (Signed)
OK - glad to hear. ? ?OK to change to Xarelto. ? ?DH ?

## 2022-01-08 ENCOUNTER — Encounter: Payer: Medicare Other | Admitting: Psychology

## 2022-01-09 ENCOUNTER — Encounter: Payer: Medicare Other | Admitting: Psychology

## 2022-01-17 ENCOUNTER — Other Ambulatory Visit: Payer: Self-pay | Admitting: Internal Medicine

## 2022-01-17 ENCOUNTER — Other Ambulatory Visit: Payer: Self-pay | Admitting: Cardiology

## 2022-01-17 DIAGNOSIS — E039 Hypothyroidism, unspecified: Secondary | ICD-10-CM

## 2022-01-21 ENCOUNTER — Encounter: Payer: Self-pay | Admitting: Cardiology

## 2022-01-22 NOTE — Telephone Encounter (Signed)
We can set her up an appt with Dr. Curt Bears to discuss removing the monitory -- we have seen the data over the past several months.  Afib noted, but nothing to explain passing out. ? ?If it is uncomfortable - perhaps we should remove it. ? ?DH ?

## 2022-01-22 NOTE — Telephone Encounter (Signed)
Spoke with spouse who states patient cannot tolerate the loop recorder anymore and wants it removed. She has an appointment with Dr. Ellyn Hack 4/24 and wants to know if patient could be seen sooner to discuss removal of the recorder. Patient is having the same pain in arm and across chest since it was inserted. ?

## 2022-01-22 NOTE — Telephone Encounter (Signed)
Spouse informed of Dr. Allison Quarry reply about the loop recorder, and that Dr. Morene Antu nurse will be calling patient to set up an appointment. Patient is to keep 4/24 appointment with Dr. Ellyn Hack. ?

## 2022-01-24 ENCOUNTER — Other Ambulatory Visit: Payer: Self-pay | Admitting: Internal Medicine

## 2022-01-24 ENCOUNTER — Encounter: Payer: Self-pay | Admitting: Internal Medicine

## 2022-01-24 ENCOUNTER — Ambulatory Visit (INDEPENDENT_AMBULATORY_CARE_PROVIDER_SITE_OTHER): Payer: Medicare Other

## 2022-01-24 DIAGNOSIS — I480 Paroxysmal atrial fibrillation: Secondary | ICD-10-CM | POA: Diagnosis not present

## 2022-01-24 LAB — CUP PACEART REMOTE DEVICE CHECK
Date Time Interrogation Session: 20230330091758
Implantable Pulse Generator Implant Date: 20221014

## 2022-01-25 ENCOUNTER — Ambulatory Visit: Payer: Medicare Other

## 2022-01-25 ENCOUNTER — Other Ambulatory Visit: Payer: Self-pay

## 2022-01-25 MED ORDER — FLUOXETINE HCL 40 MG PO CAPS: 40.0000 mg | ORAL_CAPSULE | Freq: Every day | ORAL | 0 refills | Status: DC

## 2022-01-26 ENCOUNTER — Telehealth: Payer: Self-pay | Admitting: Cardiology

## 2022-01-26 NOTE — Telephone Encounter (Signed)
Called to speak with patient.  Husband answered.  Patient was sleeping at the time of the call.  Patient has not felt well recently in general.  Has chest and arm pain.  Has also had difficulty sleeping. ? ?I reviewed her recent loop recorder monitoring's which show a recent significant increase in atrial fibrillation burden to nearly 100% since early March.  I clarified the patient's reported syncopal episode.  The husband actually does not think that she had a loss of consciousness.  She had a fall but he does not think it was an actual complete loss of consciousness. ? ?I will plan to bring the patient into clinic in the next few weeks to review her atrial fibrillation burden and to discuss options for rhythm control.  For now, continue current medical therapy.  I have sent a note to our scheduler, [place], to get this appointment moved up. ? ?Lysbeth Galas T. [name], MD, [facility], [place] ?Cardiac Electrophysiology ? ?

## 2022-01-28 ENCOUNTER — Encounter: Payer: Self-pay | Admitting: Internal Medicine

## 2022-01-28 ENCOUNTER — Ambulatory Visit (INDEPENDENT_AMBULATORY_CARE_PROVIDER_SITE_OTHER): Payer: Medicare Other | Admitting: Internal Medicine

## 2022-01-28 VITALS — BP 118/78 | HR 74 | Resp 18 | Ht 64.5 in | Wt 131.8 lb

## 2022-01-28 DIAGNOSIS — I480 Paroxysmal atrial fibrillation: Secondary | ICD-10-CM

## 2022-01-28 DIAGNOSIS — E039 Hypothyroidism, unspecified: Secondary | ICD-10-CM

## 2022-01-28 DIAGNOSIS — J301 Allergic rhinitis due to pollen: Secondary | ICD-10-CM | POA: Diagnosis not present

## 2022-01-28 LAB — CBC
HCT: 33.6 % — ABNORMAL LOW (ref 36.0–46.0)
Hemoglobin: 11.4 g/dL — ABNORMAL LOW (ref 12.0–15.0)
MCHC: 34 g/dL (ref 30.0–36.0)
MCV: 94 fl (ref 78.0–100.0)
Platelets: 175 10*3/uL (ref 150.0–400.0)
RBC: 3.57 Mil/uL — ABNORMAL LOW (ref 3.87–5.11)
RDW: 14.6 % (ref 11.5–15.5)
WBC: 5.7 10*3/uL (ref 4.0–10.5)

## 2022-01-28 LAB — COMPREHENSIVE METABOLIC PANEL
ALT: 26 U/L (ref 0–35)
AST: 37 U/L (ref 0–37)
Albumin: 4.2 g/dL (ref 3.5–5.2)
Alkaline Phosphatase: 56 U/L (ref 39–117)
BUN: 17 mg/dL (ref 6–23)
CO2: 26 mEq/L (ref 19–32)
Calcium: 9.5 mg/dL (ref 8.4–10.5)
Chloride: 96 mEq/L (ref 96–112)
Creatinine, Ser: 0.89 mg/dL (ref 0.40–1.20)
GFR: 62.66 mL/min (ref >60.00)
Glucose, Bld: 134 mg/dL — ABNORMAL HIGH (ref 70–99)
Potassium: 3.8 mEq/L (ref 3.5–5.1)
Sodium: 133 mEq/L — ABNORMAL LOW (ref 135–145)
Total Bilirubin: 0.4 mg/dL (ref 0.2–1.2)
Total Protein: 6.6 g/dL (ref 6.0–8.3)

## 2022-01-28 LAB — TSH: TSH: 0.21 u[IU]/mL — ABNORMAL LOW (ref 0.35–5.50)

## 2022-01-28 NOTE — Progress Notes (Signed)
? ?  Subjective:  ? ?Patient ID: Marissa [name], female    DOB: 13-Jan-1946, 76 y.o.   MRN: [id number] ? ?HPI ?The patient is a 76 YO female coming in for resolving URI symptoms and new A fib 100% time. Seeing cardiology soon wants to make sure no cause.  ? ?Review of Systems  ?Constitutional:  Positive for activity change, appetite change and fatigue. Negative for chills, fever and unexpected weight change.  ?HENT:  Positive for congestion. Negative for ear discharge, ear pain, sinus pain, sneezing, sore throat, tinnitus, trouble swallowing and voice change.   ?Eyes: Negative.   ?Respiratory:  Positive for cough and shortness of breath. Negative for chest tightness and wheezing.   ?Cardiovascular:  Positive for chest pain and palpitations. Negative for leg swelling.  ?Gastrointestinal: Negative.  Negative for abdominal distention, abdominal pain, constipation, diarrhea, nausea and vomiting.  ?Musculoskeletal:  Positive for arthralgias, back pain and myalgias.  ?Skin: Negative.   ?Neurological: Negative.   ?Psychiatric/Behavioral: Negative.    ? ?Objective:  ?Physical Exam ?Constitutional:   ?   Appearance: She is well-developed.  ?HENT:  ?   Head: Normocephalic and atraumatic.  ?Cardiovascular:  ?   Rate and Rhythm: Normal rate. Rhythm irregular.  ?Pulmonary:  ?   Effort: Pulmonary effort is normal. No respiratory distress.  ?   Breath sounds: Normal breath sounds. No wheezing or rales.  ?Abdominal:  ?   General: Bowel sounds are normal. There is no distension.  ?   Palpations: Abdomen is soft.  ?   Tenderness: There is no abdominal tenderness. There is no rebound.  ?Musculoskeletal:  ?   Cervical back: Normal range of motion.  ?Skin: ?   General: Skin is warm and dry.  ?Neurological:  ?   Mental Status: She is alert and oriented to person, place, and time.  ?   Coordination: Coordination normal.  ? ? ?Vitals:  ? 01/28/22 1446  ?BP: 118/78  ?Pulse: 74  ?Resp: 18  ?SpO2: 95%  ?Weight: 131 lb 12.8 oz (59.8 kg)  ?Height:  5' 4.5" (1.638 m)  ? ? ?This visit occurred during the SARS-CoV-2 public health emergency.  Safety protocols were in place, including screening questions prior to the visit, additional usage of staff PPE, and extensive cleaning of exam room while observing appropriate contact time as indicated for disinfecting solutions.  ? ?Assessment & Plan:  ? ?

## 2022-01-29 ENCOUNTER — Other Ambulatory Visit: Payer: Self-pay | Admitting: Internal Medicine

## 2022-01-29 DIAGNOSIS — E039 Hypothyroidism, unspecified: Secondary | ICD-10-CM

## 2022-01-29 NOTE — Assessment & Plan Note (Signed)
Checking TSH and adjust synthroid 125 mcg daily. She is newly in A fib all the time so warrants check. ?

## 2022-01-29 NOTE — Assessment & Plan Note (Signed)
Now in A fib most of the time and she is symptomatic from this. Checking CBC, CMP, TSH to rule out metabolic trigger for episode of A fib. Could have been triggered by URI symptoms. Still irreg today on exam. Is taking xarelto and rate controlled with bystolic. ?

## 2022-01-29 NOTE — Assessment & Plan Note (Signed)
Does have symptoms and no acute sinus or lung infection today. Advised to continue her allergy medication as this is still pollen season. Continue singulair 10 mg daily.  ?

## 2022-01-30 ENCOUNTER — Encounter: Payer: Self-pay | Admitting: Cardiology

## 2022-02-05 NOTE — Progress Notes (Signed)
[facility] Summary Report / Loop Recorder 

## 2022-02-11 ENCOUNTER — Encounter: Payer: Self-pay | Admitting: Internal Medicine

## 2022-02-11 MED ORDER — TRIAMCINOLONE ACETONIDE 0.1 % EX CREA: 1.0000 "application " | TOPICAL_CREAM | Freq: Two times a day (BID) | CUTANEOUS | 2 refills | Status: DC

## 2022-02-13 ENCOUNTER — Telehealth: Payer: Medicare Other

## 2022-02-14 NOTE — Progress Notes (Signed)
?Electrophysiology Office Follow up Visit Note:   ? ?Date:  02/14/2022  ? ?ID:  Marissa [name], DOB 09-28-1946, MRN [id number] ? ?PCP:  [name], MD  ?[facility] Cardiologist:  [name], MD  ?[facility] Electrophysiologist:  None  ? ? ?Interval History:   ? ?Marissa [name] is a 76 y.o. female who presents for a follow up visit. They were last seen in clinic March 1 by Dr. Ellyn Hack.  I met the patient August 09, 2021 when she was hospitalized for cryptogenic stroke.  A loop recorder was implanted at that time.  After implanted loop recorder, atrial fibrillation was detected and the patient was started on Eliquis for secondary prophylaxis.  Earlier this month I reviewed her loop recorder interrogations which showed a significant increase in the atrial fibrillation burden to nearly 100% since March. ? ?She is with her husband today in clinic, previously met.  She tells me she is slowly improving still from her stroke. ? ?  ? ?Past Medical History:  ?Diagnosis Date  ? Arthritis   ? Asthma   ? Bicuspid aortic valve 05/2017  ? Likely functional bicuspid aortic valve with sclerosis and no stenosis.  ? Bursitis   ? Cataract   ? mild  ? Dysthymia   ? Hemorrhoids   ? Hx of ulcerative colitis   ? per dr Arnoldo Morale as per pt.  ? Hyperlipidemia   ? Labile hypertension   ? managed - labile.  ? Migraine   ? Scoliosis   ? Slurred speech   ? 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 Spinal stenosis   ? Stroke ([place]) 08/06/2021  ? Thoracic aortic aneurysm ([place])   ? Stable 4.2-4.3 cm (followed by Dr. Roxan Hockey)  ? Thyroid disease   ? ? ?Past Surgical History:  ?Procedure Laterality Date  ? ABDOMINAL HYSTERECTOMY    ? BREAST EXCISIONAL BIOPSY Left   ? BUNIONECTOMY    ? Cardiac Event Monitor  07/2017  ? Overall relatively normal.  Normal sinus rhythm with rare bradycardia and tachycardia.  Heart rate ranged from  55-110 bpm.  Occasional PACs and PVCs, every single 1 was felt.  No arrhythmias other than one short 4 beat run of PACs.  ? COLONOSCOPY  02/04/2005  ? all normal   ? FOOT SURGERY    ? 3 pins in toes   ? HAND SURGERY    ? left thumb joint resection  ? LOOP RECORDER INSERTION N/A 08/09/2021  ? Procedure: LOOP RECORDER INSERTION;  Surgeon: [name], MD;  Location: [facility];  Service: Cardiovascular;  Laterality: N/A;  ? nasal revision    ? NM MYOVIEW LTD  07/2017  ? LOW RISK study.  No ischemia or infarction.  EF greater than 65%.   ? SINUS IRRIGATION    ? TONSILLECTOMY AND ADENOIDECTOMY    ? TRANSTHORACIC ECHOCARDIOGRAM  06/'1, 8/'19  ? a) Moderate LVH. Normal EF 60-65%. Normal diastolic parameters. --> Difficult to fully visualize the aortic valve. Cannot exclude bicuspid valve. Mild aortic stenosis noted. No PFO. Mildly dilated left atrium. Trivial MR. No comment on mitral valve prolapse. Moderately dilated ascending aorta.;; b) F/u Echo To evaluate the aortic valve. -- Bicuspid AoV - mildly thickened / calcified. - No stenosis  ? TRANSTHORACIC ECHOCARDIOGRAM  08/08/2021  ? EF 60 to 65%.  Normal diastolic parameters?.   ?  Functional bicuspid aortic valve with R&L cusp sommisure.  Very sclerotic.  Stable gradients ~mean gradient 10 mmHg. Very Mild stenosis. Aorta dilated to 42 mm.  Stable.  RAP 3 mmHg  ? TRANSTHORACIC ECHOCARDIOGRAM  08/15/2020  ? EF 60 to 65%.  No R WMA.  GRII DD.  Mild Aortic Stenosis (mean gradient 10 mmHg).  Moderate to severe aortic root dilation (45 mm) normal IVC.  (Stable compared to February 2020)   ? ? ?Current Medications: ?No outpatient medications have been marked as taking for the 02/15/22 encounter (Appointment) with [name], MD.  ?  ? ?Allergies:   Fentanyl, Rosuvastatin, Amoxicillin, Hctz [hydrochlorothiazide], Hydralazine, Codeine, Conjugated estrogens, Erythromycin, and Piroxicam  ? ?Social History  ? ?Socioeconomic History  ? Marital status: Married   ?  Spouse name: Not on file  ? Number of children: 2  ? Years of education: Not on file  ? Highest education level: Not on file  ?Occupational History  ? Occupation: retired  ?Tobacco Use  ? Smoking status: Never  ? Smokeless tobacco: Never  ?Vaping Use  ? Vaping Use: Never used  ?Substance and Sexual Activity  ? Alcohol use: Yes  ?  Alcohol/week: 7.0 standard drinks  ?  Types: 7 Shots of liquor per week  ?  Comment: 1 drink daily club soda with burbon 08/31/2021  ? Drug use: No  ? Sexual activity: Not Currently  ?Other Topics Concern  ? Not on file  ?Social History Narrative  ? Right handed   ? Lives with husband   ? ?Social Determinants of Health  ? ?Financial Resource Strain: Not on file  ?Food Insecurity: Not on file  ?Transportation Needs: Not on file  ?Physical Activity: Not on file  ?Stress: Not on file  ?Social Connections: Not on file  ?  ? ?Family History: ?The patient's family history includes Arthritis in her mother; Breast cancer in her maternal grandmother and paternal grandmother; Diabetes in her paternal aunt; Heart disease in her maternal grandfather and maternal uncle; Leukemia in her father. There is no history of Colon cancer. ? ?ROS:   ?Please see the history of present illness.    ?All other systems reviewed and are negative. ? ?EKGs/Labs/Other Studies Reviewed:   ? ?The following studies were reviewed today: ? ?Loop interrogation personally reviewed at the bedside and shows atrial fibrillation as the current rhythm.  On a percent A-fib burden since early March. ? ? ?Recent Labs: ?08/07/2021: Magnesium 1.7 ?01/28/2022: ALT 26; BUN 17; Creatinine, Ser 0.89; Hemoglobin 11.4; Platelets 175.0; Potassium 3.8; Sodium 133; TSH 0.21  ?Recent Lipid Panel ?   ?Component Value Date/Time  ? CHOL 250 (H) 08/07/2021 0404  ? CHOL 218 (H) 04/09/2021 1016  ? TRIG 150 (H) 08/07/2021 0404  ? HDL 111 08/07/2021 0404  ? HDL 79 04/09/2021 1016  ? CHOLHDL 2.3 08/07/2021 0404  ? VLDL 30 08/07/2021 0404  ? [place] 109  (H) 08/07/2021 0404  ? LDLCALC 123 (H) 04/09/2021 1016  ? LDLDIRECT 146.0 11/21/2020 1443  ? ? ?Physical Exam:   ? ?VS:  There were no vitals taken for this visit.   ? ?Wt Readings from Last 3 Encounters:  ?01/28/22 131 lb 12.8 oz (59.8 kg)  ?12/26/21 132 lb 12.8 oz (60.2 kg)  ?11/29/21 130 lb 9.6 oz (59.2 kg)  ?  ? ?GEN:  Well nourished, well developed in no acute distress ?HEENT: Normal ?NECK: No JVD; No carotid bruits ?LYMPHATICS: No lymphadenopathy ?CARDIAC: Irregularly irregular, no murmurs, rubs,  gallops ?RESPIRATORY:  Clear to auscultation without rales, wheezing or rhonchi  ?ABDOMEN: Soft, non-tender, non-distended ?MUSCULOSKELETAL:  No edema; No deformity  ?SKIN: Warm and dry ?NEUROLOGIC:  Alert and oriented x 3 ?PSYCHIATRIC:  Normal affect  ? ? ? ?  ? ?ASSESSMENT:   ? ?1. Paroxysmal atrial fibrillation (HCC)   ?2. 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 Costochondritis   ? ?PLAN:   ? ?In order of problems listed above: ? ?#Paroxysmal atrial fibrillation ?Symptomatic.  Rapidly increasing burden noted on loop recorder interrogations.  I suspect she would feel better in normal rhythm given her history. ? ?Plan to initiate amiodarone.  Start 200 mg by mouth twice daily for 14 days followed by 200 mg by mouth daily thereafter.  She will need blood work in 6 to 8 weeks (CMP, TSH and free T4).  She may ultimately require further titration of her Synthroid given her baseline thyroid dysfunction. ? ?#Thromboembolic stroke ?Continue Xarelto for secondary prophylaxis ? ? ? ?Medication Adjustments/Labs and Tests Ordered: ?Current medicines are reviewed at length with the patient today.  Concerns regarding medicines are outlined above.  ?No orders of the defined types were placed in this encounter. ? ?No orders of the defined types were placed in this encounter. ? ? ? ?Signed, ?Lars Mage, MD, [place] ?02/14/2022 1:20 PM    ?Electrophysiology ?[place] ?

## 2022-02-15 ENCOUNTER — Encounter: Payer: Self-pay | Admitting: Cardiology

## 2022-02-15 ENCOUNTER — Ambulatory Visit: Payer: Medicare Other | Admitting: Cardiology

## 2022-02-15 VITALS — BP 114/72 | HR 56 | Ht 64.5 in | Wt 132.2 lb

## 2022-02-15 DIAGNOSIS — I480 Paroxysmal atrial fibrillation: Secondary | ICD-10-CM

## 2022-02-15 DIAGNOSIS — I639 Cerebral infarction, unspecified: Secondary | ICD-10-CM

## 2022-02-15 DIAGNOSIS — M940 Chondrocostal junction syndrome [Tietze]: Secondary | ICD-10-CM | POA: Diagnosis not present

## 2022-02-15 MED ORDER — AMIODARONE HCL 200 MG PO TABS: ORAL_TABLET | ORAL | 3 refills | Status: DC

## 2022-02-15 NOTE — Patient Instructions (Addendum)
Medication Instructions:  ?Start Amiodarone 200 mg two times a day for 14 day then 200 mg daily ?Your physician recommends that you continue on your current medications as directed. Please refer to the Current Medication list given to you today. ?*If you need a refill on your cardiac medications before your next appointment, please call your pharmacy* ? ?Lab Work: ?None. ?If you have labs (blood work) drawn today and your tests are completely normal, you will receive your results only by: ?MyChart Message (if you have MyChart) OR ?A paper copy in the mail ?If you have any lab test that is abnormal or we need to change your treatment, we will call you to review the results. ? ?Testing/Procedures: ?None. ? ?Follow-Up: ?At [facility], you and your health needs are our priority.  As part of our continuing mission to provide you with exceptional heart care, we have created designated Provider Care Teams.  These Care Teams include your primary Cardiologist (physician) and Advanced Practice Providers (APPs -  Physician Assistants and Nurse Practitioners) who all work together to provide you with the care you need, when you need it. ? ?Your physician wants you to follow-up in: 6-8 weeks in the Afib Clinic.  ? ?We recommend signing up for the patient portal called "MyChart".  Sign up information is provided on this After Visit Summary.  MyChart is used to connect with patients for Virtual Visits (Telemedicine).  Patients are able to view lab/test results, encounter notes, upcoming appointments, etc.  Non-urgent messages can be sent to your provider as well.   ?To learn more about what you can do with MyChart, go to [email or web address].   ? ?Any Other Special Instructions Will Be Listed Below (If Applicable). ? ? ? ? ?  ? ? ?

## 2022-02-18 ENCOUNTER — Ambulatory Visit: Payer: Medicare Other | Admitting: Cardiology

## 2022-02-18 ENCOUNTER — Encounter: Payer: Self-pay | Admitting: Cardiology

## 2022-02-18 DIAGNOSIS — E785 Hyperlipidemia, unspecified: Secondary | ICD-10-CM

## 2022-02-18 DIAGNOSIS — I639 Cerebral infarction, unspecified: Secondary | ICD-10-CM | POA: Diagnosis not present

## 2022-02-18 DIAGNOSIS — I350 Nonrheumatic aortic (valve) stenosis: Secondary | ICD-10-CM

## 2022-02-18 DIAGNOSIS — I4819 Other persistent atrial fibrillation: Secondary | ICD-10-CM

## 2022-02-18 DIAGNOSIS — R0989 Other specified symptoms and signs involving the circulatory and respiratory systems: Secondary | ICD-10-CM

## 2022-02-18 DIAGNOSIS — I77810 Thoracic aortic ectasia: Secondary | ICD-10-CM | POA: Diagnosis not present

## 2022-02-18 DIAGNOSIS — D6869 Other thrombophilia: Secondary | ICD-10-CM

## 2022-02-18 DIAGNOSIS — T50905S Adverse effect of unspecified drugs, medicaments and biological substances, sequela: Secondary | ICD-10-CM

## 2022-02-18 NOTE — Patient Instructions (Addendum)
Medication Instructions:  ?Stop taking Amiodarone  ? ?*If you need a refill on your cardiac medications before your next appointment, please call your pharmacy* ? ? ?Lab Work: ? ?Not needed ? ? ?Testing/Procedures: ? ?Not needed ? ?Follow-Up: ?At [facility], you and your health needs are our priority.  As part of our continuing mission to provide you with exceptional heart care, we have created designated Provider Care Teams.  These Care Teams include your primary Cardiologist (physician) and Advanced Practice Providers (APPs -  Physician Assistants and Nurse Practitioners) who all work together to provide you with the care you need, when you need it. ? ?  ? ?Your next appointment:   ?2 month(s) keep appointment ? ?The format for your next appointment:   ?In Person ? ?Provider:   ?[facility]  Afib clinic  ? ? ?Other Instructions   ? ?Dr Ellyn Hack will discuss with Dr [name] - office will contact you . ?

## 2022-02-18 NOTE — Assessment & Plan Note (Signed)
Has had stable gradients for at least a couple follow-up echoes.  Plan is to follow-up in 3 years. ?

## 2022-02-18 NOTE — Assessment & Plan Note (Signed)
Stable blood pressure now with Bystolic and amlodipine.  She rarely uses clonidine. ?

## 2022-02-18 NOTE — Assessment & Plan Note (Addendum)
Reordered MRI angiogram of the chest last visit.  Has not yet been done. ?

## 2022-02-18 NOTE — Assessment & Plan Note (Signed)
She had a significant improvement in her lipids going to on the combination of Nexletol and ezetimibe.  Would like to try to get her on Nexlizet, but I think the insurance coverage would only cover the individual medications and not the combination. ? ?She has been intolerant to just about every statin.  I suspect that maybe we can continue to get improvement and labs should be rechecked anytime now. ?

## 2022-02-18 NOTE — Progress Notes (Signed)
? ?Primary Care Provider: [name], MD ?Cardiologist: Marissa Hew, MD ?Electrophysiologist: Marissa Epley, MD ? ?Clinic Note: ?Chief Complaint  ?Patient presents with  ? Follow-up  ?  Medication reaction  ? Atrial Fibrillation  ?  Just seen by Dr. Quentin [name].  Started amiodarone.  Cannot tolerate.  ? ?=================================== ? ?ASSESSMENT/PLAN  ? ?Problem List Items Addressed This Visit   ? ?  ? Cardiology Problems  ? 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  Reordered MRI angiogram of the chest last visit.  Has not yet been done. ? ?  ?  ? Labile hypertension (Chronic)  ?  Stable blood pressure now with Bystolic and amlodipine.  She rarely uses clonidine. ? ?  ?  ? Mild aortic stenosis (Chronic)  ?  Has had stable gradients for at least a couple follow-up echoes.  Plan is to follow-up in 3 years. ? ?  ?  ? Hyperlipidemia with target LDL less than 100 (Chronic)  ?  She had a significant improvement in her lipids going to on the combination of Nexletol and ezetimibe.  Would like to try to get her on Nexlizet, but I think the insurance coverage would only cover the individual medications and not the combination. ? ?She has been intolerant to just about every statin.  I suspect that maybe we can continue to get improvement and labs should be rechecked anytime now. ? ?  ?  ? 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  History of probably thromboembolic stroke given the extensive A-fib she has developed.  Now on Zaroxolyn and tolerating it relatively well. ? ?  ?  ? Persistent atrial fibrillation (HCC) (Chronic)  ?  Increasing A-fib burden.  Now persistent A-fib.  Unfortunately Dr. Mardene [name] plan to put her on amiodarone to try to restore sinus rhythm and maintain it is probably not can work because she is not to tolerate amiodarone.  She stopped taking it after just 2 days indicating intolerable symptoms. ? ?I doubt the benefit of a 1 a AAD such as propafenone or flecainide.  She may if  necessary require either sotalol or potentially Tikosyn which would be something that Dr. Quentin [name] would think lead on.  The other question would be is that we just simply try to proceed with evaluation for ablation.  I did briefly talk about ablation with her.  She has a tendency to be intolerant of medications and therefore that may be a good option for her because she would also potentially want to not have to be on DOAC if possible. ? ?She is on Bystolic and I told her to take an extra half tablet today and tomorrow to help with her tachycardia. ? ?She continues on Xarelto at lower dose and tolerating it well. ? ?I able send a note to Dr. Quentin [name] expedited in order to get his opinion on what the neck step should be.  Question would be to be simply hospitalized for Tikosyn load or potentially sotalol versus combined ablation. ? ?  ?  ?  ? Other  ? Adverse effect of drug  ?  She has a tendency to medication interactions.  Completely intolerable effects of even 2 days worth of amiodarone.  No way she would stay on this long-term.  We will list in the allergy section. ? ?  ?  ? Secondary hypercoagulable state ([place]) (Chronic)  ?  CHA2DS2-VASc Score = 7 [CHF History: 0, HTN History: 1, Diabetes History: 0, Stroke History: 2, Vascular  Disease History: 1, Age Score: 2, Gender Score: 1].  Therefore, the patient's annual risk of stroke is 11.2 %.    ? ?Remains on Xarelto 15 mg daily. ? ?  ?  ? ? ?=================================== ? ?HPI:   ? ?Marissa [name] is a 76 y.o. female with a PMH notable for very labile HTN, mild AS (mild thoracic aortic dilation), HLD, history of syncopewho was recently diagnosed with CVA in October 2022 with mild residual dizziness and aphasia-ILR placed diagnosed Paroxysmal Atrial Fibrillation).  She presents today for 6-week follow-up of atrial fibrillation.  She was referred at the request of Marissa [name], *. ? ?I last saw Marissa [name] on December 26, 2021.  She had been followed up  a couple times by the A-fib clinic as her ILR diagnosed couple episodes of atrial fibrillation.  She been seen in November and December Her beta-blocker was converted to Bystolic and Plavix converted to Eliquis.  Still minimal A-fib burden noted.  Eventually Eliquis was converted to Xarelto because of some intolerance related to symptoms of rash.  Converted to Xarelto 15 mg daily.  She also is having significant issues with taking atorvastatin.,  That was subsequently discontinued with plans to consider reevaluation later.  She was placed on Nexletol. ?Seem very consistent with costochondritis.  I recommend using Tylenol but also give her quick pulse of prednisone taper.  The hope was that this would also treat the rash she was having-ostensibly from Eliquis. ? ?Seen on 02/15/2022 by Dr. Quentin [name].  Noted slight increase in atrial fibrillation burden to be in the 100% in March. .  Chest discomfort and palpitations noted.  Arthur arthralgias back pain and myalgia.  She noted to be actually in a regular rhythm although her monitor showed 100% A-fib burden => he desired to restore sinus rhythm.  Plan was to load with amiodarone with plans to restore and maintain sinus rhythm to see how her symptoms improved.  Continued on Xarelto. ? ?Recent Hospitalizations: None ? ?Reviewed  CV studies:   ? ?The following studies were reviewed today: (if available, images/films reviewed: From Epic Chart or Care Everywhere) ?ILR evaluation for March showed 100% A-fib.: ? ? ?Interval History:  ? ?Marissa [name] presents here today 3 days out from starting amiodarone and actually stopped it after 2 days.  She just listed about half the symptoms that can come with amiodarone including ataxia, stuttering, nausea, dyspnea weakness chills coughing, etc.  She felt horrible nauseated etc.  She took the medicine 2 days and did not take it yesterday.  She could not tolerate.  Being off today she is feeling better, but does note that her heart rate  is faster.  She is quite anxious and upset.  Her husband is with her today and is very forceful explaining her symptoms.. ? ?She was even having chest pain and discomfort going down her arms which is all better now that she has been off the medication. ? ?CV Review of Symptoms (Summary) ?Cardiovascular ROS: positive for - chest pain, irregular heartbeat, orthopnea, palpitations, rapid heart rate, shortness of breath, and nausea, ataxia, chills-without sweats, shakes, poor concentration, nausea, no appetite ?negative for - loss of consciousness, paroxysmal nocturnal dyspnea, or true symptoms of syncope or near syncope including no TIA or CVA/amaurosis fugax symptoms.  No claudication.. ? ?REVIEWED OF SYSTEMS  ? ?Review of Systems  ?Constitutional:  Positive for chills and malaise/fatigue. Negative for weight loss.  ?HENT:  Positive for congestion  and sinus pain.   ?Eyes:  Positive for blurred vision.  ?Respiratory:  Positive for cough and shortness of breath. Negative for sputum production.   ?Cardiovascular:  Positive for chest pain and palpitations. Negative for claudication and leg swelling.  ?Gastrointestinal:  Positive for nausea. Negative for abdominal pain, blood in stool, constipation, melena and vomiting.  ?Genitourinary:  Negative for dysuria and hematuria.  ?Musculoskeletal:  Positive for back pain and joint pain.  ?Neurological:  Positive for dizziness (Ataxia, unsteady gait) and weakness.  ?     Confusion  ?Psychiatric/Behavioral:  Negative for depression (More upset and depressed) and memory loss. The patient is nervous/anxious and has insomnia.   ? ?I have reviewed and (if needed) personally updated the patient's problem list, medications, allergies, past medical and surgical history, social and family history.  ? ?PAST MEDICAL HISTORY  ? ?Past Medical History:  ?Diagnosis Date  ? Arthritis   ? Asthma   ? Bicuspid aortic valve 05/2017  ? Likely functional bicuspid aortic valve with sclerosis and no  stenosis.  ? Bursitis   ? Cataract   ? mild  ? Dysthymia   ? Hemorrhoids   ? Hx of ulcerative colitis   ? per dr Arnoldo Morale as per pt.  ? Hyperlipidemia   ? Labile hypertension   ? managed - labile.  ? [place]

## 2022-02-18 NOTE — Assessment & Plan Note (Signed)
Increasing A-fib burden.  Now persistent A-fib.  Unfortunately Dr. Mardene Speak plan to put her on amiodarone to try to restore sinus rhythm and maintain it is probably not can work because she is not to tolerate amiodarone.  She stopped taking it after just 2 days indicating intolerable symptoms. ? ?I doubt the benefit of a 1 a AAD such as propafenone or flecainide.  She may if necessary require either sotalol or potentially Tikosyn which would be something that Dr. [name] would think lead on.  The other question would be is that we just simply try to proceed with evaluation for ablation.  I did briefly talk about ablation with her.  She has a tendency to be intolerant of medications and therefore that may be a good option for her because she would also potentially want to not have to be on DOAC if possible. ? ?She is on Bystolic and I told her to take an extra half tablet today and tomorrow to help with her tachycardia. ? ?She continues on Xarelto at lower dose and tolerating it well. ? ?I able send a note to Dr. [name] expedited in order to get his opinion on what the neck step should be.  Question would be to be simply hospitalized for Tikosyn load or potentially sotalol versus combined ablation. ?

## 2022-02-18 NOTE — Assessment & Plan Note (Signed)
She has a tendency to medication interactions.  Completely intolerable effects of even 2 days worth of amiodarone.  No way she would stay on this long-term.  We will list in the allergy section. ?

## 2022-02-18 NOTE — Assessment & Plan Note (Addendum)
CHA2DS2-VASc Score = 7 [CHF History: 0, HTN History: 1, Diabetes History: 0, Stroke History: 2, Vascular Disease History: 1, Age Score: 2, Gender Score: 1].  Therefore, the patient's annual risk of stroke is 11.2 %.    ? ?Remains on Xarelto 15 mg daily. ?

## 2022-02-18 NOTE — Assessment & Plan Note (Signed)
History of probably thromboembolic stroke given the extensive A-fib she has developed.  Now on Zaroxolyn and tolerating it relatively well. ?

## 2022-02-22 ENCOUNTER — Ambulatory Visit (INDEPENDENT_AMBULATORY_CARE_PROVIDER_SITE_OTHER): Payer: Medicare Other

## 2022-02-22 DIAGNOSIS — Z0000 Encounter for general adult medical examination without abnormal findings: Secondary | ICD-10-CM

## 2022-02-22 NOTE — Progress Notes (Signed)
? ?Subjective:  ? Marissa [name] is a 76 y.o. female who presents for Medicare Annual (Subsequent) preventive examination. ? ? ?I connected with Marissa [name] today by telephone and verified that I am speaking with the correct person using two identifiers. ?Location patient: home ?Location provider: work ?Persons participating in the virtual visit: patient, provider. ?  ?I discussed the limitations, risks, security and privacy concerns of performing an evaluation and management service by telephone and the availability of in person appointments. I also discussed with the patient that there may be a patient responsible charge related to this service. The patient expressed understanding and verbally consented to this telephonic visit.  ?  ?Interactive audio and video telecommunications were attempted between this provider and patient, however failed, due to patient having technical difficulties OR patient did not have access to video capability.  We continued and completed visit with audio only. ? ?  ?Review of Systems    ? ?Cardiac Risk Factors include: advanced age (>55mn, >>53women);family history of premature cardiovascular disease ? ?   ?Objective:  ?  ?Today's Vitals  ? ?There is no height or weight on file to calculate BMI. ? ? ?  02/22/2022  ?  1:32 PM 11/29/2021  ?  2:54 PM 08/06/2021  ? 12:43 PM 08/03/2021  ?  4:27 PM 01/19/2021  ?  1:24 PM 11/01/2020  ? 10:30 AM 07/27/2020  ?  1:31 PM  ?Advanced Directives  ?Does Patient Have a Medical Advance Directive? Yes Yes No Yes Yes Yes Yes  ?Type of [place] will  Living will;Healthcare [place] of [place];Out of facility DNR (pink MOST or yellow form) Living will;Healthcare Power of Attorney  ?Does patient want to make changes to medical advance directive?       No - Patient declined  ?Copy of [place] Chart? No - copy requested      No - copy  requested  ?Would patient like information on creating a medical advance directive?   No - Guardian declined      ? ? ?Current Medications (verified) ?Outpatient Encounter Medications as of 02/22/2022  ?Medication Sig  ? albuterol (VENTOLIN HFA) 108 (90 Base) MCG/ACT inhaler INHALE 2 PUFFS INTO THE LUNGS EVERY 6 HOURS AS NEEDED FOR WHEEZING OR SHORTNESS OF BREATH  ? ALPRAZolam (XANAX) 0.5 MG tablet TAKE 1 TABLET(0.5 MG) BY MOUTH TWICE DAILY AS NEEDED FOR ANXIETY  ? amLODipine (NORVASC) 10 MG tablet TAKE 1 TABLET(10 MG) BY MOUTH DAILY  ? carisoprodol (SOMA) 350 MG tablet TAKE 1 TABLET(350 MG) BY MOUTH THREE TIMES DAILY AS NEEDED FOR MUSCLE SPASMS  ? cloNIDine (CATAPRES) 0.2 MG tablet Take 0.1 mg by mouth 2 (two) times daily as needed (systolic over 1053. if blood pressure is great than 1976systolic for more than an hour  ? Eszopiclone 3 MG TABS Take 1 tablet (3 mg total) by mouth at bedtime. Take immediately before bedtime  ? ezetimibe (ZETIA) 10 MG tablet TAKE 1 TABLET(10 MG) BY MOUTH DAILY  ? feeding supplement, GLUCERNA SHAKE, (GLUCERNA SHAKE) LIQD Take 237 mLs by mouth daily as needed (if not eating meals).  ? FLUoxetine (PROZAC) 40 MG capsule Take 1 capsule (40 mg total) by mouth daily.  ? levothyroxine (SYNTHROID) 125 MCG tablet TAKE 1 TABLET(125 MCG) BY MOUTH DAILY  ? LYLLANA 0.05 MG/24HR patch Place onto the skin.  ? montelukast (SINGULAIR) 10 MG tablet TAKE  1 TABLET(10 MG) BY MOUTH AT BEDTIME  ? MYRBETRIQ 50 MG TB24 tablet TAKE 1 TABLET(50 MG) BY MOUTH DAILY  ? nebivolol (BYSTOLIC) 10 MG tablet TAKE 1 TABLET(10 MG) BY MOUTH DAILY AT 8 PM  ? NEXLETOL 180 MG TABS Take by mouth daily.  ? Respiratory Therapy Supplies (FLUTTER) DEVI Use as directed  ? Rivaroxaban (XARELTO) 15 MG TABS tablet Take 1 tablet (15 mg total) by mouth daily with supper. STOP ELIQUIS  ? triamcinolone cream (KENALOG) 0.1 % Apply 1 application. topically 2 (two) times daily.  ? ?No facility-administered encounter medications on file as of  02/22/2022.  ? ? ?Allergies (verified) ?Amiodarone, Fentanyl, Rosuvastatin, Amoxicillin, Hctz [hydrochlorothiazide], Hydralazine, Codeine, Conjugated estrogens, Erythromycin, and Piroxicam  ? ?History: ?Past Medical History:  ?Diagnosis Date  ? Arthritis   ? Asthma   ? Bicuspid aortic valve 05/2017  ? Likely functional bicuspid aortic valve with sclerosis and no stenosis.  ? Bursitis   ? Cataract   ? mild  ? Dysthymia   ? Hemorrhoids   ? Hx of ulcerative colitis   ? per dr Arnoldo Morale as per pt.  ? Hyperlipidemia   ? Labile hypertension   ? managed - labile.  ? Migraine   ? Scoliosis   ? Slurred speech   ? 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 Spinal stenosis   ? Stroke ([place]) 08/06/2021  ? Thoracic aortic aneurysm ([place])   ? Stable 4.2-4.3 cm (followed by Dr. Roxan Hockey)  ? Thyroid disease   ? ?Past Surgical History:  ?Procedure Laterality Date  ? ABDOMINAL HYSTERECTOMY    ? BREAST EXCISIONAL BIOPSY Left   ? BUNIONECTOMY    ? Cardiac Event Monitor  07/2017  ? Overall relatively normal.  Normal sinus rhythm with rare bradycardia and tachycardia.  Heart rate ranged from 55-110 bpm.  Occasional PACs and PVCs, every single 1 was felt.  No arrhythmias other than one short 4 beat run of PACs.  ? COLONOSCOPY  02/04/2005  ? all normal   ? FOOT SURGERY    ? 3 pins in toes   ? HAND SURGERY    ? left thumb joint resection  ? LOOP RECORDER INSERTION N/A 08/09/2021  ? Procedure: LOOP RECORDER INSERTION;  Surgeon: [name], MD;  Location: [facility];  Service: Cardiovascular;  Laterality: N/A;  ? nasal revision    ? NM MYOVIEW LTD  07/2017  ? LOW RISK study.  No ischemia or infarction.  EF greater than 65%.   ? SINUS IRRIGATION    ? TONSILLECTOMY AND ADENOIDECTOMY    ? TRANSTHORACIC ECHOCARDIOGRAM  06/'1, 8/'19  ? a) Moderate LVH. Normal EF 60-65%. Normal diastolic parameters. --> Difficult to fully visualize the  aortic valve. Cannot exclude bicuspid valve. Mild aortic stenosis noted. No PFO. Mildly dilated left atrium. Trivial MR. No comment on mitral valve prolapse. Moderately dilated ascending aorta.;; b) F/u Echo To evaluate the aortic valve. -- Bicuspid AoV - mildly thickened / calcified. - No stenosis  ? TRANSTHORACIC ECHOCARDIOGRAM  08/08/2021  ? EF 60 to 65%.  Normal diastolic parameters?.   ?  Functional bicuspid aortic valve with R&L cusp sommisure.  Very sclerotic.  Stable gradients ~mean gradient 10 mmHg. Very Mild stenosis. Aorta dilated to 42 mm.  Stable.  RAP 3 mmHg  ? TRANSTHORACIC ECHOCARDIOGRAM  08/15/2020  ? EF 60 to 65%.  No R WMA.  GRII DD.  Mild Aortic Stenosis (mean gradient 10 mmHg).  Moderate to severe aortic root dilation (45 mm) normal IVC.  (Stable compared to February 2020)   ? ?Family History  ?Problem Relation Age of Onset  ? Arthritis Mother   ? Leukemia Father   ? Heart disease Maternal Uncle   ? Diabetes Paternal Aunt   ? Breast cancer Maternal Grandmother   ? Heart disease Maternal Grandfather   ? Breast cancer Paternal Grandmother   ? Colon cancer Neg Hx   ? ?Social History  ? ?Socioeconomic History  ? Marital status: Married  ?  Spouse name: Not on file  ? Number of children: 2  ? Years of education: Not on file  ? Highest education level: Not on file  ?Occupational History  ? Occupation: retired  ?Tobacco Use  ? Smoking status: Never  ? Smokeless tobacco: Never  ?Vaping Use  ? Vaping Use: Never used  ?Substance and Sexual Activity  ? Alcohol use: Yes  ?  Alcohol/week: 7.0 standard drinks  ?  Types: 7 Shots of liquor per week  ?  Comment: 1 drink daily club soda with burbon 08/31/2021  ? Drug use: No  ? Sexual activity: Not Currently  ?Other Topics Concern  ? Not on file  ?Social History Narrative  ? Right handed   ? Lives with husband   ? ?Social Determinants of Health  ? ?Financial Resource Strain: Low Risk   ? Difficulty of Paying Living Expenses: Not hard at all  ?Food Insecurity: No  Food Insecurity  ? Worried About Charity fundraiser in the Last Year: Never true  ? Ran Out of Food in the Last Year: Never true  ?Transportation Needs: No Transportation Needs  ? Lack of Transportation [employer]

## 2022-02-22 NOTE — Patient Instructions (Signed)
Marissa [name] , ?Thank you for taking time to come for your Medicare Wellness Visit. I appreciate your ongoing commitment to your health goals. Please review the following plan we discussed and let me know if I can assist you in the future.  ? ?Screening recommendations/referrals: ?Colonoscopy: no longer required  ?Mammogram: no longer required  ?Bone Density: 02/02/2015 ?Recommended yearly ophthalmology/optometry visit for glaucoma screening and checkup ?Recommended yearly dental visit for hygiene and checkup ? ?Vaccinations: ?Influenza vaccine: completed  ?Pneumococcal vaccine: completed  ?Tdap vaccine: due  ?Shingles vaccine: will consider    ? ?Advanced directives: yes  ? ?Conditions/risks identified: none  ? ?Next appointment: none  ? ? ?Preventive Care 77 Years and Older, Female ?Preventive care refers to lifestyle choices and visits with your health care provider that can promote health and wellness. ?What does preventive care include? ?A yearly physical exam. This is also called an annual well check. ?Dental exams once or twice a year. ?Routine eye exams. Ask your health care provider how often you should have your eyes checked. ?Personal lifestyle choices, including: ?Daily care of your teeth and gums. ?Regular physical activity. ?Eating a healthy diet. ?Avoiding tobacco and drug use. ?Limiting alcohol use. ?Practicing safe sex. ?Taking low-dose aspirin every day. ?Taking vitamin and mineral supplements as recommended by your health care provider. ?What happens during an annual well check? ?The services and screenings done by your health care provider during your annual well check will depend on your age, overall health, lifestyle risk factors, and family history of disease. ?Counseling  ?Your health care provider may ask you questions about your: ?Alcohol use. ?Tobacco use. ?Drug use. ?Emotional well-being. ?Home and relationship well-being. ?Sexual activity. ?Eating habits. ?History of falls. ?Memory and  ability to understand (cognition). ?Work and work Statistician. ?Reproductive health. ?Screening  ?You may have the following tests or measurements: ?Height, weight, and BMI. ?Blood pressure. ?Lipid and cholesterol levels. These may be checked every 5 years, or more frequently if you are over 18 years old. ?Skin check. ?Lung cancer screening. You may have this screening every year starting at age 62 if you have a 30-pack-year history of smoking and currently smoke or have quit within the past 15 years. ?Fecal occult blood test (FOBT) of the stool. You may have this test every year starting at age 52. ?Flexible sigmoidoscopy or colonoscopy. You may have a sigmoidoscopy every 5 years or a colonoscopy every 10 years starting at age 36. ?Hepatitis C blood test. ?Hepatitis B blood test. ?Sexually transmitted disease (STD) testing. ?Diabetes screening. This is done by checking your blood sugar (glucose) after you have not eaten for a while (fasting). You may have this done every 1-3 years. ?Bone density scan. This is done to screen for osteoporosis. You may have this done starting at age 19. ?Mammogram. This may be done every 1-2 years. Talk to your health care provider about how often you should have regular mammograms. ?Talk with your health care provider about your test results, treatment options, and if necessary, the need for more tests. ?Vaccines  ?Your health care provider may recommend certain vaccines, such as: ?Influenza vaccine. This is recommended every year. ?Tetanus, diphtheria, and acellular pertussis (Tdap, Td) vaccine. You may need a Td booster every 10 years. ?Zoster vaccine. You may need this after age 11. ?Pneumococcal 13-valent conjugate (PCV13) vaccine. One dose is recommended after age 62. ?Pneumococcal polysaccharide (PPSV23) vaccine. One dose is recommended after age 23. ?Talk to your health care provider about which screenings  and vaccines you need and how often you need them. ?This information is  not intended to replace advice given to you by your health care provider. Make sure you discuss any questions you have with your health care provider. ?Document Released: 11/10/2015 Document Revised: 07/03/2016 Document Reviewed: 08/15/2015 ?Elsevier Interactive Patient Education ? 2017 [place]. ? ?Fall Prevention in the Home ?Falls can cause injuries. They can happen to people of all ages. There are many things you can do to make your home safe and to help prevent falls. ?What can I do on the outside of my home? ?Regularly fix the edges of walkways and driveways and fix any cracks. ?Remove anything that might make you trip as you walk through a door, such as a raised step or threshold. ?Trim any bushes or trees on the path to your home. ?Use bright outdoor lighting. ?Clear any walking paths of anything that might make someone trip, such as rocks or tools. ?Regularly check to see if handrails are loose or broken. Make sure that both sides of any steps have handrails. ?Any raised decks and porches should have guardrails on the edges. ?Have any leaves, snow, or ice cleared regularly. ?Use sand or salt on walking paths during winter. ?Clean up any spills in your garage right away. This includes oil or grease spills. ?What can I do in the bathroom? ?Use night lights. ?Install grab bars by the toilet and in the tub and shower. Do not use towel bars as grab bars. ?Use non-skid mats or decals in the tub or shower. ?If you need to sit down in the shower, use a plastic, non-slip stool. ?Keep the floor dry. Clean up any water that spills on the floor as soon as it happens. ?Remove soap buildup in the tub or shower regularly. ?Attach bath mats securely with double-sided non-slip rug tape. ?Do not have throw rugs and other things on the floor that can make you trip. ?What can I do in the bedroom? ?Use night lights. ?Make sure that you have a light by your bed that is easy to reach. ?Do not use any sheets or blankets that  are too big for your bed. They should not hang down onto the floor. ?Have a firm chair that has side arms. You can use this for support while you get dressed. ?Do not have throw rugs and other things on the floor that can make you trip. ?What can I do in the kitchen? ?Clean up any spills right away. ?Avoid walking on wet floors. ?Keep items that you use a lot in easy-to-reach places. ?If you need to reach something above you, use a strong step stool that has a grab bar. ?Keep electrical cords out of the way. ?Do not use floor polish or wax that makes floors slippery. If you must use wax, use non-skid floor wax. ?Do not have throw rugs and other things on the floor that can make you trip. ?What can I do with my stairs? ?Do not leave any items on the stairs. ?Make sure that there are handrails on both sides of the stairs and use them. Fix handrails that are broken or loose. Make sure that handrails are as long as the stairways. ?Check any carpeting to make sure that it is firmly attached to the stairs. Fix any carpet that is loose or worn. ?Avoid having throw rugs at the top or bottom of the stairs. If you do have throw rugs, attach them to the floor with carpet tape. ?  Make sure that you have a light switch at the top of the stairs and the bottom of the stairs. If you do not have them, ask someone to add them for you. ?What else can I do to help prevent falls? ?Wear shoes that: ?Do not have high heels. ?Have rubber bottoms. ?Are comfortable and fit you well. ?Are closed at the toe. Do not wear sandals. ?If you use a stepladder: ?Make sure that it is fully opened. Do not climb a closed stepladder. ?Make sure that both sides of the stepladder are locked into place. ?Ask someone to hold it for you, if possible. ?Clearly mark and make sure that you can see: ?Any grab bars or handrails. ?First and last steps. ?Where the edge of each step is. ?Use tools that help you move around (mobility aids) if they are needed. These  include: ?Canes. ?Walkers. ?Scooters. ?Crutches. ?Turn on the lights when you go into a dark area. Replace any light bulbs as soon as they burn out. ?Set up your furniture so you have a clear path. Avoid mo

## 2022-02-25 ENCOUNTER — Other Ambulatory Visit: Payer: Self-pay | Admitting: Internal Medicine

## 2022-02-26 ENCOUNTER — Ambulatory Visit (INDEPENDENT_AMBULATORY_CARE_PROVIDER_SITE_OTHER): Payer: Medicare Other

## 2022-02-26 DIAGNOSIS — I4819 Other persistent atrial fibrillation: Secondary | ICD-10-CM

## 2022-02-27 LAB — CUP PACEART REMOTE DEVICE CHECK
Date Time Interrogation Session: 20230503072709
Implantable Pulse Generator Implant Date: 20221014

## 2022-03-03 ENCOUNTER — Encounter (INDEPENDENT_AMBULATORY_CARE_PROVIDER_SITE_OTHER): Payer: Medicare Other | Admitting: Cardiology

## 2022-03-03 DIAGNOSIS — R0989 Other specified symptoms and signs involving the circulatory and respiratory systems: Secondary | ICD-10-CM

## 2022-03-03 DIAGNOSIS — E785 Hyperlipidemia, unspecified: Secondary | ICD-10-CM | POA: Diagnosis not present

## 2022-03-03 DIAGNOSIS — I4819 Other persistent atrial fibrillation: Secondary | ICD-10-CM

## 2022-03-03 DIAGNOSIS — D6869 Other thrombophilia: Secondary | ICD-10-CM

## 2022-03-03 DIAGNOSIS — W19XXXA Unspecified fall, initial encounter: Secondary | ICD-10-CM

## 2022-03-03 DIAGNOSIS — R270 Ataxia, unspecified: Secondary | ICD-10-CM

## 2022-03-03 DIAGNOSIS — Y92009 Unspecified place in unspecified non-institutional (private) residence as the place of occurrence of the external cause: Secondary | ICD-10-CM

## 2022-03-04 ENCOUNTER — Telehealth: Payer: Self-pay

## 2022-03-04 ENCOUNTER — Encounter: Payer: Self-pay | Admitting: Cardiology

## 2022-03-04 DIAGNOSIS — R270 Ataxia, unspecified: Secondary | ICD-10-CM | POA: Insufficient documentation

## 2022-03-04 NOTE — Progress Notes (Signed)
? ? ?Chronic Care Management ?Pharmacy Assistant  ? ?Name: Marissa [name]  MRN: [id number] DOB: 1945-12-14 ? ?Reason for Encounter: Disease State-General ?  ? ?Recent office visits:  ?01/28/22 Marissa Koch, MD-PCP (Paroxysmal atrial fibrillation) Orders: CBC,CMP,TSH; Medication changes: None ? ?Recent consult visits:  ?02/18/22 Marissa Man, MD-Cardiology (Persistent Atrial Fibrillation) No orders; Medication changes: stop amiodarone 200 mg ? ?02/15/22 Marissa Mage T, MD-Cardiology (Persistent Atrial Fibrillation) No orders; Medication changes: start amiodarone 200 mg Bid for 14 days then 200 mg daily ? ?12/26/21 Marissa Man, MD-Cardiology (Paroxysmal atrial fibrillation) EKG ordered; Medication change: Stop Eliquis, Start Xarelto 15 mg  at 6 pm,   Weaning taper  of Prednisone take with food  ?Take 60 mg ( 6 tablets of 10 mg ) once a day for 3 days ?Take 40 mg( 4 tablets of 10 mg) once a day for 3 days ?Take 20 mg ( 2 tablets of 10 mg ) once a day for 3 days  ?Take 10 mg ( 1 tablet of 10 mg ) once a day for 3 days then stop ? ?Hospital visits:  ?None in previous 6 months ? ?Medications: ?Outpatient Encounter Medications as of 03/04/2022  ?Medication Sig  ? albuterol (VENTOLIN HFA) 108 (90 Base) MCG/ACT inhaler INHALE 2 PUFFS INTO THE LUNGS EVERY 6 HOURS AS NEEDED FOR WHEEZING OR SHORTNESS OF BREATH  ? ALPRAZolam (XANAX) 0.5 MG tablet TAKE 1 TABLET(0.5 MG) BY MOUTH TWICE DAILY AS NEEDED FOR ANXIETY  ? amLODipine (NORVASC) 10 MG tablet TAKE 1 TABLET(10 MG) BY MOUTH DAILY  ? carisoprodol (SOMA) 350 MG tablet TAKE 1 TABLET(350 MG) BY MOUTH THREE TIMES DAILY AS NEEDED FOR MUSCLE SPASMS  ? cloNIDine (CATAPRES) 0.2 MG tablet Take 0.1 mg by mouth 2 (two) times daily as needed (systolic over 749). if blood pressure is great than 449 systolic for more than an hour  ? Eszopiclone 3 MG TABS Take 1 tablet (3 mg total) by mouth at bedtime. Take immediately before bedtime  ? ezetimibe (ZETIA) 10 MG tablet TAKE 1  TABLET(10 MG) BY MOUTH DAILY  ? feeding supplement, GLUCERNA SHAKE, (GLUCERNA SHAKE) LIQD Take 237 mLs by mouth daily as needed (if not eating meals).  ? FLUoxetine (PROZAC) 40 MG capsule Take 1 capsule (40 mg total) by mouth daily.  ? levothyroxine (SYNTHROID) 125 MCG tablet TAKE 1 TABLET(125 MCG) BY MOUTH DAILY  ? LYLLANA 0.05 MG/24HR patch Place onto the skin.  ? montelukast (SINGULAIR) 10 MG tablet TAKE 1 TABLET(10 MG) BY MOUTH AT BEDTIME  ? MYRBETRIQ 50 MG TB24 tablet TAKE 1 TABLET(50 MG) BY MOUTH DAILY  ? nebivolol (BYSTOLIC) 10 MG tablet TAKE 1 TABLET(10 MG) BY MOUTH DAILY AT 8 PM  ? NEXLETOL 180 MG TABS Take by mouth daily.  ? Respiratory Therapy Supplies (FLUTTER) DEVI Use as directed  ? Rivaroxaban (XARELTO) 15 MG TABS tablet Take 1 tablet (15 mg total) by mouth daily with supper. STOP ELIQUIS  ? triamcinolone cream (KENALOG) 0.1 % Apply 1 application. topically 2 (two) times daily.  ? ?No facility-administered encounter medications on file as of 03/04/2022.  ? ?[facility] for [facility] Review Call ? ? ?Chart Review: ? ?Have there been any documented new, changed, or discontinued medications since last visit? Yes (If yes, include name, dose, frequency, date)12/26/21 Stop Eliquis, Start Xarelto 15 mg  at 6 pm,   Weaning taper  of Prednisone take with food  ?Take 60 mg ( 6 tablets of 10  mg ) once a day for 3 days ?Take 40 mg( 4 tablets of 10 mg) once a day for 3 days ?Take 20 mg ( 2 tablets of 10 mg ) once a day for 3 days  ?Take 10 mg ( 1 tablet of 10 mg ) once a day for 3 days then stop ?02/15/22  start amiodarone 200 mg Bid for 14 days then 200 mg daily ?02/18/22 stop amiodarone 200 mg ?Has there been any documented recent hospitalizations or ED visits since last visit with Clinical Pharmacist? No ?Brief Summary (including medication and/or Diagnosis changes): ? ? ?Adherence Review: ? ?Does the Clinical Pharmacist Assistant have access to adherence rates? Yes ?Adherence rates for STAR metric  medications (List medication(s)/day supply/ last 2 fill dates). ?Adherence rates for medications indicated for disease state being reviewed (List medication(s)/day supply/ last 2 fill dates). ?Does the patient have >5 day gap between last estimated fill dates for any of the above medications or other medication gaps? No ?Reason for medication gaps. ? ? ?Disease State Questions: ? ?Able to connect with Patient? Yes,spoke with patient husband Marissa [name] ?Did patient have any problems with their health recently? No ?Note problems and Concerns: ?Have you had any admissions or emergency room visits or worsening of your condition(s) since last visit? No ?Details of ED visit, hospital visit and/or worsening condition(s): ?Have you had any visits with new specialists or providers since your last visit? Yes ?Explain:Follow up to afib ?Have you had any new health care problem(s) since your last visit? No ?New problem(s) reported: ?Have you run out of any of your medications since you last spoke with clinical pharmacist? No ?What caused you to run out of your medications? ?Are there any medications you are not taking as prescribed? No ?What kept you from taking your medications as prescribed? ?Are you having any issues or side effects with your medications? No ?Note of issues or side effects: ?Do you have any other health concerns or questions you want to discuss with your Clinical Pharmacist before your next visit? No ?Note additional concerns and questions from Patient. ?Are there any health concerns that you feel we can do a better job addressing? No ?Note Patient's response. ?Are you having any problems with any of the following since the last visit: (select all that apply) ? Other ? Details:Patient has a lot of issues with remembering things but husband helps with them ?12. Any falls since last visit? No ? Details: ?13. Any increased or uncontrolled pain since last visit? No ? Details: ?14. Next visit Type: telephone ?       Visit with:Clinical Pharmacist ?       Date:04/12/22 ?       Time:9am ? ?15. Additional Details? No   ? ?Care Gaps: ?Colonoscopy-NA ?Diabetic Foot Exam-NA ?Mammogram-NA ?Ophthalmology-NA ?Dexa Scan - NA ?Annual Well Visit - NA ?Micro albumin-NA ?Hemoglobin A1c- 08/07/21 ?  ?Star Rating Drugs: ?None ID ?  ?Ethelene Hal ?Clinical Pharmacist Assistant ?[phone]  ?

## 2022-03-04 NOTE — Progress Notes (Signed)
? ?MY CHART ENCOUNTER NOTE: ? ?Marissa [name] is a 76 year old female with a history notable for labile hypertension and recently diagnosed paroxysmal atrial fibrillation found on ILR placed in October 2022 with an episode of syncope followed by aphasia. ? ?She was seen on several occasions by the A-fib clinic.  Beta-blocker converted to Bystolic and Plavix was converted to Eliquis.  Subsequently we converted her from Eliquis to Xarelto because of intolerance issues.  Taking 15 mg daily.  We also DC'd atorvastatin and start her on Nexletol.  She has symptoms very consistent with costochondritis treated with a prednisone taper. ? ?She was seen by Dr. [name] from EP on April 21 noting increased atrial fibrillation on loop recorder to the point where she was 100% A-fib for the month of March.  She noted chest discomfort and palpitations as well as arthralgias and myalgias.  In an effort to restore sinus rhythm, plan was to load her on amiodarone to allow conversion to sinus rhythm. ? ?She was seen by me on 02/18/2022, stating that after taking 3 days of amiodarone she started having significant issues with ataxia, stuttering, nausea and dyspnea etc.  She is feeling horrible.  She actually only took 2 days worth before stopping.  She felt better after stopping but noted that her heart rate was faster.  Very anxious and upset.  Having chest discomfort => chills malaise cough weakness palpitations etc.  Ataxia and unsteady gait with confusion. ?Amiodarone discontinued with the plan to consider other options for ?She has been converted from statin to combination of Nexletol and ezetimibe.  Blood pressure is significant better on combination of Bystolic and amlodipine.  Rare use of clonidine for labile blood pressure. ? ?MyChart message: ?Still having questions about what to do about atrial fibrillation. ?When seen in our clinic visit, we stopped amiodarone that had been started by Dr. [name] because of significant  symptoms. ? ?She is still having weakness and shakiness with trouble walking.  Even with a walker.  She has had a fall.  Also noted blood pressure being low. ? ?Past Medical History:  ?Diagnosis Date  ? Arthritis   ? Asthma   ? Bicuspid aortic valve 05/2017  ? Likely functional bicuspid aortic valve with sclerosis and no stenosis.  ? Bursitis   ? Cataract   ? mild  ? Dysthymia   ? Hemorrhoids   ? Hx of ulcerative colitis   ? per dr Arnoldo Morale as per pt.  ? Hyperlipidemia   ? Labile hypertension   ? managed - labile.  ? Migraine   ? Scoliosis   ? Slurred speech   ? 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 Spinal stenosis   ? Stroke ([place]) 08/06/2021  ? Thoracic aortic aneurysm ([place])   ? Stable 4.2-4.3 cm (followed by Dr. Roxan Hockey)  ? Thyroid disease   ? ?Current Meds  ?Medication Sig  ? albuterol (VENTOLIN HFA) 108 (90 Base) MCG/ACT inhaler INHALE 2 PUFFS INTO THE LUNGS EVERY 6 HOURS AS NEEDED FOR WHEEZING OR SHORTNESS OF BREATH  ? ALPRAZolam (XANAX) 0.5 MG tablet TAKE 1 TABLET(0.5 MG) BY MOUTH TWICE DAILY AS NEEDED FOR ANXIETY  ? amLODipine (NORVASC) 10 MG tablet TAKE 1 TABLET(10 MG) BY MOUTH DAILY  ? carisoprodol (SOMA) 350 MG tablet TAKE 1 TABLET(350 MG) BY MOUTH THREE TIMES DAILY AS NEEDED FOR MUSCLE SPASMS  ? cloNIDine (  CATAPRES) 0.2 MG tablet Take 0.1 mg by mouth 2 (two) times daily as needed (systolic over 546). if blood pressure is great than 568 systolic for more than an hour  ? Eszopiclone 3 MG TABS Take 1 tablet (3 mg total) by mouth at bedtime. Take immediately before bedtime  ? ezetimibe (ZETIA) 10 MG tablet TAKE 1 TABLET(10 MG) BY MOUTH DAILY  ? feeding supplement, GLUCERNA SHAKE, (GLUCERNA SHAKE) LIQD Take 237 mLs by mouth daily as needed (if not eating meals).  ? FLUoxetine (PROZAC) 40 MG capsule Take 1 capsule (40 mg total) by mouth daily.  ? levothyroxine (SYNTHROID) 125 MCG tablet TAKE 1 TABLET(125  MCG) BY MOUTH DAILY  ? LYLLANA 0.05 MG/24HR patch Place onto the skin.  ? montelukast (SINGULAIR) 10 MG tablet TAKE 1 TABLET(10 MG) BY MOUTH AT BEDTIME  ? MYRBETRIQ 50 MG TB24 tablet TAKE 1 TABLET(50 MG) BY MOUTH DAILY  ? nebivolol (BYSTOLIC) 10 MG tablet TAKE 1 TABLET(10 MG) BY MOUTH DAILY AT 8 PM  ? NEXLETOL 180 MG TABS Take by mouth daily.  ? Respiratory Therapy Supplies (FLUTTER) DEVI Use as directed  ? Rivaroxaban (XARELTO) 15 MG TABS tablet Take 1 tablet (15 mg total) by mouth daily with supper. STOP ELIQUIS  ? triamcinolone cream (KENALOG) 0.1 % Apply 1 application. topically 2 (two) times daily.  ? ? ? ?Problem List Items Addressed This Visit   ? ?  ? Cardiology Problems  ? Labile hypertension (Chronic)  ? Hyperlipidemia with target LDL less than 100 (Chronic)  ? Persistent atrial fibrillation (HCC) - Primary (Chronic)  ?  ? Other  ? Ataxia  ? Fall at home, initial encounter  ? 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  ? ?Plan for now: ?With hypotension, hold amlodipine, continue Bystolic ?Discontinue both Nexletol and ezetimibe ?Continue Xarelto ?We will send note to Dr. Albertina Parr consider the possibility of ablation to avoid medications. ?If symptoms of ataxia etc. continue with higher blood pressures off of amlodipine, I do think that some of these symptoms do not sound cardiac in nature and probably would benefit from both PCP and neurology evaluation. ?Have not received definitive answer from Dr. [name] as of yet.  We will forward this note to him. ?Would be best to schedule appointment to better assess the symptoms blood pressures need to change stopping amlodipine ? ?Also - may need to hold Soma as well.  ? ?[name], MD ? ?Time on Encounter 25 min ? ? ?

## 2022-03-04 NOTE — Patient Instructions (Addendum)
Medication Instructions:  ? ?Plan for now: ?With hypotension, hold amlodipine, continue Bystolic ?Discontinue both Nexletol and ezetimibe ?Continue Xarelto ?We will send note to Dr. Albertina Parr consider the possibility of ablation to avoid medications. ?If symptoms of ataxia etc. continue with higher blood pressures off of amlodipine, I do think that some of these symptoms do not sound cardiac in nature and probably would benefit from both PCP and neurology evaluation. ?Have not received definitive answer from Dr. [name] as of yet.  We will forward this note to him. ?Would be best to schedule appointment to better assess the symptoms blood pressures need to change stopping amlodipine ?Also - may need to hold Soma as well (but this is not my medication to change). ? ?*If you need a refill on your cardiac medications before your next appointment, please call your pharmacy* ? ? ?Lab Work: ?none ?If you have labs (blood work) drawn today and your tests are completely normal, you will receive your results only by: ?MyChart Message (if you have MyChart) OR ?A paper copy in the mail ?If you have any lab test that is abnormal or we need to change your treatment, we will call you to review the results. ? ? ?Testing/Procedures: ?none ? ? ?Follow-Up: ?At [facility], you and your health needs are our priority.  As part of our continuing mission to provide you with exceptional heart care, we have created designated Provider Care Teams.  These Care Teams include your primary Cardiologist (physician) and Advanced Practice Providers (APPs -  Physician Assistants and Nurse Practitioners) who all work together to provide you with the care you need, when you need it. ? ?We recommend signing up for the patient portal called "MyChart".  Sign up information is provided on this After Visit Summary.  MyChart is used to connect with patients for Virtual Visits (Telemedicine).  Patients are able to view lab/test results, encounter  notes, upcoming appointments, etc.  Non-urgent messages can be sent to your provider as well.   ?To learn more about what you can do with MyChart, go to [email or web address].   ? ?Your next appointment:   ?Mar 13, 2022 -> 10 AM appt.  ? ?The format for your next appointment:   ?In Person ? ?Provider:   ?[name], MD   ? ? ?Other Instructions ?Make sure you are eating & drinking appropriately -- labile blood pressures are adversely effected by being dehydrated. ? ? ?[name], MD ? ? ? ? ? ? ? ?

## 2022-03-05 NOTE — Addendum Note (Signed)
Addended by: Ma Hillock on: 03/05/2022 09:16 AM ? ? Modules accepted: Orders ? ?

## 2022-03-05 NOTE — Telephone Encounter (Signed)
Spoke with husband Ed about doctor's recommendation to hold Amlodipine, Nexletol, and Ezetimibe. Also advised that she may benefit from holding the carisoprodol, but that we cannot make recommendation on that as it is not a cardiology medication and not prescribed by [place]. Advised that she continue to check her blood pressure to see how it's doing off the amlodipine and if there are any other issues or symptoms to call office and schedule appt. Informed husband that we have sent message to Dr [name] on potential procedure date and will contact him once we know something. ?

## 2022-03-11 ENCOUNTER — Encounter: Payer: Self-pay | Admitting: Cardiology

## 2022-03-11 ENCOUNTER — Encounter: Payer: Self-pay | Admitting: Internal Medicine

## 2022-03-12 MED ORDER — ARIPIPRAZOLE 2 MG PO TABS: 2.0000 mg | ORAL_TABLET | Freq: Every day | ORAL | 3 refills | Status: DC

## 2022-03-13 ENCOUNTER — Ambulatory Visit: Payer: Medicare Other | Admitting: Cardiology

## 2022-03-13 ENCOUNTER — Encounter: Payer: Self-pay | Admitting: Cardiology

## 2022-03-13 ENCOUNTER — Encounter: Payer: Self-pay | Admitting: Internal Medicine

## 2022-03-13 VITALS — BP 126/74 | HR 83 | Ht 64.0 in | Wt 129.0 lb

## 2022-03-13 DIAGNOSIS — I350 Nonrheumatic aortic (valve) stenosis: Secondary | ICD-10-CM | POA: Diagnosis not present

## 2022-03-13 DIAGNOSIS — I4819 Other persistent atrial fibrillation: Secondary | ICD-10-CM

## 2022-03-13 DIAGNOSIS — E785 Hyperlipidemia, unspecified: Secondary | ICD-10-CM

## 2022-03-13 DIAGNOSIS — R0989 Other specified symptoms and signs involving the circulatory and respiratory systems: Secondary | ICD-10-CM

## 2022-03-13 DIAGNOSIS — I700 Atherosclerosis of aorta: Secondary | ICD-10-CM

## 2022-03-13 DIAGNOSIS — D6869 Other thrombophilia: Secondary | ICD-10-CM

## 2022-03-13 DIAGNOSIS — I77810 Thoracic aortic ectasia: Secondary | ICD-10-CM

## 2022-03-13 DIAGNOSIS — T50905S Adverse effect of unspecified drugs, medicaments and biological substances, sequela: Secondary | ICD-10-CM

## 2022-03-13 DIAGNOSIS — M940 Chondrocostal junction syndrome [Tietze]: Secondary | ICD-10-CM

## 2022-03-13 MED ORDER — METOPROLOL TARTRATE 25 MG PO TABS: ORAL_TABLET | ORAL | 6 refills | Status: DC

## 2022-03-13 NOTE — Assessment & Plan Note (Signed)
Following thoracic aortic dilation. ?She is on a beta-blocker however we have stopped all other medications including her lipid management. ? ?My plan will be to allow her time to normalize before we consider reinitiating other medications ?

## 2022-03-13 NOTE — Patient Instructions (Signed)
Medication Instructions:  ?Continue only taking these medication Xarelto and Bystolic ? ?Take Metoprolol tartrate  25 mg  if heartrate is greater than 100  and it is sustain more than 10 minutes then take  Metoprolol ,but if blood pressure (systolic) is less than 191/? Do not take Metoprolol - just sit and  ?relax ? ?*If you need a refill on your cardiac medications before your next appointment, please call your pharmacy* ? ? ?Lab Work:in Oct 2023 fasting ? ?CMP ?TSH ?LIPID ?CBC ?If you have labs (blood work) drawn today and your tests are completely normal, you will receive your results only by: ?MyChart Message (if you have MyChart) OR ?A paper copy in the mail ?If you have any lab test that is abnormal or we need to change your treatment, we will call you to review the results. ? ? ?Testing/Procedures: ?Will be schedule I Nov 2023 for  MR angio of chest with and without Contrast- follow aorta dilatation   ? ? ?Follow-Up: ?At [facility], you and your health needs are our priority.  As part of our continuing mission to provide you with exceptional heart care, we have created designated Provider Care Teams.  These Care Teams include your primary Cardiologist (physician) and Advanced Practice Providers (APPs -  Physician Assistants and Nurse Practitioners) who all work together to provide you with the care you need, when you need it. ? ?  ? ?Your next appointment:   ?6 month(s) ? ?The format for your next appointment:   ?In Person ? ?Provider:   ?[name], MD  ? ? ?Other Instructions  ?Keep appointment with Afib clinic ? ?

## 2022-03-13 NOTE — Assessment & Plan Note (Addendum)
Is due for follow-up MRA of chest later on this year. ?Continue blood pressure control.  We will need to readdress the concept of lipid management, but for now we will stay off of both Zetia and Nexletol. ?Perhaps PCSK9 inhibitor or even inclisiran will be an option. ?

## 2022-03-13 NOTE — Assessment & Plan Note (Signed)
Looks like you are doing pretty well with Nexletol and ezetimibe, but since she had her episodes of intolerance to medications recently we have stopped everything.  I want to give her several months to totally recover before we consider restarting medications. ? ?Suspect that she may very well need minimal therapy which would probably be either inclisiran or PCSK9 inhibitors. 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 ?

## 2022-03-13 NOTE — Assessment & Plan Note (Signed)
Blood pressure is actually stable for little while on Bystolic.  We stopped amlodipine to allow for even mild permissive hypertension. ?

## 2022-03-13 NOTE — Assessment & Plan Note (Signed)
CHA2DS2-VASc 4 7.  On Xarelto.  Tolerating well.  With her reduced size, she is on 50 mg daily maintenance. ?

## 2022-03-13 NOTE — Assessment & Plan Note (Signed)
Actually the steroid taper that she took for costochondritis symptoms back in March was pretty helpful for her.  Much less symptomatic at this point still has a little bit discomfort from the Linq monitor likely related to scar tissue.  I think, that while most people believe there is in, she probably would prefer to have her when the battery dies. ?

## 2022-03-13 NOTE — Assessment & Plan Note (Signed)
She does not tolerate medications very well.  We basically cleaned house by stopping just medications that could be potentially playing a role with her symptoms. ?Minimize medications for now to allow her to recover.  I do not think that she will likely will tolerate antiarrhythmic agents and therefore I think a rate control option is probably the best choice for A-fib. ?

## 2022-03-13 NOTE — Progress Notes (Signed)
? ? ?Primary Care Provider: [name], MD ?Cardiologist: Marissa Hew, MD ?Electrophysiologist: Marissa Epley, MD ? ?Clinic Note: ?Chief Complaint  ?Patient presents with  ? Follow-up  ?  Feeling much better off of medications  ? Atrial Fibrillation  ?  Actually is in A-fib today and does not necessarily notice it.  ? ? ?=================================== ? ?ASSESSMENT/PLAN  ? ?Problem List Items Addressed This Visit   ? ?  ? Cardiology Problems  ? Persistent atrial fibrillation ([place]): CHA2DS2-VASc Score = 7  - Primary (Chronic)  ?  Persistent A-fib.  Has been relatively intolerant of despite IV amiodarone to try to continue rhythm control.  Initially did okay on flecainide but then did worse. ?Discussed with Dr. [name], he did not feel that she was a good candidate for ablation.  Only remaining AAD options are Tikosyn and sotalol.  She is due to visit A-fib clinic on June 7.  They can discuss that possibility, for now my recommendation will be to plan on rate control since she is relatively asymptomatic today with A-fib at a rate of 83 bpm. ? ?Continue with rate control using Bystolic. ?We will add PRN short acting metoprolol to take for tachycardic A-fib spells where her heart rate is sustained greater than 110 bpm. ?She thankfully is tolerating Xarelto 50 mg without any issues. ? ? ?  ?  ? Relevant Medications  ? metoprolol tartrate (LOPRESSOR) 25 MG tablet  ? Other Relevant Orders  ? EKG 12-Lead (Completed)  ? Lipid panel  ? Comprehensive metabolic panel  ? CBC  ? TSH  ? MR ANGIO CHEST W WO CONTRAST  ? 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  Is due for follow-up MRA of chest later on this year. ?Continue blood pressure control.  We will need to readdress the concept of lipid management, but for now we will stay off of both Zetia and Nexletol. ?Perhaps PCSK9 inhibitor or even inclisiran will be an option. ? ?  ?  ? Relevant Medications  ? metoprolol tartrate (LOPRESSOR) 25 MG  tablet  ? Other Relevant Orders  ? Lipid panel  ? Comprehensive metabolic panel  ? CBC  ? TSH  ? MR ANGIO CHEST W WO CONTRAST  ? Labile hypertension (Chronic)  ?  Blood pressure is actually stable for little while on Bystolic.  We stopped amlodipine to allow for even mild permissive hypertension. ? ?  ?  ? Relevant Medications  ? metoprolol tartrate (LOPRESSOR) 25 MG tablet  ? Other Relevant Orders  ? Lipid panel  ? Comprehensive metabolic panel  ? CBC  ? TSH  ? MR ANGIO CHEST W WO CONTRAST  ? Mild aortic stenosis (Chronic)  ?  He has been relatively stable over the last several years.  Plan is to follow-up in 2024 with repeat echo. ? ?  ?  ? Relevant Medications  ? metoprolol tartrate (LOPRESSOR) 25 MG tablet  ? Other Relevant Orders  ? Lipid panel  ? Comprehensive metabolic panel  ? CBC  ? TSH  ? MR ANGIO CHEST W WO CONTRAST  ? Hyperlipidemia with target LDL less than 100 (Chronic)  ?  Looks like you are doing pretty well with Nexletol and ezetimibe, but since she had her episodes of intolerance to medications recently we have stopped everything.  I want to give her several months to totally recover before we consider restarting medications. ? ?Suspect that she may very well need minimal therapy which would probably be  either inclisiran or PCSK9 inhibitors. 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 ? ?  ?  ? Relevant Medications  ? metoprolol tartrate (LOPRESSOR) 25 MG tablet  ? Other Relevant Orders  ? Lipid panel  ? Comprehensive metabolic panel  ? CBC  ? TSH  ? MR ANGIO CHEST W WO CONTRAST  ? Aortic atherosclerosis (HCC) (Chronic)  ?  Following thoracic aortic dilation. ?She is on a beta-blocker however we have stopped all other medications including her lipid management. ? ?My plan will be to allow her time to normalize before we consider reinitiating other medications ? ?  ?  ? Relevant Medications  ?  metoprolol tartrate (LOPRESSOR) 25 MG tablet  ?  ? Other  ? Costochondritis  ?  Actually the steroid taper that she took for costochondritis symptoms back in March was pretty helpful for her.  Much less symptomatic at this point still has a little bit discomfort from the Linq monitor likely related to scar tissue.  I think, that while most people believe there is in, she probably would prefer to have her when the battery dies. ? ?  ?  ? Adverse effect of drug  ?  She does not tolerate medications very well.  We basically cleaned house by stopping just medications that could be potentially playing a role with her symptoms. ?Minimize medications for now to allow her to recover.  I do not think that she will likely will tolerate antiarrhythmic agents and therefore I think a rate control option is probably the best choice for A-fib. ? ?  ?  ? Secondary hypercoagulable state ([place]) (Chronic)  ?  CHA2DS2-VASc 4 7.  On Xarelto.  Tolerating well.  With her reduced size, she is on 50 mg daily maintenance. ? ?  ?  ? Relevant Orders  ? Lipid panel  ? Comprehensive metabolic panel  ? CBC  ? TSH  ? MR ANGIO CHEST W WO CONTRAST  ? ? ?=================================== ? ?HPI:   ? ?Marissa [name] is a 76 y.o. 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 ? ?Marissa [name] was last seen on February 18, 2022 as a work in visit to discuss results of the amiodarone attempt.  She actually stopped taking after 2 days citing multiple different side effects of ataxia, stuttering, nausea, dyspnea, weakness and chills etc.  She is felt horrible.  After taking it for 2 days, she stopped taking it.  She even noted chest pain.  The plan at that time was to discontinue amiodarone altogether.  Wanted to wait  out the side effects before deciding other treatment options.  Discussed potential A-fib ablation with Dr. [name] who felt that she was not a good candidate.  Could consider sotalol or Tikosyn. ? ?She called in on ? ?May 90 complaining of multiple different symptoms.  Created a MyChart encounter note: I noted that she had been switched to Nexletol and ezetimibe for her lipids and was also on Bystolic and amlodipine for blood pressure.  She was having significant symptoms despite having stopped amiodarone.  Still having weakness and shakiness etc.  Walking with a walker. ?Plan was to  discontinue both Nexletol and Zetia.  Continue Bystolic discontinue amlodipine.  I messaged Dr. [name] who felt that she was not a good option for ablation. ?Suggested that she follow back up again in 7 to 10 days to reassess her symptoms. ? ?Recent Hospitalizations: None ? ?Reviewed  CV studies:   ? ?The following studies were reviewed today: (if available, images/films reviewed: From Epic Chart or Care Everywhere) ?None: ? ?Interval History:  ? ?Marissa [name] returns here today finally feeling better.  She still has a little bit of balance issues and a little bit of chest discomfort here and there.  But no more falls.  Ataxia is notably improved.  Her energy level is better.  She is in A-fib today but has no sensation of irregular heartbeats or skipping beat.  She denies any dyspnea.  No PND, orthopnea or edema.  No chest pain or pressure with exertion.  She has random chest pain but not any different than the pain that she has had off-and-on.  All in all, she feels a whole lot better being off of all the medications.  Difficult to tell which medication it was, but overall doing much better. ? ?CV Review of Symptoms (Summary) ?Cardiovascular ROS: positive for - chest pain, palpitations, and still has some mild balance issues but overall notably improved. ?negative for - dyspnea on exertion, edema, orthopnea, paroxysmal nocturnal  dyspnea, rapid heart rate, shortness of breath, or syncope, near syncope or TIA/amaurosis fugax, claudication.  No further falls. ? ?REVIEWED OF SYSTEMS  ? ?Review of Systems  ?Constitutional:  Positive fo

## 2022-03-13 NOTE — Progress Notes (Signed)
[facility] Summary Report / Loop Recorder 

## 2022-03-13 NOTE — Assessment & Plan Note (Signed)
Persistent A-fib.  Has been relatively intolerant of despite IV amiodarone to try to continue rhythm control.  Initially did okay on flecainide but then did worse. ?Discussed with Dr. [name], he did not feel that she was a good candidate for ablation.  Only remaining AAD options are Tikosyn and sotalol.  She is due to visit A-fib clinic on June 7.  They can discuss that possibility, for now my recommendation will be to plan on rate control since she is relatively asymptomatic today with A-fib at a rate of 83 bpm. ? ?Continue with rate control using Bystolic. ?We will add PRN short acting metoprolol to take for tachycardic A-fib spells where her heart rate is sustained greater than 110 bpm. ?She thankfully is tolerating Xarelto 50 mg without any issues. ? ?

## 2022-03-13 NOTE — Assessment & Plan Note (Signed)
He has been relatively stable over the last several years.  Plan is to follow-up in 2024 with repeat echo. ?

## 2022-03-19 ENCOUNTER — Encounter: Payer: Medicare Other | Admitting: Cardiology

## 2022-03-28 ENCOUNTER — Other Ambulatory Visit: Payer: Self-pay | Admitting: Internal Medicine

## 2022-03-28 ENCOUNTER — Encounter: Payer: Self-pay | Admitting: Internal Medicine

## 2022-03-28 MED ORDER — ESZOPICLONE 3 MG PO TABS: 3.0000 mg | ORAL_TABLET | Freq: Every day | ORAL | 1 refills | Status: DC

## 2022-04-01 ENCOUNTER — Ambulatory Visit (INDEPENDENT_AMBULATORY_CARE_PROVIDER_SITE_OTHER): Payer: Medicare Other

## 2022-04-01 DIAGNOSIS — I4819 Other persistent atrial fibrillation: Secondary | ICD-10-CM | POA: Diagnosis not present

## 2022-04-02 LAB — CUP PACEART REMOTE DEVICE CHECK
Date Time Interrogation Session: 20230606114753
Implantable Pulse Generator Implant Date: 20221014

## 2022-04-03 ENCOUNTER — Ambulatory Visit (HOSPITAL_COMMUNITY)
Admission: RE | Admit: 2022-04-03 | Discharge: 2022-04-03 | Disposition: A | Discharge status: Home / Self Care | Payer: Medicare Other | Source: Ambulatory Visit | Attending: Physician Assistant | Admitting: Physician Assistant

## 2022-04-03 ENCOUNTER — Encounter (HOSPITAL_COMMUNITY): Payer: Self-pay

## 2022-04-03 ENCOUNTER — Telehealth: Payer: Self-pay | Admitting: Pharmacist

## 2022-04-03 VITALS — BP 99/75 | HR 76 | Wt 132.0 lb

## 2022-04-03 DIAGNOSIS — I4819 Other persistent atrial fibrillation: Secondary | ICD-10-CM | POA: Diagnosis not present

## 2022-04-03 DIAGNOSIS — D6869 Other thrombophilia: Secondary | ICD-10-CM

## 2022-04-03 NOTE — Telephone Encounter (Signed)
Medication list reviewed in anticipation of upcoming Tikosyn initiation. Patient is taking fluoxetine which is QTc prolonging. Will need to keep a close eye on EKG.  Patient is anticoagulated on Xarelto 57m daily. Her CrCl is exactly 50 and is borderline for dose change. Will need to keep a close eye on her renal function to ensure that she stays therapeutically anticoagulated. Please ensure that patient has not missed any anticoagulation doses in the 3 weeks prior to Tikosyn initiation.   Patient will need to be counseled to avoid use of Benadryl while on Tikosyn and in the 2-3 days prior to Tikosyn initiation.

## 2022-04-03 NOTE — Progress Notes (Signed)
Electrophysiology TeleHealth Note   Audio/video telehealth visit is felt to be most appropriate for this patient at this time.  See consent below from today for patient consent regarding telehealth for the [facility].    Date:  04/03/2022   ID:  Marissa [name], DOB 03-20-1946, MRN [id number]  Location: home  Provider location: [address] [place], [place] [address] Evaluation Performed: Follow up  PCP:  Marissa Koch, MD  Primary Cardiologist:  Dr Marissa [name] Primary Electrophysiologist: Dr Marissa [name]    History of Present Illness: Marissa [name] is a 76 y.o. female with a history of HTN, aortic stenosis, HLD, prior CVA, atrial fibrillation who presents via audio/video conferencing for a telehealth follow up visit today. The patient was initially diagnosed with atrial fibrillation 08/28/21 on ILR which was placed for cryptogenic stroke on 08/09/21. There were two afib episodes detected with the longest being 3 hours. Patient has a CHADS2VASC score of 6. Patient was unaware of her arrhythmia. She did have an increasing afib burden noted on her ILR and she was started on amiodarone but stopped after 2 days with intolerable side effects.   On follow up today, patient has been in persistent afib for several weeks now. She does have symptoms of fatigue and dizziness. Patient also has nausea and vomiting which started yesterday. No fever but she does have intermittent chills and hot spells. No sick contacts.   Today, she denies symptoms of palpitations, chest pain, shortness of breath, orthopnea, PND, lower extremity edema, claudication,  presyncope, syncope, bleeding, or neurologic sequela. The patient is tolerating medications without difficulties and is otherwise without complaint today.    Atrial Fibrillation Risk Factors:  she does not have symptoms or diagnosis of sleep apnea. she does not have a history of rheumatic fever.   she has a BMI of Body mass index is  22.66 kg/m.[name] Kitchen Filed Weights   04/03/22 1352  Weight: 59.9 kg    Past Medical History:  Diagnosis Date   Arthritis    Asthma    Bicuspid aortic valve 05/2017   Likely functional bicuspid aortic valve with sclerosis and no stenosis.   Bursitis    Cataract    mild   Dysthymia    Hemorrhoids    Hx of ulcerative colitis    per dr Arnoldo Morale as per pt.   Hyperlipidemia    Labile hypertension    managed - labile.   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 Roxan Hockey)   Thyroid disease    Past Surgical History:  Procedure Laterality Date   ABDOMINAL HYSTERECTOMY     BREAST EXCISIONAL BIOPSY Left    BUNIONECTOMY     Cardiac Event Monitor  07/2017   Overall relatively normal.  Normal sinus rhythm with rare bradycardia and tachycardia.  Heart rate ranged from 55-110 bpm.  Occasional PACs and PVCs, every single 1 was felt.  No arrhythmias other than one short 4 beat run of PACs.   COLONOSCOPY  02/04/2005   all normal    FOOT SURGERY     3 pins in toes    HAND SURGERY  left thumb joint resection   LOOP RECORDER INSERTION N/A 08/09/2021   Procedure: LOOP RECORDER INSERTION;  Surgeon: [name], MD;  Location: [facility];  Service: Cardiovascular;  Laterality: N/A;   nasal revision     NM MYOVIEW LTD  07/2017   LOW RISK study.  No ischemia or infarction.  EF greater than 65%.    SINUS IRRIGATION     TONSILLECTOMY AND ADENOIDECTOMY     TRANSTHORACIC ECHOCARDIOGRAM  06/'1, 8/'19   a) Moderate LVH. Normal EF 60-65%. Normal diastolic parameters. --> Difficult to fully visualize the aortic valve. Cannot exclude bicuspid valve. Mild aortic stenosis noted. No PFO. Mildly dilated left atrium. Trivial MR. No comment  on mitral valve prolapse. Moderately dilated ascending aorta.;; b) F/u Echo To evaluate the aortic valve. -- Bicuspid AoV - mildly thickened / calcified. - No stenosis   TRANSTHORACIC ECHOCARDIOGRAM  08/08/2021   EF 60 to 65%.  Normal diastolic parameters?.   ?  Functional bicuspid aortic valve with R&L cusp sommisure.  Very sclerotic.  Stable gradients ~mean gradient 10 mmHg. Very Mild stenosis. Aorta dilated to 42 mm.  Stable.  RAP 3 mmHg   TRANSTHORACIC ECHOCARDIOGRAM  08/15/2020   EF 60 to 65%.  No R WMA.  GRII DD.  Mild Aortic Stenosis (mean gradient 10 mmHg).  Moderate to severe aortic root dilation (45 mm) normal IVC.  (Stable compared to February 2020)      Current Outpatient Medications  Medication Sig Dispense Refill   albuterol (VENTOLIN HFA) 108 (90 Base) MCG/ACT inhaler INHALE 2 PUFFS INTO THE LUNGS EVERY 6 HOURS AS NEEDED FOR WHEEZING OR SHORTNESS OF BREATH 6.7 g 2   ALPRAZolam (XANAX) 0.5 MG tablet TAKE 1 TABLET(0.5 MG) BY MOUTH TWICE DAILY AS NEEDED FOR ANXIETY 20 tablet 2   carisoprodol (SOMA) 350 MG tablet TAKE 1 TABLET(350 MG) BY MOUTH THREE TIMES DAILY AS NEEDED FOR MUSCLE SPASMS 60 tablet 5   cloNIDine (CATAPRES) 0.2 MG tablet Take 0.1 mg by mouth 2 (two) times daily as needed (systolic over 016). if blood pressure is great than 010 systolic for more than an hour 20 tablet 6   Eszopiclone 3 MG TABS Take 1 tablet (3 mg total) by mouth at bedtime. Take immediately before bedtime 90 tablet 1   feeding supplement, GLUCERNA SHAKE, (GLUCERNA SHAKE) LIQD Take 237 mLs by mouth daily as needed (if not eating meals).     FLUoxetine (PROZAC) 40 MG capsule Take 1 capsule (40 mg total) by mouth daily. 90 capsule 0   levothyroxine (SYNTHROID) 125 MCG tablet TAKE 1 TABLET(125 MCG) BY MOUTH DAILY 90 tablet 1   LYLLANA 0.05 MG/24HR patch Place 1 patch onto the skin as needed.     metoprolol tartrate (LOPRESSOR) 25 MG tablet Take 25 mg tablet as needed if heart rate is above 100 after waiting  for 10 minutes , if systolic blood pressure is less than 110 do not take metoprolol. 30 tablet 6   montelukast (SINGULAIR) 10 MG tablet TAKE 1 TABLET(10 MG) BY MOUTH AT BEDTIME 90 tablet 3   MYRBETRIQ 50 MG TB24 tablet TAKE 1 TABLET(50 MG) BY MOUTH DAILY 90 tablet 1   nebivolol (BYSTOLIC) 10 MG tablet TAKE 1 TABLET(10 MG) BY MOUTH DAILY AT 8 PM 90 tablet 3   Respiratory Therapy Supplies (FLUTTER) DEVI Use as directed 1 each 0   Rivaroxaban (XARELTO) 15 MG TABS tablet Take 1 tablet (15 mg total) by mouth daily with supper. STOP ELIQUIS  90 tablet 1   triamcinolone cream (KENALOG) 0.1 % Apply 1 application. topically 2 (two) times daily. 100 g 2   No current facility-administered medications for this encounter.    Allergies:   Amiodarone, Fentanyl, Rosuvastatin, Amoxicillin, Hctz [hydrochlorothiazide], Hydralazine, Codeine, Conjugated estrogens, Erythromycin, and Piroxicam   Social History:  The patient  reports that she has never smoked. She has never used smokeless tobacco. She reports current alcohol use of about 7.0 standard drinks per week. She reports that she does not use drugs.   Family History:  The patient's  family history includes Arthritis in her mother; Breast cancer in her maternal grandmother and paternal grandmother; Diabetes in her paternal aunt; Heart disease in her maternal grandfather and maternal uncle; Leukemia in her father.    ROS:  Please see the history of present illness.   All other systems are personally reviewed and negative.   Exam: Well appearing, alert and conversant, regular work of breathing,  good skin color  Recent Labs: 08/07/2021: Magnesium 1.7 01/28/2022: ALT 26; BUN 17; Creatinine, Ser 0.89; Hemoglobin 11.4; Platelets 175.0; Potassium 3.8; Sodium 133; TSH 0.21  personally reviewed    CHA2DS2-VASc Score = 7  The patient's score is based upon: CHF History: 0 HTN History: 1 Diabetes History: 0 Stroke History: 2 Vascular Disease History: 1 Age  Score: 2 Gender Score: 1       ASSESSMENT AND PLAN: 1. Persistent Atrial Fibrillation (ICD10:  I48.19) The patient's CHA2DS2-VASc score is 7, indicating a 11.2% annual risk of stroke.   We discussed rhythm control options today.  Patient wants to pursue dofetilide. Continue Xarelto 15 mg daily PharmD to screen medications  Continue nebivolol 10 mg daily  2. Secondary Hypercoagulable State (ICD10:  D68.69) The patient is at significant risk for stroke/thromboembolism based upon her CHA2DS2-VASc Score of 7.  Continue Rivaroxaban (Xarelto).   3. HTN No changes today.  4. Nausea/vomiting Improved today. Patient plans to get in touch with PCP if symptoms don't continue to improve.    Follow-up in the AF clinic for dofetilide loading.    Patient Risk:  after full review of this patients clinical status, I feel that they are at moderate risk at this time.   Today, I have spent 19 minutes with the patient with telehealth technology discussing the above.    Gwenlyn Perking PA-C 04/03/2022 2:28 PM  [facility] [address] [place], [place] [address] [phone]   I hereby voluntarily request, consent and authorize the [facility] and its employed or contracted physicians, physician assistants, nurse practitioners or other licensed health care professionals (the Practitioner), to provide me with telemedicine health care services (the "Services") as deemed necessary by the treating Practitioner. I acknowledge and consent to receive the Services by the Practitioner via telemedicine. I understand that the telemedicine visit will involve communicating with the Practitioner through live audiovisual communication technology and the disclosure of certain medical information by electronic transmission. I acknowledge that I have been given the opportunity to request an in-person assessment or other available alternative prior to the telemedicine  visit and am voluntarily participating in the telemedicine visit.   I understand that I have the right to withhold or withdraw my consent to the use of telemedicine in the course of my care at any time, without affecting my right to future care or treatment, and that the Practitioner or I may terminate the telemedicine visit at any time. I understand that I have the right  to inspect all information obtained and/or recorded in the course of the telemedicine visit and may receive copies of available information for a reasonable fee.  I understand that some of the potential risks of receiving the Services via telemedicine include:   Delay or interruption in medical evaluation due to technological equipment failure or disruption;  Information transmitted may not be sufficient (e.g. poor resolution of images) to allow for appropriate medical decision making by the Practitioner; and/or  In rare instances, security protocols could fail, causing a breach of personal health information.   Furthermore, I acknowledge that it is my responsibility to provide information about my medical history, conditions and care that is complete and accurate to the best of my ability. I acknowledge that Practitioner's advice, recommendations, and/or decision may be based on factors not within their control, such as incomplete or inaccurate data provided by me or distortions of diagnostic images or specimens that may result from electronic transmissions. I understand that the practice of medicine is not an exact science and that Practitioner makes no warranties or guarantees regarding treatment outcomes. I acknowledge that I will receive a copy of this consent concurrently upon execution via email to the email address I last provided but may also request a printed copy by calling the office of the [facility].  I understand that my insurance will be billed for this visit.   I have read or had this consent read to me.   I understand the contents of this consent, which adequately explains the benefits and risks of the Services being provided via telemedicine.  I have been provided ample opportunity to ask questions regarding this consent and the Services and have had my questions answered to my satisfaction.  I give my informed consent for the services to be provided through the use of telemedicine in my medical care  By participating in this telemedicine visit I agree to the above.

## 2022-04-12 ENCOUNTER — Telehealth: Payer: Medicare Other

## 2022-04-18 ENCOUNTER — Other Ambulatory Visit: Payer: Self-pay | Admitting: Internal Medicine

## 2022-04-18 NOTE — Progress Notes (Signed)
[facility] Summary Report / Loop Recorder 

## 2022-04-23 ENCOUNTER — Encounter (HOSPITAL_COMMUNITY): Payer: Self-pay | Admitting: Physician Assistant

## 2022-04-23 ENCOUNTER — Other Ambulatory Visit: Payer: Self-pay

## 2022-04-23 ENCOUNTER — Inpatient Hospital Stay (HOSPITAL_COMMUNITY)
Admission: RE | Admit: 2022-04-23 | Discharge: 2022-05-02 | DRG: 308 | Disposition: A | Discharge status: Skilled Nursing Facility | Payer: Medicare Other | Source: Ambulatory Visit | Attending: Cardiology | Admitting: Cardiology

## 2022-04-23 ENCOUNTER — Ambulatory Visit (HOSPITAL_COMMUNITY)
Admission: RE | Admit: 2022-04-23 | Discharge: 2022-04-23 | Disposition: A | Discharge status: Home / Self Care | Payer: Medicare Other | Source: Ambulatory Visit | Attending: Physician Assistant | Admitting: Physician Assistant

## 2022-04-23 VITALS — BP 140/90 | HR 113 | Ht 64.0 in | Wt 121.4 lb

## 2022-04-23 DIAGNOSIS — I469 Cardiac arrest, cause unspecified: Secondary | ICD-10-CM | POA: Diagnosis not present

## 2022-04-23 DIAGNOSIS — I4901 Ventricular fibrillation: Secondary | ICD-10-CM | POA: Diagnosis not present

## 2022-04-23 DIAGNOSIS — Z8261 Family history of arthritis: Secondary | ICD-10-CM

## 2022-04-23 DIAGNOSIS — D6869 Other thrombophilia: Secondary | ICD-10-CM

## 2022-04-23 DIAGNOSIS — Z90710 Acquired absence of both cervix and uterus: Secondary | ICD-10-CM

## 2022-04-23 DIAGNOSIS — Z885 Allergy status to narcotic agent status: Secondary | ICD-10-CM | POA: Diagnosis not present

## 2022-04-23 DIAGNOSIS — Z888 Allergy status to other drugs, medicaments and biological substances status: Secondary | ICD-10-CM | POA: Diagnosis not present

## 2022-04-23 DIAGNOSIS — I4581 Long QT syndrome: Secondary | ICD-10-CM | POA: Diagnosis not present

## 2022-04-23 DIAGNOSIS — Z881 Allergy status to other antibiotic agents status: Secondary | ICD-10-CM | POA: Diagnosis not present

## 2022-04-23 DIAGNOSIS — Y92239 Unspecified place in hospital as the place of occurrence of the external cause: Secondary | ICD-10-CM | POA: Diagnosis not present

## 2022-04-23 DIAGNOSIS — F32A Depression, unspecified: Secondary | ICD-10-CM | POA: Diagnosis not present

## 2022-04-23 DIAGNOSIS — F05 Delirium due to known physiological condition: Secondary | ICD-10-CM | POA: Diagnosis present

## 2022-04-23 DIAGNOSIS — J45909 Unspecified asthma, uncomplicated: Secondary | ICD-10-CM | POA: Diagnosis present

## 2022-04-23 DIAGNOSIS — T462X5A Adverse effect of other antidysrhythmic drugs, initial encounter: Secondary | ICD-10-CM | POA: Diagnosis not present

## 2022-04-23 DIAGNOSIS — Z806 Family history of leukemia: Secondary | ICD-10-CM

## 2022-04-23 DIAGNOSIS — E871 Hypo-osmolality and hyponatremia: Secondary | ICD-10-CM | POA: Diagnosis not present

## 2022-04-23 DIAGNOSIS — I69398 Other sequelae of cerebral infarction: Secondary | ICD-10-CM | POA: Diagnosis not present

## 2022-04-23 DIAGNOSIS — D649 Anemia, unspecified: Secondary | ICD-10-CM | POA: Diagnosis not present

## 2022-04-23 DIAGNOSIS — G9332 Myalgic encephalomyelitis/chronic fatigue syndrome: Secondary | ICD-10-CM | POA: Diagnosis present

## 2022-04-23 DIAGNOSIS — E860 Dehydration: Secondary | ICD-10-CM | POA: Diagnosis present

## 2022-04-23 DIAGNOSIS — F339 Major depressive disorder, recurrent, unspecified: Secondary | ICD-10-CM | POA: Diagnosis not present

## 2022-04-23 DIAGNOSIS — T50905A Adverse effect of unspecified drugs, medicaments and biological substances, initial encounter: Secondary | ICD-10-CM

## 2022-04-23 DIAGNOSIS — E039 Hypothyroidism, unspecified: Secondary | ICD-10-CM | POA: Diagnosis present

## 2022-04-23 DIAGNOSIS — I468 Cardiac arrest due to other underlying condition: Secondary | ICD-10-CM | POA: Diagnosis not present

## 2022-04-23 DIAGNOSIS — I4819 Other persistent atrial fibrillation: Principal | ICD-10-CM

## 2022-04-23 DIAGNOSIS — I10 Essential (primary) hypertension: Secondary | ICD-10-CM | POA: Diagnosis present

## 2022-04-23 DIAGNOSIS — E785 Hyperlipidemia, unspecified: Secondary | ICD-10-CM | POA: Diagnosis present

## 2022-04-23 DIAGNOSIS — F419 Anxiety disorder, unspecified: Secondary | ICD-10-CM | POA: Diagnosis present

## 2022-04-23 DIAGNOSIS — Z8674 Personal history of sudden cardiac arrest: Secondary | ICD-10-CM | POA: Diagnosis present

## 2022-04-23 DIAGNOSIS — R9431 Abnormal electrocardiogram [ECG] [EKG]: Secondary | ICD-10-CM | POA: Diagnosis present

## 2022-04-23 DIAGNOSIS — R413 Other amnesia: Secondary | ICD-10-CM | POA: Diagnosis not present

## 2022-04-23 DIAGNOSIS — K5900 Constipation, unspecified: Secondary | ICD-10-CM | POA: Diagnosis not present

## 2022-04-23 DIAGNOSIS — R0989 Other specified symptoms and signs involving the circulatory and respiratory systems: Secondary | ICD-10-CM | POA: Diagnosis present

## 2022-04-23 DIAGNOSIS — R339 Retention of urine, unspecified: Secondary | ICD-10-CM | POA: Diagnosis not present

## 2022-04-23 DIAGNOSIS — Z8249 Family history of ischemic heart disease and other diseases of the circulatory system: Secondary | ICD-10-CM

## 2022-04-23 DIAGNOSIS — R338 Other retention of urine: Secondary | ICD-10-CM | POA: Diagnosis not present

## 2022-04-23 DIAGNOSIS — R4182 Altered mental status, unspecified: Secondary | ICD-10-CM | POA: Diagnosis not present

## 2022-04-23 DIAGNOSIS — F0390 Unspecified dementia without behavioral disturbance: Secondary | ICD-10-CM | POA: Diagnosis present

## 2022-04-23 DIAGNOSIS — I493 Ventricular premature depolarization: Secondary | ICD-10-CM | POA: Diagnosis not present

## 2022-04-23 DIAGNOSIS — Z8673 Personal history of transient ischemic attack (TIA), and cerebral infarction without residual deficits: Secondary | ICD-10-CM | POA: Diagnosis not present

## 2022-04-23 DIAGNOSIS — R270 Ataxia, unspecified: Secondary | ICD-10-CM | POA: Diagnosis not present

## 2022-04-23 DIAGNOSIS — F03911 Unspecified dementia, unspecified severity, with agitation: Secondary | ICD-10-CM | POA: Diagnosis present

## 2022-04-23 DIAGNOSIS — E46 Unspecified protein-calorie malnutrition: Secondary | ICD-10-CM | POA: Diagnosis not present

## 2022-04-23 DIAGNOSIS — I4721 Torsades de pointes: Secondary | ICD-10-CM | POA: Diagnosis not present

## 2022-04-23 DIAGNOSIS — I350 Nonrheumatic aortic (valve) stenosis: Secondary | ICD-10-CM | POA: Diagnosis not present

## 2022-04-23 DIAGNOSIS — R000 Tachycardia, unspecified: Secondary | ICD-10-CM | POA: Diagnosis not present

## 2022-04-23 DIAGNOSIS — Z803 Family history of malignant neoplasm of breast: Secondary | ICD-10-CM

## 2022-04-23 DIAGNOSIS — I712 Thoracic aortic aneurysm, without rupture, unspecified: Secondary | ICD-10-CM | POA: Diagnosis not present

## 2022-04-23 DIAGNOSIS — R001 Bradycardia, unspecified: Secondary | ICD-10-CM | POA: Diagnosis present

## 2022-04-23 DIAGNOSIS — R008 Other abnormalities of heart beat: Secondary | ICD-10-CM | POA: Diagnosis not present

## 2022-04-23 DIAGNOSIS — Z466 Encounter for fitting and adjustment of urinary device: Secondary | ICD-10-CM | POA: Diagnosis not present

## 2022-04-23 DIAGNOSIS — Z833 Family history of diabetes mellitus: Secondary | ICD-10-CM

## 2022-04-23 DIAGNOSIS — Z7901 Long term (current) use of anticoagulants: Secondary | ICD-10-CM

## 2022-04-23 DIAGNOSIS — Z79899 Other long term (current) drug therapy: Secondary | ICD-10-CM

## 2022-04-23 DIAGNOSIS — E038 Other specified hypothyroidism: Secondary | ICD-10-CM | POA: Diagnosis not present

## 2022-04-23 DIAGNOSIS — M6281 Muscle weakness (generalized): Secondary | ICD-10-CM | POA: Diagnosis not present

## 2022-04-23 LAB — BASIC METABOLIC PANEL WITH GFR
Anion gap: 18 — ABNORMAL HIGH (ref 5–15)
BUN: 15 mg/dL (ref 8–23)
CO2: 19 mmol/L — ABNORMAL LOW (ref 22–32)
Calcium: 9.3 mg/dL (ref 8.9–10.3)
Chloride: 99 mmol/L (ref 98–111)
Creatinine, Ser: 1.02 mg/dL — ABNORMAL HIGH (ref 0.44–1.00)
GFR, Estimated: 57 mL/min — ABNORMAL LOW
Glucose, Bld: 101 mg/dL — ABNORMAL HIGH (ref 70–99)
Potassium: 3.6 mmol/L (ref 3.5–5.1)
Sodium: 136 mmol/L (ref 135–145)

## 2022-04-23 LAB — MAGNESIUM
Magnesium: 1.3 mg/dL — ABNORMAL LOW (ref 1.7–2.4)
Magnesium: 2.7 mg/dL — ABNORMAL HIGH (ref 1.7–2.4)

## 2022-04-23 MED ORDER — ALBUTEROL SULFATE (2.5 MG/3ML) 0.083% IN NEBU
3.0000 mL | INHALATION_SOLUTION | Freq: Four times a day (QID) | RESPIRATORY_TRACT | Status: DC | PRN
  Filled 2022-04-23: qty 3

## 2022-04-23 MED ORDER — RIVAROXABAN 15 MG PO TABS
15.0000 mg | ORAL_TABLET | Freq: Every day | ORAL | Status: DC
  Administered 2022-04-23 – 2022-05-01 (×9): 15 mg via ORAL
  Filled 2022-04-23 (×10): qty 1

## 2022-04-23 MED ORDER — SODIUM CHLORIDE 0.9% FLUSH
3.0000 mL | INTRAVENOUS | Status: DC | PRN
Start: 2022-04-23 — End: 2022-05-02

## 2022-04-23 MED ORDER — DOFETILIDE 250 MCG PO CAPS
250.0000 ug | ORAL_CAPSULE | Freq: Two times a day (BID) | ORAL | Status: DC
  Filled 2022-04-23: qty 1

## 2022-04-23 MED ORDER — CLONIDINE HCL 0.2 MG PO TABS
0.2000 mg | ORAL_TABLET | Freq: Two times a day (BID) | ORAL | Status: DC | PRN
  Administered 2022-04-24 – 2022-05-02 (×4): 0.2 mg via ORAL
  Filled 2022-04-23: qty 1
  Filled 2022-04-23: qty 2
  Filled 2022-04-23 (×3): qty 1

## 2022-04-23 MED ORDER — LEVOTHYROXINE SODIUM 25 MCG PO TABS
125.0000 ug | ORAL_TABLET | Freq: Every day | ORAL | Status: DC
  Administered 2022-04-24 – 2022-05-02 (×9): 125 ug via ORAL
  Filled 2022-04-23 (×9): qty 1

## 2022-04-23 MED ORDER — DOFETILIDE 250 MCG PO CAPS
250.0000 ug | ORAL_CAPSULE | Freq: Once | ORAL | Status: DC
Start: 2022-04-24 — End: 2022-04-24

## 2022-04-23 MED ORDER — SODIUM CHLORIDE 0.9 % IV SOLN
250.0000 mL | INTRAVENOUS | Status: DC | PRN
  Administered 2022-04-24: 250 mL via INTRAVENOUS

## 2022-04-23 MED ORDER — CARISOPRODOL 350 MG PO TABS
350.0000 mg | ORAL_TABLET | Freq: Three times a day (TID) | ORAL | Status: DC | PRN
  Administered 2022-04-25 – 2022-04-29 (×3): 350 mg via ORAL
  Filled 2022-04-23 (×4): qty 1

## 2022-04-23 MED ORDER — METOPROLOL TARTRATE 25 MG PO TABS: 25.0000 mg | ORAL_TABLET | Freq: Two times a day (BID) | ORAL | Status: DC | PRN

## 2022-04-23 MED ORDER — MIRABEGRON ER 50 MG PO TB24
50.0000 mg | ORAL_TABLET | Freq: Every day | ORAL | Status: DC
  Administered 2022-04-24 – 2022-04-27 (×4): 50 mg via ORAL
  Filled 2022-04-23 (×4): qty 1

## 2022-04-23 MED ORDER — DOFETILIDE 250 MCG PO CAPS
250.0000 ug | ORAL_CAPSULE | Freq: Once | ORAL | Status: AC
  Administered 2022-04-23: 250 ug via ORAL

## 2022-04-23 MED ORDER — DOFETILIDE 250 MCG PO CAPS: 250.0000 ug | ORAL_CAPSULE | Freq: Two times a day (BID) | ORAL | Status: DC

## 2022-04-23 MED ORDER — POTASSIUM CHLORIDE CRYS ER 20 MEQ PO TBCR
60.0000 meq | EXTENDED_RELEASE_TABLET | Freq: Once | ORAL | Status: AC
  Administered 2022-04-23: 60 meq via ORAL
  Filled 2022-04-23: qty 3

## 2022-04-23 MED ORDER — FLUOXETINE HCL 20 MG PO CAPS
40.0000 mg | ORAL_CAPSULE | Freq: Every day | ORAL | Status: DC
  Administered 2022-04-24 – 2022-04-29 (×6): 40 mg via ORAL
  Filled 2022-04-23 (×7): qty 2

## 2022-04-23 MED ORDER — SODIUM CHLORIDE 0.9% FLUSH
3.0000 mL | Freq: Two times a day (BID) | INTRAVENOUS | Status: DC
  Administered 2022-04-23 – 2022-05-02 (×17): 3 mL via INTRAVENOUS

## 2022-04-23 MED ORDER — ACETAMINOPHEN 325 MG PO TABS
650.0000 mg | ORAL_TABLET | Freq: Four times a day (QID) | ORAL | Status: DC | PRN
  Administered 2022-04-23 – 2022-04-27 (×3): 650 mg via ORAL
  Filled 2022-04-23 (×5): qty 2

## 2022-04-23 MED ORDER — NEBIVOLOL HCL 10 MG PO TABS
10.0000 mg | ORAL_TABLET | Freq: Every day | ORAL | Status: DC
  Administered 2022-04-24 – 2022-05-02 (×9): 10 mg via ORAL
  Filled 2022-04-23 (×9): qty 1

## 2022-04-23 MED ORDER — MAGNESIUM SULFATE 4 GM/100ML IV SOLN
4.0000 g | Freq: Once | INTRAVENOUS | Status: AC
Start: 2022-04-23 — End: 2022-04-23
  Administered 2022-04-23: 4 g via INTRAVENOUS
  Filled 2022-04-23: qty 100

## 2022-04-23 MED ORDER — ZOLPIDEM TARTRATE 5 MG PO TABS
5.0000 mg | ORAL_TABLET | Freq: Every evening | ORAL | Status: DC | PRN
  Administered 2022-04-23: 5 mg via ORAL
  Filled 2022-04-23 (×2): qty 1

## 2022-04-23 MED ORDER — POTASSIUM CHLORIDE CRYS ER 20 MEQ PO TBCR: 40.0000 meq | EXTENDED_RELEASE_TABLET | Freq: Once | ORAL | Status: DC

## 2022-04-23 MED ORDER — ALPRAZOLAM 0.5 MG PO TABS
0.5000 mg | ORAL_TABLET | Freq: Two times a day (BID) | ORAL | Status: DC | PRN
  Administered 2022-04-23: 0.5 mg via ORAL
  Filled 2022-04-23: qty 1

## 2022-04-23 MED ORDER — MONTELUKAST SODIUM 10 MG PO TABS
10.0000 mg | ORAL_TABLET | Freq: Every day | ORAL | Status: DC
  Administered 2022-04-23 – 2022-05-01 (×8): 10 mg via ORAL
  Filled 2022-04-23 (×8): qty 1

## 2022-04-23 NOTE — Progress Notes (Signed)
PCP:  Myrlene Broker, MD  Primary Cardiologist:  Dr Herbie Baltimore Primary Electrophysiologist: Dr Lalla Brothers    History of Present Illness: Marissa [name] is a 76 y.o. female with a history of HTN, aortic stenosis, HLD, prior CVA, atrial fibrillation who presents via audio/video conferencing for a telehealth follow up visit today. The patient was initially diagnosed with atrial fibrillation 08/28/21 on ILR which was placed for cryptogenic stroke on 08/09/21. There were two afib episodes detected with the longest being 3 hours. Patient has a CHADS2VASC score of 6. Patient was unaware of her arrhythmia. She did have an increasing afib burden noted on her ILR and she was started on amiodarone but stopped after 2 days with intolerable side effects. Patient has been in persistent afib for several weeks. She does have symptoms of fatigue and dizziness.   On follow up today, patient presents for dofetilide admission. She continues to have significant weakness and fatigue in afib. She denies any missed doses of anticoagulation in the past 3 weeks. She does have several complaints about itchy rashes on her arms and hands. She attributes this to her loop recorder.   Today, she denies symptoms of palpitations, chest pain, shortness of breath, orthopnea, PND, lower extremity edema, claudication,  presyncope, syncope, bleeding, or neurologic sequela. The patient is tolerating medications without difficulties and is otherwise without complaint today.    Atrial Fibrillation Risk Factors:  she does not have symptoms or diagnosis of sleep apnea. she does not have a history of rheumatic fever.   she has a BMI of Body mass index is 20.84 kg/m.[name] Kitchen Filed Weights   04/23/22 1116  Weight: 55.1 kg     Past Medical History:  Diagnosis Date   Arthritis    Asthma    Bicuspid aortic valve 05/2017   Likely functional bicuspid aortic valve with sclerosis and no stenosis.   Bursitis    Cataract    mild    Dysthymia    Hemorrhoids    Hx of ulcerative colitis    per dr Lovell Sheehan as per pt.   Hyperlipidemia    Labile hypertension    managed - labile.   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 Dorris Fetch)   Thyroid disease    Past Surgical History:  Procedure Laterality Date   ABDOMINAL HYSTERECTOMY     BREAST EXCISIONAL BIOPSY Left    BUNIONECTOMY     Cardiac Event Monitor  07/2017   Overall relatively normal.  Normal sinus rhythm with rare bradycardia and tachycardia.  Heart rate ranged from 55-110 bpm.  Occasional PACs and PVCs, every single 1 was felt.  No arrhythmias other than one short 4 beat run of PACs.   COLONOSCOPY  02/04/2005   all normal    FOOT SURGERY     3 pins in toes    HAND SURGERY     left thumb joint resection   LOOP RECORDER INSERTION N/A 08/09/2021   Procedure: LOOP RECORDER INSERTION;  Surgeon: Lanier Prude, MD;  Location: [facility];  Service: Cardiovascular;  Laterality: N/A;   nasal revision     NM MYOVIEW LTD  07/2017   LOW RISK study.  No ischemia or infarction.  EF greater than 65%.    SINUS IRRIGATION     TONSILLECTOMY AND ADENOIDECTOMY     TRANSTHORACIC ECHOCARDIOGRAM  06/'1, 8/'19   a) Moderate LVH. Normal EF 60-65%. Normal diastolic parameters. --> Difficult to fully visualize the aortic valve. Cannot exclude bicuspid valve. Mild aortic stenosis noted. No PFO. Mildly dilated left atrium. Trivial MR. No comment on mitral valve prolapse. Moderately dilated ascending aorta.;; b) F/u Echo To evaluate the aortic valve. -- Bicuspid AoV - mildly thickened / calcified. - No stenosis   TRANSTHORACIC ECHOCARDIOGRAM  08/08/2021   EF 60 to 65%.  Normal diastolic parameters?.   ?  Functional  bicuspid aortic valve with R&L cusp sommisure.  Very sclerotic.  Stable gradients ~mean gradient 10 mmHg. Very Mild stenosis. Aorta dilated to 42 mm.  Stable.  RAP 3 mmHg   TRANSTHORACIC ECHOCARDIOGRAM  08/15/2020   EF 60 to 65%.  No R WMA.  GRII DD.  Mild Aortic Stenosis (mean gradient 10 mmHg).  Moderate to severe aortic root dilation (45 mm) normal IVC.  (Stable compared to February 2020)      Current Outpatient Medications  Medication Sig Dispense Refill   albuterol (VENTOLIN HFA) 108 (90 Base) MCG/ACT inhaler INHALE 2 PUFFS INTO THE LUNGS EVERY 6 HOURS AS NEEDED FOR WHEEZING OR SHORTNESS OF BREATH 6.7 g 2   ALPRAZolam (XANAX) 0.5 MG tablet TAKE 1 TABLET(0.5 MG) BY MOUTH TWICE DAILY AS NEEDED FOR ANXIETY 20 tablet 2   carisoprodol (SOMA) 350 MG tablet TAKE 1 TABLET(350 MG) BY MOUTH THREE TIMES DAILY AS NEEDED FOR MUSCLE SPASMS 60 tablet 5   cloNIDine (CATAPRES) 0.2 MG tablet Take 0.1 mg by mouth 2 (two) times daily as needed (systolic over 160). if blood pressure is great than 160 systolic for more than an hour 20 tablet 6   Eszopiclone 3 MG TABS Take 1 tablet (3 mg total) by mouth at bedtime. Take immediately before bedtime 90 tablet 1   feeding supplement, GLUCERNA SHAKE, (GLUCERNA SHAKE) LIQD Take 237 mLs by mouth daily as needed (if not eating meals).     FLUoxetine (PROZAC) 40 MG capsule TAKE 1 CAPSULE(40 MG) BY MOUTH DAILY 90 capsule 0   levothyroxine (SYNTHROID) 125 MCG tablet TAKE 1 TABLET(125 MCG) BY MOUTH DAILY 90 tablet 1   LYLLANA 0.05 MG/24HR patch Place 1 patch onto the skin as needed.     metoprolol tartrate (LOPRESSOR) 25 MG tablet Take 25 mg tablet as needed if heart rate is above 100 after waiting for 10 minutes , if systolic blood pressure is less than 110 do not take metoprolol. 30 tablet 6   montelukast (SINGULAIR) 10 MG tablet TAKE 1 TABLET(10 MG) BY MOUTH AT BEDTIME 90 tablet 3   MYRBETRIQ 50 MG TB24 tablet TAKE 1 TABLET(50 MG) BY MOUTH DAILY 90 tablet 1   nebivolol  (BYSTOLIC) 10 MG tablet TAKE 1 XBMWUX(32 MG) BY MOUTH DAILY AT 8 PM 90 tablet 3   Respiratory Therapy Supplies (FLUTTER) DEVI Use as directed 1 each 0   Rivaroxaban (XARELTO) 15 MG TABS tablet Take 1 tablet (15 mg total) by mouth daily with supper. STOP ELIQUIS 90 tablet 1   triamcinolone cream (KENALOG) 0.1 % Apply 1 application. topically 2 (two) times daily. 100 g 2   No current facility-administered medications for this encounter.    Allergies:   Amiodarone, Fentanyl, Rosuvastatin, Amoxicillin, Hctz [hydrochlorothiazide], Hydralazine, Codeine, Conjugated estrogens, Erythromycin, and Piroxicam  Social History:  The patient  reports that she has never smoked. She has never used smokeless tobacco. She reports current alcohol use of about 7.0 standard drinks of alcohol per week. She reports that she does not use drugs.   Family History:  The patient's  family history includes Arthritis in her mother; Breast cancer in her maternal grandmother and paternal grandmother; Diabetes in her paternal aunt; Heart disease in her maternal grandfather and maternal uncle; Leukemia in her father.    ROS:  Please see the history of present illness.   All other systems are personally reviewed and negative.   Exam: GEN- The patient is a well appearing elderly female, alert and oriented x 3 today.   HEENT-head normocephalic, atraumatic, sclera clear, conjunctiva pink, hearing intact, trachea midline. Lungs- Clear to ausculation bilaterally, normal work of breathing Heart- irregular rate and rhythm, no murmurs, rubs or gallops  GI- soft, NT, ND, + BS Extremities- no clubbing, cyanosis, or edema MS- no significant deformity or atrophy Skin- no rash, some ecchymosis on forearms  Psych- euthymic mood, full affect Neuro- strength and sensation are intact   Echo 08/08/21  1. Left ventricular ejection fraction, by estimation, is 60 to 65%. The  left ventricle has normal function. The left ventricle has no  regional  wall motion abnormalities. Left ventricular diastolic parameters were  normal.   2. Right ventricular systolic function is normal. The right ventricular  size is normal. There is normal pulmonary artery systolic pressure.   3. The mitral valve is abnormal. Trivial mitral valve regurgitation. No  evidence of mitral stenosis.   4. ? functoinally bicuspid with poor visualization of commisure between right and left cusp Non coronary cusp very sclerotic Gradients stable since TTE done 08/15/20. The aortic valve is tricuspid. There is moderate calcification of the aortic valve.  Aortic valve regurgitation is not visualized. Mild aortic valve stenosis.   5. Aortic root stable since TTE done 08/15/20 . Aortic dilatation noted. There is moderate dilatation of the ascending aorta, measuring 42 mm.   6. The inferior vena cava is normal in size with greater than 50%  respiratory variability, suggesting right atrial pressure of 3 mmHg.    CHA2DS2-VASc Score = 7  The patient's score is based upon: CHF History: 0 HTN History: 1 Diabetes History: 0 Stroke History: 2 Vascular Disease History: 1 Age Score: 2 Gender Score: 1       ASSESSMENT AND PLAN: 1. Persistent Atrial Fibrillation (ICD10:  I48.19) The patient's CHA2DS2-VASc score is 7, indicating a 11.2% annual risk of stroke.   Patient presents for dofetilide loading Continue Xarelto 15 mg daily, states no missed doses in the last 3 weeks. No recent benadryl use PharmD has screened medications Labs today show creatinine at 1.02, K+ 3.6 and mag 1.3, CrCl calculated at 40 mL/min. She will need both K+ and magnesium replacement before initiating dofetilide.  Continue nebivolol 10 mg daily  2. Secondary Hypercoagulable State (ICD10:  D68.69) The patient is at significant risk for stroke/thromboembolism based upon her CHA2DS2-VASc Score of 7.  Continue Rivaroxaban (Xarelto).   3. HTN Stable, no changes today.   To be admitted later  today once a bed becomes available.    Signed, Jorja Loa PA-C Af[facility] [address] [place], [place] [address] [phone]

## 2022-04-23 NOTE — TOC Benefit Eligibility Note (Signed)
Transition of Care [facility]) Benefit Eligibility Note    Patient Details  Name: Shonia Kellermann MRN: [id number] Date of Birth: [birth year]   Medication/Dose: DOFETILIDE  125MG  , 250 MG , 500 MG  BID  Covered?: Yes  Tier:  (TIER- 4 DRUG)  Prescription Coverage Preferred Pharmacy: [facility] with Person/Company/Phone Number:: TIMOTHY  @  [employer] # [phone]  Co-Pay: $102.50  Prior Approval: No  Deductible: Unmet (OUT-OF-POCKET:UNMET)  Additional Notes: TIKOSYN  : NOT Ledell Noss- YES  # [phone]    Mardene Sayer Phone Number: 04/23/2022, 4:34 PM

## 2022-04-23 NOTE — Progress Notes (Addendum)
Pharmacy: Dofetilide (Tikosyn) - Initial Consult Assessment and Electrolyte Replacement  Pharmacy consulted to assist in monitoring and replacing electrolytes in this 76 y.o. female admitted on 04/23/2022 undergoing dofetilide initiation.   Assessment:  Patient Exclusion Criteria: If any screening criteria checked as "Yes", then  patient  should NOT receive dofetilide until criteria item is corrected.  If "Yes" please indicate correction plan.  YES  NO Patient  Exclusion Criteria Correction Plan   [x]   []   Baseline QTc interval is greater than or equal to 440 msec. IF above YES box checked dofetilide contraindicated unless patient has ICD; then may proceed if QTc 500-550 msec or with known ventricular conduction abnormalities may proceed with QTc 550-600 msec. QTc = 440-460 per EP Continue Tikosyn with close monitoring   []   [x]   Patient is known or suspected to have a digoxin level greater than 2 ng/ml: No results found for: "DIGOXIN"     []   [x]   Creatinine clearance less than 20 ml/min (calculated using Cockcroft-Gault, actual body weight and serum creatinine): Estimated Creatinine Clearance: 39.7 mL/min (A) (by C-G formula based on SCr of 1.02 mg/dL (H)).     []   [x]  Patient has received drugs known to prolong the QT intervals within the last 48 hours (phenothiazines, tricyclics or tetracyclic antidepressants, erythromycin, H-1 antihistamines, cisapride, fluoroquinolones, azithromycin, ondansetron).   Updated information on QT prolonging agents is available to be searched on the following database:QT prolonging agents  Prozac and Mybetriq are conditional risk. No changed recommended at this time   []   [x]   Patient received a dose of hydrochlorothiazide (Oretic) alone or in any combination including triamterene (Dyazide, Maxzide) in the last 48 hours.    []   [x]  Patient received a medication known to increase dofetilide plasma concentrations prior to initial dofetilide dose:   Trimethoprim (Primsol, Proloprim) in the last 36 hours Verapamil (Calan, Verelan) in the last 36 hours or a sustained release dose in the last 72 hours Megestrol (Megace) in the last 5 days  Cimetidine (Tagamet) in the last 6 hours Ketoconazole (Nizoral) in the last 24 hours Itraconazole (Sporanox) in the last 48 hours  Prochlorperazine (Compazine) in the last 36 hours     []   [x]   Patient is known to have a history of torsades de pointes; congenital or acquired long QT syndromes.    []   [x]   Patient has received a Class 1 antiarrhythmic with less than 2 half-lives since last dose. (Disopyramide, Quinidine, Procainamide, Lidocaine, Mexiletine, Flecainide, Propafenone)    []   [x]   Patient has received amiodarone therapy in the past 3 months or amiodarone level is greater than 0.3 ng/ml.    Patient has been appropriately anticoagulated with Xarelto.  Labs:    Component Value Date/Time   K 3.6 04/23/2022 1142   MG 1.3 (L) 04/23/2022 1142     Plan: Potassium: -Potassium replaced per MD  Magnesium: -replaced per MD; recheck 4 hours after replacement   Thank you for allowing pharmacy to participate in this patient's care   Harland German, PharmD Clinical Pharmacist **Pharmacist phone directory can now be found on [email or web address] ([email or web address]).  Listed under [facility].

## 2022-04-24 ENCOUNTER — Encounter (HOSPITAL_COMMUNITY): Payer: Self-pay | Admitting: Internal Medicine

## 2022-04-24 ENCOUNTER — Inpatient Hospital Stay (HOSPITAL_COMMUNITY): Payer: Medicare Other

## 2022-04-24 ENCOUNTER — Other Ambulatory Visit: Payer: Self-pay

## 2022-04-24 DIAGNOSIS — I469 Cardiac arrest, cause unspecified: Secondary | ICD-10-CM | POA: Diagnosis not present

## 2022-04-24 DIAGNOSIS — I4819 Other persistent atrial fibrillation: Secondary | ICD-10-CM | POA: Diagnosis not present

## 2022-04-24 DIAGNOSIS — T50905A Adverse effect of unspecified drugs, medicaments and biological substances, initial encounter: Secondary | ICD-10-CM | POA: Insufficient documentation

## 2022-04-24 DIAGNOSIS — Z8674 Personal history of sudden cardiac arrest: Secondary | ICD-10-CM | POA: Diagnosis present

## 2022-04-24 DIAGNOSIS — I4721 Torsades de pointes: Secondary | ICD-10-CM

## 2022-04-24 LAB — BASIC METABOLIC PANEL
Anion gap: 8 (ref 5–15)
Anion gap: 17 — ABNORMAL HIGH (ref 5–15)
Anion gap: 13 (ref 5–15)
BUN: 18 mg/dL (ref 8–23)
BUN: 15 mg/dL (ref 8–23)
BUN: 15 mg/dL (ref 8–23)
CO2: 23 mmol/L (ref 22–32)
CO2: 20 mmol/L — ABNORMAL LOW (ref 22–32)
CO2: 19 mmol/L — ABNORMAL LOW (ref 22–32)
Calcium: 8.7 mg/dL — ABNORMAL LOW (ref 8.9–10.3)
Calcium: 9.6 mg/dL (ref 8.9–10.3)
Calcium: 9.1 mg/dL (ref 8.9–10.3)
Chloride: 102 mmol/L (ref 98–111)
Chloride: 98 mmol/L (ref 98–111)
Chloride: 98 mmol/L (ref 98–111)
Creatinine, Ser: 0.87 mg/dL (ref 0.44–1.00)
Creatinine, Ser: 1.06 mg/dL — ABNORMAL HIGH (ref 0.44–1.00)
Creatinine, Ser: 0.89 mg/dL (ref 0.44–1.00)
GFR, Estimated: >60 mL/min (ref >60)
GFR, Estimated: 54 mL/min — ABNORMAL LOW (ref >60)
GFR, Estimated: >60 mL/min (ref >60)
Glucose, Bld: 99 mg/dL (ref 70–99)
Glucose, Bld: 165 mg/dL — ABNORMAL HIGH (ref 70–99)
Glucose, Bld: 129 mg/dL — ABNORMAL HIGH (ref 70–99)
Potassium: 3.9 mmol/L (ref 3.5–5.1)
Potassium: 4.6 mmol/L (ref 3.5–5.1)
Potassium: 5 mmol/L (ref 3.5–5.1)
Sodium: 133 mmol/L — ABNORMAL LOW (ref 135–145)
Sodium: 135 mmol/L (ref 135–145)
Sodium: 130 mmol/L — ABNORMAL LOW (ref 135–145)

## 2022-04-24 LAB — MRSA NEXT GEN BY PCR, NASAL: MRSA by PCR Next Gen: NOT DETECTED

## 2022-04-24 LAB — PHOSPHORUS: Phosphorus: 2.4 mg/dL — ABNORMAL LOW (ref 2.5–4.6)

## 2022-04-24 LAB — MAGNESIUM
Magnesium: 2.2 mg/dL (ref 1.7–2.4)
Magnesium: 1.7 mg/dL (ref 1.7–2.4)
Magnesium: 2.7 mg/dL — ABNORMAL HIGH (ref 1.7–2.4)

## 2022-04-24 LAB — GLUCOSE, CAPILLARY: Glucose-Capillary: 175 mg/dL — ABNORMAL HIGH (ref 70–99)

## 2022-04-24 MED ORDER — HYDROMORPHONE HCL 1 MG/ML IJ SOLN
INTRAMUSCULAR | Status: AC
  Administered 2022-04-24: 1 mg
  Filled 2022-04-24: qty 1

## 2022-04-24 MED ORDER — DOFETILIDE 125 MCG PO CAPS
125.0000 ug | ORAL_CAPSULE | Freq: Once | ORAL | Status: AC
Start: 2022-04-24 — End: 2022-04-24
  Administered 2022-04-24: 125 ug via ORAL
  Filled 2022-04-24: qty 1

## 2022-04-24 MED ORDER — CHLORHEXIDINE GLUCONATE CLOTH 2 % EX PADS
6.0000 | MEDICATED_PAD | Freq: Every day | CUTANEOUS | Status: DC
  Administered 2022-04-24 – 2022-04-26 (×3): 6 via TOPICAL

## 2022-04-24 MED ORDER — DOPAMINE-DEXTROSE 3.2-5 MG/ML-% IV SOLN
2.5000 ug/kg/min | INTRAVENOUS | Status: DC
  Administered 2022-04-24: 2.5 ug/kg/min via INTRAVENOUS
  Filled 2022-04-24: qty 250

## 2022-04-24 MED ORDER — MAGNESIUM SULFATE 2 GM/50ML IV SOLN
2.0000 g | Freq: Once | INTRAVENOUS | Status: AC
Start: 2022-04-24 — End: 2022-04-24
  Administered 2022-04-24: 2 g via INTRAVENOUS
  Filled 2022-04-24: qty 50

## 2022-04-24 MED ORDER — SODIUM CHLORIDE 0.9 % IV SOLN
250.0000 mL | INTRAVENOUS | Status: AC | PRN
Start: 2022-04-24 — End: 2022-04-25
  Administered 2022-04-25: 250 mL via INTRAVENOUS

## 2022-04-24 MED ORDER — HYDROCORTISONE 1 % EX CREA
TOPICAL_CREAM | Freq: Three times a day (TID) | CUTANEOUS | Status: DC
  Filled 2022-04-24 (×2): qty 28

## 2022-04-24 MED ORDER — ACETAMINOPHEN 500 MG PO TABS
500.0000 mg | ORAL_TABLET | Freq: Four times a day (QID) | ORAL | Status: DC
  Administered 2022-04-25 – 2022-05-02 (×24): 500 mg via ORAL
  Filled 2022-04-24 (×26): qty 1

## 2022-04-24 MED ORDER — OXYCODONE HCL 5 MG PO TABS
5.0000 mg | ORAL_TABLET | ORAL | Status: DC | PRN
  Administered 2022-04-24: 5 mg via ORAL
  Filled 2022-04-24: qty 1

## 2022-04-24 MED ORDER — MAGNESIUM SULFATE 2 GM/50ML IV SOLN
2.0000 g | Freq: Once | INTRAVENOUS | Status: AC
  Administered 2022-04-24: 2 g via INTRAVENOUS
  Filled 2022-04-24: qty 50

## 2022-04-24 MED ORDER — LIDOCAINE 5 % EX PTCH
2.0000 | MEDICATED_PATCH | CUTANEOUS | Status: AC
  Administered 2022-04-24 – 2022-04-26 (×3): 2 via TRANSDERMAL
  Filled 2022-04-24 (×3): qty 2

## 2022-04-24 MED ORDER — DOFETILIDE 125 MCG PO CAPS
125.0000 ug | ORAL_CAPSULE | Freq: Two times a day (BID) | ORAL | Status: DC
  Filled 2022-04-24: qty 1

## 2022-04-24 MED ORDER — SCOPOLAMINE 1 MG/3DAYS TD PT72
1.0000 | MEDICATED_PATCH | TRANSDERMAL | Status: DC
Start: 2022-04-24 — End: 2022-04-25
  Administered 2022-04-24 – 2022-04-25 (×2): 1.5 mg via TRANSDERMAL
  Filled 2022-04-24 (×2): qty 1

## 2022-04-24 MED ORDER — ACETAMINOPHEN 10 MG/ML IV SOLN
1000.0000 mg | Freq: Once | INTRAVENOUS | Status: AC
  Administered 2022-04-24: 1000 mg via INTRAVENOUS
  Filled 2022-04-24: qty 100

## 2022-04-24 MED ORDER — POTASSIUM CHLORIDE CRYS ER 20 MEQ PO TBCR
20.0000 meq | EXTENDED_RELEASE_TABLET | Freq: Once | ORAL | Status: AC
Start: 2022-04-24 — End: 2022-04-24
  Administered 2022-04-24: 20 meq via ORAL
  Filled 2022-04-24: qty 1

## 2022-04-24 MED ORDER — HYDROMORPHONE HCL 1 MG/ML IJ SOLN
0.5000 mg | INTRAMUSCULAR | Status: DC | PRN
  Administered 2022-04-24: 0.5 mg via INTRAVENOUS
  Administered 2022-04-25 (×2): 1 mg via INTRAVENOUS
  Filled 2022-04-24 (×4): qty 1

## 2022-04-24 NOTE — Progress Notes (Signed)
Pharmacy: Dofetilide (Tikosyn) - Follow Up Assessment and Electrolyte Replacement  Pharmacy consulted to assist in monitoring and replacing electrolytes in this 76 y.o. female admitted on 04/23/2022 undergoing dofetilide initiation.  Labs:    Component Value Date/Time   K 3.9 04/24/2022 0146   MG 2.2 04/24/2022 0146     Plan: Potassium: -replaced per MD  Magnesium: Mg > 2: No additional supplementation needed   Thank you for allowing pharmacy to participate in this patient's care   Hildred Laser, PharmD Clinical Pharmacist **Pharmacist phone directory can now be found on [email or web address] ([email or web address]).  Listed under [place].

## 2022-04-24 NOTE — Progress Notes (Signed)
Per EP, Dr. Curt Bears, and CCM, Dr. Lynetta Mare, if pt starts having more PVC's with increasing frequency, to call Cardiology and get a drip for Dopamine.

## 2022-04-24 NOTE — Assessment & Plan Note (Addendum)
Due to torsade  Brief CPR  Received magnesium C/O chest pain from compressions.  CXR shows no rib fractures  - wean O2 - multimodal pain control to avoid splinting and atelectasis

## 2022-04-24 NOTE — Assessment & Plan Note (Addendum)
Patient was being loaded with dofedilite for AF Dose had been decreased this morning for QTc 515-520 - patient was in NSR Required 2 min CPR for torsade. Mg: 2.6 Qtc now <500. Remains in SR with no PVC's

## 2022-04-24 NOTE — Assessment & Plan Note (Signed)
No evidence of bruising, still tender along sterno-costal border.  Continue Xarelto for now but monitor for bruising following CPR.

## 2022-04-24 NOTE — Progress Notes (Addendum)
Electrophysiology Rounding Note  Patient Name: Marissa [name] Date of Encounter: 04/24/2022  Primary Cardiologist: [name], MD  Electrophysiologist: [name], MD    Subjective   Pt converted to sinus rhythm on Tikosyn 250 mcg BID   QTc from EKG last pm shows  borderline/prolonged QT  at ~510-520  The patient is doing well today.  At this time, the patient denies chest pain, shortness of breath, or any new concerns.  Inpatient Medications    Scheduled Meds:  dofetilide  250 mcg Oral Once   dofetilide  250 mcg Oral BID   FLUoxetine  40 mg Oral Daily   levothyroxine  125 mcg Oral Q0600   mirabegron ER  50 mg Oral Daily   montelukast  10 mg Oral QHS   nebivolol  10 mg Oral Daily   rivaroxaban  15 mg Oral Q supper   sodium chloride flush  3 mL Intravenous Q12H   Continuous Infusions:  sodium chloride     PRN Meds: sodium chloride, acetaminophen, albuterol, ALPRAZolam, carisoprodol, cloNIDine, metoprolol tartrate, sodium chloride flush, zolpidem   Vital Signs    Vitals:   04/23/22 1530 04/23/22 2009 04/23/22 2109 04/23/22 2337  BP: (!) 150/91 (!) 165/126 102/80 (!) 160/111  Pulse: 92  95 91  Resp: 16 18 20    Temp: 97.9 F (36.6 C) 98.4 F (36.9 C) 97.6 F (36.4 C) 97.7 F (36.5 C)  TempSrc: Oral Oral Oral Oral  SpO2: 97%  98%   Weight:      Height:        Intake/Output Summary (Last 24 hours) at 04/24/2022 0707 Last data filed at 04/23/2022 1517 Gross per 24 hour  Intake 300 ml  Output --  Net 300 ml   Filed Weights   04/23/22 1252  Weight: 54.5 kg    Physical Exam    GEN- The patient is well appearing, alert and oriented x 3 today.   Head- normocephalic, atraumatic Eyes-  Sclera clear, conjunctiva pink Ears- hearing intact Oropharynx- clear Neck- supple Lungs- Clear to ausculation bilaterally, normal work of breathing Heart- Regular rate and rhythm, no murmurs, rubs or gallops GI- soft, NT, ND, + BS Extremities- no clubbing,  cyanosis, or edema Skin- no rash or lesion Psych- euthymic mood, full affect Neuro- strength and sensation are intact  Labs    CBC No results for input(s): "WBC", "NEUTROABS", "HGB", "HCT", "MCV", "PLT" in the last 72 hours. Basic Metabolic Panel Recent Labs    04/23/22 1142 04/23/22 2025 04/24/22 0146  NA 136  --  133*  K 3.6  --  3.9  CL 99  --  102  CO2 19*  --  23  GLUCOSE 101*  --  99  BUN 15  --  18  CREATININE 1.02*  --  0.87  CALCIUM 9.3  --  8.7*  MG 1.3* 2.7* 2.2    Potassium  Date/Time Value Ref Range Status  04/24/2022 01:46 AM 3.9 3.5 - 5.1 mmol/L Final   Magnesium  Date/Time Value Ref Range Status  04/24/2022 01:46 AM 2.2 1.7 - 2.4 mg/dL Final    Comment:    Performed at [facility], [place] [address], [place], [place] [address]    Telemetry    AF 70-80s converting to NSR 50-60s ~ 0315 (personally reviewed)  Radiology    No results found.   Patient Profile     Marissa [name] is a 76 y.o. female with a past  medical history significant for persistent atrial fibrillation.  They were admitted for tikosyn load.   Assessment & Plan    Persistent atrial fibrillation Pt converted to sinus rhythm on Tikosyn 250 mcg BID  Continue Xarelto K 3.9, Mg 2.2 CHA2DS2VASC is at least 6.   Patient Eilidh Marcano not require cardioversion.  With QT prolongation, Epifanio Labrador decrease next dose to 125 mg BID   For questions or updates, please contact [place] Please consult [email or web address] for contact info under Cardiology/STEMI.  Signed, Shirley Friar, PA-C  04/24/2022, 7:07 AM   I have seen and examined this patient with Oda Kilts.  Agree with above, note added to reflect my findings.  Converted to sinus rhythm.  Feeling well.  GEN: Well nourished, well developed, in no acute distress  HEENT: normal  Neck: no JVD, carotid bruits, or masses Cardiac: RRR; no murmurs, rubs, or gallops,no edema  Respiratory:  clear to auscultation bilaterally,  normal work of breathing GI: soft, nontender, nondistended, + BS MS: no deformity or atrophy  Skin: warm and dry Neuro:  Strength and sensation are intact Psych: euthymic mood, full affect   Persistent atrial fibrillation: Has converted to sinus rhythm.  Feeling improved.  QTc prolonged.  We Milliani Herrada reduce the dose to 125 mcg twice daily.  Bacilio Abascal M. Allaya Abbasi MD 04/24/2022 7:31 AM

## 2022-04-24 NOTE — Progress Notes (Signed)
eLink Physician-Brief Progress Note Patient Name: Marissa [name] DOB: 1945/11/29 MRN: [id number]   Date of Service  04/24/2022  HPI/Events of Note  Patient requesting medication for nausea, QTC 519, and she had an episode of Torsades earlier today, serum Na+ is 130.  eICU Interventions  Scopolamine patch ordered for nausea, Normal Saline gtt rate increased to 30 ml / hour x 24 hours.        Kerry Kass Harrold Fitchett 04/24/2022, 10:55 PM

## 2022-04-24 NOTE — Code Documentation (Signed)
Code Blue paged at 1428. Cancelled at 1430 prior to my arrival.   On arrival, pt laying in bed. VSS, RN stated pt had episode of Torsades and received approx 2 mins of compressions prior to regaining consciousness. Labs obtained, orders to transfer to ICU. On transport, pt with another episode of torsades while in hallway at 1503 (see code sheet). Transferred to 4O00, EP team aware and to bedside. CCM consulted.

## 2022-04-24 NOTE — Progress Notes (Signed)
On call cards paged re: Qtc [phone]

## 2022-04-24 NOTE — Progress Notes (Signed)
NAME:  Marissa [name], MRN:  [id number], DOB:  1946/09/17, LOS: 1 ADMISSION DATE:  04/23/2022, CONSULTATION DATE:  04/24/2022 REFERRING MD:  Curt Bears , CHIEF COMPLAINT:  torsade   History of Present Illness:  76 year old woman who was being loaded with Tikosyn per protocol for persistent atrial fibrillation who suffered cardiac arrest from torsade. Qtc remained prolonged despite holding dofetilide.   AF originally diagnosed during stroke work-up in 2022. Patient is unaware of arrhythmia.  LV normal by echo with mild aortic stenosis.   Pertinent  Medical History   Past Medical History:  Diagnosis Date   Arthritis    Asthma    Bicuspid aortic valve 05/2017   Likely functional bicuspid aortic valve with sclerosis and no stenosis.   Bursitis    Cataract    mild   Dysthymia    Hemorrhoids    Hx of ulcerative colitis    per dr Arnoldo Morale as per pt.   Hyperlipidemia    Labile hypertension    managed - labile.   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 Roxan Hockey)   Thyroid disease      Significant Hospital Events: Including procedures, antibiotic start and stop dates in addition to other pertinent events   6/27 - admitted for Tikosyn loading.  6/28 - torsade with Qtc prolongation.   Interim History / Subjective:  Awake and alert. Complains of chest pain following CPR  Objective   Blood pressure (!) 160/92, pulse 71, temperature 98 F (36.7 C), temperature source Oral, resp. rate 17, height 5' 4"  (1.626 m), weight 54.5 kg, SpO2 100 %.        Intake/Output Summary (Last 24 hours) at 04/24/2022 1547 Last data filed at 04/24/2022 3016 Gross per 24 hour  Intake 120 ml  Output --  Net 120 ml   Filed Weights   04/23/22  1252  Weight: 54.5 kg    Examination: General: elderly woman lying in bed.  HENT: normal oropharynx Lungs: chest clear, point tenderness along sternal border bilaterally  Cardiovascular: HS normal with no edema or JVD Abdomen: soft  Extremities: no deformities.  Neuro: nil focal  GU: deferred  Ancillary tests personally reviewed  Magnesium 2.2  Assessment & Plan:  Drug-induced torsades de pointes ([place]) Patient was being loaded with dofedilite for AF Dose had been decreased this morning for QTc 515-520 - patient was in NSR Required 2 min CPR for torsade. Mg: 2.2    Cardiac arrest ([place]) Due to torsade  Brief CPR  Received magnesium C/O chest pain from compressions.  CXR shows no rib fractures  - wean O2 - multimodal pain control to avoid splinting and atelectasis   Persistent atrial fibrillation ([place]): CHA2DS2-VASc Score = 7  Continue Xarelto for now but monitor for bruising following CPR.   Best Practice (right click and "Reselect all SmartList Selections" daily)   Diet/type: Regular consistency (see orders) DVT prophylaxis: other GI prophylaxis: N/A Lines: N/A Foley:  N/A Code Status:  full code Last date of multidisciplinary goals of care discussion [patient and family updated 6/28]  CRITICAL CARE Performed by: Kipp Brood   Total critical care time: 40 minutes  Critical  care time was exclusive of separately billable procedures and treating other patients.  Critical care was necessary to treat or prevent imminent or life-threatening deterioration.  Critical care was time spent personally by me on the following activities: development of treatment plan with patient and/or surrogate as well as nursing, discussions with consultants, evaluation of patient's response to treatment, examination of patient, obtaining history from patient or surrogate, ordering and performing treatments and interventions, ordering and review of laboratory studies, ordering and  review of radiographic studies, pulse oximetry, re-evaluation of patient's condition and participation in multidisciplinary rounds.  Kipp Brood, MD [facility] ICU Physician [place]  Pager: [phone] Mobile: [phone] After hours: [phone].

## 2022-04-24 NOTE — Progress Notes (Signed)
   04/24/22 1405  Clinical Encounter Type  Visited With Health care provider;Family  Visit Type Code;Critical Care;Initial  Referral From Nurse Juanda Crumble R. Prewett, RN)  Consult/Referral To Chaplain Albertina Parr [employer])  Recommendations PAGED FOR CODE BLUE  Spiritual Encounters  Spiritual Needs Emotional;Grief support   Responded to page for Code BLUE. Consultation with responding physicians, nursing director, and patient's primary nurse in order to provide accurate information to patient's family. Met with Mr. Christo in [facility] Waiting Room and accompanied him to see his wife. Chaplain provided meaningful presence and reflective listening that allowed Mr. Honeyman opportunity to share his grief and fears. [address] [place], [place] [phone]

## 2022-04-24 NOTE — Progress Notes (Addendum)
  Paged for Code [facility] after patient had episode of Torsades.   Pt had approx 2 minutes of compressions prior to regaining consciousness.   Review of tele shows at a brief episode of torsades, followed by a second episode of torsades devolving to VF.   Pads have been placed. STAT Labs.   Supplement Mg empirically.   We had previously held evening dose of tikosyn with QT prolongation. She will no longer be a tikosyn candidate.  Will move to ICU.   Legrand Como [address]" [place], PA-C  04/24/2022 2:42 PM

## 2022-04-24 NOTE — Progress Notes (Signed)
Morning EKG reviewed    Shows remains in NSR, but unfortunately her QT has prolonged to the 515-520 ms range.   Discussed with Dr. [name].   HOLD Tikosyn tonight.   If QT recovers significantly, may be able to try 125 mcg again tomorrow am.   More likely, has failed tikosyn and will need to consider rate control alone vs eventual AV nodal ablation and Pacing.   Discussed at length with patient and husband  Disposition pending plan tomorrow am.   Marissa [name]  Pager: [phone]  04/24/2022 12:50 PM

## 2022-04-25 DIAGNOSIS — F05 Delirium due to known physiological condition: Secondary | ICD-10-CM | POA: Diagnosis present

## 2022-04-25 DIAGNOSIS — F0390 Unspecified dementia without behavioral disturbance: Secondary | ICD-10-CM | POA: Diagnosis present

## 2022-04-25 DIAGNOSIS — I469 Cardiac arrest, cause unspecified: Secondary | ICD-10-CM | POA: Diagnosis not present

## 2022-04-25 DIAGNOSIS — I4819 Other persistent atrial fibrillation: Secondary | ICD-10-CM | POA: Diagnosis not present

## 2022-04-25 LAB — BASIC METABOLIC PANEL
Anion gap: 13 (ref 5–15)
BUN: 12 mg/dL (ref 8–23)
CO2: 22 mmol/L (ref 22–32)
Calcium: 8.9 mg/dL (ref 8.9–10.3)
Chloride: 96 mmol/L — ABNORMAL LOW (ref 98–111)
Creatinine, Ser: 0.85 mg/dL (ref 0.44–1.00)
GFR, Estimated: >60 mL/min (ref >60)
Glucose, Bld: 143 mg/dL — ABNORMAL HIGH (ref 70–99)
Potassium: 4.1 mmol/L (ref 3.5–5.1)
Sodium: 131 mmol/L — ABNORMAL LOW (ref 135–145)

## 2022-04-25 LAB — MAGNESIUM: Magnesium: 2.6 mg/dL — ABNORMAL HIGH (ref 1.7–2.4)

## 2022-04-25 MED ORDER — OXYCODONE-ACETAMINOPHEN 5-325 MG PO TABS: 1.0000 | ORAL_TABLET | ORAL | Status: DC | PRN

## 2022-04-25 MED ORDER — POTASSIUM CHLORIDE CRYS ER 20 MEQ PO TBCR
20.0000 meq | EXTENDED_RELEASE_TABLET | Freq: Once | ORAL | Status: AC
Start: 2022-04-25 — End: 2022-04-25
  Administered 2022-04-25: 20 meq via ORAL
  Filled 2022-04-25: qty 1

## 2022-04-25 MED ORDER — TRIMETHOBENZAMIDE HCL 100 MG/ML IM SOLN
200.0000 mg | Freq: Three times a day (TID) | INTRAMUSCULAR | Status: DC | PRN
  Filled 2022-04-25: qty 2

## 2022-04-25 MED ORDER — KETOROLAC TROMETHAMINE 15 MG/ML IJ SOLN
15.0000 mg | Freq: Three times a day (TID) | INTRAMUSCULAR | Status: AC | PRN
  Administered 2022-04-25 – 2022-04-29 (×6): 15 mg via INTRAVENOUS
  Filled 2022-04-25 (×9): qty 1

## 2022-04-25 MED ORDER — OXYCODONE HCL 5 MG PO TABS
5.0000 mg | ORAL_TABLET | ORAL | Status: DC | PRN
  Administered 2022-04-25 – 2022-05-02 (×13): 5 mg via ORAL
  Filled 2022-04-25 (×16): qty 1

## 2022-04-25 MED ORDER — OLANZAPINE 10 MG IM SOLR
5.0000 mg | Freq: Once | INTRAMUSCULAR | Status: AC | PRN
Start: 2022-04-25 — End: 2022-04-25
  Administered 2022-04-25: 5 mg via INTRAMUSCULAR
  Filled 2022-04-25: qty 10

## 2022-04-25 MED ORDER — LORAZEPAM 0.5 MG PO TABS
0.5000 mg | ORAL_TABLET | Freq: Two times a day (BID) | ORAL | Status: DC | PRN
Start: 2022-04-25 — End: 2022-04-29
  Administered 2022-04-25 – 2022-04-29 (×4): 0.5 mg via ORAL
  Filled 2022-04-25 (×4): qty 1

## 2022-04-25 NOTE — Progress Notes (Signed)
Pt with several episodes of vomiting overnight and currently. Scopolamine patch fell off and became soiled. Will place new on as available. Unclear when patch became loose, so uncertain if it was effective.

## 2022-04-25 NOTE — Progress Notes (Signed)
NAME:  Marissa [name], MRN:  [id number], DOB:  January 06, 1946, LOS: 2 ADMISSION DATE:  04/23/2022, CONSULTATION DATE:  04/24/2022 REFERRING MD:  Curt Bears , CHIEF COMPLAINT:  torsade   History of Present Illness:  76 year old woman who was being loaded with Tikosyn per protocol for persistent atrial fibrillation who suffered cardiac arrest from torsade. Qtc remained prolonged despite holding dofetilide.   AF originally diagnosed during stroke work-up in 2022. Patient is unaware of arrhythmia.  LV normal by echo with mild aortic stenosis.   Pertinent  Medical History   Past Medical History:  Diagnosis Date   Arthritis    Asthma    Bicuspid aortic valve 05/2017   Likely functional bicuspid aortic valve with sclerosis and no stenosis.   Bursitis    Cataract    mild   Dysthymia    Hemorrhoids    Hx of ulcerative colitis    per dr Arnoldo Morale as per pt.   Hyperlipidemia    Labile hypertension    managed - labile.   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 Roxan Hockey)   Thyroid disease      Significant Hospital Events: Including procedures, antibiotic start and stop dates in addition to other pertinent events   6/27 - admitted for Tikosyn loading.  6/28 - Two torsade events at 2 and 3p with Qtc prolongation, both requiring brief episodes of chest compressions.    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 Also developed PVC burden necessitating Dopamine gtt which was paused at [place] 6/29. Also developed nausea and vomiting after initiating Dopamine, and was given Scopolamine patch which became displaced within a few hours of placement.  Couldn't tolerate PO Tylenol due to nausea. Has been in normal sinus rhythm after Tikosyn loading but likely to return to Afib.   This morning she is alert and oriented to herself and her daughter, but not to place and time. Endorses some residual chest pain after two episodes of compressions yesterday for PEA. Daughter and husband both at bedside and corroborate patient's baseline and new delirium. Pt with improved nausea and no further episodes of emesis in last several hours.  Objective   Blood pressure 111/60, pulse 65, temperature 98.9 F (37.2 C), resp. rate 18, height 5' 4"  (1.626 m), weight 54.5 kg, SpO2 97 %.        Intake/Output Summary (Last 24 hours) at 04/25/2022 1107 Last data filed at 04/25/2022 1023 Gross per 24 hour  Intake 197.95 ml  Output 450 ml  Net -252.05 ml    Filed Weights   04/23/22 1252  Weight: 54.5 kg    Examination: General: elderly woman lying comfortably in bed.  HENT: normal oropharynx and no JVD Lungs: chest clear, point tenderness along sternal border bilaterally  Cardiovascular: HS normal with no edema or JVD Abdomen: soft and nontender without guarding or distension Extremities: no deformities.  Neuro: Alert and oriented to self and family only. CN II-XII grossly intact.   Ancillary tests personally reviewed  Magnesium 2.2  Assessment & Plan:   Drug-induced torsades de pointes  Cardiac arrest  Complicated by Vfib and cardiac arrest x2 on 6/28, requiring chest compressions, one hour apart, in the setting of Tikosyn initiation with QT prolongation. QT has improved today to consistently less than 500 and pt is now in normal sinus rhythm. Had PVC burden overnight and had Dopamine gtt initiated, now held. Pain improving from chest compressions and de-escalating analgesia, as below. No rib fx on CXR.  - Replacing potassium for >4 and Magnesium for >2  Persistent atrial fibrillation CHA2DS2-VASc score of 7. Currently in NSR but expect to return  to AF after failing both Amiodarone and now Tikosyn initiation. Electrophysiology following.  - Management per Electrophysiology - Continue Xarelto  Delirium Developed overnight 6/28-6/29 and received one time Olanzapine (least QT effects). 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 With improvement in pain, will transition patient from opioid analgesia to NSAIDs (has normal Cr and kidney function), replace Xanax with longer-acting Ativan, and continue delirium precautions. Family consistently present and helping re-orient patient.  - Stop Xanax, Dilaudid, Oxycodone - Start Lorazepam 0.529mBID prn - Start Ketorolac 1596mV q8hrs prn - Continue Tylenol 500m4m   Nausea- improving Began after beginning Dopamine gtt, as above. Improved after scopolamine patch and Tigan injection. Holding off QT affecting drugs.  - Tigan 200mg57mrs prn   Best Practice (right click and "Reselect all SmartList Selections" daily)   Diet/type: Regular consistency (see orders) DVT prophylaxis: other GI prophylaxis: N/A Lines: N/A Foley:  N/A Code Status:  full code Last date of multidisciplinary goals of care discussion [patient and family updated 6/28]   Signed,  SchuyBaldwin [place]

## 2022-04-25 NOTE — Progress Notes (Signed)
Electrophysiology Rounding Note  Patient Name: Marissa [name] Date of Encounter: 04/25/2022  Primary Cardiologist: [name], MD Electrophysiologist: [name], MD   Subjective   Delirium noted overnight as well as N/V.   No further VT/VF  Inpatient Medications    Scheduled Meds:  acetaminophen  500 mg Oral Q6H   Chlorhexidine Gluconate Cloth  6 each Topical Daily   FLUoxetine  40 mg Oral Daily   hydrocortisone cream   Topical TID   levothyroxine  125 mcg Oral Q0600   lidocaine  2 patch Transdermal Q24H   mirabegron ER  50 mg Oral Daily   montelukast  10 mg Oral QHS   nebivolol  10 mg Oral Daily   rivaroxaban  15 mg Oral Q supper   scopolamine  1 patch Transdermal Q72H   sodium chloride flush  3 mL Intravenous Q12H   Continuous Infusions:  sodium chloride     DOPamine 2.5 mcg/kg/min (04/24/22 2044)   PRN Meds: sodium chloride, acetaminophen, albuterol, ALPRAZolam, carisoprodol, cloNIDine, HYDROmorphone (DILAUDID) injection, metoprolol tartrate, oxyCODONE, sodium chloride flush, trimethobenzamide, zolpidem   Vital Signs    Vitals:   04/25/22 0400 04/25/22 0430 04/25/22 0500 04/25/22 0530  BP: (!) 140/118  (!) 206/171 138/69  Pulse: 69 68 71 74  Resp: 16 19 (!) 23 (!) 25  Temp:      TempSrc:      SpO2: 100% 100% 95% 94%  Weight:      Height:        Intake/Output Summary (Last 24 hours) at 04/25/2022 9628 Last data filed at 04/25/2022 0300 Gross per 24 hour  Intake 281.58 ml  Output 300 ml  Net -18.42 ml   Filed Weights   04/23/22 1252  Weight: 54.5 kg    Physical Exam    GEN- The patient is elderly and fatigued appearing, alert and oriented x 2 today.   Head- normocephalic, atraumatic Eyes-  Sclera clear, conjunctiva pink Ears- hearing intact Oropharynx- clear Neck- supple Lungs- Clear to ausculation bilaterally, normal work of breathing Heart- Regular rate and rhythm, no murmurs, rubs or gallops GI- soft, NT, ND, +  BS Extremities- no clubbing or cyanosis. No edema Skin- no rash or lesion Psych- euthymic mood, full affect Neuro- strength and sensation are intact  Labs    CBC No results for input(s): "WBC", "NEUTROABS", "HGB", "HCT", "MCV", "PLT" in the last 72 hours. Basic Metabolic Panel Recent Labs    04/24/22 1458 04/24/22 1938 04/25/22 0116  NA 135 130* 131*  K 4.6 5.0 4.1  CL 98 98 96*  CO2 20* 19* 22  GLUCOSE 165* 129* 143*  BUN 15 15 12   CREATININE 1.06* 0.89 0.85  CALCIUM 9.6 9.1 8.9  MG 1.7 2.7* 2.6*  PHOS 2.4*  --   --    Liver Function Tests No results for input(s): "AST", "ALT", "ALKPHOS", "BILITOT", "PROT", "ALBUMIN" in the last 72 hours. No results for input(s): "LIPASE", "AMYLASE" in the last 72 hours. Cardiac Enzymes No results for input(s): "CKTOTAL", "CKMB", "CKMBINDEX", "TROPONINI" in the last 72 hours.   Telemetry    NSR 60-70s (personally reviewed)  Radiology    DG CHEST PORT 1 VIEW  Result Date: 04/24/2022 CLINICAL DATA:  Ventricular tachycardia, post CPR. EXAM: PORTABLE CHEST 1 VIEW COMPARISON:  Chest radiograph 08/06/2021 FINDINGS: AP view of the chest demonstrates cardiac pads overlying the chest. Electronic device overlying the left chest may represent a loop recorder. Old left rib fractures. Negative for pneumothorax.  Heart and mediastinum are within normal limits. No significant airspace disease or pulmonary edema. Few densities at the medial right lung base are likely chronic. Old posttraumatic changes involving the lateral right clavicle. IMPRESSION: No acute chest findings. Electronically Signed   By: Markus Daft M.D.   On: 04/24/2022 15:27    Patient Profile     Marissa [name] is a 76 y.o. female with a past medical history significant for persistent atrial fibrillation.  She was admitted for tikosyn load.   On 6/28 tikosyn was held due to QT prolongation. She ended up having several episodes of torsades and VF requiring brief CPR and moved to the  ICD  Assessment & Plan    Torsades de pointes / VF / PMVT Cardiac arrest In the setting of tikosyn use.  QT has improved and no further VT/VF since yesterday afternoon ~1500 Keep K > 4.0 and Mg > 2.0  CXR with no rib fractures from CPR  2. Persistent atrial fibrillation She is currently in NSR form initial tikosyn load, will very likely return to AF.  She has failed amiodarone and now tikosyn.  She has baseline bradycardia and is not a good flecainide candidate.  Will have to pursue rate control alone. Ultimately may end up need AVJ ablation and pacing.   If remains stable this am, will move back to floor now that Phyllis Ginger is out of her system.   Dr. [name] has seen.   For questions or updates, please contact [place] Please consult [email or web address] for contact info under Cardiology/STEMI.  Signed, Shirley Friar, PA-C  04/25/2022, 7:02 AM

## 2022-04-25 NOTE — Assessment & Plan Note (Signed)
Multifactorial due to ICU environment, recent brief arrest, narcotics, underlying dementia and possible Xanax withdrawal.  On examination, nil focal but patient inattentive and speech at times rambling.   - Stop all narcotics  - Trial of lorazepam for possible Xanax withdrawal.  - Minimize environmental stimulation.

## 2022-04-25 NOTE — Progress Notes (Signed)
0010 This RN enters room in response to call light. Patient is apologetic and states "I think I wet the bed. I am so sorry." This RN reassures patient  that everything is okay and encourages expression of feelings. Upon assessment from this RN and NT on duty, bed is dry. Purewick remains in place and is still functioning properly. Patient reeducated and oriented to room and equipment. Patient repositioned for optimal lung expansion. Patient continues to complain of pain despite pain meds being administered by this RN. Patient requests water. This RN provides water after ensuring patient is no longer complaining of nausea and HOB is elevated to 45 degrees. This RN adds soothing music for patient anxiety, dims light to decrease stimulation and states "okay. Good night Ms. Marissa [name]." Patient retorts " you need to work on how you speak to me. I don't like that." This RN asks "Oh, I thought that's what you told me to call you at the beginning of the shift. Would you like me to call you something other than your name?" Patient responds "No I want you to call me my name. I don't like how you said that and you should know that. You know what an attitude you have. I don't know why you're being so mean to me. Go get your charge nurse right now. I want to speak to her." This RN states "Of course, we actually have a female charge nurse tonight. Let me tell him you would like to speak to him. He should be in shortly." This RN exits room and hears last response of "That attitude."

## 2022-04-25 NOTE — Progress Notes (Signed)
eLink Physician-Brief Progress Note Patient Name: Marissa [name] DOB: 09/26/1946 MRN: [id number]   Date of Service  04/25/2022  HPI/Events of Note  Patient with persistent nausea and vomiting, Scopolamine patch fell off secondary to diaphoretic skin, she also has mild agitated delirium.  eICU Interventions  Tigan 200 mg iv Q 8 hours PRN nausea ordered. Zyprexa 5 mg IM x 1 prn extreme agitation ordered.        Kerry Kass Getsemani Lindon 04/25/2022, 4:40 AM

## 2022-04-26 ENCOUNTER — Encounter: Payer: Self-pay | Admitting: Cardiology

## 2022-04-26 DIAGNOSIS — I4819 Other persistent atrial fibrillation: Secondary | ICD-10-CM | POA: Diagnosis not present

## 2022-04-26 LAB — MAGNESIUM: Magnesium: 2.1 mg/dL (ref 1.7–2.4)

## 2022-04-26 LAB — BASIC METABOLIC PANEL
Anion gap: 7 (ref 5–15)
BUN: 18 mg/dL (ref 8–23)
CO2: 24 mmol/L (ref 22–32)
Calcium: 9.3 mg/dL (ref 8.9–10.3)
Chloride: 99 mmol/L (ref 98–111)
Creatinine, Ser: 1.28 mg/dL — ABNORMAL HIGH (ref 0.44–1.00)
GFR, Estimated: 43 mL/min — ABNORMAL LOW (ref >60)
Glucose, Bld: 118 mg/dL — ABNORMAL HIGH (ref 70–99)
Potassium: 4.8 mmol/L (ref 3.5–5.1)
Sodium: 130 mmol/L — ABNORMAL LOW (ref 135–145)

## 2022-04-26 MED ORDER — LORAZEPAM 2 MG/ML IJ SOLN
1.0000 mg | Freq: Once | INTRAMUSCULAR | Status: AC
  Administered 2022-04-26: 1 mg via INTRAVENOUS
  Filled 2022-04-26: qty 1

## 2022-04-26 NOTE — Care Management Important Message (Signed)
Important Message  Patient Details  Name: Marissa [name] MRN: [id number] Date of Birth: 21-Jul-1946   Medicare Important Message Given:  Yes     Shelda Altes 04/26/2022, 9:29 AM

## 2022-04-26 NOTE — Progress Notes (Signed)
Mobility Specialist Progress Note    04/26/22 1541  Mobility  Activity Refused mobility   RN agreeable but husband said the pt cannot walk and he doesn't want to do anything that will cause her pain.   Hildred Alamin Mobility Specialist

## 2022-04-26 NOTE — Progress Notes (Signed)
PT Cancellation Note  Patient Details Name: Marissa [name] MRN: [id number] DOB: [birth year]   Cancelled Treatment:    Reason Eval/Treat Not Completed: Other (comment); spouse reports pt has been very restless all day and just now settled down for last hour.  He reports she is still very painful in her chest and screams when getting up.  Prefers PT to check back next day.  Will continue attempts.    Reginia Naas 04/26/2022, 3:49 PM Magda Kiel, PT Acute Rehabilitation Services Office:[phone] 04/26/2022

## 2022-04-26 NOTE — Progress Notes (Signed)
A&O x1 (self). Will try to follow commands but has poor attention, and while she speaks clearly, tells nonsensical run-on stories/answers. Possibly worsened overnight; began to speak to self more and speak as if someone else was in the room. Very fidgety and restless in bed. Did not sleep much if any overnight. Increased chest pain. Paged physician and new order for PRN oxycodone added. Given along with scheduled tylenol, and PRN toradol and ativan through the night, though not sure how much it helped. Patient not always able to articulate pain well, but would moan and yell and react (ie facial grimace, tense, etc) suggesting significant pain.

## 2022-04-26 NOTE — Progress Notes (Addendum)
Electrophysiology Rounding Note  Patient Name: Marissa [name] Date of Encounter: 04/26/2022  Primary Cardiologist: [name], MD Electrophysiologist: [name], MD   Subjective   No further VT.  Up most of the night and confused   Inpatient Medications    Scheduled Meds:  acetaminophen  500 mg Oral Q6H   Chlorhexidine Gluconate Cloth  6 each Topical Daily   FLUoxetine  40 mg Oral Daily   hydrocortisone cream   Topical TID   levothyroxine  125 mcg Oral Q0600   lidocaine  2 patch Transdermal Q24H   mirabegron ER  50 mg Oral Daily   montelukast  10 mg Oral QHS   nebivolol  10 mg Oral Daily   rivaroxaban  15 mg Oral Q supper   sodium chloride flush  3 mL Intravenous Q12H   Continuous Infusions:  PRN Meds: acetaminophen, albuterol, carisoprodol, cloNIDine, ketorolac, LORazepam, metoprolol tartrate, oxyCODONE, sodium chloride flush, trimethobenzamide, zolpidem   Vital Signs    Vitals:   04/25/22 1330 04/25/22 1400 04/25/22 1622 04/26/22 0553  BP: 103/66 112/62 137/65 (!) 169/89  Pulse: (!) 53 (!) 52 (!) 53   Resp: 16 16 17  (!) 21  Temp:   99.8 F (37.7 C) 98.5 F (36.9 C)  TempSrc:   Oral Oral  SpO2: 99% 100% 99% 90%  Weight:      Height:        Intake/Output Summary (Last 24 hours) at 04/26/2022 0747 Last data filed at 04/26/2022 0500 Gross per 24 hour  Intake 296.98 ml  Output 150 ml  Net 146.98 ml   Filed Weights   04/23/22 1252  Weight: 54.5 kg    Physical Exam    GEN- The patient is well appearing, alert and oriented x 3 today.   Head- normocephalic, atraumatic Eyes-  Sclera clear, conjunctiva pink Ears- hearing intact Oropharynx- clear Neck- supple Lungs- Clear to ausculation bilaterally, normal work of breathing Heart- Regular rate and rhythm, no murmurs, rubs or gallops GI- soft, NT, ND, + BS Extremities- no clubbing or cyanosis. No edema Skin- no rash or lesion Psych- euthymic mood, full affect Neuro- strength and sensation  are intact  Labs    CBC No results for input(s): "WBC", "NEUTROABS", "HGB", "HCT", "MCV", "PLT" in the last 72 hours. Basic Metabolic Panel Recent Labs    04/24/22 1458 04/24/22 1938 04/25/22 0116 04/26/22 0040  NA 135   < > 131* 130*  K 4.6   < > 4.1 4.8  CL 98   < > 96* 99  CO2 20*   < > 22 24  GLUCOSE 165*   < > 143* 118*  BUN 15   < > 12 18  CREATININE 1.06*   < > 0.85 1.28*  CALCIUM 9.6   < > 8.9 9.3  MG 1.7   < > 2.6* 2.1  PHOS 2.4*  --   --   --    < > = values in this interval not displayed.   Liver Function Tests No results for input(s): "AST", "ALT", "ALKPHOS", "BILITOT", "PROT", "ALBUMIN" in the last 72 hours. No results for input(s): "LIPASE", "AMYLASE" in the last 72 hours. Cardiac Enzymes No results for input(s): "CKTOTAL", "CKMB", "CKMBINDEX", "TROPONINI" in the last 72 hours.   Telemetry    SB/NSR 50-60s mostly. Short runs of AT (personally reviewed)  Radiology    DG CHEST PORT 1 VIEW  Result Date: 04/24/2022 CLINICAL DATA:  Ventricular tachycardia, post CPR. EXAM: PORTABLE CHEST  1 VIEW COMPARISON:  Chest radiograph 08/06/2021 FINDINGS: AP view of the chest demonstrates cardiac pads overlying the chest. Electronic device overlying the left chest may represent a loop recorder. Old left rib fractures. Negative for pneumothorax. Heart and mediastinum are within normal limits. No significant airspace disease or pulmonary edema. Few densities at the medial right lung base are likely chronic. Old posttraumatic changes involving the lateral right clavicle. IMPRESSION: No acute chest findings. Electronically Signed   By: Markus Daft M.D.   On: 04/24/2022 15:27    Patient Profile     Marissa [name] is a 76 y.o. female with a past medical history significant for persistent atrial fibrillation.  She was admitted for tikosyn load.    On 6/28 tikosyn was held due to QT prolongation. She ended up having several episodes of torsades and VF requiring brief CPR and moved  to the ICD  Assessment & Plan    Torsades de pointes / VF / PMVT Cardiac arrest In the setting of tikosyn use.  QT improved off.  K 4.8, Mg 2.1. Keep K > 4.0 and Mg > 2.0  CXR with no rib fractures from CPR   3. Persistent atrial fibrillation She is currently in NSR form initial tikosyn load, will very likely return to AF. Brief AT overnight.  She has baseline bradycardia and is not a good flecainide candidate.  Will have to pursue rate control alone. Ultimately may end up need AVJ ablation and pacing.   4. Delirium/confusion Some cognitive impairment at baseline Up most of the night.  Hopefully will improve as family arrives [place]. No fever.  Will ask PT to eval and treat to help with disposition.    For questions or updates, please contact [place] Please consult [email or web address] for contact info under Cardiology/STEMI.  Signed, Shirley Friar, PA-C  04/26/2022, 7:47 AM

## 2022-04-26 NOTE — Telephone Encounter (Signed)
Jonni Sanger made aware

## 2022-04-27 DIAGNOSIS — I4819 Other persistent atrial fibrillation: Secondary | ICD-10-CM | POA: Diagnosis not present

## 2022-04-27 DIAGNOSIS — F05 Delirium due to known physiological condition: Secondary | ICD-10-CM

## 2022-04-27 LAB — BASIC METABOLIC PANEL
Anion gap: 13 (ref 5–15)
BUN: 24 mg/dL — ABNORMAL HIGH (ref 8–23)
CO2: 21 mmol/L — ABNORMAL LOW (ref 22–32)
Calcium: 9.3 mg/dL (ref 8.9–10.3)
Chloride: 98 mmol/L (ref 98–111)
Creatinine, Ser: 1.09 mg/dL — ABNORMAL HIGH (ref 0.44–1.00)
GFR, Estimated: 52 mL/min — ABNORMAL LOW (ref >60)
Glucose, Bld: 113 mg/dL — ABNORMAL HIGH (ref 70–99)
Potassium: 4.7 mmol/L (ref 3.5–5.1)
Sodium: 132 mmol/L — ABNORMAL LOW (ref 135–145)

## 2022-04-27 LAB — MAGNESIUM: Magnesium: 1.7 mg/dL (ref 1.7–2.4)

## 2022-04-27 LAB — CBC WITH DIFFERENTIAL/PLATELET
Abs Immature Granulocytes: 0.03 10*3/uL (ref 0.00–0.07)
Basophils Absolute: 0 10*3/uL (ref 0.0–0.1)
Basophils Relative: 1 %
Eosinophils Absolute: 0.1 10*3/uL (ref 0.0–0.5)
Eosinophils Relative: 2 %
HCT: 28 % — ABNORMAL LOW (ref 36.0–46.0)
Hemoglobin: 9.7 g/dL — ABNORMAL LOW (ref 12.0–15.0)
Immature Granulocytes: 1 %
Lymphocytes Relative: 11 %
Lymphs Abs: 0.7 10*3/uL (ref 0.7–4.0)
MCH: 32.6 pg (ref 26.0–34.0)
MCHC: 34.6 g/dL (ref 30.0–36.0)
MCV: 94 fL (ref 80.0–100.0)
Monocytes Absolute: 0.8 10*3/uL (ref 0.1–1.0)
Monocytes Relative: 12 %
Neutro Abs: 4.9 10*3/uL (ref 1.7–7.7)
Neutrophils Relative %: 73 %
Platelets: 199 10*3/uL (ref 150–400)
RBC: 2.98 MIL/uL — ABNORMAL LOW (ref 3.87–5.11)
RDW: 13.3 % (ref 11.5–15.5)
WBC: 6.6 10*3/uL (ref 4.0–10.5)
nRBC: 0 % (ref 0.0–0.2)

## 2022-04-27 LAB — IRON AND TIBC
Iron: 18 ug/dL — ABNORMAL LOW (ref 28–170)
Saturation Ratios: 5 % — ABNORMAL LOW (ref 10.4–31.8)
TIBC: 382 ug/dL (ref 250–450)
UIBC: 364 ug/dL

## 2022-04-27 LAB — RETICULOCYTES
Immature Retic Fract: 22.4 % — ABNORMAL HIGH (ref 2.3–15.9)
RBC.: 2.99 MIL/uL — ABNORMAL LOW (ref 3.87–5.11)
Retic Count, Absolute: 94.2 10*3/uL (ref 19.0–186.0)
Retic Ct Pct: 3.2 % — ABNORMAL HIGH (ref 0.4–3.1)

## 2022-04-27 LAB — FERRITIN: Ferritin: 142 ng/mL (ref 11–307)

## 2022-04-27 MED ORDER — MELATONIN 5 MG PO TABS
5.0000 mg | ORAL_TABLET | Freq: Every day | ORAL | Status: DC
  Administered 2022-04-27 – 2022-05-01 (×5): 5 mg via ORAL
  Filled 2022-04-27 (×5): qty 1

## 2022-04-27 MED ORDER — MAGNESIUM SULFATE 2 GM/50ML IV SOLN
2.0000 g | Freq: Once | INTRAVENOUS | Status: AC
Start: 2022-04-27 — End: 2022-04-27
  Administered 2022-04-27: 2 g via INTRAVENOUS
  Filled 2022-04-27: qty 50

## 2022-04-27 MED ORDER — LORAZEPAM 2 MG/ML IJ SOLN: 1.0000 mg | Freq: Once | INTRAMUSCULAR | Status: DC

## 2022-04-27 MED ORDER — TRAZODONE HCL 100 MG PO TABS
100.0000 mg | ORAL_TABLET | Freq: Every day | ORAL | Status: DC
  Administered 2022-04-27 – 2022-04-28 (×2): 100 mg via ORAL
  Filled 2022-04-27 (×2): qty 1

## 2022-04-27 NOTE — Evaluation (Signed)
Physical Therapy Evaluation Patient Details Name: Marissa [name] MRN: [id number] DOB: 1946-03-17 Today's Date: 04/27/2022  History of Present Illness  76 y.o. female presents to [facility] hospital on 04/23/2022 with persistent afibm for dofetilide admission. Pt with episode of torsades on 6/28 requiring 2 minutes of CPR. Pt developed delirium during admission. PMH includes HTN, aortic stenosis, HLD, CVA.  Clinical Impression  Pt presents to PT with deficits in cognition, gait, balance, strength, power, safety awareness. Pt is confused throughout session, with expressive difficulties and nonsensical speech. Pt requires significant assistance to perform all functional mobility tasks due to imbalance and weakness. Pt with a tendency for posterior lean. Pt will benefit from aggressive mobilization in an effort to reduce falls risk and to restore her prior level of function. PT recommends AIR admission if medical diagnosis is appropriate.     Recommendations for follow up therapy are one component of a multi-disciplinary discharge planning process, led by the attending physician.  Recommendations may be updated based on patient status, additional functional criteria and insurance authorization.  Follow Up Recommendations Acute inpatient rehab (3hours/day)      Assistance Recommended at Discharge Frequent or constant Supervision/Assistance  Patient can return home with the following  Two people to help with walking and/or transfers;A lot of help with bathing/dressing/bathroom;Assistance with cooking/housework;Assistance with feeding;Direct supervision/assist for medications management;Direct supervision/assist for financial management;Assist for transportation;Help with stairs or ramp for entrance    Equipment Recommendations  (TBD pending progress)  Recommendations for Other Services  Rehab consult    Functional Status Assessment Patient has had a recent decline in their functional status and  demonstrates the ability to make significant improvements in function in a reasonable and predictable amount of time.     Precautions / Restrictions Precautions Precautions: Fall Restrictions Weight Bearing Restrictions: No      Mobility  Bed Mobility Overal bed mobility: Needs Assistance Bed Mobility: Supine to Sit, Sit to Supine     Supine to sit: Mod assist, HOB elevated Sit to supine: Total assist        Transfers Overall transfer level: Needs assistance Equipment used: 2 person hand held assist Transfers: Sit to/from Stand Sit to Stand: Mod assist, +2 physical assistance           General transfer comment: bilateral hand hold, posterior lean. tactile cues to facilitate anterior lean    Ambulation/Gait Ambulation/Gait assistance: Min assist, +2 physical assistance Gait Distance (Feet): 40 Feet Assistive device: 2 person hand held assist Gait Pattern/deviations: Step-to pattern, Shuffle Gait velocity: reduced Gait velocity interpretation: <1.31 ft/sec, indicative of household ambulator   General Gait Details: pt with slowed shuffling steps, reduced foot clearance, increased lateral and posterior postural sway requiring assist to maintain balance  Stairs            Wheelchair Mobility    Modified [place] (Stroke Patients Only)       Balance Overall balance assessment: Needs assistance Sitting-balance support: Single extremity supported, Bilateral upper extremity supported, Feet supported Sitting balance-Leahy Scale: Poor Sitting balance - Comments: min-modA Postural control: Posterior lean Standing balance support: Bilateral upper extremity supported Standing balance-Leahy Scale: Poor Standing balance comment: minA x 2                             Pertinent Vitals/Pain Pain Assessment Pain Assessment: Faces Faces Pain Scale: Hurts even more Pain Location: chest Pain Descriptors / Indicators: Aching, Moaning Pain Intervention(s):  Monitored during  session    Home Living Family/patient expects to be discharged to:: Private residence Living Arrangements: Spouse/significant other Available Help at Discharge: Family;Available 24 hours/day Type of Home: House Home Access: Stairs to enter Entrance Stairs-Rails: Left Entrance Stairs-Number of Steps: 3   Home Layout: Two level;Able to live on main level with bedroom/bathroom Home Equipment: Rolling Walker (2 wheels);Cane - single point;Shower seat;Hand held shower head      Prior Function Prior Level of Function : History of Falls (last six months);Independent/Modified Independent             Mobility Comments: ambulating independently with use of rollator       Hand Dominance   Dominant Hand: Right    Extremity/Trunk Assessment   Upper Extremity Assessment Upper Extremity Assessment: Generalized weakness    Lower Extremity Assessment Lower Extremity Assessment: Generalized weakness    Cervical / Trunk Assessment Cervical / Trunk Assessment: Kyphotic  Communication   Communication: Expressive difficulties (tangential speech, multiple random thoughts connected together)  Cognition Arousal/Alertness: Awake/alert Behavior During Therapy: Restless, Impulsive Overall Cognitive Status: Impaired/Different from baseline Area of Impairment: Orientation, Attention, Memory, Following commands, Safety/judgement, Awareness, Problem solving                 Orientation Level: Disoriented to, Place, Time, Situation Current Attention Level: Focused Memory: Decreased recall of precautions, Decreased short-term memory Following Commands: Follows one step commands with increased time, Follows multi-step commands inconsistently Safety/Judgement: Decreased awareness of safety, Decreased awareness of deficits Awareness: Intellectual Problem Solving: Difficulty sequencing, Requires verbal cues, Requires tactile cues          General Comments General  comments (skin integrity, edema, etc.): VSS on RA    Exercises     Assessment/Plan    PT Assessment Patient needs continued PT services  PT Problem List Decreased strength;Decreased balance;Decreased activity tolerance;Decreased mobility;Decreased cognition;Decreased knowledge of use of DME;Decreased knowledge of precautions;Decreased safety awareness;Cardiopulmonary status limiting activity;Pain       PT Treatment Interventions DME instruction;Gait training;Stair training;Functional mobility training;Therapeutic activities;Therapeutic exercise;Balance training;Neuromuscular re-education;Cognitive remediation;Patient/family education    PT Goals (Current goals can be found in the Care Plan section)  Acute Rehab PT Goals Patient Stated Goal: to return to prior level of function PT Goal Formulation: With patient/family Time For Goal Achievement: 05/11/22 Potential to Achieve Goals: Fair    Frequency Min 3X/week     Co-evaluation               AM-PAC PT "6 Clicks" Mobility  Outcome Measure Help needed turning from your back to your side while in a flat bed without using bedrails?: A Lot Help needed moving from lying on your back to sitting on the side of a flat bed without using bedrails?: A Lot Help needed moving to and from a bed to a chair (including a wheelchair)?: A Lot Help needed standing up from a chair using your arms (e.g., wheelchair or bedside chair)?: A Lot Help needed to walk in hospital room?: A Lot Help needed climbing 3-5 steps with a railing? : Total 6 Click Score: 11    End of Session   Activity Tolerance: Patient tolerated treatment well Patient left: in bed;with call bell/phone within reach;with bed alarm set;with family/visitor present Nurse Communication: Mobility status PT Visit Diagnosis: Other abnormalities of gait and mobility (R26.89);Muscle weakness (generalized) (M62.81);Other symptoms and signs involving the nervous system (R29.898)     Time: 4680-3212 PT Time Calculation (min) (ACUTE ONLY): 32 min   Charges:   PT  Evaluation $PT Eval Moderate Complexity: 1 Mod          Zenaida Niece, PT, DPT Acute Rehabilitation Office [phone]   Zenaida Niece 04/27/2022, 3:52 PM

## 2022-04-27 NOTE — Progress Notes (Signed)
On Call Cards MD paged due to agitation and restlessness. I've administered PO oxycodone and PO ativan. Daughter at bedside requesting "something stronger" she states patient did not sleep any night before last.  Mayl, Pearletha Forge, MD ordered 49m Ativan IV once.

## 2022-04-27 NOTE — Progress Notes (Signed)
Called to see pt because of agitation with complaints of pain and ongoing delerium  Intially family thought was improving; this afternoon increasingly agitated with complaints of pain related to her chest,?  Related to CPR, and continuing to confabulate.  I have reached out to neurology.  Initial advice was to use trazodone for sleep, benzos only as previously prescribed, out of bed during the day, and a stat thiamine level and thiamine repletion given the confabulation although I suspect it is related to the delirium and not to thiamine deficiency as its acute.  They also recommended an MRI, however, with her agitation I do not think the best possible

## 2022-04-27 NOTE — Progress Notes (Signed)
Patient Name: Marissa [name]      SUBJECTIVE:admitted for dofetilide initiation 6/27  cx by Tdp 6/28  Rx  with Mg; also post event delerium Still delirious  but better according to her husband She has hx of stroke and cognitive impairment and got benzo last night for sleep    Past Medical History:  Diagnosis Date   Arthritis    Asthma    Bicuspid aortic valve 05/2017   Likely functional bicuspid aortic valve with sclerosis and no stenosis.   Bursitis    Cataract    mild   Dysthymia    Hemorrhoids    Hx of ulcerative colitis    per dr Arnoldo Morale as per pt.   Hyperlipidemia    Labile hypertension    managed - labile.   Migraine    Scoliosis    Slurred speech    temporal lobe area that is not a tumor causes occ slurred speech and inability to communicate/ words will not come out at the correct time   Spinal stenosis    Stroke [facility]) 08/06/2021   Thoracic aortic aneurysm (HCC)    Stable 4.2-4.3 cm (followed by Dr. Roxan Hockey)   Thyroid disease     Scheduled Meds:  Scheduled Meds:  acetaminophen  500 mg Oral Q6H   FLUoxetine  40 mg Oral Daily   hydrocortisone cream   Topical TID   levothyroxine  125 mcg Oral Q0600   mirabegron ER  50 mg Oral Daily   montelukast  10 mg Oral QHS   nebivolol  10 mg Oral Daily   rivaroxaban  15 mg Oral Q supper   sodium chloride flush  3 mL Intravenous Q12H   Continuous Infusions: acetaminophen, albuterol, carisoprodol, cloNIDine, ketorolac, LORazepam, metoprolol tartrate, oxyCODONE, sodium chloride flush, trimethobenzamide, zolpidem    PHYSICAL EXAM Vitals:   04/26/22 2012 04/27/22 0027 04/27/22 0429 04/27/22 0751  BP: 139/88 123/66 (!) 157/69 131/82  Pulse: 98 (!) 56 (!) 51 (!) 59  Resp: 12 18 14 17   Temp: 99.1 F (37.3 C) (!) 96.6 F (35.9 C) (!) 96 F (35.6 C) 97.7 F (36.5 C)  TempSrc: Axillary Axillary Oral Oral  SpO2: 90% 95% 96% 99%  Weight:      Height:        Well developed and nourished in no acute  distress HENT normal Neck supple with JVP-  flat *** Clear Regular rate and rhythm, no murmurs or gallops Abd-soft with active BS No Clubbing cyanosis edema Skin-warm and dry A & disoriented Grossly normal sensory and motor function  ECG pending    TELEMETRY: Reviewed personnally pt in sinus with QT about460 occ PACs :  ECG personally reviewed non recently    Intake/Output Summary (Last 24 hours) at 04/27/2022 1114 Last data filed at 04/27/2022 0840 Gross per 24 hour  Intake 445 ml  Output 1350 ml  Net -905 ml    LABS: Basic Metabolic Panel: Recent Labs  Lab 04/23/22 1142 04/23/22 2025 04/24/22 0146 04/24/22 1458 04/24/22 1938 04/25/22 0116 04/26/22 0040 04/27/22 0726  NA 136  --  133* 135 130* 131* 130* 132*  K 3.6  --  3.9 4.6 5.0 4.1 4.8 4.7  CL 99  --  102 98 98 96* 99 98  CO2 19*  --  23 20* 19* 22 24 21*  GLUCOSE 101*  --  99 165* 129* 143* 118* 113*  BUN 15  --  18 15 15  12  18 24*  CREATININE 1.02*  --  0.87 1.06* 0.89 0.85 1.28* 1.09*  CALCIUM 9.3  --  8.7* 9.6 9.1 8.9 9.3 9.3  MG 1.3*   < > 2.2 1.7 2.7* 2.6* 2.1 1.7  PHOS  --   --   --  2.4*  --   --   --   --    < > = values in this interval not displayed.   Cardiac Enzymes: No results for input(s): "CKTOTAL", "CKMB", "CKMBINDEX", "TROPONINI" in the last 72 hours. CBC: Recent Labs  Lab 04/27/22 0726  WBC 6.6  NEUTROABS 4.9  HGB 9.7*  HCT 28.0*  MCV 94.0  PLT 199        ASSESSMENT AND PLAN:  Principal Problem:   Persistent atrial fibrillation ([place]): CHA2DS2-VASc Score = 7  Active Problems:   Drug-induced torsades de pointes ([place])   Cardiac arrest ([place])   Delirium due to another medical condition, acute, hyperactive Anemia acute chronic  hyponatremia  Holding sinus Delirium better but still impairing, multifactorial with prior stroke cognitive issues and agitation on chronic benzo; sodium is not low enough to be contributing   Will follow for now as improving otherwise will request  help from either neuro or IM   Need repeat ECG  Follow sodium      Signed, Virl Axe MD  04/27/2022

## 2022-04-27 NOTE — Progress Notes (Signed)
Patient has slept throughout the night after the IV ativan dose. Marissa [name] has not voided this shift. Bladder scan revealed >999. I&O Cath volume = 1359m

## 2022-04-28 ENCOUNTER — Inpatient Hospital Stay (HOSPITAL_COMMUNITY): Payer: Medicare Other

## 2022-04-28 DIAGNOSIS — I4819 Other persistent atrial fibrillation: Secondary | ICD-10-CM | POA: Diagnosis not present

## 2022-04-28 LAB — BASIC METABOLIC PANEL
Anion gap: 11 (ref 5–15)
BUN: 29 mg/dL — ABNORMAL HIGH (ref 8–23)
CO2: 21 mmol/L — ABNORMAL LOW (ref 22–32)
Calcium: 9.2 mg/dL (ref 8.9–10.3)
Chloride: 99 mmol/L (ref 98–111)
Creatinine, Ser: 1.05 mg/dL — ABNORMAL HIGH (ref 0.44–1.00)
GFR, Estimated: 55 mL/min — ABNORMAL LOW (ref >60)
Glucose, Bld: 118 mg/dL — ABNORMAL HIGH (ref 70–99)
Potassium: 4.5 mmol/L (ref 3.5–5.1)
Sodium: 131 mmol/L — ABNORMAL LOW (ref 135–145)

## 2022-04-28 LAB — TROPONIN I (HIGH SENSITIVITY): Troponin I (High Sensitivity): 10 ng/L (ref <18)

## 2022-04-28 LAB — MAGNESIUM: Magnesium: 1.7 mg/dL (ref 1.7–2.4)

## 2022-04-28 MED ORDER — MAGNESIUM SULFATE 4 GM/100ML IV SOLN
4.0000 g | Freq: Once | INTRAVENOUS | Status: AC
  Administered 2022-04-28: 4 g via INTRAVENOUS
  Filled 2022-04-28: qty 100

## 2022-04-28 NOTE — NC FL2 (Signed)
[place] LEVEL OF CARE SCREENING TOOL     IDENTIFICATION  Patient Name: Marissa [name] Birthdate: 1946-03-27 Sex: female Admission Date (Current Location): 04/23/2022  [facility] and [place] Number:  Herbalist and Address:  [facility], [place] [address], [place], [place] [address]      Provider Number: [id number]  Attending Physician Name and Address:  [name], MD  Relative Name and Phone Number:  [place], [place] [phone]    Current Level of Care: Hospital Recommended Level of Care: [place] Prior Approval Number:    Date Approved/Denied:   PASRR Number: [id number]  Discharge Plan: Home    Current Diagnoses: Patient Active Problem List   Diagnosis Date Noted   Delirium due to another medical condition, acute, hyperactive 04/25/2022   Drug-induced torsades de pointes ([place]) 04/24/2022   Cardiac arrest ([place]) 04/24/2022   Ataxia 03/04/2022   Aortic atherosclerosis ([place]) 12/29/2021   Costochondritis 12/26/2021   Adverse effect of drug 12/26/2021   Persistent atrial fibrillation ([place]): CHA2DS2-VASc Score = 7  08/31/2021   Secondary hypercoagulable state ([place]) 08/31/2021   Solitary pulmonary nodule 08/17/2021   Fall at home, initial encounter 26/83/4196   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[place]) 22/29/7989   Chronic cough 06/01/2021   Memory loss 07/15/2020   Malnutrition ([place]) 12/22/2018   Hypokalemia 12/22/2018   Hypomagnesemia 12/22/2018   Hypophosphatemia 12/22/2018   Anxiety    Prolonged QT interval 12/21/2018   Hyponatremia 12/21/2018   Hyperlipidemia with target LDL less than 100 10/02/2018   Encounter for chronic pain management 11/28/2017   Dyspnea on exertion 06/01/2017   Mild aortic stenosis 05/27/2017   Routine general medical examination at a health care facility 07/09/2016   Frequent falls 03/28/2016   Rosacea 08/18/2015   Lumbar radiculopathy 07/07/2015   Urinary incontinence  06/20/2015   Hemorrhoids 10/06/2014   Cough variant asthma vs UACS 08/03/2014   Labile hypertension 02/22/2014   Insomnia secondary to chronic pain 07/09/2012   B12 deficiency 04/03/2011   Unspecified vitamin D deficiency 07/25/2010   ANEMIA, IRON DEFICIENCY 04/25/2009   Allergic rhinitis 02/01/2009   Mild dilation of ascending aorta (HCC) 12/27/2008   Chronic fatigue syndrome 04/21/2008   Major depression in partial remission ([place]) 01/19/2008   Osteoarthritis 01/19/2008   Hypothyroidism 03/13/2007   Asthma 03/13/2007   Fibromyalgia 03/13/2007    Orientation RESPIRATION BLADDER Height & Weight      (waxes.wanes)  Normal Continent Weight: 120 lb 1.6 oz (54.5 kg) Height:  5' 4"  (162.6 cm)  BEHAVIORAL SYMPTOMS/MOOD NEUROLOGICAL BOWEL NUTRITION STATUS      Continent Diet (heart healthy, thin liquid)  AMBULATORY STATUS COMMUNICATION OF NEEDS Skin   Extensive Assist Verbally Normal                       Personal Care Assistance Level of Assistance  Feeding, Dressing, Bathing Bathing Assistance: Maximum assistance Feeding assistance: Limited assistance Dressing Assistance: Maximum assistance     Functional Limitations Info             SPECIAL CARE FACTORS FREQUENCY  PT (By licensed PT), OT (By licensed OT)     PT Frequency: 5x weekly OT Frequency: 5x weekly            Contractures Contractures Info: Not present    Additional Factors Info  Allergies, Code Status Code Status Info: full Allergies Info: Eleven listed, see documentation/AVS/DCsummary  Current Medications (04/28/2022):  This is the current hospital active medication list Current Facility-Administered Medications  Medication Dose Route Frequency Provider Last Rate Last Admin   acetaminophen (TYLENOL) tablet 500 mg  500 mg Oral Q6H Shirley Friar, PA-C   500 mg at 04/28/22 1611   acetaminophen (TYLENOL) tablet 650 mg  650 mg Oral Q6H PRN Shirley Friar, PA-C   650 mg  at 04/27/22 0534   albuterol (PROVENTIL) (2.5 MG/3ML) 0.083% nebulizer solution 3 mL  3 mL Inhalation Q6H PRN Shirley Friar, PA-C       carisoprodol (SOMA) tablet 350 mg  350 mg Oral TID PRN Shirley Friar, PA-C   350 mg at 04/27/22 1832   cloNIDine (CATAPRES) tablet 0.2 mg  0.2 mg Oral BID PRN Shirley Friar, PA-C   0.2 mg at 04/24/22 1838   FLUoxetine (PROZAC) capsule 40 mg  40 mg Oral Daily Shirley Friar, PA-C   40 mg at 04/28/22 1230   hydrocortisone cream 1 %   Topical TID Shirley Friar, PA-C   Given at 04/28/22 1611   ketorolac (TORADOL) 15 MG/ML injection 15 mg  15 mg Intravenous Q8H PRN Shirley Friar, PA-C   15 mg at 04/28/22 6153   levothyroxine (SYNTHROID) tablet 125 mcg  125 mcg Oral Q0600 Shirley Friar, PA-C   125 mcg at 04/28/22 0510   LORazepam (ATIVAN) tablet 0.5 mg  0.5 mg Oral BID PRN Shirley Friar, PA-C   0.5 mg at 04/27/22 7943   melatonin tablet 5 mg  5 mg Oral QHS Deboraha Sprang, MD   5 mg at 04/27/22 2036   metoprolol tartrate (LOPRESSOR) tablet 25 mg  25 mg Oral BID PRN Shirley Friar, PA-C       montelukast (SINGULAIR) tablet 10 mg  10 mg Oral QHS Shirley Friar, PA-C   10 mg at 04/27/22 2036   nebivolol (BYSTOLIC) tablet 10 mg  10 mg Oral Daily Shirley Friar, PA-C   10 mg at 04/28/22 1230   oxyCODONE (Oxy IR/ROXICODONE) immediate release tablet 5 mg  5 mg Oral Q4H PRN Rudean Curt, MD   5 mg at 04/28/22 2761   Rivaroxaban (XARELTO) tablet 15 mg  15 mg Oral Q supper Shirley Friar, PA-C   15 mg at 04/28/22 1611   sodium chloride flush (NS) 0.9 % injection 3 mL  3 mL Intravenous Q12H Shirley Friar, PA-C   3 mL at 04/28/22 1145   sodium chloride flush (NS) 0.9 % injection 3 mL  3 mL Intravenous PRN Shirley Friar, PA-C       traZODone (DESYREL) tablet 100 mg  100 mg Oral QHS Deboraha Sprang, MD   100 mg at 04/27/22 2037    trimethobenzamide (TIGAN) injection 200 mg  200 mg Intramuscular Q8H PRN Shirley Friar, PA-C       zolpidem (AMBIEN) tablet 5 mg  5 mg Oral QHS PRN Shirley Friar, PA-C   5 mg at 04/23/22 2342     Discharge Medications: Please see discharge summary for a list of discharge medications.  Relevant Imaging Results:  Relevant Lab Results:   Additional Information SSN: [id number]  Alfredia Ferguson, LCSW

## 2022-04-28 NOTE — Progress Notes (Signed)
Patient Name: Marissa [name]      SUBJECTIVE:admitted for dofetilide initiation 6/27  cx by Tdp 6/28  Rx  with Mg; also post event delerium   She has hx of stroke and cognitive impairment    Remains delirious but now sleeping Less bothered by pain last night   Past Medical History:  Diagnosis Date   Arthritis    Asthma    Bicuspid aortic valve 05/2017   Likely functional bicuspid aortic valve with sclerosis and no stenosis.   Bursitis    Cataract    mild   Dysthymia    Hemorrhoids    Hx of ulcerative colitis    per dr Arnoldo Morale as per pt.   Hyperlipidemia    Labile hypertension    managed - labile.   Migraine    Scoliosis    Slurred speech    temporal lobe area that is not a tumor causes occ slurred speech and inability to communicate/ words will not come out at the correct time   Spinal stenosis    Stroke [facility]) 08/06/2021   Thoracic aortic aneurysm (HCC)    Stable 4.2-4.3 cm (followed by Dr. Roxan Hockey)   Thyroid disease     Scheduled Meds:  Scheduled Meds:  acetaminophen  500 mg Oral Q6H   FLUoxetine  40 mg Oral Daily   hydrocortisone cream   Topical TID   levothyroxine  125 mcg Oral Q0600   melatonin  5 mg Oral QHS   montelukast  10 mg Oral QHS   nebivolol  10 mg Oral Daily   rivaroxaban  15 mg Oral Q supper   sodium chloride flush  3 mL Intravenous Q12H   traZODone  100 mg Oral QHS   Continuous Infusions:  magnesium sulfate bolus IVPB 4 g (04/28/22 1145)   acetaminophen, albuterol, carisoprodol, cloNIDine, ketorolac, LORazepam, metoprolol tartrate, oxyCODONE, sodium chloride flush, trimethobenzamide, zolpidem    PHYSICAL EXAM Vitals:   04/28/22 0501 04/28/22 0530 04/28/22 0600 04/28/22 0834  BP: 116/80 116/80    Pulse: 65     Resp: (!) 24 (!) 28 16 18   Temp: (!) 97.5 F (36.4 C)     TempSrc:      SpO2:    99%  Weight:      Height:        Well developed and nourished in no acute distress HENT normal Neck supple with JVP-  flat    Clear Regular rate and rhythm, no murmurs or gallops Abd-soft with active BS No Clubbing cyanosis edema Skin-warm and dry A & disoriented Grossly normal sensory and motor function  ECG  7/1 QT I think is about 460-480 (I,II,III) with very prominent u waves causing the computer reading of >560 msec TWave inversions new-- anterior precordium with some degree of asymmetry     TELEMETRY: Reviewed personnally pt in sinus with QT about460 occ PACs :  ECG personally reviewed non recently    Intake/Output Summary (Last 24 hours) at 04/28/2022 1238 Last data filed at 04/28/2022 0834 Gross per 24 hour  Intake 160 ml  Output --  Net 160 ml     LABS: Basic Metabolic Panel: Recent Labs  Lab 04/24/22 0146 04/24/22 1458 04/24/22 1938 04/25/22 0116 04/26/22 0040 04/27/22 0726 04/28/22 0721  NA 133* 135 130* 131* 130* 132* 131*  K 3.9 4.6 5.0 4.1 4.8 4.7 4.5  CL 102 98 98 96* 99 98 99  CO2 23 20* 19* 22 24 21*  21*  GLUCOSE 99 165* 129* 143* 118* 113* 118*  BUN 18 15 15 12 18  24* 29*  CREATININE 0.87 1.06* 0.89 0.85 1.28* 1.09* 1.05*  CALCIUM 8.7* 9.6 9.1 8.9 9.3 9.3 9.2  MG 2.2 1.7 2.7* 2.6* 2.1 1.7 1.7  PHOS  --  2.4*  --   --   --   --   --     Cardiac Enzymes: No results for input(s): "CKTOTAL", "CKMB", "CKMBINDEX", "TROPONINI" in the last 72 hours. CBC: Recent Labs  Lab 04/27/22 0726  WBC 6.6  NEUTROABS 4.9  HGB 9.7*  HCT 28.0*  MCV 94.0  PLT 199         ASSESSMENT AND PLAN:  Principal Problem:   Persistent atrial fibrillation ([place]): CHA2DS2-VASc Score = 7  Active Problems:   Drug-induced torsades de pointes (HCC)   Cardiac arrest ([place])   Delirium due to another medical condition, acute, hyperactive Anemia acute chronic  Hyponatremia Abnormal ECG with QT prolongation   Holding sinus,  Delirium still an issue but is sleeping this am, so hard to know If still an issue in am, would call IM for assistance OOB to chair today  As relates to her ECG, T  wave inversions are striking, we will check a troponin, the other concern is CNS.  I will try to do a head CT without contrast looking for bleeding.  As noted yesterday, I do not think there is any way she could be stable for MRI  Appreciate magnesium repletion.,  K is stable.  Repeat ECG.        Signed, Virl Axe MD  04/28/2022

## 2022-04-28 NOTE — Progress Notes (Signed)
Inpatient Rehab Admissions Coordinator Note:   Per PT patient was screened for CIR candidacy by Elmon Shader Danford Bad, CCC-SLP. At this time, pt appears to be a potential candidate for CIR. I will place an order for rehab consult for full assessment, per our protocol.  Please contact me any with questions.Gayland Curry, [place], [place] Admissions Coordinator [phone] 04/28/22 8:16 AM

## 2022-04-28 NOTE — TOC Initial Note (Addendum)
Transition of Care [facility]) - Initial/Assessment Note    Patient Details  Name: Marissa [name] MRN: [id number] Date of Birth: January 14, 1946  [facility] CM/SW Contact:    Bary Castilla, LCSW Phone Number:[phone] 04/28/2022, 4:41 PM  Clinical Narrative:                  CSW was alerted that pts family wanted to pursue a SNF in [place] preferably [place]. CSW sopke with pt's spouse and he wishes that pt be considered for [place] or SNFs near [place]. CSW discussed the SNF process and provided medicare.gov rating list to daughter Fanny Skates.Spouse explained if pt was not accepted by SNFs near [facility] then he would want pt to be reconsidered to CIR. CSW explained that pt would need to participate in an assessment for CIR.CSW answered questions and informed them that facilities would need to make bed offers.CSW received permission to fax out to facilities.   TOC team will continue to assist with discharge planning needs.  Expected Discharge Plan: Skilled Nursing Facility Barriers to Discharge: SNF Pending bed offer, Continued Medical Work up, Ship broker   Patient Goals and CMS Choice   CMS Medicare.gov Compare Post Acute Care list provided to:: Patient Choice offered to / list presented to : Patient  Expected Discharge Plan and Services Expected Discharge Plan: [place] arrangements for the past 2 months: Single Family Home                                      Prior Living Arrangements/Services Living arrangements for the past 2 months: Single Family Home Lives with:: Self, Spouse Patient language and need for interpreter reviewed:: Yes          Care giver support system in place?: Yes (comment)      Activities of Daily Living Home Assistive Devices/Equipment: Cane (specify quad or straight), Walker (specify type), Eyeglasses ADL Screening (condition at time of admission) Patient's cognitive ability  adequate to safely complete daily activities?: Yes Is the patient deaf or have difficulty hearing?: No Does the patient have difficulty seeing, even when wearing glasses/contacts?: No Does the patient have difficulty concentrating, remembering, or making decisions?: No Patient able to express need for assistance with ADLs?: Yes Does the patient have difficulty dressing or bathing?: No Independently performs ADLs?: Yes (appropriate for developmental age) Does the patient have difficulty walking or climbing stairs?: No Weakness of Legs: None Weakness of Arms/Hands: None  Permission Sought/Granted   Permission granted to share information with : Yes, Verbal Permission Granted  Share Information with NAME: Christean Grief  Permission granted to share info w AGENCY: SNFs  Permission granted to share info w Relationship: Spouse  Permission granted to share info w Contact Information: [phone]  Emotional Assessment Appearance:: Appears stated age   Affect (typically observed): Accepting Orientation: : Oriented to Self, Oriented to Place      Admission diagnosis:  Persistent atrial fibrillation [facility]) [I48.19] Patient Active Problem List   Diagnosis Date Noted   Delirium due to another medical condition, acute, hyperactive 04/25/2022   Drug-induced torsades de pointes ([place]) 04/24/2022   Cardiac arrest ([place]) 04/24/2022   Ataxia 03/04/2022   Aortic atherosclerosis ([place]) 12/29/2021   Costochondritis 12/26/2021   Adverse effect of drug 12/26/2021   Persistent atrial fibrillation ([place]): CHA2DS2-VASc Score = 7  08/31/2021  Secondary hypercoagulable state ([place]) 08/31/2021   Solitary pulmonary nodule 08/17/2021   Fall at home, initial encounter 27/74/1287   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[place]) 86/76/7209   Chronic cough 06/01/2021   Memory loss 07/15/2020   Malnutrition ([place]) 12/22/2018   Hypokalemia 12/22/2018   Hypomagnesemia 12/22/2018   Hypophosphatemia 12/22/2018   Anxiety    Prolonged QT  interval 12/21/2018   Hyponatremia 12/21/2018   Hyperlipidemia with target LDL less than 100 10/02/2018   Encounter for chronic pain management 11/28/2017   Dyspnea on exertion 06/01/2017   Mild aortic stenosis 05/27/2017   Routine general medical examination at a health care facility 07/09/2016   Frequent falls 03/28/2016   Rosacea 08/18/2015   Lumbar radiculopathy 07/07/2015   Urinary incontinence 06/20/2015   Hemorrhoids 10/06/2014   Cough variant asthma vs UACS 08/03/2014   Labile hypertension 02/22/2014   Insomnia secondary to chronic pain 07/09/2012   B12 deficiency 04/03/2011   Unspecified vitamin D deficiency 07/25/2010   ANEMIA, IRON DEFICIENCY 04/25/2009   Allergic rhinitis 02/01/2009   Mild dilation of ascending aorta (HCC) 12/27/2008   Chronic fatigue syndrome 04/21/2008   Major depression in partial remission ([place]) 01/19/2008   Osteoarthritis 01/19/2008   Hypothyroidism 03/13/2007   Asthma 03/13/2007   Fibromyalgia 03/13/2007   PCP:  [name], MD Pharmacy:   [facility] [id number] [address], [place] [address] AT [facility] [address] [place] [address] Phone: [phone] Fax: [phone]     Social Determinants of Health (SDOH) Interventions    Readmission Risk Interventions     No data to display

## 2022-04-28 NOTE — Progress Notes (Signed)
Inpatient Rehab Admissions:  Inpatient Rehab Consult received.  I met with patient and husband Christean Grief at the bedside for rehabilitation assessment and to discuss goals and expectations of an inpatient rehab admission.  Both acknowledged understanding of CIR goals and expectations. They would prefer to pursue SNF placement, specifically [facility]. TOC made aware.  Signed: Gayland Curry, [place], [place] Admissions Coordinator [phone]

## 2022-04-29 ENCOUNTER — Inpatient Hospital Stay (HOSPITAL_COMMUNITY): Payer: Medicare Other

## 2022-04-29 DIAGNOSIS — R338 Other retention of urine: Secondary | ICD-10-CM | POA: Diagnosis not present

## 2022-04-29 DIAGNOSIS — R008 Other abnormalities of heart beat: Secondary | ICD-10-CM

## 2022-04-29 DIAGNOSIS — I469 Cardiac arrest, cause unspecified: Secondary | ICD-10-CM

## 2022-04-29 DIAGNOSIS — I4819 Other persistent atrial fibrillation: Secondary | ICD-10-CM | POA: Diagnosis not present

## 2022-04-29 LAB — BASIC METABOLIC PANEL
Anion gap: 15 (ref 5–15)
BUN: 23 mg/dL (ref 8–23)
CO2: 22 mmol/L (ref 22–32)
Calcium: 9.4 mg/dL (ref 8.9–10.3)
Chloride: 95 mmol/L — ABNORMAL LOW (ref 98–111)
Creatinine, Ser: 0.98 mg/dL (ref 0.44–1.00)
GFR, Estimated: 59 mL/min — ABNORMAL LOW (ref >60)
Glucose, Bld: 99 mg/dL (ref 70–99)
Potassium: 4.6 mmol/L (ref 3.5–5.1)
Sodium: 132 mmol/L — ABNORMAL LOW (ref 135–145)

## 2022-04-29 LAB — ECHOCARDIOGRAM LIMITED
Calc EF: 58.8 %
Height: 64 in
S' Lateral: 2.4 cm
Single Plane A2C EF: 63.6 %
Single Plane A4C EF: 54.2 %
Weight: 1921.6 oz

## 2022-04-29 LAB — MAGNESIUM: Magnesium: 2 mg/dL (ref 1.7–2.4)

## 2022-04-29 MED ORDER — CHLORHEXIDINE GLUCONATE CLOTH 2 % EX PADS
6.0000 | MEDICATED_PAD | Freq: Every day | CUTANEOUS | Status: DC
Start: 2022-04-30 — End: 2022-05-02
  Administered 2022-04-30 – 2022-05-02 (×3): 6 via TOPICAL

## 2022-04-29 MED ORDER — TAMSULOSIN HCL 0.4 MG PO CAPS
0.4000 mg | ORAL_CAPSULE | Freq: Every day | ORAL | Status: DC
  Administered 2022-04-29 – 2022-05-01 (×3): 0.4 mg via ORAL
  Filled 2022-04-29 (×3): qty 1

## 2022-04-29 MED ORDER — QUETIAPINE FUMARATE 25 MG PO TABS
25.0000 mg | ORAL_TABLET | Freq: Every day | ORAL | Status: DC
  Administered 2022-04-29 – 2022-05-01 (×3): 25 mg via ORAL
  Filled 2022-04-29 (×3): qty 1

## 2022-04-29 NOTE — Assessment & Plan Note (Signed)
Continue Synthroid °

## 2022-04-29 NOTE — Assessment & Plan Note (Addendum)
-  Associated with Tikosyn induction -Regained pulse with CPR -Management per cardiology -Based on gradual return to baseline, she is likely to benefit from rehab

## 2022-04-29 NOTE — Assessment & Plan Note (Signed)
-  Possibly related to arrest and/or medications -She has consistently required I/O cath with marked retention -Will place foley -Start flomax -She will need outpatient urology f/u in a couple of weeks

## 2022-04-29 NOTE — Progress Notes (Signed)
  Echocardiogram 2D Echocardiogram has been performed.  Bobbye Charleston 04/29/2022, 12:22 PM

## 2022-04-29 NOTE — TOC Progression Note (Addendum)
Transition of Care [facility]) - Progression Note    Patient Details  Name: Nishita Isaacks MRN: [id number] Date of Birth: November 15, 1945  Transition of Care [facility]) CM/SW [place], [place] Phone Number: 04/29/2022, 3:04 PM  Clinical Narrative:      Update- CSW started insurance authorization for patient. Reference # [id number]. Insurance authorization for SNF currently pending.  CSW spoke with patients spouse and provided SNF bed offers. Patients spouse chose SNF placement with [facility]. CSW called [facility] and spoke with Whitney and confirmed SNF bed offer. Whitney informed CSW it will be $41.00 a day. CSW informed patients spouse who accepted SNF bed offer. CSW following to start insurance authorization close to patient being medically ready for dc. CSW will continue to follow.  Expected Discharge Plan: [place] Barriers to Discharge: SNF Pending bed offer, Continued Medical Work up, Ship broker  Expected Discharge Plan and Services Expected Discharge Plan: [place] arrangements for the past 2 months: Single Family Home                                       Social Determinants of Health (SDOH) Interventions    Readmission Risk Interventions     No data to display

## 2022-04-29 NOTE — Consult Note (Signed)
Initial Consultation Note   Patient: Marissa [name] JJH:[id number] DOB: 05-Aug-1946 PCP: [name], MD DOA: 04/23/2022 DOS: the patient was seen and examined on 04/29/2022 Primary service: [name], MD  Referring physician: Caryl Comes, EP Reason for consult: Needs management of delirium, dementia, urinary retention.  Admitted for Tikosyn and suffered VF arrest on 6/28.  Confusion, unsure how acute.  Head CT negative on the weekend.  QT is very long, was on trazodone and this was stopped.  I/O cath also done intermittently.    Assessment and Plan: * Cardiac arrest ([place]) -Associated with Tikosyn induction -Regained pulse with CPR -Management per cardiology -Based on gradual return to baseline, she is likely to benefit from rehab  Prolonged QT interval -Likely associated with Tikosyn response and cardiac arrest, anticipate slow resolution -Will attempt to avoid QT-prolonging medications such as PPI, nausea meds, SSRIs -This is challenging given her delirium; discussed with pharmacy and low-dose PO Seroquel with avoidance of other QT prolonging agents is likely her best bet -Repeat EKG qAM  Delirium superimposed on dementia -Patient has been quite agitated and had significant sundowning since her event -This does appear to be mildly improved now and she was back to her usual dementia baseline this AM - but may recur as night approaches -Will order delirium precautions -Would avoid BZD both because of risk of increased agitation as well as QTc prolongation -Haldol is less preferred due to QTc -Will order low-dose standing qhs Seroquel; can titrate as needed once QTc normalizes  Acute urinary retention -Possibly related to arrest and/or medications -She has consistently required I/O cath with marked retention -Will place foley -Start flomax -She will need outpatient urology f/u in a couple of weeks  Persistent atrial fibrillation ([place]): CHA2DS2-VASc Score = 7   -Continue Xarelto -Rate controlled with nebivolol, prn lopressor  Labile hypertension -Continue home nebivolol and clonidine  Hypothyroidism -Continue Synthroid  Dyslipidemia -LDL is 109, goal <100 -Management per cardiology       [facility] will continue to follow the patient.  HPI: Marissa [name] is a 76 y.o. female with past medical history of HTN, HLD, hypothyroidism, AS, afib, and CVA who presented on 6/27 for dofetilide admission.  She has had post-event delirium after Tikosyn-induced cardiac arrest.  She has had severe delirium but is actually improved today and her husband states she is at/near baseline.  She does have baseline dementia.  Post-event, she has had persistent urinary retention.  She is requiring consistent I&O cath and this AM had 1068m of retained urine.     Review of Systems: As mentioned in the history of present illness. All other systems reviewed and are negative. Past Medical History:  Diagnosis Date   Arthritis    Asthma    Bicuspid aortic valve 05/2017   Likely functional bicuspid aortic valve with sclerosis and no stenosis.   Bursitis    Cataract    mild   Dysthymia    Hemorrhoids    Hx of ulcerative colitis    per dr jArnoldo Moraleas per pt.   Hyperlipidemia    Labile hypertension    managed - labile.   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 Roxan Hockey)   Thyroid disease    Past Surgical History:  Procedure Laterality Date   ABDOMINAL HYSTERECTOMY     BREAST EXCISIONAL BIOPSY Left    BUNIONECTOMY     Cardiac Event Monitor  07/2017   Overall relatively normal.  Normal sinus rhythm with rare bradycardia and tachycardia.  Heart rate ranged from 55-110 bpm.  Occasional PACs and PVCs,  every single 1 was felt.  No arrhythmias other than one short 4 beat run of PACs.   COLONOSCOPY  02/04/2005   all normal    FOOT SURGERY     3 pins in toes    HAND SURGERY     left thumb joint resection   LOOP RECORDER INSERTION N/A 08/09/2021   Procedure: LOOP RECORDER INSERTION;  Surgeon: [name], MD;  Location: [facility];  Service: Cardiovascular;  Laterality: N/A;   nasal revision     NM MYOVIEW LTD  07/2017   LOW RISK study.  No ischemia or infarction.  EF greater than 65%.    SINUS IRRIGATION     TONSILLECTOMY AND ADENOIDECTOMY     TRANSTHORACIC ECHOCARDIOGRAM  06/'1, 8/'19   a) Moderate LVH. Normal EF 60-65%. Normal diastolic parameters. --> Difficult to fully visualize the aortic valve. Cannot exclude bicuspid valve. Mild aortic stenosis noted. No PFO. Mildly dilated left atrium. Trivial MR. No comment on mitral valve prolapse. Moderately dilated ascending aorta.;; b) F/u Echo To evaluate the aortic valve. -- Bicuspid AoV - mildly thickened / calcified. - No stenosis   TRANSTHORACIC ECHOCARDIOGRAM  08/08/2021   EF 60 to 65%.  Normal diastolic parameters?.   ?  Functional bicuspid aortic valve with R&L cusp sommisure.  Very sclerotic.  Stable gradients ~mean gradient 10 mmHg. Very Mild stenosis. Aorta dilated to 42 mm.  Stable.  RAP 3 mmHg   TRANSTHORACIC ECHOCARDIOGRAM  08/15/2020   EF 60 to 65%.  No R WMA.  GRII DD.  Mild Aortic Stenosis (mean gradient 10 mmHg).  Moderate to severe aortic root dilation (45 mm) normal IVC.  (Stable compared to February 2020)    Social History:  reports that she has never smoked. She has never used smokeless tobacco. She reports current alcohol use of about 7.0 standard drinks of alcohol per week. She reports that she does not use drugs.  Allergies  Allergen Reactions   Amiodarone Other (See Comments)    Nausea, ataxia, weakness and chills.  Chest pain, dyspnea; near syncope   Dofetilide Other (See Comments)    Torsades and  cardiac arrest during dofetilide initiation   Fentanyl Shortness Of Breath and Rash   Rosuvastatin Anaphylaxis, Anxiety, Hives, Itching, Nausea And Vomiting, Other (See Comments), Palpitations, Photosensitivity and Shortness Of Breath   Amoxicillin Diarrhea    Severe diarrhea, rash to vaginal area with swelling    Hctz [Hydrochlorothiazide] Other (See Comments)    HypoNatremia   Hydralazine     Weak, sweats, red skin   Codeine Other (See Comments)    makes her hyper   Conjugated Estrogens Itching and Rash   Erythromycin Rash    had a rash with emycin, has done ok with other meds in it's class   Piroxicam Itching and Rash    Feldene    Family History  Problem Relation Age of Onset   Arthritis Mother    Leukemia Father    Heart disease Maternal Uncle    Diabetes Paternal Aunt    Breast cancer Maternal  Grandmother    Heart disease Maternal Grandfather    Breast cancer Paternal Grandmother    Colon cancer Neg Hx     Prior to Admission medications   Medication Sig Start Date End Date Taking? Authorizing Provider  albuterol (VENTOLIN HFA) 108 (90 Base) MCG/ACT inhaler INHALE 2 PUFFS INTO THE LUNGS EVERY 6 HOURS AS NEEDED FOR WHEEZING OR SHORTNESS OF BREATH 01/21/22  Yes [name], MD  ALPRAZolam Duanne Moron) 0.5 MG tablet TAKE 1 TABLET(0.5 MG) BY MOUTH TWICE DAILY AS NEEDED FOR ANXIETY 02/25/22  Yes [name], MD  carisoprodol (SOMA) 350 MG tablet TAKE 1 TABLET(350 MG) BY MOUTH THREE TIMES DAILY AS NEEDED FOR MUSCLE SPASMS 11/19/21  Yes [name], MD  cloNIDine (CATAPRES) 0.2 MG tablet Take 0.1 mg by mouth 2 (two) times daily as needed (systolic over 220). if blood pressure is great than 254 systolic for more than an hour 08/20/21  Yes [name], MD  Eszopiclone 3 MG TABS Take 1 tablet (3 mg total) by mouth at bedtime. Take immediately before bedtime 03/28/22  Yes [name], MD  feeding supplement, GLUCERNA SHAKE, (GLUCERNA SHAKE) LIQD Take  237 mLs by mouth daily as needed (if not eating meals).   Yes [provider]  FLUoxetine (PROZAC) 40 MG capsule TAKE 1 CAPSULE(40 MG) BY MOUTH DAILY 04/18/22  Yes [name], MD  levothyroxine (SYNTHROID) 125 MCG tablet TAKE 1 TABLET(125 MCG) BY MOUTH DAILY 01/21/22  Yes [name], MD  LYLLANA 0.05 MG/24HR patch Place 1 patch onto the skin daily as needed (hormonal). 09/03/21  Yes [provider]  metoprolol tartrate (LOPRESSOR) 25 MG tablet Take 25 mg tablet as needed if heart rate is above 100 after waiting for 10 minutes , if systolic blood pressure is less than 110 do not take metoprolol. 03/13/22  Yes [name], MD  montelukast (SINGULAIR) 10 MG tablet TAKE 1 TABLET(10 MG) BY MOUTH AT BEDTIME 12/25/21  Yes [name], MD  MYRBETRIQ 50 MG TB24 tablet TAKE 1 TABLET(50 MG) BY MOUTH DAILY 10/10/21  Yes [name], MD  nebivolol (BYSTOLIC) 10 MG tablet TAKE 1 TABLET(10 MG) BY MOUTH DAILY AT 8 PM 09/26/21  Yes [name], MD  Rivaroxaban (XARELTO) 15 MG TABS tablet Take 1 tablet (15 mg total) by mouth daily with supper. STOP ELIQUIS 01/07/22  Yes [name], MD  triamcinolone cream (KENALOG) 0.1 % Apply 1 application. topically 2 (two) times daily. Patient taking differently: Apply 1 application  topically 2 (two) times daily as needed (skin irritation). 02/11/22  Yes [name], MD  Respiratory Therapy Supplies (FLUTTER) DEVI Use as directed 11/18/16   Tanda Rockers, MD    Physical Exam: Vitals:   04/29/22 0641 04/29/22 1300 04/29/22 1412 04/29/22 1818  BP: (!) 143/65  (!) 123/98   Pulse: (!) 56 (!) 56 (!) 51 60  Resp: 18 19 13 19   Temp: 98.1 F (36.7 C)  98.1 F (36.7 C)   TempSrc: Axillary  Oral   SpO2: 93% 99% 93% 93%  Weight:      Height:       General:  Appears calm and comfortable and is in NAD Eyes:   EOMI, normal lids, iris ENT:  grossly normal hearing, lips & tongue, mmm Neck:  no LAD,  masses or thyromegaly Cardiovascular:  RRR, no m/r/g. No LE edema.  Respiratory:   CTA bilaterally with no wheezes/rales/rhonchi.  Normal respiratory effort.  Abdomen:  soft, NT, ND Skin:  no rash or induration seen on limited exam Musculoskeletal:  grossly normal tone BUE/BLE, good ROM, no bony abnormality Psychiatric:  mildly confused but appropriate mood and affect, speech fluent and appropriate Neurologic:  CN 2-12 grossly intact, moves all extremities in coordinated fashion   Radiological Exams on Admission: Independently reviewed - see discussion in A/P where applicable  ECHOCARDIOGRAM LIMITED  Result Date: 04/29/2022    ECHOCARDIOGRAM LIMITED REPORT   Patient Name:   Marissa [name] Date of Exam: 04/29/2022 Medical Rec #:  [id number]       Height:       64.0 in Accession #:    [id number]      Weight:       120.1 lb Date of Birth:  12-28-1945       BSA:          1.575 m Patient Age:    1 years        BP:           143/65 mmHg Patient Gender: F               HR:           54 bpm. Exam Location:  Inpatient Procedure: Limited Echo, Cardiac Doppler and Color Doppler Indications:    R00.9* Unspecified abnormalities of heart beat. Cardiac arrest  History:        Patient has prior history of Echocardiogram examinations, most                 recent 08/08/2021. Abnormal ECG, Stroke, Arrythmias:Cardiac                 Arrest and Atrial Fibrillation; Signs/Symptoms:Dyspnea and                 Altered Mental Status.  Sonographer:    Roseanna Rainbow RDCS Referring Phys: Baldwin [place]  Sonographer Comments: Limited echo for LV EF. IMPRESSIONS  1. Limited study to assess LV function; full doppler not performed.  2. Left ventricular ejection fraction, by estimation, is 60 to 65%. The left ventricle has normal function. The left ventricle has no regional wall motion abnormalities. There is mild left ventricular hypertrophy.  3. Right ventricular systolic function is moderately reduced. The right ventricular size is  mildly enlarged.  4. Left atrial size was mildly dilated.  5. Right atrial size was mildly dilated.  6. The mitral valve is normal in structure.  7. The aortic valve is bicuspid.  8. Aortic dilatation noted. There is mild dilatation of the ascending aorta, measuring 43 mm.  9. The inferior vena cava is normal in size with greater than 50% respiratory variability, suggesting right atrial pressure of 3 mmHg. FINDINGS  Left Ventricle: Left ventricular ejection fraction, by estimation, is 60 to 65%. The left ventricle has normal function. The left ventricle has no regional wall motion abnormalities. The left ventricular internal cavity size was normal in size. There is  mild left ventricular hypertrophy. Right Ventricle: The right ventricular size is mildly enlarged. Right ventricular systolic function is moderately reduced. The tricuspid regurgitant velocity is 2.68 m/s, and with an assumed right atrial pressure of 8 mmHg, the estimated right ventricular systolic pressure is 95.1 mmHg. Left Atrium: Left atrial size was mildly dilated. Right Atrium: Right atrial size was mildly dilated. Pericardium: There is no evidence of pericardial effusion. Mitral Valve: The mitral valve is normal in structure. Tricuspid Valve: The tricuspid valve is normal  in structure. Tricuspid valve regurgitation is mild. Aortic Valve: The aortic valve is bicuspid. Pulmonic Valve: The pulmonic valve was grossly normal. Pulmonic valve regurgitation is trivial. Aorta: Aortic dilatation noted. There is mild dilatation of the ascending aorta, measuring 43 mm. Venous: The inferior vena cava is normal in size with greater than 50% respiratory variability, suggesting right atrial pressure of 3 mmHg. Additional Comments: Limited study to assess LV function; full doppler not performed. LEFT VENTRICLE PLAX 2D LVIDd:         3.30 cm LVIDs:         2.40 cm LV PW:         1.20 cm LV IVS:        1.20 cm  LV Volumes (MOD) LV vol d, MOD A2C: 49.4 ml LV vol d,  MOD A4C: 33.6 ml LV vol s, MOD A2C: 18.0 ml LV vol s, MOD A4C: 15.4 ml LV SV MOD A2C:     31.4 ml LV SV MOD A4C:     33.6 ml LV SV MOD BP:      25.2 ml LEFT ATRIUM         Index LA diam:    2.50 cm 1.59 cm/m   AORTA Ao Root diam: 3.30 cm Ao Asc diam:  4.30 cm TRICUSPID VALVE TR Peak grad:   28.7 mmHg TR Vmax:        268.00 cm/s Kirk Ruths MD Electronically signed by Kirk Ruths MD Signature Date/Time: 04/29/2022/1:17:39 PM    Final    CT HEAD WO CONTRAST (5MM)  Result Date: 04/28/2022 CLINICAL DATA:  Neuro deficit, acute, stroke suspected. Altered mental status. EXAM: CT HEAD WITHOUT CONTRAST TECHNIQUE: Contiguous axial images were obtained from the base of the skull through the vertex without intravenous contrast. RADIATION DOSE REDUCTION: This exam was performed according to the departmental dose-optimization program which includes automated exposure control, adjustment of the mA and/or kV according to patient size and/or use of iterative reconstruction technique. COMPARISON:  Without contrast 08/06/2021. MR head without contrast 08/06/2021. FINDINGS: Brain: Expected evolution of right parietal infarct noted. Mild atrophy and white matter changes are otherwise stable. Basal ganglia are intact. Insular ribbon is normal. No other acute or focal cortical abnormalities are present. The brainstem and cerebellum are within normal limits. Vascular: Atherosclerotic calcifications are again seen within the cavernous internal carotid arteries. No hyperdense vessel is present. Skull: Calvarium is intact. No focal lytic or blastic lesions are present. No significant extracranial soft tissue lesion is present. Sinuses/Orbits: The paranasal sinuses and mastoid air cells are clear. Bilateral lens replacements are noted. Globes and orbits are otherwise unremarkable. IMPRESSION: 1. Expected evolution of right parietal infarct. 2. Stable atrophy and white matter disease. 3. No acute intracranial abnormality or significant  interval change. Electronically Signed   By: San Morelle M.D.   On: 04/28/2022 17:19    EKG: Independently reviewed.   Today:  Sinus bradycardia with rate 53; prolonged QTc 512; nonspecific ST changes with no evidence of acute ischemia 7/1:  Sinus bradycardia with rate 55; prolonged QTc 541; nonspecific ST changes with no evidence of acute ischemia   Labs on Admission: I have personally reviewed the available labs and imaging studies at the time of the admission.  Pertinent labs:    Na++ 132 - stable BUN 23/Creatinine 0.98/GFR 59 - stable HS troponin 10   Family Communication: Husband was present throughout evaluation Primary team communication: I spoke with cardiology by telephone and in person on the morning  of the consult.   Thank you very much for involving [place] in the care of your patient.  Author: Karmen Bongo, MD 04/29/2022 6:51 PM  For on call review [email or web address].

## 2022-04-29 NOTE — Assessment & Plan Note (Signed)
-  Likely associated with Tikosyn response and cardiac arrest, anticipate slow resolution -Will attempt to avoid QT-prolonging medications such as PPI, nausea meds, SSRIs -This is challenging given her delirium; discussed with pharmacy and low-dose PO Seroquel with avoidance of other QT prolonging agents is likely her best bet -Repeat EKG qAM

## 2022-04-29 NOTE — Assessment & Plan Note (Signed)
-  Continue Xarelto -Rate controlled with nebivolol, prn lopressor

## 2022-04-29 NOTE — Progress Notes (Signed)
Progress Note  Patient Name: Marissa [name] Date of Encounter: 04/29/2022  [facility] Cardiologist: [name], MD   Subjective   Awake, alert, very pleasant, oriented to self only, frustrated though, aware she is unable to answer questions she thinks she should know....  Inpatient Medications    Scheduled Meds:  acetaminophen  500 mg Oral Q6H   FLUoxetine  40 mg Oral Daily   hydrocortisone cream   Topical TID   levothyroxine  125 mcg Oral Q0600   melatonin  5 mg Oral QHS   montelukast  10 mg Oral QHS   nebivolol  10 mg Oral Daily   rivaroxaban  15 mg Oral Q supper   sodium chloride flush  3 mL Intravenous Q12H   traZODone  100 mg Oral QHS   Continuous Infusions:  PRN Meds: acetaminophen, albuterol, carisoprodol, cloNIDine, ketorolac, LORazepam, metoprolol tartrate, oxyCODONE, sodium chloride flush, trimethobenzamide, zolpidem   Vital Signs    Vitals:   04/28/22 1859 04/28/22 1900 04/28/22 2048 04/29/22 0641  BP:   (!) 166/77 (!) 143/65  Pulse:   72 (!) 56  Resp: (!) 23 19 (!) 21 18  Temp:   (!) 97.4 F (36.3 C) 98.1 F (36.7 C)  TempSrc:   Axillary Axillary  SpO2:  99% 93% 93%  Weight:      Height:        Intake/Output Summary (Last 24 hours) at 04/29/2022 0721 Last data filed at 04/28/2022 1854 Gross per 24 hour  Intake 180 ml  Output 700 ml  Net -520 ml      04/23/2022   12:52 PM 04/23/2022   11:16 AM 04/03/2022    1:52 PM  Last 3 Weights  Weight (lbs) 120 lb 1.6 oz 121 lb 6.4 oz 132 lb  Weight (kg) 54.477 kg 55.067 kg 59.875 kg      Telemetry    SR generally  60's, once a brief AT 120's - Personally Reviewed  ECG    No new EKGs  7/1 EKG  reviewed SB 55bpm, marked T changes, QT prolongation - Personally Reviewed  Physical Exam   GEN: No acute distress.   Neck: No JVD Cardiac: RRR, no murmurs, rubs, or gallops.  Respiratory: CTA b/l. GI: Soft, nontender, non-distended  MS: No edema; No deformity. Neuro: oriented to self, no gross  focal motor deficits  Psych: very pleasant and cooperative  Labs    High Sensitivity Troponin:   Recent Labs  Lab 04/28/22 1302  TROPONINIHS 10     Chemistry Recent Labs  Lab 04/27/22 0726 04/28/22 0721 04/29/22 0555  NA 132* 131* 132*  K 4.7 4.5 4.6  CL 98 99 95*  CO2 21* 21* 22  GLUCOSE 113* 118* 99  BUN 24* 29* 23  CREATININE 1.09* 1.05* 0.98  CALCIUM 9.3 9.2 9.4  MG 1.7 1.7 2.0  GFRNONAA 52* 55* 59*  ANIONGAP 13 11 15     Lipids No results for input(s): "CHOL", "TRIG", "HDL", "LABVLDL", "LDLCALC", "CHOLHDL" in the last 168 hours.  Hematology Recent Labs  Lab 04/27/22 0726  WBC 6.6  RBC 2.98*  2.99*  HGB 9.7*  HCT 28.0*  MCV 94.0  MCH 32.6  MCHC 34.6  RDW 13.3  PLT 199   Thyroid No results for input(s): "TSH", "FREET4" in the last 168 hours.  BNPNo results for input(s): "BNP", "PROBNP" in the last 168 hours.  DDimer No results for input(s): "DDIMER" in the last 168 hours.   Radiology  CT HEAD WO CONTRAST (5MM) Result Date: 04/28/2022 CLINICAL DATA:  Neuro deficit, acute, stroke suspected. Altered mental status. EXAM: CT HEAD WITHOUT CONTRAST TECHNIQUE: Contiguous axial images were obtained from the base of the skull through the vertex without intravenous contrast. RADIATION DOSE REDUCTION: This exam was performed according to the departmental dose-optimization program which includes automated exposure control, adjustment of the mA and/or kV according to patient size and/or use of iterative reconstruction technique. COMPARISON:  Without contrast 08/06/2021. MR head without contrast 08/06/2021. FINDINGS: Brain: Expected evolution of right parietal infarct noted. Mild atrophy and white matter changes are otherwise stable. Basal ganglia are intact. Insular ribbon is normal. No other acute or focal cortical abnormalities are present. The brainstem and cerebellum are within normal limits. Vascular: Atherosclerotic calcifications are again seen within the cavernous  internal carotid arteries. No hyperdense vessel is present. Skull: Calvarium is intact. No focal lytic or blastic lesions are present. No significant extracranial soft tissue lesion is present. Sinuses/Orbits: The paranasal sinuses and mastoid air cells are clear. Bilateral lens replacements are noted. Globes and orbits are otherwise unremarkable. IMPRESSION: 1. Expected evolution of right parietal infarct. 2. Stable atrophy and white matter disease. 3. No acute intracranial abnormality or significant interval change. Electronically Signed   By: San Morelle M.D.   On: 04/28/2022 17:19    Cardiac Studies    08/08/21: TTE  1. Left ventricular ejection fraction, by estimation, is 60 to 65%. The  left ventricle has normal function. The left ventricle has no regional  wall motion abnormalities. Left ventricular diastolic parameters were  normal.   2. Right ventricular systolic function is normal. The right ventricular  size is normal. There is normal pulmonary artery systolic pressure.   3. The mitral valve is abnormal. Trivial mitral valve regurgitation. No  evidence of mitral stenosis.   4. ? functoinally bicuspid with poor visualization of commisure between  right and left cusp Non coronary cusp very sclerotic Gradients stable  since TTE done 08/15/20. The aortic valve is tricuspid. There is moderate  calcification of the aortic valve.  Aortic valve regurgitation is not visualized. Mild aortic valve stenosis.   5. Aortic root stable since TTE done 08/15/20 . Aortic dilatation noted.  There is moderate dilatation of the ascending aorta, measuring 42 mm.   6. The inferior vena cava is normal in size with greater than 50%  respiratory variability, suggesting right atrial pressure of 3 mmHg.   Patient Profile     76 y.o. female  HTN, HLD, chronic fatigue syndrome, anxiety/depression, dementia, suffered a stroke Oct 2022 > loop > Afib  Admitted for Tikosyn load complicated by Torsades  de pointes / VF / PMVT she had brief CPR > ICU > back to 6E, post arrest delirium   Assessment & Plan    Persistent Afib CHA2DS2Vasc is 6, on Xarelto, appropriately dosed for calcCrCl 41 Maintaining SR Failed Tikosyn  2.  Cardiac arrest 2/2 Tikosyn + CPR, no shock  3. Abnormal EKG 7/1 Chest wall pain HS trop neg In review of notes over the weekend With waxing/waning delirium trazodone was added, (AFTER the EKG) Though with her very abnormal EKG and torsades with Tikosyn, I worry about trazodone for her and will stop it  Will update her echo, ? WMA Today's EKG is pending    4. Delirium I am uncertain of her baseline, though in review of notes, appears that she has some degree of baseline dementia and confusion perhaps Waxing/waning near baseline at times  by notes. As above I worry about trazodone In Dr. Olin Pia weekend notes, d/w neurology, CT scan negative afor anything new Did not think she would be able to do an MRI  Recs for CIR by PT, pt/family prefer SNF ([facility], TOC team is on board)  Will ask for IM help    5. Urinary retention Will ask for IM help   HTN Looks ok   For questions or updates, please contact [place] Please consult [email or web address] for contact info under        Signed, Baldwin [place], PA-C  04/29/2022, 7:21 AM

## 2022-04-29 NOTE — Assessment & Plan Note (Signed)
-  Patient has been quite agitated and had significant sundowning since her event -This does appear to be mildly improved now and she was back to her usual dementia baseline this AM - but may recur as night approaches -Will order delirium precautions -Would avoid BZD both because of risk of increased agitation as well as QTc prolongation -Haldol is less preferred due to QTc -Will order low-dose standing qhs Seroquel; can titrate as needed once QTc normalizes

## 2022-04-29 NOTE — Assessment & Plan Note (Signed)
-  Continue home nebivolol and clonidine

## 2022-04-29 NOTE — TOC Progression Note (Signed)
Transition of Care [facility]) - Progression Note    Patient Details  Name: Marissa [name] MRN: [id number] Date of Birth: 1946/06/30  Transition of Care [facility]) CM/SW [place], [place] Phone Number: 04/29/2022, 1:13 PM  Clinical Narrative:     CSW spoke with Whitney from [facility] to see if she can offer SNF bed for patient. Whitney requested therapy notes. CSW sent over most recent therapy notes. CSW awaiting determination. No current SNF bed offers.  Family interested in [place] if no bed offers in [place] or if no SNF bed offer with [facility]. CSW will continue to follow.  Expected Discharge Plan: [place] Barriers to Discharge: SNF Pending bed offer, Continued Medical Work up, Ship broker  Expected Discharge Plan and Services Expected Discharge Plan: [place] arrangements for the past 2 months: Single Family Home                                       Social Determinants of Health (SDOH) Interventions    Readmission Risk Interventions     No data to display

## 2022-04-29 NOTE — Assessment & Plan Note (Signed)
-  LDL is 109, goal <100 -Management per cardiology

## 2022-04-30 DIAGNOSIS — E785 Hyperlipidemia, unspecified: Secondary | ICD-10-CM

## 2022-04-30 DIAGNOSIS — R338 Other retention of urine: Secondary | ICD-10-CM

## 2022-04-30 DIAGNOSIS — E038 Other specified hypothyroidism: Secondary | ICD-10-CM

## 2022-04-30 DIAGNOSIS — F05 Delirium due to known physiological condition: Secondary | ICD-10-CM | POA: Diagnosis not present

## 2022-04-30 DIAGNOSIS — I469 Cardiac arrest, cause unspecified: Secondary | ICD-10-CM | POA: Diagnosis not present

## 2022-04-30 DIAGNOSIS — I4819 Other persistent atrial fibrillation: Secondary | ICD-10-CM | POA: Diagnosis not present

## 2022-04-30 LAB — BASIC METABOLIC PANEL
Anion gap: 12 (ref 5–15)
BUN: 19 mg/dL (ref 8–23)
CO2: 22 mmol/L (ref 22–32)
Calcium: 8.9 mg/dL (ref 8.9–10.3)
Chloride: 94 mmol/L — ABNORMAL LOW (ref 98–111)
Creatinine, Ser: 0.87 mg/dL (ref 0.44–1.00)
GFR, Estimated: >60 mL/min (ref >60)
Glucose, Bld: 92 mg/dL (ref 70–99)
Potassium: 4.3 mmol/L (ref 3.5–5.1)
Sodium: 128 mmol/L — ABNORMAL LOW (ref 135–145)

## 2022-04-30 LAB — TSH: TSH: 0.477 u[IU]/mL (ref 0.350–4.500)

## 2022-04-30 LAB — MAGNESIUM: Magnesium: 1.6 mg/dL — ABNORMAL LOW (ref 1.7–2.4)

## 2022-04-30 LAB — OSMOLALITY: Osmolality: 274 mOsm/kg — ABNORMAL LOW (ref 275–295)

## 2022-04-30 LAB — OSMOLALITY, URINE: Osmolality, Ur: 225 mOsm/kg — ABNORMAL LOW (ref 300–900)

## 2022-04-30 LAB — SODIUM, URINE, RANDOM: Sodium, Ur: 34 mmol/L

## 2022-04-30 MED ORDER — MAGNESIUM SULFATE 2 GM/50ML IV SOLN
2.0000 g | Freq: Once | INTRAVENOUS | Status: DC
  Filled 2022-04-30: qty 50

## 2022-04-30 MED ORDER — MAGNESIUM SULFATE 2 GM/50ML IV SOLN
2.0000 g | Freq: Once | INTRAVENOUS | Status: AC
Start: 2022-04-30 — End: 2022-04-30
  Administered 2022-04-30: 2 g via INTRAVENOUS
  Filled 2022-04-30: qty 50

## 2022-04-30 NOTE — TOC Progression Note (Addendum)
Transition of Care [facility]) - Progression Note    Patient Details  Name: Breta Demedeiros MRN: [id number] Date of Birth: 02/07/1946  Transition of Care [facility]) CM/SW [place], [place] Phone Number: 04/30/2022, 8:44 AM  Clinical Narrative:     CSW received insurance authorization approval for patient. Reference # [id number]. Insurance authorization has been approved from 7/4-7/6. Next review date is 7/6. Whitney with [facility] confirmed patient has SNF bed at [facility] on Wednesday when medically ready for dc. CSW will continue to follow and assist with patients dc planning needs.  Expected Discharge Plan: [place] Barriers to Discharge: SNF Pending bed offer, Continued Medical Work up, Ship broker  Expected Discharge Plan and Services Expected Discharge Plan: [place] arrangements for the past 2 months: Single Family Home                                       Social Determinants of Health (SDOH) Interventions    Readmission Risk Interventions     No data to display

## 2022-04-30 NOTE — Progress Notes (Signed)
[facility] consult progress note                                                                              Marissa [name], is a 76 y.o. female, DOB - [birth year], SHF:[id number] Admit date - 04/23/2022    Outpatient Primary MD for the patient is Marissa Koch, MD  LOS - 7  days  No chief complaint on file.      Brief summary   Admitted for Tikosyn and suffered VF arrest on 6/28.  Confusion, unsure how acute.  Head CT negative on the weekend.  QT is very long, was on trazodone and this was stopped.  I/O cath also done intermittently. TRH consulted for management of delirium superimposed on dementia, urinary retention  Assessment & Plan    Principal Problem:   Cardiac arrest ([place]), prolonged QT interval -Management per EP cardiology, primary team  Active Problems: Delirium superimposed on dementia -Patient appeared to be quite agitated yesterday and had significant sundowning -On my examination today she is much more alert and oriented (oriented to place, person, not to time, able to respond to questions appropriately) however per her husband at the bedside, she is still somewhat confused and has underlying dementia -Agree with Seroquel 25 mg at bedtime, if needed for agitation, titrate up to twice daily -Avoid BZD's or Haldol (prolonged QTc) -No fevers, leukocytosis, dysuria/hematuria or any other signs of infection     Acute urinary retention -Continue Flomax, will need outpatient urology follow-up    Persistent atrial fibrillation (Marissa [name]): CHA2DS2-VASc Score = 7, hypertension, HLP   -Per cardiology, primary team    Hypothyroidism -Continue Synthroid  hypomagnesemia -Replaced  Mild hyponatremia -Will obtain serum osmolarity, urine osmolarity, UNA, TSH  Code Status: Full code DVT Prophylaxis:   Rivaroxaban (XARELTO) tablet 15 mg   Level of Care: Level of care: Progressive Family Communication: Updated patient's husband at the bedside full  code   Disposition Plan:    Per primary team   Medications  acetaminophen  500 mg Oral Q6H   Chlorhexidine Gluconate Cloth  6 each Topical Daily   hydrocortisone cream   Topical TID   levothyroxine  125 mcg Oral Q0600   melatonin  5 mg Oral QHS   montelukast  10 mg Oral QHS   nebivolol  10 mg Oral Daily   QUEtiapine  25 mg Oral QHS   rivaroxaban  15 mg Oral Q supper   sodium chloride flush  3 mL Intravenous Q12H   tamsulosin  0.4 mg Oral QPC supper      Subjective:   Marissa [name] was seen and examined today.  Currently alert and oriented to place and person, appropriately able to answer questions.  Denies any dizziness, chest pain, shortness of breath, abdominal pain, nausea vomiting.  No acute events overnight. Objective:   Vitals:   04/29/22 1412 04/29/22 1818 04/29/22 2030 04/30/22 0511  BP: (!) 123/98  (!) 176/81 (!) 189/85  Pulse: (!) 51 60 63 69  Resp: 13 19 17 15   Temp: 98.1 F (36.7 C)  98.3 F (36.8 C) 98.2 F (36.8 C)  TempSrc: Oral  Oral Oral  SpO2: 93% 93% 100% 97%  Weight:      Height:        Intake/Output Summary (Last 24 hours) at 04/30/2022 1300 Last data filed at 04/30/2022 0530 Gross per 24 hour  Intake 480 ml  Output 1175 ml  Net -695 ml     Wt Readings from Last 3 Encounters:  04/23/22 54.5 kg  04/23/22 55.1 kg  04/03/22 59.9 kg     Exam General: Alert and oriented x place and person, knows she is in [facility] in [place].  Recognize to her family members pictures Cardiovascular: S1 S2 auscultated,  RRR Respiratory: Clear to auscultation bilaterally Gastrointestinal: Soft, nontender, nondistended, + bowel sounds Ext: no pedal edema bilaterally Neuro: moving all 4 extremities spontaneously Psych: has dementia    Data Reviewed:  I have personally reviewed following labs    CBC Lab Results  Component Value Date   WBC 6.6 04/27/2022   RBC 2.98 (L) 04/27/2022   RBC 2.99 (L) 04/27/2022   HGB 9.7 (L) 04/27/2022   HCT  28.0 (L) 04/27/2022   MCV 94.0 04/27/2022   MCH 32.6 04/27/2022   PLT 199 04/27/2022   MCHC 34.6 04/27/2022   RDW 13.3 04/27/2022   LYMPHSABS 0.7 04/27/2022   MONOABS 0.8 04/27/2022   EOSABS 0.1 04/27/2022   BASOSABS 0.0 55/73/2202     Last metabolic panel Lab Results  Component Value Date   NA 128 (L) 04/30/2022   K 4.3 04/30/2022   CL 94 (L) 04/30/2022   CO2 22 04/30/2022   BUN 19 04/30/2022   CREATININE 0.87 04/30/2022   GLUCOSE 92 04/30/2022   GFRNONAA >60 04/30/2022   GFRAA 93 01/12/2020   CALCIUM 8.9 04/30/2022   PHOS 2.4 (L) 04/24/2022   PROT 6.6 01/28/2022   ALBUMIN 4.2 01/28/2022   LABGLOB 2.0 04/09/2021   AGRATIO 2.1 04/09/2021   BILITOT 0.4 01/28/2022   ALKPHOS 56 01/28/2022   AST 37 01/28/2022   ALT 26 01/28/2022   ANIONGAP 12 04/30/2022    CBG (last 3)  No results for input(s): "GLUCAP" in the last 72 hours.    Coagulation Profile: No results for input(s): "INR", "PROTIME" in the last 168 hours.   Radiology Studies: I have personally reviewed the imaging studies  ECHOCARDIOGRAM LIMITED  Result Date: 04/29/2022    ECHOCARDIOGRAM LIMITED REPORT   Patient Name:   Marissa [name] Date of Exam: 04/29/2022 Medical Rec #:  [id number]       Height:       64.0 in Accession #:    [id number]      Weight:       120.1 lb Date of Birth:  June 20, 1946       BSA:          1.575 m Patient Age:    71 years        BP:           143/65 mmHg Patient Gender: F               HR:           54 bpm. Exam Location:  Inpatient Procedure: Limited Echo, Cardiac Doppler and Color Doppler Indications:    R00.9* Unspecified abnormalities of heart beat. Cardiac arrest  History:        Patient has prior history of Echocardiogram examinations, most                 recent 08/08/2021. Abnormal  ECG, Stroke, Arrythmias:Cardiac                 Arrest and Atrial Fibrillation; Signs/Symptoms:Dyspnea and                 Altered Mental Status.  Sonographer:    Roseanna Rainbow RDCS Referring Phys: [place]  Sonographer Comments: Limited echo for LV EF. IMPRESSIONS  1. Limited study to assess LV function; full doppler not performed.  2. Left ventricular ejection fraction, by estimation, is 60 to 65%. The left ventricle has normal function. The left ventricle has no regional wall motion abnormalities. There is mild left ventricular hypertrophy.  3. Right ventricular systolic function is moderately reduced. The right ventricular size is mildly enlarged.  4. Left atrial size was mildly dilated.  5. Right atrial size was mildly dilated.  6. The mitral valve is normal in structure.  7. The aortic valve is bicuspid.  8. Aortic dilatation noted. There is mild dilatation of the ascending aorta, measuring 43 mm.  9. The inferior vena cava is normal in size with greater than 50% respiratory variability, suggesting right atrial pressure of 3 mmHg. FINDINGS  Left Ventricle: Left ventricular ejection fraction, by estimation, is 60 to 65%. The left ventricle has normal function. The left ventricle has no regional wall motion abnormalities. The left ventricular internal cavity size was normal in size. There is  mild left ventricular hypertrophy. Right Ventricle: The right ventricular size is mildly enlarged. Right ventricular systolic function is moderately reduced. The tricuspid regurgitant velocity is 2.68 m/s, and with an assumed right atrial pressure of 8 mmHg, the estimated right ventricular systolic pressure is 32.3 mmHg. Left Atrium: Left atrial size was mildly dilated. Right Atrium: Right atrial size was mildly dilated. Pericardium: There is no evidence of pericardial effusion. Mitral Valve: The mitral valve is normal in structure. Tricuspid Valve: The tricuspid valve is normal in structure. Tricuspid valve regurgitation is mild. Aortic Valve: The aortic valve is bicuspid. Pulmonic Valve: The pulmonic valve was grossly normal. Pulmonic valve regurgitation is trivial. Aorta: Aortic dilatation noted. There is mild  dilatation of the ascending aorta, measuring 43 mm. Venous: The inferior vena cava is normal in size with greater than 50% respiratory variability, suggesting right atrial pressure of 3 mmHg. Additional Comments: Limited study to assess LV function; full doppler not performed. LEFT VENTRICLE PLAX 2D LVIDd:         3.30 cm LVIDs:         2.40 cm LV PW:         1.20 cm LV IVS:        1.20 cm  LV Volumes (MOD) LV vol d, MOD A2C: 49.4 ml LV vol d, MOD A4C: 33.6 ml LV vol s, MOD A2C: 18.0 ml LV vol s, MOD A4C: 15.4 ml LV SV MOD A2C:     31.4 ml LV SV MOD A4C:     33.6 ml LV SV MOD BP:      25.2 ml LEFT ATRIUM         Index LA diam:    2.50 cm 1.59 cm/m   AORTA Ao Root diam: 3.30 cm Ao Asc diam:  4.30 cm TRICUSPID VALVE TR Peak grad:   28.7 mmHg TR Vmax:        268.00 cm/s Kirk Ruths MD Electronically signed by Kirk Ruths MD Signature Date/Time: 04/29/2022/1:17:39 PM    Final    CT HEAD WO CONTRAST (5MM)  Result Date: 04/28/2022 CLINICAL  DATA:  Neuro deficit, acute, stroke suspected. Altered mental status. EXAM: CT HEAD WITHOUT CONTRAST TECHNIQUE: Contiguous axial images were obtained from the base of the skull through the vertex without intravenous contrast. RADIATION DOSE REDUCTION: This exam was performed according to the departmental dose-optimization program which includes automated exposure control, adjustment of the mA and/or kV according to patient size and/or use of iterative reconstruction technique. COMPARISON:  Without contrast 08/06/2021. MR head without contrast 08/06/2021. FINDINGS: Brain: Expected evolution of right parietal infarct noted. Mild atrophy and white matter changes are otherwise stable. Basal ganglia are intact. Insular ribbon is normal. No other acute or focal cortical abnormalities are present. The brainstem and cerebellum are within normal limits. Vascular: Atherosclerotic calcifications are again seen within the cavernous internal carotid arteries. No hyperdense vessel is present.  Skull: Calvarium is intact. No focal lytic or blastic lesions are present. No significant extracranial soft tissue lesion is present. Sinuses/Orbits: The paranasal sinuses and mastoid air cells are clear. Bilateral lens replacements are noted. Globes and orbits are otherwise unremarkable. IMPRESSION: 1. Expected evolution of right parietal infarct. 2. Stable atrophy and white matter disease. 3. No acute intracranial abnormality or significant interval change. Electronically Signed   By: [place] More[place] M.D.   On: 04/28/2022 17:19       Arlon Bleier M.D. [facility] 04/30/2022, 1:00 PM  Available via Epic secure chat 7am-7pm After 7 pm, please refer to night coverage provider listed on amion.

## 2022-04-30 NOTE — Progress Notes (Signed)
Mobility Specialist Progress Note    04/30/22 1519  Mobility  Activity Ambulated with assistance in hallway  Level of Assistance Minimal assist, patient does 75% or more  Assistive Device Four wheel walker  Distance Ambulated (ft) 140 ft  Activity Response Tolerated well  $Mobility charge 1 Mobility   Pre-Mobility: 56 HR Post-Mobility: 57 HR  Pt received in bed and agreeable. C/o chest soreness and groaning in pain. Returned to bed with call bell in reach.    Hildred Alamin Mobility Specialist

## 2022-04-30 NOTE — Progress Notes (Signed)
Physical Therapy Treatment Patient Details Name: Marissa [name] MRN: [id number] DOB: 1946-02-24 Today's Date: 04/30/2022   History of Present Illness 76 y.o. female presents to [facility] hospital on 04/23/2022 with persistent afibm for dofetilide admission. Pt with episode of torsades on 6/28 requiring 2 minutes of CPR. Pt developed delirium during admission. PMH includes HTN, aortic stenosis, HLD, CVA.    PT Comments    Pt making steady progress with mobility. Hopeful for SNF soon for further rehab. Will continue to work on improving mobility and balance.    Recommendations for follow up therapy are one component of a multi-disciplinary discharge planning process, led by the attending physician.  Recommendations may be updated based on patient status, additional functional criteria and insurance authorization.  Follow Up Recommendations  Skilled nursing-short term rehab (<3 hours/day) Can patient physically be transported by private vehicle: Yes   Assistance Recommended at Discharge Frequent or constant Supervision/Assistance  Patient can return home with the following Assistance with cooking/housework;Assistance with feeding;Direct supervision/assist for medications management;Direct supervision/assist for financial management;Assist for transportation;Help with stairs or ramp for entrance;A little help with walking and/or transfers;A lot of help with bathing/dressing/bathroom   Equipment Recommendations   (TBD pending progress)    Recommendations for Other Services       Precautions / Restrictions Precautions Precautions: Fall Restrictions Weight Bearing Restrictions: No     Mobility  Bed Mobility Overal bed mobility: Needs Assistance Bed Mobility: Supine to Sit     Supine to sit: Mod assist, HOB elevated     General bed mobility comments: Assist to bring legs off of bed and to elevate trunk into sitting    Transfers Overall transfer level: Needs assistance Equipment used:  Rollator (4 wheels) Transfers: Sit to/from Stand Sit to Stand: Mod assist           General transfer comment: Assist to bring hips up and for balance. Assist for hand placement    Ambulation/Gait Ambulation/Gait assistance: Min assist Gait Distance (Feet): 70 Feet (70' x 1, 20' x 1) Assistive device: Rollator (4 wheels) Gait Pattern/deviations: Shuffle, Step-through pattern, Decreased step length - right, Decreased step length - left Gait velocity: decr Gait velocity interpretation: <1.31 ft/sec, indicative of household ambulator   General Gait Details: Assist for balance and support.   Stairs             Wheelchair Mobility    Modified Teresi (Stroke Patients Only)       Balance Overall balance assessment: Needs assistance Sitting-balance support: Feet supported, No upper extremity supported Sitting balance-Leahy Scale: Fair     Standing balance support: Bilateral upper extremity supported Standing balance-Leahy Scale: Poor Standing balance comment: walker and min guard for static standing                            Cognition Arousal/Alertness: Awake/alert Behavior During Therapy: WFL for tasks assessed/performed Overall Cognitive Status: Impaired/Different from baseline Area of Impairment: Orientation, Attention, Memory, Following commands, Safety/judgement, Awareness, Problem solving                 Orientation Level: Disoriented to, Time Current Attention Level: Focused, Sustained Memory: Decreased recall of precautions, Decreased short-term memory Following Commands: Follows one step commands with increased time Safety/Judgement: Decreased awareness of safety, Decreased awareness of deficits Awareness: Intellectual Problem Solving: Requires tactile cues, Difficulty sequencing, Requires verbal cues          Exercises  General Comments General comments (skin integrity, edema, etc.): VSS on RA      Pertinent Vitals/Pain  Pain Assessment Pain Assessment: Faces Faces Pain Scale: Hurts even more Pain Location: rt shoulder/chest Pain Descriptors / Indicators: Moaning, Aching, Grimacing Pain Intervention(s): Limited activity within patient's tolerance, Monitored during session, Repositioned    Home Living                          Prior Function            PT Goals (current goals can now be found in the care plan section) Acute Rehab PT Goals Patient Stated Goal: to return to prior level of function Progress towards PT goals: Progressing toward goals    Frequency    Min 3X/week      PT Plan Discharge plan needs to be updated    Co-evaluation              AM-PAC PT "6 Clicks" Mobility   Outcome Measure  Help needed turning from your back to your side while in a flat bed without using bedrails?: A Lot Help needed moving from lying on your back to sitting on the side of a flat bed without using bedrails?: A Lot Help needed moving to and from a bed to a chair (including a wheelchair)?: A Lot Help needed standing up from a chair using your arms (e.g., wheelchair or bedside chair)?: A Lot Help needed to walk in hospital room?: A Little Help needed climbing 3-5 steps with a railing? : Total 6 Click Score: 12    End of Session Equipment Utilized During Treatment: Gait belt Activity Tolerance: Patient tolerated treatment well Patient left: with call bell/phone within reach;with family/visitor present;in chair;with chair alarm set Nurse Communication: Mobility status PT Visit Diagnosis: Other abnormalities of gait and mobility (R26.89);Muscle weakness (generalized) (M62.81);Other symptoms and signs involving the nervous system (K02.542)     Time: 7062-3762 PT Time Calculation (min) (ACUTE ONLY): 21 min  Charges:  $Gait Training: 8-22 mins                     [place] [facility] [place] 04/30/2022, 11:59 AM

## 2022-04-30 NOTE — Progress Notes (Signed)
EP Progress Note  Patient Name: Marissa [name] Date of Encounter: 04/30/2022  [facility] Cardiologist: [name], MD   Subjective   Calm this morning.   IM saw patient yesterday. Recommended foley for urinary retention with outpatient urology follow up. Appreciate assistance with delirium.  Inpatient Medications    Scheduled Meds:  acetaminophen  500 mg Oral Q6H   Chlorhexidine Gluconate Cloth  6 each Topical Daily   hydrocortisone cream   Topical TID   levothyroxine  125 mcg Oral Q0600   melatonin  5 mg Oral QHS   montelukast  10 mg Oral QHS   nebivolol  10 mg Oral Daily   QUEtiapine  25 mg Oral QHS   rivaroxaban  15 mg Oral Q supper   sodium chloride flush  3 mL Intravenous Q12H   tamsulosin  0.4 mg Oral QPC supper   Continuous Infusions:  PRN Meds: acetaminophen, cloNIDine, ketorolac, metoprolol tartrate, oxyCODONE, sodium chloride flush, trimethobenzamide, zolpidem   Vital Signs    Vitals:   04/29/22 1412 04/29/22 1818 04/29/22 2030 04/30/22 0511  BP: (!) 123/98  (!) 176/81 (!) 189/85  Pulse: (!) 51 60 63 69  Resp: 13 19 17 15   Temp: 98.1 F (36.7 C)  98.3 F (36.8 C) 98.2 F (36.8 C)  TempSrc: Oral  Oral Oral  SpO2: 93% 93% 100% 97%  Weight:      Height:        Intake/Output Summary (Last 24 hours) at 04/30/2022 0754 Last data filed at 04/30/2022 0530 Gross per 24 hour  Intake 570 ml  Output 2175 ml  Net -1605 ml      04/23/2022   12:52 PM 04/23/2022   11:16 AM 04/03/2022    1:52 PM  Last 3 Weights  Weight (lbs) 120 lb 1.6 oz 121 lb 6.4 oz 132 lb  Weight (kg) 54.477 kg 55.067 kg 59.875 kg      Telemetry    Sinus with paroxysms of AT - Personally Reviewed  ECG    Personally Reviewed  Physical Exam   GEN: No acute distress.  Elderly. Neck: No JVD Cardiac: RRR, no murmurs, rubs, or gallops.  Respiratory: Clear to auscultation bilaterally. GI: Soft, nontender, non-distended  MS: No edema; No deformity. Neuro:  Nonfocal  Psych:  Calm this morning. Awake and appropriate.  Labs    High Sensitivity Troponin:   Recent Labs  Lab 04/28/22 1302  TROPONINIHS 10     Chemistry Recent Labs  Lab 04/28/22 0721 04/29/22 0555 04/30/22 0132  NA 131* 132* 128*  K 4.5 4.6 4.3  CL 99 95* 94*  CO2 21* 22 22  GLUCOSE 118* 99 92  BUN 29* 23 19  CREATININE 1.05* 0.98 0.87  CALCIUM 9.2 9.4 8.9  MG 1.7 2.0 1.6*  GFRNONAA 55* 59* >60  ANIONGAP 11 15 12     Lipids No results for input(s): "CHOL", "TRIG", "HDL", "LABVLDL", "LDLCALC", "CHOLHDL" in the last 168 hours.  Hematology Recent Labs  Lab 04/27/22 0726  WBC 6.6  RBC 2.98*  2.99*  HGB 9.7*  HCT 28.0*  MCV 94.0  MCH 32.6  MCHC 34.6  RDW 13.3  PLT 199   Thyroid No results for input(s): "TSH", "FREET4" in the last 168 hours.  BNPNo results for input(s): "BNP", "PROBNP" in the last 168 hours.  DDimer No results for input(s): "DDIMER" in the last 168 hours.   Radiology    ECHOCARDIOGRAM LIMITED  Result Date: 04/29/2022    ECHOCARDIOGRAM  LIMITED REPORT   Patient Name:   Marissa [name] Date of Exam: 04/29/2022 Medical Rec #:  [id number]       Height:       64.0 in Accession #:    [id number]      Weight:       120.1 lb Date of Birth:  Jan 21, 1946       BSA:          1.575 m Patient Age:    2 years        BP:           143/65 mmHg Patient Gender: F               HR:           54 bpm. Exam Location:  Inpatient Procedure: Limited Echo, Cardiac Doppler and Color Doppler Indications:    R00.9* Unspecified abnormalities of heart beat. Cardiac arrest  History:        Patient has prior history of Echocardiogram examinations, most                 recent 08/08/2021. Abnormal ECG, Stroke, Arrythmias:Cardiac                 Arrest and Atrial Fibrillation; Signs/Symptoms:Dyspnea and                 Altered Mental Status.  Sonographer:    Roseanna Rainbow RDCS Referring Phys: Baldwin Jamaica  Sonographer Comments: Limited echo for LV EF. IMPRESSIONS  1. Limited study to assess LV function;  full doppler not performed.  2. Left ventricular ejection fraction, by estimation, is 60 to 65%. The left ventricle has normal function. The left ventricle has no regional wall motion abnormalities. There is mild left ventricular hypertrophy.  3. Right ventricular systolic function is moderately reduced. The right ventricular size is mildly enlarged.  4. Left atrial size was mildly dilated.  5. Right atrial size was mildly dilated.  6. The mitral valve is normal in structure.  7. The aortic valve is bicuspid.  8. Aortic dilatation noted. There is mild dilatation of the ascending aorta, measuring 43 mm.  9. The inferior vena cava is normal in size with greater than 50% respiratory variability, suggesting right atrial pressure of 3 mmHg. FINDINGS  Left Ventricle: Left ventricular ejection fraction, by estimation, is 60 to 65%. The left ventricle has normal function. The left ventricle has no regional wall motion abnormalities. The left ventricular internal cavity size was normal in size. There is  mild left ventricular hypertrophy. Right Ventricle: The right ventricular size is mildly enlarged. Right ventricular systolic function is moderately reduced. The tricuspid regurgitant velocity is 2.68 m/s, and with an assumed right atrial pressure of 8 mmHg, the estimated right ventricular systolic pressure is 91.7 mmHg. Left Atrium: Left atrial size was mildly dilated. Right Atrium: Right atrial size was mildly dilated. Pericardium: There is no evidence of pericardial effusion. Mitral Valve: The mitral valve is normal in structure. Tricuspid Valve: The tricuspid valve is normal in structure. Tricuspid valve regurgitation is mild. Aortic Valve: The aortic valve is bicuspid. Pulmonic Valve: The pulmonic valve was grossly normal. Pulmonic valve regurgitation is trivial. Aorta: Aortic dilatation noted. There is mild dilatation of the ascending aorta, measuring 43 mm. Venous: The inferior vena cava is normal in size with greater  than 50% respiratory variability, suggesting right atrial pressure of 3 mmHg. Additional Comments: Limited study to assess LV function; full doppler not performed.  LEFT VENTRICLE PLAX 2D LVIDd:         3.30 cm LVIDs:         2.40 cm LV PW:         1.20 cm LV IVS:        1.20 cm  LV Volumes (MOD) LV vol d, MOD A2C: 49.4 ml LV vol d, MOD A4C: 33.6 ml LV vol s, MOD A2C: 18.0 ml LV vol s, MOD A4C: 15.4 ml LV SV MOD A2C:     31.4 ml LV SV MOD A4C:     33.6 ml LV SV MOD BP:      25.2 ml LEFT ATRIUM         Index LA diam:    2.50 cm 1.59 cm/m   AORTA Ao Root diam: 3.30 cm Ao Asc diam:  4.30 cm TRICUSPID VALVE TR Peak grad:   28.7 mmHg TR Vmax:        268.00 cm/s Kirk Ruths MD Electronically signed by Kirk Ruths MD Signature Date/Time: 04/29/2022/1:17:39 PM    Final    CT HEAD WO CONTRAST (5MM)  Result Date: 04/28/2022 CLINICAL DATA:  Neuro deficit, acute, stroke suspected. Altered mental status. EXAM: CT HEAD WITHOUT CONTRAST TECHNIQUE: Contiguous axial images were obtained from the base of the skull through the vertex without intravenous contrast. RADIATION DOSE REDUCTION: This exam was performed according to the departmental dose-optimization program which includes automated exposure control, adjustment of the mA and/or kV according to patient size and/or use of iterative reconstruction technique. COMPARISON:  Without contrast 08/06/2021. MR head without contrast 08/06/2021. FINDINGS: Brain: Expected evolution of right parietal infarct noted. Mild atrophy and white matter changes are otherwise stable. Basal ganglia are intact. Insular ribbon is normal. No other acute or focal cortical abnormalities are present. The brainstem and cerebellum are within normal limits. Vascular: Atherosclerotic calcifications are again seen within the cavernous internal carotid arteries. No hyperdense vessel is present. Skull: Calvarium is intact. No focal lytic or blastic lesions are present. No significant extracranial soft  tissue lesion is present. Sinuses/Orbits: The paranasal sinuses and mastoid air cells are clear. Bilateral lens replacements are noted. Globes and orbits are otherwise unremarkable. IMPRESSION: 1. Expected evolution of right parietal infarct. 2. Stable atrophy and white matter disease. 3. No acute intracranial abnormality or significant interval change. Electronically Signed   By: San Morelle M.D.   On: 04/28/2022 17:19      Assessment & Plan    76 y.o. 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 Admitted for dofetilide load given symptomatic AF. Dofetilide load complicated by Torsades/VF with brief CPR. Post arrest, patient with worsening delirium superimposed on her baseline dementia.  #Persistent AF Maintaining SR Continue xarelto  #PMVT #TdP Failed dofetilide. Avoid QT prolonging medications.  #Delirium Superimposed on baseline dementia. Appreciate internal medicine consultation.  #urinary retention Appreciate internal medicine consultation. Foley recommended with outpatient urology follow up.  Awaiting rehab placement. Medically stable.   For questions or updates, please contact [place] Please consult [email or web address] for contact info under        Signed, [name], MD  04/30/2022, 7:54 AM

## 2022-04-30 NOTE — Progress Notes (Signed)
Inpatient Rehab Admissions Coordinator:    Pt. Has SNF bed at [facility], her preferred facility. CIR will sign off.   Clemens Catholic, [place], [place] Admissions Coordinator  [phone] (Susan Moore) [phone] (office)

## 2022-05-01 DIAGNOSIS — I469 Cardiac arrest, cause unspecified: Secondary | ICD-10-CM | POA: Diagnosis not present

## 2022-05-01 LAB — BASIC METABOLIC PANEL
Anion gap: 13 (ref 5–15)
BUN: 14 mg/dL (ref 8–23)
CO2: 21 mmol/L — ABNORMAL LOW (ref 22–32)
Calcium: 8.9 mg/dL (ref 8.9–10.3)
Chloride: 98 mmol/L (ref 98–111)
Creatinine, Ser: 0.75 mg/dL (ref 0.44–1.00)
GFR, Estimated: >60 mL/min (ref >60)
Glucose, Bld: 99 mg/dL (ref 70–99)
Potassium: 4.6 mmol/L (ref 3.5–5.1)
Sodium: 132 mmol/L — ABNORMAL LOW (ref 135–145)

## 2022-05-01 LAB — MAGNESIUM: Magnesium: 1.8 mg/dL (ref 1.7–2.4)

## 2022-05-01 MED ORDER — POLYETHYLENE GLYCOL 3350 17 G PO PACK
17.0000 g | PACK | Freq: Every day | ORAL | Status: DC
  Administered 2022-05-01 – 2022-05-02 (×2): 17 g via ORAL
  Filled 2022-05-01 (×2): qty 1

## 2022-05-01 MED ORDER — MAGNESIUM SULFATE 2 GM/50ML IV SOLN
2.0000 g | Freq: Once | INTRAVENOUS | Status: DC
Start: 2022-05-01 — End: 2022-05-01

## 2022-05-01 MED ORDER — SENNOSIDES-DOCUSATE SODIUM 8.6-50 MG PO TABS
1.0000 | ORAL_TABLET | Freq: Two times a day (BID) | ORAL | Status: DC
  Administered 2022-05-01 – 2022-05-02 (×3): 1 via ORAL
  Filled 2022-05-01 (×3): qty 1

## 2022-05-01 MED ORDER — LIDOCAINE 5 % EX PTCH
1.0000 | MEDICATED_PATCH | CUTANEOUS | Status: DC
  Administered 2022-05-01: 1 via TRANSDERMAL
  Filled 2022-05-01: qty 1

## 2022-05-01 NOTE — Care Management Important Message (Signed)
Important Message  Patient Details  Name: Sunnie Odden MRN: [id number] Date of Birth: [birth year]   Medicare Important Message Given:  Yes     Shelda Altes 05/01/2022, 8:26 AM

## 2022-05-01 NOTE — Progress Notes (Signed)
Progress Note  Patient Name: Marissa [name] Date of Encounter: 05/01/2022  [facility] Cardiologist: [name], MD   Subjective   " I feel bad", poor appetite, weak.  No CP, not SOB, husband is at bedside, doesn't think she is ready to leave yet.  Inpatient Medications    Scheduled Meds:  acetaminophen  500 mg Oral Q6H   Chlorhexidine Gluconate Cloth  6 each Topical Daily   hydrocortisone cream   Topical TID   levothyroxine  125 mcg Oral Q0600   melatonin  5 mg Oral QHS   montelukast  10 mg Oral QHS   nebivolol  10 mg Oral Daily   QUEtiapine  25 mg Oral QHS   rivaroxaban  15 mg Oral Q supper   sodium chloride flush  3 mL Intravenous Q12H   tamsulosin  0.4 mg Oral QPC supper   Continuous Infusions:  magnesium sulfate bolus IVPB     PRN Meds: acetaminophen, cloNIDine, metoprolol tartrate, oxyCODONE, sodium chloride flush, trimethobenzamide, zolpidem   Vital Signs    Vitals:   04/30/22 0511 04/30/22 1420 04/30/22 2045 05/01/22 0502  BP: (!) 189/85 (!) 150/77 (!) 181/79 (!) 156/71  Pulse: 69 70 62 64  Resp: 15 17 18 19   Temp: 98.2 F (36.8 C) 97.9 F (36.6 C) 98.4 F (36.9 C) 98.3 F (36.8 C)  TempSrc: Oral Oral Oral Oral  SpO2: 97%  98% 98%  Weight:      Height:        Intake/Output Summary (Last 24 hours) at 05/01/2022 0919 Last data filed at 05/01/2022 0520 Gross per 24 hour  Intake 900 ml  Output 1500 ml  Net -600 ml      04/23/2022   12:52 PM 04/23/2022   11:16 AM 04/03/2022    1:52 PM  Last 3 Weights  Weight (lbs) 120 lb 1.6 oz 121 lb 6.4 oz 132 lb  Weight (kg) 54.477 kg 55.067 kg 59.875 kg      Telemetry    SR generally  60's, rare brief AT 120's - Personally Reviewed  ECG    No new EKGs   - Personally Reviewed  Physical Exam   GEN: No acute distress.   Neck: No JVD Cardiac: RRR, no murmurs, rubs, or gallops.  Respiratory: CTA b/l. GI: Soft, nontender, non-distended  MS: No edema; No deformity. Neuro: oriented to self, no gross  focal motor deficits  Psych: very pleasant and cooperative  Labs    High Sensitivity Troponin:   Recent Labs  Lab 04/28/22 1302  TROPONINIHS 10     Chemistry Recent Labs  Lab 04/29/22 0555 04/30/22 0132 05/01/22 0032  NA 132* 128* 132*  K 4.6 4.3 4.6  CL 95* 94* 98  CO2 22 22 21*  GLUCOSE 99 92 99  BUN 23 19 14   CREATININE 0.98 0.87 0.75  CALCIUM 9.4 8.9 8.9  MG 2.0 1.6* 1.8  GFRNONAA 59* >60 >60  ANIONGAP 15 12 13     Lipids No results for input(s): "CHOL", "TRIG", "HDL", "LABVLDL", "LDLCALC", "CHOLHDL" in the last 168 hours.  Hematology Recent Labs  Lab 04/27/22 0726  WBC 6.6  RBC 2.98*  2.99*  HGB 9.7*  HCT 28.0*  MCV 94.0  MCH 32.6  MCHC 34.6  RDW 13.3  PLT 199   Thyroid  Recent Labs  Lab 04/29/22 0555  TSH 0.477    BNPNo results for input(s): "BNP", "PROBNP" in the last 168 hours.  DDimer No results for input(s): "  DDIMER" in the last 168 hours.   Radiology    CT HEAD WO CONTRAST (5MM) Result Date: 04/28/2022 CLINICAL DATA:  Neuro deficit, acute, stroke suspected. Altered mental status. EXAM: CT HEAD WITHOUT CONTRAST TECHNIQUE: Contiguous axial images were obtained from the base of the skull through the vertex without intravenous contrast. RADIATION DOSE REDUCTION: This exam was performed according to the departmental dose-optimization program which includes automated exposure control, adjustment of the mA and/or kV according to patient size and/or use of iterative reconstruction technique. COMPARISON:  Without contrast 08/06/2021. MR head without contrast 08/06/2021. FINDINGS: Brain: Expected evolution of right parietal infarct noted. Mild atrophy and white matter changes are otherwise stable. Basal ganglia are intact. Insular ribbon is normal. No other acute or focal cortical abnormalities are present. The brainstem and cerebellum are within normal limits. Vascular: Atherosclerotic calcifications are again seen within the cavernous internal carotid  arteries. No hyperdense vessel is present. Skull: Calvarium is intact. No focal lytic or blastic lesions are present. No significant extracranial soft tissue lesion is present. Sinuses/Orbits: The paranasal sinuses and mastoid air cells are clear. Bilateral lens replacements are noted. Globes and orbits are otherwise unremarkable. IMPRESSION: 1. Expected evolution of right parietal infarct. 2. Stable atrophy and white matter disease. 3. No acute intracranial abnormality or significant interval change. Electronically Signed   By: San Morelle M.D.   On: 04/28/2022 17:19    Cardiac Studies    08/08/21: TTE  1. Left ventricular ejection fraction, by estimation, is 60 to 65%. The  left ventricle has normal function. The left ventricle has no regional  wall motion abnormalities. Left ventricular diastolic parameters were  normal.   2. Right ventricular systolic function is normal. The right ventricular  size is normal. There is normal pulmonary artery systolic pressure.   3. The mitral valve is abnormal. Trivial mitral valve regurgitation. No  evidence of mitral stenosis.   4. ? functoinally bicuspid with poor visualization of commisure between  right and left cusp Non coronary cusp very sclerotic Gradients stable  since TTE done 08/15/20. The aortic valve is tricuspid. There is moderate  calcification of the aortic valve.  Aortic valve regurgitation is not visualized. Mild aortic valve stenosis.   5. Aortic root stable since TTE done 08/15/20 . Aortic dilatation noted.  There is moderate dilatation of the ascending aorta, measuring 42 mm.   6. The inferior vena cava is normal in size with greater than 50%  respiratory variability, suggesting right atrial pressure of 3 mmHg.   Patient Profile     76 y.o. female  HTN, HLD, chronic fatigue syndrome, anxiety/depression, dementia, suffered a stroke Oct 2022 > loop > Afib  Admitted for Tikosyn load complicated by Torsades de pointes / VF /  PMVT she had brief CPR > ICU > back to 6E, post arrest delirium   Assessment & Plan    Persistent Afib CHA2DS2Vasc is 6, on Xarelto, appropriately dosed for calcCrCl 41 Maintaining SR Failed Tikosyn  2.  Cardiac arrest 2/2 Tikosyn + CPR, no shock  3. Abnormal EKG 7/1 Chest wall pain HS trop neg In review of notes over the weekend With waxing/waning delirium trazodone was added, (AFTER the EKG) Though with her very abnormal EKG and torsades with Tikosyn, Avoid QT prolonging drugs  EKG remains abnormal Echo looked OK with preserved LVEF, RV mildly depressed   4. Delirium I am uncertain of her baseline, though in review of notes, appears that she has some degree of baseline dementia and  confusion perhaps Waxing/waning near baseline at times by notes. As above I worry about trazodone In Dr. Olin Pia weekend notes, d/w neurology, CT scan negative afor anything new Did not think she would be able to do an MRI  Recs for CIR by PT, pt/family prefer SNF ([facility], has a bed)  Appreciate IM    5. Urinary retention Appreciate IM Will need urologist out pt   6. HTN I think this can be followed further out patient  7. Hyponatremia Better today Again, appreciate IM   She is ready to go from EP/cardiac perspective Husband thinks she looks weaker, poor appetite Encourage OOB and oral intake Hopefully to SNF tomorrow She has a bed at [facility]   For questions or updates, please contact [place] Please consult [email or web address] for contact info under        Signed, Baldwin Jamaica, PA-C  05/01/2022, 9:19 AM

## 2022-05-01 NOTE — TOC Progression Note (Signed)
Transition of Care [facility]) - Progression Note    Patient Details  Name: Marissa [name] MRN: [id number] Date of Birth: [birth year]  Transition of Care [facility]) CM/SW [place], [place] Phone Number: 05/01/2022, 10:25 AM  Clinical Narrative:     Patient has SNF bed at [facility] when medically ready. Insurance authorization has been approved. CSW will continue to follow and assist with patients dc planning needs.  Expected Discharge Plan: [place] Barriers to Discharge: SNF Pending bed offer, Continued Medical Work up, Ship broker  Expected Discharge Plan and Services Expected Discharge Plan: [place] arrangements for the past 2 months: Single Family Home                                       Social Determinants of Health (SDOH) Interventions    Readmission Risk Interventions     No data to display

## 2022-05-01 NOTE — Progress Notes (Addendum)
[facility] consult progress note                                                                              Marissa [name], is a 76 y.o. female, DOB - 1946-05-23, [id number] Admit date - 04/23/2022    Outpatient Primary MD for the patient is [name], MD  LOS - 8  days  No chief complaint on file.      Brief summary   Admitted for Tikosyn and suffered VF arrest on 6/28.  Confusion, unsure how acute.  Head CT negative on the weekend.  QT is very long, was on trazodone and this was stopped.  I/O cath also done intermittently. [facility] consulted for management of delirium superimposed on dementia, urinary retention  Assessment & Plan    Principal Problem:   Cardiac arrest ([place]), prolonged QT interval -Management per EP cardiology, primary team  Active Problems: Delirium superimposed on dementia -reported had significant sundowning -this am she is calm and cooperative but not oriented to time - underlying dementia with h/o CVA, CT head showed " h/o right parietal infarct" no acute findings -Agree with Seroquel 25 mg at bedtime, if needed for agitation, titrate up to twice daily -Avoid BZD's or Haldol (prolonged QTc) -No fevers, leukocytosis, dysuria/hematuria or any other signs of infection     Acute urinary retention -foley inserted this hospitalization -increase activity, avoid constipation -Continue Flomax, will need outpatient urology follow-up for voiding trial in 1-2 wks    Persistent atrial fibrillation ([place]): CHA2DS2-VASc Score = 7, hypertension, HLP   -Per cardiology, primary team    Hypothyroidism -Continue Synthroid  hypomagnesemia -Replaced  Mild hyponatremia -with poor oral intake, negative fluids balance -likely from dehydration Encourage oral intake  Constipation No bm documented since admission Start stool softener    Code Status: Full code DVT Prophylaxis:   Rivaroxaban (XARELTO) tablet 15 mg   Level of Care:  Level of care: Progressive Family Communication: Updated patient's husband at the bedside full code   Disposition Plan:    Per primary team   Medications  acetaminophen  500 mg Oral Q6H   Chlorhexidine Gluconate Cloth  6 each Topical Daily   hydrocortisone cream   Topical TID   levothyroxine  125 mcg Oral Q0600   melatonin  5 mg Oral QHS   montelukast  10 mg Oral QHS   nebivolol  10 mg Oral Daily   polyethylene glycol  17 g Oral Daily   QUEtiapine  25 mg Oral QHS   rivaroxaban  15 mg Oral Q supper   senna-docusate  1 tablet Oral BID   sodium chloride flush  3 mL Intravenous Q12H   tamsulosin  0.4 mg Oral QPC supper      Subjective:   Marissa [name] was seen and examined today.   Currently alert and oriented to place and person, appropriately able to answer questions.   Appear weak, husband at bedside  No bm since in the hospital, foley in place with clear urine  Denies any dizziness, chest pain, shortness of breath, abdominal pain, nausea vomiting.  No acute events overnight. Objective:  Vitals:   04/30/22 0511 04/30/22 1420 04/30/22 2045 05/01/22 0502  BP: (!) 189/85 (!) 150/77 (!) 181/79 (!) 156/71  Pulse: 69 70 62 64  Resp: 15 17 18 19   Temp: 98.2 F (36.8 C) 97.9 F (36.6 C) 98.4 F (36.9 C) 98.3 F (36.8 C)  TempSrc: Oral Oral Oral Oral  SpO2: 97%  98% 98%  Weight:      Height:        Intake/Output Summary (Last 24 hours) at 05/01/2022 1009 Last data filed at 05/01/2022 0520 Gross per 24 hour  Intake 900 ml  Output 1500 ml  Net -600 ml     Wt Readings from Last 3 Encounters:  04/23/22 54.5 kg  04/23/22 55.1 kg  04/03/22 59.9 kg     Exam General: Alert and oriented x place and person, knows she is in [facility] in [place].  Recognize to her family members pictures Cardiovascular: S1 S2 auscultated,  RRR Respiratory: Clear to auscultation bilaterally Gastrointestinal: Soft, nontender, nondistended, + bowel sounds Ext: no pedal edema  bilaterally Neuro: moving all 4 extremities spontaneously Psych: has dementia, currently no agitation     Data Reviewed:  I have personally reviewed following labs    CBC Lab Results  Component Value Date   WBC 6.6 04/27/2022   RBC 2.98 (L) 04/27/2022   RBC 2.99 (L) 04/27/2022   HGB 9.7 (L) 04/27/2022   HCT 28.0 (L) 04/27/2022   MCV 94.0 04/27/2022   MCH 32.6 04/27/2022   PLT 199 04/27/2022   MCHC 34.6 04/27/2022   RDW 13.3 04/27/2022   LYMPHSABS 0.7 04/27/2022   MONOABS 0.8 04/27/2022   EOSABS 0.1 04/27/2022   BASOSABS 0.0 35/36/1443     Last metabolic panel Lab Results  Component Value Date   NA 132 (L) 05/01/2022   K 4.6 05/01/2022   CL 98 05/01/2022   CO2 21 (L) 05/01/2022   BUN 14 05/01/2022   CREATININE 0.75 05/01/2022   GLUCOSE 99 05/01/2022   GFRNONAA >60 05/01/2022   GFRAA 93 01/12/2020   CALCIUM 8.9 05/01/2022   PHOS 2.4 (L) 04/24/2022   PROT 6.6 01/28/2022   ALBUMIN 4.2 01/28/2022   LABGLOB 2.0 04/09/2021   AGRATIO 2.1 04/09/2021   BILITOT 0.4 01/28/2022   ALKPHOS 56 01/28/2022   AST 37 01/28/2022   ALT 26 01/28/2022   ANIONGAP 13 05/01/2022    CBG (last 3)  No results for input(s): "GLUCAP" in the last 72 hours.    Coagulation Profile: No results for input(s): "INR", "PROTIME" in the last 168 hours.   Radiology Studies: I have personally reviewed the imaging studies  ECHOCARDIOGRAM LIMITED  Result Date: 04/29/2022    ECHOCARDIOGRAM LIMITED REPORT   Patient Name:   Marissa [name] Date of Exam: 04/29/2022 Medical Rec #:  [id number]       Height:       64.0 in Accession #:    [id number]      Weight:       120.1 lb Date of Birth:  [birth year]       BSA:          1.575 m Patient Age:    24 years        BP:           143/65 mmHg Patient Gender: F               HR:           54 bpm. Exam  Location:  Inpatient Procedure: Limited Echo, Cardiac Doppler and Color Doppler Indications:    R00.9* Unspecified abnormalities of heart beat. Cardiac arrest   History:        Patient has prior history of Echocardiogram examinations, most                 recent 08/08/2021. Abnormal ECG, Stroke, Arrythmias:Cardiac                 Arrest and Atrial Fibrillation; Signs/Symptoms:Dyspnea and                 Altered Mental Status.  Sonographer:    Roseanna Rainbow RDCS Referring Phys: [place]  Sonographer Comments: Limited echo for LV EF. IMPRESSIONS  1. Limited study to assess LV function; full doppler not performed.  2. Left ventricular ejection fraction, by estimation, is 60 to 65%. The left ventricle has normal function. The left ventricle has no regional wall motion abnormalities. There is mild left ventricular hypertrophy.  3. Right ventricular systolic function is moderately reduced. The right ventricular size is mildly enlarged.  4. Left atrial size was mildly dilated.  5. Right atrial size was mildly dilated.  6. The mitral valve is normal in structure.  7. The aortic valve is bicuspid.  8. Aortic dilatation noted. There is mild dilatation of the ascending aorta, measuring 43 mm.  9. The inferior vena cava is normal in size with greater than 50% respiratory variability, suggesting right atrial pressure of 3 mmHg. FINDINGS  Left Ventricle: Left ventricular ejection fraction, by estimation, is 60 to 65%. The left ventricle has normal function. The left ventricle has no regional wall motion abnormalities. The left ventricular internal cavity size was normal in size. There is  mild left ventricular hypertrophy. Right Ventricle: The right ventricular size is mildly enlarged. Right ventricular systolic function is moderately reduced. The tricuspid regurgitant velocity is 2.68 m/s, and with an assumed right atrial pressure of 8 mmHg, the estimated right ventricular systolic pressure is 71.6 mmHg. Left Atrium: Left atrial size was mildly dilated. Right Atrium: Right atrial size was mildly dilated. Pericardium: There is no evidence of pericardial effusion. Mitral Valve: The  mitral valve is normal in structure. Tricuspid Valve: The tricuspid valve is normal in structure. Tricuspid valve regurgitation is mild. Aortic Valve: The aortic valve is bicuspid. Pulmonic Valve: The pulmonic valve was grossly normal. Pulmonic valve regurgitation is trivial. Aorta: Aortic dilatation noted. There is mild dilatation of the ascending aorta, measuring 43 mm. Venous: The inferior vena cava is normal in size with greater than 50% respiratory variability, suggesting right atrial pressure of 3 mmHg. Additional Comments: Limited study to assess LV function; full doppler not performed. LEFT VENTRICLE PLAX 2D LVIDd:         3.30 cm LVIDs:         2.40 cm LV PW:         1.20 cm LV IVS:        1.20 cm  LV Volumes (MOD) LV vol d, MOD A2C: 49.4 ml LV vol d, MOD A4C: 33.6 ml LV vol s, MOD A2C: 18.0 ml LV vol s, MOD A4C: 15.4 ml LV SV MOD A2C:     31.4 ml LV SV MOD A4C:     33.6 ml LV SV MOD BP:      25.2 ml LEFT ATRIUM         Index LA diam:    2.50 cm 1.59 cm/m   AORTA Ao Root  diam: 3.30 cm Ao Asc diam:  4.30 cm TRICUSPID VALVE TR Peak grad:   28.7 mmHg TR Vmax:        268.00 cm/s Kirk Ruths MD Electronically signed by Kirk Ruths MD Signature Date/Time: 04/29/2022/1:17:39 PM    Final        Florencia Reasons M.D. PhD FACP [facility] 05/01/2022, 10:09 AM  Available via Epic secure chat 7am-7pm After 7 pm, please refer to night coverage provider listed on amion.

## 2022-05-01 NOTE — Progress Notes (Signed)
Physical Therapy Treatment Patient Details Name: Marissa [name] MRN: [id number] DOB: January 02, 1946 Today's Date: 05/01/2022   History of Present Illness 76 y.o. female presents to [facility] hospital on 04/23/2022 with persistent afibm for dofetilide admission. Pt with episode of torsades on 6/28 requiring 2 minutes of CPR. Pt developed delirium during admission. PMH includes HTN, aortic stenosis, HLD, CVA.    PT Comments    Pt tolerates therapy well today, ambulating limited community distances with a rollator. Pt requires verbal cues to manage rollator brakes and is able to remember and attempt them throughout the session, however she has difficulty figuring out how they work with each attempt. Pt shows continued cognitive deficits, especially when being asked more cognitively challenging questions during ambulation (unable to answer correctly and tends to slow her gait). Continued acute therapy is recommended as the Pt is making significant progress. Continued therapy will assist her in improving activity tolerance, strength, and cognition so that ambulation and ADLs are safer and more efficient for the Pt, as she is currently at an increased risk of falling.   Recommendations for follow up therapy are one component of a multi-disciplinary discharge planning process, led by the attending physician.  Recommendations may be updated based on patient status, additional functional criteria and insurance authorization.  Follow Up Recommendations  Skilled nursing-short term rehab (<3 hours/day) Can patient physically be transported by private vehicle: Yes   Assistance Recommended at Discharge Frequent or constant Supervision/Assistance  Patient can return home with the following A little help with walking and/or transfers;A lot of help with bathing/dressing/bathroom;Assistance with cooking/housework;Assistance with feeding;Direct supervision/assist for medications management;Direct supervision/assist for financial  management;Assist for transportation;Help with stairs or ramp for entrance   Equipment Recommendations  None recommended by PT    Recommendations for Other Services       Precautions / Restrictions Precautions Precautions: Fall Restrictions Weight Bearing Restrictions: No     Mobility  Bed Mobility Overal bed mobility: Needs Assistance Bed Mobility: Supine to Sit     Supine to sit: HOB elevated, Supervision          Transfers Overall transfer level: Needs assistance Equipment used: Rollator (4 wheels) Transfers: Sit to/from Stand Sit to Stand: Min guard           General transfer comment: Pt given verbal cues for using rollator brakes    Ambulation/Gait Ambulation/Gait assistance: Min guard Gait Distance (Feet): 600 Feet Assistive device: Rollator (4 wheels) Gait Pattern/deviations: Step-to pattern, Decreased step length - right, Decreased step length - left, Decreased stride length Gait velocity: decreased Gait velocity interpretation: <1.31 ft/sec, indicative of household ambulator   General Gait Details: Pt slows gait velocity when being asked more cognitively demanding questions; Pt reports some dizziness during ambulation - BP taken ~134/100   Stairs             Wheelchair Mobility    Modified [place] (Stroke Patients Only)       Balance Overall balance assessment: Needs assistance Sitting-balance support: Feet supported, No upper extremity supported Sitting balance-Leahy Scale: Fair     Standing balance support: Bilateral upper extremity supported, Reliant on assistive device for balance Standing balance-Leahy Scale: Poor                              Cognition Arousal/Alertness: Awake/alert Behavior During Therapy: WFL for tasks assessed/performed Overall Cognitive Status: Impaired/Different from baseline Area of Impairment: Orientation, Attention, Memory, Following commands, Safety/judgement,  Awareness, Problem  solving                 Orientation Level: Disoriented to, Time Current Attention Level: Focused Memory: Decreased recall of precautions, Decreased short-term memory Following Commands: Follows multi-step commands inconsistently Safety/Judgement: Decreased awareness of safety Awareness: Emergent Problem Solving: Requires verbal cues, Difficulty sequencing General Comments: Pt seems to be somewhat improving cognitively, but signifcant deficits still present. Pt can remember verbal cues for using brakes on rollator but has trouble with remembering how they work        Exercises      General Comments General comments (skin integrity, edema, etc.): VSS on RA      Pertinent Vitals/Pain Pain Assessment Pain Assessment: Faces Faces Pain Scale: Hurts even more Pain Location: chest Pain Descriptors / Indicators: Other (Comment) (Pinchy; Deep) Pain Intervention(s): Monitored during session    Home Living                          Prior Function            PT Goals (current goals can now be found in the care plan section) Acute Rehab PT Goals Patient Stated Goal: to return to prior level of function PT Goal Formulation: With patient/family Time For Goal Achievement: 05/11/22 Potential to Achieve Goals: Fair Progress towards PT goals: Progressing toward goals    Frequency    Min 3X/week      PT Plan Current plan remains appropriate    Co-evaluation              AM-PAC PT "6 Clicks" Mobility   Outcome Measure  Help needed turning from your back to your side while in a flat bed without using bedrails?: A Little Help needed moving from lying on your back to sitting on the side of a flat bed without using bedrails?: A Little Help needed moving to and from a bed to a chair (including a wheelchair)?: A Little Help needed standing up from a chair using your arms (e.g., wheelchair or bedside chair)?: A Little Help needed to walk in hospital room?: A  Little Help needed climbing 3-5 steps with a railing? : Total 6 Click Score: 16    End of Session Equipment Utilized During Treatment: Gait belt Activity Tolerance: Patient tolerated treatment well Patient left: in chair;with call bell/phone within reach;with nursing/sitter in room;with family/visitor present Nurse Communication: Mobility status PT Visit Diagnosis: Other abnormalities of gait and mobility (R26.89);Muscle weakness (generalized) (M62.81);Other symptoms and signs involving the nervous system (R29.898)     Time: 6503-5465 PT Time Calculation (min) (ACUTE ONLY): 36 min  Charges:  $Gait Training: 8-22 mins $Therapeutic Activity: 8-22 mins                     Hall Busing, SPT Acute Rehabilitation Office #: [phone]    Hall Busing 05/01/2022, 4:23 PM

## 2022-05-02 DIAGNOSIS — I469 Cardiac arrest, cause unspecified: Secondary | ICD-10-CM | POA: Diagnosis not present

## 2022-05-02 DIAGNOSIS — F339 Major depressive disorder, recurrent, unspecified: Secondary | ICD-10-CM | POA: Diagnosis not present

## 2022-05-02 DIAGNOSIS — F5101 Primary insomnia: Secondary | ICD-10-CM | POA: Diagnosis not present

## 2022-05-02 DIAGNOSIS — Z466 Encounter for fitting and adjustment of urinary device: Secondary | ICD-10-CM | POA: Diagnosis not present

## 2022-05-02 DIAGNOSIS — F32A Depression, unspecified: Secondary | ICD-10-CM | POA: Diagnosis not present

## 2022-05-02 DIAGNOSIS — I69398 Other sequelae of cerebral infarction: Secondary | ICD-10-CM | POA: Diagnosis not present

## 2022-05-02 DIAGNOSIS — I10 Essential (primary) hypertension: Secondary | ICD-10-CM | POA: Diagnosis not present

## 2022-05-02 DIAGNOSIS — R339 Retention of urine, unspecified: Secondary | ICD-10-CM | POA: Diagnosis not present

## 2022-05-02 DIAGNOSIS — R270 Ataxia, unspecified: Secondary | ICD-10-CM | POA: Diagnosis not present

## 2022-05-02 DIAGNOSIS — I350 Nonrheumatic aortic (valve) stenosis: Secondary | ICD-10-CM | POA: Diagnosis not present

## 2022-05-02 DIAGNOSIS — I4819 Other persistent atrial fibrillation: Secondary | ICD-10-CM | POA: Diagnosis not present

## 2022-05-02 DIAGNOSIS — R531 Weakness: Secondary | ICD-10-CM | POA: Diagnosis not present

## 2022-05-02 DIAGNOSIS — Z8674 Personal history of sudden cardiac arrest: Secondary | ICD-10-CM | POA: Diagnosis not present

## 2022-05-02 DIAGNOSIS — I6389 Other cerebral infarction: Secondary | ICD-10-CM | POA: Diagnosis not present

## 2022-05-02 DIAGNOSIS — F05 Delirium due to known physiological condition: Secondary | ICD-10-CM | POA: Diagnosis not present

## 2022-05-02 DIAGNOSIS — M6281 Muscle weakness (generalized): Secondary | ICD-10-CM | POA: Diagnosis not present

## 2022-05-02 DIAGNOSIS — E46 Unspecified protein-calorie malnutrition: Secondary | ICD-10-CM | POA: Diagnosis not present

## 2022-05-02 DIAGNOSIS — F419 Anxiety disorder, unspecified: Secondary | ICD-10-CM | POA: Diagnosis not present

## 2022-05-02 DIAGNOSIS — F03B2 Unspecified dementia, moderate, with psychotic disturbance: Secondary | ICD-10-CM | POA: Diagnosis not present

## 2022-05-02 DIAGNOSIS — R410 Disorientation, unspecified: Secondary | ICD-10-CM | POA: Diagnosis not present

## 2022-05-02 DIAGNOSIS — R413 Other amnesia: Secondary | ICD-10-CM | POA: Diagnosis not present

## 2022-05-02 DIAGNOSIS — K5900 Constipation, unspecified: Secondary | ICD-10-CM | POA: Diagnosis not present

## 2022-05-02 DIAGNOSIS — R2689 Other abnormalities of gait and mobility: Secondary | ICD-10-CM | POA: Diagnosis not present

## 2022-05-02 DIAGNOSIS — I4901 Ventricular fibrillation: Secondary | ICD-10-CM | POA: Diagnosis not present

## 2022-05-02 LAB — MAGNESIUM: Magnesium: 1.4 mg/dL — ABNORMAL LOW (ref 1.7–2.4)

## 2022-05-02 MED ORDER — MAGNESIUM SULFATE 2 GM/50ML IV SOLN
2.0000 g | Freq: Once | INTRAVENOUS | Status: AC
Start: 2022-05-02 — End: 2022-05-02
  Administered 2022-05-02: 2 g via INTRAVENOUS
  Filled 2022-05-02: qty 50

## 2022-05-02 MED ORDER — MELATONIN 5 MG PO TABS: 5.0000 mg | ORAL_TABLET | Freq: Every day | ORAL | 0 refills | Status: DC

## 2022-05-02 MED ORDER — QUETIAPINE FUMARATE 25 MG PO TABS
25.0000 mg | ORAL_TABLET | Freq: Every day | ORAL | Status: DC
Start: 2022-05-02 — End: 2022-09-08

## 2022-05-02 MED ORDER — MAGNESIUM OXIDE 400 MG PO TABS: 400.0000 mg | ORAL_TABLET | Freq: Every day | ORAL | Status: DC

## 2022-05-02 MED ORDER — TAMSULOSIN HCL 0.4 MG PO CAPS: 0.4000 mg | ORAL_CAPSULE | Freq: Every day | ORAL | Status: DC

## 2022-05-02 MED ORDER — SENNOSIDES-DOCUSATE SODIUM 8.6-50 MG PO TABS
1.0000 | ORAL_TABLET | Freq: Two times a day (BID) | ORAL | Status: DC
Start: 2022-05-02 — End: 2022-05-23

## 2022-05-02 NOTE — Progress Notes (Signed)
Delirium superimposed on dementia -reported had significant sundowning -- underlying dementia with h/o CVA, CT head showed " h/o right parietal infarct" no acute findings -Agree with Seroquel 25 mg at bedtime, if needed for agitation, titrate up to twice daily -Avoid BZD's or Haldol (prolonged QTc) -No fevers, leukocytosis, dysuria/hematuria or any other signs of infection       Acute urinary retention -foley inserted this hospitalization -increase activity, avoid constipation -Continue Flomax, will need outpatient urology follow-up for voiding trial in 1-2 wks

## 2022-05-02 NOTE — Discharge Summary (Signed)
ELECTROPHYSIOLOGY PROCEDURE DISCHARGE SUMMARY    Patient ID: Marissa [name],  MRN: [id number], DOB/AGE: 05/17/1946 76 y.o.  Admit date: 04/23/2022 Discharge date: 05/02/2022  Primary Care Physician: [name], MD Primary Cardiologist: Dr. Ellyn Hack Electrophysiologist: Dr. [name]  Primary Discharge Diagnosis:  1.  persistent atrial fibrillation       CHA2DS2Vasc is 6, on Xarelto 2. Failed Tikosyn load with VF arrest 3. Delirium superimposed on baseline dementia  Secondary Discharge Diagnosis:  Stroke historical HTN HLD  Allergies  Allergen Reactions   Amiodarone Other (See Comments)    Nausea, ataxia, weakness and chills.  Chest pain, dyspnea; near syncope   Dofetilide Other (See Comments)    Torsades and cardiac arrest during dofetilide initiation   Fentanyl Shortness Of Breath and Rash   Rosuvastatin Anaphylaxis, Anxiety, Hives, Itching, Nausea And Vomiting, Other (See Comments), Palpitations, Photosensitivity and Shortness Of Breath   Amoxicillin Diarrhea    Severe diarrhea, rash to vaginal area with swelling    Hctz [Hydrochlorothiazide] Other (See Comments)    HypoNatremia   Hydralazine     Weak, sweats, red skin   Codeine Other (See Comments)    makes her hyper   Conjugated Estrogens Itching and Rash   Erythromycin Rash    had a rash with emycin, has done ok with other meds in it's class   Piroxicam Itching and Rash    Feldene     Procedures This Admission:  none.   Brief HPI: Marissa [name] is a 76 y.o. female with a past medical history as noted above.  She was admitted fdor Tikosyn initiation, complicated/failed with VF arrest.  Hospital Course:  The patient was admitted and Tikosyn was initiated.   She developed VF arrest that required brief CPR, no shock.  Post arrest noted in ICU intermittent AMS c/w delirium. She also c/o chest wall pain, xrays noted no fractures. She had progressive episodes of AMS CT done head without new or  acute findings, noting old stroke. Not felt able to tolerate MRI.  She developed T changes on her EKG, echo noted persevered LVEF with no WMA  with mod reduction in RVEF  Medicine team was consulted to help with her delirium and development of urinary retention. Ultimately foley was placed and started on bowel regimen for constipation.  She has ambulated and mobilized doing fairly well with the mobilization/PT team, though do recommend at least a brief rehab at [facility].  Initially CIR recommended though the patient and her husband requested [facility]   Her mental status waxes/wanes, IM felt that she  is today ready for discharge to SNF with foley and bowel regimen in place for SNF providers to follow. Recommends urology referral for voiding trial in 1-2 weeks.  On the day of discharge, she denies any CP, SOB, she is agitated, husband is at bedside. Delirium will not improve here and discussed this with them, the husband understands and the patient wants to leave as well. This should improve out of the hospital  She has been seen by Dr. [name], felt medically/cardiac-wise ready for discharge as well.   For the SNF: IM recommends Seroquel 82m at bedtime, can titrate to BID if needed for agitation AVOID QT PROLONGING AGENTS Increase activity Continue Flomax with a urology referral for voiding trial in 1-2 weeks Continue bowel regime  Senokot  Will add mag with recurrent low mag levels   EP follow up is in place In d//w IM, defer  appointment/referral to urology and PMD follow up to the SNF facility to arrange and follow up with.   Physical Exam: Vitals:   05/01/22 0502 05/01/22 1442 05/01/22 2000 05/02/22 0542  BP: (!) 156/71 119/76 (!) 160/73 (!) 181/87  Pulse: 64 63 63 65  Resp: 19 14 15    Temp: 98.3 F (36.8 C) (!) 97.5 F (36.4 C) 98.6 F (37 C) 98.6 F (37 C)  TempSrc: Oral Oral Oral Oral  SpO2: 98% 95% 95% 90%  Weight:      Height:         GEN- The patient  is chronically ill, but otherwise well appearing, alert and oriented x 3 today.   HEENT: normocephalic, atraumatic; sclera clear, conjunctiva pink; hearing intact; oropharynx clear; neck supple, no JVP Lymph- no cervical lymphadenopathy Lungs- CTA b/l, normal work of breathing.  No wheezes, rales, rhonchi Heart- RRR, no murmurs, rubs or gallops, PMI not laterally displaced GI- soft, non-tender, non-distended Extremities- no clubbing, cyanosis, or edema MS- no significant deformity or atrophy Skin- warm and dry, no rash or lesion Psych- intermittent agitation  Neuro- known dementia, no focal motor abnormalities noted  Labs:   Lab Results  Component Value Date   WBC 6.6 04/27/2022   HGB 9.7 (L) 04/27/2022   HCT 28.0 (L) 04/27/2022   MCV 94.0 04/27/2022   PLT 199 04/27/2022    Recent Labs  Lab 05/01/22 0032  NA 132*  K 4.6  CL 98  CO2 21*  BUN 14  CREATININE 0.75  CALCIUM 8.9  GLUCOSE 99     Discharge Medications:  Allergies as of 05/02/2022       Reactions   Amiodarone Other (See Comments)   Nausea, ataxia, weakness and chills.  Chest pain, dyspnea; near syncope   Dofetilide Other (See Comments)   Torsades and cardiac arrest during dofetilide initiation   Fentanyl Shortness Of Breath, Rash   Rosuvastatin Anaphylaxis, Anxiety, Hives, Itching, Nausea And Vomiting, Other (See Comments), Palpitations, Photosensitivity, Shortness Of Breath   Amoxicillin Diarrhea   Severe diarrhea, rash to vaginal area with swelling   Hctz [hydrochlorothiazide] Other (See Comments)   HypoNatremia   Hydralazine    Weak, sweats, red skin   Codeine Other (See Comments)   makes her hyper   Conjugated Estrogens Itching, Rash   Erythromycin Rash   had a rash with emycin, has done ok with other meds in it's class   Piroxicam Itching, Rash   Feldene        Medication List     STOP taking these medications    ALPRAZolam 0.5 MG tablet Commonly known as: XANAX   carisoprodol 350 MG  tablet Commonly known as: SOMA   Eszopiclone 3 MG Tabs   FLUoxetine 40 MG capsule Commonly known as: PROZAC   Myrbetriq 50 MG Tb24 tablet Generic drug: mirabegron ER   triamcinolone cream 0.1 % Commonly known as: KENALOG       TAKE these medications    albuterol 108 (90 Base) MCG/ACT inhaler Commonly known as: VENTOLIN HFA INHALE 2 PUFFS INTO THE LUNGS EVERY 6 HOURS AS NEEDED FOR WHEEZING OR SHORTNESS OF BREATH   cloNIDine 0.2 MG tablet Commonly known as: CATAPRES Take 0.1 mg by mouth 2 (two) times daily as needed (systolic over 093). if blood pressure is great than 818 systolic for more than an hour   feeding supplement (GLUCERNA SHAKE) Liqd Take 237 mLs by mouth daily as needed (if not eating meals).   Flutter Devi Use  as directed   levothyroxine 125 MCG tablet Commonly known as: SYNTHROID TAKE 1 TABLET(125 MCG) BY MOUTH DAILY   Lyllana 0.05 MG/24HR patch Generic drug: estradiol Place 1 patch onto the skin daily as needed (hormonal).   magnesium oxide 400 MG tablet Commonly known as: MAG-OX Take 1 tablet (400 mg total) by mouth daily.   melatonin 5 MG Tabs Take 1 tablet (5 mg total) by mouth at bedtime.   metoprolol tartrate 25 MG tablet Commonly known as: LOPRESSOR Take 25 mg tablet as needed if heart rate is above 100 after waiting for 10 minutes , if systolic blood pressure is less than 110 do not take metoprolol.   montelukast 10 MG tablet Commonly known as: SINGULAIR TAKE 1 TABLET(10 MG) BY MOUTH AT BEDTIME   nebivolol 10 MG tablet Commonly known as: BYSTOLIC TAKE 1 PGFQMK(10 MG) BY MOUTH DAILY AT 8 PM   QUEtiapine 25 MG tablet Commonly known as: SEROQUEL Take 1 tablet (25 mg total) by mouth at bedtime.   Rivaroxaban 15 MG Tabs tablet Commonly known as: XARELTO Take 1 tablet (15 mg total) by mouth daily with supper. STOP ELIQUIS   senna-docusate 8.6-50 MG tablet Commonly known as: Senokot-S Take 1 tablet by mouth 2 (two) times daily.    tamsulosin 0.4 MG Caps capsule Commonly known as: FLOMAX Take 1 capsule (0.4 mg total) by mouth daily after supper.        Disposition: [facility] SNF/[facility]hab Discharge Instructions     Diet - low sodium heart healthy   Complete by: As directed    Increase activity slowly   Complete by: As directed        Follow-up Information     urology Follow up.   Why: recommend urology referal in 1-2 weeks for voiding trial and management of urinary retenion        [name], MD Follow up.   Specialty: Internal Medicine Why: 1-2 weeks post discharge follow up Contact information: [place] [address] [phone]         Shirley Friar, PA-C Follow up.   Specialty: Physician Assistant Why: 06/13/22 at 10:20AM for Dr. Vonzell Schlatter information: [place] [id number] [phone]                 Duration of Discharge Encounter: Greater than 30 minutes including physician time.  Venetia Night, PA-C 05/02/2022 11:25 AM

## 2022-05-02 NOTE — Progress Notes (Signed)
Report given to [facility], at [employer]

## 2022-05-02 NOTE — TOC Transition Note (Signed)
Transition of Care [facility]) - CM/SW Discharge Note   Patient Details  Name: Marissa [name] MRN: [id number] Date of Birth: 08/15/1946  Transition of Care [facility]) CM/SW Contact:  Milas Gain, [place] Phone Number: 05/02/2022, 11:42 AM   Clinical Narrative:     Patient will DC to: [facility]   Anticipated DC date: 05/02/2022  Family notified: Marissa [name]   Transport by: Marissa [name]  ?  Per MD patient ready for DC to [facility] . RN, patient, patient's family, and facility notified of DC. Discharge Summary sent to facility. RN given number for report tele# [phone] RM#[phone]. DC packet on chart. Ambulance transport requested for patient.  CSW signing off.   Final next level of care: Skilled Nursing Facility Barriers to Discharge: No Barriers Identified   Patient Goals and CMS Choice   CMS Medicare.gov Compare Post Acute Care list provided to:: Patient Represenative (must comment) (Patients spouse Marissa [name]) Choice offered to / list presented to : Spouse (Patients spouse Marissa [name])  Discharge Placement              Patient chooses bed at:  [facility]) Patient to be transferred to facility by: [place] Name of family member notified: Marissa [name] Patient and family notified of of transfer: 05/02/22  Discharge Plan and Services                                     Social Determinants of Health (SDOH) Interventions     Readmission Risk Interventions     No data to display

## 2022-05-02 NOTE — Plan of Care (Signed)

## 2022-05-03 DIAGNOSIS — I4901 Ventricular fibrillation: Secondary | ICD-10-CM | POA: Diagnosis not present

## 2022-05-03 DIAGNOSIS — R2689 Other abnormalities of gait and mobility: Secondary | ICD-10-CM | POA: Diagnosis not present

## 2022-05-03 DIAGNOSIS — R531 Weakness: Secondary | ICD-10-CM | POA: Diagnosis not present

## 2022-05-03 DIAGNOSIS — I4819 Other persistent atrial fibrillation: Secondary | ICD-10-CM | POA: Diagnosis not present

## 2022-05-06 ENCOUNTER — Ambulatory Visit (INDEPENDENT_AMBULATORY_CARE_PROVIDER_SITE_OTHER): Payer: Medicare Other

## 2022-05-06 DIAGNOSIS — I6389 Other cerebral infarction: Secondary | ICD-10-CM

## 2022-05-07 ENCOUNTER — Encounter: Payer: Self-pay | Admitting: Cardiology

## 2022-05-07 LAB — CUP PACEART REMOTE DEVICE CHECK
Date Time Interrogation Session: 20230711105051
Implantable Pulse Generator Implant Date: 20221014

## 2022-05-14 ENCOUNTER — Ambulatory Visit: Payer: Medicare Other | Admitting: Internal Medicine

## 2022-05-14 ENCOUNTER — Encounter: Payer: Self-pay | Admitting: Internal Medicine

## 2022-05-14 VITALS — BP 120/60 | HR 59 | Resp 18 | Ht 64.0 in

## 2022-05-14 DIAGNOSIS — R197 Diarrhea, unspecified: Secondary | ICD-10-CM | POA: Diagnosis not present

## 2022-05-14 DIAGNOSIS — D649 Anemia, unspecified: Secondary | ICD-10-CM

## 2022-05-14 DIAGNOSIS — R338 Other retention of urine: Secondary | ICD-10-CM | POA: Diagnosis not present

## 2022-05-14 DIAGNOSIS — E43 Unspecified severe protein-calorie malnutrition: Secondary | ICD-10-CM

## 2022-05-14 DIAGNOSIS — I4819 Other persistent atrial fibrillation: Secondary | ICD-10-CM | POA: Diagnosis not present

## 2022-05-14 DIAGNOSIS — E876 Hypokalemia: Secondary | ICD-10-CM

## 2022-05-14 DIAGNOSIS — D509 Iron deficiency anemia, unspecified: Secondary | ICD-10-CM

## 2022-05-14 LAB — CBC
HCT: 33.9 % — ABNORMAL LOW (ref 36.0–46.0)
Hemoglobin: 11.6 g/dL — ABNORMAL LOW (ref 12.0–15.0)
MCHC: 34.3 g/dL (ref 30.0–36.0)
MCV: 89.5 fl (ref 78.0–100.0)
Platelets: 328 10*3/uL (ref 150.0–400.0)
RBC: 3.79 Mil/uL — ABNORMAL LOW (ref 3.87–5.11)
RDW: 13.9 % (ref 11.5–15.5)
WBC: 6.7 10*3/uL (ref 4.0–10.5)

## 2022-05-14 LAB — FERRITIN: Ferritin: 85.3 ng/mL (ref 10.0–291.0)

## 2022-05-14 NOTE — Patient Instructions (Signed)
Take the sleep medicine until gone. Stop magnesium. Stop tamsulosin.  If any problems with urinating start tamsulosin again.  We will check the labs and possibly the diarrhea.

## 2022-05-14 NOTE — Progress Notes (Signed)
   Subjective:   Patient ID: Marissa [name], female    DOB: [birth year], 76 y.o.   MRN: [id number]  HPI The patient is a 76 YO female coming in for hospital follow up (was admitted to start cardiac med, with cardiac arrest in patient with rehab stay after that). She has been having dark diarrhea since in hospital which has not been addressed. She is very weak and poor appetite. She denies fevers or chills. Some stomach discomfort but no pain. No SOB at rest but poor exercise tolerance. Not stable on feet.  PMH, [place], social history reviewed and updated  Review of Systems  Constitutional:  Positive for activity change, appetite change, fatigue and unexpected weight change.  HENT: Negative.    Eyes: Negative.   Respiratory:  Negative for cough, chest tightness and shortness of breath.   Cardiovascular:  Negative for chest pain, palpitations and leg swelling.  Gastrointestinal:  Positive for diarrhea and nausea. Negative for abdominal distention, abdominal pain, constipation and vomiting.  Musculoskeletal:  Positive for gait problem.  Skin: Negative.   Neurological:  Positive for weakness.  Psychiatric/Behavioral: Negative.      Objective:  Physical Exam Constitutional:      Appearance: She is well-developed. She is ill-appearing.  HENT:     Head: Normocephalic and atraumatic.  Cardiovascular:     Rate and Rhythm: Normal rate and regular rhythm.  Pulmonary:     Effort: Pulmonary effort is normal. No respiratory distress.     Breath sounds: Normal breath sounds. No wheezing or rales.  Abdominal:     General: Bowel sounds are normal. There is no distension.     Palpations: Abdomen is soft.     Tenderness: There is no abdominal tenderness. There is no rebound.  Musculoskeletal:     Cervical back: Normal range of motion.  Skin:    General: Skin is warm and dry.  Neurological:     Mental Status: She is alert and oriented to person, place, and time.     Coordination: Coordination  abnormal.     Comments: wheelchair     Vitals:   05/14/22 1541  BP: 120/60  Pulse: (!) 59  Resp: 18  SpO2: 98%  Height: 5' 4"  (1.626 m)    Assessment & Plan:

## 2022-05-15 ENCOUNTER — Encounter: Payer: Self-pay | Admitting: Internal Medicine

## 2022-05-15 ENCOUNTER — Other Ambulatory Visit: Payer: Self-pay | Admitting: Internal Medicine

## 2022-05-15 ENCOUNTER — Telehealth: Payer: Self-pay

## 2022-05-15 DIAGNOSIS — R197 Diarrhea, unspecified: Secondary | ICD-10-CM | POA: Diagnosis not present

## 2022-05-15 LAB — COMPREHENSIVE METABOLIC PANEL
ALT: 11 U/L (ref 0–35)
AST: 17 U/L (ref 0–37)
Albumin: 4 g/dL (ref 3.5–5.2)
Alkaline Phosphatase: 89 U/L (ref 39–117)
BUN: 8 mg/dL (ref 6–23)
CO2: 23 mEq/L (ref 19–32)
Calcium: 9 mg/dL (ref 8.4–10.5)
Chloride: 93 mEq/L — ABNORMAL LOW (ref 96–112)
Creatinine, Ser: 0.91 mg/dL (ref 0.40–1.20)
GFR: 60.89 mL/min (ref >60.00)
Glucose, Bld: 102 mg/dL — ABNORMAL HIGH (ref 70–99)
Potassium: 2.5 mEq/L — CL (ref 3.5–5.1)
Sodium: 131 mEq/L — ABNORMAL LOW (ref 135–145)
Total Bilirubin: 0.6 mg/dL (ref 0.2–1.2)
Total Protein: 6.6 g/dL (ref 6.0–8.3)

## 2022-05-15 LAB — MAGNESIUM: Magnesium: 1.1 mg/dL — ABNORMAL LOW (ref 1.5–2.5)

## 2022-05-15 LAB — VITAMIN B12: Vitamin B-12: 1411 pg/mL — ABNORMAL HIGH (ref 211–911)

## 2022-05-15 MED ORDER — POTASSIUM CHLORIDE CRYS ER 20 MEQ PO TBCR: 40.0000 meq | EXTENDED_RELEASE_TABLET | Freq: Two times a day (BID) | ORAL | 0 refills | Status: DC

## 2022-05-15 MED ORDER — MAGNESIUM OXIDE 400 MG PO TABS: 400.0000 mg | ORAL_TABLET | Freq: Two times a day (BID) | ORAL | 0 refills | Status: DC

## 2022-05-15 NOTE — Telephone Encounter (Signed)
CRITICAL VALUE STICKER  CRITICAL VALUE: Potassium 2.5  RECEIVER (on-site recipient of call): [place], [place]  DATE & TIME NOTIFIED: 05/15/2022 @ 8:14 am  MESSENGER (representative from lab): [email or web address].Ates  MD NOTIFIED: [name]   TIME OF NOTIFICATION: 8:15 am   RESPONSE:

## 2022-05-15 NOTE — Telephone Encounter (Signed)
See result note.  

## 2022-05-16 HISTORY — PX: TRANSTHORACIC ECHOCARDIOGRAM: SHX275

## 2022-05-16 MED ORDER — POTASSIUM CHLORIDE 20 MEQ/15ML (10%) PO SOLN: 40.0000 meq | Freq: Two times a day (BID) | ORAL | 2 refills | Status: DC

## 2022-05-17 ENCOUNTER — Encounter: Payer: Self-pay | Admitting: Internal Medicine

## 2022-05-17 ENCOUNTER — Other Ambulatory Visit: Payer: Self-pay | Admitting: Internal Medicine

## 2022-05-17 DIAGNOSIS — R197 Diarrhea, unspecified: Secondary | ICD-10-CM | POA: Insufficient documentation

## 2022-05-17 LAB — GI PROFILE, STOOL, PCR

## 2022-05-17 MED ORDER — VANCOMYCIN HCL 125 MG PO CAPS: 125.0000 mg | ORAL_CAPSULE | Freq: Four times a day (QID) | ORAL | 0 refills | Status: DC

## 2022-05-17 NOTE — Assessment & Plan Note (Signed)
Checking CMP and adjust as needed. Her appetite has been poor since hospital and likely dietary intake is insufficient.

## 2022-05-17 NOTE — Assessment & Plan Note (Signed)
Needs CBC and CMP and magnesium levels today given recent cardiac arrest. Will need to ensure all electrolytes are in range. She is taking xarelto and have dark diarrhea since hospital and may be having blood in stool so CBC needed.

## 2022-05-17 NOTE — Assessment & Plan Note (Signed)
With persistent dark diarrhea with eating since hospital which has not been addressed. Checking CBC and CMP and magnesium as well as GI profile to rule out pathogenic causes of the diarrhea. Given the dark color bleeding cannot be ruled out checking CBC.

## 2022-05-17 NOTE — Assessment & Plan Note (Addendum)
>  10%  loss of weight since hospital admission and her appetite is poor. Advised to increase nutrition slowly and use supplements in the meantime to help prevent further muscle wasting.

## 2022-05-17 NOTE — Assessment & Plan Note (Signed)
She is urinating normally without catheter since rehab facility. She does not have signs of infection currently. Needs CMP due to poor appetite.

## 2022-05-17 NOTE — Assessment & Plan Note (Signed)
Checking magnesium today, adjust as needed.

## 2022-05-17 NOTE — Assessment & Plan Note (Signed)
Hg level with drop in the hospital without recheck at either rehab facility or hospital. With ongoing dark diarrhea this is concerning. Checking CBC for worsening. She is on xarelto which can predispose to GI bleeding.

## 2022-05-19 ENCOUNTER — Encounter: Payer: Self-pay | Admitting: Internal Medicine

## 2022-05-21 ENCOUNTER — Telehealth (HOSPITAL_BASED_OUTPATIENT_CLINIC_OR_DEPARTMENT_OTHER): Payer: Self-pay | Admitting: Emergency Medicine

## 2022-05-21 ENCOUNTER — Observation Stay (HOSPITAL_BASED_OUTPATIENT_CLINIC_OR_DEPARTMENT_OTHER)
Admission: EM | Admit: 2022-05-21 | Discharge: 2022-05-23 | Disposition: A | Discharge status: Home / Self Care | Payer: Medicare Other | Attending: Internal Medicine | Admitting: Internal Medicine

## 2022-05-21 ENCOUNTER — Encounter (HOSPITAL_BASED_OUTPATIENT_CLINIC_OR_DEPARTMENT_OTHER): Payer: Self-pay | Admitting: Pediatrics

## 2022-05-21 ENCOUNTER — Other Ambulatory Visit: Payer: Self-pay

## 2022-05-21 DIAGNOSIS — I10 Essential (primary) hypertension: Secondary | ICD-10-CM

## 2022-05-21 DIAGNOSIS — Z881 Allergy status to other antibiotic agents status: Secondary | ICD-10-CM | POA: Diagnosis not present

## 2022-05-21 DIAGNOSIS — Z8249 Family history of ischemic heart disease and other diseases of the circulatory system: Secondary | ICD-10-CM

## 2022-05-21 DIAGNOSIS — Z885 Allergy status to narcotic agent status: Secondary | ICD-10-CM

## 2022-05-21 DIAGNOSIS — Z79899 Other long term (current) drug therapy: Secondary | ICD-10-CM

## 2022-05-21 DIAGNOSIS — E785 Hyperlipidemia, unspecified: Secondary | ICD-10-CM | POA: Diagnosis not present

## 2022-05-21 DIAGNOSIS — Z888 Allergy status to other drugs, medicaments and biological substances status: Secondary | ICD-10-CM | POA: Diagnosis not present

## 2022-05-21 DIAGNOSIS — R5383 Other fatigue: Secondary | ICD-10-CM | POA: Diagnosis not present

## 2022-05-21 DIAGNOSIS — I4819 Other persistent atrial fibrillation: Secondary | ICD-10-CM | POA: Diagnosis present

## 2022-05-21 DIAGNOSIS — E872 Acidosis, unspecified: Secondary | ICD-10-CM | POA: Diagnosis not present

## 2022-05-21 DIAGNOSIS — E876 Hypokalemia: Principal | ICD-10-CM | POA: Diagnosis present

## 2022-05-21 DIAGNOSIS — Z8674 Personal history of sudden cardiac arrest: Secondary | ICD-10-CM | POA: Diagnosis not present

## 2022-05-21 DIAGNOSIS — Z79890 Hormone replacement therapy (postmenopausal): Secondary | ICD-10-CM

## 2022-05-21 DIAGNOSIS — J45909 Unspecified asthma, uncomplicated: Secondary | ICD-10-CM | POA: Diagnosis present

## 2022-05-21 DIAGNOSIS — I712 Thoracic aortic aneurysm, without rupture, unspecified: Secondary | ICD-10-CM | POA: Diagnosis present

## 2022-05-21 DIAGNOSIS — E860 Dehydration: Secondary | ICD-10-CM | POA: Diagnosis present

## 2022-05-21 DIAGNOSIS — M1990 Unspecified osteoarthritis, unspecified site: Secondary | ICD-10-CM | POA: Diagnosis present

## 2022-05-21 DIAGNOSIS — Z803 Family history of malignant neoplasm of breast: Secondary | ICD-10-CM

## 2022-05-21 DIAGNOSIS — E871 Hypo-osmolality and hyponatremia: Secondary | ICD-10-CM | POA: Diagnosis not present

## 2022-05-21 DIAGNOSIS — Z66 Do not resuscitate: Secondary | ICD-10-CM | POA: Diagnosis not present

## 2022-05-21 DIAGNOSIS — Z7901 Long term (current) use of anticoagulants: Secondary | ICD-10-CM

## 2022-05-21 DIAGNOSIS — Z880 Allergy status to penicillin: Secondary | ICD-10-CM | POA: Diagnosis not present

## 2022-05-21 DIAGNOSIS — Z8261 Family history of arthritis: Secondary | ICD-10-CM

## 2022-05-21 DIAGNOSIS — Z8673 Personal history of transient ischemic attack (TIA), and cerebral infarction without residual deficits: Secondary | ICD-10-CM

## 2022-05-21 DIAGNOSIS — Z806 Family history of leukemia: Secondary | ICD-10-CM

## 2022-05-21 DIAGNOSIS — D649 Anemia, unspecified: Secondary | ICD-10-CM | POA: Diagnosis present

## 2022-05-21 DIAGNOSIS — R197 Diarrhea, unspecified: Secondary | ICD-10-CM | POA: Insufficient documentation

## 2022-05-21 DIAGNOSIS — A0472 Enterocolitis due to Clostridium difficile, not specified as recurrent: Principal | ICD-10-CM

## 2022-05-21 DIAGNOSIS — F0390 Unspecified dementia without behavioral disturbance: Secondary | ICD-10-CM | POA: Diagnosis present

## 2022-05-21 DIAGNOSIS — E039 Hypothyroidism, unspecified: Secondary | ICD-10-CM | POA: Diagnosis present

## 2022-05-21 DIAGNOSIS — Z833 Family history of diabetes mellitus: Secondary | ICD-10-CM

## 2022-05-21 LAB — RENAL FUNCTION PANEL
Albumin: 2.9 g/dL — ABNORMAL LOW (ref 3.5–5.0)
Anion gap: 7 (ref 5–15)
BUN: 7 mg/dL — ABNORMAL LOW (ref 8–23)
CO2: 20 mmol/L — ABNORMAL LOW (ref 22–32)
Calcium: 8.5 mg/dL — ABNORMAL LOW (ref 8.9–10.3)
Chloride: 110 mmol/L (ref 98–111)
Creatinine, Ser: 0.8 mg/dL (ref 0.44–1.00)
GFR, Estimated: >60 mL/min (ref >60)
Glucose, Bld: 95 mg/dL (ref 70–99)
Phosphorus: 2.5 mg/dL (ref 2.5–4.6)
Potassium: 3.7 mmol/L (ref 3.5–5.1)
Sodium: 137 mmol/L (ref 135–145)

## 2022-05-21 LAB — CBC WITH DIFFERENTIAL/PLATELET
Abs Immature Granulocytes: 0.01 10*3/uL (ref 0.00–0.07)
Basophils Absolute: 0.1 10*3/uL (ref 0.0–0.1)
Basophils Relative: 1 %
Eosinophils Absolute: 0.2 10*3/uL (ref 0.0–0.5)
Eosinophils Relative: 4 %
HCT: 34.1 % — ABNORMAL LOW (ref 36.0–46.0)
Hemoglobin: 12 g/dL (ref 12.0–15.0)
Immature Granulocytes: 0 %
Lymphocytes Relative: 25 %
Lymphs Abs: 1.2 10*3/uL (ref 0.7–4.0)
MCH: 30.8 pg (ref 26.0–34.0)
MCHC: 35.2 g/dL (ref 30.0–36.0)
MCV: 87.4 fL (ref 80.0–100.0)
Monocytes Absolute: 0.6 10*3/uL (ref 0.1–1.0)
Monocytes Relative: 12 %
Neutro Abs: 2.9 10*3/uL (ref 1.7–7.7)
Neutrophils Relative %: 58 %
Platelets: 244 10*3/uL (ref 150–400)
RBC: 3.9 MIL/uL (ref 3.87–5.11)
RDW: 13.2 % (ref 11.5–15.5)
WBC: 5 10*3/uL (ref 4.0–10.5)
nRBC: 0 % (ref 0.0–0.2)

## 2022-05-21 LAB — COMPREHENSIVE METABOLIC PANEL
ALT: 10 U/L (ref 0–44)
AST: 19 U/L (ref 15–41)
Albumin: 3.7 g/dL (ref 3.5–5.0)
Alkaline Phosphatase: 71 U/L (ref 38–126)
Anion gap: 12 (ref 5–15)
BUN: 9 mg/dL (ref 8–23)
CO2: 20 mmol/L — ABNORMAL LOW (ref 22–32)
Calcium: 9.1 mg/dL (ref 8.9–10.3)
Chloride: 101 mmol/L (ref 98–111)
Creatinine, Ser: 0.99 mg/dL (ref 0.44–1.00)
GFR, Estimated: >60 mL/min (ref >60)
Glucose, Bld: 105 mg/dL — ABNORMAL HIGH (ref 70–99)
Potassium: 2.5 mmol/L — CL (ref 3.5–5.1)
Sodium: 133 mmol/L — ABNORMAL LOW (ref 135–145)
Total Bilirubin: 0.4 mg/dL (ref 0.3–1.2)
Total Protein: 6.9 g/dL (ref 6.5–8.1)

## 2022-05-21 LAB — BASIC METABOLIC PANEL
Anion gap: 15 (ref 5–15)
BUN: 7 mg/dL — ABNORMAL LOW (ref 8–23)
CO2: 16 mmol/L — ABNORMAL LOW (ref 22–32)
Calcium: 8.6 mg/dL — ABNORMAL LOW (ref 8.9–10.3)
Chloride: 102 mmol/L (ref 98–111)
Creatinine, Ser: 0.85 mg/dL (ref 0.44–1.00)
GFR, Estimated: >60 mL/min (ref >60)
Glucose, Bld: 88 mg/dL (ref 70–99)
Potassium: 3.3 mmol/L — ABNORMAL LOW (ref 3.5–5.1)
Sodium: 133 mmol/L — ABNORMAL LOW (ref 135–145)

## 2022-05-21 LAB — LACTIC ACID, PLASMA: Lactic Acid, Venous: 1.2 mmol/L (ref 0.5–1.9)

## 2022-05-21 LAB — MAGNESIUM
Magnesium: 1.2 mg/dL — ABNORMAL LOW (ref 1.7–2.4)
Magnesium: 2 mg/dL (ref 1.7–2.4)

## 2022-05-21 MED ORDER — CLONIDINE HCL 0.1 MG PO TABS: 0.2000 mg | ORAL_TABLET | Freq: Two times a day (BID) | ORAL | Status: DC | PRN

## 2022-05-21 MED ORDER — ORAL CARE MOUTH RINSE
15.0000 mL | OROMUCOSAL | Status: DC | PRN
Start: 2022-05-21 — End: 2022-05-23

## 2022-05-21 MED ORDER — CHLORHEXIDINE GLUCONATE CLOTH 2 % EX PADS
6.0000 | MEDICATED_PAD | Freq: Every day | CUTANEOUS | Status: DC
  Administered 2022-05-21 – 2022-05-22 (×2): 6 via TOPICAL

## 2022-05-21 MED ORDER — VANCOMYCIN HCL 125 MG PO CAPS
125.0000 mg | ORAL_CAPSULE | Freq: Four times a day (QID) | ORAL | Status: DC
  Administered 2022-05-21 – 2022-05-23 (×8): 125 mg via ORAL
  Filled 2022-05-21 (×9): qty 1

## 2022-05-21 MED ORDER — RIVAROXABAN 15 MG PO TABS
15.0000 mg | ORAL_TABLET | Freq: Every day | ORAL | Status: DC
  Administered 2022-05-21 – 2022-05-22 (×2): 15 mg via ORAL
  Filled 2022-05-21 (×2): qty 1

## 2022-05-21 MED ORDER — LOSARTAN POTASSIUM 25 MG PO TABS
25.0000 mg | ORAL_TABLET | Freq: Every day | ORAL | Status: DC
  Administered 2022-05-21 – 2022-05-23 (×3): 25 mg via ORAL
  Filled 2022-05-21 (×3): qty 1

## 2022-05-21 MED ORDER — POTASSIUM CHLORIDE 10 MEQ/100ML IV SOLN
10.0000 meq | INTRAVENOUS | Status: AC
  Administered 2022-05-21 – 2022-05-22 (×3): 10 meq via INTRAVENOUS
  Filled 2022-05-21 (×3): qty 100

## 2022-05-21 MED ORDER — MUPIROCIN 2 % EX OINT
1.0000 | TOPICAL_OINTMENT | Freq: Two times a day (BID) | CUTANEOUS | Status: DC
  Administered 2022-05-21 – 2022-05-23 (×4): 1 via NASAL
  Filled 2022-05-21: qty 22

## 2022-05-21 MED ORDER — ACETAMINOPHEN 325 MG PO TABS
650.0000 mg | ORAL_TABLET | Freq: Four times a day (QID) | ORAL | Status: DC | PRN
  Administered 2022-05-21 – 2022-05-23 (×3): 650 mg via ORAL
  Filled 2022-05-21 (×4): qty 2

## 2022-05-21 MED ORDER — ALBUTEROL SULFATE HFA 108 (90 BASE) MCG/ACT IN AERS
2.0000 | INHALATION_SPRAY | Freq: Four times a day (QID) | RESPIRATORY_TRACT | Status: DC | PRN
Start: 2022-05-21 — End: 2022-05-21

## 2022-05-21 MED ORDER — LACTATED RINGERS IV BOLUS
1000.0000 mL | Freq: Once | INTRAVENOUS | Status: AC
  Administered 2022-05-21: 1000 mL via INTRAVENOUS

## 2022-05-21 MED ORDER — LEVOTHYROXINE SODIUM 25 MCG PO TABS
125.0000 ug | ORAL_TABLET | Freq: Every day | ORAL | Status: DC
Start: 2022-05-22 — End: 2022-05-23
  Administered 2022-05-22 – 2022-05-23 (×2): 125 ug via ORAL
  Filled 2022-05-21 (×2): qty 1

## 2022-05-21 MED ORDER — LORAZEPAM 2 MG/ML IJ SOLN
0.5000 mg | Freq: Once | INTRAMUSCULAR | Status: AC
Start: 2022-05-21 — End: 2022-05-21
  Administered 2022-05-21: 0.5 mg via INTRAVENOUS
  Filled 2022-05-21: qty 1

## 2022-05-21 MED ORDER — MELATONIN 5 MG PO TABS
5.0000 mg | ORAL_TABLET | Freq: Every day | ORAL | Status: DC
Start: 2022-05-21 — End: 2022-05-23
  Administered 2022-05-21 – 2022-05-22 (×2): 5 mg via ORAL
  Filled 2022-05-21 (×2): qty 1

## 2022-05-21 MED ORDER — MONTELUKAST SODIUM 10 MG PO TABS
10.0000 mg | ORAL_TABLET | Freq: Every day | ORAL | Status: DC
  Administered 2022-05-21 – 2022-05-22 (×2): 10 mg via ORAL
  Filled 2022-05-21 (×2): qty 1

## 2022-05-21 MED ORDER — GLUCERNA SHAKE PO LIQD
237.0000 mL | Freq: Every day | ORAL | Status: DC | PRN
Start: 2022-05-21 — End: 2022-05-23

## 2022-05-21 MED ORDER — POTASSIUM CHLORIDE 10 MEQ/100ML IV SOLN
10.0000 meq | INTRAVENOUS | Status: AC
  Administered 2022-05-21 (×6): 10 meq via INTRAVENOUS
  Filled 2022-05-21 (×2): qty 100

## 2022-05-21 MED ORDER — PROCHLORPERAZINE EDISYLATE 10 MG/2ML IJ SOLN: 10.0000 mg | Freq: Four times a day (QID) | INTRAMUSCULAR | Status: DC | PRN

## 2022-05-21 MED ORDER — CLONIDINE HCL 0.1 MG PO TABS
0.1000 mg | ORAL_TABLET | Freq: Two times a day (BID) | ORAL | Status: DC | PRN
Start: 2022-05-21 — End: 2022-05-22
  Filled 2022-05-21: qty 1

## 2022-05-21 MED ORDER — QUETIAPINE FUMARATE 25 MG PO TABS
25.0000 mg | ORAL_TABLET | Freq: Every day | ORAL | Status: DC
  Administered 2022-05-21 – 2022-05-22 (×2): 25 mg via ORAL
  Filled 2022-05-21 (×2): qty 1

## 2022-05-21 MED ORDER — MAGNESIUM SULFATE 2 GM/50ML IV SOLN
2.0000 g | Freq: Once | INTRAVENOUS | Status: AC
  Administered 2022-05-21: 2 g via INTRAVENOUS
  Filled 2022-05-21: qty 50

## 2022-05-21 MED ORDER — ACETAMINOPHEN 650 MG RE SUPP: 650.0000 mg | Freq: Four times a day (QID) | RECTAL | Status: DC | PRN

## 2022-05-21 MED ORDER — ALBUTEROL SULFATE (2.5 MG/3ML) 0.083% IN NEBU: 2.5000 mg | INHALATION_SOLUTION | Freq: Four times a day (QID) | RESPIRATORY_TRACT | Status: DC | PRN

## 2022-05-21 MED ORDER — LACTATED RINGERS IV SOLN: INTRAVENOUS | Status: DC

## 2022-05-21 MED ORDER — CHLORHEXIDINE GLUCONATE CLOTH 2 % EX PADS: 6.0000 | MEDICATED_PAD | Freq: Every day | CUTANEOUS | Status: DC

## 2022-05-21 NOTE — ED Notes (Signed)
Phone Handoff Report provided to [facility] [place] EMT-P)

## 2022-05-21 NOTE — ED Notes (Signed)
Phone Handoff Report provided to rec RN at [facility] ICU/ Stepdown unit

## 2022-05-21 NOTE — ED Notes (Signed)
IV established, placed on cont cardiac monitoring due to potential Hypokalemia, NSR noted, resp even and non-labored, GCS 15

## 2022-05-21 NOTE — ED Notes (Signed)
K infusion #4 initiated

## 2022-05-21 NOTE — ED Notes (Signed)
Mg gtt #2 initiated IV

## 2022-05-21 NOTE — ED Notes (Signed)
Repeat Mg and K level redrawn as per orders

## 2022-05-21 NOTE — Progress Notes (Signed)
Plan of Care Note for accepted transfer   Patient: Marissa [name] MRN: [id number]   DOA: 05/21/2022  Facility requesting transfer: [facility] Requesting Provider: Dr. Jamille Yoshino Nine Reason for transfer: Hypokalemia/Hypomagnesemia Facility course: 76 yo F w/ history of persistent a fib, HTN, HLD, CVA. Presenting with MCHP w/ electrolyte abnormalities and diarrhea. Diarrhea for greater than a week. Poor PO intake. She was seen in PCP clinic and found to be hypokalemic and hypomagnesemic. She was started on oral K+ and Mg2+ but didn't tolerate it. PCP recommended that she come to ED. Found to have K+ 2.5 and Mg2+ < 0.5 in ED. Started on K+ and Mg2+ IV, fluids. Requested that EDP rpt labs in 4 hours as we do not know how long she will be holding at [facility]. No EKG changes noted.   Plan of care: The patient is accepted for admission to Progressive unit, at [facility]. While holding at [facility], medical decision making responsibilities remain with the [place]. Upon arrival to [facility], [facility] will assume care. Thank you.   Author: Jonnie Finner, DO 05/21/2022  Check [email or web address] for on-call coverage.  Nursing staff, Please call [place] number on Amion as soon as patient's arrival, so appropriate admitting provider can evaluate the pt.

## 2022-05-21 NOTE — ED Triage Notes (Signed)
C/O low magnesium and low potassium; reports recent hospitalization and has had diarrhea for 3 weeks; + C diff. Reports unable to tolerate oral potassium replacement;

## 2022-05-21 NOTE — ED Provider Notes (Signed)
[facility] EMERGENCY DEPARTMENT Provider Note   CSN: [id number] Arrival date & time: 05/21/22  4103     History  Chief Complaint  Patient presents with   Abnormal Lab    Marissa [name] is a 76 y.o. female.  76 yo F with a chief complaint of diarrhea and fatigue.  The patient had been recently hospitalized for atrial fibrillation she was started on Tikosyn and unfortunately suffered a cardiac arrest.  She had had diarrhea in the hospital and had seen her family doctor upon being discharged.  Was found to have a potassium of 2.5 and was started on oral supplementation.  Unfortunately the patient is having trouble tolerating oral.  She tried the caterer and was unable to swallow them due to their size and then had an intolerance to the oral liquid.  She also had trouble tolerating the magnesium supplements.  Was then encouraged to come to the emergency department for IV administration.  She does feel like the diarrhea has improved.  Happening less frequently.  Now is occurring about 3 times a day.   Abnormal Lab      Home Medications Prior to Admission medications   Medication Sig Start Date End Date Taking? Authorizing Provider  albuterol (VENTOLIN HFA) 108 (90 Base) MCG/ACT inhaler INHALE 2 PUFFS INTO THE LUNGS EVERY 6 HOURS AS NEEDED FOR WHEEZING OR SHORTNESS OF BREATH 01/21/22   [name], MD  cloNIDine (CATAPRES) 0.2 MG tablet Take 0.1 mg by mouth 2 (two) times daily as needed (systolic over 013). if blood pressure is great than 143 systolic for more than an hour 08/20/21   [name], MD  feeding supplement, GLUCERNA SHAKE, (GLUCERNA SHAKE) LIQD Take 237 mLs by mouth daily as needed (if not eating meals).    [provider]  levothyroxine (SYNTHROID) 125 MCG tablet TAKE 1 TABLET(125 MCG) BY MOUTH DAILY 01/21/22   [name], MD  LYLLANA 0.05 MG/24HR patch Place 1 patch onto the skin daily as needed (hormonal). 09/03/21   [provider]  magnesium oxide (MAG-OX) 400 MG tablet Take 1 tablet (400 mg total) by mouth 2 (two) times daily. 05/15/22   [name], MD  melatonin 5 MG TABS Take 1 tablet (5 mg total) by mouth at bedtime. 05/02/22   Baldwin Jamaica, PA-C  metoprolol tartrate (LOPRESSOR) 25 MG tablet Take 25 mg tablet as needed if heart rate is above 100 after waiting for 10 minutes , if systolic blood pressure is less than 110 do not take metoprolol. 03/13/22   [name], MD  montelukast (SINGULAIR) 10 MG tablet TAKE 1 TABLET(10 MG) BY MOUTH AT BEDTIME 12/25/21   [name], MD  nebivolol (BYSTOLIC) 10 MG tablet TAKE 1 TABLET(10 MG) BY MOUTH DAILY AT 8 PM 09/26/21   [name], MD  potassium chloride 20 MEQ/15ML (10%) SOLN Take 30 mLs (40 mEq total) by mouth 2 (two) times daily. 05/16/22   [name], MD  QUEtiapine (SEROQUEL) 25 MG tablet Take 1 tablet (25 mg total) by mouth at bedtime. 05/02/22   Baldwin Jamaica, PA-C  Respiratory Therapy Supplies (FLUTTER) DEVI Use as directed 11/18/16   Tanda Rockers, MD  Rivaroxaban (XARELTO) 15 MG TABS tablet Take 1 tablet (15 mg total) by mouth daily with supper. STOP ELIQUIS 01/07/22   [name], MD  senna-docusate (SENOKOT-S) 8.6-50 MG tablet Take 1 tablet by mouth 2 (two) times daily. 05/02/22  Baldwin Jamaica, PA-C  vancomycin (VANCOCIN) 125 MG capsule Take 1 capsule (125 mg total) by mouth 4 (four) times daily for 10 days. 05/17/22 05/27/22  [name], MD      Allergies    Amiodarone, Dofetilide, Fentanyl, Rosuvastatin, Amoxicillin, Hctz [hydrochlorothiazide], Hydralazine, Codeine, Conjugated estrogens, Erythromycin, and Piroxicam    Review of Systems   Review of Systems  Physical Exam Updated Vital Signs BP (!) 159/73 (BP Location: Left Arm)   Pulse 60   Temp 98.2 F (36.8 C)   Resp 16   Ht 5' 4"  (1.626 m)   Wt 54.5 kg   SpO2 99%   BMI 20.62 kg/m  Physical Exam Vitals and nursing note  reviewed.  Constitutional:      General: She is not in acute distress.    Appearance: She is well-developed. She is not diaphoretic.  HENT:     Head: Normocephalic and atraumatic.  Eyes:     Pupils: Pupils are equal, round, and reactive to light.  Cardiovascular:     Rate and Rhythm: Normal rate and regular rhythm.     Heart sounds: No murmur heard.    No friction rub. No gallop.  Pulmonary:     Effort: Pulmonary effort is normal.     Breath sounds: No wheezing or rales.  Abdominal:     General: There is no distension.     Palpations: Abdomen is soft.     Tenderness: There is no abdominal tenderness.  Musculoskeletal:        General: No tenderness.     Cervical back: Normal range of motion and neck supple.  Skin:    General: Skin is warm and dry.  Neurological:     Mental Status: She is alert and oriented to person, place, and time.  Psychiatric:        Behavior: Behavior normal.     ED Results / Procedures / Treatments   Labs (all labs ordered are listed, but only abnormal results are displayed) Labs Reviewed  CBC WITH DIFFERENTIAL/PLATELET - Abnormal; Notable for the following components:      Result Value   HCT 34.1 (*)    All other components within normal limits  COMPREHENSIVE METABOLIC PANEL - Abnormal; Notable for the following components:   Sodium 133 (*)    Potassium 2.5 (*)    CO2 20 (*)    Glucose, Bld 105 (*)    BUN <5 (*)    Total Protein <3.0 (*)    Albumin <1.5 (*)    Alkaline Phosphatase 34 (*)    All other components within normal limits  MAGNESIUM - Abnormal; Notable for the following components:   Magnesium <0.5 (*)    All other components within normal limits  C DIFFICILE QUICK SCREEN W PCR REFLEX    BASIC METABOLIC PANEL  MAGNESIUM    EKG EKG Interpretation  Date/Time:  Tuesday May 21 2022 09:55:51 EDT Ventricular Rate:  62 PR Interval:  173 QRS Duration: 96 QT Interval:  453 QTC Calculation: 460 R Axis:   -35 Text  Interpretation: Sinus rhythm Multiple ventricular premature complexes Left axis deviation Abnormal R-wave progression, early transition Repol abnrm suggests ischemia, anterolateral No significant change since last tracing Confirmed by Deno Etienne [phone]) on 05/21/2022 10:11:52 AM  Radiology No results found.  Procedures Procedures    Medications Ordered in ED Medications  potassium chloride 10 mEq in 100 mL IVPB (10 mEq Intravenous New Bag/Given 05/21/22 1206)  lactated ringers infusion (has  no administration in time range)  magnesium sulfate IVPB 2 g 50 mL (0 g Intravenous Stopped 05/21/22 1127)  lactated ringers bolus 1,000 mL (1,000 mLs Intravenous New Bag/Given 05/21/22 1215)  magnesium sulfate IVPB 2 g 50 mL (0 g Intravenous Stopped 05/21/22 1209)  LORazepam (ATIVAN) injection 0.5 mg (0.5 mg Intravenous Given 05/21/22 1128)    ED Course/ Medical Decision Making/ A&P                           Medical Decision Making Amount and/or Complexity of Data Reviewed Labs: ordered.  Risk Prescription drug management. Decision regarding hospitalization.   76 yo F with a chief complaints of fatigue and diarrhea.  On record review the patient was recently hospitalized for atrial fibrillation and unfortunately suffered a cardiac arrest after starting Tikosyn.  She has had fatigue and diarrhea.  Had C. difficile toxin positive stool sample done at her doctor's office.  Also was noted to have a magnesium of 1.1 and a potassium of 2.5.  EKG here without obvious QT prolongation.  Will give a bolus of IV fluids that she appears dehydrated.  Recheck potassium and magnesium.  Send off more formal C. difficile testing.  The patient's potassium continues to be 2.5.  Her mag level now is un measurable.  Her albumin of note is also very low.  We will give IV K and mag.  I think based on the patient's inability to tolerate oral well at home and having some progressive worsening of her labs she would benefit  from hospitalization.  I will discuss with medicine for admission.  Dr. Marylyn Ishihara recommends every 3 hours BMP.  Adding 2 more grams of mag.  LR infusion.  CRITICAL CARE Performed by: Cecilio Asper   Total critical care time: 80 minutes  Critical care time was exclusive of separately billable procedures and treating other patients.  Critical care was necessary to treat or prevent imminent or life-threatening deterioration.  Critical care was time spent personally by me on the following activities: development of treatment plan with patient and/or surrogate as well as nursing, discussions with consultants, evaluation of patient's response to treatment, examination of patient, obtaining history from patient or surrogate, ordering and performing treatments and interventions, ordering and review of laboratory studies, ordering and review of radiographic studies, pulse oximetry and re-evaluation of patient's condition.  The patients results and plan were reviewed and discussed.   Any x-rays performed were independently reviewed by myself.   Differential diagnosis were considered with the presenting HPI.  Medications  potassium chloride 10 mEq in 100 mL IVPB (10 mEq Intravenous New Bag/Given 05/21/22 1206)  lactated ringers infusion (has no administration in time range)  magnesium sulfate IVPB 2 g 50 mL (0 g Intravenous Stopped 05/21/22 1127)  lactated ringers bolus 1,000 mL (1,000 mLs Intravenous New Bag/Given 05/21/22 1215)  magnesium sulfate IVPB 2 g 50 mL (0 g Intravenous Stopped 05/21/22 1209)  LORazepam (ATIVAN) injection 0.5 mg (0.5 mg Intravenous Given 05/21/22 1128)    Vitals:   05/21/22 0953 05/21/22 0955 05/21/22 1056  BP:  (!) 163/74 (!) 159/73  Pulse:  60 60  Resp:  17 16  Temp:  98.2 F (36.8 C)   SpO2:  100% 99%  Weight: 54.5 kg    Height: 5' 4"  (1.626 m)      Final diagnoses:  Hypokalemia  Hypomagnesemia    Admission/ observation were discussed with the admitting  physician, patient  and/or family and they are comfortable with the plan.          Final Clinical Impression(s) / ED Diagnoses Final diagnoses:  Hypokalemia  Hypomagnesemia    Rx / DC Orders ED Discharge Orders     None         Deno Etienne, DO 05/21/22 1221

## 2022-05-21 NOTE — ED Notes (Signed)
#  2 KCL IV initiated

## 2022-05-21 NOTE — Progress Notes (Signed)
Attempted to call pt's husband on phone to complete nursing admission history.Pt's husband does not answer phone. Available information obtained from chart and computer notes. Doroteo Bradford BSN, RN-BC Throughput Nurse 05/21/2022 7:36 PM

## 2022-05-21 NOTE — H&P (Signed)
History and Physical    Patient: Marissa [name] KCL:[id number] DOB: 22-Oct-1946 DOA: 05/21/2022 DOS: the patient was seen and examined on 05/21/2022 PCP: [name], MD  Patient coming from: Home  Chief Complaint:  Chief Complaint  Patient presents with   Abnormal Lab   HPI: Marissa [name] is a 76 y.o. female with medical history significant of HTN, HLD, dementia, a fib. Presenting with diarrhea and abnormal labs. History from husband. He reports that the patient was in rehab after a hospital stay for afib. She developed diarrhea and had poor PO intake. When her diarrhea did not improve, he took her to her PCP. She was diagnosed with c diff and started on vanc. He later received noticed that her K+ and Mg2+ were severely low. They were initially given oral replacement, but she could not tolerate it. So, it was recommended she come to the ED. She did not have any fevers, N/V; although her PO intake has been poor. They deny any other aggravating or alleviating factors.   Review of Systems: As mentioned in the history of present illness. All other systems reviewed and are negative. Past Medical History:  Diagnosis Date   Arthritis    Asthma    Bicuspid aortic valve 05/2017   Likely functional bicuspid aortic valve with sclerosis and no stenosis.   Bursitis    Cataract    mild   Dysthymia    Hemorrhoids    Hx of ulcerative colitis    per dr Arnoldo Morale as per pt.   Hyperlipidemia    Labile hypertension    managed - labile.   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 Roxan Hockey)   Thyroid disease    Past Surgical History:  Procedure Laterality Date   ABDOMINAL HYSTERECTOMY     BREAST EXCISIONAL BIOPSY Left     BUNIONECTOMY     Cardiac Event Monitor  07/2017   Overall relatively normal.  Normal sinus rhythm with rare bradycardia and tachycardia.  Heart rate ranged from 55-110 bpm.  Occasional PACs and PVCs, every single 1 was felt.  No arrhythmias other than one short 4 beat run of PACs.   COLONOSCOPY  02/04/2005   all normal    FOOT SURGERY     3 pins in toes    HAND SURGERY     left thumb joint resection   LOOP RECORDER INSERTION N/A 08/09/2021   Procedure: LOOP RECORDER INSERTION;  Surgeon: [name], MD;  Location: [facility];  Service: Cardiovascular;  Laterality: N/A;   nasal revision     NM MYOVIEW LTD  07/2017   LOW RISK study.  No ischemia or infarction.  EF greater than 65%.    SINUS IRRIGATION     TONSILLECTOMY AND ADENOIDECTOMY     TRANSTHORACIC ECHOCARDIOGRAM  06/'1, 8/'19   a) Moderate LVH. Normal EF 60-65%. Normal diastolic parameters. --> Difficult to fully visualize the aortic valve. Cannot exclude bicuspid valve. Mild aortic stenosis noted. No PFO. Mildly dilated left atrium. Trivial MR. No comment on mitral valve prolapse. Moderately dilated ascending aorta.;; b) F/u Echo To evaluate the aortic valve. -- Bicuspid AoV -  mildly thickened / calcified. - No stenosis   TRANSTHORACIC ECHOCARDIOGRAM  08/08/2021   EF 60 to 65%.  Normal diastolic parameters?.   ?  Functional bicuspid aortic valve with R&L cusp sommisure.  Very sclerotic.  Stable gradients ~mean gradient 10 mmHg. Very Mild stenosis. Aorta dilated to 42 mm.  Stable.  RAP 3 mmHg   TRANSTHORACIC ECHOCARDIOGRAM  08/15/2020   EF 60 to 65%.  No R WMA.  GRII DD.  Mild Aortic Stenosis (mean gradient 10 mmHg).  Moderate to severe aortic root dilation (45 mm) normal IVC.  (Stable compared to February 2020)    Social History:  reports that she has never smoked. She has never used smokeless tobacco. She reports current alcohol use of about 7.0 standard drinks of alcohol per week. She reports that she does not use  drugs.  Allergies  Allergen Reactions   Amiodarone Other (See Comments)    Nausea, ataxia, weakness and chills.  Chest pain, dyspnea; near syncope   Dofetilide Other (See Comments)    Torsades and cardiac arrest during dofetilide initiation   Fentanyl Shortness Of Breath and Rash   Rosuvastatin Anaphylaxis, Anxiety, Hives, Itching, Nausea And Vomiting, Other (See Comments), Palpitations, Photosensitivity and Shortness Of Breath   Amoxicillin Diarrhea    Severe diarrhea, rash to vaginal area with swelling    Hctz [Hydrochlorothiazide] Other (See Comments)    HypoNatremia   Hydralazine Other (See Comments)    Weak, sweats, red skin   Codeine Other (See Comments)    makes her hyper   Conjugated Estrogens Itching and Rash   Erythromycin Rash    had a rash with emycin, has done ok with other meds in it's class   Piroxicam Itching and Rash    Feldene    Family History  Problem Relation Age of Onset   Arthritis Mother    Leukemia Father    Heart disease Maternal Uncle    Diabetes Paternal Aunt    Breast cancer Maternal Grandmother    Heart disease Maternal Grandfather    Breast cancer Paternal Grandmother    Colon cancer Neg Hx     Prior to Admission medications   Medication Sig Start Date End Date Taking? Authorizing Provider  acetaminophen (TYLENOL) 325 MG tablet Take 325 mg by mouth every 6 (six) hours as needed for moderate pain.   Yes [provider]  albuterol (VENTOLIN HFA) 108 (90 Base) MCG/ACT inhaler INHALE 2 PUFFS INTO THE LUNGS EVERY 6 HOURS AS NEEDED FOR WHEEZING OR SHORTNESS OF BREATH Patient taking differently: Inhale 2 puffs into the lungs every 6 (six) hours as needed for shortness of breath or wheezing. 01/21/22  Yes [name], MD  cloNIDine (CATAPRES) 0.2 MG tablet Take 0.1 mg by mouth 2 (two) times daily as needed (systolic over 235). if blood pressure is great than 361 systolic for more than an hour 08/20/21  Yes [name], MD   feeding supplement, GLUCERNA SHAKE, (GLUCERNA SHAKE) LIQD Take 237 mLs by mouth daily as needed (if not eating meals).   Yes [provider]  levothyroxine (SYNTHROID) 125 MCG tablet TAKE 1 TABLET(125 MCG) BY MOUTH DAILY Patient taking differently: Take 125 mcg by mouth daily. 01/21/22  Yes [name], MD  magnesium oxide (MAG-OX) 400 MG tablet Take 1 tablet (400 mg total) by mouth 2 (two) times daily. 05/15/22  Yes [name], MD  melatonin 5 MG TABS Take 1 tablet (5 mg total) by mouth at bedtime. 05/02/22  Yes Baldwin Jamaica, PA-C  metoprolol tartrate (LOPRESSOR) 25 MG tablet Take 25 mg tablet as needed if heart rate is above 100 after waiting for 10 minutes , if systolic blood pressure is less than 110 do not take metoprolol. Patient taking differently: Take 25 mg by mouth See admin instructions. Take 1 tablet (25 mg) as needed if heart rate is above 100 after waiting for 10 minutes , if systolic blood pressure is less than 110 DO NOT take metoprolol. 03/13/22  Yes [name], MD  montelukast (SINGULAIR) 10 MG tablet TAKE 1 TABLET(10 MG) BY MOUTH AT BEDTIME Patient taking differently: Take 10 mg by mouth at bedtime. 12/25/21  Yes [name], MD  nebivolol (BYSTOLIC) 10 MG tablet TAKE 1 TABLET(10 MG) BY MOUTH DAILY AT 8 PM Patient taking differently: Take 10 mg by mouth at bedtime. TAKE 1 TABLET(10 MG) BY MOUTH DAILY AT 8 PM 09/26/21  Yes [name], MD  QUEtiapine (SEROQUEL) 25 MG tablet Take 1 tablet (25 mg total) by mouth at bedtime. 05/02/22  Yes Baldwin Jamaica, PA-C  Rivaroxaban (XARELTO) 15 MG TABS tablet Take 1 tablet (15 mg total) by mouth daily with supper. STOP ELIQUIS Patient taking differently: Take 15 mg by mouth daily with supper. 01/07/22  Yes [name], MD  senna-docusate (SENOKOT-S) 8.6-50 MG tablet Take 1 tablet by mouth 2 (two) times daily. 05/02/22  Yes Baldwin Jamaica, PA-C  vancomycin (VANCOCIN) 125 MG capsule Take 1  capsule (125 mg total) by mouth 4 (four) times daily for 10 days. Patient taking differently: Take 125 mg by mouth 4 (four) times daily. 10 day course 05/17/22 05/27/22 Yes [name], MD  LYLLANA 0.05 MG/24HR patch Place 1 patch onto the skin daily as needed (hormonal). Patient not taking: Reported on 05/21/2022 09/03/21   [provider]  potassium chloride 20 MEQ/15ML (10%) SOLN Take 30 mLs (40 mEq total) by mouth 2 (two) times daily. Patient not taking: Reported on 05/21/2022 05/16/22   [name], MD  Respiratory Therapy Supplies (FLUTTER) DEVI Use as directed 11/18/16   Tanda Rockers, MD    Physical Exam: Vitals:   05/21/22 1056 05/21/22 1415 05/21/22 1427 05/21/22 1430  BP: (!) 159/73 (!) 166/78  (!) 175/109  Pulse: 60 60    Resp: 16 13  12   Temp:  97.8 F (36.6 C) 97.8 F (36.6 C)   TempSrc:  Oral Axillary   SpO2: 99% 99%  99%  Weight:      Height:       General: 76 y.o. female resting in bed in NAD Eyes: PERRL, normal sclera ENMT: Nares patent w/o discharge, orophaynx clear, dentition normal, ears w/o discharge/lesions/ulcers Neck: Supple, trachea midline Cardiovascular: brady, +S1, S2, no m/g/r, equal pulses throughout Respiratory: CTABL, no w/r/r, normal WOB GI: BS+, NDNT, no masses noted, no organomegaly noted MSK: No e/c/c Skin: No rashes, bruises, ulcerations noted Neuro: A&O x 3, no focal deficits Psyc: Appropriate interaction, somewhat confuse, but pleasant, calm/cooperative  Data Reviewed:  Lab Results  Component Value Date   NA 133 (L) 05/21/2022   K 2.5 (LL) 05/21/2022   CO2 20 (L) 05/21/2022   GLUCOSE 105 (H) 05/21/2022   BUN <5 (L) 05/21/2022   CREATININE 0.76 05/21/2022   CALCIUM 9.1 05/21/2022   EGFR 77 04/09/2021   GFRNONAA >60 05/21/2022   Mg2+  <1.2 Albumin 3.7  Lab Results  Component Value Date   WBC 5.0 05/21/2022  HGB 12.0 05/21/2022   HCT 34.1 (L) 05/21/2022   MCV 87.4 05/21/2022   PLT 244 05/21/2022    Assessment and Plan: Hypokalemia Hypomagnesemia Hyponatremia     - admitted to inpt, progressive     - replace K+, Mg2+; rpt lab ordered to check progress     - mild low Na+; fluids going follow  Diarrhea C diff infection     - c diff ordered     - outpt c diff test positive for toxic; she's symptomatic, let's continue vanc  Metabolic acidosis     - check lactic acid  HTN     - hold her bystolic as she is bradycardic     - add cozaar and PRN clonidine  HLD      - continue statin  Dementia      - continue home regimen      - delirium precautions  Hypothyroidism      - resume home regimen  A fib      - continue xarelto      - she's brady into the 75Z; holding bystolic for now  Advance Care Planning:   Code Status: DNR  Consults: None  Family Communication: w/ husband at bedside  Severity of Illness: The appropriate patient status for this patient is INPATIENT. Inpatient status is judged to be reasonable and necessary in order to provide the required intensity of service to ensure the patient's safety. The patient's presenting symptoms, physical exam findings, and initial radiographic and laboratory data in the context of their chronic comorbidities is felt to place them at high risk for further clinical deterioration. Furthermore, it is not anticipated that the patient will be medically stable for discharge from the hospital within 2 midnights of admission.   * I certify that at the point of admission it is my clinical judgment that the patient will require inpatient hospital care spanning beyond 2 midnights from the point of admission due to high intensity of service, high risk for further deterioration and high frequency of surveillance required.*  Author: Jonnie Finner, DO 05/21/2022 4:26 PM  For on call review [email or web address].

## 2022-05-21 NOTE — ED Notes (Signed)
KCL infusion #3 initiated

## 2022-05-21 NOTE — ED Notes (Signed)
Mg gtt initiated K supplement IVPB #1 initiated NSR with occ Unifocal PVC noted frequently

## 2022-05-21 NOTE — ED Notes (Signed)
Client very angry about being in the ED, wants to go to her daughters house, was being verbally abusive to husband and cussing at husband and being here, states why haven't I sent her to the hospital, explained care being given and that request for room assignment and bed request is being done per the protocols, Ativan given slow IV push per ED MD orders.

## 2022-05-22 DIAGNOSIS — A0472 Enterocolitis due to Clostridium difficile, not specified as recurrent: Secondary | ICD-10-CM

## 2022-05-22 DIAGNOSIS — E871 Hypo-osmolality and hyponatremia: Secondary | ICD-10-CM

## 2022-05-22 DIAGNOSIS — E876 Hypokalemia: Secondary | ICD-10-CM | POA: Diagnosis not present

## 2022-05-22 LAB — GASTROINTESTINAL PANEL BY PCR, STOOL (REPLACES STOOL CULTURE)

## 2022-05-22 LAB — CBC
HCT: 26.5 % — ABNORMAL LOW (ref 36.0–46.0)
Hemoglobin: 9.1 g/dL — ABNORMAL LOW (ref 12.0–15.0)
MCH: 31.1 pg (ref 26.0–34.0)
MCHC: 34.3 g/dL (ref 30.0–36.0)
MCV: 90.4 fL (ref 80.0–100.0)
Platelets: 159 10*3/uL (ref 150–400)
RBC: 2.93 MIL/uL — ABNORMAL LOW (ref 3.87–5.11)
RDW: 13.2 % (ref 11.5–15.5)
WBC: 4.6 10*3/uL (ref 4.0–10.5)
nRBC: 0 % (ref 0.0–0.2)

## 2022-05-22 LAB — COMPREHENSIVE METABOLIC PANEL
ALT: 11 U/L (ref 0–44)
AST: 16 U/L (ref 15–41)
Albumin: 2.7 g/dL — ABNORMAL LOW (ref 3.5–5.0)
Alkaline Phosphatase: 52 U/L (ref 38–126)
Anion gap: 7 (ref 5–15)
BUN: 6 mg/dL — ABNORMAL LOW (ref 8–23)
CO2: 20 mmol/L — ABNORMAL LOW (ref 22–32)
Calcium: 8.4 mg/dL — ABNORMAL LOW (ref 8.9–10.3)
Chloride: 112 mmol/L — ABNORMAL HIGH (ref 98–111)
Creatinine, Ser: 0.68 mg/dL (ref 0.44–1.00)
GFR, Estimated: >60 mL/min (ref >60)
Glucose, Bld: 88 mg/dL (ref 70–99)
Potassium: 3.5 mmol/L (ref 3.5–5.1)
Sodium: 139 mmol/L (ref 135–145)
Total Bilirubin: 0.6 mg/dL (ref 0.3–1.2)
Total Protein: 4.9 g/dL — ABNORMAL LOW (ref 6.5–8.1)

## 2022-05-22 LAB — C DIFFICILE QUICK SCREEN W PCR REFLEX
C Diff antigen: NEGATIVE
C Diff interpretation: NOT DETECTED
C Diff toxin: NEGATIVE

## 2022-05-22 LAB — MAGNESIUM: Magnesium: 1.5 mg/dL — ABNORMAL LOW (ref 1.7–2.4)

## 2022-05-22 MED ORDER — POTASSIUM CHLORIDE CRYS ER 20 MEQ PO TBCR
40.0000 meq | EXTENDED_RELEASE_TABLET | Freq: Once | ORAL | Status: AC
  Administered 2022-05-22: 40 meq via ORAL
  Filled 2022-05-22: qty 2

## 2022-05-22 MED ORDER — NEBIVOLOL HCL 10 MG PO TABS
10.0000 mg | ORAL_TABLET | Freq: Every day | ORAL | Status: DC
  Filled 2022-05-22: qty 1

## 2022-05-22 MED ORDER — ENSURE ENLIVE PO LIQD
237.0000 mL | Freq: Three times a day (TID) | ORAL | Status: DC
  Administered 2022-05-22 (×2): 237 mL via ORAL

## 2022-05-22 MED ORDER — ADULT MULTIVITAMIN W/MINERALS CH
1.0000 | ORAL_TABLET | Freq: Every day | ORAL | Status: DC
  Administered 2022-05-22 – 2022-05-23 (×2): 1 via ORAL
  Filled 2022-05-22 (×2): qty 1

## 2022-05-22 MED ORDER — MAGNESIUM SULFATE 4 GM/100ML IV SOLN
4.0000 g | Freq: Once | INTRAVENOUS | Status: AC
  Administered 2022-05-22: 4 g via INTRAVENOUS
  Filled 2022-05-22: qty 100

## 2022-05-22 MED ORDER — POTASSIUM CHLORIDE 2 MEQ/ML IV SOLN
INTRAVENOUS | Status: DC
  Filled 2022-05-22 (×4): qty 1000

## 2022-05-22 NOTE — Progress Notes (Addendum)
PROGRESS NOTE   Marissa [name]  ZOX:[id number]    DOB: Mar 04, 1946    DOA: 05/21/2022  PCP: [name], MD   I have briefly reviewed patients previous medical records in [facility].  Chief Complaint  Patient presents with   Abnormal Lab    Brief Narrative:  76 year old female with medical history significant for HTN, HLD, dementia, A-fib on Xarelto, stroke, hypothyroid, recent hospitalization 6/27 - 7/6 for persistent A-fib, failed Tikosyn load with V-fib arrest and delirium, discharged to rehab after hospital stay, presented to ED on 05/21/2022 with complaints of diarrhea, poor oral intake, diagnosed with C. difficile on 7/19, treated for severe hypokalemia and hypomagnesemia as outpatient but could not tolerate and hence was advised to come to the ED.  Admitted for severe hypokalemia and hypomagnesemia complicating diarrhea and poor oral intake.   Assessment & Plan:  Principal Problem:   Hypokalemia Active Problems:   Dementia (HCC)   Persistent atrial fibrillation ([place]): CHA2DS2-VASc Score = 7    Hypothyroidism   Dyslipidemia   Hyponatremia   Hypomagnesemia   C. difficile diarrhea   HTN (hypertension)   Hypokalemia: Presented with potassium of 2.5.  Secondary to diarrhea and poor oral intake.  Improved but still low normal, continue to replace and follow BMP in AM.  Replace hypomagnesemia.  Hypomagnesemia: Magnesium 1.2 on admission, up to 1.5 today.  Continue to replace IV aggressively with goal being close to 2.  Follow magnesium in AM.  Dehydration with hyponatremia: Hyponatremia resolved.  Still appears mildly dry.  Continue gentle IV fluids and encourage oral intake.  C. difficile diarrhea: Tested positive for C. difficile on 7/19.  Was initiated on oral vancomycin as outpatient.  Repeat CDiff testing 7/25 was negative.  Continue and complete course of oral vancomycin.  Diarrhea seems to be decreasing.  Will discuss with ID.  Non-anion gap metabolic  acidosis: Bicarb on admission: 16.  Lactate normal.  Secondary to diarrhea.  Improved.  Normocytic anemia: Appears that hemoglobin is at her baseline.  Outpatient follow-up.  Essential hypertension: Mildly uncontrolled.  Continue home dose of Cozaar.  Stable sinus bradycardia in the 50s-sinus rhythm on telemetry.  Bystolic which was held to be resumed and continue close telemetry monitoring and hold if heart rate less than 55.  Discontinue as needed clonidine which can also contribute to bradycardia.    Hyperlipidemia: Continue statins  Dementia: Pleasantly confused but not agitated.  Continue home Seroquel.  Hypothyroidism: Resume home regimen  A-fib, persistent: Currently in sinus bradycardia-SR.  Continue home Xarelto.  Resume Bystolic and continue telemetry monitoring.  Body mass index is 20.62 kg/m.    DVT prophylaxis:   On Xarelto anticoagulation.   Code Status: DNR:  Family Communication: None at bedside. Disposition:  Status is: Inpatient Remains inpatient appropriate because: Dehydration requiring IVF, severe electrolyte derangements requiring ongoing replacements and follow-up in the context of recent V-fib arrest, telemetry monitoring.  Hopeful DC in the next 24 to 48 hours pending stabilization and PT, OT input.     Consultants:     Procedures:     Antimicrobials:   Oral vancomycin 7/25.   Subjective:  Pleasantly confused.  Alert and oriented to self and partly to place.  States that she developed an infection because she was another facility.  Says that she feels better and comfortable.  As per RN, pleasantly confused, 3 small volume watery-loose stools overnight otherwise no acute issues.  Objective:   Vitals:   05/22/22 0400 05/22/22  0401 05/22/22 0500 05/22/22 0600  BP: (!) 164/99  (!) 149/47 (!) 151/60  Pulse: 60  (!) 54 (!) 52  Resp: (!) 21  16 16   Temp:  97.6 F (36.4 C)    TempSrc:  Oral    SpO2: 97%  93% 96%  Weight:      Height:         General exam: Elderly female, moderately built and frail, lying comfortably propped up in bed without distress.  Oral mucosa dry. Respiratory system: Clear to auscultation. Respiratory effort normal. Cardiovascular system: S1 & S2 heard, RRR. No JVD, murmurs, rubs, gallops or clicks. No pedal edema.  Telemetry personally reviewed: Mostly SB in the 50s-SR at other times.  No arrhythmias appreciated. Gastrointestinal system: Abdomen is nondistended, soft and nontender. No organomegaly or masses felt. Normal bowel sounds heard. Central nervous system: Alert and oriented to self and partly to place. No focal neurological deficits. Extremities: Symmetric 5 x 5 power. Skin: No rashes, lesions or ulcers Psychiatry: Judgement and insight impaired. Mood & affect appropriate.     Data Reviewed:   I have personally reviewed following labs and imaging studies   CBC: Recent Labs  Lab 05/21/22 1004 05/22/22 0312  WBC 5.0 4.6  NEUTROABS 2.9  --   HGB 12.0 9.1*  HCT 34.1* 26.5*  MCV 87.4 90.4  PLT 244 106    Basic Metabolic Panel: Recent Labs  Lab 05/21/22 1004 05/21/22 1452 05/21/22 2300 05/22/22 0312  NA 133* 133* 137 139  K 2.5* 3.3* 3.7 3.5  CL 101 102 110 112*  CO2 20* 16* 20* 20*  GLUCOSE 105* 88 95 88  BUN 9 7* 7* 6*  CREATININE 0.99 0.85 0.80 0.68  CALCIUM 9.1 8.6* 8.5* 8.4*  MG 1.2* 2.0  --  1.5*  PHOS  --   --  2.5  --     Liver Function Tests: Recent Labs  Lab 05/21/22 1004 05/21/22 2300 05/22/22 0312  AST 19  --  16  ALT 10  --  11  ALKPHOS 71  --  52  BILITOT 0.4  --  0.6  PROT 6.9  --  4.9*  ALBUMIN 3.7 2.9* 2.7*    CBG: No results for input(s): "GLUCAP" in the last 168 hours.  Microbiology Studies:   Recent Results (from the past 240 hour(s))  C Difficile Quick Screen w PCR reflex     Status: None   Collection Time: 05/21/22 11:52 PM   Specimen: STOOL  Result Value Ref Range Status   C Diff antigen NEGATIVE NEGATIVE Final   C Diff toxin  NEGATIVE NEGATIVE Final   C Diff interpretation No C. difficile detected.  Final    Comment: Performed at [facility], [place] [address], [place], [place] [address]    Radiology Studies:  No results found.  Scheduled Meds:    Chlorhexidine Gluconate Cloth  6 each Topical Q0600   Chlorhexidine Gluconate Cloth  6 each Topical Daily   levothyroxine  125 mcg Oral Q0600   losartan  25 mg Oral Daily   melatonin  5 mg Oral QHS   montelukast  10 mg Oral QHS   mupirocin ointment  1 Application Nasal BID   QUEtiapine  25 mg Oral QHS   Rivaroxaban  15 mg Oral Q supper   vancomycin  125 mg Oral QID    Continuous Infusions:    lactated ringers 75 mL/hr at 05/22/22 0615     LOS: 1 day  Vernell Leep, MD,  FACP, [facility], [facility], [facility] (Care Management Physician Certified) [facility] [place]  To contact the attending provider between 7A-7P or the covering provider during after hours 7P-7A, please log into the web site [email or web address] and access using universal [place] password for that web site. If you do not have the password, please call the hospital operator.  05/22/2022, 7:56 AM

## 2022-05-22 NOTE — Evaluation (Signed)
Physical Therapy Evaluation Patient Details Name: Marissa [name] MRN: [id number] DOB: April 14, 1946 Today's Date: 05/22/2022  History of Present Illness  76 year old female  PMH: HTN, HLD, dementia, A-fib on Xarelto, stroke, hypothyroid, recent hospitalization 6/27 - 7/6 for persistent A-fib, failed Tikosyn  with V-fib arrest and delirium pt was discharged to rehab after hospital stay, presented to ED on 05/21/2022 with complaints of diarrhea, poor oral intake, diagnosed with C. difficile on 7/19, treated for severe hypokalemia and hypomagnesemia  Clinical Impression  Pt admitted with above diagnosis.  Pt pleasantly confused, no family present at time of eval; pt reports amb with rollator at baseline.   Pt amb 140' with RW and min/guard, no LOB. VSS during PT session. Pt will need supervision as noted below d/t cognition.  Pt currently with functional limitations due to the deficits listed below (see PT Problem List). Pt will benefit from skilled PT to increase their independence and safety with mobility to allow discharge to the venue listed below.          Recommendations for follow up therapy are one component of a multi-disciplinary discharge planning process, led by the attending physician.  Recommendations may be updated based on patient status, additional functional criteria and insurance authorization.  Follow Up Recommendations No PT follow up      Assistance Recommended at Discharge Frequent or constant Supervision/Assistance  Patient can return home with the following  A little help with walking and/or transfers;A lot of help with bathing/dressing/bathroom;Assistance with cooking/housework;Assistance with feeding;Direct supervision/assist for medications management;Direct supervision/assist for financial management;Assist for transportation;Help with stairs or ramp for entrance    Equipment Recommendations None recommended by PT  Recommendations for Other Services        Functional Status Assessment Patient has had a recent decline in their functional status and demonstrates the ability to make significant improvements in function in a reasonable and predictable amount of time.     Precautions / Restrictions Precautions Precautions: Fall Restrictions Weight Bearing Restrictions: No      Mobility  Bed Mobility Overal bed mobility: Needs Assistance Bed Mobility: Supine to Sit     Supine to sit: Min guard, Supervision Sit to supine: Supervision, Min guard   General bed mobility comments: for safety, pt attempting to get OOB before lines free, easily redirected    Transfers Overall transfer level: Needs assistance Equipment used: Rolling walker (2 wheels) Transfers: Sit to/from Stand Sit to Stand: Min guard           General transfer comment: cues for overall safety    Ambulation/Gait Ambulation/Gait assistance: Min guard Gait Distance (Feet): 140 Feet Assistive device: Rolling walker (2 wheels) Gait Pattern/deviations: Step-through pattern       General Gait Details: pt able to converse, head turn while amb without LOB; amb ~ 5' without device, min guard for safety- no LOB  Stairs            Wheelchair Mobility    Modified [place] (Stroke Patients Only)       Balance Overall balance assessment: Needs assistance Sitting-balance support: Feet supported, No upper extremity supported Sitting balance-Leahy Scale: Good     Standing balance support: No upper extremity supported Standing balance-Leahy Scale: Fair                               Pertinent Vitals/Pain Pain Assessment Pain Assessment: No/denies pain    Home Living Family/patient expects to be  discharged to:: Private residence Living Arrangements: Spouse/significant other Available Help at Discharge: Family;Available 24 hours/day Type of Home: House Home Access: Stairs to enter Entrance Stairs-Rails: Left Entrance Stairs-Number of Steps:  3   Home Layout: Two level;Able to live on main level with bedroom/bathroom Home Equipment: Rolling Walker (2 wheels);Cane - single point;Shower seat;Hand held shower head Additional Comments: some info taken from recent hospital encounter; pt is not a reliable historian, no family present    Prior Function               Mobility Comments: pt reports amb with rollator; pt endorses strong hx of falls but unable to quantify       Hand Dominance        Extremity/Trunk Assessment   Upper Extremity Assessment Upper Extremity Assessment: Defer to OT evaluation    Lower Extremity Assessment Lower Extremity Assessment: Generalized weakness       Communication      Cognition Arousal/Alertness: Awake/alert Behavior During Therapy: WFL for tasks assessed/performed Overall Cognitive Status: No family/caregiver present to determine baseline cognitive functioning Area of Impairment: Orientation, Memory, Following commands                 Orientation Level: Disoriented to, Situation, Place, Time   Memory: Decreased short-term memory Following Commands: Follows one step commands consistently Safety/Judgement: Decreased awareness of safety              General Comments      Exercises     Assessment/Plan    PT Assessment Patient does not need any further PT services  PT Problem List Decreased strength;Decreased balance;Decreased activity tolerance;Decreased cognition;Decreased knowledge of use of DME;Decreased knowledge of precautions;Decreased safety awareness;Pain       PT Treatment Interventions DME instruction;Gait training;Stair training;Functional mobility training;Therapeutic activities;Therapeutic exercise;Balance training;Neuromuscular re-education;Cognitive remediation;Patient/family education    PT Goals (Current goals can be found in the Care Plan section)  Acute Rehab PT Goals Patient Stated Goal: to go home PT Goal Formulation: Patient unable to  participate in goal setting Time For Goal Achievement: 06/05/22    Frequency Min 3X/week     Co-evaluation               AM-PAC PT "6 Clicks" Mobility  Outcome Measure Help needed turning from your back to your side while in a flat bed without using bedrails?: None Help needed moving from lying on your back to sitting on the side of a flat bed without using bedrails?: None Help needed moving to and from a bed to a chair (including a wheelchair)?: A Little Help needed standing up from a chair using your arms (e.g., wheelchair or bedside chair)?: A Little Help needed to walk in hospital room?: A Little Help needed climbing 3-5 steps with a railing? : A Little 6 Click Score: 20    End of Session Equipment Utilized During Treatment: Gait belt Activity Tolerance: Patient tolerated treatment well Patient left: in bed;with call bell/phone within reach;with bed alarm set   PT Visit Diagnosis: Other abnormalities of gait and mobility (R26.89);Muscle weakness (generalized) (M62.81);Other symptoms and signs involving the nervous system (R29.898)    Time: 1539-1601 PT Time Calculation (min) (ACUTE ONLY): 22 min   Charges:   PT Evaluation $PT Eval Low Complexity: [place], PT  Acute Rehab Dept [facility]) [phone]  [facility] Weekend Pager [facility] only)  [phone]  05/22/2022   [facility] 05/22/2022, 4:33 PM
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
## 2022-05-23 ENCOUNTER — Other Ambulatory Visit (HOSPITAL_COMMUNITY): Payer: Self-pay

## 2022-05-23 DIAGNOSIS — A0472 Enterocolitis due to Clostridium difficile, not specified as recurrent: Secondary | ICD-10-CM | POA: Diagnosis not present

## 2022-05-23 DIAGNOSIS — E871 Hypo-osmolality and hyponatremia: Secondary | ICD-10-CM | POA: Diagnosis not present

## 2022-05-23 DIAGNOSIS — E876 Hypokalemia: Secondary | ICD-10-CM | POA: Diagnosis not present

## 2022-05-23 LAB — IRON AND TIBC
Iron: 48 ug/dL (ref 28–170)
Saturation Ratios: 17 % (ref 10.4–31.8)
TIBC: 286 ug/dL (ref 250–450)
UIBC: 238 ug/dL

## 2022-05-23 LAB — CBC
HCT: 29.6 % — ABNORMAL LOW (ref 36.0–46.0)
Hemoglobin: 10 g/dL — ABNORMAL LOW (ref 12.0–15.0)
MCH: 31.2 pg (ref 26.0–34.0)
MCHC: 33.8 g/dL (ref 30.0–36.0)
MCV: 92.2 fL (ref 80.0–100.0)
Platelets: 194 10*3/uL (ref 150–400)
RBC: 3.21 MIL/uL — ABNORMAL LOW (ref 3.87–5.11)
RDW: 13.6 % (ref 11.5–15.5)
WBC: 4 10*3/uL (ref 4.0–10.5)
nRBC: 0 % (ref 0.0–0.2)

## 2022-05-23 LAB — BASIC METABOLIC PANEL
Anion gap: 8 (ref 5–15)
BUN: 9 mg/dL (ref 8–23)
CO2: 23 mmol/L (ref 22–32)
Calcium: 8.7 mg/dL — ABNORMAL LOW (ref 8.9–10.3)
Chloride: 109 mmol/L (ref 98–111)
Creatinine, Ser: 0.55 mg/dL (ref 0.44–1.00)
GFR, Estimated: >60 mL/min (ref >60)
Glucose, Bld: 100 mg/dL — ABNORMAL HIGH (ref 70–99)
Potassium: 3.8 mmol/L (ref 3.5–5.1)
Sodium: 140 mmol/L (ref 135–145)

## 2022-05-23 LAB — MAGNESIUM
Magnesium: 1.5 mg/dL — ABNORMAL LOW (ref 1.7–2.4)
Magnesium: 2 mg/dL (ref 1.7–2.4)

## 2022-05-23 LAB — FOLATE: Folate: 9.5 ng/mL (ref >5.9)

## 2022-05-23 LAB — RETICULOCYTES
Immature Retic Fract: 9.6 % (ref 2.3–15.9)
RBC.: 3.15 MIL/uL — ABNORMAL LOW (ref 3.87–5.11)
Retic Count, Absolute: 56.1 10*3/uL (ref 19.0–186.0)
Retic Ct Pct: 1.8 % (ref 0.4–3.1)

## 2022-05-23 LAB — FERRITIN: Ferritin: 43 ng/mL (ref 11–307)

## 2022-05-23 LAB — VITAMIN B12: Vitamin B-12: 814 pg/mL (ref 180–914)

## 2022-05-23 LAB — VITAMIN D 25 HYDROXY (VIT D DEFICIENCY, FRACTURES): Vit D, 25-Hydroxy: 40.91 ng/mL (ref 30–100)

## 2022-05-23 MED ORDER — MUPIROCIN 2 % EX OINT
1.0000 | TOPICAL_OINTMENT | Freq: Two times a day (BID) | CUTANEOUS | 0 refills | Status: AC
  Filled 2022-05-23: qty 22, 11d supply, fill #0

## 2022-05-23 MED ORDER — MAGNESIUM SULFATE 4 GM/100ML IV SOLN
4.0000 g | Freq: Once | INTRAVENOUS | Status: AC
  Administered 2022-05-23: 4 g via INTRAVENOUS
  Filled 2022-05-23: qty 100

## 2022-05-23 MED ORDER — ADULT MULTIVITAMIN W/MINERALS CH: 1.0000 | ORAL_TABLET | Freq: Every day | ORAL | Status: DC

## 2022-05-23 MED ORDER — LOSARTAN POTASSIUM 25 MG PO TABS
25.0000 mg | ORAL_TABLET | Freq: Every day | ORAL | 1 refills | Status: DC
  Filled 2022-05-23: qty 30, 30d supply, fill #0

## 2022-05-23 MED ORDER — VANCOMYCIN HCL 125 MG PO CAPS
125.0000 mg | ORAL_CAPSULE | Freq: Four times a day (QID) | ORAL | 0 refills | Status: AC
  Filled 2022-05-23 (×2): qty 40, 10d supply, fill #0

## 2022-05-23 NOTE — Progress Notes (Signed)
Brief Pharmacy Note  Prescription for oral vancomycin x 10 days sent to pharmacy, however too early to fill per insurance given last filled outpatient x 10 day supply ~ 7/21. Per ID, patient will need 10 days from start date 7/25 pm when patient came in. I spoke with husband and they still have some medication at home (6-7 days worth). Per okay from Dr. Algis Liming, prescription for 3 days of vancomycin called into [facility] to complete ~ 10 days of oral vancomycin. Husband will continue to give her the home supply (125 mg four times daily) and is aware that I have called in a prescription for an additional 3 days supply to [facility] for him to pick up around Sunday-Monday when insurance will approve fill again.  Plan communicated with nurse prior to discharging patient.   Tawnya Crook, PharmD, BCPS Clinical Pharmacist 05/23/2022 1:42 PM

## 2022-05-23 NOTE — Discharge Instructions (Signed)

## 2022-05-23 NOTE — TOC Initial Note (Signed)
Transition of Care [facility]) - Initial/Assessment Note    Patient Details  Name: Marissa [name] MRN: [id number] Date of Birth: [birth year]  Transition of Care [facility]) CM/SW Contact:    Leeroy Cha, RN Phone Number: 05/23/2022, 8:34 AM  Clinical Narrative:                 Pt is from [facility].  Pleasantly confused but aware of place ,person and situation.  Plan is to return to [place] once confirmed with facility or husband.  Expected Discharge Plan: [place] (from [place] per the last cm note [id number]) Barriers to Discharge: Continued Medical Work up   Patient Goals and CMS Choice Patient states their goals for this hospitalization and ongoing recovery are:: confused unable to state CMS Medicare.gov Compare Post Acute Care list provided to:: Patient Represenative (must comment) (husband) Choice offered to / list presented to : Spouse  Expected Discharge Plan and Services Expected Discharge Plan: [place] (from [place] per the last cm note [id number])   Discharge Planning Services: CM Consult Post Acute Care Choice: [place] arrangements for the past 2 months: [place]                                      Prior Living Arrangements/Services Living arrangements for the past 2 months: [place] Lives with:: Facility Resident Patient language and need for interpreter reviewed:: Yes Do you feel safe going back to the place where you live?: Yes            Criminal Activity/Legal Involvement Pertinent to Current Situation/Hospitalization: No - Comment as needed  Activities of Daily Living Home Assistive Devices/Equipment: Environmental consultant (specify type), Cane (specify quad or straight), Eyeglasses (per previous hx pt used cane, walker, and eyeglasses - she is unable to accurately state what equipment she uses at present time) ADL Screening (condition at time of admission) Patient's cognitive  ability adequate to safely complete daily activities?: Yes Is the patient deaf or have difficulty hearing?: No Does the patient have difficulty seeing, even when wearing glasses/contacts?: No Does the patient have difficulty concentrating, remembering, or making decisions?: Yes Patient able to express need for assistance with ADLs?: Yes Does the patient have difficulty dressing or bathing?: Yes Independently performs ADLs?: No Communication: Independent Dressing (OT): Needs assistance Is this a change from baseline?: Pre-admission baseline Grooming: Independent Feeding: Independent Bathing: Needs assistance Is this a change from baseline?: Pre-admission baseline Toileting: Needs assistance Is this a change from baseline?: Pre-admission baseline In/Out Bed: Needs assistance Is this a change from baseline?: Pre-admission baseline Walks in Home: Needs assistance Is this a change from baseline?: Pre-admission baseline Does the patient have difficulty walking or climbing stairs?: Yes Weakness of Legs: Both  Permission Sought/Granted                  Emotional Assessment Appearance:: Appears stated age Attitude/Demeanor/Rapport: Other (comment) (pleasantly confused) Affect (typically observed): Accepting Orientation: : Oriented to Self, Oriented to Situation, Oriented to Place Alcohol / Substance Use: Never Used Psych Involvement: No (comment)  Admission diagnosis:  Hypokalemia [E87.6] Hypomagnesemia [E83.42] Patient Active Problem List   Diagnosis Date Noted   C. difficile diarrhea 05/21/2022   HTN (hypertension) 05/21/2022   Diarrhea 05/17/2022   Acute urinary retention 04/29/2022   Dementia ([employer]) 04/25/2022   Drug-induced torsades de pointes ([place]) 04/24/2022   Cardiac arrest (  [place]) 04/24/2022   Ataxia 03/04/2022   Aortic atherosclerosis ([place]) 12/29/2021   Costochondritis 12/26/2021   Adverse effect of drug 12/26/2021   Persistent atrial fibrillation ([place]):  CHA2DS2-VASc Score = 7  08/31/2021   Secondary hypercoagulable state ([place]) 08/31/2021   Solitary pulmonary nodule 08/17/2021   Fall at home, initial encounter 63/87/5643   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[place]) 32/95/1884   Chronic cough 06/01/2021   Memory loss 07/15/2020   Severe protein-calorie malnutrition ([place]) 12/22/2018   Hypokalemia 12/22/2018   Hypomagnesemia 12/22/2018   Hypophosphatemia 12/22/2018   Anxiety    Prolonged QT interval 12/21/2018   Hyponatremia 12/21/2018   Dyslipidemia 10/02/2018   Encounter for chronic pain management 11/28/2017   Dyspnea on exertion 06/01/2017   Mild aortic stenosis 05/27/2017   Routine general medical examination at a health care facility 07/09/2016   Frequent falls 03/28/2016   Rosacea 08/18/2015   Lumbar radiculopathy 07/07/2015   Urinary incontinence 06/20/2015   Hemorrhoids 10/06/2014   Cough variant asthma vs UACS 08/03/2014   Labile hypertension 02/22/2014   Insomnia secondary to chronic pain 07/09/2012   B12 deficiency 04/03/2011   Unspecified vitamin D deficiency 07/25/2010   ANEMIA, IRON DEFICIENCY 04/25/2009   Allergic rhinitis 02/01/2009   Mild dilation of ascending aorta (HCC) 12/27/2008   Chronic fatigue syndrome 04/21/2008   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

## 2022-05-23 NOTE — TOC Progression Note (Addendum)
Transition of Care [facility]) - Progression Note    Patient Details  Name: Thressa Shiffer MRN: [id number] Date of Birth: April 30, 1946  Transition of Care [facility]) CM/SW Contact  Leeroy Cha, RN Phone Number: 05/23/2022, 9:52 AM  Clinical Narrative:    Allie Bossier whitney-message left to please return call for patient return.  Will update the fl2. [facility] this patient is not from facility she is frm home.  [place] notified. [employer] medicare auth started via fax. [employer] auth discontinued via phone.  Husband is taking patient home via car.  Expected Discharge Plan: [place] (from [place] per the last cm note [id number]) Barriers to Discharge: Continued Medical Work up  Expected Discharge Plan and Services Expected Discharge Plan: [place] (from [place] per the last cm note [id number])   Discharge Planning Services: CM Consult Post Acute Care Choice: [place] arrangements for the past 2 months: Skilled Nursing Facility                                       Social Determinants of Health (SDOH) Interventions    Readmission Risk Interventions     No data to display

## 2022-05-23 NOTE — NC FL2 (Signed)
[place] LEVEL OF CARE SCREENING TOOL     IDENTIFICATION  Patient Name: Marissa [name] Birthdate: [birth year] Sex: female Admission Date (Current Location): 05/21/2022  [facility] and [place] Number:  Herbalist and Address:  [facility],  [place], [place]      Provider Number: [id number]  Attending Physician Name and Address:  Modena Jansky, MD  Relative Name and Phone Number:       Current Level of Care: Hospital Recommended Level of Care: [place] Prior Approval Number:    Date Approved/Denied:   PASRR Number: [id number]  Discharge Plan: SNF    Current Diagnoses: Patient Active Problem List   Diagnosis Date Noted   C. difficile diarrhea 05/21/2022   HTN (hypertension) 05/21/2022   Diarrhea 05/17/2022   Acute urinary retention 04/29/2022   Dementia ([place]) 04/25/2022   Drug-induced torsades de pointes ([place]) 04/24/2022   Cardiac arrest ([place]) 04/24/2022   Ataxia 03/04/2022   Aortic atherosclerosis ([place]) 12/29/2021   Costochondritis 12/26/2021   Adverse effect of drug 12/26/2021   Persistent atrial fibrillation ([place]): CHA2DS2-VASc Score = 7  08/31/2021   Secondary hypercoagulable state ([place]) 08/31/2021   Solitary pulmonary nodule 08/17/2021   Fall at home, initial encounter 69/67/8938   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[place]) 08/13/5101   Chronic cough 06/01/2021   Memory loss 07/15/2020   Severe protein-calorie malnutrition ([place]) 12/22/2018   Hypokalemia 12/22/2018   Hypomagnesemia 12/22/2018   Hypophosphatemia 12/22/2018   Anxiety    Prolonged QT interval 12/21/2018   Hyponatremia 12/21/2018   Dyslipidemia 10/02/2018   Encounter for chronic pain management 11/28/2017   Dyspnea on exertion 06/01/2017   Mild aortic stenosis 05/27/2017   Routine general medical examination at a health care facility 07/09/2016   Frequent falls 03/28/2016   Rosacea 08/18/2015   Lumbar radiculopathy 07/07/2015    Urinary incontinence 06/20/2015   Hemorrhoids 10/06/2014   Cough variant asthma vs UACS 08/03/2014   Labile hypertension 02/22/2014   Insomnia secondary to chronic pain 07/09/2012   B12 deficiency 04/03/2011   Unspecified vitamin D deficiency 07/25/2010   ANEMIA, IRON DEFICIENCY 04/25/2009   Allergic rhinitis 02/01/2009   Mild dilation of ascending aorta (HCC) 12/27/2008   Chronic fatigue syndrome 04/21/2008   Major depression in partial remission ([place]) 01/19/2008   Osteoarthritis 01/19/2008   Hypothyroidism 03/13/2007   Asthma 03/13/2007   Fibromyalgia 03/13/2007    Orientation RESPIRATION BLADDER Height & Weight     Self, Situation, Place  Normal Continent Weight: 54.5 kg Height:  5' 4"  (162.6 cm)  BEHAVIORAL SYMPTOMS/MOOD NEUROLOGICAL BOWEL NUTRITION STATUS      Continent Diet (regular)  AMBULATORY STATUS COMMUNICATION OF NEEDS Skin   Extensive Assist Verbally Normal                       Personal Care Assistance Level of Assistance  Bathing, Feeding, Dressing Bathing Assistance: Maximum assistance Feeding assistance: Limited assistance Dressing Assistance: Limited assistance     Functional Limitations Info  Sight, Hearing, Speech Sight Info: Adequate Hearing Info: Adequate Speech Info: Adequate    SPECIAL CARE FACTORS FREQUENCY  PT (By licensed PT), OT (By licensed OT)     PT Frequency: 5 x weekkly OT Frequency: 5 x weekly            Contractures Contractures Info: Not present    Additional Factors Info  Code Status Code Status Info: DNR Allergies Info: see avs and dc summary  multiple           Current Medications (05/23/2022):  This is the current hospital active medication list Current Facility-Administered Medications  Medication Dose Route Frequency Provider Last Rate Last Admin   acetaminophen (TYLENOL) tablet 650 mg  650 mg Oral Q6H PRN Marylyn Ishihara, Tyrone A, DO   650 mg at 05/22/22 1646   Or   acetaminophen (TYLENOL) suppository 650 mg  650  mg Rectal Q6H PRN Marylyn Ishihara, Tyrone A, DO       albuterol (PROVENTIL) (2.5 MG/3ML) 0.083% nebulizer solution 2.5 mg  2.5 mg Nebulization Q6H PRN Marylyn Ishihara, Tyrone A, DO       Chlorhexidine Gluconate Cloth 2 % PADS 6 each  6 each Topical Q0600 Marylyn Ishihara, Tyrone A, DO       Chlorhexidine Gluconate Cloth 2 % PADS 6 each  6 each Topical Daily Kyle, Tyrone A, DO   6 each at 05/22/22 1327   feeding supplement (ENSURE ENLIVE / ENSURE PLUS) liquid 237 mL  237 mL Oral TID BM Hongalgi, Anand D, MD   237 mL at 05/22/22 2117   feeding supplement (GLUCERNA SHAKE) (GLUCERNA SHAKE) liquid 237 mL  237 mL Oral Daily PRN Marylyn Ishihara, Tyrone A, DO       levothyroxine (SYNTHROID) tablet 125 mcg  125 mcg Oral Q0600 Marylyn Ishihara, Tyrone A, DO   125 mcg at 05/23/22 0510   losartan (COZAAR) tablet 25 mg  25 mg Oral Daily Kyle, Tyrone A, DO   25 mg at 05/22/22 0919   melatonin tablet 5 mg  5 mg Oral QHS Kyle, Tyrone A, DO   5 mg at 05/22/22 2116   montelukast (SINGULAIR) tablet 10 mg  10 mg Oral QHS Kyle, Tyrone A, DO   10 mg at 05/22/22 2116   multivitamin with minerals tablet 1 tablet  1 tablet Oral Daily Modena Jansky, MD   1 tablet at 05/22/22 1448   mupirocin ointment (BACTROBAN) 2 % 1 Application  1 Application Nasal BID Cherylann Ratel A, DO   1 Application at 95/09/32 2119   nebivolol (BYSTOLIC) tablet 10 mg  10 mg Oral QHS Hongalgi, Lenis Dickinson, MD       Oral care mouth rinse  15 mL Mouth Rinse PRN Marylyn Ishihara, Tyrone A, DO       prochlorperazine (COMPAZINE) injection 10 mg  10 mg Intravenous Q6H PRN Marylyn Ishihara, Tyrone A, DO       QUEtiapine (SEROQUEL) tablet 25 mg  25 mg Oral QHS Kyle, Tyrone A, DO   25 mg at 05/22/22 2117   Rivaroxaban (XARELTO) tablet 15 mg  15 mg Oral Q supper Marylyn Ishihara, Tyrone A, DO   15 mg at 05/22/22 1646   vancomycin (VANCOCIN) capsule 125 mg  125 mg Oral QID Marylyn Ishihara, Tyrone A, DO   125 mg at 05/22/22 2120     Discharge Medications: Please see discharge summary for a list of discharge medications.  Relevant Imaging Results:  Relevant  Lab Results:   Additional Information SSN: [id number]  Leeroy Cha, RN

## 2022-05-23 NOTE — Discharge Summary (Signed)
Physician Discharge Summary  Marissa [name] JOI:[id number] DOB: 05/16/1946  PCP: [name], MD  Admitted from: Home Discharged to: Home  Admit date: 05/21/2022 Discharge date: 05/23/2022  Recommendations for Outpatient Follow-up:    Follow-up Information     [name], MD. Schedule an appointment as soon as possible for a visit in 1 week(s).   Specialty: Internal Medicine Why: To be seen with repeat labs (CBC, CMP & magnesium) Contact information: [place] [address] [phone]         [name], MD .   Specialty: Cardiology Contact information: [address] [place] [address] [phone]         [name], MD .   Specialties: Cardiology, Radiology Contact information: [place] [address] [place]: None    Equipment/Devices: None    Discharge Condition: Improved and stable   Code Status: DNR Diet recommendation:  Discharge Diet Orders (From admission, onward)     Start     Ordered   05/23/22 0000  Diet - low sodium heart healthy        05/23/22 1201             Discharge Diagnoses:  Principal Problem:   Hypokalemia Active Problems:   Dementia ([place])   Persistent atrial fibrillation ([place]): CHA2DS2-VASc Score = 7    Hypothyroidism   Dyslipidemia   Hyponatremia   Hypomagnesemia   C. difficile diarrhea   HTN (hypertension)   Brief Summary: 76 year old female with medical history significant for HTN, HLD, dementia, A-fib on Xarelto, stroke, hypothyroid, recent hospitalization 6/27 - 7/6 for persistent A-fib, failed Tikosyn load with V-fib arrest and delirium, discharged to rehab after hospital stay, presented to ED on 05/21/2022 with complaints of diarrhea, poor oral intake, diagnosed with C. difficile on 7/19, treated for severe hypokalemia and hypomagnesemia as outpatient but could not tolerate  and hence was advised to come to the ED.  Admitted for severe hypokalemia and hypomagnesemia complicating diarrhea and poor oral intake.     Assessment & Plan:   Hypokalemia: Presented with potassium of 2.5.  Secondary to diarrhea and poor oral intake.  Aggressively replaced and corrected.  Hypomagnesemia replaced.  Hypomagnesemia: Magnesium 1.2 on admission.  Replaced IV aggressively.  Magnesium up to 2.0 at time of discharge.  Dehydration with hyponatremia: Resolved after IV fluids.  Tolerating oral intake well.   C. difficile diarrhea: Tested positive for C. difficile on 7/19.  Was initiated on oral vancomycin as outpatient.  Repeat CDiff testing 7/25 was negative.  As confirmed with nursing, diarrhea has improved or even resolved.  Had formed stools this morning.  As communicated with infectious disease MD on-call yesterday, reset the start date for 10 days of vancomycin from 10/25 consistent with clinical improvement.  Discussed in detail with patient's spouse and encouraged probiotic yogurts OTC.  Close outpatient follow-up.  Non-anion gap metabolic acidosis: Bicarb on admission: 16.  Lactate normal.  Secondary to diarrhea.  Resolved.  Normocytic anemia: Appears that hemoglobin is at her baseline.  Registered dietitian had requested anemia panel which shows ferritin of 43 suggesting iron deficiency.  Consider outpatient evaluation as deemed necessary and starting iron supplements during follow-up with PCP.    Essential hypertension: Mildly uncontrolled.  Continue home dose of Cozaar and Bystolic.  Also continue prior home  dose of as needed clonidine for hypertension and as needed metoprolol for tachycardia as prescribed by her cardiologist with outpatient follow-up with cardiology.    Hyperlipidemia: Continue statins  Dementia: Pleasantly confused but not agitated.  Continue home Seroquel.  Hypothyroidism: Resume home regimen  A-fib, persistent: Currently in SB in the 23s.   Continue home Xarelto.  Continue Bystolic   Body mass index is 20.62 kg/m.       Consultants:   None   Procedures:   None   Discharge Instructions  Discharge Instructions     Call MD for:  difficulty breathing, headache or visual disturbances   Complete by: As directed    Call MD for:  extreme fatigue   Complete by: As directed    Call MD for:  persistant dizziness or light-headedness   Complete by: As directed    Call MD for:  persistant nausea and vomiting   Complete by: As directed    Call MD for:  severe uncontrolled pain   Complete by: As directed    Call MD for:  temperature >100.4   Complete by: As directed    Diet - low sodium heart healthy   Complete by: As directed    Increase activity slowly   Complete by: As directed         Medication List     STOP taking these medications    Lyllana 0.05 MG/24HR patch Generic drug: estradiol   potassium chloride 20 MEQ/15ML (10%) Soln   senna-docusate 8.6-50 MG tablet Commonly known as: Senokot-S       TAKE these medications    acetaminophen 325 MG tablet Commonly known as: TYLENOL Take 325 mg by mouth every 6 (six) hours as needed for moderate pain.   albuterol 108 (90 Base) MCG/ACT inhaler Commonly known as: VENTOLIN HFA INHALE 2 PUFFS INTO THE LUNGS EVERY 6 HOURS AS NEEDED FOR WHEEZING OR SHORTNESS OF BREATH   cloNIDine 0.2 MG tablet Commonly known as: CATAPRES Take 0.1 mg by mouth 2 (two) times daily as needed (systolic over 728). if blood pressure is great than 206 systolic for more than an hour   feeding supplement (GLUCERNA SHAKE) Liqd Take 237 mLs by mouth daily as needed (if not eating meals).   Flutter Devi Use as directed   levothyroxine 125 MCG tablet Commonly known as: SYNTHROID TAKE 1 TABLET(125 MCG) BY MOUTH DAILY What changed: See the new instructions.   losartan 25 MG tablet Commonly known as: COZAAR Take 1 tablet by mouth daily. Start taking on: May 24, 2022    magnesium oxide 400 MG tablet Commonly known as: MAG-OX Take 1 tablet (400 mg total) by mouth 2 (two) times daily.   melatonin 5 MG Tabs Take 1 tablet (5 mg total) by mouth at bedtime.   metoprolol tartrate 25 MG tablet Commonly known as: LOPRESSOR Take 25 mg tablet as needed if heart rate is above 100 after waiting for 10 minutes , if systolic blood pressure is less than 110 do not take metoprolol. What changed:  how much to take how to take this when to take this additional instructions   montelukast 10 MG tablet Commonly known as: SINGULAIR TAKE 1 TABLET(10 MG) BY MOUTH AT BEDTIME What changed: See the new instructions.   multivitamin with minerals Tabs tablet Take 1 tablet by mouth daily. Start taking on: May 24, 2022   mupirocin ointment 2 % Commonly known as: BACTROBAN Place into the nose 2 times daily for 3 days.  nebivolol 10 MG tablet Commonly known as: BYSTOLIC TAKE 1 CWUGQB(16 MG) BY MOUTH DAILY AT 8 PM What changed: See the new instructions.   QUEtiapine 25 MG tablet Commonly known as: SEROQUEL Take 1 tablet (25 mg total) by mouth at bedtime.   Rivaroxaban 15 MG Tabs tablet Commonly known as: XARELTO Take 1 tablet (15 mg total) by mouth daily with supper. STOP ELIQUIS What changed: additional instructions   vancomycin 125 MG capsule Commonly known as: VANCOCIN Take 1 capsule by mouth 4 times daily for 10 days. What changed: additional instructions       Allergies  Allergen Reactions   Amiodarone Other (See Comments)    Nausea, ataxia, weakness and chills.  Chest pain, dyspnea; near syncope   Dofetilide Other (See Comments)    Torsades and cardiac arrest during dofetilide initiation   Fentanyl Shortness Of Breath and Rash   Rosuvastatin Anaphylaxis, Anxiety, Hives, Itching, Nausea And Vomiting, Other (See Comments), Palpitations, Photosensitivity and Shortness Of Breath   Amoxicillin Diarrhea    Severe diarrhea, rash to vaginal area with  swelling    Hctz [Hydrochlorothiazide] Other (See Comments)    HypoNatremia   Hydralazine Other (See Comments)    Weak, sweats, red skin   Codeine Other (See Comments)    makes her hyper   Conjugated Estrogens Itching and Rash   Erythromycin Rash    had a rash with emycin, has done ok with other meds in it's class   Piroxicam Itching and Rash    Feldene      Procedures/Studies: None    Subjective: Overall poor historian but consistently tells me that she feels much better and that her diarrhea has improved or even resolved.  This was confirmed with nursing who reported patient having a formed BM this morning.  Tolerating diet well.  Discharge Exam:  Vitals:   05/22/22 2129 05/23/22 0007 05/23/22 0507 05/23/22 1302  BP: (!) 178/86 (!) 151/88 (!) 168/97 (!) 154/87  Pulse: (!) 52 65 60 64  Resp:  16 17 20   Temp: 98.6 F (37 C) 97.7 F (36.5 C) 97.7 F (36.5 C) 97.6 F (36.4 C)  TempSrc: Oral Oral Oral Oral  SpO2: 100% 99% 100% 99%  Weight:      Height:        General exam: Elderly female, moderately built and frail, lying comfortably propped up in bed without distress.  Oral mucosa moist. Respiratory system: Clear to auscultation. Respiratory effort normal. Cardiovascular system: S1 & S2 heard, RRR. No JVD, murmurs, rubs, gallops or clicks. No pedal edema.  Telemetry personally reviewed: Mostly sinus bradycardia in the 50s. Gastrointestinal system: Abdomen is nondistended, soft and nontender. No organomegaly or masses felt. Normal bowel sounds heard. Central nervous system: Alert and oriented to self and partly to place. No focal neurological deficits. Extremities: Symmetric 5 x 5 power. Skin: No rashes, lesions or ulcers Psychiatry: Judgement and insight impaired. Mood & affect appropriate.    The results of significant diagnostics from this hospitalization (including imaging, microbiology, ancillary and laboratory) are listed below for reference.      Microbiology: Recent Results (from the past 240 hour(s))  C Difficile Quick Screen w PCR reflex     Status: None   Collection Time: 05/21/22 11:52 PM   Specimen: STOOL  Result Value Ref Range Status   C Diff antigen NEGATIVE NEGATIVE Final   C Diff toxin NEGATIVE NEGATIVE Final   C Diff interpretation No C. difficile detected.  Final  Comment: Performed at [facility], [place] [address] [place], [place] [address]  Gastrointestinal Panel by PCR , Stool     Status: None   Collection Time: 05/21/22 11:52 PM   Specimen: Stool  Result Value Ref Range Status   Campylobacter species NOT DETECTED NOT DETECTED Final   Plesimonas shigelloides NOT DETECTED NOT DETECTED Final   Salmonella species NOT DETECTED NOT DETECTED Final   Yersinia enterocolitica NOT DETECTED NOT DETECTED Final   Vibrio species NOT DETECTED NOT DETECTED Final   Vibrio cholerae NOT DETECTED NOT DETECTED Final   Enteroaggregative E coli (EAEC) NOT DETECTED NOT DETECTED Final   Enteropathogenic E coli (EPEC) NOT DETECTED NOT DETECTED Final   Enterotoxigenic E coli (ETEC) NOT DETECTED NOT DETECTED Final   Shiga like toxin producing E coli (STEC) NOT DETECTED NOT DETECTED Final   Shigella/Enteroinvasive E coli (EIEC) NOT DETECTED NOT DETECTED Final   Cryptosporidium NOT DETECTED NOT DETECTED Final   Cyclospora cayetanensis NOT DETECTED NOT DETECTED Final   Entamoeba histolytica NOT DETECTED NOT DETECTED Final   Giardia lamblia NOT DETECTED NOT DETECTED Final   Adenovirus F40/41 NOT DETECTED NOT DETECTED Final   Astrovirus NOT DETECTED NOT DETECTED Final   Norovirus GI/GII NOT DETECTED NOT DETECTED Final   Rotavirus A NOT DETECTED NOT DETECTED Final   Sapovirus (I, II, IV, and V) NOT DETECTED NOT DETECTED Final    Comment: Performed at [facility], [address] [place], [place] [address]     Labs: CBC: Recent Labs  Lab 05/21/22 1004 05/22/22 0312 05/23/22 0426  WBC 5.0 4.6 4.0   NEUTROABS 2.9  --   --   HGB 12.0 9.1* 10.0*  HCT 34.1* 26.5* 29.6*  MCV 87.4 90.4 92.2  PLT 244 159 031    Basic Metabolic Panel: Recent Labs  Lab 05/21/22 1004 05/21/22 1452 05/21/22 2300 05/22/22 0312 05/23/22 0426 05/23/22 1245  NA 133* 133* 137 139 140  --   K 2.5* 3.3* 3.7 3.5 3.8  --   CL 101 102 110 112* 109  --   CO2 20* 16* 20* 20* 23  --   GLUCOSE 105* 88 95 88 100*  --   BUN 9 7* 7* 6* 9  --   CREATININE 0.99 0.85 0.80 0.68 0.55  --   CALCIUM 9.1 8.6* 8.5* 8.4* 8.7*  --   MG 1.2* 2.0  --  1.5* 1.5* 2.0  PHOS  --   --  2.5  --   --   --     Liver Function Tests: Recent Labs  Lab 05/21/22 1004 05/21/22 2300 05/22/22 0312  AST 19  --  16  ALT 10  --  11  ALKPHOS 71  --  52  BILITOT 0.4  --  0.6  PROT 6.9  --  4.9*  ALBUMIN 3.7 2.9* 2.7*     Anemia work up Recent Labs    05/23/22 0426  VITAMINB12 814  FOLATE 9.5  FERRITIN 43  TIBC 286  IRON 48  RETICCTPCT 1.8    Discussed in detail with patient's spouse, updated care and answered all questions.  Time coordinating discharge: 35 minutes  SIGNED:  Vernell Leep, MD,  FACP, [facility], [facility], [facility] (Care Management Physician Certified). [facility] Advisor  To contact the attending provider between 7A-7P or the covering provider during after hours 7P-7A, please log into the web site [email or web address] and access using universal [place] password for that web site. If  you do not have the password, please call the hospital operator.

## 2022-05-24 ENCOUNTER — Encounter: Payer: Self-pay | Admitting: *Deleted

## 2022-05-24 ENCOUNTER — Telehealth: Payer: Self-pay | Admitting: *Deleted

## 2022-05-24 ENCOUNTER — Encounter: Payer: Self-pay | Admitting: Cardiology

## 2022-05-24 ENCOUNTER — Other Ambulatory Visit: Payer: Self-pay

## 2022-05-24 DIAGNOSIS — I4819 Other persistent atrial fibrillation: Secondary | ICD-10-CM

## 2022-05-24 DIAGNOSIS — I159 Secondary hypertension, unspecified: Secondary | ICD-10-CM

## 2022-05-24 DIAGNOSIS — I469 Cardiac arrest, cause unspecified: Secondary | ICD-10-CM

## 2022-05-24 DIAGNOSIS — A0472 Enterocolitis due to Clostridium difficile, not specified as recurrent: Secondary | ICD-10-CM

## 2022-05-24 NOTE — Patient Outreach (Signed)
  Care Coordination [facility] Note Transition Care Management Follow-up Telephone Call Date of discharge and from where: 05/23/22: [facility] How have you been since you were released from the hospital? Per husband/ caregiver Ed, on [facility] DPR: "she is much stronger and is feeling better than she has in months" Any questions or concerns? Yes; husband reports initial confusion around discharge medication changes: hospital discharge instructions/ medication changes reviewed; medication changes clarified  Items Reviewed: Did the pt receive and understand the discharge instructions provided? Yes  Medications obtained and verified? Yes  Other?  N/A Any new allergies since your discharge? No  Dietary orders reviewed? Yes Do you have support at home? Yes   Home Care and Equipment/Supplies: Were home health services ordered? no If so, what is the name of the agency? N/A  Has the agency set up a time to come to the patient's home? not applicable Were any new equipment or medical supplies ordered?  No What is the name of the medical supply agency? N/A Were you able to get the supplies/equipment? not applicable Do you have any questions related to the use of the equipment or supplies? No N/A  Functional Questionnaire: (I = Independent and D = Dependent) ADLs: I; husband assists as indicated post-hospital discharge  Bathing/Dressing- I; husband supervises as indicated  Meal Prep- I; husband assists as indicated  Eating- I  Maintaining continence- I; wears depends  Transferring/Ambulation- I  Managing Meds- D: husband manages all aspects of medication administration  Follow up appointments reviewed:  PCP Hospital f/u appt confirmed? Yes  Scheduled to see PCP on 06/10/22 @ 3:40 pm Specialist Hospital f/u appt confirmed? Yes  Scheduled to see cardiology provider on 06/13/22 @ 10:20 Are transportation arrangements needed? No  If their condition worsens, is the pt aware to call PCP or go to the  Emergency Dept.? Yes Was the patient provided with contact information for the PCP's office or ED? Yes Was to pt encouraged to call back with questions or concerns? Yes  SDOH assessments and interventions completed:   Yes  Care Coordination Interventions Activated:  Yes Care Coordination Interventions:   reviewed new medication changes at hospital discharge; clarified medication changes with caregiver who verbalizes good understanding of same  Encounter Outcome:  Pt. Visit Completed  Oneta Rack, RN, BSN, [place] RN [place] Management [phone]: direct office

## 2022-05-24 NOTE — Patient Outreach (Signed)
[facility]) Care Management  05/24/2022  [name] 1946-06-12 [id number]  [place] Organization [ACO] Patient: Marissa [name] [facility]  Primary Care Provider:  [name], MD, [place] [facility], is an embedded provider with a Chronic Care Management team and program, and is listed for the transition of care follow up and appointments.  Patient was screened for readmission less than 30 days and assess for  Embedded practice service needs for care coordination.  Plan: A referral request Embedded Care Management for post hospital needs noted.  Please contact for further questions,  Natividad Brood, RN BSN [facility] Liaison  [phone] business mobile phone Toll free office [phone]  Fax number: [phone] [email or web address]

## 2022-05-27 ENCOUNTER — Telehealth: Payer: Self-pay | Admitting: *Deleted

## 2022-05-27 MED FILL — Medication: Qty: 2 | Status: AC

## 2022-05-27 NOTE — Chronic Care Management (AMB) (Signed)
  Care Coordination  Outreach Note  05/27/2022 Name: Marissa [name] MRN: [id number] DOB: 1946-04-26   Care Coordination Outreach Attempts  An unsuccessful telephone outreach was attempted today to offer the patient information about available care coordination services as a benefit of their health plan.   Follow Up Plan:  Additional outreach attempts will be made to offer the patient care coordination information and services.   Encounter Outcome:  No Answer  Julian Hy, [place] Direct Dial: [phone]

## 2022-05-29 ENCOUNTER — Ambulatory Visit: Payer: Medicare Other | Admitting: Physician Assistant

## 2022-05-30 ENCOUNTER — Encounter: Payer: Self-pay | Admitting: Internal Medicine

## 2022-05-30 ENCOUNTER — Ambulatory Visit (INDEPENDENT_AMBULATORY_CARE_PROVIDER_SITE_OTHER): Payer: Medicare Other | Admitting: Internal Medicine

## 2022-05-30 VITALS — BP 116/82 | HR 58 | Resp 18 | Ht 64.0 in | Wt 121.0 lb

## 2022-05-30 DIAGNOSIS — I469 Cardiac arrest, cause unspecified: Secondary | ICD-10-CM | POA: Diagnosis not present

## 2022-05-30 DIAGNOSIS — E876 Hypokalemia: Secondary | ICD-10-CM | POA: Diagnosis not present

## 2022-05-30 DIAGNOSIS — A0472 Enterocolitis due to Clostridium difficile, not specified as recurrent: Secondary | ICD-10-CM

## 2022-05-30 DIAGNOSIS — E43 Unspecified severe protein-calorie malnutrition: Secondary | ICD-10-CM

## 2022-05-30 LAB — COMPREHENSIVE METABOLIC PANEL
ALT: 20 U/L (ref 0–35)
AST: 27 U/L (ref 0–37)
Albumin: 3.7 g/dL (ref 3.5–5.2)
Alkaline Phosphatase: 70 U/L (ref 39–117)
BUN: 10 mg/dL (ref 6–23)
CO2: 27 mEq/L (ref 19–32)
Calcium: 8.5 mg/dL (ref 8.4–10.5)
Chloride: 101 mEq/L (ref 96–112)
Creatinine, Ser: 0.64 mg/dL (ref 0.40–1.20)
GFR: 85.22 mL/min (ref >60.00)
Glucose, Bld: 61 mg/dL — ABNORMAL LOW (ref 70–99)
Potassium: 3.5 mEq/L (ref 3.5–5.1)
Sodium: 138 mEq/L (ref 135–145)
Total Bilirubin: 0.5 mg/dL (ref 0.2–1.2)
Total Protein: 6.1 g/dL (ref 6.0–8.3)

## 2022-05-30 LAB — CBC WITH DIFFERENTIAL/PLATELET
Basophils Absolute: 0.1 K/uL (ref 0.0–0.1)
Basophils Relative: 0.9 % (ref 0.0–3.0)
Eosinophils Absolute: 0.3 K/uL (ref 0.0–0.7)
Eosinophils Relative: 4.9 % (ref 0.0–5.0)
HCT: 29.2 % — ABNORMAL LOW (ref 36.0–46.0)
Hemoglobin: 9.8 g/dL — ABNORMAL LOW (ref 12.0–15.0)
Lymphocytes Relative: 19.2 % (ref 12.0–46.0)
Lymphs Abs: 1.2 K/uL (ref 0.7–4.0)
MCHC: 33.6 g/dL (ref 30.0–36.0)
MCV: 91.4 fl (ref 78.0–100.0)
Monocytes Absolute: 0.6 K/uL (ref 0.1–1.0)
Monocytes Relative: 9.6 % (ref 3.0–12.0)
Neutro Abs: 4.3 K/uL (ref 1.4–7.7)
Neutrophils Relative %: 65.4 % (ref 43.0–77.0)
Platelets: 263 K/uL (ref 150.0–400.0)
RBC: 3.2 Mil/uL — ABNORMAL LOW (ref 3.87–5.11)
RDW: 14.6 % (ref 11.5–15.5)
WBC: 6.5 K/uL (ref 4.0–10.5)

## 2022-05-30 LAB — MAGNESIUM: Magnesium: 1.3 mg/dL — ABNORMAL LOW (ref 1.5–2.5)

## 2022-05-30 MED ORDER — LOSARTAN POTASSIUM 25 MG PO TABS: 25.0000 mg | ORAL_TABLET | Freq: Every day | ORAL | 1 refills | Status: DC

## 2022-05-30 MED ORDER — HYDROCODONE-ACETAMINOPHEN 5-325 MG PO TABS: 1.0000 | ORAL_TABLET | Freq: Four times a day (QID) | ORAL | 0 refills | Status: DC | PRN

## 2022-05-30 NOTE — Assessment & Plan Note (Signed)
Checking CMP and magnesium and adjust as needed.

## 2022-05-30 NOTE — Patient Instructions (Addendum)
Today should be the last day of the vancomycin so take it today and then stop.  For the itching this should go away in the next week or two. Take claritin or zyrtec 1 pill 3 times a day.   We have sent in the hydrocodone for pain to use for the chest.

## 2022-05-30 NOTE — Assessment & Plan Note (Signed)
Improving and recommended 10 day course starting 05/21/22 per ID inpatient. This is day 10 and she is advised to stop after all of today's doses. She is still having some loose stools but markedly improved. Checking CMP and magnesium as these were very low due to the C dif diarrhea.

## 2022-05-30 NOTE — Assessment & Plan Note (Signed)
Appetite is improving now that her C dif is under control.

## 2022-05-30 NOTE — Assessment & Plan Note (Signed)
She is having significant pain likely related to CPR. Given that she has prolonged QT tramadol is not appropriate ad rx hydrocodone/apap 5/325 #30 was prescribed to use for pain. Okay to use lidocaine patch which is helping most.

## 2022-05-30 NOTE — Progress Notes (Signed)
   Subjective:   Patient ID: Marissa [name], female    DOB: Mar 02, 1946, 76 y.o.   MRN: [id number]  HPI The patient is a 76 YO female coming in for follow up after hospital.   Review of Systems  Constitutional:  Positive for activity change and fatigue.  HENT: Negative.    Eyes: Negative.   Respiratory:  Negative for cough, chest tightness and shortness of breath.   Cardiovascular:  Positive for chest pain. Negative for palpitations and leg swelling.  Gastrointestinal:  Negative for abdominal distention, abdominal pain, constipation, diarrhea, nausea and vomiting.  Musculoskeletal:  Positive for myalgias.  Skin: Negative.   Neurological:  Positive for weakness.  Psychiatric/Behavioral: Negative.      Objective:  Physical Exam Constitutional:      Appearance: She is well-developed. She is ill-appearing.  HENT:     Head: Normocephalic and atraumatic.  Cardiovascular:     Rate and Rhythm: Normal rate and regular rhythm.  Pulmonary:     Effort: Pulmonary effort is normal. No respiratory distress.     Breath sounds: Normal breath sounds. No wheezing or rales.  Abdominal:     General: Bowel sounds are normal. There is no distension.     Palpations: Abdomen is soft.     Tenderness: There is no abdominal tenderness. There is no rebound.  Musculoskeletal:        General: Tenderness present.     Cervical back: Normal range of motion.  Skin:    General: Skin is warm and dry.  Neurological:     Mental Status: She is alert and oriented to person, place, and time.     Coordination: Coordination abnormal.     Comments: Walker but ambulating independently.      Vitals:   05/30/22 0755  BP: 116/82  Pulse: (!) 58  Resp: 18  SpO2: 98%  Weight: 121 lb (54.9 kg)  Height: 5' 4"  (1.626 m)    Assessment & Plan:  Visit time 25 minutes in face to face communication with patient and coordination of care, additional 10 minutes spent in record review, coordination or care, ordering tests,  communicating/referring to other healthcare professionals, documenting in medical records all on the same day of the visit for total time 35 minutes spent on the visit.

## 2022-05-30 NOTE — Assessment & Plan Note (Signed)
Checking magnesium and cmp today. Treat as appropriate.

## 2022-05-30 NOTE — Progress Notes (Signed)
[facility] Summary Report / Loop Recorder 

## 2022-06-05 NOTE — Chronic Care Management (AMB) (Signed)
  Care Coordination   Note   06/05/2022 Name: Marissa [name] MRN: [id number] DOB: 1946-06-12  Marissa [name] is a 76 y.o. year old female who sees [name], MD for primary care. I reached out to Marissa [name] by phone today to offer care coordination services.  Scheduled from referral   Marissa [name] was given information about Care Coordination services today including:   The Care Coordination services include support from the care team which includes your Nurse Coordinator, Clinical Social Worker, or Pharmacist.  The Care Coordination team is here to help remove barriers to the health concerns and goals most important to you. Care Coordination services are voluntary, and the patient may decline or stop services at any time by request to their care team member.   Care Coordination Consent Status: Patient agreed to services and verbal consent obtained.   Follow up plan:  Telephone appointment with care coordination team member scheduled for:  06/14/2022  Encounter Outcome:  Pt. Scheduled  Julian Hy, [place] Direct Dial: [phone]

## 2022-06-09 ENCOUNTER — Encounter: Payer: Self-pay | Admitting: Cardiology

## 2022-06-09 ENCOUNTER — Encounter: Payer: Self-pay | Admitting: Internal Medicine

## 2022-06-10 ENCOUNTER — Encounter: Payer: Self-pay | Admitting: Internal Medicine

## 2022-06-10 ENCOUNTER — Ambulatory Visit: Payer: Medicare Other | Admitting: Internal Medicine

## 2022-06-11 MED ORDER — PREDNISONE 20 MG PO TABS: 40.0000 mg | ORAL_TABLET | Freq: Every day | ORAL | 0 refills | Status: DC

## 2022-06-11 MED ORDER — TRIAMCINOLONE ACETONIDE 0.1 % EX CREA: 1.0000 | TOPICAL_CREAM | Freq: Two times a day (BID) | CUTANEOUS | 0 refills | Status: DC

## 2022-06-11 MED ORDER — HYDROXYZINE PAMOATE 25 MG PO CAPS: 25.0000 mg | ORAL_CAPSULE | Freq: Three times a day (TID) | ORAL | 0 refills | Status: DC | PRN

## 2022-06-11 NOTE — Addendum Note (Signed)
Addended by: Pricilla Holm A on: 06/11/2022 01:11 PM   Modules accepted: Orders

## 2022-06-13 ENCOUNTER — Encounter: Payer: Medicare Other | Admitting: Student

## 2022-06-14 ENCOUNTER — Other Ambulatory Visit: Payer: Self-pay

## 2022-06-14 NOTE — Patient Outreach (Signed)
  Care Coordination   06/14/2022 Name: Marissa [name] MRN: [id number] DOB: 08-23-1946   Care Coordination Outreach Attempts:  An unsuccessful telephone outreach was attempted today to offer the patient information about available care coordination services as a benefit of their health plan.   Follow Up Plan:  Additional outreach attempts will be made to offer the patient care coordination information and services.   Encounter Outcome:  No Answer  Care Coordination Interventions Activated:  No   Care Coordination Interventions:  No, not indicated    Thea Silversmith, RN, MSN, BSN, CCM Care Coordinator [phone]

## 2022-06-18 ENCOUNTER — Encounter: Payer: Medicare Other | Admitting: Psychology

## 2022-06-20 ENCOUNTER — Telehealth: Payer: Self-pay | Admitting: *Deleted

## 2022-06-20 NOTE — Chronic Care Management (AMB) (Signed)
  Care Coordination  Outreach Note  06/20/2022 Name: Marissa [name] MRN: [id number] DOB: 11-30-1945   Care Coordination Outreach Attempts  A second unsuccessful outreach was attempted today to offer the patient with information about available care coordination services as a benefit of their health plan.    Outreach to reschedule initial missed call with RNCM  Follow Up Plan:  Additional outreach attempts will be made to offer the patient care coordination information and services.   Encounter Outcome:  No Answer  [place]  Direct Dial: [phone]

## 2022-06-21 ENCOUNTER — Ambulatory Visit: Payer: Medicare Other

## 2022-06-21 ENCOUNTER — Encounter: Payer: Self-pay | Admitting: Internal Medicine

## 2022-06-21 NOTE — Patient Instructions (Signed)
Visit Information  Thank you for taking time to visit with me today. Please don't hesitate to contact me if I can be of assistance to you.   Following are the goals we discussed today:   Goals Addressed             This Visit's Progress    Recent Fall       Care Coordination Interventions: Provided written and verbal education re: potential causes of falls and Fall prevention strategies Advised caregiver of importance of notifying provider of falls and encouraged to notify provider Discussed [facility] memory care solutions and benefits Encouraged to continue to follow up with home health agency as scheduled and as needed [place] discussed-RNCM offered LCSW referral.  Encouraged to follow up with neurologist as scheduled Care Coordination/Update primary care provider          Our next appointment is by telephone on 07/04/22 at 11:30am  Please call the care guide team at [phone] if you need to cancel or reschedule your appointment.   If you are experiencing a Mental Health or [place] or need someone to talk to, please call the Suicide and Crisis Lifeline: 988  Patient verbalizes understanding of instructions and care plan provided today and agrees to view in [facility]. Active MyChart status and patient understanding of how to access instructions and care plan via MyChart confirmed with patient.     Thea Silversmith, RN, MSN, BSN, CCM Care Coordinator [phone]   Fall Prevention in the Home, Adult Falls can cause injuries and can happen to people of all ages. There are many things you can do to make your home safe and to help prevent falls. Ask for help when making these changes. What actions can I take to prevent falls? General Instructions Use good lighting in all rooms. Replace any light bulbs that burn out. Turn on the lights in dark areas. Use night-lights. Keep items that you use often in easy-to-reach places. Lower the shelves  around your home if needed. Set up your furniture so you have a clear path. Avoid moving your furniture around. Do not have throw rugs or other things on the floor that can make you trip. Avoid walking on wet floors. If any of your floors are uneven, fix them. Add color or contrast paint or tape to clearly mark and help you see: Grab bars or handrails. First and last steps of staircases. Where the edge of each step is. If you use a stepladder: Make sure that it is fully opened. Do not climb a closed stepladder. Make sure the sides of the stepladder are locked in place. Ask someone to hold the stepladder while you use it. Know where your pets are when moving through your home. What can I do in the bathroom?     Keep the floor dry. Clean up any water on the floor right away. Remove soap buildup in the tub or shower. Use nonskid mats or decals on the floor of the tub or shower. Attach bath mats securely with double-sided, nonslip rug tape. If you need to sit down in the shower, use a plastic, nonslip stool. Install grab bars by the toilet and in the tub and shower. Do not use towel bars as grab bars. What can I do in the bedroom? Make sure that you have a light by your bed that is easy to reach. Do not use any sheets or blankets for your bed that hang to the floor. Have a  firm chair with side arms that you can use for support when you get dressed. What can I do in the kitchen? Clean up any spills right away. If you need to reach something above you, use a step stool with a grab bar. Keep electrical cords out of the way. Do not use floor polish or wax that makes floors slippery. What can I do with my stairs? Do not leave any items on the stairs. Make sure that you have a light switch at the top and the bottom of the stairs. Make sure that there are handrails on both sides of the stairs. Fix handrails that are broken or loose. Install nonslip stair treads on all your stairs. Avoid  having throw rugs at the top or bottom of the stairs. Choose a carpet that does not hide the edge of the steps on the stairs. Check carpeting to make sure that it is firmly attached to the stairs. Fix carpet that is loose or worn. What can I do on the outside of my home? Use bright outdoor lighting. Fix the edges of walkways and driveways and fix any cracks. Remove anything that might make you trip as you walk through a door, such as a raised step or threshold. Trim any bushes or trees on paths to your home. Check to see if handrails are loose or broken and that both sides of all steps have handrails. Install guardrails along the edges of any raised decks and porches. Clear paths of anything that can make you trip, such as tools or rocks. Have leaves, snow, or ice cleared regularly. Use sand or salt on paths during winter. Clean up any spills in your garage right away. This includes grease or oil spills. What other actions can I take? Wear shoes that: Have a low heel. Do not wear high heels. Have rubber bottoms. Feel good on your feet and fit well. Are closed at the toe. Do not wear open-toe sandals. Use tools that help you move around if needed. These include: Canes. Walkers. Scooters. Crutches. Review your medicines with your doctor. Some medicines can make you feel dizzy. This can increase your chance of falling. Ask your doctor what else you can do to help prevent falls. Where to find more information Centers for Disease Control and Prevention, [employer]: http://www.wolf.info/ National Institute on Aging: http://kim-miller.com/ Contact a doctor if: You are afraid of falling at home. You feel weak, drowsy, or dizzy at home. You fall at home. Summary There are many simple things that you can do to make your home safe and to help prevent falls. Ways to make your home safe include removing things that can make you trip and installing grab bars in the bathroom. Ask for help when making these changes  in your home. This information is not intended to replace advice given to you by your health care provider. Make sure you discuss any questions you have with your health care provider. Document Revised: 07/16/2021 Document Reviewed: 05/17/2020 Elsevier Patient Education  [place].

## 2022-06-21 NOTE — Patient Outreach (Signed)
  Care Coordination   Initial Visit Note   06/21/2022 Name: Marissa [name] MRN: [id number] DOB: January 29, 1946  Marissa [name] is a 76 y.o. year old female who sees Marissa Koch, MD for primary care. I  spoke with Marissa [name], husband/DPR today  What matters to the patients health and wellness today?  RNCM received information from care guide that [name] reported a fall-provider not notified and patient taking anticoagulant. Per Marissa [name] report patient toppled over and hit her face. He reports she easily bruises. Marissa [name] states he will notify primary care provider. He also reports home health nurse visit and he is planning to get some personal care assistance to help out. Marissa [name] declines LCSW referral at this time adding he going to work with home health agency at this time.   Goals Addressed             This Visit's Progress    Recent Fall       Care Coordination Interventions: Provided written and verbal education re: potential causes of falls and Fall prevention strategies Advised caregiver of importance of notifying provider of falls and encouraged to notify provider Discussed [facility] memory care solutions and benefits Encouraged to continue to follow up with home health agency as scheduled and as needed Care Giver [place] discussed-RNCM offered LCSW referral.  Encouraged to follow up with neurologist as scheduled Care Coordination/Update primary care provider          SDOH assessments and interventions completed:  Yes    Care Coordination Interventions Activated:  Yes  Care Coordination Interventions:  Yes, provided   Follow up plan: Follow up call scheduled for 07/04/22    Encounter Outcome:  Pt. Visit Completed   Marissa Silversmith, RN, MSN, BSN, [place] Coordinator [phone]

## 2022-06-21 NOTE — Chronic Care Management (AMB) (Signed)
  Care Coordination   Note   06/21/2022 Name: Lakresha Stifter MRN: [id number] DOB: 08-29-1946  Oval Linsey Minner is a 76 y.o. year old female who sees [name], MD for primary care. I reached out to Oval Linsey Malia by phone today to offer care coordination services.    Follow up plan:  Telephone appointment with care coordination team member scheduled for:  06/21/22  Encounter Outcome:  Pt. Scheduled  [place]  Direct Dial: [phone]

## 2022-06-25 ENCOUNTER — Encounter: Payer: Medicare Other | Admitting: Psychology

## 2022-06-26 ENCOUNTER — Other Ambulatory Visit: Payer: Self-pay | Admitting: Cardiology

## 2022-06-26 DIAGNOSIS — I480 Paroxysmal atrial fibrillation: Secondary | ICD-10-CM

## 2022-06-26 NOTE — Telephone Encounter (Addendum)
Prescription refill request for Xarelto received.  Indication: CVA/AF Last office visit: 04/23/22  C Fenton PA Weight: 55.1kg Age: 76 Scr: 0.64 on 05/30/22 CrCl: 64.03  Pt is taking Xarelto 49m daily.  Based on above findings Xarelto 231mdaily is the appropriate dose.  CrCl was 50 in the past but SCr has improved.  Message sent to M Sundra AlandharmD to advise on dose adjustment.  09/16/22:  Called pt to discuss above.  Spoke with spouse.  He states pt is no longer taking Xarelto and Dr HaEllyn Hacks aware.  Discontinued from med list.

## 2022-06-27 ENCOUNTER — Encounter: Payer: Self-pay | Admitting: Internal Medicine

## 2022-06-27 ENCOUNTER — Ambulatory Visit: Payer: Medicare Other | Admitting: Internal Medicine

## 2022-06-27 DIAGNOSIS — F02B3 Dementia in other diseases classified elsewhere, moderate, with mood disturbance: Secondary | ICD-10-CM

## 2022-06-27 DIAGNOSIS — G309 Alzheimer's disease, unspecified: Secondary | ICD-10-CM

## 2022-06-27 DIAGNOSIS — M940 Chondrocostal junction syndrome [Tietze]: Secondary | ICD-10-CM | POA: Diagnosis not present

## 2022-06-27 DIAGNOSIS — I469 Cardiac arrest, cause unspecified: Secondary | ICD-10-CM

## 2022-06-27 DIAGNOSIS — Q825 Congenital non-neoplastic nevus: Secondary | ICD-10-CM | POA: Insufficient documentation

## 2022-06-27 MED ORDER — HYDROCODONE-ACETAMINOPHEN 5-325 MG PO TABS: 1.0000 | ORAL_TABLET | Freq: Four times a day (QID) | ORAL | 0 refills | Status: DC | PRN

## 2022-06-27 NOTE — Progress Notes (Signed)
   Subjective:   Patient ID: Marissa [name], female    DOB: 1946-09-10, 76 y.o.   MRN: [id number]  HPI The patient is a 76 YO female coming in for follow up.   Review of Systems  Constitutional: Negative.   HENT: Negative.    Eyes: Negative.   Respiratory:  Negative for cough, chest tightness and shortness of breath.   Cardiovascular:  Negative for chest pain, palpitations and leg swelling.  Gastrointestinal:  Negative for abdominal distention, abdominal pain, constipation, diarrhea, nausea and vomiting.  Musculoskeletal: Negative.   Skin: Negative.   Neurological: Negative.   Psychiatric/Behavioral: Negative.      Objective:  Physical Exam Constitutional:      Appearance: Normal appearance.  HENT:     Head: Normocephalic.  Cardiovascular:     Rate and Rhythm: Normal rate and regular rhythm.  Pulmonary:     Effort: Pulmonary effort is normal.  Musculoskeletal:        General: Tenderness present. Normal range of motion.  Skin:    General: Skin is warm and dry.  Neurological:     General: No focal deficit present.     Mental Status: She is alert. She is disoriented.     Vitals:   06/27/22 1043  BP: 120/80  Pulse: (!) 54  Temp: 98.2 F (36.8 C)  TempSrc: Oral  SpO2: 99%  Weight: 113 lb (51.3 kg)  Height: 5' 4"  (1.626 m)    Assessment & Plan:

## 2022-06-27 NOTE — Patient Instructions (Addendum)
Let us know if you if she needs therapy we can order that.  We have refilled hydrocodone for pain if needed.

## 2022-06-28 ENCOUNTER — Encounter: Payer: Self-pay | Admitting: Internal Medicine

## 2022-06-28 NOTE — Assessment & Plan Note (Signed)
She is still having moderate pain in the sternum and rib cage from CPR. Refill hydrocodone to use rarely.

## 2022-06-28 NOTE — Assessment & Plan Note (Signed)
Has had worsening since cardiac arrest and talked with patient and husband that we may see some improvement with time or we may not.

## 2022-06-28 NOTE — Assessment & Plan Note (Signed)
She is struggling with mental status changes which have been present since arrest with poor short term recall. She is showing some aggressive behavior towards husband he did not discuss in detail with her.

## 2022-07-02 ENCOUNTER — Ambulatory Visit: Payer: Medicare Other | Admitting: Physician Assistant

## 2022-07-02 ENCOUNTER — Encounter: Payer: Self-pay | Admitting: Physician Assistant

## 2022-07-02 VITALS — BP 186/76 | HR 80 | Ht 64.0 in | Wt 123.0 lb

## 2022-07-02 DIAGNOSIS — G309 Alzheimer's disease, unspecified: Secondary | ICD-10-CM | POA: Diagnosis not present

## 2022-07-02 DIAGNOSIS — F0280 Dementia in other diseases classified elsewhere without behavioral disturbance: Secondary | ICD-10-CM | POA: Diagnosis not present
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
## 2022-07-02 NOTE — Patient Instructions (Signed)
1 Follow  for physical therapy    2 follow up in 6 months    FALL PRECAUTIONS: Be cautious when walking. Scan the area for obstacles that may increase the risk of trips and falls. When getting up in the mornings, sit up at the edge of the bed for a few minutes before getting out of bed. Consider elevating the bed at the head end to avoid drop of blood pressure when getting up. Walk always in a well-lit room (use night lights in the walls). Avoid area rugs or power cords from appliances in the middle of the walkways. Use a walker or a cane if necessary and consider physical therapy for balance exercise. Get your eyesight checked regularly.  FINANCIAL OVERSIGHT: Supervision, especially oversight when making financial decisions or transactions is also recommended as difficulties arise.  HOME SAFETY: Consider the safety of the kitchen when operating appliances like stoves, microwave oven, and blender. Consider having supervision and share cooking responsibilities until no longer able to participate in those. Accidents with firearms and other hazards in the house should be identified and addressed as well.  DRIVING: Regarding driving, in patients with progressive memory problems, driving will be impaired. We advise to have someone else do the driving if trouble finding directions or if minor accidents are reported. Independent driving assessment is available to determine safety of driving.  ABILITY TO BE LEFT ALONE: If patient is unable to contact 911 operator, consider using LifeLine, or when the need is there, arrange for someone to stay with patients. Smoking is a fire hazard, consider supervision or cessation. Risk of wandering should be assessed by caregiver and if detected at any point, supervision and safe proof recommendations should be instituted.  MEDICATION SUPERVISION: Inability to self-administer medication needs to be constantly addressed. Implement a mechanism to ensure safe administration  of the medications.  RECOMMENDATIONS FOR ALL PATIENTS WITH MEMORY PROBLEMS: 1. Continue to exercise (Recommend 30 minutes of walking everyday, or 3 hours every week) 2. Increase social interactions - continue going to [place] and enjoy social gatherings with friends and family 3. Eat healthy, avoid fried foods and eat more fruits and vegetables 4. Maintain adequate blood pressure, blood sugar, and blood cholesterol level. Reducing the risk of stroke and cardiovascular disease also helps promoting better memory. 5. Avoid stressful situations. Live a simple life and avoid aggravations. Organize your time and prepare for the next day in anticipation. 6. Sleep well, avoid any interruptions of sleep and avoid any distractions in the bedroom that may interfere with adequate sleep quality 7. Avoid sugar, avoid sweets as there is a strong link between excessive sugar intake, diabetes, and cognitive impairment We discussed the Mediterranean diet, which has been shown to help patients reduce the risk of progressive memory disorders and reduces cardiovascular risk. This includes eating fish, eat fruits and green leafy vegetables, nuts like almonds and hazelnuts, walnuts, and also use olive oil. Avoid fast foods and fried foods as much as possible. Avoid sweets and sugar as sugar use has been linked to worsening of memory function.  There is always a concern of gradual progression of memory problems. If this is the case, then we may need to adjust level of care according to patient needs. Support, both to the patient and caregiver, should then be put into place.

## 2022-07-04 ENCOUNTER — Ambulatory Visit: Payer: Self-pay

## 2022-07-04 NOTE — Patient Outreach (Signed)
  Care Coordination   Follow Up Visit Note   07/04/2022 Name: Emalyn Schou MRN: [id number] DOB: 03/04/1946  Oval Linsey Hauth is a 76 y.o. year old female who sees [name], MD for primary care. I spoke with  Mr. Jehle (husband) by phone today.  What matters to the patients health and wellness today?  Mr. Ragsdale reports patient continues to improve. He reports she is stronger now and walking  better. Has followed up with primary care provider and neurologist. Blood pressure elevated at neurologist visit, Mr. Patlan reports patient took medications when she got home and blood pressure came down. Mr. Barcelo checks her blood pressure, but does not record(unable to states last reading). However, Mr. Occhipinti states patient is getting better. He reports she is performing activities that she likes to do.    Goals Addressed             This Visit's Progress    Health management       Care Coordination Interventions: Advised patient to routinely check and record blood pressure and notify provider if consistently >140/90.  Discussed medications. Encouraged to take as prescribed Reviewed upcoming appointments and encouraged to attend as scheduled Encouraged to maintain a routine and provide activities that patient enjoys doing.       COMPLETED: Recent Fall       Care Coordination Interventions: Advised caregiver to continue fall prevention strategies           SDOH assessments and interventions completed:  No    Care Coordination Interventions Activated:  Yes  Care Coordination Interventions:  Yes, provided   Follow up plan: Follow up call scheduled for 08/06/22    Encounter Outcome:  Pt. Visit Completed   Thea Silversmith, RN, MSN, BSN, [place] Coordinator [phone]

## 2022-07-04 NOTE — Patient Instructions (Signed)
Visit Information  Thank you for taking time to visit with me today. Please don't hesitate to contact me if I can be of assistance to you.   Following are the goals we discussed today:   Goals Addressed             This Visit's Progress    Health management       Care Coordination Interventions: Advised patient to routinely check and record blood pressure and notify provider if consistently >140/90.  Discussed medications. Encouraged to take as prescribed Reviewed upcoming appointments and encouraged to attend as scheduled Encouraged to maintain a routine and provide activities that patient enjoys doing.       COMPLETED: Recent Fall       Care Coordination Interventions: Advised caregiver to continue fall prevention strategies           Our next appointment is by telephone on 08/06/22 at 11:30 am  Please call the care guide team at [phone] if you need to cancel or reschedule your appointment.   If you are experiencing a Mental Health or [place] or need someone to talk to, please call the Suicide and Crisis Lifeline: 988  Patient verbalizes understanding of instructions and care plan provided today and agrees to view in [place]. Active MyChart status and patient understanding of how to access instructions and care plan via MyChart confirmed with patient.     Thea Silversmith, RN, MSN, BSN, CCM Care Coordinator [phone]

## 2022-07-08 DIAGNOSIS — H40013 Open angle with borderline findings, low risk, bilateral: Secondary | ICD-10-CM | POA: Diagnosis not present

## 2022-07-08 DIAGNOSIS — H35371 Puckering of macula, right eye: Secondary | ICD-10-CM | POA: Diagnosis not present

## 2022-07-08 DIAGNOSIS — H5340 Unspecified visual field defects: Secondary | ICD-10-CM | POA: Diagnosis not present

## 2022-07-08 DIAGNOSIS — H04123 Dry eye syndrome of bilateral lacrimal glands: Secondary | ICD-10-CM | POA: Diagnosis not present

## 2022-07-10 ENCOUNTER — Other Ambulatory Visit: Payer: Self-pay | Admitting: Ophthalmology

## 2022-07-10 DIAGNOSIS — H5340 Unspecified visual field defects: Secondary | ICD-10-CM

## 2022-07-12 ENCOUNTER — Encounter: Payer: Self-pay | Admitting: Internal Medicine

## 2022-07-15 MED ORDER — FLUOXETINE HCL 10 MG PO TABS: 10.0000 mg | ORAL_TABLET | Freq: Every day | ORAL | 0 refills | Status: DC

## 2022-07-19 ENCOUNTER — Ambulatory Visit: Payer: Medicare Other | Attending: Cardiology | Admitting: Cardiology

## 2022-07-19 ENCOUNTER — Encounter: Payer: Self-pay | Admitting: Cardiology

## 2022-07-19 VITALS — BP 188/78 | HR 62 | Ht 64.0 in | Wt 122.0 lb

## 2022-07-19 DIAGNOSIS — I6389 Other cerebral infarction: Secondary | ICD-10-CM

## 2022-07-19 DIAGNOSIS — I10 Essential (primary) hypertension: Secondary | ICD-10-CM

## 2022-07-19 DIAGNOSIS — I480 Paroxysmal atrial fibrillation: Secondary | ICD-10-CM | POA: Diagnosis not present

## 2022-07-19 NOTE — Patient Instructions (Signed)
Medication Instructions:  Your physician recommends that you continue on your current medications as directed. Please refer to the Current Medication list given to you today.  Labwork: None ordered.  Testing/Procedures: None ordered.  Follow-Up:  Your physician wants you to follow-up in: As needed with Dr. [name].    Implantable Loop Recorder Removal, Care After This sheet gives you information about how to care for yourself after your procedure. Your health care provider may also give you more specific instructions. If you have problems or questions, contact your health care provider. What can I expect after the procedure? After the procedure, it is common to have: Soreness or discomfort near the incision. Some swelling or bruising near the incision.  Follow these instructions at home: Incision care  Monitor your cardiac device site for redness, swelling, and drainage. Call the device clinic at [phone] if you experience these symptoms or fever/chills.  Keep the large square bandage on your site for 24 hours and then you may remove it yourself. Keep the steri-strips underneath in place.   You may shower after 72 hours / 3 days from your procedure with the steri-strips in place. They will usually fall off on their own, or may be removed after 10 days. Pat dry.   Avoid lotions, ointments, or perfumes over your incision until it is well-healed.  Please do not submerge in water until your site is completely healed.   If your wound site starts to bleed apply pressure.       If you have any questions/concerns please call the device clinic at [phone].  Activity  Return to your normal activities.  Contact a health care provider if: You have redness, swelling, or pain around your incision. You have a fever.

## 2022-07-19 NOTE — Progress Notes (Signed)
Electrophysiology Office Follow up Visit Note:    Date:  07/19/2022   ID:  Marissa [name], DOB 1946/04/15, MRN [id number]  PCP:  [name], MD  [facility] Cardiologist:  [name], MD  [facility] Electrophysiologist:  [name], MD    Interval History:    Marissa [name] is a 76 y.o. female who presents for a follow up. They were last seen by me in clinic 02/15/2022.  Most recently she saw Adline Peals, [place] on 04/23/2022 prior to dofetilide loading that same day. She reported significant weakness and fatigue while in Afib. She denied any missed doses of Xarelto in the prior 3 weeks. She also complained of pruritic rashes on her bilateral arms and hands which she attributed to her loop recorder. Her labs at that time showed creatinine at 1.02, K+ 3.6 and mag 1.3, CrCl calculated at 40 mL/min. She needed both K+ and magnesium replacement before initiating dofetilide. However, she developed VF arrest that required brief CPR in the setting of failed dofetilide loading. Her hospital course was also complicated by delirium superimposed on baseline dementia.  On 05/24/2022 her husband messaged the office reporting that she wanted her ILR removed. However, at the time she was suffering a C.diff infection and was in the hospital. Her ILR explant was scheduled after recovering from [place].  Today, she is accompanied by her husband. She has been struggling with delirium issues for longer than 8 weeks. They confirm she has been gradually improving.  Also she still complains of some chest pain.  Regarding her sleep quality, she may wake up at different times each night.  She denies any palpitations, shortness of breath, or peripheral edema. No lightheadedness, headaches, syncope, orthopnea, or PND.      Past Medical History:  Diagnosis Date   Arthritis    Asthma    Bicuspid aortic valve 05/2017   Likely functional bicuspid aortic valve with sclerosis and no stenosis.    Bursitis    Cataract    mild   Dysthymia    Hemorrhoids    Hx of ulcerative colitis    per dr Arnoldo Morale as per pt.   Hyperlipidemia    Labile hypertension    managed - labile.   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 Roxan Hockey)   Thyroid disease     Past Surgical History:  Procedure Laterality Date   ABDOMINAL HYSTERECTOMY     BREAST EXCISIONAL BIOPSY Left    BUNIONECTOMY     Cardiac Event Monitor  07/2017   Overall relatively normal.  Normal sinus rhythm with rare bradycardia and tachycardia.  Heart rate ranged from 55-110 bpm.  Occasional PACs and PVCs, every single 1 was felt.  No arrhythmias other than one short 4 beat run of PACs.   COLONOSCOPY  02/04/2005   all normal    FOOT SURGERY     3 pins in toes    HAND SURGERY     left thumb joint resection   LOOP RECORDER INSERTION N/A 08/09/2021   Procedure: LOOP RECORDER INSERTION;  Surgeon: [name], MD;  Location: [place] [facility];  Service: Cardiovascular;  Laterality: N/A;   nasal revision     NM MYOVIEW LTD  07/2017   LOW RISK study.  No ischemia or infarction.  EF greater than 65%.    SINUS IRRIGATION     TONSILLECTOMY AND ADENOIDECTOMY     TRANSTHORACIC ECHOCARDIOGRAM  06/'1, 8/'19   a) Moderate LVH. Normal EF 60-65%. Normal diastolic parameters. --> Difficult to fully visualize the aortic valve. Cannot exclude bicuspid valve. Mild aortic stenosis noted. No PFO. Mildly dilated left atrium. Trivial MR. No comment on mitral valve prolapse. Moderately dilated ascending aorta.;; b) F/u Echo To evaluate the aortic valve. -- Bicuspid AoV - mildly thickened / calcified. - No stenosis   TRANSTHORACIC ECHOCARDIOGRAM  08/08/2021   EF 60 to 65%.  Normal  diastolic parameters?.   ?  Functional bicuspid aortic valve with R&L cusp sommisure.  Very sclerotic.  Stable gradients ~mean gradient 10 mmHg. Very Mild stenosis. Aorta dilated to 42 mm.  Stable.  RAP 3 mmHg   TRANSTHORACIC ECHOCARDIOGRAM  08/15/2020   EF 60 to 65%.  No R WMA.  GRII DD.  Mild Aortic Stenosis (mean gradient 10 mmHg).  Moderate to severe aortic root dilation (45 mm) normal IVC.  (Stable compared to February 2020)     Current Medications: Current Meds  Medication Sig   acetaminophen (TYLENOL) 325 MG tablet Take 325 mg by mouth every 6 (six) hours as needed for moderate pain.   albuterol (VENTOLIN HFA) 108 (90 Base) MCG/ACT inhaler INHALE 2 PUFFS INTO THE LUNGS EVERY 6 HOURS AS NEEDED FOR WHEEZING OR SHORTNESS OF BREATH (Patient taking differently: Inhale 2 puffs into the lungs every 6 (six) hours as needed for shortness of breath or wheezing.)   cloNIDine (CATAPRES) 0.2 MG tablet Take 0.1 mg by mouth 2 (two) times daily as needed (systolic over 297). if blood pressure is great than 989 systolic for more than an hour   Eszopiclone 3 MG TABS Take 3 mg by mouth at bedtime as needed.   ezetimibe (ZETIA) 10 MG tablet Take 10 mg by mouth daily.   feeding supplement, GLUCERNA SHAKE, (GLUCERNA SHAKE) LIQD Take 237 mLs by mouth daily as needed (if not eating meals).   FLUoxetine (PROZAC) 10 MG tablet Take 1 tablet (10 mg total) by mouth daily.   HYDROcodone-acetaminophen (NORCO/VICODIN) 5-325 MG tablet Take 1 tablet by mouth every 6 (six) hours as needed for moderate pain.   hydrOXYzine (VISTARIL) 25 MG capsule Take 1 capsule (25 mg total) by mouth every 8 (eight) hours as needed.   levothyroxine (SYNTHROID) 125 MCG tablet TAKE 1 TABLET(125 MCG) BY MOUTH DAILY (Patient taking differently: Take 125 mcg by mouth daily.)   losartan (COZAAR) 25 MG tablet Take 1 tablet by mouth daily.   magnesium oxide (MAG-OX) 400 MG tablet Take 1 tablet (400 mg total) by mouth 2 (two) times daily.   melatonin  5 MG TABS Take 1 tablet (5 mg total) by mouth at bedtime.   metoprolol tartrate (LOPRESSOR) 25 MG tablet Take 25 mg tablet as needed if heart rate is above 100 after waiting for 10 minutes , if systolic blood pressure is less than 110 do not take metoprolol. (Patient taking differently: Take 25 mg by mouth See admin instructions. Take 1 tablet (25 mg) as needed if heart rate is above 100 after waiting for 10 minutes , if systolic blood pressure is less than 110 DO NOT take metoprolol.)   montelukast (SINGULAIR) 10 MG tablet TAKE 1 TABLET(10 MG) BY MOUTH AT  BEDTIME (Patient taking differently: Take 10 mg by mouth at bedtime.)   Multiple Vitamin (MULTIVITAMIN WITH MINERALS) TABS tablet Take 1 tablet by mouth daily.   nebivolol (BYSTOLIC) 10 MG tablet TAKE 1 TABLET(10 MG) BY MOUTH DAILY AT 8 PM (Patient taking differently: Take 10 mg by mouth at bedtime. TAKE 1 TABLET(10 MG) BY MOUTH DAILY AT 8 PM)   QUEtiapine (SEROQUEL) 25 MG tablet Take 1 tablet (25 mg total) by mouth at bedtime.   Respiratory Therapy Supplies (FLUTTER) DEVI Use as directed   Rivaroxaban (XARELTO) 15 MG TABS tablet Take 1 tablet (15 mg total) by mouth daily with supper. STOP ELIQUIS (Patient taking differently: Take 15 mg by mouth daily with supper.)   triamcinolone cream (KENALOG) 0.1 % Apply 1 Application topically 2 (two) times daily.     Allergies:   Amiodarone, Dofetilide, Fentanyl, Rosuvastatin, Amoxicillin, Hctz [hydrochlorothiazide], Hydralazine, Oxycodone, Codeine, Conjugated estrogens, Erythromycin, and Piroxicam   Social History   Socioeconomic History   Marital status: Married    Spouse name: Not on file   Number of children: 2   Years of education: Not on file   Highest education level: Not on file  Occupational History   Occupation: retired  Tobacco Use   Smoking status: Never   Smokeless tobacco: Never   Tobacco comments:    Never smoke 04/23/22  Vaping Use   Vaping Use: Never used  Substance and Sexual  Activity   Alcohol use: Yes    Alcohol/week: 7.0 standard drinks of alcohol    Types: 7 Shots of liquor per week    Comment: 1 drink daily club soda with burbon 08/31/2021   Drug use: No   Sexual activity: Not Currently  Other Topics Concern   Not on file  Social History Narrative   Right handed    Lives with husband    Social Determinants of Health   Financial Resource Strain: Low Risk  (02/22/2022)   Overall Financial Resource Strain (CARDIA)    Difficulty of Paying Living Expenses: Not hard at all  Food Insecurity: No Food Insecurity (05/24/2022)   Hunger Vital Sign    Worried About Running Out of Food in the Last Year: Never true    Ran Out of Food in the Last Year: Never true  Transportation Needs: No Transportation Needs (05/24/2022)   PRAPARE - Hydrologist (Medical): No    Lack of Transportation (Non-Medical): No  Physical Activity: Inactive (02/22/2022)   Exercise Vital Sign    Days of Exercise per Week: 0 days    Minutes of Exercise per Session: 0 min  Stress: No Stress Concern Present (02/22/2022)   [place]    Feeling of Stress : Not at all  Social Connections: Moderately Isolated (02/22/2022)   Social Connection and Isolation Panel [NHANES]    Frequency of Communication with Friends and Family: Twice a week    Frequency of Social Gatherings with Friends and Family: Twice a week    Attends Religious Services: Never    Marine scientist or Organizations: No    Attends Music therapist: Never    Marital Status: Married     Family History: The patient's family history includes Arthritis in her mother; Breast cancer in her maternal grandmother and paternal grandmother; Diabetes in her paternal aunt; Heart disease in her maternal grandfather and maternal uncle; Leukemia in her father. There is no history of Colon cancer.  ROS:   Please see the history of  present illness.    (+) Chest pain All other systems reviewed and are negative.  EKGs/Labs/Other Studies Reviewed:    The following studies were reviewed today:  04/29/2022  Echocardiogram: Sonographer Comments: Limited echo for LV EF.  IMPRESSIONS    1. Limited study to assess LV function; full doppler not performed.   2. Left ventricular ejection fraction, by estimation, is 60 to 65%. The  left ventricle has normal function. The left ventricle has no regional  wall motion abnormalities. There is mild left ventricular hypertrophy.   3. Right ventricular systolic function is moderately reduced. The right  ventricular size is mildly enlarged.   4. Left atrial size was mildly dilated.   5. Right atrial size was mildly dilated.   6. The mitral valve is normal in structure.   7. The aortic valve is bicuspid.   8. Aortic dilatation noted. There is mild dilatation of the ascending  aorta, measuring 43 mm.   9. The inferior vena cava is normal in size with greater than 50%  respiratory variability, suggesting right atrial pressure of 3 mmHg.    Recent Labs: 04/29/2022: TSH 0.477 05/30/2022: ALT 20; BUN 10; Creatinine, Ser 0.64; Hemoglobin 9.8; Magnesium 1.3; Platelets 263.0; Potassium 3.5; Sodium 138   Recent Lipid Panel    Component Value Date/Time   CHOL 250 (H) 08/07/2021 0404   CHOL 218 (H) 04/09/2021 1016   TRIG 150 (H) 08/07/2021 0404   HDL 111 08/07/2021 0404   HDL 79 04/09/2021 1016   CHOLHDL 2.3 08/07/2021 0404   VLDL 30 08/07/2021 0404   LDLCALC 109 (H) 08/07/2021 0404   LDLCALC 123 (H) 04/09/2021 1016   LDLDIRECT 146.0 11/21/2020 1443    Physical Exam:    VS:  BP (!) 188/78   Pulse 62   Ht 5' 4"  (1.626 m)   Wt 122 lb (55.3 kg)   SpO2 99%   BMI 20.94 kg/m     Wt Readings from Last 3 Encounters:  07/19/22 122 lb (55.3 kg)  07/02/22 123 lb (55.8 kg)  06/27/22 113 lb (51.3 kg)     GEN: Well nourished, well developed in no acute distress HEENT: Normal NECK:  No JVD; No carotid bruits LYMPHATICS: No lymphadenopathy CARDIAC: RRR, no murmurs, rubs, gallops. Device pocket well healed. RESPIRATORY:  Clear to auscultation without rales, wheezing or rhonchi  ABDOMEN: Soft, non-tender, non-distended MUSCULOSKELETAL:  No edema; No deformity  SKIN: Warm and dry NEUROLOGIC:  Alert and oriented. PSYCHIATRIC: anxious       ASSESSMENT:    1. Cerebrovascular accident (CVA) due to other mechanism ([place])   2. Essential hypertension    PLAN:    In order of problems listed above:  #CVA Patient has requested her loop recorder be removed. She thinks it may be contributing to her chronic pain. After discussing options today with the patient and her husband, we have decided to proceed with explant.  #pAF Failed dofetilide due to TdP.  On xarelto for stroke ppx.   #Labile HTN BP labile. I suspect some of this is due to her waxing/waning mental status/moods. Continue current therapy with close primary care follow up.  Follow up as needed with EP.  Total time spent with patient today 42 minutes. This includes reviewing records, evaluating the patient and coordinating care.   Medication Adjustments/Labs and Tests Ordered: Current medicines are reviewed at length with the patient today.  Concerns regarding medicines are outlined above.  No orders of the defined types were placed in this encounter.  No orders of the defined types were placed in this encounter.   I,Mathew Stumpf,acting as a Education administrator for [name], MD.,have documented all relevant documentation on the behalf of [name], MD,as directed by  [name], MD while in the presence of [name], MD.  I, [name], MD, have reviewed all documentation for this visit. The documentation on 07/19/22 for the exam, diagnosis, procedures, and orders are all accurate and complete.   Signed, Lars Mage, MD, [facility], [facility] 07/19/2022 9:15 PM     Electrophysiology [facility]     ---------------------------------------------  SURGEON:  Lars Mage, MD    PREPROCEDURE DIAGNOSIS:  Atrial fibrillation    POSTPROCEDURE DIAGNOSIS:  Atrial fibrillation      PROCEDURES:   1. Implantable loop recorder explantation    INTRODUCTION:  Marissa [name] is a 76 y.o. female with a history of stroke and AF who presents today for implantable loop explantation.  The patient previously had a Medtronic Reveal LINQ implanted.      DESCRIPTION OF PROCEDURE:  Informed written consent was obtained.  The patient required no sedation for the procedure today.   The patients left chest was therefore prepped and draped in the usual sterile fashion.  The skin overlying the ILR monitor was infiltrated with lidocaine for local analgesia.  A 0.5-cm incision was made over the site.  The previously implanted ILR was exposed and removed using a combination of sharp and blunt dissection.  Steri- Strips and a sterile dressing were then applied. EBL<70m.  There were no early apparent complications.     CONCLUSIONS:   1. Successful explantation of a Medtronic Reveal LINQ implantable loop recorder   2. No early apparent complications.        CLars Mage MD 07/19/2022 9:18 PM

## 2022-07-23 ENCOUNTER — Encounter: Payer: Self-pay | Admitting: Internal Medicine

## 2022-07-24 ENCOUNTER — Other Ambulatory Visit: Payer: Self-pay | Admitting: *Deleted

## 2022-07-24 DIAGNOSIS — I7121 Aneurysm of the ascending aorta, without rupture: Secondary | ICD-10-CM

## 2022-07-24 NOTE — Telephone Encounter (Signed)
Last refill 06/27/22  Please advise

## 2022-07-26 ENCOUNTER — Other Ambulatory Visit: Payer: Self-pay | Admitting: Internal Medicine

## 2022-07-29 MED ORDER — HYDROCODONE-ACETAMINOPHEN 5-325 MG PO TABS: 1.0000 | ORAL_TABLET | Freq: Four times a day (QID) | ORAL | 0 refills | Status: DC | PRN

## 2022-08-01 ENCOUNTER — Ambulatory Visit
Admission: RE | Admit: 2022-08-01 | Discharge: 2022-08-01 | Disposition: A | Discharge status: Home / Self Care | Payer: Medicare Other | Source: Ambulatory Visit | Attending: Ophthalmology | Admitting: Ophthalmology

## 2022-08-01 DIAGNOSIS — H5340 Unspecified visual field defects: Secondary | ICD-10-CM | POA: Diagnosis not present

## 2022-08-01 DIAGNOSIS — I6782 Cerebral ischemia: Secondary | ICD-10-CM | POA: Diagnosis not present

## 2022-08-01 DIAGNOSIS — I611 Nontraumatic intracerebral hemorrhage in hemisphere, cortical: Secondary | ICD-10-CM | POA: Diagnosis not present

## 2022-08-01 DIAGNOSIS — G319 Degenerative disease of nervous system, unspecified: Secondary | ICD-10-CM | POA: Diagnosis not present

## 2022-08-01 MED ORDER — GADOBENATE DIMEGLUMINE 529 MG/ML IV SOLN
10.0000 mL | Freq: Once | INTRAVENOUS | Status: AC | PRN
  Administered 2022-08-01: 10 mL via INTRAVENOUS

## 2022-08-06 ENCOUNTER — Encounter: Payer: Self-pay | Admitting: Internal Medicine

## 2022-08-06 ENCOUNTER — Ambulatory Visit: Payer: Self-pay

## 2022-08-06 NOTE — Patient Instructions (Signed)
Visit Information  Thank you for taking time to visit with me today. Please don't hesitate to contact me if I can be of assistance to you.   Following are the goals we discussed today:   Goals Addressed             This Visit's Progress    Health management       Care Coordination Interventions: Pharmacy referral for medication review with patient/husband LCSW referral regarding caregiver stress Encouraged to maintain a routine and provide activities that patient enjoys doing Advised Mr. Carberry to communicate with provider and notify regarding patient's medication adherence Active listening Discussed the importance of recording blood pressure. Encouraged to check and record blood pressure as recommended by provider, and notify provider if consistently outside of recommended range.        Our next appointment is by telephone on 09/09/22 at 11:30 am  Please call the care guide team at [phone] if you need to cancel or reschedule your appointment.   If you are experiencing a Mental Health or [place] or need someone to talk to, please call the Suicide and Crisis Lifeline: 988  Patient verbalizes understanding of instructions and care plan provided today and agrees to view in [place]. Active MyChart status and patient understanding of how to access instructions and care plan via MyChart confirmed with patient.     Thea Silversmith, RN, MSN, BSN, [place] Coordinator [phone]

## 2022-08-06 NOTE — Patient Outreach (Signed)
  Care Coordination   Follow Up Visit Note   08/06/2022 Name: Kasey Hansell MRN: [id number] DOB: February 14, 1946  Oval Linsey Kirchhoff is a 76 y.o. year old female who sees [name], MD for primary care. I spoke with Oval Linsey Stegenga and Edwin Eisemann(spouse/dpr) by phone today.  What matters to the patients health and wellness today?  Mr. Silberman reports some improvement in patient, but states patient still confused. Currently Mrs. Wernert is concerned that her medications are not correct. She has stopped taking Xarelto because of bruising and bleeding and feeling week. Per patient and Mr. Koslosky, she feels better after stopping Xarelto. Mrs. Laroche states she will not follow up with prescribing provider. Per review of chart-Mr. Scott has message/notified primary care provider and requesting recommendations.   Goals Addressed             This Visit's Progress    Health management       Care Coordination Interventions: Pharmacy referral for medication review with patient/husband LCSW referral regarding caregiver stress Encouraged to maintain a routine and provide activities that patient enjoys doing Advised Mr. Gamino to communicate with provider and notify regarding patient's medication adherence Active listening Discussed the importance of recording blood pressure. Encouraged to check and record blood pressure as recommended by provider, and notify provider if consistently outside of recommended range.        SDOH assessments and interventions completed:  No  Care Coordination Interventions Activated:  Yes  Care Coordination Interventions:  Yes, provided   Follow up plan: Follow up call scheduled for 09/09/22    Encounter Outcome:  Pt. Visit Completed   Thea Silversmith, RN, MSN, BSN, [place] Coordinator [phone]

## 2022-08-12 ENCOUNTER — Ambulatory Visit: Payer: Self-pay | Admitting: Licensed Clinical Social Worker

## 2022-08-12 NOTE — Patient Outreach (Signed)
  Care Coordination  Initial Visit Note   08/12/2022 Name: Marissa [name] MRN: [id number] DOB: January 12, 1946  Marissa [name] is a 76 y.o. year old female who sees [name], MD for primary care. I spoke with  Marissa [name] by phone today.  What matters to the patients health and wellness today?    Patient was accompanied by her husband Marissa [name] who provided information during this encounter. Patient and husband are experiencing symptoms of  stress  which seems to be exacerbated by her continued decline in health and memory.[name] Kitchen  She would like someone that she can talk to and would like to go outside of the home ( shopping and dinner)  Recommendation: Patient may benefit from, and husband is in agreement to Also connect for therapy to assist with managing caregiver stress.     Goals Addressed             This Visit's Progress    Care Coordination Activities       Care Coordination Interventions: Motivational Interviewing employed Solution-Focused Strategies employed:  Active listening / Reflection utilized  Problem [place] strategies reviewed Provided general psycho-education for mental health needs  Participation in counseling encouraged : See options below Discussed referral options to connect for ongoing therapy: for both patient and husband Caregiver stress acknowledged :  Discussed options with retirement community: [facility]  [address] [place], [place] [address] [phone]   [email or web address]   [facility], [place], [place] [address] Phone [phone] [email or web address]     [place]  [address] [place], [place] [address] Phone [phone] [email or web address]           SDOH assessments and interventions completed:  Yes  SDOH Interventions Today    Flowsheet Row Most Recent Value  SDOH  Interventions   Utilities Interventions Intervention Not Indicated  Alcohol Usage Interventions Alohol Education/Brief Advice       Care Coordination Interventions Activated:  Yes  Care Coordination Interventions:  Yes, provided   Follow up plan: Follow up call scheduled for 08/20/2022    Encounter Outcome:  Pt. Visit Completed   Casimer Lanius, [place] [phone]

## 2022-08-12 NOTE — Patient Instructions (Signed)
Visit Information  Thank you for taking time to visit with me today. Please don't hesitate to contact me if I can be of assistance to you.   Following are the goals we discussed today:   Goals Addressed             This Visit's Progress    Care Coordination Activities       Care Coordination Interventions: Motivational Interviewing employed Solution-Focused Strategies employed:  Active listening / Reflection utilized  Problem [place] strategies reviewed Provided general psycho-education for mental health needs  Participation in counseling encouraged : See options below Discussed referral options to connect for ongoing therapy: for both patient and husband Caregiver stress acknowledged :  Discussed options with retirement community: [facility]  [address] [place], [place] [address] [phone]   [email or web address]   [facility], [place], [place] [address] Phone [phone] [email or web address]     [place]  [address] [place], [place] [address] Phone [phone] [email or web address]           Our next appointment is by telephone on 10/24 at 1:45  Please call the care guide team at [phone] if you need to cancel or reschedule your appointment.    Patient verbalizes understanding of instructions and care plan provided today and agrees to view in [place]. Active MyChart status and patient understanding of how to access instructions and care plan via MyChart confirmed with patient.     Casimer Lanius, [place] [phone]

## 2022-08-20 ENCOUNTER — Ambulatory Visit: Payer: Self-pay | Admitting: Licensed Clinical Social Worker

## 2022-08-20 DIAGNOSIS — I77810 Thoracic aortic ectasia: Secondary | ICD-10-CM | POA: Diagnosis not present

## 2022-08-20 DIAGNOSIS — E785 Hyperlipidemia, unspecified: Secondary | ICD-10-CM | POA: Diagnosis not present

## 2022-08-20 DIAGNOSIS — I350 Nonrheumatic aortic (valve) stenosis: Secondary | ICD-10-CM | POA: Diagnosis not present

## 2022-08-20 DIAGNOSIS — I4819 Other persistent atrial fibrillation: Secondary | ICD-10-CM | POA: Diagnosis not present

## 2022-08-20 NOTE — Patient Outreach (Signed)
  Care Coordination   Follow Up Visit Note   08/20/2022 Name: Tikesha Mort MRN: [id number] DOB: 10-01-1946  Oval Linsey Palma is a 76 y.o. year old female who sees [name], MD for primary care. I spoke with  Oval Linsey Kovalcik by phone today.  What matters to the patients health and wellness today?  Connect for therapy  Patient was accompanied by husband Ed who provided information during this encounter. Patient is making progress with managing her stress, patient and husband have participated in a family outing  .   Recommendation: Patient may benefit from, and is in agreement to Allow LCSW to make referral for therapy.  Work on [employer] and plan another outing to get out of the house Explore addition home care support for caregiver/ husband     Goals Addressed             This Visit's Progress    COMPLETED: Care Coordination Activities No Follow up Required       Care Coordination Interventions: Motivational Interviewing employed Solution-Focused Strategies employed:  Active listening / Reflection utilized  Emotional Support Provided Problem [place] strategies reviewed Participation in counseling encouraged : See options below Discussed referral options to connect for ongoing therapy: for both patient and husband Caregiver stress acknowledged :with husband Faxed  referral to  [place] [facility] Discussed options for home care support( see list below)  [place]  [address] [place], [place] [address] [phone]   [email or web address]   [facility] all pricing based on the care needed.  [id number]    [place] [phone]            SDOH assessments and interventions completed:  No    Care Coordination Interventions Activated:  Yes  Care  Coordination Interventions:  Yes, provided   Follow up plan: No further intervention required. Patient and husband does not desire ongoing interventions, will call if needed    Encounter Outcome:  Pt. Visit Completed   Casimer Lanius, [place] [phone]

## 2022-08-20 NOTE — Patient Instructions (Signed)
Visit Information  Thank you for taking time to visit with me today. Please don't hesitate to contact me if I can be of assistance to you.   Following are the goals we discussed today:   Goals Addressed             This Visit's Progress    COMPLETED: Care Coordination Activities No Follow up Required       Care Coordination Interventions: Motivational Interviewing employed Solution-Focused Strategies employed:  Active listening / Reflection utilized  Emotional Support Provided Problem [place] strategies reviewed Participation in counseling encouraged : See options below Discussed referral options to connect for ongoing therapy: for both patient and husband Caregiver stress acknowledged :with husband Faxed  referral to  [place] [facility] Discussed options for home care support( see list below)  [place]  [address] [place], [place] [address] [phone]   [email or web address]   [facility] all pricing based on the care needed.  [phone]    [place]             Patient verbalizes understanding of instructions and care plan provided today and agrees to view in [place]. Active MyChart status and patient understanding of how to access instructions and care plan via MyChart confirmed with patient.     No further follow up required: You do not desire ongoing follow up  Casimer Lanius, [place] [phone]     Care Coordination team works in collaboration with your primary care doctor.  Please call [phone] if you would like to schedule a phone appointment   1.The Care Coordination services include support from the care team which includes a Nurse Coordinator, Clinical Social  Worker, or Pharmacist, to assist with navigating your physical and mental health needs. [name] Kitchen  2.The Care Coordination team is here to help remove barriers to health concerns and goals most important to you.  3.Care Coordination services are voluntary, you may decline or stop services at any time by request to the care team member

## 2022-08-21 LAB — COMPREHENSIVE METABOLIC PANEL
ALT: 14 IU/L (ref 0–32)
AST: 24 IU/L (ref 0–40)
Albumin/Globulin Ratio: 1.9 (ref 1.2–2.2)
Albumin: 4.6 g/dL (ref 3.8–4.8)
Alkaline Phosphatase: 76 IU/L (ref 44–121)
BUN/Creatinine Ratio: 23 (ref 12–28)
BUN: 17 mg/dL (ref 8–27)
Bilirubin Total: <0.2 mg/dL (ref 0.0–1.2)
CO2: 25 mmol/L (ref 20–29)
Calcium: 9.5 mg/dL (ref 8.7–10.3)
Chloride: 101 mmol/L (ref 96–106)
Creatinine, Ser: 0.74 mg/dL (ref 0.57–1.00)
Globulin, Total: 2.4 g/dL (ref 1.5–4.5)
Glucose: 84 mg/dL (ref 70–99)
Potassium: 5.1 mmol/L (ref 3.5–5.2)
Sodium: 141 mmol/L (ref 134–144)
Total Protein: 7 g/dL (ref 6.0–8.5)
eGFR: 83 mL/min/{1.73_m2} (ref >59)

## 2022-08-21 LAB — LIPID PANEL
Chol/HDL Ratio: 3.2 ratio (ref 0.0–4.4)
Cholesterol, Total: 215 mg/dL — ABNORMAL HIGH (ref 100–199)
HDL: 67 mg/dL (ref >39)
LDL Chol Calc (NIH): 107 mg/dL — ABNORMAL HIGH (ref 0–99)
Triglycerides: 239 mg/dL — ABNORMAL HIGH (ref 0–149)
VLDL Cholesterol Cal: 41 mg/dL — ABNORMAL HIGH (ref 5–40)

## 2022-08-21 LAB — CBC
Hematocrit: 32.1 % — ABNORMAL LOW (ref 34.0–46.6)
Hemoglobin: 10.7 g/dL — ABNORMAL LOW (ref 11.1–15.9)
MCH: 29.6 pg (ref 26.6–33.0)
MCHC: 33.3 g/dL (ref 31.5–35.7)
MCV: 89 fL (ref 79–97)
Platelets: 285 10*3/uL (ref 150–450)
RBC: 3.62 x10E6/uL — ABNORMAL LOW (ref 3.77–5.28)
RDW: 12.7 % (ref 11.7–15.4)
WBC: 5.4 10*3/uL (ref 3.4–10.8)

## 2022-08-21 LAB — TSH: TSH: 1.54 u[IU]/mL (ref 0.450–4.500)

## 2022-08-25 ENCOUNTER — Encounter: Payer: Self-pay | Admitting: Cardiology

## 2022-08-26 ENCOUNTER — Encounter: Payer: Self-pay | Admitting: Internal Medicine

## 2022-08-27 MED ORDER — FLUOXETINE HCL 20 MG PO TABS: 20.0000 mg | ORAL_TABLET | Freq: Every day | ORAL | 1 refills | Status: DC

## 2022-08-28 ENCOUNTER — Ambulatory Visit (HOSPITAL_COMMUNITY)
Admission: RE | Admit: 2022-08-28 | Discharge: 2022-08-28 | Disposition: A | Discharge status: Home / Self Care | Payer: Medicare Other | Source: Ambulatory Visit | Attending: Cardiology | Admitting: Cardiology

## 2022-08-28 DIAGNOSIS — I350 Nonrheumatic aortic (valve) stenosis: Secondary | ICD-10-CM | POA: Diagnosis not present

## 2022-08-28 DIAGNOSIS — I77810 Thoracic aortic ectasia: Secondary | ICD-10-CM | POA: Insufficient documentation

## 2022-08-28 DIAGNOSIS — I712 Thoracic aortic aneurysm, without rupture, unspecified: Secondary | ICD-10-CM | POA: Diagnosis not present

## 2022-08-28 DIAGNOSIS — I517 Cardiomegaly: Secondary | ICD-10-CM | POA: Diagnosis not present

## 2022-08-28 DIAGNOSIS — D6869 Other thrombophilia: Secondary | ICD-10-CM | POA: Insufficient documentation

## 2022-08-28 DIAGNOSIS — E785 Hyperlipidemia, unspecified: Secondary | ICD-10-CM | POA: Insufficient documentation

## 2022-08-28 DIAGNOSIS — R0989 Other specified symptoms and signs involving the circulatory and respiratory systems: Secondary | ICD-10-CM | POA: Insufficient documentation

## 2022-08-28 DIAGNOSIS — I4819 Other persistent atrial fibrillation: Secondary | ICD-10-CM | POA: Insufficient documentation

## 2022-08-28 MED ORDER — GADOBUTROL 1 MMOL/ML IV SOLN
5.0000 mL | Freq: Once | INTRAVENOUS | Status: AC | PRN
  Administered 2022-08-28: 5 mL via INTRAVENOUS

## 2022-09-02 ENCOUNTER — Encounter: Payer: Self-pay | Admitting: Cardiology

## 2022-09-02 ENCOUNTER — Ambulatory Visit: Payer: Medicare Other | Attending: Cardiology | Admitting: Cardiology

## 2022-09-02 VITALS — BP 158/73 | HR 63 | Ht 64.0 in | Wt 128.6 lb

## 2022-09-02 DIAGNOSIS — R0989 Other specified symptoms and signs involving the circulatory and respiratory systems: Secondary | ICD-10-CM | POA: Diagnosis not present

## 2022-09-02 DIAGNOSIS — I350 Nonrheumatic aortic (valve) stenosis: Secondary | ICD-10-CM

## 2022-09-02 DIAGNOSIS — I4819 Other persistent atrial fibrillation: Secondary | ICD-10-CM | POA: Diagnosis not present

## 2022-09-02 DIAGNOSIS — I77810 Thoracic aortic ectasia: Secondary | ICD-10-CM

## 2022-09-02 DIAGNOSIS — D6869 Other thrombophilia: Secondary | ICD-10-CM

## 2022-09-02 DIAGNOSIS — E43 Unspecified severe protein-calorie malnutrition: Secondary | ICD-10-CM

## 2022-09-02 DIAGNOSIS — R911 Solitary pulmonary nodule: Secondary | ICD-10-CM

## 2022-09-02 MED ORDER — LOSARTAN POTASSIUM 25 MG PO TABS: 50.0000 mg | ORAL_TABLET | Freq: Every day | ORAL | 23 refills | Status: DC

## 2022-09-02 NOTE — Patient Instructions (Addendum)
Medication Instructions:    Start taking Cozaar ( Losartan )  50 mg daily, if you find that you dizzy or swimmy head taking theis dose , then separateit take 25 mg twice a day    *If you need a refill on your cardiac medications before your next appointment, please call your pharmacy*   Lab Work: Not needed If you have labs (blood work) drawn today and your tests are completely normal, you will receive your results only by: [place] (if you have MyChart) OR A paper copy in the mail If you have any lab test that is abnormal or we need to change your treatment, we will call you to review the results.   Testing/Procedures:  Not needed  Follow-Up: At [facility], you and your health needs are our priority.  As part of our continuing mission to provide you with exceptional heart care, we have created designated Provider Care Teams.  These Care Teams include your primary Cardiologist (physician) and Advanced Practice Providers (APPs -  Physician Assistants and Nurse Practitioners) who all work together to provide you with the care you need, when you need it.     Your next appointment:   6 month(s)  The format for your next appointment:   In Person  Provider:   [name], MD    Other Instructions   Discuss with Dr Roxan Hockey about CT - Pulm nodule - to get his take on the result of CT

## 2022-09-02 NOTE — Progress Notes (Signed)
Primary Care Provider: Hoyt [name], [place] Cardiologist: Marissa Hew, MD Electrophysiologist: Marissa Epley, MD  Clinic Note: Chief Complaint  Patient presents with   Follow-up    ===================================  ASSESSMENT/PLAN   Problem List Items Addressed This Visit       Cardiology Problems   Persistent atrial fibrillation [facility]): CHA2DS2-VASc Score = 7  (Chronic)    Interestingly, she seems to be in regular rhythm today by exam.  She does not know whether she has or is not in A-fib.  No longer has ILR in place. With CHA2DS2-VASc or 7, remains on Xarelto although she is not currently taking it. I did reiterate the benefit of taking Xarelto, but if she wants to hold for some of the bruising to clear up, this would be acceptable.  They do understand the risk from a stroke perspective of holding Xarelto.  Continue rate control with beta-blocker.  No more AAD optioins On low-dose Xarelto for stroke prophylaxis      Labile hypertension (Chronic)    It seems like her average blood pressures are pretty high at this point.  We can probably increase her underlying ARB dose.  We will increase to losartan 50 mg along with Bystolic.  Continue using  Clonidine 0.1 mg as needed for sustained SBP >160 mmHg.      Mild aortic stenosis (Chronic)    No notable change on current echo.  Can give her some time to recheck at least 1 to 2 years.      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  4.3 cm.        Other   Severe protein-calorie malnutrition ([place])    Significant weight loss.  Now seems to stabilize her pressures.  Continue encourage adequate hydration and nutrition.      Secondary hypercoagulable state ([place]) (Chronic)    Now essentially can be permanent A-fib when she goes into heart.  CHA2DS2-VASc score is 7.  She has significant bruising and would like to not be on anything.  At  this point while I understand it is important for her to heal up some or bruising, I do think that long-term treatment with Xarelto is warranted.  If she wants to hold it then this is against my recommendations that understand the risks of stroke.      Solitary pulmonary nodule (Chronic)    None nodule seen again on chest MRI.  I have forwarded this to Marissa [name] would probably recommend pulmonary consultation, but she is also due to see Marissa [name] next week.  I would ask her to also run it by him and I will have to check on the MRI result.  We will forward him this note.      ===================================  HPI:    Marissa [name] is a 76 y.o. female with a PMH below who presents today for 6-week follow-up and to discuss blood pressure/lab results..  Pertinent PMH: PAF-relatively new diagnosis -> diagnosed by ILR placed for CVA in October 2022 Initially stated on AAD 2/2 fatigue & dizziness while in Afib -- not aware of irregular Heartbeats. Intolerant of Amiodarone & Tikosyn ([place] During Tikosyn Load). CHADSVasc~7 - Xarelto 15 mg.  CVA October 2022 Labile HTN-on standing dose of losartan and Bystolic with PRN clonidine (now using 1/2 tablet as needed SBP> 160 (10) Mild AS with mild thoracic aortic dilation HLD with  intolerance to medications.  No longer on either Nexletol or ezetimibe.  Has not been able to tolerate statins either. Multiple medication intolerances including statins, Nexletol, ezetimibe, amiodarone and now Tikosyn Severe balance issues Dementia. Hypothyroidism.  Marissa [name] was last seen on July 19, 2021 by Marissa Presume, PA.  I last saw her on May 17. ->  Marissa [name] did not feel that she was a good candidate for ablation and suggested that the only remaining options for AAD would be Tikosyn or sotalol.  Plan was to consider hospitalization for Tikosyn load.  Otherwise the recommendation was rate control.  Tolerating Xarelto.  Was  also having lots of MSK related pain with the ILR that it was placed.  Recent Hospitalizations:  Hospitalization 6/27 - 7/6 for persistent A-fib, failed Tikosyn load with V-Tach (Torsades) arrest - brief CPR 0 no shock.  Also complicated by Delirium & urinary retension.  ; Echo - preserved LVEF;  Discharged to rehab after hospital stay [name]) PCP @ f/u 7/18: noted dark diarrhea; weak & poor appetite. 10% wgt loss - plan to increase nutrition supplementation. Urinating normally.  HypoKalemia 2.5 & low Mag-- Pt not able to tolerate PO or IV Potassium Supplementation Dx'd C-Diff => Rx for PO Vancomycin ordered.  Admitted 05/21/22 - Hypokalemia; complaints of diarrhea, poor oral intake, diagnosed with C. difficile on 7/19, treated for severe hypokalemia and hypomagnesemia as outpatient but could not tolerate and hence was advised to come to the ED .  Non-AG Acidosis.  => IV fluid hydration and supplementation of magnesium and potassium.  Correction of acidosis.  Repeat C. difficile testing on 7/25 was negative, and was starting to have formed stools.  Plan was 7 days of antibiotics from 7/25 and increased probiotic yogurt.  Hemoglobin levels seem to be stable.  Evidence of iron deficiency.  Consider outpatient iron supplementation.  Remain confused but not agitated. According to Marissa [name] the hospital course.  She, she was not taking care of at all.  She complained that no one to check in on her..  Seen on 05/30/2022 by Marissa [name] was complaining of fatigue, chest pain and myalgias and weakness.  Follow-up labs ordered.  Was on her last day of vancomycin.  Ordered hydrocodone for chest wall pain; they requested ILR removal. Neurology is recommending CBD versus [place], but patient declined oral.  Seen by Marissa [name] on 07/19/2022 -> ILR explanted.  Labile blood pressure thought to be potentially related to waxing waning mental status.  Reviewed  CV studies:    The following studies were reviewed today:  (if available, images/films reviewed: From Epic Chart or Care Everywhere) Echo 04/2022: Limited study.  EF 60 to 65%.  Normal LV function.  No RWMA.  Mild LVH.  Moderately reduced RV function.  Moderate RV dilation.  Mild bi-atrial enlargement-normal RAP.[name] Kitchen  Functionally bicuspid aortic valve (velocity is not measured).  Ascending aorta measured 43 mm.  MRI Angio Chest 08/28/2022: Stable Asc Ao dilation 4.3 cm.   Noncardiac: Irregular ground-glass lesion again noted in the superior right upper lobe which demonstrates probable enlargement since the prior chest CT in November, although exact measurements by MRI may be difficult compared to CT. Maximal transverse dimensions are approximately 2.1 x 2.5 cm. Findings remain suspicious for potential adenocarcinoma. Recommend follow-up CT and referral to Pulmonology.  MRI Brain 08/02/2022: 1. No evidence of acute intracranial abnormality. 2. Known small chronic cortical/subcortical infarct within the right parietal lobe (at the junction of the right MCA and  PCA vascular territories). 3. Redemonstrated small chronic cortical infarct within the left occipital lobe (PCA vascular territory). 4. Background moderate chronic small vessel ischemic changes within the cerebral white matter, progressed from the prior MRI of 08/06/2021.  5. Chronic microhemorrhages within the right frontal and parietal lobes, increased in number from the prior MRI.  6. Mild-to-moderate generalized cerebral atrophy. Mild cerebellar atrophy.  Interval History:   Marissa [name] presents here today along with her husband Ed, who helps out a lot with giving the history straight.  Marissa [name] has multiple complaints-mostly perseverating on how much pain she was having in the chest from the loop recorder, and how awful her hospital stay was when she went in with C. difficile.  She had multiple complaints and multiple rounds of emotional outbursts about these episodes. Currently it seems like the chest  pain is better.  However now she is noted no bruising on her arm and is refusing to take Xarelto. When asked about palpitations she does note off-and-on fluttering there is no way of knowing if she has or is not in A-fib.  Her husband is very worried about her blood pressures quite labile-this morning they are 180/80 and he took half of clonidine this morning.  Currently 158/73 which is actually pretty good for her.  I did not recheck her blood pressure because of her status and emotional nature.  She is not having any PND orthopnea no real edema.  A good portion of this clinical visit was simply allowing her to vent her frustrations after me to try to explain to her and her husband's increase of meds and what happened leading up to her hospitalizations etc.  I do not think it all of these issues all stemmed from her VT episode while in the hospital.  There was a brief episode with a short spell of chest compressions when no adverse cardiac event.  I think she may have had some significant delirium as a result of ICU stay.  Unfortunately, she somehow developed C. difficile with the profound diarrhea astutely diagnosed by her outpatient PCP.  Unfortunately she did not tolerate outpatient oral antibiotics because of her inability to maintain her supplementation of electrolytes.  Her major complaints are about the hospitalization that occurred as a result of her C. difficile related dehydration and electrolyte abnormality/acidosis.  CV Review of Symptoms (Summary): Cardiovascular ROS: positive for - irregular heartbeat, palpitations, and notably improved chest wall pain.  Flutters are noted but nothing lasting anything more than a few seconds.  Still has generalized weakness and fatigue.  Thankfully, GI issues have resolved. Labile blood pressures-but that is not new.. negative for - dyspnea on exertion, edema, loss of consciousness, orthopnea, palpitations, paroxysmal nocturnal dyspnea, rapid heart rate,  shortness of breath, or syncope or near syncope, TIA or amaurosis fugax.  Claudication.  REVIEWED OF SYSTEMS   Review of Systems  Constitutional:  Positive for malaise/fatigue. Negative for weight loss (Weight seems to have stabilized out.).  Respiratory:  Negative for cough and shortness of breath.   Cardiovascular:        Per HPI  Gastrointestinal:  Positive for abdominal pain. Negative for blood in stool, diarrhea and melena.       Appetite seems to be doing better.  Weights have stabilized.  Genitourinary:  Negative for hematuria.  Musculoskeletal:  Positive for back pain, falls, joint pain and myalgias.  Neurological:  Positive for dizziness, tingling, tremors, weakness (Profound generalized weakness) and headaches. Negative for loss  of consciousness.  Psychiatric/Behavioral:  Positive for depression and memory loss. The patient is nervous/anxious and has insomnia.     I have reviewed and (if needed) personally updated the patient's problem list, medications, allergies, past medical and surgical history, social and family history.   PAST MEDICAL HISTORY   Past Medical History:  Diagnosis Date   Arthritis    Asthma    Bicuspid aortic valve 05/2017   Likely functional bicuspid aortic valve with sclerosis and no stenosis.   Bursitis    Cataract    mild   Dysthymia    Hemorrhoids    Hx of ulcerative colitis    per dr Arnoldo Morale as per pt.   Hyperlipidemia    Labile hypertension    managed - labile.   Migraine    Scoliosis    Slurred speech    temporal lobe area that is not a tumor causes occ slurred speech and inability to communicate/ words will not come out at the correct time   Spinal stenosis    Stroke ([place]) 08/06/2021   Thoracic aortic aneurysm (HCC)    Stable 4.2-4.3 cm (followed by Marissa [name])   Thyroid disease     PAST SURGICAL HISTORY   Past Surgical History:  Procedure Laterality Date   ABDOMINAL HYSTERECTOMY     BREAST EXCISIONAL BIOPSY Left     BUNIONECTOMY     Cardiac Event Monitor  07/2017   Overall relatively normal.  Normal sinus rhythm with rare bradycardia and tachycardia.  Heart rate ranged from 55-110 bpm.  Occasional PACs and PVCs, every single 1 was felt.  No arrhythmias other than one short 4 beat run of PACs.   COLONOSCOPY  02/04/2005   all normal    FOOT SURGERY     3 pins in toes    HAND SURGERY     left thumb joint resection   LOOP RECORDER INSERTION N/A 08/09/2021   Procedure: LOOP RECORDER INSERTION;  Surgeon: Marissa Epley, MD;  Location: [facility];  Service: Cardiovascular;  Laterality: N/A;   nasal revision     NM MYOVIEW LTD  07/2017   LOW RISK study.  No ischemia or infarction.  EF greater than 65%.    SINUS IRRIGATION     TONSILLECTOMY AND ADENOIDECTOMY     TRANSTHORACIC ECHOCARDIOGRAM  06/'1, 8/'19   a) Moderate LVH. Normal EF 60-65%. Normal diastolic parameters. --> Difficult to fully visualize the aortic valve. Cannot exclude bicuspid valve. Mild aortic stenosis noted. No PFO. Mildly dilated left atrium. Trivial MR. No comment on mitral valve prolapse. Moderately dilated ascending aorta.;; b) F/u Echo To evaluate the aortic valve. -- Bicuspid AoV - mildly thickened / calcified. - No stenosis   TRANSTHORACIC ECHOCARDIOGRAM  08/08/2021   EF 60 to 65%.  Normal diastolic parameters?.   ?  Functional bicuspid aortic valve with R&L cusp sommisure.  Very sclerotic.  Stable gradients ~mean gradient 10 mmHg. Very Mild stenosis. Aorta dilated to 42 mm.  Stable.  RAP 3 mmHg   TRANSTHORACIC ECHOCARDIOGRAM  08/15/2020   EF 60 to 65%.  No R WMA.  GRII DD.  Mild Aortic Stenosis (mean gradient 10 mmHg).  Moderate to severe aortic root dilation (45 mm) normal IVC.  (Stable compared to February 2020)     Immunization History  Administered Date(s) Administered   Fluad Quad(high Dose 65+) 07/14/2020, 08/14/2021   Hep A / Hep B 02/24/2018, 06/25/2018   Hepatitis B, adult 03/26/2018   Influenza Split  09/12/2011, 08/19/2012   Influenza Whole 08/20/2007, 07/29/2008, 08/07/2009, 07/25/2010   Influenza, High Dose Seasonal PF 07/09/2016, 07/14/2017, 07/24/2018   Influenza,inj,Quad PF,6+ Mos 07/19/2013, 08/16/2014, 07/13/2015   Influenza-Unspecified 07/15/2019   PFIZER(Purple Top)SARS-COV-2 Vaccination 11/17/2019, 12/08/2019   Pneumococcal Conjugate-13 01/06/2015   Pneumococcal Polysaccharide-23 07/19/2013   Tdap 07/29/2011   Zoster, Live 10/28/2012    MEDICATIONS/ALLERGIES   Current Meds  Medication Sig   acetaminophen (TYLENOL) 325 MG tablet Take 325 mg by mouth every 6 (six) hours as needed for moderate pain.   albuterol (VENTOLIN HFA) 108 (90 Base) MCG/ACT inhaler INHALE 2 PUFFS INTO THE LUNGS EVERY 6 HOURS AS NEEDED FOR WHEEZING OR SHORTNESS OF BREATH (Patient taking differently: Inhale 2 puffs into the lungs every 6 (six) hours as needed for shortness of breath or wheezing.)   ALPRAZolam (XANAX) 0.5 MG tablet Take 0.5 mg by mouth 2 (two) times daily as needed.   cloNIDine (CATAPRES) 0.2 MG tablet Take 0.1 mg by mouth 2 (two) times daily as needed (systolic over 110). if blood pressure is great than 315 systolic for more than an hour   Eszopiclone 3 MG TABS Take 3 mg by mouth at bedtime as needed.   feeding supplement, GLUCERNA SHAKE, (GLUCERNA SHAKE) LIQD Take 237 mLs by mouth daily as needed (if not eating meals).   FLUoxetine (PROZAC) 20 MG tablet Take 1 tablet (20 mg total) by mouth daily. (Patient taking differently: Take 10 mg by mouth daily.)   HYDROcodone-acetaminophen (NORCO/VICODIN) 5-325 MG tablet Take 1 tablet by mouth every 6 (six) hours as needed for moderate pain.   latanoprost (XALATAN) 0.005 % ophthalmic solution 1 drop at bedtime.   levothyroxine (SYNTHROID) 125 MCG tablet TAKE 1 TABLET(125 MCG) BY MOUTH DAILY (Patient taking differently: Take 125 mcg by mouth daily.)   losartan (COZAAR) 25 MG tablet Take 1 tablet by mouth daily.   magnesium oxide (MAG-OX) 400 MG  tablet Take 1 tablet (400 mg total) by mouth 2 (two) times daily.   Multiple Vitamin (MULTIVITAMIN WITH MINERALS) TABS tablet Take 1 tablet by mouth daily.   nebivolol (BYSTOLIC) 10 MG tablet TAKE 1 TABLET(10 MG) BY MOUTH DAILY AT 8 PM (Patient taking differently: Take 10 mg by mouth at bedtime. TAKE 1 TABLET(10 MG) BY MOUTH DAILY AT 8 PM)   QUEtiapine (SEROQUEL) 25 MG tablet Take 1 tablet (25 mg total) by mouth at bedtime.   Respiratory Therapy Supplies (FLUTTER) DEVI Use as directed   triamcinolone cream (KENALOG) 0.1 % Apply 1 Application topically 2 (two) times daily.   [DISCONTINUED] melatonin 5 MG TABS Take 1 tablet (5 mg total) by mouth at bedtime.    [DISCO exam immunized to NTINUED] montelukast (SINGULAIR) 10 MG tablet TAKE 1 TABLET(10 MG) BY MOUTH AT BEDTIME (Patient taking differently: Take 10 mg by mouth at bedtime.)    Allergies  Allergen Reactions   Amiodarone Other (See Comments)    Nausea, ataxia, weakness and chills.  Chest pain, dyspnea; near syncope   Dofetilide Other (See Comments)    Torsades and cardiac arrest during dofetilide initiation   Fentanyl Shortness Of Breath and Rash   Rosuvastatin Anaphylaxis, Anxiety, Hives, Itching, Nausea And Vomiting, Other (See Comments), Palpitations, Photosensitivity and Shortness Of Breath   Amoxicillin Diarrhea    Severe diarrhea, rash to vaginal area with swelling    Hctz [Hydrochlorothiazide] Other (See Comments)    HypoNatremia   Hydralazine Other (See Comments)    Weak, sweats, red skin   Oxycodone Other (See Comments)  Codeine Other (See Comments)    makes her hyper   Conjugated Estrogens Itching and Rash   Erythromycin Rash    had a rash with emycin, has done ok with other meds in it's class   Piroxicam Itching and Rash    Feldene    SOCIAL HISTORY/FAMILY HISTORY   Reviewed in Epic:  Pertinent findings:  Social History   Tobacco Use   Smoking status: Never   Smokeless tobacco: Never   Tobacco comments:     Never smoke 04/23/22  Vaping Use   Vaping Use: Never used  Substance Use Topics   Alcohol use: Yes    Alcohol/week: 7.0 standard drinks of alcohol    Types: 7 Shots of liquor per week    Comment: 1 drink daily club soda with burbon 08/31/2021   Drug use: No   Social History   Social History Narrative   Right handed    Lives with husband     OBJCTIVE -PE, EKG, labs   Wt Readings from Last 3 Encounters:  09/02/22 128 lb 9.6 oz (58.3 kg)  07/19/22 122 lb (55.3 kg)  07/02/22 123 lb (55.8 kg)    Physical Exam: BP (!) 158/73 (BP Location: Left Arm, Patient Position: Sitting)   Pulse 63   Ht 5' 4"  (1.626 m)   Wt 128 lb 9.6 oz (58.3 kg)   SpO2 99%   BMI 22.07 kg/m  Physical Exam Constitutional:      General: She is not in acute distress.    Appearance: She is ill-appearing (Chronically ill-appearing.  Sitting in wheelchair.  Very weak, thin, frail, and pale). She is not toxic-appearing.     Comments: Very angry, upset, emotionally labile.;  Notable weight loss.  HENT:     Head: Normocephalic and atraumatic.  Neck:     Vascular: No carotid bruit or JVD.  Cardiovascular:     Rate and Rhythm: Normal rate and regular rhythm. Occasional Extrasystoles are present.    Chest Wall: PMI is not displaced.     Pulses: Intact distal pulses.     Heart sounds: S1 normal and S2 normal. Murmur (1/6 SEM at RUSB.) heard.     No friction rub. No gallop.  Pulmonary:     Effort: Pulmonary effort is normal. No respiratory distress.     Breath sounds: Normal breath sounds. No stridor. No wheezing, rhonchi or rales.  Chest:     Chest wall: Tenderness present.  Musculoskeletal:        General: No swelling.     Cervical back: Normal range of motion and neck supple.  Skin:    General: Skin is warm and dry.     Coloration: Skin is pale.  Neurological:     General: No focal deficit present.     Mental Status: She is alert and oriented to person, place, and time.     Gait: Gait abnormal (Week.   In a wheelchair.).  Psychiatric:     Comments: Emotionally labile.  Continuously ambulates about her hospital stay, and how she was not properly cared for.  She also vehemently complains about how much pain she had from her recorder, blaming the ?"Student" who performed the procedure?  Multiple rants throughout the course of long clinic visit.     Adult ECG Report Not checked   Recent Labs:   reviewed Lab Results  Component Value Date   CHOL 215 (H) 08/20/2022   HDL 67 08/20/2022   LDLCALC 107 (H) 08/20/2022  LDLDIRECT 146.0 11/21/2020   TRIG 239 (H) 08/20/2022   CHOLHDL 3.2 08/20/2022   Lab Results  Component Value Date   CREATININE 0.74 08/20/2022   BUN 17 08/20/2022   NA 141 08/20/2022   K 5.1 08/20/2022   CL 101 08/20/2022   CO2 25 08/20/2022      Latest Ref Rng & Units 08/20/2022    9:20 AM 05/30/2022    8:31 AM 05/23/2022    4:26 AM  CBC  WBC 3.4 - 10.8 x10E3/uL 5.4  6.5  4.0   Hemoglobin 11.1 - 15.9 g/dL 10.7  9.8  10.0   Hematocrit 34.0 - 46.6 % 32.1  29.2  29.6   Platelets 150 - 450 x10E3/uL 285  263.0  194     Lab Results  Component Value Date   HGBA1C 5.1 08/07/2021   Lab Results  Component Value Date   TSH 1.540 08/20/2022    ================================================== I spent a total of 56 minutes with the patient spent in direct patient consultation.  Additional time spent with chart review  / charting (studies, outside notes, etc): 45 min Total Time: 101 min  Current medicines are reviewed at length with the patient today.  (+/- concerns) none  Notice: This dictation was prepared with Dragon dictation along with smart phrase technology. Any transcriptional errors that result from this process are unintentional and may not be corrected upon review.  Studies Ordered:   No orders of the defined types were placed in this encounter.  Meds ordered this encounter  Medications   DISCONTD: losartan (COZAAR) 25 MG tablet    Sig: Take 2  tablets (50 mg total) by mouth daily.    Dispense:  180 tablet    Refill:  23    Quantity change and direction    Patient Instructions / Medication Changes & Studies & Tests Ordered   Patient Instructions  Medication Instructions:    Start taking Cozaar ( Losartan )  50 mg daily, if you find that you dizzy or swimmy head taking theis dose , then separateit take 25 mg twice a day    *If you need a refill on your cardiac medications before your next appointment, please call your pharmacy*   Lab Work: Not needed If you have labs (blood work) drawn today and your tests are completely normal, you will receive your results only by: MyChart Message (if you have MyChart) OR A paper copy in the mail If you have any lab test that is abnormal or we need to change your treatment, we will call you to review the results.   Testing/Procedures:  Not needed  Follow-Up: At [facility], you and your health needs are our priority.  As part of our continuing mission to provide you with exceptional heart care, we have created designated Provider Care Teams.  These Care Teams include your primary Cardiologist (physician) and Advanced Practice Providers (APPs -  Physician Assistants and Nurse Practitioners) who all work together to provide you with the care you need, when you need it.     Your next appointment:   6 month(s)  The format for your next appointment:   In Person  Provider:   [name], MD    Other Instructions   Discuss with Dr Marissa [name] about CT - Pulm nodule - to get his take on the result of CT     [name], MD, MS Marissa [name], M.D., M.S. Interventional Geologist, engineering  Pager # [phone]  Phone # [phone] [address] [place], [place] [address]   Thank you for choosing [facility]!!

## 2022-09-03 ENCOUNTER — Other Ambulatory Visit: Payer: Self-pay | Admitting: Internal Medicine

## 2022-09-03 ENCOUNTER — Encounter: Payer: Self-pay | Admitting: Internal Medicine

## 2022-09-03 DIAGNOSIS — R197 Diarrhea, unspecified: Secondary | ICD-10-CM

## 2022-09-04 ENCOUNTER — Other Ambulatory Visit: Payer: Self-pay

## 2022-09-04 ENCOUNTER — Encounter: Payer: Self-pay | Admitting: Cardiology

## 2022-09-04 MED ORDER — LOSARTAN POTASSIUM 25 MG PO TABS: 50.0000 mg | ORAL_TABLET | Freq: Every day | ORAL | 2 refills | Status: DC

## 2022-09-08 NOTE — Assessment & Plan Note (Signed)
No notable change on current echo.  Can give her some time to recheck at least 1 to 2 years.

## 2022-09-08 NOTE — Assessment & Plan Note (Addendum)
Interestingly, she seems to be in regular rhythm today by exam.  She does not know whether she has or is not in A-fib.  No longer has ILR in place. With CHA2DS2-VASc or 7, remains on Xarelto although she is not currently taking it. I did reiterate the benefit of taking Xarelto, but if she wants to hold for some of the bruising to clear up, this would be acceptable.  They do understand the risk from a stroke perspective of holding Xarelto.  Continue rate control with beta-blocker.  No more AAD optioins On low-dose Xarelto for stroke prophylaxis

## 2022-09-08 NOTE — Assessment & Plan Note (Signed)
None nodule seen again on chest MRI.  I have forwarded this to Dr. [name] would probably recommend pulmonary consultation, but she is also due to see Dr. Roxan Hockey next week.  I would ask her to also run it by him and I will have to check on the MRI result.  We will forward him this note.

## 2022-09-08 NOTE — Assessment & Plan Note (Signed)
Significant weight loss.  Now seems to stabilize her pressures.  Continue encourage adequate hydration and nutrition.

## 2022-09-08 NOTE — Assessment & Plan Note (Signed)
It seems like her average blood pressures are pretty high at this point.  We can probably increase her underlying ARB dose.  We will increase to losartan 50 mg along with Bystolic.  Continue using  Clonidine 0.1 mg as needed for sustained SBP >160 mmHg.

## 2022-09-08 NOTE — Assessment & Plan Note (Signed)
Stable findings on MRI of the chest.  4.3 cm.

## 2022-09-08 NOTE — Assessment & Plan Note (Signed)
Now essentially can be permanent A-fib when she goes into heart.  CHA2DS2-VASc score is 7.  She has significant bruising and would like to not be on anything.  At this point while I understand it is important for her to heal up some or bruising, I do think that long-term treatment with Xarelto is warranted.  If she wants to hold it then this is against my recommendations that understand the risks of stroke.

## 2022-09-09 ENCOUNTER — Ambulatory Visit: Payer: Self-pay

## 2022-09-09 NOTE — Patient Instructions (Signed)
Visit Information  Thank you for taking time to visit with me today. Please don't hesitate to contact me if I can be of assistance to you.   Following are the goals we discussed today:   Goals Addressed             This Visit's Progress    Health management       Care Coordination Interventions: Confirmed has spoke with LCSW Advised Mr. Shor to continue to communicate with provider with questions, concerns or worsening condition Encouraged to continue to check and record blood pressure. Encouraged to give patient as needed blood pressure medications for SBP >160 (top number) per cardiologist recommendation and encouraged to recheck blood pressure to make sure it is coming down and record. Discussed with patient's husband/care giver Diastolic Blood Pressure (bottom number) recommendation <90 per american heart association.   Active listening Discussed upcoming Holidays and encouraged Healthy eating.        Our next appointment is by telephone on 10/10/22 at 11:30 am  Please call the care guide team at [phone] if you need to cancel or reschedule your appointment.   If you are experiencing a Mental Health or [place] or need someone to talk to, please call the Suicide and Crisis Lifeline: 988  Patient verbalizes understanding of instructions and care plan provided today and agrees to view in [place]. Active MyChart status and patient understanding of how to access instructions and care plan via MyChart confirmed with patient.     Thea Silversmith, RN, MSN, BSN, [place] Coordinator [phone]

## 2022-09-09 NOTE — Patient Outreach (Signed)
  Care Coordination   Follow Up Visit Note   09/09/2022 Name: Marissa [name] MRN: [id number] DOB: 08-Aug-1946  Marissa [name] is a 76 y.o. year old female who sees [name], MD for primary care. I spoke with  Marissa [name] by phone today.  What matters to the patients health and wellness today?  Mr. Both reports they have had frequent communication with patient's providers. Office visit with cardiologist completed on  09/02/22. He reports patient has an upcoming appointment with cardiothoracic provider on 09/11/22. Mr. Kramme reports that they will pursue recommendations from LCSW for counseling when their schedule is less hectic.   Goals Addressed             This Visit's Progress    Care Coordinator Health management       Care Coordination Interventions: Confirmed patient and husband have talked with LCSW Advised Mr. Lanum to continue to communicate with providers with questions, concerns or worsening condition Encouraged to continue to check and record blood pressure. Encouraged to give patient as needed blood pressure medications for SBP >160 (top number) per cardiologist recommendation and encouraged to recheck blood pressure to make sure it is coming down and record. Discussed with patient's husband Diastolic Blood Pressure (bottom number) should contact provider if constantly >90 or with questions or concerns.   Active listening Discussed upcoming Holidays and encouraged Healthy eating.        SDOH assessments and interventions completed:  No  Care Coordination Interventions Activated:  Yes  Care Coordination Interventions:  Yes, provided   Follow up plan: Follow up call scheduled for 10/10/22    Encounter Outcome:  Pt. Visit Completed   Thea Silversmith, RN, MSN, BSN, [place] Coordinator [phone]

## 2022-09-10 ENCOUNTER — Other Ambulatory Visit: Payer: Self-pay | Admitting: Thoracic Surgery (Cardiothoracic Vascular Surgery)

## 2022-09-10 DIAGNOSIS — R911 Solitary pulmonary nodule: Secondary | ICD-10-CM

## 2022-09-16 ENCOUNTER — Other Ambulatory Visit: Payer: Self-pay | Admitting: Cardiology

## 2022-09-16 ENCOUNTER — Other Ambulatory Visit: Payer: Medicare Other

## 2022-09-16 ENCOUNTER — Other Ambulatory Visit: Payer: Self-pay | Admitting: Internal Medicine

## 2022-09-16 ENCOUNTER — Telehealth: Payer: Self-pay | Admitting: Pharmacist

## 2022-09-16 ENCOUNTER — Encounter: Payer: Self-pay | Admitting: Pharmacist

## 2022-09-16 DIAGNOSIS — E039 Hypothyroidism, unspecified: Secondary | ICD-10-CM

## 2022-09-16 NOTE — Telephone Encounter (Signed)
This encounter was created in error - please disregard.

## 2022-09-16 NOTE — Telephone Encounter (Addendum)
   Outreach Note  09/16/2022 Name: Marissa [name] MRN: [id number] DOB: 04-17-1946  Receive a message from [place] requesting outreach to patient for medication review/education.   Was unable to reach patient via telephone today and have left HIPAA compliant voicemail asking patient to return my call.    Follow Up Plan: Will attempt to reach patient by telephone again within the next 14 days.  Wallace Cullens, PharmD, [facility] [phone]

## 2022-09-17 NOTE — Telephone Encounter (Signed)
Received referral from [facility] as below for medication review due to confusion about regimen. Successfully contacted patient's husband, Ed. He discussed with patient and they note that are very busy right now with plans, so declined to schedule until the week of 12/11. Scheduled with Wallace Cullens, PharmD.   Routing to PCP for FYI.    Catie Hedwig Morton, PharmD, [facility] [phone]

## 2022-09-18 ENCOUNTER — Ambulatory Visit (HOSPITAL_COMMUNITY)
Admission: RE | Admit: 2022-09-18 | Discharge: 2022-09-18 | Disposition: A | Discharge status: Home / Self Care | Payer: Medicare Other | Source: Ambulatory Visit | Attending: Thoracic Surgery (Cardiothoracic Vascular Surgery) | Admitting: Thoracic Surgery (Cardiothoracic Vascular Surgery)

## 2022-09-18 DIAGNOSIS — R911 Solitary pulmonary nodule: Secondary | ICD-10-CM | POA: Diagnosis not present

## 2022-09-24 ENCOUNTER — Ambulatory Visit: Payer: Medicare Other | Admitting: Thoracic Surgery (Cardiothoracic Vascular Surgery)

## 2022-09-24 ENCOUNTER — Encounter: Payer: Self-pay | Admitting: Thoracic Surgery (Cardiothoracic Vascular Surgery)

## 2022-09-24 VITALS — BP 176/75 | HR 70 | Resp 20 | Ht 64.0 in | Wt 126.0 lb

## 2022-09-24 DIAGNOSIS — I77810 Thoracic aortic ectasia: Secondary | ICD-10-CM

## 2022-09-24 NOTE — Progress Notes (Signed)
Marissa [name]       [place] [address]             [phone]     HPI: Marissa [name] returns for follow-up regarding her ascending aneurysm and a groundglass opacity in the right upper lobe.  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  She has been followed for an ascending aneurysm since 2012.  It has remained relatively stable over time.  She was last seen in the office by Enid Cutter in November 2022.  CT at that time showed no change in the aneurysm.  There was an unusual appearing groundglass opacity in around the right upper lobe segmental bronchi centrally.    She was hospitalized in June and July.  During that time she had a Tikosyn load for atrial fibrillation complicated by V-fib arrest.  She had issues with delirium.  She developed C. difficile colitis and continues to have issues related to that.  She recently had an MR angio to follow-up her aneurysm.  The groundglass opacity again was noted on that study.  She then had a CT scan and now returns for follow-up.   Past Medical History:  Diagnosis Date   Arthritis    Asthma    Bicuspid aortic valve 05/2017   Likely functional bicuspid aortic valve with sclerosis and no stenosis.   Bursitis    Cataract    mild   Dysthymia    Hemorrhoids    Hx of ulcerative colitis    per dr Arnoldo Morale as per pt.   Hyperlipidemia    Labile hypertension    managed - labile.   Migraine    Scoliosis    Slurred speech    temporal lobe area that is not a tumor causes occ slurred speech and inability to communicate/ words will not come out at the correct time   Spinal stenosis    Stroke ([facility]) 08/06/2021   Thoracic aortic aneurysm (HCC)    Stable 4.2-4.3 cm (followed by Dr. Roxan Hockey)    Thyroid disease      Current Outpatient Medications  Medication Sig Dispense Refill   acetaminophen (TYLENOL) 325 MG tablet Take 325 mg by mouth every 6 (six) hours as needed for moderate pain.     albuterol (VENTOLIN HFA) 108 (90 Base) MCG/ACT inhaler INHALE 2 PUFFS INTO THE LUNGS EVERY 6 HOURS AS NEEDED FOR WHEEZING OR SHORTNESS OF BREATH (Patient taking differently: Inhale 2 puffs into the lungs every 6 (six) hours as needed for shortness of breath or wheezing.) 6.7 g 2   ALPRAZolam (XANAX) 0.5 MG tablet Take 0.5 mg by mouth 2 (two) times daily as needed.     cloNIDine (CATAPRES) 0.2 MG tablet Take 0.1 mg by mouth 2 (two) times daily as needed (systolic over 384). if blood pressure is great than 536 systolic for more than an hour 20 tablet 6   Eszopiclone 3 MG TABS Take 3 mg by mouth at bedtime as needed.     feeding supplement, GLUCERNA SHAKE, (GLUCERNA SHAKE) LIQD Take 237 mLs by mouth daily as needed (if not eating meals).     FLUoxetine (PROZAC) 20 MG tablet Take 1 tablet (20 mg total) by mouth daily. (Patient taking differently: Take 10 mg by mouth daily.) 90 tablet 1   HYDROcodone-acetaminophen (NORCO/VICODIN) 5-325  MG tablet Take 1 tablet by mouth every 6 (six) hours as needed for moderate pain. 30 tablet 0   latanoprost (XALATAN) 0.005 % ophthalmic solution 1 drop at bedtime.     levothyroxine (SYNTHROID) 125 MCG tablet TAKE 1 TABLET(125 MCG) BY MOUTH DAILY 90 tablet 1   losartan (COZAAR) 25 MG tablet Take 2 tablets (50 mg total) by mouth daily. 180 tablet 2   magnesium oxide (MAG-OX) 400 MG tablet Take 1 tablet (400 mg total) by mouth 2 (two) times daily. 60 tablet 0   metoprolol tartrate (LOPRESSOR) 25 MG tablet Take 25 mg tablet as needed if heart rate is above 100 after waiting for 10 minutes , if systolic blood pressure is less than 110 do not take metoprolol. 30 tablet 6   Multiple Vitamin (MULTIVITAMIN WITH MINERALS) TABS tablet Take 1 tablet by mouth daily.     nebivolol  (BYSTOLIC) 10 MG tablet TAKE 1 TABLET(10 MG) BY MOUTH DAILY AT 8 PM 90 tablet 3   Respiratory Therapy Supplies (FLUTTER) DEVI Use as directed 1 each 0   triamcinolone cream (KENALOG) 0.1 % Apply 1 Application topically 2 (two) times daily. 100 g 0   No current facility-administered medications for this visit.    Physical Exam BP (!) 176/75   Pulse 70   Resp 20   Ht 5' 4"  (1.626 m)   Wt 126 lb (57.2 kg)   SpO2 99% Comment: RA  BMI 21.63 kg/m  Pleasant 76 year old woman in no acute distress Neuro no focal motor deficit, some slurred speech mostly appropriate content Cardiac regular rate and rhythm with a 2/6 systolic murmur Lungs clear with equal breath sounds bilaterally No cervical or supraclavicular adenopathy  Diagnostic Tests: CT CHEST WITHOUT CONTRAST   TECHNIQUE: Multidetector CT imaging of the chest was performed using thin slice collimation for electromagnetic bronchoscopy planning purposes, without intravenous contrast.   RADIATION DOSE REDUCTION: This exam was performed according to the departmental dose-optimization program which includes automated exposure control, adjustment of the mA and/or kV according to patient size and/or use of iterative reconstruction technique.   COMPARISON:  Chest CT dated September 11, 2021   FINDINGS: Cardiovascular: Normal heart size. No pericardial effusion. Dilated ascending thoracic aorta, measuring up to 4.3 cm, unchanged when compared to prior exam remeasured in similar plane. Moderate calcified plaque of the thoracic aorta.   Mediastinum/Nodes: Small hiatal hernia. No pathologically enlarged lymph nodes seen in the chest. Calcified mediastinal and right hilar lymph nodes, likely sequela of prior granulomatous infection   Lungs/Pleura: Central airways are patent. Large irregular ground-glass nodule of the right upper lobe measuring 4.4 x 4.3 (axial) x 4.6 cm (coronal), previously 2.9 x 2.5 x 4.1 cm when remeasured in  similar plane. Nodule extends into the superior portion of the right lower lobe. No consolidation, pleural effusion or pneumothorax.   Upper Abdomen: No acute abnormality.   Musculoskeletal: Chronic appearing sternal fracture with nonunion. No chest wall mass or suspicious bone lesions identified.   IMPRESSION: 1. Large irregular ground-glass nodule of the right upper lobe which partially extends into the superior portion of the right lower lobe measuring up to 4.6 cm, increased in size when compared with prior exam and compatible with primary lung adenocarcinoma. 2. Dilated ascending thoracic aorta, measuring up to 4.3 cm, unchanged when compared to prior exam remeasured in similar plane. Recommend annual imaging followup by CTA or MRA. This recommendation follows 2010 ACCF/AHA/AATS/ACR/ASA/SCA/SCAI/SIR/STS/SVM Guidelines for the Diagnosis and Management of Patients with Thoracic Aortic  Disease. Circulation. 2010; 121: P102-H852. Aortic aneurysm NOS (ICD10-I71.9) 3. Chronic appearing sternal fracture with nonunion. 4. Aortic Atherosclerosis (ICD10-I70.0).     Electronically Signed   By: Yetta Glassman M.D.   On: 09/20/2022 17:04  I personally reviewed the CT images.  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  There is a persistent groundglass opacity in the right upper lobe.  When measured in the coronal views I see very minimal change from a year ago.  There is a question of extension across the fissure into the superior segment.  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  Ascending aneurysm-stable at 4.3 cm.  Continued annual follow-up.  She really would not be a candidate for surgery.  Groundglass opacity right upper lobe-stable to slightly  increased from a year ago.  Persistence is a concern.  This could represent an adenocarcinoma with lepidic spread.  Would be extremely unusual for that to cross the fissure.  It is really unclear if there it crosses the fissure or the fissure is just poorly formed in that area.  I discussed the findings and reviewed the images with Mr. and Mrs. Lad.  We talked about whether she would be willing to undergo treatment if this were cancer.  She would not be a candidate for surgical resection but might be a candidate for radiation.  We could do molecular testing and see if she is a candidate for any other treatment as well.  Clinically this would be a T2, N0 lesion.  I think biopsy would be the next appropriate step.  PET/CT is unlikely to be very informative given the lack of any solid component.  If biopsies show cancer then she definitely will need a PET/CT.  I recommended that we proceed with a navigational bronchoscopy for biopsy.  They understand this would be done in the operating room under general anesthesia but would plan to do it on an outpatient basis.  I informed them of the general nature of the procedure.  I informed him of the indications, risk, benefits, and alternatives.  They understand there is no guarantee of a definitive diagnosis.  They understand the risks include, but are not limited to death, MI, DVT, PE, bleeding, pneumothorax, failure to make a diagnosis, as well as possibility of other unforeseeable complications.  Her husband is the primary Media planner.  They do agree to proceed.   Plan: Electromagnetic navigational bronchoscopy on Thursday, 10/10/2022  Melrose Nakayama, MD [facility] [phone]

## 2022-09-25 ENCOUNTER — Encounter: Payer: Self-pay | Admitting: Cardiology

## 2022-09-25 ENCOUNTER — Encounter: Payer: Self-pay | Admitting: *Deleted

## 2022-09-25 ENCOUNTER — Other Ambulatory Visit: Payer: Self-pay | Admitting: *Deleted

## 2022-09-25 DIAGNOSIS — R911 Solitary pulmonary nodule: Secondary | ICD-10-CM

## 2022-09-25 NOTE — Telephone Encounter (Signed)
I guess the question is what the blood pressures when they are more normal.  I would not want to further increase the losartan if her blood pressures are in the normal range otherwise.  If her pressures are averaging mostly in the 150 range and just increase the losartan to 100 mg daily.  Still okay to use clonidine as needed.  I do think a lot of her blood pressure to be high is because she is stressed and upset about feeling.  Probably more so than the other way around.  [name], MD

## 2022-09-27 ENCOUNTER — Other Ambulatory Visit: Payer: Medicare Other

## 2022-09-27 DIAGNOSIS — R197 Diarrhea, unspecified: Secondary | ICD-10-CM

## 2022-10-01 ENCOUNTER — Other Ambulatory Visit: Payer: Self-pay | Admitting: Internal Medicine

## 2022-10-01 ENCOUNTER — Encounter: Payer: Self-pay | Admitting: Internal Medicine

## 2022-10-01 LAB — GI PROFILE, STOOL, PCR
Adenovirus F 40/41: NOT DETECTED
Astrovirus: NOT DETECTED
C difficile toxin A/B: DETECTED — AB
Campylobacter: NOT DETECTED
Cryptosporidium: NOT DETECTED
Cyclospora cayetanensis: NOT DETECTED
Entamoeba histolytica: NOT DETECTED
Enteroaggregative E coli: NOT DETECTED
Enteropathogenic E coli: DETECTED — AB
Enterotoxigenic E coli: NOT DETECTED
Giardia lamblia: NOT DETECTED
Norovirus GI/GII: NOT DETECTED
Plesiomonas shigelloides: NOT DETECTED
Rotavirus A: NOT DETECTED
Salmonella: NOT DETECTED
Sapovirus: NOT DETECTED
Shiga-toxin-producing E coli: NOT DETECTED
Shigella/Enteroinvasive E coli: NOT DETECTED
Vibrio cholerae: NOT DETECTED
Vibrio: NOT DETECTED
Yersinia enterocolitica: NOT DETECTED

## 2022-10-01 MED ORDER — VANCOMYCIN HCL 125 MG PO CAPS: ORAL_CAPSULE | ORAL | 0 refills | Status: AC

## 2022-10-03 ENCOUNTER — Encounter: Payer: Self-pay | Admitting: Internal Medicine

## 2022-10-03 MED ORDER — HYDROCODONE-ACETAMINOPHEN 5-325 MG PO TABS: 1.0000 | ORAL_TABLET | Freq: Four times a day (QID) | ORAL | 0 refills | Status: DC | PRN

## 2022-10-04 ENCOUNTER — Other Ambulatory Visit: Payer: Self-pay | Admitting: *Deleted

## 2022-10-07 ENCOUNTER — Other Ambulatory Visit: Payer: Medicare Other | Admitting: Pharmacist

## 2022-10-07 ENCOUNTER — Other Ambulatory Visit (HOSPITAL_COMMUNITY): Payer: Medicare Other

## 2022-10-07 ENCOUNTER — Encounter: Payer: Self-pay | Admitting: Internal Medicine

## 2022-10-07 MED ORDER — ESZOPICLONE 3 MG PO TABS: 3.0000 mg | ORAL_TABLET | Freq: Every day | ORAL | 1 refills | Status: DC

## 2022-10-08 ENCOUNTER — Other Ambulatory Visit (HOSPITAL_COMMUNITY): Payer: Medicare Other

## 2022-10-10 ENCOUNTER — Encounter (HOSPITAL_COMMUNITY): Payer: Self-pay

## 2022-10-10 ENCOUNTER — Telehealth: Payer: Self-pay | Admitting: *Deleted

## 2022-10-10 ENCOUNTER — Ambulatory Visit (HOSPITAL_COMMUNITY): Admit: 2022-10-10 | Payer: Medicare Other | Admitting: Thoracic Surgery (Cardiothoracic Vascular Surgery)

## 2022-10-10 SURGERY — VIDEO BRONCHOSCOPY WITH ENDOBRONCHIAL NAVIGATION
Anesthesia: General

## 2022-10-10 NOTE — Progress Notes (Signed)
  Care Coordination Note  10/10/2022 Name: Marissa [name] MRN: [id number] DOB: April 01, 1946  Marissa [name] is a 76 y.o. year old female who is a primary care patient of [name], MD and is actively engaged with the care management team. I reached out to [place] by phone today to assist with re-scheduling a follow up visit with the RN Case Manager  Follow up plan: Unsuccessful telephone outreach attempt made. A HIPAA compliant phone message was left for the patient providing contact information and requesting a return call.   Julian Hy, [place] Direct Dial: [phone]

## 2022-10-10 NOTE — Progress Notes (Signed)
  Care Coordination Note  10/10/2022 Name: Marissa [name] MRN: [id number] DOB: August 26, 1946  Marissa [name] is a 76 y.o. year old female who is a primary care patient of [name], MD and is actively engaged with the care management team. I reached out to [place] by phone today to assist with re-scheduling a follow up visit with the RN Case Manager  Follow up plan: Telephone appointment with care management team member scheduled for: 11/11/2022  Julian Hy, [place] Direct Dial: [phone]

## 2022-10-11 ENCOUNTER — Encounter: Payer: Self-pay | Admitting: Internal Medicine

## 2022-10-14 ENCOUNTER — Telehealth: Payer: Self-pay

## 2022-10-14 ENCOUNTER — Other Ambulatory Visit: Payer: Self-pay | Admitting: Internal Medicine

## 2022-10-14 ENCOUNTER — Encounter: Payer: Self-pay | Admitting: Internal Medicine

## 2022-10-14 NOTE — Telephone Encounter (Signed)
PA has been sent in.   (Key: [id number])

## 2022-10-14 NOTE — Telephone Encounter (Signed)
PA has been approved

## 2022-11-04 ENCOUNTER — Other Ambulatory Visit: Payer: Self-pay | Admitting: Internal Medicine

## 2022-11-04 ENCOUNTER — Telehealth: Payer: Self-pay | Admitting: Physician Assistant

## 2022-11-04 ENCOUNTER — Other Ambulatory Visit: Payer: Self-pay

## 2022-11-04 ENCOUNTER — Telehealth: Payer: Self-pay

## 2022-11-04 DIAGNOSIS — A0472 Enterocolitis due to Clostridium difficile, not specified as recurrent: Secondary | ICD-10-CM

## 2022-11-04 NOTE — Telephone Encounter (Signed)
Pts husband has stated he was told that pt may need to be re-tested for C-Diff as she is still having the sxs since her last POS C-Diff testing. Pts husband has stated he will come in today to pick up stool kit. Pt is planning on leaving to go stay with her daughter at the end of the week and they are wanting to get the test and things done before hand.  Ed Torregrossa pts husband can be reached at [phone] with further instructions.
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
## 2022-11-05 ENCOUNTER — Other Ambulatory Visit: Payer: Self-pay

## 2022-11-05 DIAGNOSIS — A0472 Enterocolitis due to Clostridium difficile, not specified as recurrent: Secondary | ICD-10-CM

## 2022-11-05 NOTE — Telephone Encounter (Signed)
Spoke with daughter and she is going to see what she may do, scheduled an appt

## 2022-11-07 ENCOUNTER — Telehealth: Payer: Self-pay

## 2022-11-07 LAB — GI PROFILE, STOOL, PCR
Adenovirus F 40/41: NOT DETECTED
Astrovirus: NOT DETECTED
C difficile toxin A/B: DETECTED — AB
Campylobacter: NOT DETECTED
Cryptosporidium: NOT DETECTED
Cyclospora cayetanensis: NOT DETECTED
Entamoeba histolytica: NOT DETECTED
Enteroaggregative E coli: NOT DETECTED
Enteropathogenic E coli: DETECTED — AB
Enterotoxigenic E coli: NOT DETECTED
Giardia lamblia: NOT DETECTED
Norovirus GI/GII: NOT DETECTED
Plesiomonas shigelloides: NOT DETECTED
Rotavirus A: NOT DETECTED
Salmonella: NOT DETECTED
Sapovirus: NOT DETECTED
Shiga-toxin-producing E coli: NOT DETECTED
Shigella/Enteroinvasive E coli: NOT DETECTED
Vibrio cholerae: NOT DETECTED
Vibrio: NOT DETECTED
Yersinia enterocolitica: NOT DETECTED

## 2022-11-07 NOTE — Telephone Encounter (Signed)
[facility] called stating pt has 2 lab alerts..  C difficile toxin A/B: Detected POSITIVE  Enteropathogenic E coli : Detected POSITIVE

## 2022-11-11 ENCOUNTER — Ambulatory Visit: Payer: Self-pay

## 2022-11-11 NOTE — Patient Instructions (Signed)
Visit Information  Thank you for taking time to visit with me today. Please don't hesitate to contact me if I can be of assistance to you.   Following are the goals we discussed today:   Goals Addressed             This Visit's Progress    Care Coordinator Health management       Care Coordination Interventions: Reviewed upcoming/scheduled appointments Advised Mr. Dolby to continue to communicate with providers with questions, concerns or worsening condition Discussed counseling support re: caregiver stress. Mr. Strieter states he already has an appointment scheduled with provider associated with the Practice. Encouraged to call [facility] for care coordination as needed. RNCM confirmed he has contact number.       Our next appointment is by telephone on 12/09/22 at 11:00 am  Please call the care guide team at [phone] if you need to cancel or reschedule your appointment.   If you are experiencing a Mental Health or [place] or need someone to talk to, please call the Suicide and Crisis Lifeline: [place], RN, MSN, BSN, [place] [phone]

## 2022-11-11 NOTE — Patient Outreach (Signed)
  Care Coordination   Follow Up Visit Note   11/11/2022 Name: Marissa [name] MRN: [id number] DOB: 1946-06-10  Marissa [name] is a 77 y.o. year old female who sees Marissa Koch, MD for primary care. I spoke with  Marissa [name] by phone today.  What matters to the patients health and wellness today?  RNCM spoke with Marissa [name] who reports that Marissa [name] is currently at her daughter's house. Currently not sure of how long she will be there. He reports he is getting some caregiver respite and counseling support. He reports he is proud to say that he has not had a drink in about two weeks. Marissa [name] states, that Marissa [name] has an appointment with PCP on tomorrow.    Goals Addressed             This Visit's Progress    Care Coordinator Health management       Care Coordination Interventions: Reviewed upcoming/scheduled appointments Advised Marissa [name] to continue to communicate with providers with questions, concerns or worsening condition Discussed counseling support re: caregiver stress. Marissa [name] states he already has an appointment scheduled with provider associated with the Practice. Encouraged to call [facility] for care coordination as needed. RNCM confirmed he has contact number.       SDOH assessments and interventions completed:  No  Care Coordination Interventions:  Yes, provided   Follow up plan: Follow up call scheduled for 12/12/22    Encounter Outcome:  Pt. Visit Completed   Marissa Silversmith, RN, MSN, BSN, [place] Coordinator [phone]

## 2022-11-12 ENCOUNTER — Telehealth: Payer: Self-pay | Admitting: Internal Medicine

## 2022-11-12 ENCOUNTER — Encounter: Payer: Self-pay | Admitting: Internal Medicine

## 2022-11-12 ENCOUNTER — Other Ambulatory Visit: Payer: Self-pay | Admitting: Internal Medicine

## 2022-11-12 ENCOUNTER — Ambulatory Visit (INDEPENDENT_AMBULATORY_CARE_PROVIDER_SITE_OTHER): Payer: Medicare Other | Admitting: Internal Medicine

## 2022-11-12 VITALS — BP 160/84 | HR 100 | Temp 98.0°F | Ht 64.0 in | Wt 129.0 lb

## 2022-11-12 DIAGNOSIS — E559 Vitamin D deficiency, unspecified: Secondary | ICD-10-CM | POA: Diagnosis not present

## 2022-11-12 DIAGNOSIS — A0472 Enterocolitis due to Clostridium difficile, not specified as recurrent: Secondary | ICD-10-CM

## 2022-11-12 DIAGNOSIS — E871 Hypo-osmolality and hyponatremia: Secondary | ICD-10-CM

## 2022-11-12 DIAGNOSIS — I469 Cardiac arrest, cause unspecified: Secondary | ICD-10-CM

## 2022-11-12 DIAGNOSIS — E43 Unspecified severe protein-calorie malnutrition: Secondary | ICD-10-CM

## 2022-11-12 DIAGNOSIS — E538 Deficiency of other specified B group vitamins: Secondary | ICD-10-CM | POA: Diagnosis not present

## 2022-11-12 DIAGNOSIS — R413 Other amnesia: Secondary | ICD-10-CM

## 2022-11-12 DIAGNOSIS — R0989 Other specified symptoms and signs involving the circulatory and respiratory systems: Secondary | ICD-10-CM | POA: Diagnosis not present

## 2022-11-12 DIAGNOSIS — E038 Other specified hypothyroidism: Secondary | ICD-10-CM | POA: Diagnosis not present

## 2022-11-12 DIAGNOSIS — A040 Enteropathogenic Escherichia coli infection: Secondary | ICD-10-CM

## 2022-11-12 LAB — COMPREHENSIVE METABOLIC PANEL
ALT: 51 U/L — ABNORMAL HIGH (ref 0–35)
AST: 40 U/L — ABNORMAL HIGH (ref 0–37)
Albumin: 4.7 g/dL (ref 3.5–5.2)
Alkaline Phosphatase: 59 U/L (ref 39–117)
BUN: 11 mg/dL (ref 6–23)
CO2: 24 mEq/L (ref 19–32)
Calcium: 10 mg/dL (ref 8.4–10.5)
Chloride: 92 mEq/L — ABNORMAL LOW (ref 96–112)
Creatinine, Ser: 0.89 mg/dL (ref 0.40–1.20)
GFR: 62.32 mL/min (ref >60.00)
Glucose, Bld: 98 mg/dL (ref 70–99)
Potassium: 4.2 mEq/L (ref 3.5–5.1)
Sodium: 129 mEq/L — ABNORMAL LOW (ref 135–145)
Total Bilirubin: 0.6 mg/dL (ref 0.2–1.2)
Total Protein: 7.1 g/dL (ref 6.0–8.3)

## 2022-11-12 LAB — TSH: TSH: 1.32 u[IU]/mL (ref 0.35–5.50)

## 2022-11-12 LAB — T4, FREE: Free T4: 1.12 ng/dL (ref 0.60–1.60)

## 2022-11-12 LAB — CBC
HCT: 31.7 % — ABNORMAL LOW (ref 36.0–46.0)
Hemoglobin: 10.9 g/dL — ABNORMAL LOW (ref 12.0–15.0)
MCHC: 34.4 g/dL (ref 30.0–36.0)
MCV: 89.2 fl (ref 78.0–100.0)
Platelets: 296 10*3/uL (ref 150.0–400.0)
RBC: 3.55 Mil/uL — ABNORMAL LOW (ref 3.87–5.11)
RDW: 17.2 % — ABNORMAL HIGH (ref 11.5–15.5)
WBC: 6.7 10*3/uL (ref 4.0–10.5)

## 2022-11-12 LAB — MAGNESIUM: Magnesium: 1.9 mg/dL (ref 1.5–2.5)

## 2022-11-12 LAB — VITAMIN D 25 HYDROXY (VIT D DEFICIENCY, FRACTURES): VITD: 47.76 ng/mL (ref 30.00–100.00)

## 2022-11-12 LAB — VITAMIN B12: Vitamin B-12: 926 pg/mL — ABNORMAL HIGH (ref 211–911)

## 2022-11-12 MED ORDER — AZITHROMYCIN 250 MG PO TABS: 1000.0000 mg | ORAL_TABLET | Freq: Once | ORAL | 0 refills | Status: AC

## 2022-11-12 NOTE — Assessment & Plan Note (Signed)
It is unclear to me if she still has active infection. She states no diarrhea, then 2 times a day, then quite a few a day. She was given extended taper vancomycin which she took appropriately and frequency/amount stool has decreased. She is unsure if this is increasing again since she is off the medicine. Checking C dif [facility] and toxin to see if we can clarify active infection versus persistent toxin. May need alternative treatment or referral to ID if persistent/recurrent infection as this would represent 2nd recurrence.

## 2022-11-12 NOTE — Telephone Encounter (Signed)
Pt's daughter called after her appointment and states that they discussed getting a stool kit for the pt, but daughter was unsure if it was being sent to them or if it needed to be picked up at a pharmacy.  They were also unsure of what to do once they had the kit.   Please call Ebony Hail (pt's daughter) to advise: [phone]

## 2022-11-12 NOTE — Assessment & Plan Note (Signed)
Checking magnesium level.

## 2022-11-12 NOTE — Progress Notes (Signed)
   Subjective:   Patient ID: Marissa [name], female    DOB: [birth year], 77 y.o.   MRN: [id number]  HPI The patient is a 77 YO female coming in for a lot of concerns. With daughter and she helps provide some of the history.   Review of Systems  Constitutional:  Positive for activity change, appetite change and fatigue.  HENT: Negative.    Eyes: Negative.   Respiratory:  Negative for cough, chest tightness and shortness of breath.   Cardiovascular:  Negative for chest pain, palpitations and leg swelling.  Gastrointestinal:  Positive for diarrhea. Negative for abdominal distention, abdominal pain, constipation, nausea and vomiting.  Musculoskeletal:  Positive for arthralgias, back pain and myalgias.  Skin: Negative.   Neurological: Negative.   Psychiatric/Behavioral:  Positive for decreased concentration, dysphoric mood and sleep disturbance.     Objective:  Physical Exam Constitutional:      Appearance: She is well-developed.     Comments: Chronically ill appearing  HENT:     Head: Normocephalic and atraumatic.  Cardiovascular:     Rate and Rhythm: Normal rate and regular rhythm.  Pulmonary:     Effort: Pulmonary effort is normal. No respiratory distress.     Breath sounds: Normal breath sounds. No wheezing or rales.  Abdominal:     General: Bowel sounds are normal. There is no distension.     Palpations: Abdomen is soft.     Tenderness: There is no abdominal tenderness. There is no rebound.  Musculoskeletal:        General: Tenderness present.     Cervical back: Normal range of motion.  Skin:    General: Skin is warm and dry.  Neurological:     Mental Status: She is alert. Mental status is at baseline. She is disoriented.     Coordination: Coordination abnormal.     Comments: Overall at baseline with some confusion and some confabulation     Vitals:   11/12/22 1000  BP: (!) 160/84  Pulse: 100  Temp: 98 F (36.7 C)  TempSrc: Oral  SpO2: 99%  Weight: 129 lb (58.5  kg)  Height: 5\' 4"  (1.626 m)    Assessment & Plan:  Visit time 35 minutes in face to face communication with patient and coordination of care, additional 10 minutes spent in record review, coordination or care, ordering tests, communicating/referring to other healthcare professionals, documenting in medical records all on the same day of the visit for total time 45 minutes spent on the visit.

## 2022-11-12 NOTE — Assessment & Plan Note (Signed)
She has had positive twice for this on stool study about 2 weeks apart. She is still having diarrhea although the frequency is not clear at this time. We will treat with 1000 mg azithromycin one time to clear this so we can have better clarity if this or C dif is impacting GI symptoms.

## 2022-11-12 NOTE — Telephone Encounter (Signed)
Spoke with patients daughter and have explained to her on what to do once she received the kit.

## 2022-11-12 NOTE — Assessment & Plan Note (Signed)
Checking vitamin D level and adjust as needed.  

## 2022-11-12 NOTE — Assessment & Plan Note (Signed)
Currently with cognitive impairment and weakness. Needs PT/OT/nursing assessment and rehab in home as she [place] unable to leave home without assistance.

## 2022-11-12 NOTE — Assessment & Plan Note (Signed)
Checking B12 and adjust as needed. 

## 2022-11-12 NOTE — Telephone Encounter (Signed)
Patient called and said that the referral did not reach [facility].  The fax # [phone]

## 2022-11-12 NOTE — Assessment & Plan Note (Signed)
Weight is low and she is eating less recently. She is not drinking a lot of fluids currently. Still having diarrhea hard to quantify amount. Checking albumin today to help assess.

## 2022-11-12 NOTE — Patient Instructions (Addendum)
We will check a test to see if C dif is still active.   We have sent in azithromycin to take 4 pills once to help   Use vaseline 2-3 times a day on the bottom to help. Use wipes instead of toilet paper to help this heal.  We are checking the labs today.

## 2022-11-12 NOTE — Assessment & Plan Note (Signed)
Checking CMP and is having diarrhea recently which can trigger. She is not clearly different from baseline mentation. She does have sundowning symptoms.

## 2022-11-12 NOTE — Assessment & Plan Note (Signed)
It is a bit difficult to assess today given her daughter is with her and not her normal caretaker. She is having clear sundowning symptoms and her memory worsened after her cardiac arrest in 2023.

## 2022-11-13 NOTE — Telephone Encounter (Signed)
Patient's daughter called and said that [facility] has still not received the referral - please advise.

## 2022-11-15 NOTE — Telephone Encounter (Signed)
There  was no mention of [facility]  on the referral so it was sent elsewhere. We have contacted that office to cancel the referral. I have sent a message to my [facility] and she states she has accepted the referral. They will reach out to the pt to set up a [place].

## 2022-11-15 NOTE — Telephone Encounter (Signed)
I have contacted Marissa [name] and left a voicemail letting her know that [facility] has received the referral and someone will be reaching out to them soon.

## 2022-11-18 ENCOUNTER — Other Ambulatory Visit: Payer: Self-pay | Admitting: Internal Medicine

## 2022-11-18 ENCOUNTER — Other Ambulatory Visit: Payer: Medicare Other

## 2022-11-18 DIAGNOSIS — A0472 Enterocolitis due to Clostridium difficile, not specified as recurrent: Secondary | ICD-10-CM

## 2022-11-19 LAB — C. DIFFICILE GDH AND TOXIN A/B
GDH ANTIGEN: NOT DETECTED
TOXIN A AND B: NOT DETECTED

## 2022-11-21 ENCOUNTER — Telehealth: Payer: Self-pay | Admitting: Internal Medicine

## 2022-11-21 DIAGNOSIS — E559 Vitamin D deficiency, unspecified: Secondary | ICD-10-CM | POA: Diagnosis not present

## 2022-11-21 DIAGNOSIS — Z8673 Personal history of transient ischemic attack (TIA), and cerebral infarction without residual deficits: Secondary | ICD-10-CM | POA: Diagnosis not present

## 2022-11-21 DIAGNOSIS — E538 Deficiency of other specified B group vitamins: Secondary | ICD-10-CM | POA: Diagnosis not present

## 2022-11-21 DIAGNOSIS — A0472 Enterocolitis due to Clostridium difficile, not specified as recurrent: Secondary | ICD-10-CM | POA: Diagnosis not present

## 2022-11-21 DIAGNOSIS — Z9181 History of falling: Secondary | ICD-10-CM | POA: Diagnosis not present

## 2022-11-21 DIAGNOSIS — F03918 Unspecified dementia, unspecified severity, with other behavioral disturbance: Secondary | ICD-10-CM | POA: Diagnosis not present

## 2022-11-21 DIAGNOSIS — E43 Unspecified severe protein-calorie malnutrition: Secondary | ICD-10-CM | POA: Diagnosis not present

## 2022-11-21 DIAGNOSIS — Z7982 Long term (current) use of aspirin: Secondary | ICD-10-CM | POA: Diagnosis not present

## 2022-11-21 DIAGNOSIS — G479 Sleep disorder, unspecified: Secondary | ICD-10-CM | POA: Diagnosis not present

## 2022-11-21 DIAGNOSIS — E871 Hypo-osmolality and hyponatremia: Secondary | ICD-10-CM | POA: Diagnosis not present

## 2022-11-21 DIAGNOSIS — E038 Other specified hypothyroidism: Secondary | ICD-10-CM | POA: Diagnosis not present

## 2022-11-21 DIAGNOSIS — M791 Myalgia, unspecified site: Secondary | ICD-10-CM | POA: Diagnosis not present

## 2022-11-21 DIAGNOSIS — I4901 Ventricular fibrillation: Secondary | ICD-10-CM | POA: Diagnosis not present

## 2022-11-21 DIAGNOSIS — I4891 Unspecified atrial fibrillation: Secondary | ICD-10-CM | POA: Diagnosis not present

## 2022-11-21 DIAGNOSIS — I10 Essential (primary) hypertension: Secondary | ICD-10-CM | POA: Diagnosis not present

## 2022-11-21 DIAGNOSIS — F05 Delirium due to known physiological condition: Secondary | ICD-10-CM | POA: Diagnosis not present

## 2022-11-21 DIAGNOSIS — Z8674 Personal history of sudden cardiac arrest: Secondary | ICD-10-CM | POA: Diagnosis not present

## 2022-11-21 NOTE — Telephone Encounter (Signed)
Arnold from [facility] called after he did his assessment and is requesting verbal orders for home health PT for: 2X for 5 weeks  & 1X for 1 week  Also wanted to inform us of a lvele 2 drug interaction with  cloNIDine and ebivolol  & cloNIDine and metoprolol tartrate   States pt is using cloNIDine very infrequently, but wanted [place] to be aware.  Please call Roselie Awkward to confirm orders or for additional info: [phone]

## 2022-11-22 NOTE — Telephone Encounter (Signed)
Spoke with arnold and gave okay verbals for patient

## 2022-11-22 NOTE — Telephone Encounter (Signed)
Ok for verbals 

## 2022-11-25 ENCOUNTER — Other Ambulatory Visit: Payer: Self-pay | Admitting: Internal Medicine

## 2022-11-25 ENCOUNTER — Encounter: Payer: Self-pay | Admitting: Internal Medicine

## 2022-11-25 DIAGNOSIS — E538 Deficiency of other specified B group vitamins: Secondary | ICD-10-CM | POA: Diagnosis not present

## 2022-11-25 DIAGNOSIS — E559 Vitamin D deficiency, unspecified: Secondary | ICD-10-CM | POA: Diagnosis not present

## 2022-11-25 DIAGNOSIS — F05 Delirium due to known physiological condition: Secondary | ICD-10-CM | POA: Diagnosis not present

## 2022-11-25 DIAGNOSIS — I4901 Ventricular fibrillation: Secondary | ICD-10-CM | POA: Diagnosis not present

## 2022-11-25 DIAGNOSIS — Z7982 Long term (current) use of aspirin: Secondary | ICD-10-CM | POA: Diagnosis not present

## 2022-11-25 DIAGNOSIS — Z8673 Personal history of transient ischemic attack (TIA), and cerebral infarction without residual deficits: Secondary | ICD-10-CM | POA: Diagnosis not present

## 2022-11-25 DIAGNOSIS — I4891 Unspecified atrial fibrillation: Secondary | ICD-10-CM | POA: Diagnosis not present

## 2022-11-25 DIAGNOSIS — A0472 Enterocolitis due to Clostridium difficile, not specified as recurrent: Secondary | ICD-10-CM | POA: Diagnosis not present

## 2022-11-25 DIAGNOSIS — Z8674 Personal history of sudden cardiac arrest: Secondary | ICD-10-CM | POA: Diagnosis not present

## 2022-11-25 DIAGNOSIS — I10 Essential (primary) hypertension: Secondary | ICD-10-CM | POA: Diagnosis not present

## 2022-11-25 DIAGNOSIS — M791 Myalgia, unspecified site: Secondary | ICD-10-CM | POA: Diagnosis not present

## 2022-11-25 DIAGNOSIS — E871 Hypo-osmolality and hyponatremia: Secondary | ICD-10-CM | POA: Diagnosis not present

## 2022-11-25 DIAGNOSIS — G479 Sleep disorder, unspecified: Secondary | ICD-10-CM | POA: Diagnosis not present

## 2022-11-25 DIAGNOSIS — E038 Other specified hypothyroidism: Secondary | ICD-10-CM | POA: Diagnosis not present

## 2022-11-25 DIAGNOSIS — E43 Unspecified severe protein-calorie malnutrition: Secondary | ICD-10-CM | POA: Diagnosis not present

## 2022-11-25 DIAGNOSIS — F03918 Unspecified dementia, unspecified severity, with other behavioral disturbance: Secondary | ICD-10-CM | POA: Diagnosis not present

## 2022-11-26 DIAGNOSIS — H5340 Unspecified visual field defects: Secondary | ICD-10-CM | POA: Diagnosis not present

## 2022-11-26 DIAGNOSIS — H40013 Open angle with borderline findings, low risk, bilateral: Secondary | ICD-10-CM | POA: Diagnosis not present

## 2022-11-26 MED ORDER — HYDROCODONE-ACETAMINOPHEN 5-325 MG PO TABS: 1.0000 | ORAL_TABLET | Freq: Every day | ORAL | 0 refills | Status: DC | PRN

## 2022-11-26 MED ORDER — ALPRAZOLAM 0.5 MG PO TABS: 0.5000 mg | ORAL_TABLET | Freq: Two times a day (BID) | ORAL | 3 refills | Status: DC | PRN

## 2022-11-27 ENCOUNTER — Encounter: Payer: Self-pay | Admitting: Internal Medicine

## 2022-11-27 DIAGNOSIS — R197 Diarrhea, unspecified: Secondary | ICD-10-CM

## 2022-11-27 DIAGNOSIS — A0472 Enterocolitis due to Clostridium difficile, not specified as recurrent: Secondary | ICD-10-CM

## 2022-11-28 ENCOUNTER — Telehealth: Payer: Self-pay | Admitting: Cardiology

## 2022-11-28 ENCOUNTER — Encounter: Payer: Self-pay | Admitting: Nurse Practitioner

## 2022-11-28 ENCOUNTER — Encounter: Payer: Self-pay | Admitting: Cardiology

## 2022-11-28 DIAGNOSIS — E871 Hypo-osmolality and hyponatremia: Secondary | ICD-10-CM | POA: Diagnosis not present

## 2022-11-28 DIAGNOSIS — Z8674 Personal history of sudden cardiac arrest: Secondary | ICD-10-CM | POA: Diagnosis not present

## 2022-11-28 DIAGNOSIS — F05 Delirium due to known physiological condition: Secondary | ICD-10-CM | POA: Diagnosis not present

## 2022-11-28 DIAGNOSIS — E559 Vitamin D deficiency, unspecified: Secondary | ICD-10-CM | POA: Diagnosis not present

## 2022-11-28 DIAGNOSIS — Z7982 Long term (current) use of aspirin: Secondary | ICD-10-CM | POA: Diagnosis not present

## 2022-11-28 DIAGNOSIS — I4901 Ventricular fibrillation: Secondary | ICD-10-CM | POA: Diagnosis not present

## 2022-11-28 DIAGNOSIS — Z8673 Personal history of transient ischemic attack (TIA), and cerebral infarction without residual deficits: Secondary | ICD-10-CM | POA: Diagnosis not present

## 2022-11-28 DIAGNOSIS — E038 Other specified hypothyroidism: Secondary | ICD-10-CM | POA: Diagnosis not present

## 2022-11-28 DIAGNOSIS — G479 Sleep disorder, unspecified: Secondary | ICD-10-CM | POA: Diagnosis not present

## 2022-11-28 DIAGNOSIS — E43 Unspecified severe protein-calorie malnutrition: Secondary | ICD-10-CM | POA: Diagnosis not present

## 2022-11-28 DIAGNOSIS — I4891 Unspecified atrial fibrillation: Secondary | ICD-10-CM | POA: Diagnosis not present

## 2022-11-28 DIAGNOSIS — E538 Deficiency of other specified B group vitamins: Secondary | ICD-10-CM | POA: Diagnosis not present

## 2022-11-28 DIAGNOSIS — A0472 Enterocolitis due to Clostridium difficile, not specified as recurrent: Secondary | ICD-10-CM | POA: Diagnosis not present

## 2022-11-28 DIAGNOSIS — M791 Myalgia, unspecified site: Secondary | ICD-10-CM | POA: Diagnosis not present

## 2022-11-28 DIAGNOSIS — F03918 Unspecified dementia, unspecified severity, with other behavioral disturbance: Secondary | ICD-10-CM | POA: Diagnosis not present

## 2022-11-28 DIAGNOSIS — I10 Essential (primary) hypertension: Secondary | ICD-10-CM | POA: Diagnosis not present

## 2022-11-28 NOTE — Telephone Encounter (Signed)
Pt c/o medication issue:  1. Name of Medication: losartan (COZAAR) 25 MG tablet   2. How are you currently taking this medication (dosage and times per day)? Need to verify dose  3. Are you having a reaction (difficulty breathing--STAT)? no  4. What is your medication issue? Roselie Awkward, physical therapist with [facility] calling to verify dose of losartan. Phone: [phone]

## 2022-11-28 NOTE — Telephone Encounter (Signed)
Called and spoke with Roselie Awkward. Informed him the pt is to take Losartan 100 mg daily. Asked him to encourage pt's husband to record the blood pressures and heart rates so Dr. Ellyn Hack will have readings to review. He verbalized understanding.

## 2022-12-02 ENCOUNTER — Ambulatory Visit: Payer: Medicare Other | Admitting: Nurse Practitioner

## 2022-12-02 DIAGNOSIS — A0472 Enterocolitis due to Clostridium difficile, not specified as recurrent: Secondary | ICD-10-CM | POA: Diagnosis not present

## 2022-12-02 DIAGNOSIS — I10 Essential (primary) hypertension: Secondary | ICD-10-CM | POA: Diagnosis not present

## 2022-12-02 DIAGNOSIS — G479 Sleep disorder, unspecified: Secondary | ICD-10-CM | POA: Diagnosis not present

## 2022-12-02 DIAGNOSIS — F05 Delirium due to known physiological condition: Secondary | ICD-10-CM | POA: Diagnosis not present

## 2022-12-02 DIAGNOSIS — E43 Unspecified severe protein-calorie malnutrition: Secondary | ICD-10-CM | POA: Diagnosis not present

## 2022-12-02 DIAGNOSIS — M791 Myalgia, unspecified site: Secondary | ICD-10-CM | POA: Diagnosis not present

## 2022-12-02 DIAGNOSIS — Z8674 Personal history of sudden cardiac arrest: Secondary | ICD-10-CM | POA: Diagnosis not present

## 2022-12-02 DIAGNOSIS — I4901 Ventricular fibrillation: Secondary | ICD-10-CM | POA: Diagnosis not present

## 2022-12-02 DIAGNOSIS — E038 Other specified hypothyroidism: Secondary | ICD-10-CM | POA: Diagnosis not present

## 2022-12-02 DIAGNOSIS — Z7982 Long term (current) use of aspirin: Secondary | ICD-10-CM | POA: Diagnosis not present

## 2022-12-02 DIAGNOSIS — F03918 Unspecified dementia, unspecified severity, with other behavioral disturbance: Secondary | ICD-10-CM | POA: Diagnosis not present

## 2022-12-02 DIAGNOSIS — E538 Deficiency of other specified B group vitamins: Secondary | ICD-10-CM | POA: Diagnosis not present

## 2022-12-02 DIAGNOSIS — I4891 Unspecified atrial fibrillation: Secondary | ICD-10-CM | POA: Diagnosis not present

## 2022-12-02 DIAGNOSIS — E871 Hypo-osmolality and hyponatremia: Secondary | ICD-10-CM | POA: Diagnosis not present

## 2022-12-02 DIAGNOSIS — E559 Vitamin D deficiency, unspecified: Secondary | ICD-10-CM | POA: Diagnosis not present

## 2022-12-02 DIAGNOSIS — Z8673 Personal history of transient ischemic attack (TIA), and cerebral infarction without residual deficits: Secondary | ICD-10-CM | POA: Diagnosis not present

## 2022-12-04 DIAGNOSIS — H5340 Unspecified visual field defects: Secondary | ICD-10-CM | POA: Diagnosis not present

## 2022-12-05 DIAGNOSIS — I10 Essential (primary) hypertension: Secondary | ICD-10-CM | POA: Diagnosis not present

## 2022-12-05 DIAGNOSIS — E43 Unspecified severe protein-calorie malnutrition: Secondary | ICD-10-CM | POA: Diagnosis not present

## 2022-12-05 DIAGNOSIS — E559 Vitamin D deficiency, unspecified: Secondary | ICD-10-CM | POA: Diagnosis not present

## 2022-12-05 DIAGNOSIS — I4901 Ventricular fibrillation: Secondary | ICD-10-CM | POA: Diagnosis not present

## 2022-12-05 DIAGNOSIS — M791 Myalgia, unspecified site: Secondary | ICD-10-CM | POA: Diagnosis not present

## 2022-12-05 DIAGNOSIS — E038 Other specified hypothyroidism: Secondary | ICD-10-CM | POA: Diagnosis not present

## 2022-12-05 DIAGNOSIS — Z8674 Personal history of sudden cardiac arrest: Secondary | ICD-10-CM | POA: Diagnosis not present

## 2022-12-05 DIAGNOSIS — E871 Hypo-osmolality and hyponatremia: Secondary | ICD-10-CM | POA: Diagnosis not present

## 2022-12-05 DIAGNOSIS — Z7982 Long term (current) use of aspirin: Secondary | ICD-10-CM | POA: Diagnosis not present

## 2022-12-05 DIAGNOSIS — A0472 Enterocolitis due to Clostridium difficile, not specified as recurrent: Secondary | ICD-10-CM | POA: Diagnosis not present

## 2022-12-05 DIAGNOSIS — E538 Deficiency of other specified B group vitamins: Secondary | ICD-10-CM | POA: Diagnosis not present

## 2022-12-05 DIAGNOSIS — I4891 Unspecified atrial fibrillation: Secondary | ICD-10-CM | POA: Diagnosis not present

## 2022-12-05 DIAGNOSIS — F03918 Unspecified dementia, unspecified severity, with other behavioral disturbance: Secondary | ICD-10-CM | POA: Diagnosis not present

## 2022-12-05 DIAGNOSIS — G479 Sleep disorder, unspecified: Secondary | ICD-10-CM | POA: Diagnosis not present

## 2022-12-05 DIAGNOSIS — F05 Delirium due to known physiological condition: Secondary | ICD-10-CM | POA: Diagnosis not present

## 2022-12-05 DIAGNOSIS — Z8673 Personal history of transient ischemic attack (TIA), and cerebral infarction without residual deficits: Secondary | ICD-10-CM | POA: Diagnosis not present

## 2022-12-09 ENCOUNTER — Ambulatory Visit: Payer: Self-pay

## 2022-12-09 NOTE — Patient Outreach (Signed)
  Care Coordination   Follow Up Visit Note   12/09/2022 Name: Marissa [name] MRN: [id number] DOB: 12/31/1945  Marissa [name] is a 77 y.o. year old female who sees [name], MD for primary care. I spoke with Christean Grief Batra,dpr by phone today.  What matters to the patients health and wellness today?  Mr. Shumard reports Marissa [name] is doing well. He states they have hired and in [employer], television/film set through [employer] that Marissa [name] seems to enjoy the company. He also states patient is also active with home health PT. Mr. Rogness expressed the week respite where patient was with her daughter was beneficial for both himself and Marissa [name]. He denies any care coordination needs at this time. Mr. Foos to contact RNCM if care coordination needs in the future.  Goals Addressed             This Visit's Progress    COMPLETED: Care Coordinator Health management       Interventions Today    Flowsheet Row Most Recent Value  Chronic Disease Discussed/Reviewed   Chronic disease discussed/reviewed during today's visit Other  General Interventions   General Interventions Discussed/Reviewed General Interventions Reviewed, Community Resources  [reviewed available community resources such as [employer] memory solution patient's with memory issues and to increase socialization]  Mental Health Interventions   Mental Health Discussed/Reviewed Mental Health Discussed  [caregiver stress]     Care Coordination Interventions: Discussed counseling support re: caregiver stress. Confirmed Mr. Noguchi has initiated care with [facility] Mr. Burggraf to continue to communicate with providers with questions, concerns or worsening condition Encouraged to call [facility] for care coordination as needed. Reviewed care coordination program with Mr. Adler. RNCM confirmed he has contact number.       SDOH assessments and interventions completed:  No  Care Coordination Interventions:  Yes, provided    Follow up plan: No further intervention required.   Encounter Outcome:  Pt. Visit Completed   Thea Silversmith, RN, MSN, BSN, [place] Coordinator [phone]

## 2022-12-09 NOTE — Patient Instructions (Signed)
Visit Information  Thank you for taking time to visit with me today. Please don't hesitate to contact me if I can be of assistance to you.   Following are the goals we discussed today:   Goals Addressed             This Visit's Progress    COMPLETED: Care Coordinator Health management       Interventions Today    Flowsheet Row Most Recent Value  Chronic Disease Discussed/Reviewed   Chronic disease discussed/reviewed during today's visit Other  General Interventions   General Interventions Discussed/Reviewed General Interventions Reviewed, Community Resources  [reviewed available community resources such as [employer] memory solution patient's with memory issues and to increase socialization]  Mental Health Interventions   Mental Health Discussed/Reviewed Mental Health Discussed  [caregiver stress]     Care Coordination Interventions: Discussed counseling support re: caregiver stress. Confirmed Mr. Macchione has initiated care with [facility] Mr. Dunson to continue to communicate with providers with questions, concerns or worsening condition Encouraged to call [facility] for care coordination as needed. Reviewed care coordination program with Mr. Kelting. RNCM confirmed he has contact number.       If you are experiencing a Mental Health or [place] or need someone to talk to, please call the Suicide and Crisis Lifeline: [place], RN, MSN, BSN, [place] [phone]

## 2022-12-10 ENCOUNTER — Other Ambulatory Visit: Payer: Self-pay | Admitting: Thoracic Surgery (Cardiothoracic Vascular Surgery)

## 2022-12-10 DIAGNOSIS — G479 Sleep disorder, unspecified: Secondary | ICD-10-CM | POA: Diagnosis not present

## 2022-12-10 DIAGNOSIS — R911 Solitary pulmonary nodule: Secondary | ICD-10-CM

## 2022-12-10 DIAGNOSIS — F03918 Unspecified dementia, unspecified severity, with other behavioral disturbance: Secondary | ICD-10-CM | POA: Diagnosis not present

## 2022-12-10 DIAGNOSIS — E559 Vitamin D deficiency, unspecified: Secondary | ICD-10-CM | POA: Diagnosis not present

## 2022-12-10 DIAGNOSIS — Z7982 Long term (current) use of aspirin: Secondary | ICD-10-CM | POA: Diagnosis not present

## 2022-12-10 DIAGNOSIS — E038 Other specified hypothyroidism: Secondary | ICD-10-CM | POA: Diagnosis not present

## 2022-12-10 DIAGNOSIS — E538 Deficiency of other specified B group vitamins: Secondary | ICD-10-CM | POA: Diagnosis not present

## 2022-12-10 DIAGNOSIS — A0472 Enterocolitis due to Clostridium difficile, not specified as recurrent: Secondary | ICD-10-CM | POA: Diagnosis not present

## 2022-12-10 DIAGNOSIS — I4901 Ventricular fibrillation: Secondary | ICD-10-CM | POA: Diagnosis not present

## 2022-12-10 DIAGNOSIS — Z8674 Personal history of sudden cardiac arrest: Secondary | ICD-10-CM | POA: Diagnosis not present

## 2022-12-10 DIAGNOSIS — M791 Myalgia, unspecified site: Secondary | ICD-10-CM | POA: Diagnosis not present

## 2022-12-10 DIAGNOSIS — F05 Delirium due to known physiological condition: Secondary | ICD-10-CM | POA: Diagnosis not present

## 2022-12-10 DIAGNOSIS — I4891 Unspecified atrial fibrillation: Secondary | ICD-10-CM | POA: Diagnosis not present

## 2022-12-10 DIAGNOSIS — E871 Hypo-osmolality and hyponatremia: Secondary | ICD-10-CM | POA: Diagnosis not present

## 2022-12-10 DIAGNOSIS — Z8673 Personal history of transient ischemic attack (TIA), and cerebral infarction without residual deficits: Secondary | ICD-10-CM | POA: Diagnosis not present

## 2022-12-10 DIAGNOSIS — E43 Unspecified severe protein-calorie malnutrition: Secondary | ICD-10-CM | POA: Diagnosis not present

## 2022-12-10 DIAGNOSIS — I10 Essential (primary) hypertension: Secondary | ICD-10-CM | POA: Diagnosis not present

## 2022-12-15 ENCOUNTER — Other Ambulatory Visit: Payer: Self-pay | Admitting: Internal Medicine

## 2022-12-16 ENCOUNTER — Telehealth: Payer: Self-pay | Admitting: Internal Medicine

## 2022-12-16 DIAGNOSIS — Z9181 History of falling: Secondary | ICD-10-CM

## 2022-12-16 DIAGNOSIS — F05 Delirium due to known physiological condition: Secondary | ICD-10-CM | POA: Diagnosis not present

## 2022-12-16 DIAGNOSIS — E43 Unspecified severe protein-calorie malnutrition: Secondary | ICD-10-CM | POA: Diagnosis not present

## 2022-12-16 DIAGNOSIS — E559 Vitamin D deficiency, unspecified: Secondary | ICD-10-CM | POA: Diagnosis not present

## 2022-12-16 DIAGNOSIS — Z7982 Long term (current) use of aspirin: Secondary | ICD-10-CM | POA: Diagnosis not present

## 2022-12-16 DIAGNOSIS — E538 Deficiency of other specified B group vitamins: Secondary | ICD-10-CM | POA: Diagnosis not present

## 2022-12-16 DIAGNOSIS — A0472 Enterocolitis due to Clostridium difficile, not specified as recurrent: Secondary | ICD-10-CM | POA: Diagnosis not present

## 2022-12-16 DIAGNOSIS — E038 Other specified hypothyroidism: Secondary | ICD-10-CM | POA: Diagnosis not present

## 2022-12-16 DIAGNOSIS — I4891 Unspecified atrial fibrillation: Secondary | ICD-10-CM | POA: Diagnosis not present

## 2022-12-16 DIAGNOSIS — G479 Sleep disorder, unspecified: Secondary | ICD-10-CM | POA: Diagnosis not present

## 2022-12-16 DIAGNOSIS — E871 Hypo-osmolality and hyponatremia: Secondary | ICD-10-CM | POA: Diagnosis not present

## 2022-12-16 DIAGNOSIS — I10 Essential (primary) hypertension: Secondary | ICD-10-CM | POA: Diagnosis not present

## 2022-12-16 DIAGNOSIS — M791 Myalgia, unspecified site: Secondary | ICD-10-CM | POA: Diagnosis not present

## 2022-12-16 DIAGNOSIS — Z8674 Personal history of sudden cardiac arrest: Secondary | ICD-10-CM | POA: Diagnosis not present

## 2022-12-16 DIAGNOSIS — F03918 Unspecified dementia, unspecified severity, with other behavioral disturbance: Secondary | ICD-10-CM | POA: Diagnosis not present

## 2022-12-16 DIAGNOSIS — Z8673 Personal history of transient ischemic attack (TIA), and cerebral infarction without residual deficits: Secondary | ICD-10-CM | POA: Diagnosis not present

## 2022-12-16 DIAGNOSIS — I4901 Ventricular fibrillation: Secondary | ICD-10-CM | POA: Diagnosis not present

## 2022-12-16 NOTE — Telephone Encounter (Signed)
Physical Therapist from [facility] called stating that they just want Dr. [name] to know that the patient's blood has been fluctuating for the past month. Before her PT appt today the patient's bp was 160/80. After her PT appt her bp was 190/100. Physical Therapist did advise the patient to follow up with her Cardiologist. Shawnie Dapper number for [id number] Physical Therapist is [phone].

## 2022-12-17 ENCOUNTER — Encounter: Payer: Self-pay | Admitting: Cardiology

## 2022-12-17 MED ORDER — CLONIDINE HCL 0.2 MG PO TABS: ORAL_TABLET | ORAL | 6 refills | Status: DC

## 2022-12-19 ENCOUNTER — Telehealth: Payer: Self-pay | Admitting: Cardiology

## 2022-12-19 DIAGNOSIS — I4891 Unspecified atrial fibrillation: Secondary | ICD-10-CM | POA: Diagnosis not present

## 2022-12-19 DIAGNOSIS — E038 Other specified hypothyroidism: Secondary | ICD-10-CM | POA: Diagnosis not present

## 2022-12-19 DIAGNOSIS — M791 Myalgia, unspecified site: Secondary | ICD-10-CM | POA: Diagnosis not present

## 2022-12-19 DIAGNOSIS — E871 Hypo-osmolality and hyponatremia: Secondary | ICD-10-CM | POA: Diagnosis not present

## 2022-12-19 DIAGNOSIS — F03918 Unspecified dementia, unspecified severity, with other behavioral disturbance: Secondary | ICD-10-CM | POA: Diagnosis not present

## 2022-12-19 DIAGNOSIS — I10 Essential (primary) hypertension: Secondary | ICD-10-CM | POA: Diagnosis not present

## 2022-12-19 DIAGNOSIS — E538 Deficiency of other specified B group vitamins: Secondary | ICD-10-CM | POA: Diagnosis not present

## 2022-12-19 DIAGNOSIS — E43 Unspecified severe protein-calorie malnutrition: Secondary | ICD-10-CM | POA: Diagnosis not present

## 2022-12-19 DIAGNOSIS — Z8674 Personal history of sudden cardiac arrest: Secondary | ICD-10-CM | POA: Diagnosis not present

## 2022-12-19 DIAGNOSIS — F05 Delirium due to known physiological condition: Secondary | ICD-10-CM | POA: Diagnosis not present

## 2022-12-19 DIAGNOSIS — I4901 Ventricular fibrillation: Secondary | ICD-10-CM | POA: Diagnosis not present

## 2022-12-19 DIAGNOSIS — E559 Vitamin D deficiency, unspecified: Secondary | ICD-10-CM | POA: Diagnosis not present

## 2022-12-19 DIAGNOSIS — Z8673 Personal history of transient ischemic attack (TIA), and cerebral infarction without residual deficits: Secondary | ICD-10-CM | POA: Diagnosis not present

## 2022-12-19 DIAGNOSIS — A0472 Enterocolitis due to Clostridium difficile, not specified as recurrent: Secondary | ICD-10-CM | POA: Diagnosis not present

## 2022-12-19 DIAGNOSIS — G479 Sleep disorder, unspecified: Secondary | ICD-10-CM | POA: Diagnosis not present

## 2022-12-19 DIAGNOSIS — Z7982 Long term (current) use of aspirin: Secondary | ICD-10-CM | POA: Diagnosis not present

## 2022-12-19 MED ORDER — LOSARTAN POTASSIUM 100 MG PO TABS: 100.0000 mg | ORAL_TABLET | Freq: Every day | ORAL | 3 refills | Status: DC

## 2022-12-19 NOTE — Telephone Encounter (Signed)
In the past we had used clonidine for symptomatic blood pressures greater than 160 mmHg.  If she is not having a headache or dizzy or woozy, and the blood pressure is elevated, I would just recheck it in an hour or so.  She is very labile with her pressures.  If still elevated, then would use the PRN dose of clonidine.  Blood pressure is better in the 160 to 180 mmHg range or not all that concerning if they are transient.  Even sustained, is not overly worrisome given her lack of symptoms.  At this point I would probably try to err on the side of may be not checking blood pressures unless she is having symptoms.  If her systolic blood pressures are in the 130-160 range, I would prefer not to change any standing medications.  Using intermittent clonidine for high blood pressure readings as is reasonable, but when I look at the blood pressure is listed, none are overly worrisome based on the fact that they go up and down.  Focus more on symptoms, less on numbers.   [name], MD

## 2022-12-19 NOTE — Telephone Encounter (Signed)
Pt c/o BP issue: STAT if pt c/o blurred vision, one-sided weakness or slurred speech   1. What are your last 5 BP readings?    160/100 - 2/19 182/105 - 2/18 130/88 - 2/20 153/108 - 2/22 174/84 - 2/22  (1:00 pm) 156/92 - 2/22 (while on phone)   2. Are you having any other symptoms (ex. Dizziness, headache, blurred vision, passed out)?   Headache which comes and goes   3. What is your BP issue?     Caller wanted to report patient's BP is still not controlled and BP has been running 160-200 over 80-100.  Caller noted this BP issue has been going on for several months.  Caller stated can contact the patient or husband directly.      Called pt's husband- Dr Ellyn Hack changed how she "takes" the medication the last visit. So he now gives meds all at same time. Over the last week, has been giving meds around 5-6pm (provider said to take at 8pm) He said it did not make a difference taking it at 8pm. Reports that she does not have any other symptoms- only a headache that comes and goes. Her BP is not well controlled and he is concerned.  Pt currently takes Nebivolol 52m and Losartan 1080mevery evening. (Losartan increased to 10014mn 11/28/22- just updated in chart) (has Metoprolol tartrate prn for heart rate above 100, has not given because heart rate has been fine)  Told that I would send this information to the provider and would get back to him with recommendations. Given ER precautions.   F/U appt scheduled 03/03/23, wondering if she needs to be seen earlier?

## 2022-12-19 NOTE — Telephone Encounter (Signed)
Called pt's husband and gave the information that Dr Ellyn Hack sent to me, as follows  In the past we had used clonidine for symptomatic blood pressures greater than 160 mmHg.   If she is not having a headache or dizzy or woozy, and the blood pressure is elevated, I would just recheck it in an hour or so.  She is very labile with her pressures.  If still elevated, then would use the PRN dose of clonidine.   Blood pressure is better in the 160 to 180 mmHg range or not all that concerning if they are transient.  Even sustained, is not overly worrisome given her lack of symptoms.   At this point I would probably try to err on the side of may be not checking blood pressures unless she is having symptoms.  If her systolic blood pressures are in the 130-160 range, I would prefer not to change any standing medications.  Using intermittent clonidine for high blood pressure readings as is reasonable, but when I look at the blood pressure is listed, none are overly worrisome based on the fact that they go up and down.   Focus more on symptoms, less on numbers.     [name], MD     He verbalized understanding of instructions. Given ER precautions and told to call with any questions or concerns

## 2022-12-19 NOTE — Telephone Encounter (Signed)
Pt c/o BP issue: STAT if pt c/o blurred vision, one-sided weakness or slurred speech  1. What are your last 5 BP readings?   160/100 - 2/19 182/105 - 2/18 130/88 - 2/20 153/108 - 2/22 174/84 - 2/22  (1:00 pm) 156/92 - 2/22 (while on phone)  2. Are you having any other symptoms (ex. Dizziness, headache, blurred vision, passed out)?   Headache which comes and goes  3. What is your BP issue?    Caller wanted to report patient's BP is still not controlled and BP has been running 160-200 over 80-100.  Caller noted this BP issue has been going on for several months.  Caller stated can contact the patient or husband directly.

## 2022-12-20 ENCOUNTER — Ambulatory Visit: Payer: Medicare Other | Admitting: Nurse Practitioner

## 2022-12-23 ENCOUNTER — Telehealth: Payer: Self-pay | Admitting: Internal Medicine

## 2022-12-23 ENCOUNTER — Encounter: Payer: Self-pay | Admitting: Thoracic Surgery (Cardiothoracic Vascular Surgery)

## 2022-12-23 DIAGNOSIS — E038 Other specified hypothyroidism: Secondary | ICD-10-CM | POA: Diagnosis not present

## 2022-12-23 DIAGNOSIS — F03918 Unspecified dementia, unspecified severity, with other behavioral disturbance: Secondary | ICD-10-CM | POA: Diagnosis not present

## 2022-12-23 DIAGNOSIS — I10 Essential (primary) hypertension: Secondary | ICD-10-CM | POA: Diagnosis not present

## 2022-12-23 DIAGNOSIS — F05 Delirium due to known physiological condition: Secondary | ICD-10-CM | POA: Diagnosis not present

## 2022-12-23 DIAGNOSIS — E538 Deficiency of other specified B group vitamins: Secondary | ICD-10-CM | POA: Diagnosis not present

## 2022-12-23 DIAGNOSIS — G479 Sleep disorder, unspecified: Secondary | ICD-10-CM | POA: Diagnosis not present

## 2022-12-23 DIAGNOSIS — Z8674 Personal history of sudden cardiac arrest: Secondary | ICD-10-CM | POA: Diagnosis not present

## 2022-12-23 DIAGNOSIS — Z8673 Personal history of transient ischemic attack (TIA), and cerebral infarction without residual deficits: Secondary | ICD-10-CM | POA: Diagnosis not present

## 2022-12-23 DIAGNOSIS — E559 Vitamin D deficiency, unspecified: Secondary | ICD-10-CM | POA: Diagnosis not present

## 2022-12-23 DIAGNOSIS — E871 Hypo-osmolality and hyponatremia: Secondary | ICD-10-CM | POA: Diagnosis not present

## 2022-12-23 DIAGNOSIS — M791 Myalgia, unspecified site: Secondary | ICD-10-CM | POA: Diagnosis not present

## 2022-12-23 DIAGNOSIS — Z7982 Long term (current) use of aspirin: Secondary | ICD-10-CM | POA: Diagnosis not present

## 2022-12-23 DIAGNOSIS — I4901 Ventricular fibrillation: Secondary | ICD-10-CM | POA: Diagnosis not present

## 2022-12-23 DIAGNOSIS — E43 Unspecified severe protein-calorie malnutrition: Secondary | ICD-10-CM | POA: Diagnosis not present

## 2022-12-23 DIAGNOSIS — Z9181 History of falling: Secondary | ICD-10-CM | POA: Diagnosis not present

## 2022-12-23 DIAGNOSIS — I4891 Unspecified atrial fibrillation: Secondary | ICD-10-CM | POA: Diagnosis not present

## 2022-12-23 DIAGNOSIS — A0472 Enterocolitis due to Clostridium difficile, not specified as recurrent: Secondary | ICD-10-CM | POA: Diagnosis not present

## 2022-12-23 NOTE — Telephone Encounter (Signed)
Roselie Awkward from [facility] called wanting to let Dr. [name] know that this patient fell last Thursday and patient could not remember what exactly happened. Patient does have a bruise on her left hip and arm. She did hit her head. Roselie Awkward stated if there is further questions or concerns that he can be reached at [phone].

## 2022-12-24 NOTE — Telephone Encounter (Signed)
Noted  

## 2022-12-26 DIAGNOSIS — A0472 Enterocolitis due to Clostridium difficile, not specified as recurrent: Secondary | ICD-10-CM | POA: Diagnosis not present

## 2022-12-26 DIAGNOSIS — E538 Deficiency of other specified B group vitamins: Secondary | ICD-10-CM | POA: Diagnosis not present

## 2022-12-26 DIAGNOSIS — Z7982 Long term (current) use of aspirin: Secondary | ICD-10-CM | POA: Diagnosis not present

## 2022-12-26 DIAGNOSIS — E871 Hypo-osmolality and hyponatremia: Secondary | ICD-10-CM | POA: Diagnosis not present

## 2022-12-26 DIAGNOSIS — E038 Other specified hypothyroidism: Secondary | ICD-10-CM | POA: Diagnosis not present

## 2022-12-26 DIAGNOSIS — M791 Myalgia, unspecified site: Secondary | ICD-10-CM | POA: Diagnosis not present

## 2022-12-26 DIAGNOSIS — Z8673 Personal history of transient ischemic attack (TIA), and cerebral infarction without residual deficits: Secondary | ICD-10-CM | POA: Diagnosis not present

## 2022-12-26 DIAGNOSIS — G479 Sleep disorder, unspecified: Secondary | ICD-10-CM | POA: Diagnosis not present

## 2022-12-26 DIAGNOSIS — Z8674 Personal history of sudden cardiac arrest: Secondary | ICD-10-CM | POA: Diagnosis not present

## 2022-12-26 DIAGNOSIS — E559 Vitamin D deficiency, unspecified: Secondary | ICD-10-CM | POA: Diagnosis not present

## 2022-12-26 DIAGNOSIS — F03918 Unspecified dementia, unspecified severity, with other behavioral disturbance: Secondary | ICD-10-CM | POA: Diagnosis not present

## 2022-12-26 DIAGNOSIS — I4891 Unspecified atrial fibrillation: Secondary | ICD-10-CM | POA: Diagnosis not present

## 2022-12-26 DIAGNOSIS — E43 Unspecified severe protein-calorie malnutrition: Secondary | ICD-10-CM | POA: Diagnosis not present

## 2022-12-26 DIAGNOSIS — F05 Delirium due to known physiological condition: Secondary | ICD-10-CM | POA: Diagnosis not present

## 2022-12-26 DIAGNOSIS — I4901 Ventricular fibrillation: Secondary | ICD-10-CM | POA: Diagnosis not present

## 2022-12-26 DIAGNOSIS — I10 Essential (primary) hypertension: Secondary | ICD-10-CM | POA: Diagnosis not present

## 2022-12-30 ENCOUNTER — Ambulatory Visit
Admission: RE | Admit: 2022-12-30 | Discharge: 2022-12-30 | Disposition: A | Discharge status: Home / Self Care | Payer: Medicare Other | Source: Ambulatory Visit | Attending: Thoracic Surgery (Cardiothoracic Vascular Surgery) | Admitting: Thoracic Surgery (Cardiothoracic Vascular Surgery)

## 2022-12-30 DIAGNOSIS — R911 Solitary pulmonary nodule: Secondary | ICD-10-CM | POA: Diagnosis not present

## 2022-12-30 DIAGNOSIS — I700 Atherosclerosis of aorta: Secondary | ICD-10-CM | POA: Diagnosis not present

## 2022-12-31 ENCOUNTER — Ambulatory Visit: Payer: Medicare Other | Admitting: Thoracic Surgery (Cardiothoracic Vascular Surgery)

## 2022-12-31 ENCOUNTER — Ambulatory Visit: Payer: Medicare Other | Admitting: Physician Assistant

## 2022-12-31 ENCOUNTER — Encounter: Payer: Self-pay | Admitting: Internal Medicine

## 2022-12-31 VITALS — BP 150/100 | HR 72 | Resp 20 | Ht 64.0 in | Wt 126.0 lb

## 2022-12-31 DIAGNOSIS — R911 Solitary pulmonary nodule: Secondary | ICD-10-CM

## 2022-12-31 NOTE — H&P (View-Only) (Signed)
    [address]       [place] [address]             [phone]     HPI: Marissa [name] returns for follow-up regarding her right upper lobe groundglass opacity.  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 difficile colitis, chronic pain, atrial fibrillation, V-fib arrest, strokes, and dementia.  I have been following her for an ascending aneurysm since 2012.  In 2022 she was seen to have an unusual groundglass opacity in the right upper lobe centrally.  Follow-up scan that area had increased in size although it still remained groundglass with no solid component.  I recommended navigational bronchoscopy and biopsy.  She initially agreed to that but canceled due to ongoing problems with C. difficile colitis.  She now returns for a follow-up.  She has not had any new respiratory issues.  She did have a fall recently.  Past Medical History:  Diagnosis Date   Arthritis    Asthma    Bicuspid aortic valve 05/2017   Likely functional bicuspid aortic valve with sclerosis and no stenosis.   Bursitis    Cataract    mild   Dysthymia    Hemorrhoids    Hx of ulcerative colitis    per dr jenkins as per pt.   Hyperlipidemia    Labile hypertension    managed - labile.   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 Joana Nolton)   Thyroid disease     Current Outpatient Medications  Medication Sig Dispense Refill   acetaminophen (TYLENOL) 325 MG tablet Take 325 mg by mouth every 6 (six) hours as needed for moderate pain.     albuterol (VENTOLIN HFA) 108  (90 Base) MCG/ACT inhaler INHALE 2 PUFFS INTO THE LUNGS EVERY 6 HOURS AS NEEDED FOR WHEEZING OR SHORTNESS OF BREATH 6.7 g 2   ALPRAZolam (XANAX) 0.5 MG tablet Take 1 tablet (0.5 mg total) by mouth 2 (two) times daily as needed for anxiety. 60 tablet 3   cloNIDine (CATAPRES) 0.2 MG tablet Take 0.1 mg by mouth 2 (two) times daily as needed (systolic over 160). if blood pressure is great than 160 systolic for more than an hour 30 tablet 6   Eszopiclone 3 MG TABS Take 1 tablet (3 mg total) by mouth at bedtime. 90 tablet 1   feeding supplement, GLUCERNA SHAKE, (GLUCERNA SHAKE) LIQD Take 237 mLs by mouth daily as needed (if not eating meals).     FLUoxetine (PROZAC) 20 MG tablet Take 1 tablet (20 mg total) by mouth daily. (Patient taking differently: Take 10 mg by mouth daily.) 90 tablet 1   HYDROcodone-acetaminophen (NORCO/VICODIN) 5-325 MG tablet Take 1 tablet by mouth daily as needed for moderate pain. 15 tablet 0   latanoprost (XALATAN) 0.005 % ophthalmic solution Place 1 drop into both eyes at bedtime.     levothyroxine (SYNTHROID) 125 MCG tablet TAKE 1 TABLET(125 MCG) BY MOUTH DAILY 90 tablet 1   losartan (COZAAR) 100 MG tablet Take 1 tablet (100 mg   total) by mouth daily. 90 tablet 3   magnesium oxide (MAG-OX) 400 MG tablet Take 1 tablet (400 mg total) by mouth 2 (two) times daily. 60 tablet 0   metoprolol tartrate (LOPRESSOR) 25 MG tablet Take 25 mg tablet as needed if heart rate is above 100 after waiting for 10 minutes , if systolic blood pressure is less than 110 do not take metoprolol. 30 tablet 6   Multiple Vitamin (MULTIVITAMIN WITH MINERALS) TABS tablet Take 1 tablet by mouth daily.     nebivolol (BYSTOLIC) 10 MG tablet TAKE 1 TABLET(10 MG) BY MOUTH DAILY AT 8 PM 90 tablet 3   Respiratory Therapy Supplies (FLUTTER) DEVI Use as directed 1 each 0   triamcinolone cream (KENALOG) 0.1 % Apply 1 Application topically 2 (two) times daily. (Patient taking differently: Apply 1 Application topically 2  (two) times daily as needed (irritation).) 100 g 0   No current facility-administered medications for this visit.    Physical Exam BP (!) 150/100   Pulse 72   Resp 20   Ht 5' 4" (1.626 m)   Wt 126 lb (57.2 kg)   SpO2 95% Comment: RA  BMI 21.63 kg/m  77-year-old woman in no acute distress Alert and oriented x 3 with no focal motor deficit No cervical or supraclavicular adenopathy Lungs clear bilaterally Cardiac regular rate and rhythm with a 2/6 systolic murmur Multiple ecchymoses No peripheral edema  Diagnostic Tests: CT CHEST WITHOUT CONTRAST   TECHNIQUE: Multidetector CT imaging of the chest was performed using thin slice collimation for electromagnetic bronchoscopy planning purposes, without intravenous contrast.   RADIATION DOSE REDUCTION: This exam was performed according to the departmental dose-optimization program which includes automated exposure control, adjustment of the mA and/or kV according to patient size and/or use of iterative reconstruction technique.   COMPARISON:  Multiple priors, most recent chest CT dated September 18, 2022   FINDINGS: Cardiovascular: Normal heart size. No pericardial effusion. Dilated ascending thoracic aorta measuring up to 4.3 cm, unchanged when compared with the prior exam. Moderate atherosclerotic disease of the thoracic aorta. Aortic valve calcifications. Severe coronary artery calcifications of the LAD.   Mediastinum/Nodes: Small hiatal hernia. No pathologically enlarged lymph nodes seen in the chest.   Lungs/Pleura: Central airways are patent. Large irregular ground-glass nodule of the right upper lobe which partially extends into the superior portion of the right lower lobe measuring 4.4 x 4.3 x 4.6 cm on series 3, image 41, unchanged. No pleural effusion or pneumothorax.   Upper Abdomen: No acute abnormality.   Musculoskeletal: No chest wall mass or suspicious bone lesions identified.   IMPRESSION: 1. Large  irregular ground-glass nodule of the right upper lobe which partially extends into the superior portion of the right lower lobe, unchanged in size when compared with most recent prior exam, but increased in size when compared with more remote priors. 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 2. Aortic valve calcifications, findings can be seen in the setting of aortic stenosis or sclerosis. Correlate with echocardiography. 3. Dilated ascending thoracic aorta measuring up to 4.3 cm, unchanged when compared with the prior exam. Recommend annual imaging followup by CTA or MRA. This recommendation follows 2010 ACCF/AHA/AATS/ACR/ASA/SCA/SCAI/SIR/STS/SVM Guidelines for the Diagnosis and Management of Patients with Thoracic Aortic Disease. Circulation. 2010; 121: E266-e369. Aortic aneurysm NOS (ICD10-I71.9) 4. Coronary artery calcifications and aortic Atherosclerosis (ICD10-I70.0).     Electronically Signed   By: Leah  Strickland M.D.   On: 12/30/2022 16:11   I personally reviewed   the CT images.  No change in size of groundglass opacity centrally in right upper lobe.  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  Coronary and aortic atherosclerosis.  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 difficile colitis, chronic pain, atrial fibrillation, V-fib arrest, strokes, and dementia.  Groundglass opacity right upper lobe-CT today shows stable size compared to her CT 3 months ago.  Had increased in size prior to that scan.  I still think the next step would be a navigational bronchoscopy for biopsy.  I discussed the proposed operative procedure with Mrs. Diantonio and her husband.  They understand the procedure would be done in the operating room under general anesthesia.  It would not require incisions.   We would plan to do it on an outpatient basis.  They understand it is diagnostic and not therapeutic.  I informed him of the indications, risks, benefits, and alternatives.  They understand the risks include failure to make a diagnosis, pneumothorax, and bleeding as well as general risks associated with general anesthesia such as stroke, MI, blood clots, as well as possibility of other unforeseeable complications.  They accept the risk and agreed to proceed.  Plan: Electromagnetic navigational bronchoscopy to biopsy right upper lobe groundglass opacity. Office will call patient to schedule.  Kandi Brusseau C Oday Ridings, MD [facility] [phone]     

## 2022-12-31 NOTE — Progress Notes (Signed)
[address]       [place] [address]             [phone]     HPI: [name] returns for follow-up regarding her right upper lobe groundglass opacity.  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 difficile colitis, chronic pain, atrial fibrillation, V-fib arrest, strokes, and dementia.  I have been following her for an ascending aneurysm since 2012.  In 2022 she was seen to have an unusual groundglass opacity in the right upper lobe centrally.  Follow-up scan that area had increased in size although it still remained groundglass with no solid component.  I recommended navigational bronchoscopy and biopsy.  She initially agreed to that but canceled due to ongoing problems with C. difficile colitis.  She now returns for a follow-up.  She has not had any new respiratory issues.  She did have a fall recently.  Past Medical History:  Diagnosis Date   Arthritis    Asthma    Bicuspid aortic valve 05/2017   Likely functional bicuspid aortic valve with sclerosis and no stenosis.   Bursitis    Cataract    mild   Dysthymia    Hemorrhoids    Hx of ulcerative colitis    per dr Arnoldo Morale as per pt.   Hyperlipidemia    Labile hypertension    managed - labile.   Migraine    Scoliosis    Slurred speech    temporal lobe area that is not a tumor causes occ slurred speech and inability to communicate/ words will not come out at the correct time   Spinal stenosis    Stroke ([facility]) 08/06/2021   Thoracic aortic aneurysm (HCC)    Stable 4.2-4.3 cm (followed by Dr. Roxan Hockey)   Thyroid disease     Current Outpatient Medications  Medication Sig Dispense Refill   acetaminophen (TYLENOL) 325 MG tablet Take 325 mg by mouth every 6 (six) hours as needed for moderate pain.     albuterol (VENTOLIN HFA) 108  (90 Base) MCG/ACT inhaler INHALE 2 PUFFS INTO THE LUNGS EVERY 6 HOURS AS NEEDED FOR WHEEZING OR SHORTNESS OF BREATH 6.7 g 2   ALPRAZolam (XANAX) 0.5 MG tablet Take 1 tablet (0.5 mg total) by mouth 2 (two) times daily as needed for anxiety. 60 tablet 3   cloNIDine (CATAPRES) 0.2 MG tablet Take 0.1 mg by mouth 2 (two) times daily as needed (systolic over 0000000). if blood pressure is great than 0000000 systolic for more than an hour 30 tablet 6   Eszopiclone 3 MG TABS Take 1 tablet (3 mg total) by mouth at bedtime. 90 tablet 1   feeding supplement, GLUCERNA SHAKE, (GLUCERNA SHAKE) LIQD Take 237 mLs by mouth daily as needed (if not eating meals).     FLUoxetine (PROZAC) 20 MG tablet Take 1 tablet (20 mg total) by mouth daily. (Patient taking differently: Take 10 mg by mouth daily.) 90 tablet 1   HYDROcodone-acetaminophen (NORCO/VICODIN) 5-325 MG tablet Take 1 tablet by mouth daily as needed for moderate pain. 15 tablet 0   latanoprost (XALATAN) 0.005 % ophthalmic solution Place 1 drop into both eyes at bedtime.     levothyroxine (SYNTHROID) 125 MCG tablet TAKE 1 TABLET(125 MCG) BY MOUTH DAILY 90 tablet 1   losartan (COZAAR) 100 MG tablet Take 1 tablet (100 mg  total) by mouth daily. 90 tablet 3   magnesium oxide (MAG-OX) 400 MG tablet Take 1 tablet (400 mg total) by mouth 2 (two) times daily. 60 tablet 0   metoprolol tartrate (LOPRESSOR) 25 MG tablet Take 25 mg tablet as needed if heart rate is above 100 after waiting for 10 minutes , if systolic blood pressure is less than 110 do not take metoprolol. 30 tablet 6   Multiple Vitamin (MULTIVITAMIN WITH MINERALS) TABS tablet Take 1 tablet by mouth daily.     nebivolol (BYSTOLIC) 10 MG tablet TAKE 1 TABLET(10 MG) BY MOUTH DAILY AT 8 PM 90 tablet 3   Respiratory Therapy Supplies (FLUTTER) DEVI Use as directed 1 each 0   triamcinolone cream (KENALOG) 0.1 % Apply 1 Application topically 2 (two) times daily. (Patient taking differently: Apply 1 Application topically 2  (two) times daily as needed (irritation).) 100 g 0   No current facility-administered medications for this visit.    Physical Exam BP (!) 150/100   Pulse 72   Resp 20   Ht '5\' 4"'$  (1.626 m)   Wt 126 lb (57.2 kg)   SpO2 95% Comment: RA  BMI 21.17 kg/m  77 year old woman in no acute distress Alert and oriented x 3 with no focal motor deficit No cervical or supraclavicular adenopathy Lungs clear bilaterally Cardiac regular rate and rhythm with a 2/6 systolic murmur Multiple ecchymoses No peripheral edema  Diagnostic Tests: CT CHEST WITHOUT CONTRAST   TECHNIQUE: Multidetector CT imaging of the chest was performed using thin slice collimation for electromagnetic bronchoscopy planning purposes, without intravenous contrast.   RADIATION DOSE REDUCTION: This exam was performed according to the departmental dose-optimization program which includes automated exposure control, adjustment of the mA and/or kV according to patient size and/or use of iterative reconstruction technique.   COMPARISON:  Multiple priors, most recent chest CT dated September 18, 2022   FINDINGS: Cardiovascular: Normal heart size. No pericardial effusion. Dilated ascending thoracic aorta measuring up to 4.3 cm, unchanged when compared with the prior exam. Moderate atherosclerotic disease of the thoracic aorta. Aortic valve calcifications. Severe coronary artery calcifications of the LAD.   Mediastinum/Nodes: Small hiatal hernia. No pathologically enlarged lymph nodes seen in the chest.   Lungs/Pleura: Central airways are patent. Large irregular ground-glass nodule of the right upper lobe which partially extends into the superior portion of the right lower lobe measuring 4.4 x 4.3 x 4.6 cm on series 3, image 41, unchanged. No pleural effusion or pneumothorax.   Upper Abdomen: No acute abnormality.   Musculoskeletal: No chest wall mass or suspicious bone lesions identified.   IMPRESSION: 1. Large  irregular ground-glass nodule of the right upper lobe which partially extends into the superior portion of the right lower lobe, unchanged in size when compared with most recent prior exam, but increased in size when compared with more remote priors. 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 2. Aortic valve calcifications, findings can be seen in the setting of aortic stenosis or sclerosis. Correlate with echocardiography. 3. Dilated ascending thoracic aorta measuring up to 4.3 cm, unchanged when compared with the prior exam. Recommend annual imaging followup by CTA or MRA. This recommendation follows 2010 ACCF/AHA/AATS/ACR/ASA/SCA/SCAI/SIR/STS/SVM Guidelines for the Diagnosis and Management of Patients with Thoracic Aortic Disease. Circulation. 2010; [id number]. Aortic aneurysm NOS (ICD10-I71.9) 4. Coronary artery calcifications and aortic Atherosclerosis (ICD10-I70.0).     Electronically Signed   By: Yetta Glassman M.D.   On: 12/30/2022 16:11   I personally reviewed  the CT images.  No change in size of groundglass opacity centrally in right upper lobe.  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  Coronary and aortic atherosclerosis.  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 difficile colitis, chronic pain, atrial fibrillation, V-fib arrest, strokes, and dementia.  Groundglass opacity right upper lobe-CT today shows stable size compared to her CT 3 months ago.  Had increased in size prior to that scan.  I still think the next step would be a navigational bronchoscopy for biopsy.  I discussed the proposed operative procedure with Mrs. Durfey and her husband.  They understand the procedure would be done in the operating room under general anesthesia.  It would not require incisions.   We would plan to do it on an outpatient basis.  They understand it is diagnostic and not therapeutic.  I informed him of the indications, risks, benefits, and alternatives.  They understand the risks include failure to make a diagnosis, pneumothorax, and bleeding as well as general risks associated with general anesthesia such as stroke, MI, blood clots, as well as possibility of other unforeseeable complications.  They accept the risk and agreed to proceed.  Plan: Electromagnetic navigational bronchoscopy to biopsy right upper lobe groundglass opacity. Office will call patient to schedule.  Melrose Nakayama, MD [facility] [phone]

## 2023-01-01 ENCOUNTER — Other Ambulatory Visit: Payer: Self-pay | Admitting: *Deleted

## 2023-01-01 ENCOUNTER — Encounter: Payer: Self-pay | Admitting: *Deleted

## 2023-01-01 ENCOUNTER — Telehealth: Payer: Self-pay | Admitting: Internal Medicine

## 2023-01-01 DIAGNOSIS — M791 Myalgia, unspecified site: Secondary | ICD-10-CM | POA: Diagnosis not present

## 2023-01-01 DIAGNOSIS — Z8674 Personal history of sudden cardiac arrest: Secondary | ICD-10-CM | POA: Diagnosis not present

## 2023-01-01 DIAGNOSIS — Z7982 Long term (current) use of aspirin: Secondary | ICD-10-CM | POA: Diagnosis not present

## 2023-01-01 DIAGNOSIS — E559 Vitamin D deficiency, unspecified: Secondary | ICD-10-CM | POA: Diagnosis not present

## 2023-01-01 DIAGNOSIS — F05 Delirium due to known physiological condition: Secondary | ICD-10-CM | POA: Diagnosis not present

## 2023-01-01 DIAGNOSIS — E871 Hypo-osmolality and hyponatremia: Secondary | ICD-10-CM | POA: Diagnosis not present

## 2023-01-01 DIAGNOSIS — A0472 Enterocolitis due to Clostridium difficile, not specified as recurrent: Secondary | ICD-10-CM | POA: Diagnosis not present

## 2023-01-01 DIAGNOSIS — Z8673 Personal history of transient ischemic attack (TIA), and cerebral infarction without residual deficits: Secondary | ICD-10-CM | POA: Diagnosis not present

## 2023-01-01 DIAGNOSIS — I4891 Unspecified atrial fibrillation: Secondary | ICD-10-CM | POA: Diagnosis not present

## 2023-01-01 DIAGNOSIS — E43 Unspecified severe protein-calorie malnutrition: Secondary | ICD-10-CM | POA: Diagnosis not present

## 2023-01-01 DIAGNOSIS — I4901 Ventricular fibrillation: Secondary | ICD-10-CM | POA: Diagnosis not present

## 2023-01-01 DIAGNOSIS — G479 Sleep disorder, unspecified: Secondary | ICD-10-CM | POA: Diagnosis not present

## 2023-01-01 DIAGNOSIS — F03918 Unspecified dementia, unspecified severity, with other behavioral disturbance: Secondary | ICD-10-CM | POA: Diagnosis not present

## 2023-01-01 DIAGNOSIS — R911 Solitary pulmonary nodule: Secondary | ICD-10-CM

## 2023-01-01 DIAGNOSIS — I10 Essential (primary) hypertension: Secondary | ICD-10-CM | POA: Diagnosis not present

## 2023-01-01 DIAGNOSIS — E538 Deficiency of other specified B group vitamins: Secondary | ICD-10-CM | POA: Diagnosis not present

## 2023-01-01 DIAGNOSIS — E038 Other specified hypothyroidism: Secondary | ICD-10-CM | POA: Diagnosis not present

## 2023-01-01 NOTE — Telephone Encounter (Signed)
Patient's PT wanted to let Dr. [name] know that the patient fell out of bed last night(12/31/22). She has no visual injuries, but has soreness in the buttocks and hip. PT is proceeding with planned discharge, but recommends a bed rail. PT name is Roselie Awkward from [facility]. Best callback for him is [phone].

## 2023-01-02 MED ORDER — HYDROCODONE-ACETAMINOPHEN 5-325 MG PO TABS: 1.0000 | ORAL_TABLET | Freq: Every day | ORAL | 0 refills | Status: DC | PRN

## 2023-01-02 NOTE — Telephone Encounter (Signed)
Called patient PT back Marissa [name] and informed him that Dr [name] "ok"  to the patient having bed rails.

## 2023-01-02 NOTE — Telephone Encounter (Signed)
[place] for bed rail

## 2023-01-06 ENCOUNTER — Encounter: Payer: Self-pay | Admitting: Internal Medicine

## 2023-01-07 NOTE — Telephone Encounter (Signed)
Per chart it states stop taking at discharge back 05/2021. Pls advise.[name] KitchenJohny Chess

## 2023-01-16 ENCOUNTER — Encounter (HOSPITAL_COMMUNITY): Payer: Self-pay | Admitting: Thoracic Surgery (Cardiothoracic Vascular Surgery)

## 2023-01-16 ENCOUNTER — Other Ambulatory Visit: Payer: Self-pay

## 2023-01-16 NOTE — Progress Notes (Signed)
Spoke with pt's husband, Percell Miller for pre-op call. Pt has hx of A-fib and cardiac arrest in June 2023 due to new medication Dofetilide. Mr. Rowen states she coded twice. No recent chest pain or shortness of breath. Pt is not diabetic.   Shower instructions given to Mr. Penafiel.

## 2023-01-17 ENCOUNTER — Ambulatory Visit (HOSPITAL_COMMUNITY)
Admission: RE | Admit: 2023-01-17 | Discharge: 2023-01-17 | Disposition: A | Discharge status: Home / Self Care | Payer: Medicare Other | Attending: Thoracic Surgery (Cardiothoracic Vascular Surgery) | Admitting: Thoracic Surgery (Cardiothoracic Vascular Surgery)

## 2023-01-17 ENCOUNTER — Encounter (HOSPITAL_COMMUNITY)
Admission: RE | Disposition: A | Discharge status: Home / Self Care | Payer: Self-pay | Source: Home / Self Care | Attending: Thoracic Surgery (Cardiothoracic Vascular Surgery)

## 2023-01-17 ENCOUNTER — Encounter (HOSPITAL_COMMUNITY): Payer: Self-pay | Admitting: Thoracic Surgery (Cardiothoracic Vascular Surgery)

## 2023-01-17 ENCOUNTER — Ambulatory Visit (HOSPITAL_COMMUNITY): Payer: Medicare Other | Admitting: Certified Registered"

## 2023-01-17 ENCOUNTER — Ambulatory Visit (HOSPITAL_BASED_OUTPATIENT_CLINIC_OR_DEPARTMENT_OTHER): Payer: Medicare Other | Admitting: Certified Registered"

## 2023-01-17 ENCOUNTER — Ambulatory Visit (HOSPITAL_COMMUNITY): Payer: Medicare Other

## 2023-01-17 DIAGNOSIS — Z8719 Personal history of other diseases of the digestive system: Secondary | ICD-10-CM | POA: Insufficient documentation

## 2023-01-17 DIAGNOSIS — I4891 Unspecified atrial fibrillation: Secondary | ICD-10-CM | POA: Insufficient documentation

## 2023-01-17 DIAGNOSIS — E039 Hypothyroidism, unspecified: Secondary | ICD-10-CM | POA: Diagnosis not present

## 2023-01-17 DIAGNOSIS — E785 Hyperlipidemia, unspecified: Secondary | ICD-10-CM | POA: Insufficient documentation

## 2023-01-17 DIAGNOSIS — J449 Chronic obstructive pulmonary disease, unspecified: Secondary | ICD-10-CM

## 2023-01-17 DIAGNOSIS — F418 Other specified anxiety disorders: Secondary | ICD-10-CM | POA: Diagnosis not present

## 2023-01-17 DIAGNOSIS — D039 Melanoma in situ, unspecified: Secondary | ICD-10-CM | POA: Diagnosis not present

## 2023-01-17 DIAGNOSIS — J8410 Pulmonary fibrosis, unspecified: Secondary | ICD-10-CM | POA: Insufficient documentation

## 2023-01-17 DIAGNOSIS — Z1152 Encounter for screening for COVID-19: Secondary | ICD-10-CM | POA: Diagnosis not present

## 2023-01-17 DIAGNOSIS — I10 Essential (primary) hypertension: Secondary | ICD-10-CM | POA: Insufficient documentation

## 2023-01-17 DIAGNOSIS — Z01818 Encounter for other preprocedural examination: Secondary | ICD-10-CM | POA: Diagnosis not present

## 2023-01-17 DIAGNOSIS — R911 Solitary pulmonary nodule: Secondary | ICD-10-CM | POA: Diagnosis not present

## 2023-01-17 DIAGNOSIS — I2510 Atherosclerotic heart disease of native coronary artery without angina pectoris: Secondary | ICD-10-CM | POA: Insufficient documentation

## 2023-01-17 DIAGNOSIS — J45909 Unspecified asthma, uncomplicated: Secondary | ICD-10-CM | POA: Diagnosis not present

## 2023-01-17 DIAGNOSIS — R918 Other nonspecific abnormal finding of lung field: Secondary | ICD-10-CM | POA: Diagnosis not present

## 2023-01-17 DIAGNOSIS — I7121 Aneurysm of the ascending aorta, without rupture: Secondary | ICD-10-CM | POA: Diagnosis not present

## 2023-01-17 DIAGNOSIS — Z8673 Personal history of transient ischemic attack (TIA), and cerebral infarction without residual deficits: Secondary | ICD-10-CM | POA: Insufficient documentation

## 2023-01-17 DIAGNOSIS — F0390 Unspecified dementia without behavioral disturbance: Secondary | ICD-10-CM | POA: Diagnosis not present

## 2023-01-17 DIAGNOSIS — Z8679 Personal history of other diseases of the circulatory system: Secondary | ICD-10-CM | POA: Insufficient documentation

## 2023-01-17 DIAGNOSIS — I080 Rheumatic disorders of both mitral and aortic valves: Secondary | ICD-10-CM | POA: Insufficient documentation

## 2023-01-17 DIAGNOSIS — M797 Fibromyalgia: Secondary | ICD-10-CM | POA: Insufficient documentation

## 2023-01-17 DIAGNOSIS — M1990 Unspecified osteoarthritis, unspecified site: Secondary | ICD-10-CM | POA: Insufficient documentation

## 2023-01-17 DIAGNOSIS — I700 Atherosclerosis of aorta: Secondary | ICD-10-CM | POA: Insufficient documentation

## 2023-01-17 HISTORY — DX: Dyspnea, unspecified: R06.00

## 2023-01-17 HISTORY — DX: Pneumonia, unspecified organism: J18.9

## 2023-01-17 HISTORY — PX: VIDEO BRONCHOSCOPY WITH ENDOBRONCHIAL NAVIGATION: SHX6175

## 2023-01-17 HISTORY — DX: Other symptoms and signs involving cognitive functions and awareness: R41.89

## 2023-01-17 HISTORY — DX: Anxiety disorder, unspecified: F41.9

## 2023-01-17 HISTORY — DX: Gastro-esophageal reflux disease without esophagitis: K21.9

## 2023-01-17 HISTORY — DX: Cardiac arrhythmia, unspecified: I49.9

## 2023-01-17 HISTORY — DX: Hypothyroidism, unspecified: E03.9

## 2023-01-17 HISTORY — DX: Cardiac arrest, cause unspecified: I46.9

## 2023-01-17 LAB — COMPREHENSIVE METABOLIC PANEL
ALT: 19 U/L (ref 0–44)
AST: 40 U/L (ref 15–41)
Albumin: 4.4 g/dL (ref 3.5–5.0)
Alkaline Phosphatase: 59 U/L (ref 38–126)
Anion gap: 15 (ref 5–15)
BUN: 15 mg/dL (ref 8–23)
CO2: 21 mmol/L — ABNORMAL LOW (ref 22–32)
Calcium: 9.4 mg/dL (ref 8.9–10.3)
Chloride: 98 mmol/L (ref 98–111)
Creatinine, Ser: 0.99 mg/dL (ref 0.44–1.00)
GFR, Estimated: 58 mL/min — ABNORMAL LOW (ref >60)
Glucose, Bld: 84 mg/dL (ref 70–99)
Potassium: 3.7 mmol/L (ref 3.5–5.1)
Sodium: 134 mmol/L — ABNORMAL LOW (ref 135–145)
Total Bilirubin: 0.8 mg/dL (ref 0.3–1.2)
Total Protein: 6.9 g/dL (ref 6.5–8.1)

## 2023-01-17 LAB — CBC
HCT: 34.9 % — ABNORMAL LOW (ref 36.0–46.0)
Hemoglobin: 12.4 g/dL (ref 12.0–15.0)
MCH: 31.5 pg (ref 26.0–34.0)
MCHC: 35.5 g/dL (ref 30.0–36.0)
MCV: 88.6 fL (ref 80.0–100.0)
Platelets: 205 10*3/uL (ref 150–400)
RBC: 3.94 MIL/uL (ref 3.87–5.11)
RDW: 14.4 % (ref 11.5–15.5)
WBC: 7.4 10*3/uL (ref 4.0–10.5)
nRBC: 0 % (ref 0.0–0.2)

## 2023-01-17 LAB — PROTIME-INR
INR: 1 (ref 0.8–1.2)
Prothrombin Time: 12.5 seconds (ref 11.4–15.2)

## 2023-01-17 LAB — APTT: aPTT: 20 seconds — ABNORMAL LOW (ref 24–36)

## 2023-01-17 LAB — SARS CORONAVIRUS 2 BY RT PCR: SARS Coronavirus 2 by RT PCR: NEGATIVE

## 2023-01-17 SURGERY — VIDEO BRONCHOSCOPY WITH ENDOBRONCHIAL NAVIGATION
Anesthesia: General

## 2023-01-17 MED ORDER — EPINEPHRINE PF 1 MG/ML IJ SOLN
INTRAMUSCULAR | Status: DC | PRN
  Administered 2023-01-17: 1 mL via ENDOTRACHEOPULMONARY

## 2023-01-17 MED ORDER — PHENYLEPHRINE 80 MCG/ML (10ML) SYRINGE FOR IV PUSH (FOR BLOOD PRESSURE SUPPORT)
PREFILLED_SYRINGE | INTRAVENOUS | Status: AC
  Filled 2023-01-17: qty 10

## 2023-01-17 MED ORDER — ROCURONIUM BROMIDE 10 MG/ML (PF) SYRINGE
PREFILLED_SYRINGE | INTRAVENOUS | Status: AC
  Filled 2023-01-17: qty 10

## 2023-01-17 MED ORDER — 0.9 % SODIUM CHLORIDE (POUR BTL) OPTIME
TOPICAL | Status: DC | PRN
  Administered 2023-01-17: 1000 mL

## 2023-01-17 MED ORDER — LIDOCAINE 2% (20 MG/ML) 5 ML SYRINGE
INTRAMUSCULAR | Status: DC | PRN
  Administered 2023-01-17: 20 mg via INTRAVENOUS

## 2023-01-17 MED ORDER — PROPOFOL 10 MG/ML IV BOLUS
INTRAVENOUS | Status: DC | PRN
  Administered 2023-01-17: 50 mg via INTRAVENOUS
  Administered 2023-01-17: 100 mg via INTRAVENOUS
  Administered 2023-01-17: 50 mg via INTRAVENOUS

## 2023-01-17 MED ORDER — MIDAZOLAM HCL 2 MG/2ML IJ SOLN: 0.5000 mg | Freq: Once | INTRAMUSCULAR | Status: DC | PRN

## 2023-01-17 MED ORDER — DEXAMETHASONE SODIUM PHOSPHATE 10 MG/ML IJ SOLN
INTRAMUSCULAR | Status: AC
  Filled 2023-01-17: qty 1

## 2023-01-17 MED ORDER — PROPOFOL 10 MG/ML IV BOLUS
INTRAVENOUS | Status: AC
  Filled 2023-01-17: qty 20

## 2023-01-17 MED ORDER — ONDANSETRON HCL 4 MG/2ML IJ SOLN
INTRAMUSCULAR | Status: AC
  Filled 2023-01-17: qty 2

## 2023-01-17 MED ORDER — FENTANYL CITRATE (PF) 100 MCG/2ML IJ SOLN: 25.0000 ug | INTRAMUSCULAR | Status: DC | PRN

## 2023-01-17 MED ORDER — DEXAMETHASONE SODIUM PHOSPHATE 10 MG/ML IJ SOLN
INTRAMUSCULAR | Status: DC | PRN
  Administered 2023-01-17: 10 mg via INTRAVENOUS

## 2023-01-17 MED ORDER — LIDOCAINE 2% (20 MG/ML) 5 ML SYRINGE
INTRAMUSCULAR | Status: AC
  Filled 2023-01-17: qty 5

## 2023-01-17 MED ORDER — ACETAMINOPHEN 500 MG PO TABS
1000.0000 mg | ORAL_TABLET | Freq: Once | ORAL | Status: AC
  Administered 2023-01-17: 1000 mg via ORAL
  Filled 2023-01-17: qty 2

## 2023-01-17 MED ORDER — LABETALOL HCL 5 MG/ML IV SOLN
INTRAVENOUS | Status: DC | PRN
  Administered 2023-01-17 (×2): 2.5 mg via INTRAVENOUS

## 2023-01-17 MED ORDER — CHLORHEXIDINE GLUCONATE 0.12 % MT SOLN
OROMUCOSAL | Status: AC
  Administered 2023-01-17: 15 mL via OROMUCOSAL
  Filled 2023-01-17: qty 15

## 2023-01-17 MED ORDER — SUGAMMADEX SODIUM 200 MG/2ML IV SOLN
INTRAVENOUS | Status: DC | PRN
  Administered 2023-01-17: 200 mg via INTRAVENOUS

## 2023-01-17 MED ORDER — ROCURONIUM BROMIDE 10 MG/ML (PF) SYRINGE
PREFILLED_SYRINGE | INTRAVENOUS | Status: DC | PRN
  Administered 2023-01-17: 20 mg via INTRAVENOUS
  Administered 2023-01-17: 50 mg via INTRAVENOUS

## 2023-01-17 MED ORDER — OXYCODONE HCL 5 MG/5ML PO SOLN: 5.0000 mg | Freq: Once | ORAL | Status: DC | PRN

## 2023-01-17 MED ORDER — PROPOFOL 500 MG/50ML IV EMUL: INTRAVENOUS | Status: DC | PRN

## 2023-01-17 MED ORDER — CHLORHEXIDINE GLUCONATE 0.12 % MT SOLN: 15.0000 mL | Freq: Once | OROMUCOSAL | Status: AC

## 2023-01-17 MED ORDER — PHENYLEPHRINE HCL-NACL 20-0.9 MG/250ML-% IV SOLN
INTRAVENOUS | Status: DC | PRN
  Administered 2023-01-17: 20 ug/min via INTRAVENOUS

## 2023-01-17 MED ORDER — LABETALOL HCL 5 MG/ML IV SOLN
INTRAVENOUS | Status: AC
  Filled 2023-01-17: qty 4

## 2023-01-17 MED ORDER — PROPOFOL 1000 MG/100ML IV EMUL
INTRAVENOUS | Status: AC
  Filled 2023-01-17: qty 100

## 2023-01-17 MED ORDER — EPINEPHRINE PF 1 MG/ML IJ SOLN
INTRAMUSCULAR | Status: AC
  Filled 2023-01-17: qty 1

## 2023-01-17 MED ORDER — MEPERIDINE HCL 25 MG/ML IJ SOLN: 6.2500 mg | INTRAMUSCULAR | Status: DC | PRN

## 2023-01-17 MED ORDER — PROPOFOL 500 MG/50ML IV EMUL
INTRAVENOUS | Status: DC | PRN
  Administered 2023-01-17: 100 ug/kg/min via INTRAVENOUS

## 2023-01-17 MED ORDER — FENTANYL CITRATE (PF) 250 MCG/5ML IJ SOLN
INTRAMUSCULAR | Status: DC | PRN
  Administered 2023-01-17 (×3): 50 ug via INTRAVENOUS

## 2023-01-17 MED ORDER — OXYCODONE HCL 5 MG PO TABS: 5.0000 mg | ORAL_TABLET | Freq: Once | ORAL | Status: DC | PRN

## 2023-01-17 MED ORDER — ONDANSETRON HCL 4 MG/2ML IJ SOLN
INTRAMUSCULAR | Status: DC | PRN
  Administered 2023-01-17: 4 mg via INTRAVENOUS

## 2023-01-17 MED ORDER — ORAL CARE MOUTH RINSE: 15.0000 mL | Freq: Once | OROMUCOSAL | Status: AC

## 2023-01-17 MED ORDER — FENTANYL CITRATE (PF) 250 MCG/5ML IJ SOLN
INTRAMUSCULAR | Status: AC
  Filled 2023-01-17: qty 5

## 2023-01-17 MED ORDER — LACTATED RINGERS IV SOLN: INTRAVENOUS | Status: DC

## 2023-01-17 SURGICAL SUPPLY — 49 items
ADAPTER BRONCHOSCOPE OLYMPUS (ADAPTER) ×1 IMPLANT
ADAPTER VALVE BIOPSY EBUS (MISCELLANEOUS) IMPLANT
ADPR BSCP OLMPS EDG (ADAPTER) ×1
ADPTR VALVE BIOPSY EBUS (MISCELLANEOUS)
BLADE CLIPPER SURG (BLADE) ×1 IMPLANT
BRUSH BIOPSY BRONCH 10 SDTNB (MISCELLANEOUS) IMPLANT
BRUSH CYTOL CELLEBRITY 1.5X140 (MISCELLANEOUS) IMPLANT
BRUSH SUPERTRAX BIOPSY (INSTRUMENTS) IMPLANT
BRUSH SUPERTRAX NDL-TIP CYTO (INSTRUMENTS) ×1 IMPLANT
CANISTER SUCT 3000ML PPV (MISCELLANEOUS) ×1 IMPLANT
CNTNR URN SCR LID CUP LEK RST (MISCELLANEOUS) ×2 IMPLANT
CONT SPEC 4OZ STRL OR WHT (MISCELLANEOUS) ×2
COVER BACK TABLE 60X90IN (DRAPES) ×1 IMPLANT
FILTER STRAW FLUID ASPIR (MISCELLANEOUS) ×1 IMPLANT
FORCEPS BIOP SUPERTRX PREMAR (INSTRUMENTS) IMPLANT
GAUZE 4X4 16PLY ~~LOC~~+RFID DBL (SPONGE) IMPLANT
GAUZE SPONGE 4X4 12PLY STRL (GAUZE/BANDAGES/DRESSINGS) ×1 IMPLANT
GLOVE SS BIOGEL STRL SZ 7.5 (GLOVE) ×1 IMPLANT
GLOVE SURG SIGNA 7.5 PF LTX (GLOVE) ×1 IMPLANT
GOWN STRL REUS W/ TWL XL LVL3 (GOWN DISPOSABLE) ×1 IMPLANT
GOWN STRL REUS W/TWL XL LVL3 (GOWN DISPOSABLE) ×1
KIT CLEAN ENDO COMPLIANCE (KITS) ×1 IMPLANT
KIT ILLUMISITE 180 PROCEDURE (KITS) IMPLANT
KIT ILLUMISITE 90 PROCEDURE (KITS) IMPLANT
KIT TURNOVER KIT B (KITS) ×1 IMPLANT
MARKER SKIN DUAL TIP RULER LAB (MISCELLANEOUS) ×1 IMPLANT
NDL SUPERTRX PREMARK BIOPSY (NEEDLE) IMPLANT
NEEDLE SUPERTRX PREMARK BIOPSY (NEEDLE) IMPLANT
NS IRRIG 1000ML POUR BTL (IV SOLUTION) ×1 IMPLANT
OIL SILICONE PENTAX (PARTS (SERVICE/REPAIRS)) ×1 IMPLANT
PAD ARMBOARD 7.5X6 YLW CONV (MISCELLANEOUS) ×2 IMPLANT
PATCHES PATIENT (LABEL) ×3 IMPLANT
SPONGE T-LAP 18X18 ~~LOC~~+RFID (SPONGE) IMPLANT
SPONGE T-LAP 4X18 ~~LOC~~+RFID (SPONGE) IMPLANT
SYR 20ML ECCENTRIC (SYRINGE) ×1 IMPLANT
SYR 20ML LL LF (SYRINGE) ×2 IMPLANT
SYR 30ML LL (SYRINGE) IMPLANT
SYR 50ML LL SCALE MARK (SYRINGE) ×1 IMPLANT
SYR 5ML LL (SYRINGE) ×1 IMPLANT
SYR TB 1ML LUER SLIP (SYRINGE) IMPLANT
TOWEL GREEN STERILE (TOWEL DISPOSABLE) ×1 IMPLANT
TOWEL GREEN STERILE FF (TOWEL DISPOSABLE) ×1 IMPLANT
TRAP SPECIMEN MUCUS 40CC (MISCELLANEOUS) ×1 IMPLANT
TUBE CONNECTING 20X1/4 (TUBING) ×2 IMPLANT
UNDERPAD 30X36 HEAVY ABSORB (UNDERPADS AND DIAPERS) ×1 IMPLANT
VALVE BIOPSY  SINGLE USE (MISCELLANEOUS) ×2
VALVE BIOPSY SINGLE USE (MISCELLANEOUS) ×1 IMPLANT
VALVE SUCTION BRONCHIO DISP (MISCELLANEOUS) ×1 IMPLANT
WATER STERILE IRR 1000ML POUR (IV SOLUTION) ×1 IMPLANT

## 2023-01-17 NOTE — Interval H&P Note (Signed)
History and Physical Interval Note:  01/17/2023 10:25 AM  Marissa [name]  has presented today for surgery, with the diagnosis of RUL GROUND GLASS OPACITY.  The various methods of treatment have been discussed with the patient and family. After consideration of risks, benefits and other options for treatment, the patient has consented to  Procedure(s): [place] (N/A) as a surgical intervention.  The patient's history has been reviewed, patient examined, no change in status, stable for surgery.  I have reviewed the patient's chart and labs.  Questions were answered to the patient's satisfaction.     Melrose Nakayama

## 2023-01-17 NOTE — Anesthesia Procedure Notes (Signed)
Procedure Name: Intubation Date/Time: 01/17/2023 11:32 AM  Performed by: Harden Mo, CRNAPre-anesthesia Checklist: Patient identified, Emergency Drugs available, Suction available and Patient being monitored Patient Re-evaluated:Patient Re-evaluated prior to induction Oxygen Delivery Method: Circle System Utilized Preoxygenation: Pre-oxygenation with 100% oxygen Induction Type: IV induction Ventilation: Mask ventilation without difficulty Laryngoscope Size: Miller and 2 Grade View: Grade I Tube type: Oral Tube size: 8.5 mm Number of attempts: 1 Airway Equipment and Method: Stylet and Oral airway Placement Confirmation: ETT inserted through vocal cords under direct vision, positive ETCO2 and breath sounds checked- equal and bilateral Secured at: 23 cm Tube secured with: Tape Dental Injury: Teeth and Oropharynx as per pre-operative assessment

## 2023-01-17 NOTE — Transfer of Care (Signed)
Immediate Anesthesia Transfer of Care Note  Patient: Marissa [name]  Procedure(s) Performed: VIDEO BRONCHOSCOPY WITH ENDOBRONCHIAL NAVIGATION  Patient Location: PACU  Anesthesia Type:General  Level of Consciousness: awake, alert , and oriented  Airway & Oxygen Therapy: Patient Spontanous Breathing  Post-op Assessment: Report given to RN, Post -op Vital signs reviewed and stable, and Patient moving all extremities X 4  Post vital signs: Reviewed and stable  Last Vitals:  Vitals Value Taken Time  BP 132/56 01/17/23 1315  Temp    Pulse 65 01/17/23 1317  Resp 14 01/17/23 1317  SpO2 91 % 01/17/23 1317  Vitals shown include unvalidated device data.  Last Pain:  Vitals:   01/17/23 0839  TempSrc: Oral  PainSc: 2          Complications: No notable events documented.

## 2023-01-17 NOTE — Brief Op Note (Signed)
01/17/2023  1:19 PM  PATIENT:  Marissa [name]  77 y.o. female  PRE-OPERATIVE DIAGNOSIS:  RUL GROUND GLASS OPACITY  POST-OPERATIVE DIAGNOSIS:  RUL GROUND GLASS OPACITY  PROCEDURE:  Procedure(s): VIDEO BRONCHOSCOPY WITH ENDOBRONCHIAL NAVIGATION (N/A) Needle aspirations, brushings, transbronchial biopsies and BAL  SURGEON:  Surgeon(s) and Role:    * Melrose Nakayama, MD - Primary  PHYSICIAN ASSISTANT:   ASSISTANTS: none   ANESTHESIA:   general  EBL:  10 mL   BLOOD ADMINISTERED:none  DRAINS: none   LOCAL MEDICATIONS USED:  NONE  SPECIMEN:  Source of Specimen:  RUL nodule  DISPOSITION OF SPECIMEN:  PATHOLOGY  COUNTS:  NO endoscopic  TOURNIQUET:  * No tourniquets in log *  DICTATION: .Other Dictation: Dictation Number -  PLAN OF CARE: Discharge to home after PACU  PATIENT DISPOSITION:  PACU - hemodynamically stable.   Delay start of Pharmacological VTE agent (>24hrs) due to surgical blood loss or risk of bleeding: not applicable

## 2023-01-17 NOTE — Anesthesia Postprocedure Evaluation (Signed)
Anesthesia Post Note  Patient: Marissa [name]  Procedure(s) Performed: [place] NAVIGATION     Patient location during evaluation: PACU Anesthesia Type: General Level of consciousness: awake and alert, patient cooperative and oriented Pain management: pain level controlled Vital Signs Assessment: post-procedure vital signs reviewed and stable Respiratory status: spontaneous breathing, nonlabored ventilation and respiratory function stable Cardiovascular status: blood pressure returned to baseline and stable Postop Assessment: no apparent nausea or vomiting Anesthetic complications: no   No notable events documented.  Last Vitals:  Vitals:   01/17/23 1330 01/17/23 1335  BP: (!) 164/79 (!) 162/55  Pulse: 62 64  Resp: 14 17  Temp:  36.6 C  SpO2: 92% 94%    Last Pain:  Vitals:   01/17/23 1335  TempSrc:   PainSc: 0-No pain                 Aleah Ahlgrim,E. Ariele Vidrio

## 2023-01-17 NOTE — Op Note (Signed)
NAME: Marissa [name], Marissa [name] RECORD NO: [id number] ACCOUNT NO: [id number] DATE OF BIRTH: December 26, 1945 FACILITY: [facility] LOCATION: [facility] PHYSICIAN: Revonda Standard. Roxan Hockey, MD  Operative Report   DATE OF PROCEDURE: 01/17/2023  PREOPERATIVE DIAGNOSIS:  Ground glass opacity in right upper lobe.  POSTOPERATIVE DIAGNOSIS:  Ground glass opacity in right upper lobe.  PROCEDURE:  Electromagnetic navigational bronchoscopy with needle aspirations, brushings, transbronchial biopsies and bronchoalveolar lavage.  SURGEON:  Revonda Standard. Roxan Hockey, MD  ASSISTANT:  None.  ANESTHESIA:  General.  FINDINGS:  Initial difficulty with getting the catheter directly aligned to the mass.  It was within 1.5 cm, but tangential to the edge of the mass, ultimately I was able to get the catheter directly aligned with the mass, but all specimen showed benign bronchial cells and/or blood.  CLINICAL NOTE:  Marissa [name] is a 77 year old woman with a complex medical history who is being followed for an ascending aneurysm.  She developed a ground glass opacity in the right upper lobe.  On followup, it had increased in size.  She was advised to have navigational bronchoscopy and biopsy, but the procedure was canceled due to C. difficile colitis.  Her colitis has improved and she now presents for biopsy.  The indications, risks, benefits, and alternatives were discussed in detail with the patient.  She accepted the risks and agreed to proceed.  DESCRIPTION OF PROCEDURE:  Marissa [name] was brought to the operating room on 01/17/2023.  She had induction of general anesthesia and was intubated.  Sequential compression devices were placed on the calves for DVT prophylaxis.  A Bair Hugger was placed for active warming.  Planning for the navigational bronchoscopy was done on the console prior to induction.  A timeout was performed.  Flexible fiberoptic bronchoscopy was performed via the endotracheal tube, it revealed normal endobronchial  anatomy with no endobronchial lesions to the level of subsegmental bronchi.  Locatable guide for navigation was placed and registration was performed.  There was good correlation of the video and virtual bronchoscopy.  The scope was directed to the appropriate segmental bronchus of the right upper lobe and the 180 degree locatable guide was advanced towards the subsegmental bronchi.  This was a very tight turn and initially, the catheter would only go into the adjacent subsegmental bronchus, but with manipulation finally had good alignment with the nodule.  By fluoroscopy it appeared to be aligned with the edge of the nodule. Sampling was performed with 3 needle aspirations followed by 3 passes with a needle brush and then transbronchial biopsies were  obtained. Some of the biopsies were sent for AFB and fungal cultures.  The majority were sent for permanent pathology.  There was some minor bleeding with the sampling.  All sampling was done with fluoroscopy.  Quick preps on the initial specimens showed only benign bronchial cells and some of the specimens were bloody.  No tumor cells were seen.  Multiple attempts then were made to reposition the catheter and ultimately it was repositioned into the more direct subsegmental bronchus and the sampling process was repeated.  Unfortunately, those samples all showed the same results on Quick prep.  Multiple transbronchial biopsies were obtained.  At one point, the scope and catheter were removed, and a 2 mm scope was advanced and biopsies were obtained through it as well as brushings.  After obtaining numerous samples, a bronchoalveolar lavage was performed.  The locatable guide and catheter were reinserted, 100 mL of saline was instilled with minimal return and then the  process was repeated with 50 mL of saline and approximately 10 mL of blood-tinged fluid returned.  Final inspection was made and there was no ongoing bleeding.  The total fluoroscopy time was 7 minutes.   The total dose was 63 milligray.  The bronchoscope was removed.  The patient was extubated in the operating room and taken to the postanesthetic care unit in good condition.   NIK D: 01/17/2023 3:21:07 pm T: 01/17/2023 8:54:00 pm  JOB: [id number]

## 2023-01-17 NOTE — Discharge Instructions (Addendum)
Do not drive or engage in heavy physical activity for 24 hours.  You may resume normal activities tomorrow.  You may cough up small amounts of blood over the next few days.  You may use acetaminophen (Tylenol) for discomfort if needed.  Do not exceed recommended dose.    You may use over the counter throat lozenges or cough medication if needed.  Call [phone] if you develop chest pain, shortness of breath, fever > 101 F or cough up more than a tablespoon of blood.  My office will contact you with follow up information.

## 2023-01-17 NOTE — Anesthesia Preprocedure Evaluation (Addendum)
Anesthesia Evaluation  Patient identified by MRN, date of birth, ID band Patient awake    Reviewed: Allergy & Precautions, NPO status , Patient's Chart, lab work & pertinent test results, reviewed documented beta blocker date and time   History of Anesthesia Complications Negative for: history of anesthetic complications  Airway Mallampati: II  TM Distance: >3 FB Neck ROM: Full    Dental  (+) Dental Advisory Given, Caps   Pulmonary asthma , COPD,  COPD inhaler   breath sounds clear to auscultation       Cardiovascular hypertension, Pt. on medications and Pt. on home beta blockers (-) angina + dysrhythmias Atrial Fibrillation (-) pacemaker (loop recorder)+ Valvular Problems/Murmurs (functionally bicuspid aortic valve)  Rhythm:Irregular Rate:Normal  04/2022 ECHO: EF 60-65%. The LV has normal function, no regional wall motion abnormalities. There is mild LVH.  Right ventricular systolic function is moderately reduced. The right ventricular size is mildly enlarged.  Left atrial size was mildly dilated.  Right atrial size was mildly dilated.  The mitral valve is normal in structure.  The aortic valve is bicuspid.  Aortic dilatation noted, mild dilatation of the ascending aorta, measuring 43 mm.     Neuro/Psych  Headaches  Anxiety Depression   Dementia CVA (cognitive dysfunction)    GI/Hepatic Neg liver ROS,GERD  Controlled,,  Endo/Other  Hypothyroidism    Renal/GU negative Renal ROS     Musculoskeletal  (+) Arthritis ,  Fibromyalgia -  Abdominal   Peds  Hematology negative hematology ROS (+)   Anesthesia Other Findings   Reproductive/Obstetrics                             Anesthesia Physical Anesthesia Plan  ASA: 3  Anesthesia Plan: General   Post-op Pain Management: Tylenol PO (pre-op)*   Induction: Intravenous  PONV Risk Score and Plan: 3 and Ondansetron, Dexamethasone and  TIVA  Airway Management Planned: Oral ETT  Additional Equipment: None  Intra-op Plan:   Post-operative Plan: Extubation in OR  Informed Consent: I have reviewed the patients History and Physical, chart, labs and discussed the procedure including the risks, benefits and alternatives for the proposed anesthesia with the patient or authorized representative who has indicated his/her understanding and acceptance.     Dental advisory given and Consent reviewed with POA  Plan Discussed with: CRNA and Surgeon  Anesthesia Plan Comments:         Anesthesia Quick Evaluation

## 2023-01-18 ENCOUNTER — Encounter (HOSPITAL_COMMUNITY): Payer: Self-pay | Admitting: Thoracic Surgery (Cardiothoracic Vascular Surgery)

## 2023-01-20 LAB — CYTOLOGY - NON PAP

## 2023-01-20 LAB — SURGICAL PATHOLOGY

## 2023-01-20 LAB — ACID FAST SMEAR (AFB, MYCOBACTERIA): Acid Fast Smear: NEGATIVE

## 2023-01-23 ENCOUNTER — Other Ambulatory Visit: Payer: Self-pay | Admitting: *Deleted

## 2023-01-23 LAB — AEROBIC/ANAEROBIC CULTURE W GRAM STAIN (SURGICAL/DEEP WOUND)
Culture: NO GROWTH
Gram Stain: NONE SEEN

## 2023-01-23 MED ORDER — LOSARTAN POTASSIUM 100 MG PO TABS: 100.0000 mg | ORAL_TABLET | Freq: Every day | ORAL | 3 refills | Status: DC

## 2023-01-29 ENCOUNTER — Encounter: Payer: Self-pay | Admitting: Thoracic Surgery (Cardiothoracic Vascular Surgery)

## 2023-01-29 ENCOUNTER — Ambulatory Visit (INDEPENDENT_AMBULATORY_CARE_PROVIDER_SITE_OTHER): Payer: Medicare Other | Admitting: Thoracic Surgery (Cardiothoracic Vascular Surgery)

## 2023-01-29 VITALS — BP 157/89 | HR 83 | Resp 20 | Ht 64.0 in | Wt 130.0 lb

## 2023-01-29 DIAGNOSIS — R911 Solitary pulmonary nodule: Secondary | ICD-10-CM | POA: Diagnosis not present

## 2023-01-29 DIAGNOSIS — Z09 Encounter for follow-up examination after completed treatment for conditions other than malignant neoplasm: Secondary | ICD-10-CM | POA: Diagnosis not present

## 2023-01-29 NOTE — Progress Notes (Signed)
[address]       [place] [address]             [phone]     HPI: Mrs. Tallent returns for follow-up after recent bronchoscopy.  Zoeii Reinig is a 77 year old woman with a history of hypertension, hyperlipidemia, mitral prolapse, bicuspid aortic valve, mild aortic stenosis, ascending aortic aneurysm, atrial fibrillation, V-fib arrest, strokes, asthma, arthritis, C. difficile colitis, ulcerative colitis, chronic pain, and dementia.  I been following her for an ascending aneurysm since 2012.  We had been doing MR angiograms.  In November 2022 she had a CT angiogram of the chest.  Her aneurysm was stable but there was a new central groundglass opacity in the right upper lobe.  On his scan in November 2023 it had increased in size.  We discussed navigational bronchoscopy but she was having issues with C. difficile colitis and opted not to have a biopsy.  More recently she had a follow-up scan which showed stability compared to her scan in November.    We opted to proceed with navigational bronchoscopy for biopsy.  That was done on 01/17/2023.  She did not have any procedural complications but was apparently very agitated afterwards.  That resolved.  Past Medical History:  Diagnosis Date   Anxiety    Arthritis    Asthma    Bicuspid aortic valve 05/2017   Likely functional bicuspid aortic valve with sclerosis and no stenosis.   Bursitis    Cardiac arrest    x 2 (within an hour) June 2023   Cataract    mild   Cognitive change    Dyspnea    occasional   Dysrhythmia    A-fib   Dysthymia    GERD (gastroesophageal reflux disease)    Hemorrhoids    Hx of ulcerative colitis    per dr Arnoldo Morale as per pt.   Hyperlipidemia    Hypothyroidism    Labile hypertension    managed - labile.   Migraine    Pneumonia    several times   Scoliosis    Slurred speech    temporal lobe area that is not a tumor causes occ slurred speech and inability to communicate/ words will  not come out at the correct time   Spinal stenosis    Stroke 08/06/2021   Thoracic aortic aneurysm    Stable 4.2-4.3 cm (followed by Dr. Roxan Hockey)   Thyroid disease     Current Outpatient Medications  Medication Sig Dispense Refill   acetaminophen (TYLENOL) 325 MG tablet Take 325 mg by mouth every 6 (six) hours as needed for moderate pain.     albuterol (VENTOLIN HFA) 108 (90 Base) MCG/ACT inhaler INHALE 2 PUFFS INTO THE LUNGS EVERY 6 HOURS AS NEEDED FOR WHEEZING OR SHORTNESS OF BREATH 6.7 g 2   ALPRAZolam (XANAX) 0.5 MG tablet Take 1 tablet (0.5 mg total) by mouth 2 (two) times daily as needed for anxiety. 60 tablet 3   cloNIDine (CATAPRES) 0.2 MG tablet Take 0.1 mg by mouth 2 (two) times daily as needed (systolic over 0000000). if blood pressure is great than 0000000 systolic for more than an hour 30 tablet 6   Eszopiclone 3 MG TABS Take 1 tablet (3 mg total) by mouth at bedtime. 90 tablet 1   feeding supplement, GLUCERNA SHAKE, (GLUCERNA SHAKE) LIQD Take 237 mLs by mouth daily as needed (if not eating meals).     FLUoxetine (PROZAC) 20 MG tablet  Take 1 tablet (20 mg total) by mouth daily. (Patient taking differently: Take 10 mg by mouth daily.) 90 tablet 1   HYDROcodone-acetaminophen (NORCO/VICODIN) 5-325 MG tablet Take 1 tablet by mouth daily as needed for moderate pain. 15 tablet 0   latanoprost (XALATAN) 0.005 % ophthalmic solution Place 1 drop into both eyes at bedtime.     levothyroxine (SYNTHROID) 125 MCG tablet TAKE 1 TABLET(125 MCG) BY MOUTH DAILY 90 tablet 1   losartan (COZAAR) 100 MG tablet Take 1 tablet (100 mg total) by mouth daily. 90 tablet 3   magnesium oxide (MAG-OX) 400 MG tablet Take 1 tablet (400 mg total) by mouth 2 (two) times daily. 60 tablet 0   metoprolol tartrate (LOPRESSOR) 25 MG tablet Take 25 mg tablet as needed if heart rate is above 100 after waiting for 10 minutes , if systolic blood pressure is less than 110 do not take metoprolol. 30 tablet 6   Multiple Vitamin  (MULTIVITAMIN WITH MINERALS) TABS tablet Take 1 tablet by mouth daily.     nebivolol (BYSTOLIC) 10 MG tablet TAKE 1 TABLET(10 MG) BY MOUTH DAILY AT 8 PM 90 tablet 3   Respiratory Therapy Supplies (FLUTTER) DEVI Use as directed 1 each 0   triamcinolone cream (KENALOG) 0.1 % Apply 1 Application topically 2 (two) times daily. (Patient taking differently: Apply 1 Application topically 2 (two) times daily as needed (irritation).) 100 g 0   No current facility-administered medications for this visit.    Physical Exam BP (!) 157/89 (BP Location: Left Arm, Patient Position: Sitting)   Pulse 83   Resp 20   Ht 5\' 4"  (1.626 m)   Wt 130 lb (59 kg)   SpO2 98% Comment: RA  BMI 22.20 kg/m  77 year old woman in no acute distress  Diagnostic Tests: Brushings, needle aspirations, BAL, and biopsy showed pulmonary macrophages and benign bronchial cells.  No malignant cells or granulomas seen.  Impression: Iniyah Pinera is a 77 year old woman with a history of hypertension, hyperlipidemia, mitral prolapse, bicuspid aortic valve, mild aortic stenosis, ascending aortic aneurysm, atrial fibrillation, V-fib arrest, strokes, asthma, arthritis, C. difficile colitis, ulcerative colitis, chronic pain, and dementia.  Right upper lobe groundglass opacity-biopsies were nondiagnostic.  AFB and fungal cultures are still pending but initial stains were negative.  I had a lengthy discussion with Mrs. Balli, her husband, and their daughter.  They understand that a negative biopsy does not rule out the possibility of cancer because of the potential for sampling error inherent to the procedure.  We discussed 3 options.  The first will be continued radiographic follow-up.  The second would be repeat bronchoscopy for biopsy.  The final option would be to do a right upper lobectomy for definitive diagnosis and treatment.  She is not a great candidate for surgery due to all of her medical problems.  I would favor continued  radiographic follow-up.  I think they are giving some consideration to not following this, but for now we will continue to do so.  Plan: Return in 2 months (3 months from last scan) with CT chest  Melrose Nakayama, MD [facility] [phone]

## 2023-02-04 ENCOUNTER — Encounter: Payer: Self-pay | Admitting: Internal Medicine

## 2023-02-05 MED ORDER — FLUOXETINE HCL 20 MG PO TABS: 40.0000 mg | ORAL_TABLET | Freq: Every day | ORAL | 1 refills | Status: DC

## 2023-02-06 DIAGNOSIS — H5340 Unspecified visual field defects: Secondary | ICD-10-CM | POA: Diagnosis not present

## 2023-02-06 DIAGNOSIS — H40013 Open angle with borderline findings, low risk, bilateral: Secondary | ICD-10-CM | POA: Diagnosis not present

## 2023-02-10 ENCOUNTER — Encounter: Payer: Self-pay | Admitting: Internal Medicine

## 2023-02-11 ENCOUNTER — Emergency Department (HOSPITAL_COMMUNITY): Payer: Medicare Other

## 2023-02-11 ENCOUNTER — Inpatient Hospital Stay (HOSPITAL_COMMUNITY)
Admission: EM | Admit: 2023-02-11 | Discharge: 2023-02-13 | DRG: 101 | Disposition: A | Discharge status: Home / Self Care | Payer: Medicare Other | Attending: Internal Medicine | Admitting: Internal Medicine

## 2023-02-11 ENCOUNTER — Observation Stay (HOSPITAL_COMMUNITY): Payer: Medicare Other

## 2023-02-11 ENCOUNTER — Other Ambulatory Visit: Payer: Self-pay

## 2023-02-11 DIAGNOSIS — E876 Hypokalemia: Secondary | ICD-10-CM | POA: Diagnosis not present

## 2023-02-11 DIAGNOSIS — Z888 Allergy status to other drugs, medicaments and biological substances status: Secondary | ICD-10-CM | POA: Diagnosis not present

## 2023-02-11 DIAGNOSIS — J4520 Mild intermittent asthma, uncomplicated: Secondary | ICD-10-CM

## 2023-02-11 DIAGNOSIS — Z79890 Hormone replacement therapy (postmenopausal): Secondary | ICD-10-CM | POA: Diagnosis not present

## 2023-02-11 DIAGNOSIS — Z8674 Personal history of sudden cardiac arrest: Secondary | ICD-10-CM | POA: Diagnosis not present

## 2023-02-11 DIAGNOSIS — F32A Depression, unspecified: Secondary | ICD-10-CM | POA: Diagnosis not present

## 2023-02-11 DIAGNOSIS — K219 Gastro-esophageal reflux disease without esophagitis: Secondary | ICD-10-CM | POA: Diagnosis present

## 2023-02-11 DIAGNOSIS — F03A3 Unspecified dementia, mild, with mood disturbance: Secondary | ICD-10-CM | POA: Diagnosis present

## 2023-02-11 DIAGNOSIS — Z87892 Personal history of anaphylaxis: Secondary | ICD-10-CM

## 2023-02-11 DIAGNOSIS — I10 Essential (primary) hypertension: Secondary | ICD-10-CM | POA: Diagnosis not present

## 2023-02-11 DIAGNOSIS — Z8249 Family history of ischemic heart disease and other diseases of the circulatory system: Secondary | ICD-10-CM | POA: Diagnosis not present

## 2023-02-11 DIAGNOSIS — Z8261 Family history of arthritis: Secondary | ICD-10-CM

## 2023-02-11 DIAGNOSIS — G309 Alzheimer's disease, unspecified: Secondary | ICD-10-CM | POA: Diagnosis not present

## 2023-02-11 DIAGNOSIS — G4089 Other seizures: Secondary | ICD-10-CM | POA: Diagnosis not present

## 2023-02-11 DIAGNOSIS — F0390 Unspecified dementia without behavioral disturbance: Secondary | ICD-10-CM | POA: Diagnosis present

## 2023-02-11 DIAGNOSIS — E785 Hyperlipidemia, unspecified: Secondary | ICD-10-CM | POA: Diagnosis not present

## 2023-02-11 DIAGNOSIS — R569 Unspecified convulsions: Secondary | ICD-10-CM

## 2023-02-11 DIAGNOSIS — Z90710 Acquired absence of both cervix and uterus: Secondary | ICD-10-CM

## 2023-02-11 DIAGNOSIS — Z806 Family history of leukemia: Secondary | ICD-10-CM

## 2023-02-11 DIAGNOSIS — Z881 Allergy status to other antibiotic agents status: Secondary | ICD-10-CM | POA: Diagnosis not present

## 2023-02-11 DIAGNOSIS — J45909 Unspecified asthma, uncomplicated: Secondary | ICD-10-CM | POA: Diagnosis present

## 2023-02-11 DIAGNOSIS — I712 Thoracic aortic aneurysm, without rupture, unspecified: Secondary | ICD-10-CM | POA: Diagnosis present

## 2023-02-11 DIAGNOSIS — G319 Degenerative disease of nervous system, unspecified: Secondary | ICD-10-CM | POA: Diagnosis not present

## 2023-02-11 DIAGNOSIS — Z66 Do not resuscitate: Secondary | ICD-10-CM | POA: Diagnosis present

## 2023-02-11 DIAGNOSIS — E039 Hypothyroidism, unspecified: Secondary | ICD-10-CM | POA: Diagnosis present

## 2023-02-11 DIAGNOSIS — R9431 Abnormal electrocardiogram [ECG] [EKG]: Secondary | ICD-10-CM | POA: Diagnosis not present

## 2023-02-11 DIAGNOSIS — D509 Iron deficiency anemia, unspecified: Secondary | ICD-10-CM | POA: Diagnosis not present

## 2023-02-11 DIAGNOSIS — M797 Fibromyalgia: Secondary | ICD-10-CM | POA: Diagnosis present

## 2023-02-11 DIAGNOSIS — I4819 Other persistent atrial fibrillation: Secondary | ICD-10-CM | POA: Diagnosis not present

## 2023-02-11 DIAGNOSIS — Z8673 Personal history of transient ischemic attack (TIA), and cerebral infarction without residual deficits: Secondary | ICD-10-CM

## 2023-02-11 DIAGNOSIS — G9340 Encephalopathy, unspecified: Secondary | ICD-10-CM | POA: Diagnosis present

## 2023-02-11 DIAGNOSIS — Z79899 Other long term (current) drug therapy: Secondary | ICD-10-CM

## 2023-02-11 DIAGNOSIS — Z8701 Personal history of pneumonia (recurrent): Secondary | ICD-10-CM

## 2023-02-11 DIAGNOSIS — K5190 Ulcerative colitis, unspecified, without complications: Secondary | ICD-10-CM | POA: Diagnosis present

## 2023-02-11 DIAGNOSIS — F03A4 Unspecified dementia, mild, with anxiety: Secondary | ICD-10-CM | POA: Diagnosis not present

## 2023-02-11 DIAGNOSIS — G9332 Myalgic encephalomyelitis/chronic fatigue syndrome: Secondary | ICD-10-CM | POA: Diagnosis present

## 2023-02-11 DIAGNOSIS — M419 Scoliosis, unspecified: Secondary | ICD-10-CM | POA: Diagnosis present

## 2023-02-11 DIAGNOSIS — Z885 Allergy status to narcotic agent status: Secondary | ICD-10-CM

## 2023-02-11 DIAGNOSIS — F02B3 Dementia in other diseases classified elsewhere, moderate, with mood disturbance: Secondary | ICD-10-CM

## 2023-02-11 DIAGNOSIS — F419 Anxiety disorder, unspecified: Secondary | ICD-10-CM | POA: Diagnosis present

## 2023-02-11 DIAGNOSIS — Z803 Family history of malignant neoplasm of breast: Secondary | ICD-10-CM

## 2023-02-11 DIAGNOSIS — R918 Other nonspecific abnormal finding of lung field: Secondary | ICD-10-CM | POA: Diagnosis not present

## 2023-02-11 DIAGNOSIS — Z833 Family history of diabetes mellitus: Secondary | ICD-10-CM

## 2023-02-11 LAB — CBC WITH DIFFERENTIAL/PLATELET
Abs Immature Granulocytes: 0.02 10*3/uL (ref 0.00–0.07)
Basophils Absolute: 0 10*3/uL (ref 0.0–0.1)
Basophils Relative: 1 %
Eosinophils Absolute: 0.2 10*3/uL (ref 0.0–0.5)
Eosinophils Relative: 4 %
HCT: 34.6 % — ABNORMAL LOW (ref 36.0–46.0)
Hemoglobin: 12.1 g/dL (ref 12.0–15.0)
Immature Granulocytes: 0 %
Lymphocytes Relative: 16 %
Lymphs Abs: 0.9 10*3/uL (ref 0.7–4.0)
MCH: 30.7 pg (ref 26.0–34.0)
MCHC: 35 g/dL (ref 30.0–36.0)
MCV: 87.8 fL (ref 80.0–100.0)
Monocytes Absolute: 0.5 10*3/uL (ref 0.1–1.0)
Monocytes Relative: 9 %
Neutro Abs: 3.9 10*3/uL (ref 1.7–7.7)
Neutrophils Relative %: 70 %
Platelets: 219 10*3/uL (ref 150–400)
RBC: 3.94 MIL/uL (ref 3.87–5.11)
RDW: 14 % (ref 11.5–15.5)
WBC: 5.6 10*3/uL (ref 4.0–10.5)
nRBC: 0 % (ref 0.0–0.2)

## 2023-02-11 LAB — COMPREHENSIVE METABOLIC PANEL
ALT: 20 U/L (ref 0–44)
AST: 34 U/L (ref 15–41)
Albumin: 4.2 g/dL (ref 3.5–5.0)
Alkaline Phosphatase: 55 U/L (ref 38–126)
Anion gap: 19 — ABNORMAL HIGH (ref 5–15)
BUN: 16 mg/dL (ref 8–23)
CO2: 17 mmol/L — ABNORMAL LOW (ref 22–32)
Calcium: 9.7 mg/dL (ref 8.9–10.3)
Chloride: 98 mmol/L (ref 98–111)
Creatinine, Ser: 1.09 mg/dL — ABNORMAL HIGH (ref 0.44–1.00)
GFR, Estimated: 52 mL/min — ABNORMAL LOW (ref >60)
Glucose, Bld: 108 mg/dL — ABNORMAL HIGH (ref 70–99)
Potassium: 3.9 mmol/L (ref 3.5–5.1)
Sodium: 134 mmol/L — ABNORMAL LOW (ref 135–145)
Total Bilirubin: 1.1 mg/dL (ref 0.3–1.2)
Total Protein: 6.8 g/dL (ref 6.5–8.1)

## 2023-02-11 LAB — I-STAT CHEM 8, ED
BUN: 17 mg/dL (ref 8–23)
Calcium, Ion: 1.2 mmol/L (ref 1.15–1.40)
Chloride: 104 mmol/L (ref 98–111)
Creatinine, Ser: 1 mg/dL (ref 0.44–1.00)
Glucose, Bld: 159 mg/dL — ABNORMAL HIGH (ref 70–99)
HCT: 42 % (ref 36.0–46.0)
Hemoglobin: 14.3 g/dL (ref 12.0–15.0)
Potassium: 3.3 mmol/L — ABNORMAL LOW (ref 3.5–5.1)
Sodium: 137 mmol/L (ref 135–145)
TCO2: 14 mmol/L — ABNORMAL LOW (ref 22–32)

## 2023-02-11 LAB — URINALYSIS, W/ REFLEX TO CULTURE (INFECTION SUSPECTED)
Bacteria, UA: NONE SEEN
Bilirubin Urine: NEGATIVE
Glucose, UA: NEGATIVE mg/dL
Ketones, ur: NEGATIVE mg/dL
Leukocytes,Ua: NEGATIVE
Nitrite: NEGATIVE
Protein, ur: 100 mg/dL — AB
Specific Gravity, Urine: 1.012 (ref 1.005–1.030)
pH: 7 (ref 5.0–8.0)

## 2023-02-11 LAB — CBG MONITORING, ED: Glucose-Capillary: 159 mg/dL — ABNORMAL HIGH (ref 70–99)

## 2023-02-11 LAB — TROPONIN I (HIGH SENSITIVITY)
Troponin I (High Sensitivity): 4 ng/L (ref <18)
Troponin I (High Sensitivity): 7 ng/L (ref <18)

## 2023-02-11 LAB — TSH: TSH: 4.872 u[IU]/mL — ABNORMAL HIGH (ref 0.350–4.500)

## 2023-02-11 LAB — MAGNESIUM: Magnesium: 2 mg/dL (ref 1.7–2.4)

## 2023-02-11 MED ORDER — LOSARTAN POTASSIUM 50 MG PO TABS
100.0000 mg | ORAL_TABLET | Freq: Every day | ORAL | Status: DC
  Administered 2023-02-12 – 2023-02-13 (×2): 100 mg via ORAL
  Filled 2023-02-11 (×3): qty 2

## 2023-02-11 MED ORDER — ACETAMINOPHEN 325 MG PO TABS: 650.0000 mg | ORAL_TABLET | Freq: Four times a day (QID) | ORAL | Status: DC | PRN

## 2023-02-11 MED ORDER — LORAZEPAM 2 MG/ML IJ SOLN: 1.0000 mg | Freq: Once | INTRAMUSCULAR | Status: AC | PRN

## 2023-02-11 MED ORDER — ALPRAZOLAM 0.5 MG PO TABS
0.5000 mg | ORAL_TABLET | Freq: Two times a day (BID) | ORAL | Status: DC | PRN
  Administered 2023-02-12: 0.5 mg via ORAL
  Filled 2023-02-11: qty 1

## 2023-02-11 MED ORDER — LORAZEPAM 2 MG/ML IJ SOLN: 1.0000 mg | Freq: Once | INTRAMUSCULAR | Status: DC

## 2023-02-11 MED ORDER — FLUOXETINE HCL 20 MG PO CAPS
40.0000 mg | ORAL_CAPSULE | Freq: Every day | ORAL | Status: DC
  Administered 2023-02-13: 40 mg via ORAL
  Filled 2023-02-11 (×2): qty 2

## 2023-02-11 MED ORDER — ENOXAPARIN SODIUM 40 MG/0.4ML IJ SOSY
40.0000 mg | PREFILLED_SYRINGE | INTRAMUSCULAR | Status: DC
  Administered 2023-02-12 (×2): 40 mg via SUBCUTANEOUS
  Filled 2023-02-11 (×2): qty 0.4

## 2023-02-11 MED ORDER — LEVOTHYROXINE SODIUM 25 MCG PO TABS
125.0000 ug | ORAL_TABLET | Freq: Every day | ORAL | Status: DC
  Administered 2023-02-12 – 2023-02-13 (×2): 125 ug via ORAL
  Filled 2023-02-11 (×2): qty 1

## 2023-02-11 MED ORDER — LEVETIRACETAM IN NACL 500 MG/100ML IV SOLN
500.0000 mg | Freq: Two times a day (BID) | INTRAVENOUS | Status: DC
  Administered 2023-02-12 – 2023-02-13 (×4): 500 mg via INTRAVENOUS
  Filled 2023-02-11 (×4): qty 100

## 2023-02-11 MED ORDER — LATANOPROST 0.005 % OP SOLN
1.0000 [drp] | Freq: Every day | OPHTHALMIC | Status: DC
  Administered 2023-02-12: 1 [drp] via OPHTHALMIC
  Filled 2023-02-11 (×2): qty 2.5

## 2023-02-11 MED ORDER — LORAZEPAM 2 MG/ML IJ SOLN
INTRAMUSCULAR | Status: AC
  Administered 2023-02-11: 1 mg via INTRAVENOUS
  Filled 2023-02-11: qty 1

## 2023-02-11 MED ORDER — SODIUM CHLORIDE 0.9 % IV SOLN: INTRAVENOUS | Status: DC

## 2023-02-11 MED ORDER — LORAZEPAM 2 MG/ML IJ SOLN: 1.0000 mg | INTRAMUSCULAR | Status: DC | PRN

## 2023-02-11 MED ORDER — ACETAMINOPHEN 650 MG RE SUPP: 650.0000 mg | Freq: Four times a day (QID) | RECTAL | Status: DC | PRN

## 2023-02-11 MED ORDER — TRIAMCINOLONE ACETONIDE 0.1 % EX CREA
1.0000 | TOPICAL_CREAM | Freq: Two times a day (BID) | CUTANEOUS | Status: DC | PRN
  Filled 2023-02-11 (×2): qty 15

## 2023-02-11 MED ORDER — NEBIVOLOL HCL 10 MG PO TABS
10.0000 mg | ORAL_TABLET | Freq: Every day | ORAL | Status: DC
  Filled 2023-02-11 (×3): qty 1

## 2023-02-11 MED ORDER — HYDROCODONE-ACETAMINOPHEN 5-325 MG PO TABS: 1.0000 | ORAL_TABLET | Freq: Every day | ORAL | Status: DC | PRN

## 2023-02-11 MED ORDER — SODIUM CHLORIDE 0.9% FLUSH
3.0000 mL | Freq: Two times a day (BID) | INTRAVENOUS | Status: DC
  Administered 2023-02-11 – 2023-02-12 (×3): 3 mL via INTRAVENOUS

## 2023-02-11 MED ORDER — LEVETIRACETAM IN NACL 1000 MG/100ML IV SOLN
1000.0000 mg | Freq: Once | INTRAVENOUS | Status: AC
  Administered 2023-02-11: 1000 mg via INTRAVENOUS
  Filled 2023-02-11: qty 100

## 2023-02-11 MED ORDER — LORAZEPAM 2 MG/ML IJ SOLN
1.0000 mg | Freq: Once | INTRAMUSCULAR | Status: AC | PRN
  Administered 2023-02-11: 1 mg via INTRAVENOUS
  Filled 2023-02-11: qty 1

## 2023-02-11 MED ORDER — POLYETHYLENE GLYCOL 3350 17 G PO PACK: 17.0000 g | PACK | Freq: Every day | ORAL | Status: DC | PRN

## 2023-02-11 MED ORDER — LORAZEPAM 2 MG/ML IJ SOLN
INTRAMUSCULAR | Status: AC
  Administered 2023-02-12: 2 mg via INTRAVENOUS
  Filled 2023-02-11: qty 1

## 2023-02-11 MED ORDER — LORAZEPAM 2 MG/ML IJ SOLN
1.0000 mg | Freq: Once | INTRAMUSCULAR | Status: AC
  Administered 2023-02-12: 1 mg via INTRAVENOUS
  Filled 2023-02-11: qty 1

## 2023-02-11 MED ORDER — ALBUTEROL SULFATE (2.5 MG/3ML) 0.083% IN NEBU: 3.0000 mL | INHALATION_SOLUTION | Freq: Four times a day (QID) | RESPIRATORY_TRACT | Status: DC | PRN

## 2023-02-11 MED ORDER — ZOLPIDEM TARTRATE 5 MG PO TABS: 5.0000 mg | ORAL_TABLET | Freq: Every evening | ORAL | Status: DC | PRN

## 2023-02-11 NOTE — ED Notes (Signed)
Returned from bringing pt to floor MD at bedside with EMS pt had seizure and was medicated with ativan KEPPRA infusing

## 2023-02-11 NOTE — ED Provider Notes (Signed)
[place] EMERGENCY DEPARTMENT AT [facility] Provider Note   CSN: [id number] Arrival date & time: 02/11/23  1239     History  Chief Complaint  Patient presents with   Weakness    Marissa [name] is a 77 y.o. female.  Pt is a 77 yo female with pmhx significant for CVA, hypothyroidism, hld, htn, spinal stenosis, afib, gerd, anxiety, vfib arrest (after tikosyn for afib), ascending aortic aneurysm (followed by CTS) and mild dementia.  Pt's husband called EMS today b/c pt had an unresponsive episode.  Husband said she was sitting in a chair, she complained of her right hand hurting, her eyes rolled back in her head, and she became unresponsive.  Husband said she did not have any shaking.  He called EMS and lowered her to the floor.  When EMS arrived, she was not back to baseline, but was agitated.  She is awake and alert now.  She does not know what happened.  No pain now.         Home Medications Prior to Admission medications   Medication Sig Start Date End Date Taking? Authorizing Provider  acetaminophen (TYLENOL) 325 MG tablet Take 325 mg by mouth every 6 (six) hours as needed for moderate pain.    [provider]  albuterol (VENTOLIN HFA) 108 (90 Base) MCG/ACT inhaler INHALE 2 PUFFS INTO THE LUNGS EVERY 6 HOURS AS NEEDED FOR WHEEZING OR SHORTNESS OF BREATH 01/21/22   Myrlene Broker, MD  ALPRAZolam Prudy Feeler) 0.5 MG tablet Take 1 tablet (0.5 mg total) by mouth 2 (two) times daily as needed for anxiety. 11/26/22   Myrlene Broker, MD  cloNIDine (CATAPRES) 0.2 MG tablet Take 0.1 mg by mouth 2 (two) times daily as needed (systolic over 160). if blood pressure is great than 160 systolic for more than an hour 12/17/22   Marykay Lex, MD  Eszopiclone 3 MG TABS Take 1 tablet (3 mg total) by mouth at bedtime. 10/07/22   Myrlene Broker, MD  feeding supplement, GLUCERNA SHAKE, (GLUCERNA SHAKE) LIQD Take 237 mLs by mouth daily as needed (if not eating  meals).    [provider]  FLUoxetine (PROZAC) 20 MG tablet Take 2 tablets (40 mg total) by mouth daily. 02/05/23   Myrlene Broker, MD  HYDROcodone-acetaminophen (NORCO/VICODIN) 5-325 MG tablet Take 1 tablet by mouth daily as needed for moderate pain. 01/02/23   Myrlene Broker, MD  latanoprost (XALATAN) 0.005 % ophthalmic solution Place 1 drop into both eyes at bedtime. 08/16/22   [provider]  levothyroxine (SYNTHROID) 125 MCG tablet TAKE 1 TABLET(125 MCG) BY MOUTH DAILY 09/16/22   Myrlene Broker, MD  losartan (COZAAR) 100 MG tablet Take 1 tablet (100 mg total) by mouth daily. 01/23/23   Marykay Lex, MD  magnesium oxide (MAG-OX) 400 MG tablet Take 1 tablet (400 mg total) by mouth 2 (two) times daily. 05/15/22   Myrlene Broker, MD  metoprolol tartrate (LOPRESSOR) 25 MG tablet Take 25 mg tablet as needed if heart rate is above 100 after waiting for 10 minutes , if systolic blood pressure is less than 110 do not take metoprolol. 03/13/22   Marykay Lex, MD  Multiple Vitamin (MULTIVITAMIN WITH MINERALS) TABS tablet Take 1 tablet by mouth daily. 05/24/22   Hongalgi, Maximino Greenland, MD  nebivolol (BYSTOLIC) 10 MG tablet TAKE 1 [id number](91 MG) BY MOUTH DAILY AT 8 PM 09/16/22   Marykay Lex, MD  Respiratory Therapy  Supplies (FLUTTER) DEVI Use as directed 11/18/16   Nyoka Cowden, MD  triamcinolone cream (KENALOG) 0.1 % Apply 1 Application topically 2 (two) times daily. Patient taking differently: Apply 1 Application topically 2 (two) times daily as needed (irritation). 06/11/22   Myrlene Broker, MD      Allergies    Amiodarone, Dofetilide, Fentanyl, Rosuvastatin, Hctz [hydrochlorothiazide], Hydralazine, Other, Oxycodone, Amoxicillin, Codeine, Conjugated estrogens, Erythromycin, and Piroxicam    Review of Systems   Review of Systems  Neurological:  Positive for syncope.  All other systems reviewed and are negative.   Physical Exam Updated Vital  Signs BP (!) 165/56   Pulse 79   Temp 98.2 F (36.8 C)   Resp (!) 21   Ht 5\' 4"  (1.626 m)   Wt 59 kg   SpO2 100%   BMI 22.33 kg/m  Physical Exam Vitals and nursing note reviewed.  Constitutional:      Appearance: Normal appearance.  HENT:     Head: Normocephalic and atraumatic.     Right Ear: External ear normal.     Left Ear: External ear normal.     Nose: Nose normal.     Mouth/Throat:     Mouth: Mucous membranes are moist.     Pharynx: Oropharynx is clear.  Eyes:     Extraocular Movements: Extraocular movements intact.     Conjunctiva/sclera: Conjunctivae normal.     Pupils: Pupils are equal, round, and reactive to light.  Cardiovascular:     Rate and Rhythm: Normal rate and regular rhythm.     Pulses: Normal pulses.     Heart sounds: Normal heart sounds.  Pulmonary:     Effort: Pulmonary effort is normal.     Breath sounds: Normal breath sounds.  Abdominal:     General: Abdomen is flat. Bowel sounds are normal.     Palpations: Abdomen is soft.  Musculoskeletal:        General: Normal range of motion.     Cervical back: Normal range of motion and neck supple.  Skin:    General: Skin is warm.     Capillary Refill: Capillary refill takes less than 2 seconds.  Neurological:     General: No focal deficit present.     Mental Status: She is alert.     Comments: Pt knows her name and that she's in the hospital.  She is moving all 4 extremities.  She is a poor historian and is tangential in thought.  Psychiatric:        Speech: Speech is delayed and tangential.     ED Results / Procedures / Treatments   Labs (all labs ordered are listed, but only abnormal results are displayed) Labs Reviewed  COMPREHENSIVE METABOLIC PANEL - Abnormal; Notable for the following components:      Result Value   Sodium 134 (*)    CO2 17 (*)    Glucose, Bld 108 (*)    Creatinine, Ser 1.09 (*)    GFR, Estimated 52 (*)    Anion gap 19 (*)    All other components within normal limits   CBC WITH DIFFERENTIAL/PLATELET - Abnormal; Notable for the following components:   HCT 34.6 (*)    All other components within normal limits  URINALYSIS, W/ REFLEX TO CULTURE (INFECTION SUSPECTED) - Abnormal; Notable for the following components:   Hgb urine dipstick SMALL (*)    Protein, ur 100 (*)    All other components within normal limits  CBG MONITORING,  ED - Abnormal; Notable for the following components:   Glucose-Capillary 159 (*)    All other components within normal limits  I-STAT CHEM 8, ED - Abnormal; Notable for the following components:   Potassium 3.3 (*)    Glucose, Bld 159 (*)    TCO2 14 (*)    All other components within normal limits  MAGNESIUM  TSH  TROPONIN I (HIGH SENSITIVITY)  TROPONIN I (HIGH SENSITIVITY)    EKG EKG Interpretation  Date/Time:  Tuesday February 11 2023 13:15:41 EDT Ventricular Rate:  69 PR Interval:  178 QRS Duration: 90 QT Interval:  485 QTC Calculation: 520 R Axis:   -29 Text Interpretation: Sinus rhythm Borderline left axis deviation Probable anteroseptal infarct, old Minimal ST depression, lateral leads Prolonged QT interval qtc more prolonged Confirmed by Jacalyn Lefevre [phone]) on 02/11/2023 3:14:12 PM  Radiology DG Chest Portable 1 View  Result Date: 02/11/2023 CLINICAL DATA:  Possible seizure versus syncopal episode. EXAM: PORTABLE CHEST 1 VIEW COMPARISON:  Chest radiograph 01/17/2023 FINDINGS: The heart is borderline enlarged. The upper mediastinal contours are prominent, likely at least in part due to the known ascending thoracic aortic aneurysm seen on prior CT. There is no focal consolidation or pulmonary edema. There is no pleural effusion or pneumothorax. Ill-defined opacity in the right suprahilar region is unchanged corresponding to the ground-glass nodular opacity seen on prior CT. There is no acute osseous abnormality. IMPRESSION: 1. No radiographic evidence of acute cardiopulmonary process. 2. Prominent mediastinal contours  likely at least in part due to the known ascending thoracic aortic aneurysm. 3. Ill-defined right suprahilar opacity corresponding to the known ground-glass nodular lesion seen on prior CT. Electronically Signed   By: Lesia Hausen M.D.   On: 02/11/2023 14:56   CT HEAD WO CONTRAST  Result Date: 02/11/2023 CLINICAL DATA:  Seizure EXAM: CT HEAD WITHOUT CONTRAST TECHNIQUE: Contiguous axial images were obtained from the base of the skull through the vertex without intravenous contrast. RADIATION DOSE REDUCTION: This exam was performed according to the departmental dose-optimization program which includes automated exposure control, adjustment of the mA and/or kV according to patient size and/or use of iterative reconstruction technique. COMPARISON:  CT head 04/28/22, MR Head 08/01/22 FINDINGS: Brain: No evidence of acute infarction, hemorrhage, hydrocephalus, extra-axial collection or mass lesion/mass effect. Sequela of moderate chronic microvascular ischemic change. Redemonstrated chronic right parietal lobe infarct. Vascular: No hyperdense vessel or unexpected calcification. Skull: Normal. Negative for fracture or focal lesion. Sinuses/Orbits: No middle ear or mastoid effusion paranasal sinuses are clear. Bilateral lens replacement. Orbits are otherwise unremarkable. Other: None. IMPRESSION: 1. No acute intracranial abnormality. 2. Sequela of moderate chronic microvascular ischemic change and chronic right parietal lobe infarct. Electronically Signed   By: Lorenza Cambridge M.D.   On: 02/11/2023 13:24    Procedures Procedures    Medications Ordered in ED Medications  0.9 %  sodium chloride infusion ( Intravenous New Bag/Given 02/11/23 1437)  LORazepam (ATIVAN) 2 MG/ML injection (has no administration in time range)  losartan (COZAAR) tablet 100 mg (has no administration in time range)  nebivolol (BYSTOLIC) tablet 10 mg (has no administration in time range)  FLUoxetine (PROZAC) tablet 40 mg (has no  administration in time range)  levothyroxine (SYNTHROID) tablet 125 mcg (has no administration in time range)  albuterol (VENTOLIN HFA) 108 (90 Base) MCG/ACT inhaler 2 puff (has no administration in time range)  enoxaparin (LOVENOX) injection 40 mg (has no administration in time range)  sodium chloride flush (NS) 0.9 % injection  3 mL (has no administration in time range)  acetaminophen (TYLENOL) tablet 650 mg (has no administration in time range)    Or  acetaminophen (TYLENOL) suppository 650 mg (has no administration in time range)  polyethylene glycol (MIRALAX / GLYCOLAX) packet 17 g (has no administration in time range)  levETIRAcetam (KEPPRA) IVPB 1000 mg/100 mL premix (0 mg Intravenous Stopped 02/11/23 1437)    ED Course/ Medical Decision Making/ A&P                             Medical Decision Making Amount and/or Complexity of Data Reviewed Labs: ordered. Radiology: ordered.  Risk Prescription drug management. Decision regarding hospitalization.   This patient presents to the ED for concern of unresponsive episode, this involves an extensive number of treatment options, and is a complaint that carries with it a high risk of complications and morbidity.  The differential diagnosis includes syncope, seizure, electrolyte abn, arrhythmia   Co morbidities that complicate the patient evaluation  CVA, hypothyroidism, hld, htn, spinal stenosis, afib, gerd, anxiety, vfib arrest (after tikosyn for afib), ascending aortic aneurysm (followed by CTS) and mild dementia   Additional history obtained:  Additional history obtained from epic chart review External records from outside source obtained and reviewed including EMS report/husband   Lab Tests:  I Ordered, and personally interpreted labs.  The pertinent results include:  cbc nl, cmp nl, mg 2, trop nl   Imaging Studies ordered:  I ordered imaging studies including cxr, ct head  I independently visualized and interpreted  imaging which showed  CXR: No radiographic evidence of acute cardiopulmonary process.  2. Prominent mediastinal contours likely at least in part due to the  known ascending thoracic aortic aneurysm.  3. Ill-defined right suprahilar opacity corresponding to the known  ground-glass nodular lesion seen on prior CT.  CT head: No acute intracranial abnormality.  2. Sequela of moderate chronic microvascular ischemic change and  chronic right parietal lobe infarct.   I agree with the radiologist interpretation   Cardiac Monitoring:  The patient was maintained on a cardiac monitor.  I personally viewed and interpreted the cardiac monitored which showed an underlying rhythm of: nsr   Medicines ordered and prescription drug management:  I ordered medication including ativan and keppra  for seizure  Reevaluation of the patient after these medicines showed that the patient improved I have reviewed the patients home medicines and have made adjustments as needed   Test Considered:  ct   Critical Interventions:  oxygen   Consultations Obtained:  I requested consultation with the neurology (Dr. Otelia Limes),  and discussed lab and imaging findings as well as pertinent plan - he will see pt in consult; he ordered a stat EEG. Pt d/w Dr. Alinda Money ([facility]) for admission   Problem List / ED Course:  Seizure:  While here, pt had a seizure.  HR went up to to 160s.  Husband came out to the hall and asked for help.  EMS was walking by and noted that she was seizing and was not breathing.  They did start bagging her.  I was called to the room.  She was still seizing when I arrived, but was starting to stop.  Ativan and keppra ordered.  Pt now breathing on her own.  BS nl.  HR back to nl.  Seizure is new onset.  Neuro consulted.  EEG ordered.  MRI ordered. QTC prolonged:  mg and k nl today.  Reevaluation:  After the interventions noted above, I reevaluated the patient and found that they have  :improved   Social Determinants of Health:  Lives at home   Dispostion:  After consideration of the diagnostic results and the patients response to treatment, I feel that the patent would benefit from admission.          Final Clinical Impression(s) / ED Diagnoses Final diagnoses:  Seizure  QT prolongation    Rx / DC Orders ED Discharge Orders     None         Jacalyn Lefevre, MD 02/11/23 1622

## 2023-02-11 NOTE — Progress Notes (Signed)
EEG complete - results pending 

## 2023-02-11 NOTE — ED Notes (Signed)
Pt is all over bed, scratching self, yelling and striking at staff. Unable to obtain vitals or give medications due. Hospitalist paged.

## 2023-02-11 NOTE — ED Notes (Signed)
Pt medicated, is now calm & sleeping. Room by nurses' station. Vitals obtained - stable. No acute distress noted. Unable to give Keppra ordered at this time due to pt bending arm continuously where IV is. Unable to obtain new IV without patient striking at staff.

## 2023-02-11 NOTE — Consult Note (Addendum)
Neurology Consultation    Reason for Consult: Seizure   CC: AMS   HISTORY OF PRESENT ILLNESS   HPI  History is obtained from:chart review, husband at bedside   Marissa [name] is a 77 y.o. female with medical history significant of anemia, prior strokes (although husband, at bedside, is unable to call specific stroke symptoms prompting evaluation), anxiety, depression, dementia, asthma, hypertension, hypothyroidism, CVA, cardiac arrest during Tikosyn initiation, chronic fatigue, fibromyalgia, atrial fibrillation, prolonged QT syndrome, thoracic aortic aneurysm presenting following an unresponsive episode.  Her issues began earlier today when she complained incessantly of right hand pain when suddenly her eyes rolled back, she was foaming at the mouth, and shaking diffusely although her movements were not large in amplitude, per husband.  She was not responsive and thus he called EMS at that time.  Apparently she improved to the point that she could walk to the stretcher with EMS and was back to baseline aside from some agitation.  While in the emergency room, she had a witnessed tonic-clonic seizure with respiratory changes prompting BVM use and then went right back to breathing on her own. Patient has had one signle seizure episode previously during her sleep of reportedly idiopathic etiology 25 years ago and has never been on seizure medication.   Her baseline, per husband, is with significant short-term memory loss and word finding difficulties.  She will frequently call items by the incorrect names but long-term memory and facial recognition intact.  The patient does not drive.  She is followed by a neurologist for her dementia.  [id number] to obtain due to altered mental status.   PAST MEDICAL HISTORY    Past Medical History:  Past Medical History:  Diagnosis Date   Anxiety    Arthritis    Asthma    Bicuspid aortic valve 05/2017   Likely functional bicuspid aortic valve with  sclerosis and no stenosis.   Bursitis    Cardiac arrest    x 2 (within an hour) June 2023   Cataract    mild   Cognitive change    Dyspnea    occasional   Dysrhythmia    A-fib   Dysthymia    GERD (gastroesophageal reflux disease)    Hemorrhoids    Hx of ulcerative colitis    per dr Lovell Sheehan as per pt.   Hyperlipidemia    Hypothyroidism    Labile hypertension    managed - labile.   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 Dorris Fetch)   Thyroid disease     Family History  Problem Relation Age of Onset   Arthritis Mother    Leukemia Father    Heart disease Maternal Uncle    Diabetes Paternal Aunt    Breast cancer Maternal Grandmother    Heart disease Maternal Grandfather    Breast cancer Paternal Grandmother    Colon cancer Neg Hx     Allergies:  Allergies  Allergen Reactions   Crestor [Rosuvastatin] Anaphylaxis, Hives, Shortness Of Breath, Itching, Photosensitivity, Nausea And Vomiting, Anxiety, Palpitations and Other (See Comments)   Duragesic-100 [Fentanyl] Shortness Of Breath and Rash   Pacerone [  Amiodarone] Nausea Only and Other (See Comments)    Ataxia  Weakness  Chills Dyspnea  Near syncope Chest pain   Tikosyn [Dofetilide] Other (See Comments)    Torsade and cardiac arrest during dofetilide initiation   Apresoline [Hydralazine] Other (See Comments)    Weakness  Sweats Skin redness   Hctz [Hydrochlorothiazide] Other (See Comments)    Hyponatremia   Oxycontin [Oxycodone] Other (See Comments)    Unknown reaction   Amoxil [Amoxicillin] Diarrhea, Swelling and Rash    Rash to vaginal area, including swelling    Codeine Other (See Comments)    Hyperactivity    Conjugated  Estrogens Itching and Rash   Erythromycin Rash    Reaction to E-mycin Has done ok with other medications in this class   Feldene [Piroxicam] Itching and Rash    Reaction to brand name Feldene    Social History:   reports that she has never smoked. She has never been exposed to tobacco smoke. She has never used smokeless tobacco. She reports current alcohol use of about 7.0 standard drinks of alcohol per week. She reports that she does not use drugs.    Medications (Not in a hospital admission)   EXAMINATION    Current vital signs:    02/11/2023    6:15 PM 02/11/2023    6:00 PM 02/11/2023    5:00 PM  Vitals with BMI  Systolic 148 149 161  Diastolic 136 111 096  Pulse 67 66 66   Examination:  GENERAL: Awake, alert in NAD. Disheveled, with obvious visual hallucinations, grabbing at air in front of her with right hand, biting pulse oximetry monitor and nasal canula  HEENT: - Normocephalic and atraumatic, dry mm, no lymphadenopathy, no Thyromegally LUNGS - Clear to auscultation bilaterally CV - S1S2 RRR, equal pulses bilaterally. ABDOMEN - Soft, nontender, nondistended with normoactive BS Ext: warm, well perfused, intact peripheral pulses, no pedal edema  NEURO:  Mental Status: AA&Ox1 to person only.  Language: speech is not dysarthric but is sparse. Mildly agitated. Frequent fluent but nonsensical speech.  Impaired naming, impaired repetition, impaired comprehension. Speech content is non-sensical.  Cranial Nerves:  II: PERRL. Blink to threat unreliable.  III, IV, VI: EOM intact. No ptosis.  V: Sensation appears intact V1-3 symmetrically  VII: no facial asymmetry   VIII: hearing intact to voice IX, X: Palate elevates symmetrically. Phonation is normal.  EA:[id number] shrug 5/5 and symmetrical  XII: tongue is midline without fasciculations. Motor:  At least 4/5 throughout, symmetrical  Reflexes: 2+ except BL cross adductor reflexes patellae  Sensation: Grossly intact,  otherwise difficult to assess  Tone is normal and bulk is normal  Coordination: no obvious ataxia, no abnormal movements  Gait- deferred  LABS   I have reviewed labs in epic and the results pertinent to this consultation are:  Lab Results  Component Value Date   LDLCALC 107 (H) 08/20/2022   Lab Results  Component Value Date   ALT 20 02/11/2023   AST 34 02/11/2023   ALKPHOS 55 02/11/2023   BILITOT 1.1 02/11/2023   Lab Results  Component Value Date   HGBA1C 5.1 08/07/2021   Lab Results  Component Value Date   WBC 5.6 02/11/2023   HGB 14.3 02/11/2023   HCT 42.0 02/11/2023   MCV 87.8 02/11/2023   PLT 219 02/11/2023   Lab Results  Component Value Date   VITAMINB12 926 (H) 11/12/2022   Lab Results  Component Value Date   FOLATE 9.5 05/23/2022  Lab Results  Component Value Date   NA 137 02/11/2023   K 3.3 (L) 02/11/2023   CL 104 02/11/2023   CO2 17 (L) 02/11/2023    DIAGNOSTIC IMAGING/PROCEDURES   I have reviewed the images obtained:, as below    CT-head reveals sequela of moderate chronic microvascular ischemic change. Redemonstrated chronic right parietal lobe infarct.  MRI Brain from 07/2022 1. No evidence of acute intracranial abnormality. 2. Known small chronic cortical/subcortical infarct within the right parietal lobe (at the junction of the right MCA and PCA vascular territories). 3. Redemonstrated small chronic cortical infarct within the left occipital lobe (PCA vascular territory). 4. Background moderate chronic small vessel ischemic changes within the cerebral white matter, progressed from the prior MRI of 08/06/2021. 5. Chronic microhemorrhages within the right frontal and parietal lobes, increased in number from the prior MRI. 6. Mild-to-moderate generalized cerebral atrophy. Mild cerebellar atrophy.  ASSESSMENT/PLAN    Assessment: Marissa [name] is a 77 y.o. female with medical history significant of anemia, chronic right parietal and  left occipital ischemic infarcts, anxiety, depression, dementia, asthma, hypertension, hypothyroidism, CVA, cardiac arrest during Tikosyn initiation, chronic fatigue, fibromyalgia, atrial fibrillation, prolonged QT syndrome and thoracic aortic aneurysm presenting after an unresponsive episode followed by witnessed GTC seizure in the ED.  - Examination is limited in setting of likely postictal state/altered mentation.  - Her previous MRI from 07/2022 shows significant cerebral atrophy, moderate small vessel ischemic disease, left occipital ischemic infarct, and right parietal ischemic infarction.   - It is felt likely that seizure onset most likely secondary to a seizure focus due to neurodegenerative disease versus the old strokes seen on imaging.  - Will need to rule out other toxic or metabolic etiologies.   - Currently, no nuchal rigidity to suggest a meningitis. - TSH level is elevated. Possibly clinically hypothyroid.   Impression: Second seizure of lifetime. Etiology neurodegenerative versus other provoked seizure (such as brain mass, currently being worked up for lung mass of undetermined cytology/not definitively malignant, repeat scans to follow) versus developing poststroke seizure disorder  Recommendations: - LTM EEG monitoring overnight - Loaded with 1 g Keppra, low threshold to load another 2 g should another seizure occur  - Maintenance Keppra 500 mg BID  - When able, MRI brain with and without contrast to rule out underlying mass lesion, new strokes, or other structural causes  - Seizure precautions - This patient should not drive and has not been since 2022  - Primary team to determine etiology for her elevated TSH with possible changes to her synthroid dosing.    -- Sanjuana Letters, PA-C Neurology Department   I have seen and examined the patient. I have discussed the assessment and recommendations with the Neurology PA and have made amendations as needed. 77 year old female  presenting with second seizure of lifetime. Exam reveals confusion and mild agitation with behavioral changes most consistent with visual hallucinations. Most likely etiology for her confusion is postictal state. Recommendations as above.  Electronically signed: Dr. Caryl Pina

## 2023-02-11 NOTE — ED Triage Notes (Signed)
Pt arrived via ems for possible seizure vs. Syncopal episode pt reports that she feels weak with left arm heaviness pt poor historian alert and oriented to self

## 2023-02-11 NOTE — H&P (Signed)
History and Physical   Marissa [name] ZOX:[id number] DOB: 01/09/1946 DOA: 02/11/2023  PCP: Myrlene Broker, MD   Patient coming from: Home  Chief Complaint: Unresponsive episode  HPI: Marissa [name] is a 77 y.o. female with medical history significant of anemia, anxiety, depression, dementia, asthma, hypertension, hypothyroidism, CVA, cardiac arrest during Tikosyn initiation, chronic fatigue, fibromyalgia, atrial fibrillation, prolonged QT syndrome, thoracic aortic aneurysm presenting following an unresponsive episode.  History obtained with assistance of chart review and family.  Patient was with her husband at home and had an episode of unresponsiveness while sitting in a chair.  She first complained of right hand pain and then he states that her eyes rolled back and she became unresponsive.  Denies any shaking activity.  He was able to lower her to the floor and called EMS.  She was unresponsive for around 15 minutes and on EMS arrival was alert but agitated.  She improved to near baseline in the ED but had no memory of the event.  Following this patient had a witnessed seizure in the ED.  Patient has chronic issues with itching.  She has been reporting on and off leading to to have a bowel movement. She denies fevers, chills, chest pain, shortness of breath, abdominal pain, nausea, vomiting.  ED Course: Vital signs in the ED notable for blood pressure in the 110s to 170 systolic.  Lab workup included CMP with sodium 134, bicarb 17 gap 19.  CBC within normal limits.  Troponin normal.  TSH pending.  Urinalysis pending.  Chest x-ray with no acute normality but did show chronic changes consistent with known thoracic aortic aneurysm and known right-sided nodular lesion.  CT head showed no acute abnormality.  Magnesium normal.  MR brain pending.  Patient received Keppra and IV fluids in the ED.  Neurology consulted recommended MRI brain, EEG, Keppra.  Review of Systems: As per HPI otherwise  all other systems reviewed and are negative.  Past Medical History:  Diagnosis Date   Anxiety    Arthritis    Asthma    Bicuspid aortic valve 05/2017   Likely functional bicuspid aortic valve with sclerosis and no stenosis.   Bursitis    Cardiac arrest    x 2 (within an hour) June 2023   Cataract    mild   Cognitive change    Dyspnea    occasional   Dysrhythmia    A-fib   Dysthymia    GERD (gastroesophageal reflux disease)    Hemorrhoids    Hx of ulcerative colitis    per dr Lovell Sheehan as per pt.   Hyperlipidemia    Hypothyroidism    Labile hypertension    managed - labile.   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 Dorris Fetch)   Thyroid disease     Past Surgical History:  Procedure Laterality Date   ABDOMINAL HYSTERECTOMY     BREAST EXCISIONAL BIOPSY Left    BUNIONECTOMY     Cardiac Event Monitor  07/2017   Overall relatively normal.  Normal sinus rhythm with rare bradycardia and tachycardia.  Heart rate ranged from 55-110 bpm.  Occasional PACs and PVCs, every single 1 was felt.  No arrhythmias other than one short 4 beat run of PACs.   COLONOSCOPY  02/04/2005   all normal    FOOT SURGERY     3 pins in toes    HAND SURGERY     left thumb joint resection   LOOP RECORDER INSERTION N/A 08/09/2021   Procedure: LOOP RECORDER INSERTION;  Surgeon: Lanier Prude, MD;  Location: [facility];  Service: Cardiovascular;  Laterality: N/A;   nasal revision     NM MYOVIEW LTD  07/2017   LOW RISK study.  No ischemia or infarction.  EF greater than 65%.    SINUS IRRIGATION     TONSILLECTOMY AND ADENOIDECTOMY     TRANSTHORACIC ECHOCARDIOGRAM  06/'1, 8/'19   a) Moderate LVH. Normal EF 60-65%.  Normal diastolic parameters. --> Difficult to fully visualize the aortic valve. Cannot exclude bicuspid valve. Mild aortic stenosis noted. No PFO. Mildly dilated left atrium. Trivial MR. No comment on mitral valve prolapse. Moderately dilated ascending aorta.;; b) F/u Echo To evaluate the aortic valve. -- Bicuspid AoV - mildly thickened / calcified. - No stenosis   TRANSTHORACIC ECHOCARDIOGRAM  08/08/2021   EF 60 to 65%.  Normal diastolic parameters?.   ?  Functional bicuspid aortic valve with R&L cusp sommisure.  Very sclerotic.  Stable gradients ~mean gradient 10 mmHg. Very Mild stenosis. Aorta dilated to 42 mm.  Stable.  RAP 3 mmHg   TRANSTHORACIC ECHOCARDIOGRAM  08/15/2020   EF 60 to 65%.  No R WMA.  GRII DD.  Mild Aortic Stenosis (mean gradient 10 mmHg).  Moderate to severe aortic root dilation (45 mm) normal IVC.  (Stable compared to February 2020)    VIDEO BRONCHOSCOPY WITH ENDOBRONCHIAL NAVIGATION N/A 01/17/2023   Procedure: VIDEO BRONCHOSCOPY WITH ENDOBRONCHIAL NAVIGATION;  Surgeon: Loreli Slot, MD;  Location: [facility] OR;  Service: Thoracic;  Laterality: N/A;    Social History  reports that she has never smoked. She has never been exposed to tobacco smoke. She has never used smokeless tobacco. She reports current alcohol use of about 7.0 standard drinks of alcohol per week. She reports that she does not use drugs.  Allergies  Allergen Reactions   Amiodarone Other (See Comments)    Nausea, ataxia, weakness and chills.  Chest pain, dyspnea; near syncope   Dofetilide Other (See Comments)    Torsades and cardiac arrest during dofetilide initiation   Fentanyl Shortness Of Breath and Rash   Rosuvastatin Anaphylaxis, Anxiety, Hives, Itching, Nausea And Vomiting, Other (See Comments), Palpitations, Photosensitivity and Shortness Of Breath   Hctz [Hydrochlorothiazide] Other (See Comments)    HypoNatremia   Hydralazine Other (See Comments)    Weak, sweats, red skin   Other Diarrhea     Magnesium    Oxycodone Other (See Comments)   Amoxicillin Diarrhea, Swelling and Rash    Severe diarrhea, rash to vaginal area with swelling    Codeine Other (See Comments)    makes her hyper   Conjugated Estrogens Itching and Rash   Erythromycin Rash    had a rash with emycin, has done ok with other meds in it's class   Piroxicam Itching and Rash    Feldene    Family History  Problem Relation Age of Onset   Arthritis Mother    Leukemia Father    Heart disease Maternal Uncle    Diabetes Paternal Aunt    Breast cancer Maternal Grandmother    Heart  disease Maternal Grandfather    Breast cancer Paternal Grandmother    Colon cancer Neg Hx   Reviewed on admission  Prior to Admission medications   Medication Sig Start Date End Date Taking? Authorizing Provider  acetaminophen (TYLENOL) 325 MG tablet Take 325 mg by mouth every 6 (six) hours as needed for moderate pain.    [provider]  albuterol (VENTOLIN HFA) 108 (90 Base) MCG/ACT inhaler INHALE 2 PUFFS INTO THE LUNGS EVERY 6 HOURS AS NEEDED FOR WHEEZING OR SHORTNESS OF BREATH 01/21/22   Myrlene Broker, MD  ALPRAZolam Prudy Feeler) 0.5 MG tablet Take 1 tablet (0.5 mg total) by mouth 2 (two) times daily as needed for anxiety. 11/26/22   Myrlene Broker, MD  cloNIDine (CATAPRES) 0.2 MG tablet Take 0.1 mg by mouth 2 (two) times daily as needed (systolic over 160). if blood pressure is great than 160 systolic for more than an hour 12/17/22   Marykay Lex, MD  Eszopiclone 3 MG TABS Take 1 tablet (3 mg total) by mouth at bedtime. 10/07/22   Myrlene Broker, MD  feeding supplement, GLUCERNA SHAKE, (GLUCERNA SHAKE) LIQD Take 237 mLs by mouth daily as needed (if not eating meals).    [provider]  FLUoxetine (PROZAC) 20 MG tablet Take 2 tablets (40 mg total) by mouth daily. 02/05/23   Myrlene Broker, MD  HYDROcodone-acetaminophen (NORCO/VICODIN) 5-325 MG tablet Take 1 tablet by mouth daily as needed for  moderate pain. 01/02/23   Myrlene Broker, MD  latanoprost (XALATAN) 0.005 % ophthalmic solution Place 1 drop into both eyes at bedtime. 08/16/22   [provider]  levothyroxine (SYNTHROID) 125 MCG tablet TAKE 1 TABLET(125 MCG) BY MOUTH DAILY 09/16/22   Myrlene Broker, MD  losartan (COZAAR) 100 MG tablet Take 1 tablet (100 mg total) by mouth daily. 01/23/23   Marykay Lex, MD  magnesium oxide (MAG-OX) 400 MG tablet Take 1 tablet (400 mg total) by mouth 2 (two) times daily. 05/15/22   Myrlene Broker, MD  metoprolol tartrate (LOPRESSOR) 25 MG tablet Take 25 mg tablet as needed if heart rate is above 100 after waiting for 10 minutes , if systolic blood pressure is less than 110 do not take metoprolol. 03/13/22   Marykay Lex, MD  Multiple Vitamin (MULTIVITAMIN WITH MINERALS) TABS tablet Take 1 tablet by mouth daily. 05/24/22   Hongalgi, Maximino Greenland, MD  nebivolol (BYSTOLIC) 10 MG tablet TAKE 1 ZDGUYQ(03 MG) BY MOUTH DAILY AT 8 PM 09/16/22   Marykay Lex, MD  Respiratory Therapy Supplies (FLUTTER) DEVI Use as directed 11/18/16   Nyoka Cowden, MD  triamcinolone cream (KENALOG) 0.1 % Apply 1 Application topically 2 (two) times daily. Patient taking differently: Apply 1 Application topically 2 (two) times daily as needed (irritation). 06/11/22   Myrlene Broker, MD    Physical Exam: Vitals:   02/11/23 1315 02/11/23 1400 02/11/23 1415 02/11/23 1430  BP: (!) 145/52 (!) 175/81 (!) 165/56   Pulse:   79 79  Resp: (!) 25 18 16  (!) 21  Temp:      SpO2:   100% 100%  Weight:      Height:        Physical Exam Constitutional:      General: She is not in acute distress.    Appearance: Normal appearance.  HENT:     Head: Normocephalic and atraumatic.     Mouth/Throat:     Mouth: Mucous membranes are moist.  Pharynx: Oropharynx is clear.  Eyes:     Extraocular Movements: Extraocular movements intact.     Pupils: Pupils are equal, round, and reactive to light.   Cardiovascular:     Rate and Rhythm: Normal rate and regular rhythm.     Pulses: Normal pulses.     Heart sounds: Normal heart sounds.  Pulmonary:     Effort: Pulmonary effort is normal. No respiratory distress.     Breath sounds: Normal breath sounds.  Abdominal:     General: Bowel sounds are normal. There is no distension.     Palpations: Abdomen is soft.     Tenderness: There is no abdominal tenderness.  Musculoskeletal:        General: No swelling or deformity.  Skin:    General: Skin is warm and dry.  Neurological:     General: No focal deficit present.     Mental Status: Mental status is at baseline.     Comments: Oh certain oriented x 2, to person and place.  This is her baseline.    Labs on Admission: I have personally reviewed following labs and imaging studies  CBC: Recent Labs  Lab 02/11/23 1300 02/11/23 1422  WBC 5.6  --   NEUTROABS 3.9  --   HGB 12.1 14.3  HCT 34.6* 42.0  MCV 87.8  --   PLT 219  --     Basic Metabolic Panel: Recent Labs  Lab 02/11/23 1300 02/11/23 1422  NA 134* 137  K 3.9 3.3*  CL 98 104  CO2 17*  --   GLUCOSE 108* 159*  BUN 16 17  CREATININE 1.09* 1.00  CALCIUM 9.7  --   MG 2.0  --     GFR: Estimated Creatinine Clearance: 40 mL/min (by C-G formula based on SCr of 1 mg/dL).  Liver Function Tests: Recent Labs  Lab 02/11/23 1300  AST 34  ALT 20  ALKPHOS 55  BILITOT 1.1  PROT 6.8  ALBUMIN 4.2    Urine analysis:    Component Value Date/Time   COLORURINE YELLOW 02/11/2023 1534   APPEARANCEUR CLEAR 02/11/2023 1534   LABSPEC 1.012 02/11/2023 1534   PHURINE 7.0 02/11/2023 1534   GLUCOSEU NEGATIVE 02/11/2023 1534   HGBUR SMALL (A) 02/11/2023 1534   BILIRUBINUR NEGATIVE 02/11/2023 1534   BILIRUBINUR 1+ 12/17/2011 1704   KETONESUR NEGATIVE 02/11/2023 1534   PROTEINUR 100 (A) 02/11/2023 1534   UROBILINOGEN 0.2 12/17/2011 1704   NITRITE NEGATIVE 02/11/2023 1534   LEUKOCYTESUR NEGATIVE 02/11/2023 1534     Radiological Exams on Admission: DG Chest Portable 1 View  Result Date: 02/11/2023 CLINICAL DATA:  Possible seizure versus syncopal episode. EXAM: PORTABLE CHEST 1 VIEW COMPARISON:  Chest radiograph 01/17/2023 FINDINGS: The heart is borderline enlarged. The upper mediastinal contours are prominent, likely at least in part due to the known ascending thoracic aortic aneurysm seen on prior CT. There is no focal consolidation or pulmonary edema. There is no pleural effusion or pneumothorax. Ill-defined opacity in the right suprahilar region is unchanged corresponding to the ground-glass nodular opacity seen on prior CT. There is no acute osseous abnormality. IMPRESSION: 1. No radiographic evidence of acute cardiopulmonary process. 2. Prominent mediastinal contours likely at least in part due to the known ascending thoracic aortic aneurysm. 3. Ill-defined right suprahilar opacity corresponding to the known ground-glass nodular lesion seen on prior CT. Electronically Signed   By: Lesia Hausen M.D.   On: 02/11/2023 14:56   CT HEAD WO CONTRAST  Result Date:  02/11/2023 CLINICAL DATA:  Seizure EXAM: CT HEAD WITHOUT CONTRAST TECHNIQUE: Contiguous axial images were obtained from the base of the skull through the vertex without intravenous contrast. RADIATION DOSE REDUCTION: This exam was performed according to the departmental dose-optimization program which includes automated exposure control, adjustment of the mA and/or kV according to patient size and/or use of iterative reconstruction technique. COMPARISON:  CT head 04/28/22, MR Head 08/01/22 FINDINGS: Brain: No evidence of acute infarction, hemorrhage, hydrocephalus, extra-axial collection or mass lesion/mass effect. Sequela of moderate chronic microvascular ischemic change. Redemonstrated chronic right parietal lobe infarct. Vascular: No hyperdense vessel or unexpected calcification. Skull: Normal. Negative for fracture or focal lesion. Sinuses/Orbits: No middle  ear or mastoid effusion paranasal sinuses are clear. Bilateral lens replacement. Orbits are otherwise unremarkable. Other: None. IMPRESSION: 1. No acute intracranial abnormality. 2. Sequela of moderate chronic microvascular ischemic change and chronic right parietal lobe infarct. Electronically Signed   By: Lorenza Cambridge M.D.   On: 02/11/2023 13:24    EKG: Sinus rhythm at 70 bpm.  QTc 505.  Assessment/Plan Principal Problem:   Seizures Active Problems:   Prolonged QT interval   Dementia   Persistent atrial fibrillation (HCC): CHA2DS2-VASc Score = 7    Hypothyroidism   ANEMIA, IRON DEFICIENCY   Asthma   Fibromyalgia   Anxiety   HTN (hypertension)   Depressive disorder   Seizure > Patient presented with unresponsive episode concerning for seizure versus syncope at home.  More suspicious for seizure given period of confusion afterwards despite no tonic-clonic activity. > This was confirmed and patient had additional seizure in the ED which was witnessed. > Neurology consulted, patient given Keppra.  EEG and MRI ordered. - Appreciate neurology recommendations-continue Keppra - Follow-up MRI and EEG - Seizure precautions - Neurochecks  QTc prolongation > Known history of this with current QTc 505. - Avoid QT prolonging medications - EKG in a.m.  Hypertension - Continue home losartan  Dementia Anxiety Depression Chronic fatigue Fibromyalgia - Continue home fluoxetine, Lunesta, Xanax  Hypothyroidism - Continue home Synthroid  Asthma - Continue home as needed albuterol  Atrial fibrillation  > Has been prescribed Xarelto in the past but had not been taking at last visit with cardiology. - Reports no longer taking metoprolol - Resume home Xarelto  History of CVA - Noted  History of cardiac arrest when initiating Tikosyn - Noted  DVT prophylaxis: Xarelto Code Status:   DNR/DNI Family Communication:  Updated at bedside Disposition Plan:   Patient is  from:  Home  Anticipated DC to:  Home  Anticipated DC date:  1 to 3 days  Anticipated DC barriers: None  Consults called:  Neurology Admission status:  Observation, telemetry  Severity of Illness: The appropriate patient status for this patient is OBSERVATION. Observation status is judged to be reasonable and necessary in order to provide the required intensity of service to ensure the patient's safety. The patient's presenting symptoms, physical exam findings, and initial radiographic and laboratory data in the context of their medical condition is felt to place them at decreased risk for further clinical deterioration. Furthermore, it is anticipated that the patient will be medically stable for discharge from the hospital within 2 midnights of admission.    Synetta Fail MD [facility]  How to contact the [facility] Attending or Consulting provider 7A - 7P or covering provider during after hours 7P -7A, for this patient?   Check the care team in [facility] and look for a) attending/consulting [facility] provider listed and  b) the [facility] team listed Log into [email or web address] and use [place]'s universal password to access. If you do not have the password, please contact the hospital operator. Locate the [facility] provider you are looking for under [facility] and page to a number that you can be directly reached. If you still have difficulty reaching the provider, please page the [facility] (Director on Call) for the Hospitalists listed on amion for assistance.  02/11/2023, 4:22 PM

## 2023-02-12 ENCOUNTER — Observation Stay (HOSPITAL_COMMUNITY): Payer: Medicare Other

## 2023-02-12 ENCOUNTER — Encounter (HOSPITAL_COMMUNITY): Payer: Self-pay | Admitting: Internal Medicine

## 2023-02-12 DIAGNOSIS — J45909 Unspecified asthma, uncomplicated: Secondary | ICD-10-CM | POA: Diagnosis present

## 2023-02-12 DIAGNOSIS — I10 Essential (primary) hypertension: Secondary | ICD-10-CM | POA: Diagnosis present

## 2023-02-12 DIAGNOSIS — E876 Hypokalemia: Secondary | ICD-10-CM | POA: Diagnosis not present

## 2023-02-12 DIAGNOSIS — F03A4 Unspecified dementia, mild, with anxiety: Secondary | ICD-10-CM | POA: Diagnosis present

## 2023-02-12 DIAGNOSIS — G319 Degenerative disease of nervous system, unspecified: Secondary | ICD-10-CM | POA: Diagnosis not present

## 2023-02-12 DIAGNOSIS — K5190 Ulcerative colitis, unspecified, without complications: Secondary | ICD-10-CM | POA: Diagnosis present

## 2023-02-12 DIAGNOSIS — Z8674 Personal history of sudden cardiac arrest: Secondary | ICD-10-CM | POA: Diagnosis not present

## 2023-02-12 DIAGNOSIS — Z888 Allergy status to other drugs, medicaments and biological substances status: Secondary | ICD-10-CM | POA: Diagnosis not present

## 2023-02-12 DIAGNOSIS — Z66 Do not resuscitate: Secondary | ICD-10-CM | POA: Diagnosis present

## 2023-02-12 DIAGNOSIS — Z79890 Hormone replacement therapy (postmenopausal): Secondary | ICD-10-CM | POA: Diagnosis not present

## 2023-02-12 DIAGNOSIS — E785 Hyperlipidemia, unspecified: Secondary | ICD-10-CM | POA: Diagnosis present

## 2023-02-12 DIAGNOSIS — E039 Hypothyroidism, unspecified: Secondary | ICD-10-CM | POA: Diagnosis present

## 2023-02-12 DIAGNOSIS — G4089 Other seizures: Secondary | ICD-10-CM | POA: Diagnosis present

## 2023-02-12 DIAGNOSIS — F32A Depression, unspecified: Secondary | ICD-10-CM | POA: Diagnosis present

## 2023-02-12 DIAGNOSIS — Z8249 Family history of ischemic heart disease and other diseases of the circulatory system: Secondary | ICD-10-CM | POA: Diagnosis not present

## 2023-02-12 DIAGNOSIS — I4819 Other persistent atrial fibrillation: Secondary | ICD-10-CM | POA: Diagnosis present

## 2023-02-12 DIAGNOSIS — Z885 Allergy status to narcotic agent status: Secondary | ICD-10-CM | POA: Diagnosis not present

## 2023-02-12 DIAGNOSIS — I712 Thoracic aortic aneurysm, without rupture, unspecified: Secondary | ICD-10-CM | POA: Diagnosis present

## 2023-02-12 DIAGNOSIS — Z87892 Personal history of anaphylaxis: Secondary | ICD-10-CM | POA: Diagnosis not present

## 2023-02-12 DIAGNOSIS — Z79899 Other long term (current) drug therapy: Secondary | ICD-10-CM | POA: Diagnosis not present

## 2023-02-12 DIAGNOSIS — G9340 Encephalopathy, unspecified: Secondary | ICD-10-CM | POA: Diagnosis present

## 2023-02-12 DIAGNOSIS — F03A3 Unspecified dementia, mild, with mood disturbance: Secondary | ICD-10-CM | POA: Diagnosis present

## 2023-02-12 DIAGNOSIS — Z881 Allergy status to other antibiotic agents status: Secondary | ICD-10-CM | POA: Diagnosis not present

## 2023-02-12 DIAGNOSIS — D509 Iron deficiency anemia, unspecified: Secondary | ICD-10-CM | POA: Diagnosis present

## 2023-02-12 DIAGNOSIS — M797 Fibromyalgia: Secondary | ICD-10-CM | POA: Diagnosis present

## 2023-02-12 DIAGNOSIS — R569 Unspecified convulsions: Secondary | ICD-10-CM | POA: Diagnosis present

## 2023-02-12 LAB — CBC
HCT: 33.6 % — ABNORMAL LOW (ref 36.0–46.0)
Hemoglobin: 11.7 g/dL — ABNORMAL LOW (ref 12.0–15.0)
MCH: 31.1 pg (ref 26.0–34.0)
MCHC: 34.8 g/dL (ref 30.0–36.0)
MCV: 89.4 fL (ref 80.0–100.0)
Platelets: 179 10*3/uL (ref 150–400)
RBC: 3.76 MIL/uL — ABNORMAL LOW (ref 3.87–5.11)
RDW: 14.4 % (ref 11.5–15.5)
WBC: 6.5 10*3/uL (ref 4.0–10.5)
nRBC: 0 % (ref 0.0–0.2)

## 2023-02-12 LAB — COMPREHENSIVE METABOLIC PANEL
ALT: 22 U/L (ref 0–44)
AST: 38 U/L (ref 15–41)
Albumin: 4 g/dL (ref 3.5–5.0)
Alkaline Phosphatase: 59 U/L (ref 38–126)
Anion gap: 12 (ref 5–15)
BUN: 17 mg/dL (ref 8–23)
CO2: 22 mmol/L (ref 22–32)
Calcium: 9.2 mg/dL (ref 8.9–10.3)
Chloride: 103 mmol/L (ref 98–111)
Creatinine, Ser: 1.08 mg/dL — ABNORMAL HIGH (ref 0.44–1.00)
GFR, Estimated: 53 mL/min — ABNORMAL LOW (ref >60)
Glucose, Bld: 93 mg/dL (ref 70–99)
Potassium: 3.2 mmol/L — ABNORMAL LOW (ref 3.5–5.1)
Sodium: 137 mmol/L (ref 135–145)
Total Bilirubin: 1.1 mg/dL (ref 0.3–1.2)
Total Protein: 6.6 g/dL (ref 6.5–8.1)

## 2023-02-12 MED ORDER — POTASSIUM CHLORIDE CRYS ER 20 MEQ PO TBCR
40.0000 meq | EXTENDED_RELEASE_TABLET | ORAL | Status: AC
  Administered 2023-02-12 (×2): 40 meq via ORAL
  Filled 2023-02-12: qty 2

## 2023-02-12 MED ORDER — LORAZEPAM 2 MG/ML IJ SOLN
INTRAMUSCULAR | Status: AC
  Filled 2023-02-12: qty 1

## 2023-02-12 NOTE — Hospital Course (Signed)
Marissa [name] is a 77 y.o. female with medical history significant of anemia, anxiety, depression, dementia, asthma, hypertension, hypothyroidism, CVA, cardiac arrest during Tikosyn initiation, chronic fatigue, fibromyalgia, atrial fibrillation, prolonged QT syndrome, thoracic aortic aneurysm presenting following an unresponsive episode. Patient was with her husband at home and had an episode of unresponsiveness while sitting in a chair.  She first complained of right hand pain and then he states that her eyes rolled back and she became unresponsive.  Denies any shaking activity. Upon EMS arrival was alert but agitated. Following this patient had a witnessed seizure in the ED.

## 2023-02-12 NOTE — ED Notes (Addendum)
EEG tech at bedside attempting to place leads but patient is combative and striking at staff. Attempted redirection. Room near nurses' station. Safety sitter at bedside. Dr. Loney Loh paged multiple times for patient with no response.

## 2023-02-12 NOTE — ED Notes (Addendum)
Pt placed in soft wrist restraints on both arms. Pulses felt in all extremities. Call bell in reach. Vitals stable. No acute distress noted. Able to now give Keppra dose ordered through new IV placed.

## 2023-02-12 NOTE — ED Notes (Signed)
Help patient back up in the bed changed brief patient is resting with sitter at bedside

## 2023-02-12 NOTE — ED Notes (Addendum)
MRI has called and stated they cannot take pt on restraints. Dr. Loney Loh made aware.

## 2023-02-12 NOTE — Plan of Care (Signed)
  Problem: Safety: Goal: Non-violent Restraint(s) Outcome: Progressing   Problem: Elimination: Goal: Will not experience complications related to urinary retention Outcome: Progressing   Problem: Skin Integrity: Goal: Risk for impaired skin integrity will decrease Outcome: Progressing   Problem: Education: Goal: Knowledge of General Education information will improve Description: Including pain rating scale, medication(s)/side effects and non-pharmacologic comfort measures Outcome: Not Progressing   Problem: Health Behavior/Discharge Planning: Goal: Ability to manage health-related needs will improve Outcome: Not Progressing

## 2023-02-12 NOTE — ED Notes (Signed)
ED TO INPATIENT HANDOFF REPORT  ED Nurse Name and Phone #: Delice Bison, RN  S Name/Age/Gender Marissa [name] S Chrystal 77 y.o. female Room/Bed: 040C/040C  Code Status   Code Status: Full Code  Home/SNF/Other Home Patient oriented to: self Is this baseline? Yes   Triage Complete: Triage complete  Chief Complaint Seizures [R56.9]  Triage Note Pt arrived via ems for possible seizure vs. Syncopal episode pt reports that she feels weak with left arm heaviness pt poor historian alert and oriented to self   Allergies Allergies  Allergen Reactions   Crestor [Rosuvastatin] Anaphylaxis, Hives, Shortness Of Breath, Itching, Photosensitivity, Nausea And Vomiting, Anxiety, Palpitations and Other (See Comments)   Duragesic-100 [Fentanyl] Shortness Of Breath and Rash   Pacerone [Amiodarone] Nausea Only and Other (See Comments)    Ataxia  Weakness  Chills Dyspnea  Near syncope Chest pain   Tikosyn [Dofetilide] Other (See Comments)    Torsade and cardiac arrest during dofetilide initiation   Apresoline [Hydralazine] Other (See Comments)    Weakness  Sweats Skin redness   Hctz [Hydrochlorothiazide] Other (See Comments)    Hyponatremia   Oxycontin [Oxycodone] Other (See Comments)    Unknown reaction   Amoxil [Amoxicillin] Diarrhea, Swelling and Rash    Rash to vaginal area, including swelling    Codeine Other (See Comments)    Hyperactivity    Conjugated Estrogens Itching and Rash   Erythromycin Rash    Reaction to E-mycin Has done ok with other medications in this class   Feldene [Piroxicam] Itching and Rash    Reaction to brand name Feldene    Level of Care/Admitting Diagnosis ED Disposition     ED Disposition  Admit   Condition  --   Comment  Hospital Area: [facility] [[id number]]  Level of Care: Telemetry Medical [104]  May place patient in observation at [facility] or [facility] if equivalent level of care is available:: No  Covid Evaluation: Asymptomatic - no  recent exposure (last 10 days) testing not required  Diagnosis: Seizures [205091]  Admitting Physician: Synetta Fail [[id number]]  Attending Physician: Synetta Fail [[id number]]          B Medical/Surgery History Past Medical History:  Diagnosis Date   Anxiety    Arthritis    Asthma    Bicuspid aortic valve 05/2017   Likely functional bicuspid aortic valve with sclerosis and no stenosis.   Bursitis    Cardiac arrest    x 2 (within an hour) June 2023   Cataract    mild   Cognitive change    Dyspnea    occasional   Dysrhythmia    A-fib   Dysthymia    GERD (gastroesophageal reflux disease)    Hemorrhoids    Hx of ulcerative colitis    per dr Lovell Sheehan as per pt.   Hyperlipidemia    Hypothyroidism    Labile hypertension    managed - labile.   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 Dorris Fetch)   Thyroid disease    Past Surgical History:  Procedure Laterality Date   ABDOMINAL HYSTERECTOMY  BREAST EXCISIONAL BIOPSY Left    BUNIONECTOMY     Cardiac Event Monitor  07/2017   Overall relatively normal.  Normal sinus rhythm with rare bradycardia and tachycardia.  Heart rate ranged from 55-110 bpm.  Occasional PACs and PVCs, every single 1 was felt.  No arrhythmias other than one short 4 beat run of PACs.   COLONOSCOPY  02/04/2005   all normal    FOOT SURGERY     3 pins in toes    HAND SURGERY     left thumb joint resection   LOOP RECORDER INSERTION N/A 08/09/2021   Procedure: LOOP RECORDER INSERTION;  Surgeon: Lanier Prude, MD;  Location: [facility] INVASIVE [facility];  Service: Cardiovascular;  Laterality: N/A;   nasal revision     NM MYOVIEW LTD  07/2017   LOW RISK study.  No ischemia  or infarction.  EF greater than 65%.    SINUS IRRIGATION     TONSILLECTOMY AND ADENOIDECTOMY     TRANSTHORACIC ECHOCARDIOGRAM  06/'1, 8/'19   a) Moderate LVH. Normal EF 60-65%. Normal diastolic parameters. --> Difficult to fully visualize the aortic valve. Cannot exclude bicuspid valve. Mild aortic stenosis noted. No PFO. Mildly dilated left atrium. Trivial MR. No comment on mitral valve prolapse. Moderately dilated ascending aorta.;; b) F/u Echo To evaluate the aortic valve. -- Bicuspid AoV - mildly thickened / calcified. - No stenosis   TRANSTHORACIC ECHOCARDIOGRAM  08/08/2021   EF 60 to 65%.  Normal diastolic parameters?.   ?  Functional bicuspid aortic valve with R&L cusp sommisure.  Very sclerotic.  Stable gradients ~mean gradient 10 mmHg. Very Mild stenosis. Aorta dilated to 42 mm.  Stable.  RAP 3 mmHg   TRANSTHORACIC ECHOCARDIOGRAM  08/15/2020   EF 60 to 65%.  No R WMA.  GRII DD.  Mild Aortic Stenosis (mean gradient 10 mmHg).  Moderate to severe aortic root dilation (45 mm) normal IVC.  (Stable compared to February 2020)    VIDEO BRONCHOSCOPY WITH ENDOBRONCHIAL NAVIGATION N/A 01/17/2023   Procedure: VIDEO BRONCHOSCOPY WITH ENDOBRONCHIAL NAVIGATION;  Surgeon: Loreli Slot, MD;  Location: [facility] OR;  Service: Thoracic;  Laterality: N/A;     A IV Location/Drains/Wounds Patient Lines/Drains/Airways Status     Active Line/Drains/Airways     Name Placement date Placement time Site Days   Peripheral IV 02/11/23 20 G Left Antecubital 02/11/23  1304  Antecubital  1   Peripheral IV 02/12/23 20 G Anterior;Right Forearm 02/12/23  0020  Forearm  less than 1            Intake/Output Last 24 hours  Intake/Output Summary (Last 24 hours) at 02/12/2023 0856 Last data filed at 02/12/2023 0040 Gross per 24 hour  Intake 100.04 ml  Output --  Net 100.04 ml    Labs/Imaging Results for orders placed or performed during the hospital encounter of 02/11/23 (from the past 48 hour(s))   Comprehensive metabolic panel     Status: Abnormal   Collection Time: 02/11/23  1:00 PM  Result Value Ref Range   Sodium 134 (L) 135 - 145 mmol/L   Potassium 3.9 3.5 - 5.1 mmol/L   Chloride 98 98 - 111 mmol/L   CO2 17 (L) 22 - 32 mmol/L   Glucose, Bld 108 (H) 70 - 99 mg/dL    Comment: Glucose reference range applies only to samples taken after fasting for at least 8 hours.   BUN 16 8 - 23 mg/dL   Creatinine, Ser 1.61 (H) 0.44 -  1.00 mg/dL   Calcium 9.7 8.9 - 16.1 mg/dL   Total Protein 6.8 6.5 - 8.1 g/dL   Albumin 4.2 3.5 - 5.0 g/dL   AST 34 15 - 41 U/L   ALT 20 0 - 44 U/L   Alkaline Phosphatase 55 38 - 126 U/L   Total Bilirubin 1.1 0.3 - 1.2 mg/dL   GFR, Estimated 52 (L) >60 mL/min    Comment: (NOTE) Calculated using the CKD-EPI Creatinine Equation (2021)    Anion gap 19 (H) 5 - 15    Comment: ELECTROLYTES REPEATED TO VERIFY Performed at [facility], [address], [place], [place] [address]   CBC with Differential/Platelet     Status: Abnormal   Collection Time: 02/11/23  1:00 PM  Result Value Ref Range   WBC 5.6 4.0 - 10.5 K/uL   RBC 3.94 3.87 - 5.11 MIL/uL   Hemoglobin 12.1 12.0 - 15.0 g/dL   HCT 54.0 (L) 98.1 - 19.1 %   MCV 87.8 80.0 - 100.0 fL   MCH 30.7 26.0 - 34.0 pg   MCHC 35.0 30.0 - 36.0 g/dL   RDW 47.8 29.5 - 62.1 %   Platelets 219 150 - 400 K/uL   nRBC 0.0 0.0 - 0.2 %   Neutrophils Relative % 70 %   Neutro Abs 3.9 1.7 - 7.7 K/uL   Lymphocytes Relative 16 %   Lymphs Abs 0.9 0.7 - 4.0 K/uL   Monocytes Relative 9 %   Monocytes Absolute 0.5 0.1 - 1.0 K/uL   Eosinophils Relative 4 %   Eosinophils Absolute 0.2 0.0 - 0.5 K/uL   Basophils Relative 1 %   Basophils Absolute 0.0 0.0 - 0.1 K/uL   Immature Granulocytes 0 %   Abs Immature Granulocytes 0.02 0.00 - 0.07 K/uL    Comment: Performed at [facility], [address], [place], [place] [address]  Magnesium     Status: None   Collection Time: 02/11/23  1:00 PM  Result Value Ref Range    Magnesium 2.0 1.7 - 2.4 mg/dL    Comment: Performed at [facility], [address] [place], [place] [address]  Troponin I (High Sensitivity)     Status: None   Collection Time: 02/11/23  1:00 PM  Result Value Ref Range   Troponin I (High Sensitivity) 4 <18 ng/L    Comment: (NOTE) Elevated high sensitivity troponin I (hsTnI) values and significant  changes across serial measurements may suggest ACS but many other  chronic and acute conditions are known to elevate hsTnI results.  Refer to the "Links" section for chest pain algorithms and additional  guidance. Performed at [facility], [address], [place], [place] [address]   CBG monitoring, ED     Status: Abnormal   Collection Time: 02/11/23  2:16 PM  Result Value Ref Range   Glucose-Capillary 159 (H) 70 - 99 mg/dL    Comment: Glucose reference range applies only to samples taken after fasting for at least 8 hours.  I-stat chem 8, ED (not at [facility], [facility] or [facility])     Status: Abnormal   Collection Time: 02/11/23  2:22 PM  Result Value Ref Range   Sodium 137 135 - 145 mmol/L   Potassium 3.3 (L) 3.5 - 5.1 mmol/L   Chloride 104 98 - 111 mmol/L   BUN 17 8 - 23 mg/dL   Creatinine, Ser 8.41 0.44 - 1.00 mg/dL   Glucose, Bld 324 (H) 70 -  99 mg/dL    Comment: Glucose reference range applies only to samples taken after fasting for at least 8 hours.   Calcium, Ion 1.20 1.15 - 1.40 mmol/L   TCO2 14 (L) 22 - 32 mmol/L   Hemoglobin 14.3 12.0 - 15.0 g/dL   HCT 09.8 11.9 - 14.7 %  Urinalysis, w/ Reflex to Culture (Infection Suspected) -Urine, Clean Catch     Status: Abnormal   Collection Time: 02/11/23  3:34 PM  Result Value Ref Range   Specimen Source URINE, CLEAN CATCH    Color, Urine YELLOW YELLOW   APPearance CLEAR CLEAR   Specific Gravity, Urine 1.012 1.005 - 1.030   pH 7.0 5.0 - 8.0   Glucose, UA NEGATIVE NEGATIVE mg/dL   Hgb urine dipstick SMALL (A) NEGATIVE   Bilirubin Urine NEGATIVE NEGATIVE   Ketones, ur NEGATIVE  NEGATIVE mg/dL   Protein, ur 829 (A) NEGATIVE mg/dL   Nitrite NEGATIVE NEGATIVE   Leukocytes,Ua NEGATIVE NEGATIVE   RBC / HPF 0-5 0 - 5 RBC/hpf   WBC, UA 0-5 0 - 5 WBC/hpf    Comment:        Reflex urine culture not performed if WBC <=10, OR if Squamous epithelial cells >5. If Squamous epithelial cells >5 suggest recollection.    Bacteria, UA NONE SEEN NONE SEEN   Squamous Epithelial / HPF 0-5 0 - 5 /HPF    Comment: Performed at [facility], [address], [place], [place] [address]  TSH     Status: Abnormal   Collection Time: 02/11/23  3:34 PM  Result Value Ref Range   TSH 4.872 (H) 0.350 - 4.500 uIU/mL    Comment: Performed by a 3rd Generation assay with a functional sensitivity of <=0.01 uIU/mL. Performed at [facility], [address] [place], [place] [address]   Troponin I (High Sensitivity)     Status: None   Collection Time: 02/11/23  3:34 PM  Result Value Ref Range   Troponin I (High Sensitivity) 7 <18 ng/L    Comment: (NOTE) Elevated high sensitivity troponin I (hsTnI) values and significant  changes across serial measurements may suggest ACS but many other  chronic and acute conditions are known to elevate hsTnI results.  Refer to the "Links" section for chest pain algorithms and additional  guidance. Performed at [facility], [address] [place], [place] [address]   Comprehensive metabolic panel     Status: Abnormal   Collection Time: 02/12/23  5:34 AM  Result Value Ref Range   Sodium 137 135 - 145 mmol/L   Potassium 3.2 (L) 3.5 - 5.1 mmol/L   Chloride 103 98 - 111 mmol/L   CO2 22 22 - 32 mmol/L   Glucose, Bld 93 70 - 99 mg/dL    Comment: Glucose reference range applies only to samples taken after fasting for at least 8 hours.   BUN 17 8 - 23 mg/dL   Creatinine, Ser 2.95 (H) 0.44 - 1.00 mg/dL   Calcium 9.2 8.9 - 28.4 mg/dL   Total Protein 6.6 6.5 - 8.1 g/dL   Albumin 4.0 3.5 - 5.0 g/dL   AST 38 15 - 41 U/L   ALT 22 0 - 44 U/L    Alkaline Phosphatase 59 38 - 126 U/L   Total Bilirubin 1.1 0.3 - 1.2 mg/dL   GFR, Estimated 53 (L) >60 mL/min    Comment: (NOTE) Calculated using the CKD-EPI Creatinine Equation (2021)    Anion gap  12 5 - 15    Comment: Performed at [facility], [address] [place], [place] [address]  CBC     Status: Abnormal   Collection Time: 02/12/23  5:34 AM  Result Value Ref Range   WBC 6.5 4.0 - 10.5 K/uL   RBC 3.76 (L) 3.87 - 5.11 MIL/uL   Hemoglobin 11.7 (L) 12.0 - 15.0 g/dL   HCT 60.4 (L) 54.0 - 98.1 %   MCV 89.4 80.0 - 100.0 fL   MCH 31.1 26.0 - 34.0 pg   MCHC 34.8 30.0 - 36.0 g/dL   RDW 19.1 47.8 - 29.5 %   Platelets 179 150 - 400 K/uL   nRBC 0.0 0.0 - 0.2 %    Comment: Performed at [facility], [address], [place], [place] [address]   *Note: Due to a large number of results and/or encounters for the requested time period, some results have not been displayed. A complete set of results can be found in Results Review.   DG Chest Portable 1 View  Result Date: 02/11/2023 CLINICAL DATA:  Possible seizure versus syncopal episode. EXAM: PORTABLE CHEST 1 VIEW COMPARISON:  Chest radiograph 01/17/2023 FINDINGS: The heart is borderline enlarged. The upper mediastinal contours are prominent, likely at least in part due to the known ascending thoracic aortic aneurysm seen on prior CT. There is no focal consolidation or pulmonary edema. There is no pleural effusion or pneumothorax. Ill-defined opacity in the right suprahilar region is unchanged corresponding to the ground-glass nodular opacity seen on prior CT. There is no acute osseous abnormality. IMPRESSION: 1. No radiographic evidence of acute cardiopulmonary process. 2. Prominent mediastinal contours likely at least in part due to the known ascending thoracic aortic aneurysm. 3. Ill-defined right suprahilar opacity corresponding to the known ground-glass nodular lesion seen on prior CT. Electronically Signed   By: Lesia Hausen M.D.    On: 02/11/2023 14:56   CT HEAD WO CONTRAST  Result Date: 02/11/2023 CLINICAL DATA:  Seizure EXAM: CT HEAD WITHOUT CONTRAST TECHNIQUE: Contiguous axial images were obtained from the base of the skull through the vertex without intravenous contrast. RADIATION DOSE REDUCTION: This exam was performed according to the departmental dose-optimization program which includes automated exposure control, adjustment of the mA and/or kV according to patient size and/or use of iterative reconstruction technique. COMPARISON:  CT head 04/28/22, MR Head 08/01/22 FINDINGS: Brain: No evidence of acute infarction, hemorrhage, hydrocephalus, extra-axial collection or mass lesion/mass effect. Sequela of moderate chronic microvascular ischemic change. Redemonstrated chronic right parietal lobe infarct. Vascular: No hyperdense vessel or unexpected calcification. Skull: Normal. Negative for fracture or focal lesion. Sinuses/Orbits: No middle ear or mastoid effusion paranasal sinuses are clear. Bilateral lens replacement. Orbits are otherwise unremarkable. Other: None. IMPRESSION: 1. No acute intracranial abnormality. 2. Sequela of moderate chronic microvascular ischemic change and chronic right parietal lobe infarct. Electronically Signed   By: Lorenza Cambridge M.D.   On: 02/11/2023 13:24    Pending Labs Unresulted Labs (From admission, onward)     Start     Ordered   02/18/23 0500  Creatinine, serum  (enoxaparin (LOVENOX)    CrCl >/= 30 ml/min)  Weekly,   R     Comments: while on enoxaparin therapy    02/11/23 1621            Vitals/Pain Today's Vitals   02/12/23 0306 02/12/23 0600 02/12/23 0700 02/12/23 0800  BP:  (!) 155/69 (!) 157/59 (!) 143/104  Pulse:  [name] Kitchen)  55 (!) 55 (!) 58  Resp:  17 18 12   Temp: 98.6 F (37 C) 98.9 F (37.2 C)    TempSrc: Oral Axillary    SpO2:  100% 96% 100%  Weight:      Height:        Isolation Precautions No active isolations  Medications Medications  0.9 %  sodium chloride  infusion ( Intravenous New Bag/Given 02/11/23 1437)  LORazepam (ATIVAN) 2 MG/ML injection (has no administration in time range)  losartan (COZAAR) tablet 100 mg (has no administration in time range)  nebivolol (BYSTOLIC) tablet 10 mg (0 mg Oral Hold 02/11/23 2138)  FLUoxetine (PROZAC) capsule 40 mg (has no administration in time range)  levothyroxine (SYNTHROID) tablet 125 mcg (125 mcg Oral Given 02/12/23 0610)  albuterol (PROVENTIL) (2.5 MG/3ML) 0.083% nebulizer solution 3 mL (has no administration in time range)  enoxaparin (LOVENOX) injection 40 mg (40 mg Subcutaneous Given 02/12/23 0025)  sodium chloride flush (NS) 0.9 % injection 3 mL (3 mLs Intravenous Given 02/11/23 2152)  acetaminophen (TYLENOL) tablet 650 mg (has no administration in time range)    Or  acetaminophen (TYLENOL) suppository 650 mg (has no administration in time range)  polyethylene glycol (MIRALAX / GLYCOLAX) packet 17 g (has no administration in time range)  ALPRAZolam (XANAX) tablet 0.5 mg (has no administration in time range)  zolpidem (AMBIEN) tablet 5 mg (has no administration in time range)  triamcinolone cream (KENALOG) 0.1 % cream 1 Application (has no administration in time range)  latanoprost (XALATAN) 0.005 % ophthalmic solution 1 drop (1 drop Both Eyes Not Given 02/12/23 0704)  HYDROcodone-acetaminophen (NORCO/VICODIN) 5-325 MG per tablet 1 tablet (has no administration in time range)  levETIRAcetam (KEPPRA) IVPB 500 mg/100 mL premix (0 mg Intravenous Stopped 02/12/23 0854)  LORazepam (ATIVAN) injection 1 mg (has no administration in time range)  potassium chloride SA (KLOR-CON M) CR tablet 40 mEq (has no administration in time range)  levETIRAcetam (KEPPRA) IVPB 1000 mg/100 mL premix (0 mg Intravenous Stopped 02/11/23 1437)  LORazepam (ATIVAN) injection 1 mg (1 mg Intravenous Given 02/11/23 1821)  LORazepam (ATIVAN) injection 1 mg (1 mg Intravenous Given 02/11/23 2138)  LORazepam (ATIVAN) injection 1 mg (1 mg  Intravenous Given 02/12/23 0016)    Mobility walks with person assist     Focused Assessments Cardiac Assessment Handoff:  Cardiac Rhythm: Normal sinus rhythm Lab Results  Component Value Date   CKTOTAL 100 12/21/2018   TROPONINI <0.03 12/22/2018   Lab Results  Component Value Date   DDIMER 0.54 (H) 12/22/2018   Does the Patient currently have chest pain? No    R Recommendations: See Admitting Provider Note  Report given to:   Additional Notes:

## 2023-02-12 NOTE — ED Notes (Signed)
Discontinued restraint on left wrist, range of motion performed. Pt not attempting to remove lines at this time, falling back asleep, no acute distress. Vitals stable.

## 2023-02-12 NOTE — Procedures (Addendum)
Patient Name: Marissa [name]  MRN: [id number]  Epilepsy Attending: Charlsie Quest  Referring Physician/Provider: Sanjuana Letters, PA-C  Duration:  02/11/2023 2330 to 02/13/2023 0200  Patient history: 77 y.o. 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 EEG to evaluate for seizure.   Level of alertness: Awake, asleep  AEDs during EEG study: LEV  Technical aspects: This EEG study was done with scalp electrodes positioned according to the 10-20 International system of electrode placement. Electrical activity was reviewed with band pass filter of 1-70Hz , sensitivity of 7 uV/mm, display speed of 46mm/sec with a  notched filter applied as appropriate. EEG data were recorded continuously and digitally stored.  Video monitoring was available and reviewed as appropriate.  Description: The posterior dominant rhythm consists of 8-9 Hz activity of moderate voltage (25-35 uV) seen predominantly in posterior head regions, symmetric and reactive to eye opening and eye closing. Sleep was characterized by vertex waves, sleep spindles (12 to 14 Hz), maximal frontocentral region. EEG showed continuous 3 to 6 Hz theta-delta slowing in right posterior quadrant. Sharp waves were noted in right posterior quadrant. Hyperventilation and photic stimulation were not performed.     EEG was disconnected between 02/12/2023 0927 to 1039.   ABNORMALITY - Sharp wave, right posterior quadrant - Continuous slow, right posterior quadrant  IMPRESSION: This study showed evidence of epileptogenicity and cortical dysfunction arising from right posterior quadrant likely secondary to underlying encephalomalacia. No seizures were seen throughout the recording.  Marissa [name]

## 2023-02-12 NOTE — Progress Notes (Signed)
[facility] Progress Note Patient: Marissa [name] ZOX:[id number] DOB: 04-29-1946 DOA: 02/11/2023  DOS: the patient was seen and examined on 02/12/2023  Brief hospital course: Marissa [name] is a 77 y.o. female with medical history significant of anemia, anxiety, depression, dementia, asthma, hypertension, hypothyroidism, CVA, cardiac arrest during Tikosyn initiation, chronic fatigue, fibromyalgia, atrial fibrillation, prolonged QT syndrome, thoracic aortic aneurysm presenting following an unresponsive episode. Patient was with her husband at home and had an episode of unresponsiveness while sitting in a chair.  She first complained of right hand pain and then he states that her eyes rolled back and she became unresponsive.  Denies any shaking activity. Upon EMS arrival was alert but agitated. Following this patient had a witnessed seizure in the ED. Assessment and Plan: Acute seizure. Presents with unresponsive episodes. Had a witnessed seizure in the ED. Neurology consulted. Currently undergoing LTM EEG. MRI brain negative for any acute stroke. Will follow neurorecommendation. Currently on Keppra.  Prolonged QT syndrome. QTc has been chronically prolonged. Monitor. Avoid QT prolonging medication.  HTN. Blood pressure stable. Continue home regimen for now.  Hypokalemia. Currently being replaced.  History of CVA. Chronic A-fib. On Xarelto in the past. Inconsistent with the medication. Will monitor for now.  Hypothyroidism. Continue Synthroid.  History of dementia. Depression. Chronic fatigue syndrome and fibromyalgia. Anxiety. On Prozac, Lunesta and Xanax. Will monitor for now.  Subjective: Drowsy but easily arousable.  Denies any acute complaint.  No nausea no vomiting no fever no chills.  Physical Exam: General: in Mild distress, No Rash Cardiovascular: S1 and S2 Present, No Murmur Respiratory: Good respiratory effort, Bilateral Air entry present. No Crackles,  No wheezes Abdomen: Bowel Sound present, No tenderness Extremities: No edema Neuro: Drowsy, arousable, able to follow commands.  Oriented to self. No new focal deficit  Data Reviewed: I have Reviewed nursing notes, Vitals, and Lab results. Since last encounter, pertinent lab results CBC and BMP   . I have ordered test including CBC and BMP and magnesium  .   Disposition: Status is: Inpatient Remains inpatient appropriate because: Need for further workup for seizures  enoxaparin (LOVENOX) injection 40 mg Start: 02/11/23 2000   Family Communication: No one at bedside Level of care: Telemetry Medical   Vitals:   02/12/23 0700 02/12/23 0800 02/12/23 0955 02/12/23 1551  BP: (!) 157/59 (!) 143/104 (!) 170/98 (!) (P) 155/69  Pulse: (!) 55 (!) 58 (!) 54 (P) 62  Resp: (P) 16  Temp:   97.6 F (36.4 C) (P) 98.2 F (36.8 C)  TempSrc:    (P) Oral  SpO2: 96% 100% 100% (P) 100%  Weight:      Height:         Author: Lynden Oxford, MD 02/12/2023 6:39 PM  Please look on [email or web address] to find out who is on call.

## 2023-02-12 NOTE — Progress Notes (Signed)
Neurology Progress Note  Brief HPI: 77 year old patient with history of anemia, stroke, anxiety, depression, dementia, asthma, hypertension, hypothyroidism, cardiac arrest during Tikosyn initiation, chronic fatigue, fibromyalgia, A-fib, prolonged QT syndrome, lung mass and thoracic aortic aneurysm presents following an episode with unresponsiveness and shaking.  Yesterday, when she was at home she began complaining of right hand pain, then her eyes rolled back and she began foaming at the mouth and shaking diffusely.  Her husband called EMS and she was brought to the emergency department.  She then had a witnessed tonic-clonic seizure with respiratory changes.  Breathing was assisted with BVM, but then patient began to breathe spontaneously again.  Per husband, patient at baseline is oriented x 2 with some short-term memory loss and word finding difficulties.  Subjective: Patient reports that she is doing well and she slept well.  She has had no overt seizure activity overnight.  Exam: Vitals:   02/12/23 0800 02/12/23 0955  BP: (!) 143/104 (!) 170/98  Pulse: (!) 58 (!) 54  Resp: 12 16  Temp:  97.6 F (36.4 C)  SpO2: 100% 100%   Gen: In bed, NAD Resp: non-labored breathing, no acute distress  Neuro: Mental Status: Alert and oriented to person and place, able to follow one-step but not two-step commands Cranial Nerves: Pupils equal round and reactive to light, extraocular movements intact, facial sensation symmetrical, face symmetrical, phonation normal, hearing intact to voice, shoulder shrug symmetrical, tongue midline Motor: Able to move all 4 extremities with good antigravity strength Sensory: Intact to light touch throughout Gait: Deferred  Pertinent Labs:    Latest Ref Rng & Units 02/12/2023    5:34 AM 02/11/2023    2:22 PM 02/11/2023    1:00 PM  CBC  WBC 4.0 - 10.5 K/uL 6.5   5.6   Hemoglobin 12.0 - 15.0 g/dL 47.8  29.5  62.1   Hematocrit 36.0 - 46.0 % 33.6  42.0  34.6    Platelets 150 - 400 K/uL 179   219        Latest Ref Rng & Units 02/12/2023    5:34 AM 02/11/2023    2:22 PM 02/11/2023    1:00 PM  BMP  Glucose 70 - 99 mg/dL 93  308  657   BUN 8 - 23 mg/dL Creatinine 0.44 - 1.00 mg/dL 8.46  9.62  9.52   Sodium 135 - 145 mmol/L 137  137  134   Potassium 3.5 - 5.1 mmol/L 3.2  3.3  3.9   Chloride 98 - 111 mmol/L 103  104  98   CO2 22 - 32 mmol/L 22   17   Calcium 8.9 - 10.3 mg/dL 9.2   9.7     Urinalysis    Component Value Date/Time   COLORURINE YELLOW 02/11/2023 1534   APPEARANCEUR CLEAR 02/11/2023 1534   LABSPEC 1.012 02/11/2023 1534   PHURINE 7.0 02/11/2023 1534   GLUCOSEU NEGATIVE 02/11/2023 1534   HGBUR SMALL (A) 02/11/2023 1534   BILIRUBINUR NEGATIVE 02/11/2023 1534   BILIRUBINUR 1+ 12/17/2011 1704   KETONESUR NEGATIVE 02/11/2023 1534   PROTEINUR 100 (A) 02/11/2023 1534   UROBILINOGEN 0.2 12/17/2011 1704   NITRITE NEGATIVE 02/11/2023 1534   LEUKOCYTESUR NEGATIVE 02/11/2023 1534     Imaging Reviewed:  CT head: No acute abnormality, moderate chronic microvascular ischemic changes and chronic right parietal lobe infarct  MRI brain: Pending  EEG 4/17: Continuous slow right posterior quadrant, suggestive of cortical dysfunction arising  from right posterior quadrant secondary to underlying encephalomalacia, no seizures or epileptiform discharges seen  Assessment: 77 year old patient with history of anemia, stroke, anxiety, depression, dementia, asthma, hypertension, hypothyroidism, cardiac arrest during Tikosyn initiation, chronic fatigue, fibromyalgia, A-fib, prolonged QT syndrome, lung mass and thoracic aortic aneurysm presents following an episode at home in which she first complained of right arm pain, then had her eyes rolled in the back of her head, began foaming at the mouth and had rhythmic twitching movements.  She was brought to the emergency department where she had a witnessed GTC.   - Patient has been placed on  Keppra and has been on long-term EEG monitoring overnight.   - No definite seizures were seen on LTM EEG. 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   - Patient has had 1 seizure prior to this about 25 years ago.  Will be worthwhile to keep patient on Keppra indefinitely as seizures have recurred.  Impression: Seizure activity in a patient with stroke and dementia  Recommendations: 1) Continue LTM EEG for one more day 2) Continue Keppra maintenance 500 mg twice daily 3) MRI brain with and without contrast 4) Seizure precautions 5) Discussed [facility] statutes, patients with seizures are not allowed to drive until they have been seizure-free for six months (patient does not drive) Use caution when using heavy equipment or power tools. Avoid working on ladders or at heights. Take showers instead of baths. Ensure the water temperature is not too high on the home water heater. Do not go swimming alone. Do not lock yourself in a room alone (i.e. bathroom). When caring for infants or small children, sit down when holding, feeding, or changing them to minimize risk of injury to the child in the event you have a seizure. Maintain good sleep hygiene. Avoid alcohol.    Cortney E Ernestina Columbia , MSN, AGACNP-BC [facility] See Amion for schedule and pager information 02/12/2023 11:39 AM   Electronically signed: Dr. Caryl Pina

## 2023-02-13 DIAGNOSIS — R569 Unspecified convulsions: Secondary | ICD-10-CM | POA: Diagnosis not present

## 2023-02-13 LAB — CBC
HCT: 32.5 % — ABNORMAL LOW (ref 36.0–46.0)
Hemoglobin: 10.7 g/dL — ABNORMAL LOW (ref 12.0–15.0)
MCH: 30.3 pg (ref 26.0–34.0)
MCHC: 32.9 g/dL (ref 30.0–36.0)
MCV: 92.1 fL (ref 80.0–100.0)
Platelets: 141 10*3/uL — ABNORMAL LOW (ref 150–400)
RBC: 3.53 MIL/uL — ABNORMAL LOW (ref 3.87–5.11)
RDW: 14.4 % (ref 11.5–15.5)
WBC: 5.6 10*3/uL (ref 4.0–10.5)
nRBC: 0 % (ref 0.0–0.2)

## 2023-02-13 LAB — BASIC METABOLIC PANEL
Anion gap: 11 (ref 5–15)
BUN: 12 mg/dL (ref 8–23)
CO2: 20 mmol/L — ABNORMAL LOW (ref 22–32)
Calcium: 8.6 mg/dL — ABNORMAL LOW (ref 8.9–10.3)
Chloride: 108 mmol/L (ref 98–111)
Creatinine, Ser: 0.81 mg/dL (ref 0.44–1.00)
GFR, Estimated: >60 mL/min (ref >60)
Glucose, Bld: 79 mg/dL (ref 70–99)
Potassium: 3.6 mmol/L (ref 3.5–5.1)
Sodium: 139 mmol/L (ref 135–145)

## 2023-02-13 LAB — MAGNESIUM: Magnesium: 1.8 mg/dL (ref 1.7–2.4)

## 2023-02-13 MED ORDER — LEVETIRACETAM 500 MG PO TABS: 500.0000 mg | ORAL_TABLET | Freq: Two times a day (BID) | ORAL | 0 refills | Status: DC

## 2023-02-13 NOTE — Plan of Care (Signed)
  Problem: Safety: Goal: Verbalization of understanding the information provided will improve Outcome: Not Progressing   Problem: Safety: Goal: Non-violent Restraint(s) Outcome: Not Progressing   Problem: Nutrition: Goal: Adequate nutrition will be maintained Outcome: Not Progressing

## 2023-02-13 NOTE — TOC Transition Note (Signed)
Transition of Care [facility]) - CM/SW Discharge Note   Patient Details  Name: Coco Sharpnack MRN: [id number] Date of Birth: 1946-06-26  Transition of Care [facility]) CM/SW Contact:  Kermit Balo, RN Phone Number: 02/13/2023, 4:24 PM   Clinical Narrative:    Pt is discharging home with self care. No f/u per PT/OT.  Pt has transportation home.   Final next level of care: Home/Self Care Barriers to Discharge: No Barriers Identified   Patient Goals and CMS Choice      Discharge Placement                         Discharge Plan and Services Additional resources added to the After Visit Summary for                                       Social Determinants of Health (SDOH) Interventions SDOH Screenings   Food Insecurity: No Food Insecurity (02/11/2023)  Housing: Low Risk  (02/11/2023)  Transportation Needs: No Transportation Needs (02/11/2023)  Utilities: Not At Risk (02/11/2023)  Alcohol Screen: Low Risk  (08/12/2022)  Depression (PHQ2-9): Low Risk  (11/12/2022)  Financial Resource Strain: Low Risk  (02/22/2022)  Physical Activity: Inactive (02/22/2022)  Social Connections: Moderately Isolated (02/22/2022)  Stress: No Stress Concern Present (02/22/2022)  Tobacco Use: Low Risk  (02/12/2023)     Readmission Risk Interventions     No data to display

## 2023-02-13 NOTE — Progress Notes (Signed)
vLTM discontinued  No skin breakdown at all skin sites  [facility] notified 

## 2023-02-13 NOTE — Evaluation (Signed)
Physical Therapy Evaluation Patient Details Name: Marissa [name] MRN: [id number] DOB: 1946-04-07 Today's Date: 02/13/2023  History of Present Illness  77 y.o. female admitted for seizure. PMH includes HTN, aortic stenosis, HLD, CVA anemia, dementia, cardiac arrest, fibromyalgia.  Clinical Impression  Patient presents with mobility close to functional baseline.  She normally manages most ADL's on her own and spouse assists so she can do some IADL's with assist.  She had no issues with BP during position changes though educated on safety if feeling weak or light headed when rising.  She is eager for home and per discussion with pt/spouse no follow up needs at this time.  PT will sign off.    Orthostatic VS for the past 24 hrs (Last 3 readings):  BP- Lying Pulse- Lying BP- Sitting Pulse- Sitting BP- Standing at 0 minutes Pulse- Standing at 0 minutes BP- Standing at 3 minutes Pulse- Standing at 3 minutes  02/13/23 1500 151/76 65 149/71 68 141/68 70 179/81 73        Recommendations for follow up therapy are one component of a multi-disciplinary discharge planning process, led by the attending physician.  Recommendations may be updated based on patient status, additional functional criteria and insurance authorization.  Follow Up Recommendations       Assistance Recommended at Discharge Intermittent Supervision/Assistance  Patient can return home with the following  Assist for transportation;Help with stairs or ramp for entrance;Assistance with cooking/housework    Equipment Recommendations None recommended by PT  Recommendations for Other Services       Functional Status Assessment Patient has not had a recent decline in their functional status     Precautions / Restrictions Precautions Precautions: Fall Restrictions Weight Bearing Restrictions: No      Mobility  Bed Mobility Overal bed mobility: Modified Independent                  Transfers Overall transfer level:  Needs assistance Equipment used: None Transfers: Sit to/from Stand Sit to Stand: Supervision                Ambulation/Gait Ambulation/Gait assistance: Min guard, Supervision Gait Distance (Feet): 160 Feet Assistive device: None Gait Pattern/deviations: Step-through pattern, Decreased stride length, Drifts right/left       General Gait Details: mild drift in hallway, one LOB standing in gym when looking down at her heart monitor, reached for rail on steps to rebalance  Stairs Stairs: Yes Stairs assistance: Supervision Stair Management: One rail Right, Two rails, Alternating pattern, Forwards Number of Stairs: 2 General stair comments: supervision for safety  Wheelchair Mobility    Modified [place] (Stroke Patients Only)       Balance Overall balance assessment: Needs assistance Sitting-balance support: Feet supported Sitting balance-Leahy Scale: Good     Standing balance support: No upper extremity supported, During functional activity Standing balance-Leahy Scale: Good                               Pertinent Vitals/Pain Pain Assessment Faces Pain Scale: Hurts a little bit Pain Location: L hip Pain Descriptors / Indicators: Aching Pain Intervention(s): Monitored during session, Repositioned    Home Living Family/patient expects to be discharged to:: Private residence Living Arrangements: Spouse/significant other Available Help at Discharge: Family;Available 24 hours/day Type of Home: House Home Access: Stairs to enter Entrance Stairs-Rails: Left Entrance Stairs-Number of Steps: 3   Home Layout: Two level;Able to live on main level  with bedroom/bathroom Home Equipment: Rolling Walker (2 wheels);Cane - single point;Shower seat;Hand held Stage manager (4 wheels)      Prior Function Prior Level of Function : History of Falls (last six months);Needs assist             Mobility Comments: hx of falls. uses cane for mobility mostly  but will occasionally use Rollator ADLs Comments: able to manage ADLs w/o much cognitive assist per husband. Husband manages all IADLs including med mgmt     Hand Dominance   Dominant Hand: Right    Extremity/Trunk Assessment   Upper Extremity Assessment Upper Extremity Assessment: Generalized weakness    Lower Extremity Assessment Lower Extremity Assessment: Overall WFL for tasks assessed    Cervical / Trunk Assessment Cervical / Trunk Assessment: Kyphotic  Communication   Communication: No difficulties  Cognition Arousal/Alertness: Awake/alert Behavior During Therapy: WFL for tasks assessed/performed Overall Cognitive Status: History of cognitive impairments - at baseline                                          General Comments General comments (skin integrity, edema, etc.): Spoke with pt/spouse about current mobility level and they feel she is close to if not better than baseline and not feeling she needs any PT though she has had HHPT in the past.  Reviewed safety when rising, not to get up too quickly if feeling weak or light headed to lay back down.    Exercises     Assessment/Plan    PT Assessment Patient does not need any further PT services  PT Problem List         PT Treatment Interventions      PT Goals (Current goals can be found in the Care Plan section)  Acute Rehab PT Goals PT Goal Formulation: All assessment and education complete, DC therapy    Frequency       Co-evaluation               AM-PAC PT "6 Clicks" Mobility  Outcome Measure Help needed turning from your back to your side while in a flat bed without using bedrails?: None Help needed moving from lying on your back to sitting on the side of a flat bed without using bedrails?: None Help needed moving to and from a bed to a chair (including a wheelchair)?: A Little Help needed standing up from a chair using your arms (e.g., wheelchair or bedside chair)?:  None Help needed to walk in hospital room?: A Little Help needed climbing 3-5 steps with a railing? : None 6 Click Score: 22    End of Session Equipment Utilized During Treatment: Gait belt Activity Tolerance: Patient tolerated treatment well Patient left: in bed;with call bell/phone within reach;with family/visitor present   PT Visit Diagnosis: Muscle weakness (generalized) (M62.81)    Time: 0981-1914 PT Time Calculation (min) (ACUTE ONLY): 20 min   Charges:   PT Evaluation $PT Eval Low Complexity: 1 Low          Sheran Lawless, PT Acute Rehabilitation Services [phone] 02/13/2023   Elray Mcgregor 02/13/2023, 3:51 PM

## 2023-02-13 NOTE — Plan of Care (Signed)
Patient discharged home with husband. All education on seizures provided and patient husband aware of precautions and signs to look for. Ivs removed and patient escorted to main entrance  Problem: Education: Goal: Expressions of having a comfortable level of knowledge regarding the disease process will increase Outcome: Adequate for Discharge   Problem: Coping: Goal: Ability to adjust to condition or change in health will improve Outcome: Adequate for Discharge Goal: Ability to identify appropriate support needs will improve Outcome: Adequate for Discharge   Problem: Health Behavior/Discharge Planning: Goal: Compliance with prescribed medication regimen will improve Outcome: Adequate for Discharge   Problem: Medication: Goal: Risk for medication side effects will decrease Outcome: Adequate for Discharge   Problem: Clinical Measurements: Goal: Complications related to the disease process, condition or treatment will be avoided or minimized Outcome: Adequate for Discharge Goal: Diagnostic test results will improve Outcome: Adequate for Discharge   Problem: Safety: Goal: Verbalization of understanding the information provided will improve Outcome: Adequate for Discharge   Problem: Self-Concept: Goal: Level of anxiety will decrease Outcome: Adequate for Discharge Goal: Ability to verbalize feelings about condition will improve Outcome: Adequate for Discharge   Problem: Safety: Goal: Non-violent Restraint(s) Outcome: Adequate for Discharge   Problem: Education: Goal: Knowledge of General Education information will improve Description: Including pain rating scale, medication(s)/side effects and non-pharmacologic comfort measures Outcome: Adequate for Discharge   Problem: Health Behavior/Discharge Planning: Goal: Ability to manage health-related needs will improve Outcome: Adequate for Discharge   Problem: Clinical Measurements: Goal: Ability to maintain clinical  measurements within normal limits will improve Outcome: Adequate for Discharge Goal: Will remain free from infection Outcome: Adequate for Discharge Goal: Diagnostic test results will improve Outcome: Adequate for Discharge Goal: Respiratory complications will improve Outcome: Adequate for Discharge Goal: Cardiovascular complication will be avoided Outcome: Adequate for Discharge   Problem: Activity: Goal: Risk for activity intolerance will decrease Outcome: Adequate for Discharge   Problem: Nutrition: Goal: Adequate nutrition will be maintained Outcome: Adequate for Discharge   Problem: Coping: Goal: Level of anxiety will decrease Outcome: Adequate for Discharge   Problem: Elimination: Goal: Will not experience complications related to bowel motility Outcome: Adequate for Discharge Goal: Will not experience complications related to urinary retention Outcome: Adequate for Discharge   Problem: Pain Managment: Goal: General experience of comfort will improve Outcome: Adequate for Discharge   Problem: Safety: Goal: Ability to remain free from injury will improve Outcome: Adequate for Discharge   Problem: Skin Integrity: Goal: Risk for impaired skin integrity will decrease Outcome: Adequate for Discharge

## 2023-02-13 NOTE — Plan of Care (Signed)
LTM EEG report for this morning: Sharp wave, right posterior quadrant; Continuous slow, right posterior quadrant. The study shows evidence of epileptogenicity and cortical dysfunction arising from the right posterior quadrant likely secondary to underlying encephalomalacia. No seizures were seen throughout the recording.  A/R: 77 year old patient with history of anemia, stroke, anxiety, depression, dementia, asthma, hypertension, hypothyroidism, cardiac arrest during Tikosyn initiation, chronic fatigue, fibromyalgia, A-fib, prolonged QT syndrome, lung mass and thoracic aortic aneurysm presents following an episode at home in which she first complained of right arm pain, then had her eyes rolled in the back of her head, began foaming at the mouth and had rhythmic twitching movements.  She was brought to the emergency department where she had a witnessed GTC.   - Overall impression: Seizure activity in a patient with stroke and dementia  - Patient has been placed on Keppra. Will need to continue indefinitely at 500 mg twice daily  - Discontinuing LTM - Seizures likely caused by neurodegenerative/dementing process or prior stroke. Patient has had 1 seizure prior to this about 25 years ago.   - MRI brain: Unchanged punctate foci of hemosiderin deposition right frontal lobe and right parietal lobe compatible with chronic microhemorrhages. Mildly advanced cerebral atrophy for age. Redemonstrated sequela of prior infarcts in the left occipital lobe and right parietal lobe. Chronic small vessel ischemic disease. No acute findings.  - Neurhospitalist service will sign off. Please call if there are additional questions.  - Will need outpatient Neurology follow up.   Electronically signed: Dr. Caryl Pina

## 2023-02-13 NOTE — Evaluation (Signed)
Occupational Therapy Evaluation Patient Details Name: Marissa [name] MRN: [id number] DOB: [birth year] Today's Date: 02/13/2023   History of Present Illness 77 y.o. female admitted for seizure. PMH includes HTN, aortic stenosis, HLD, CVA anemia, dementia, cardiac arrest, fibromyalgia.   Clinical Impression   PTA, pt lives with spouse and typically Modified Independent with ADLs and mobility with intermittent cane use. Husband assists with IADLs due to pt cognitive deficits. Pt presents now fairly close to reported baseline with minor deficits in dynamic standing balance and dizziness with activity (did not worsen w/ activity). Overall, pt requires Supervision-Min guard for ADLs/mobility without AD. Pt does need more cognitive cues for ADL tasks than normal per husband though anticipate improvements once home in familiar environment. Spouse able to provide the needed assist w/o need for postacute OT follow up at this time. Functionally appropriate for DC home once medically cleared.      Recommendations for follow up therapy are one component of a multi-disciplinary discharge planning process, led by the attending physician.  Recommendations may be updated based on patient status, additional functional criteria and insurance authorization.   Assistance Recommended at Discharge Frequent or constant Supervision/Assistance  Patient can return home with the following A little help with bathing/dressing/bathroom;Assistance with cooking/housework;Direct supervision/assist for financial management;Direct supervision/assist for medications management;Assist for transportation    Functional Status Assessment  Patient has had a recent decline in their functional status and demonstrates the ability to make significant improvements in function in a reasonable and predictable amount of time.  Equipment Recommendations  None recommended by OT    Recommendations for Other Services       Precautions /  Restrictions Precautions Precautions: Fall Restrictions Weight Bearing Restrictions: No      Mobility Bed Mobility Overal bed mobility: Modified Independent                  Transfers Overall transfer level: Needs assistance Equipment used: None Transfers: Sit to/from Stand Sit to Stand: Supervision           General transfer comment: quickly stood at bedside without AD      Balance Overall balance assessment: Needs assistance, History of Falls Sitting-balance support: Feet supported, No upper extremity supported Sitting balance-Leahy Scale: Good     Standing balance support: No upper extremity supported, During functional activity Standing balance-Leahy Scale: Fair Standing balance comment: fair+                           ADL either performed or assessed with clinical judgement   ADL Overall ADL's : Needs assistance/impaired Eating/Feeding: Independent   Grooming: Supervision/safety;Standing;Oral care Grooming Details (indicate cue type and reason): cues for using appropriate tools for brushing teeth, sequencing due to perseveration on certain aspects of tasks Upper Body Bathing: Supervision/ safety   Lower Body Bathing: Supervison/ safety   Upper Body Dressing : Supervision/safety   Lower Body Dressing: Supervision/safety   Toilet Transfer: Min guard;Ambulation   Toileting- Clothing Manipulation and Hygiene: Supervision/safety       Functional mobility during ADLs: Min guard General ADL Comments: minor deficits in balance w/ minor LOB but able to correct. educated pt/spouse on fall prevention with ADLs, use of DME as needed, continued IADL assist including likely changing of meds at DC. able to mobilzie in room with RW first then progress away from AD     Vision Ability to See in Adequate Light: 0 Adequate Patient Visual Report: No change from  baseline Vision Assessment?: No apparent visual deficits     Perception     Praxis       Pertinent Vitals/Pain Pain Assessment Pain Assessment: Faces Faces Pain Scale: Hurts a little bit Pain Location: L hip Pain Descriptors / Indicators: Sore Pain Intervention(s): Monitored during session     Hand Dominance Right   Extremity/Trunk Assessment Upper Extremity Assessment Upper Extremity Assessment: Generalized weakness   Lower Extremity Assessment Lower Extremity Assessment: Defer to PT evaluation   Cervical / Trunk Assessment Cervical / Trunk Assessment: Normal   Communication Communication Communication: No difficulties   Cognition Arousal/Alertness: Awake/alert Behavior During Therapy: Restless, Impulsive Overall Cognitive Status: History of cognitive impairments - at baseline                                 General Comments: hx of dementia, very pleasant and eager to get home. impulsive and quick with movements at times, needs sequencing cues for basic tasks     General Comments       Exercises     Shoulder Instructions      Home Living Family/patient expects to be discharged to:: Private residence Living Arrangements: Spouse/significant other Available Help at Discharge: Family;Available 24 hours/day Type of Home: House Home Access: Stairs to enter [employer] of Steps: 3 Entrance Stairs-Rails: Left Home Layout: Two level;Able to live on main level with bedroom/bathroom     Bathroom Shower/Tub: Producer, television/film/video: Handicapped height Bathroom Accessibility: Yes   Home Equipment: Agricultural consultant (2 wheels);Cane - single point;Shower seat;Hand held Stage manager (4 wheels)          Prior Functioning/Environment Prior Level of Function : History of Falls (last six months);Needs assist             Mobility Comments: hx of falls. uses cane for mobility mostly but will occasionally use Rollator ADLs Comments: able to manage ADLs w/o much cognitive assist per husband. Husband manages all IADLs  including med mgmt        OT Problem List: Decreased strength;Decreased activity tolerance;Impaired balance (sitting and/or standing);Decreased cognition;Decreased knowledge of use of DME or AE      OT Treatment/Interventions: Therapeutic exercise;Self-care/ADL training;DME and/or AE instruction;Therapeutic activities;Patient/family education    OT Goals(Current goals can be found in the care plan section) Acute Rehab OT Goals Patient Stated Goal: home today OT Goal Formulation: With patient/family Time For Goal Achievement: 02/27/23 Potential to Achieve Goals: Good ADL Goals Pt Will Perform Grooming: with modified independence;standing Pt Will Transfer to Toilet: with modified independence;ambulating Additional ADL Goal #1: Pt to attend to  familiar ADL/functional tasks > 5 min with appropriate sequencing and min verbal cues  OT Frequency: Min 2X/week    Co-evaluation              AM-PAC OT "6 Clicks" Daily Activity     Outcome Measure Help from another person eating meals?: None Help from another person taking care of personal grooming?: A Little Help from another person toileting, which includes using toliet, bedpan, or urinal?: A Little Help from another person bathing (including washing, rinsing, drying)?: A Little Help from another person to put on and taking off regular upper body clothing?: A Little Help from another person to put on and taking off regular lower body clothing?: A Little 6 Click Score: 19   End of Session Equipment Utilized During Treatment: Gait belt Nurse Communication: Mobility status  Activity Tolerance: Patient tolerated treatment well Patient left: in bed;with call bell/phone within reach;with family/visitor present;Other (comment) (safety sitter at bedside)  OT Visit Diagnosis: Unsteadiness on feet (R26.81);Other abnormalities of gait and mobility (R26.89)                Time: 4782-9562 OT Time Calculation (min): 26 min Charges:  OT  General Charges $OT Visit: 1 Visit OT Evaluation $OT Eval Moderate Complexity: 1 Mod OT Treatments $Self Care/Home Management : 8-22 mins  Bradd Canary, OTR/L Acute Rehab Services Office: [phone]   Lorre Munroe 02/13/2023, 2:48 PM

## 2023-02-13 NOTE — Procedures (Addendum)
Patient Name: Marissa [name]  MRN: [id number]  Epilepsy Attending: Charlsie Quest  Referring Physician/Provider: Sanjuana Letters, PA-C  Duration: 02/13/2023 0200 to 02/13/2023 1139   Patient history: 77 y.o. 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 EEG to evaluate for seizure.    Level of alertness: Awake, asleep   AEDs during EEG study: LEV   Technical aspects: This EEG study was done with scalp electrodes positioned according to the 10-20 International system of electrode placement. Electrical activity was reviewed with band pass filter of 1-70Hz , sensitivity of 7 uV/mm, display speed of 70mm/sec with a  notched filter applied as appropriate. EEG data were recorded continuously and digitally stored.  Video monitoring was available and reviewed as appropriate.   Description: The posterior dominant rhythm consists of 8-9 Hz activity of moderate voltage (25-35 uV) seen predominantly in posterior head regions, symmetric and reactive to eye opening and eye closing. Sleep was characterized by vertex waves, sleep spindles (12 to 14 Hz), maximal frontocentral region. EEG showed continuous 3 to 6 Hz theta-delta slowing in right posterior quadrant. Sharp waves were noted in right posterior quadrant. Hyperventilation and photic stimulation were not performed.       ABNORMALITY - Sharp wave, right posterior quadrant - Continuous slow, right posterior quadrant   IMPRESSION: This study showed evidence of epileptogenicity and cortical dysfunction arising from right posterior quadrant likely secondary to underlying encephalomalacia. No seizures were seen throughout the recording.   Marissa [name]

## 2023-02-14 ENCOUNTER — Telehealth: Payer: Self-pay | Admitting: *Deleted

## 2023-02-14 ENCOUNTER — Encounter: Payer: Self-pay | Admitting: *Deleted

## 2023-02-14 NOTE — Transitions of Care (Post Inpatient/ED Visit) (Signed)
02/14/2023  Name: Marissa [name] MRN: [id number] DOB: Apr 08, 1946  Today's TOC FU Call Status: Today's TOC FU Call Status:: Successful TOC FU Call Competed TOC FU Call Complete Date: 02/14/23  Transition Care Management Follow-up Telephone Call Date of Discharge: 02/13/23 Discharge Facility: [facility] Type of Discharge: Inpatient Admission Primary Inpatient Discharge Diagnosis:: new onset seizure and unresponsive episode in setting of dementia How have you been since you were released from the hospital?: Better (per husband/ caregiver Ed: "Overall, things are okay.  She had an episode of her normal hip pain last night, but other than that, no issues so far.  I manage all of her care needs and medicines.") Any questions or concerns?: No  Items Reviewed: Did you receive and understand the discharge instructions provided?: Yes (thoroughly reviewed with patient who verbalizes good understanding of same) Medications obtained and verified?: Yes (Medications Reviewed) (Full medication reconciliation/ review completed; no concerns or discrepancies identified; confirmed patient obtained/ is taking all newly Rx'd medications as instructed; self-manages medications and denies questions/ concerns around medications today) Any new allergies since your discharge?: No Dietary orders reviewed?: Yes Type of Diet Ordered:: Heart Healthy Do you have support at home?: Yes People in Home: spouse Name of Support/Comfort Primary Source: husband Ed is primary caregiver and assists with all care needs- reports he and patient's daughter assists patient with 'whatever she needs;" states he is in process of trying to line up private duty care care assistance: today, he declines need for ongoing care coordination outreaches  Home Care and Equipment/Supplies: Were Home Health Services Ordered?: No Any new equipment or medical supplies ordered?: No  Functional Questionnaire: Do you need assistance with  bathing/showering or dressing?: Yes (husband is primary caregiver and assists with all care needs) Do you need assistance with meal preparation?: Yes (husband is primary caregiver and assists with all care needs) Do you need assistance with eating?: No Do you have difficulty maintaining continence: Yes (husband is primary caregiver and assists with all care needs) Do you need assistance with getting out of bed/getting out of a chair/moving?: Yes (husband is primary caregiver and assists with all care needs) Do you have difficulty managing or taking your medications?: Yes (husband is primary caregiver and manages all aspects of medication administration)  Follow up appointments reviewed: PCP Follow-up appointment confirmed?: Yes (care coordination outreach in real-time with scheduling care guide to successfully schedule hospital follow up PCP appointment 02/21/23) Date of PCP follow-up appointment?: 02/21/23 Follow-up Provider: PCP, [facility] Follow-up appointment confirmed?: No Reason Specialist Follow-Up Not Confirmed: Patient has Specialist Provider Number and will Call for Appointment Do you need transportation to your follow-up appointment?: No Do you understand care options if your condition(s) worsen?: Yes-patient verbalized understanding  SDOH Interventions Today    Flowsheet Row Most Recent Value  SDOH Interventions   Food Insecurity Interventions Intervention Not Indicated  Transportation Interventions Intervention Not Indicated  [spouse/ daughter provide transportation]      TOC Interventions Today    Flowsheet Row Most Recent Value  TOC Interventions   TOC Interventions Discussed/Reviewed TOC Interventions Discussed, Arranged PCP follow up within 7 days/Care Guide scheduled  [Caregiver declines need for ongoing/ further care coordination outreach,  potential care coordination needs identified at time of TOC call today- caregiver declines,  provided my direct  contact information for questions/ concerns/ needs arise post-TOC call]      Interventions Today    Flowsheet Row Most Recent Value  Chronic Disease  Chronic disease during today's visit Other  [dementia with new onset seizures]  General Interventions   General Interventions Discussed/Reviewed General Interventions Discussed, Doctor Visits  Doctor Visits Discussed/Reviewed Doctor Visits Discussed, Specialist, PCP  PCP/Specialist Visits Compliance with follow-up visit  Education Interventions   Education Provided Provided Education  Provided Verbal Education On Other  [caregiver resources- [facility] CM services]  Nutrition Interventions   Nutrition Discussed/Reviewed Nutrition Discussed  Pharmacy Interventions   Pharmacy Dicussed/Reviewed Pharmacy Topics Discussed  [Full medication review with updating medication list in EHR per patient report]  Safety Interventions   Safety Discussed/Reviewed Safety Discussed  [confirmed with caregiver that patient uses assistive devices on regular basis, at baseline]      Caryl Pina, RN, BSN, CCRN Alumnus RN CM Care Coordination/ Transition of Care- [facility] Care Management [phone]: direct office

## 2023-02-16 NOTE — Discharge Summary (Signed)
Physician Discharge Summary   Patient: Marissa [name] MRN: [id number] DOB: 1946-03-09  Admit date:     02/11/2023  Discharge date: 02/13/2023  Discharge Physician: Lynden Oxford  PCP: Myrlene Broker, MD  Recommendations at discharge: Follow-up with PCP in 1 week. Follow-up with neurology as recommended.   Follow-up Information     Myrlene Broker, MD. Schedule an appointment as soon as possible for a visit in 1 week(s).   Specialty: Internal Medicine Contact information: [address] [place] [address] [phone]         Marcos Eke, PA-C. Schedule an appointment as soon as possible for a visit in 1 month(s).   Specialty: Neurology Contact information: [address] [place] [address] [place] [address] [phone]               Discharge Diagnoses: Principal Problem:   Seizures Active Problems:   Prolonged QT interval   Dementia   Persistent atrial fibrillation (HCC): CHA2DS2-VASc Score = 7    Hypothyroidism   ANEMIA, IRON DEFICIENCY   Asthma   Fibromyalgia   Anxiety   HTN (hypertension)   Depressive disorder   Acute encephalopathy  Hospital Course: Marissa [name] is a 77 y.o. female with medical history significant of anemia, anxiety, depression, dementia, asthma, hypertension, hypothyroidism, CVA, cardiac arrest during Tikosyn initiation, chronic fatigue, fibromyalgia, atrial fibrillation, prolonged QT syndrome, thoracic aortic aneurysm presenting following an unresponsive episode. Patient was with her husband at home and had an episode of unresponsiveness while sitting in a chair.  She first complained of right hand pain and then he states that her eyes rolled back and she became unresponsive.  Denies any shaking activity. Upon EMS arrival was alert but agitated. Following this patient had a witnessed seizure in the ED. Assessment and Plan  Acute seizure. Presents with unresponsive episodes. Had a witnessed seizure in the  ED. Neurology consulted. Underwent LTM EEG. Started on Keppra.  Neurology recommended indefinite 400 mg twice daily Keppra. MRI brain negative for any acute stroke.   Prolonged QT syndrome. QTc has been chronically prolonged. Monitor. Avoid QT prolonging medication.   HTN. Blood pressure stable. Continue home regimen for now.   Hypokalemia. Currently being replaced.   History of CVA. Chronic A-fib. On Xarelto in the past. Inconsistent with the medication. Will monitor for now.   Hypothyroidism. Continue Synthroid.   History of dementia. Depression. Chronic fatigue syndrome and fibromyalgia. Anxiety. On Prozac, Lunesta and Xanax. Will monitor for now.   Consultants:  Neurology  Procedures performed:  LTM EEG  DISCHARGE MEDICATION: Allergies as of 02/13/2023       Reactions   Crestor [rosuvastatin] Anaphylaxis, Hives, Shortness Of Breath, Itching, Photosensitivity, Nausea And Vomiting, Anxiety, Palpitations, Other (See Comments)   Duragesic-100 [fentanyl] Shortness Of Breath, Rash   Pacerone [amiodarone] Nausea Only, Other (See Comments)   Ataxia  Weakness  Chills Dyspnea  Near syncope Chest pain   Tikosyn [dofetilide] Other (See Comments)   Torsade and cardiac arrest during dofetilide initiation   Apresoline [hydralazine] Other (See Comments)   Weakness  Sweats Skin redness   Hctz [hydrochlorothiazide] Other (See Comments)   Hyponatremia   Oxycontin [oxycodone] Other (See Comments)   Unknown reaction   Amoxil [amoxicillin] Diarrhea, Swelling, Rash   Rash to vaginal area, including swelling   Codeine Other (See Comments)   Hyperactivity    Conjugated Estrogens Itching, Rash   Erythromycin Rash   Reaction to E-mycin Has done ok with  other medications in this class   Feldene [piroxicam] Itching, Rash   Reaction to brand name Feldene        Medication List     STOP taking these medications    metoprolol tartrate 25 MG tablet Commonly known  as: LOPRESSOR       TAKE these medications    ALPRAZolam 0.5 MG tablet Commonly known as: XANAX Take 1 tablet (0.5 mg total) by mouth 2 (two) times daily as needed for anxiety.   cloNIDine 0.2 MG tablet Commonly known as: CATAPRES Take 0.1 mg by mouth 2 (two) times daily as needed (systolic over 160). if blood pressure is great than 160 systolic for more than an hour What changed:  how much to take how to take this when to take this reasons to take this additional instructions   Eszopiclone 3 MG Tabs Take 1 tablet (3 mg total) by mouth at bedtime.   FLUoxetine 20 MG tablet Commonly known as: PROZAC Take 2 tablets (40 mg total) by mouth daily.   Flutter Devi Use as directed   HYDROcodone-acetaminophen 5-325 MG tablet Commonly known as: NORCO/VICODIN Take 1 tablet by mouth daily as needed for moderate pain.   latanoprost 0.005 % ophthalmic solution Commonly known as: XALATAN Place 1 drop into both eyes at bedtime.   levETIRAcetam 500 MG tablet Commonly known as: Keppra Take 1 tablet (500 mg total) by mouth 2 (two) times daily.   levothyroxine 125 MCG tablet Commonly known as: SYNTHROID TAKE 1 TABLET(125 MCG) BY MOUTH DAILY What changed: See the new instructions.   losartan 100 MG tablet Commonly known as: COZAAR Take 1 tablet (100 mg total) by mouth daily. What changed: when to take this   magnesium oxide 400 MG tablet Commonly known as: MAG-OX Take 1 tablet (400 mg total) by mouth 2 (two) times daily. What changed: when to take this   multivitamin with minerals Tabs tablet Take 1 tablet by mouth daily.   naproxen sodium 220 MG tablet Commonly known as: ALEVE Take 440 mg by mouth daily as needed (pain).   nebivolol 10 MG tablet Commonly known as: BYSTOLIC TAKE 1 TABLET(10 MG) BY MOUTH DAILY AT 8 PM What changed: See the new instructions.   triamcinolone cream 0.1 % Commonly known as: KENALOG Apply 1 Application topically 2 (two) times daily. What  changed:  when to take this reasons to take this       Disposition: Home Diet recommendation: Cardiac diet  Discharge Exam: Vitals:   02/12/23 2349 02/13/23 0340 02/13/23 0829 02/13/23 1212  BP: (!) 163/67 (!) 167/92 (!) 169/79 (!) 140/65  Pulse: (!) 59 (!) 59 61 66  Resp: 18 14 18 19   Temp: 97.6 F (36.4 C) 97.9 F (36.6 C) 97.9 F (36.6 C) 98.2 F (36.8 C)  TempSrc: Oral Oral Oral Oral  SpO2: 98% 97% 100% 99%  Weight:      Height:       General: Appear in no distress; no visible Abnormal Neck Mass Or lumps, Conjunctiva normal Cardiovascular: S1 and S2 Present, no Murmur, Respiratory: good respiratory effort, Bilateral Air entry present and CTA, no Crackles, no wheezes Abdomen: Bowel Sound present, Non tender  Extremities: no Pedal edema Neurology: alert and oriented to place and person  Filed Weights   02/11/23 1307  Weight: 59 kg   Condition at discharge: stable  The results of significant diagnostics from this hospitalization (including imaging, microbiology, ancillary and laboratory) are listed below for reference.   Imaging  Studies: MR BRAIN WO CONTRAST  Result Date: 02/12/2023 CLINICAL DATA:  Seizure, stroke suspected EXAM: MRI HEAD WITHOUT CONTRAST TECHNIQUE: Multiplanar, multiecho pulse sequences of the brain and surrounding structures were obtained without intravenous contrast. COMPARISON:  08/01/2022 MRI head, correlation is also made with 02/11/2023 CT head FINDINGS: Evaluation is somewhat limited by motion artifact. Brain: No restricted diffusion to suggest acute or subacute infarct. No acute hemorrhage, mass, mass effect, or midline shift. No hydrocephalus or extra-axial collection. Normal craniocervical junction. Unchanged punctate foci of hemosiderin deposition right frontal lobe (series 7, image 85) and right parietal lobe (series 7, image [id number]), compatible with chronic microhemorrhages. Mildly advanced cerebral atrophy for age. Redemonstrated sequela  prior infarcts in the left occipital lobe and right parietal lobe. T2 hyperintense signal in the periventricular white matter, likely the sequela of chronic small vessel ischemic disease. Vascular: Normal arterial flow voids. Skull and upper cervical spine: Normal marrow signal. Sinuses/Orbits: Clear paranasal sinuses. Status post bilateral lens replacements. Other: The mastoid air cells are well aerated. IMPRESSION: No acute intracranial process. No evidence of acute or subacute infarct. Electronically Signed   By: Wiliam Ke M.D.   On: 02/12/2023 13:52   Overnight EEG with video  Result Date: 02/12/2023 Charlsie Quest, MD     02/13/2023  9:40 AM Patient Name: Marissa [name] MRN: [id number] Epilepsy Attending: Charlsie Quest Referring Physician/Provider: Sanjuana Letters, PA-C Duration:  02/11/2023 2330 to 02/13/2023 0200 Patient history: 77 y.o. 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 EEG to evaluate for seizure. Level of alertness: Awake, asleep AEDs during EEG study: LEV Technical aspects: This EEG study was done with scalp electrodes positioned according to the 10-20 International system of electrode placement. Electrical activity was reviewed with band pass filter of 1-70Hz , sensitivity of 7 uV/mm, display speed of 15mm/sec with a  notched filter applied as appropriate. EEG data were recorded continuously and digitally stored.  Video monitoring was available and reviewed as appropriate. Description: The posterior dominant rhythm consists of 8-9 Hz activity of moderate voltage (25-35 uV) seen predominantly in posterior head regions, symmetric and reactive to eye opening and eye closing. Sleep was characterized by vertex waves, sleep spindles (12 to 14 Hz), maximal frontocentral region. EEG showed  continuous 3 to 6 Hz theta-delta slowing in right posterior quadrant. Sharp waves were noted in right posterior quadrant. Hyperventilation and photic stimulation were not performed.   EEG was disconnected between 02/12/2023 0927 to 1039. ABNORMALITY - Sharp wave, right posterior quadrant - Continuous slow, right posterior quadrant IMPRESSION: This study showed evidence of epileptogenicity and cortical dysfunction arising from right posterior quadrant likely secondary to underlying encephalomalacia. No seizures were seen throughout the recording. Charlsie Quest   DG Chest Portable 1 View  Result Date: 02/11/2023 CLINICAL DATA:  Possible seizure versus syncopal episode. EXAM: PORTABLE CHEST 1 VIEW COMPARISON:  Chest radiograph 01/17/2023 FINDINGS: The heart is borderline enlarged. The upper mediastinal contours are prominent, likely at least in part due to the known ascending thoracic aortic aneurysm seen on prior CT. There is no focal consolidation or pulmonary edema. There is no pleural effusion or pneumothorax. Ill-defined opacity in the right suprahilar region is unchanged corresponding to the ground-glass nodular opacity seen on prior CT. There is no acute osseous abnormality. IMPRESSION: 1. No radiographic evidence of acute cardiopulmonary process. 2. Prominent  mediastinal contours likely at least in part due to the known ascending thoracic aortic aneurysm. 3. Ill-defined right suprahilar opacity corresponding to the known ground-glass nodular lesion seen on prior CT. Electronically Signed   By: Lesia Hausen M.D.   On: 02/11/2023 14:56   CT HEAD WO CONTRAST  Result Date: 02/11/2023 CLINICAL DATA:  Seizure EXAM: CT HEAD WITHOUT CONTRAST TECHNIQUE: Contiguous axial images were obtained from the base of the skull through the vertex without intravenous contrast. RADIATION DOSE REDUCTION: This exam was performed according to the departmental dose-optimization program which includes automated exposure  control, adjustment of the mA and/or kV according to patient size and/or use of iterative reconstruction technique. COMPARISON:  CT head 04/28/22, MR Head 08/01/22 FINDINGS: Brain: No evidence of acute infarction, hemorrhage, hydrocephalus, extra-axial collection or mass lesion/mass effect. Sequela of moderate chronic microvascular ischemic change. Redemonstrated chronic right parietal lobe infarct. Vascular: No hyperdense vessel or unexpected calcification. Skull: Normal. Negative for fracture or focal lesion. Sinuses/Orbits: No middle ear or mastoid effusion paranasal sinuses are clear. Bilateral lens replacement. Orbits are otherwise unremarkable. Other: None. IMPRESSION: 1. No acute intracranial abnormality. 2. Sequela of moderate chronic microvascular ischemic change and chronic right parietal lobe infarct. Electronically Signed   By: Lorenza Cambridge M.D.   On: 02/11/2023 13:24    Microbiology: Results for orders placed or performed during the hospital encounter of 01/17/23  SARS Coronavirus 2 by RT PCR (hospital order, performed in [facility] hospital lab) *cepheid single result test* Anterior Nasal Swab     Status: None   Collection Time: 01/17/23  9:10 AM   Specimen: Anterior Nasal Swab  Result Value Ref Range Status   SARS Coronavirus 2 by RT PCR NEGATIVE NEGATIVE Final    Comment: Performed at [facility], [address] [place], [place] [address]  Fungus Culture With Stain     Status: None (Preliminary result)   Collection Time: 01/17/23 12:20 PM   Specimen: PATH Other; Tissue  Result Value Ref Range Status   Fungus Stain Final report  Final    Comment: (NOTE) Performed At: [facility] [address] [place], [place] [id number] Jolene Schimke MD XB:[id number]    Fungus (Mycology) Culture PENDING  Incomplete   Fungal Source BIOPSY  Final    Comment: RIGHT UPPER LUNG Performed at [facility], [address], [place], [place] [address]   Aerobic/Anaerobic  Culture w Gram Stain (surgical/deep wound)     Status: None   Collection Time: 01/17/23 12:20 PM   Specimen: PATH Other; Tissue  Result Value Ref Range Status   Specimen Description BIOPSY RIGHT UPPER LUNG  Final   Special Requests NONE  Final   Gram Stain NO WBC SEEN NO ORGANISMS SEEN   Final   Culture   Final    No growth aerobically or anaerobically. Performed at [facility], [address], [place], [place] [address]    Report Status 01/23/2023 FINAL  Final  Acid Fast Smear (AFB)     Status: None   Collection Time: 01/17/23 12:20 PM   Specimen: PATH Other; Tissue  Result Value Ref Range Status   AFB Specimen Processing Comment  Final    Comment: Tissue Grinding and Digestion/Decontamination   Acid Fast Smear Negative  Final    Comment: (NOTE) Performed At: [facility] [address] [place], [place] [id number] Jolene Schimke MD KG:[id number]    Source (AFB) BIOPSY  Final    Comment: RIGHT UPPER LUNG  Performed at [facility], [address] [place], [place] [address]   Fungus Culture Result     Status: None   Collection Time: 01/17/23 12:20 PM  Result Value Ref Range Status   Result 1 Comment  Final    Comment: (NOTE) KOH/Calcofluor preparation:  no fungus observed. Performed At: [facility] [address] [place], [place] [id number] Jolene Schimke MD QI:[id number]    *Note: Due to a large number of results and/or encounters for the requested time period, some results have not been displayed. A complete set of results can be found in Results Review.   Labs: CBC: Recent Labs  Lab 02/11/23 1300 02/11/23 1422 02/12/23 0534 02/13/23 0327  WBC 5.6  --  6.5 5.6  NEUTROABS 3.9  --   --   --   HGB 12.1 14.3 11.7* 10.7*  HCT 34.6* 42.0 33.6* 32.5*  MCV 87.8  --  89.4 92.1  PLT 219  --  179 141*   Basic Metabolic Panel: Recent Labs  Lab 02/11/23 1300 02/11/23 1422 02/12/23 0534 02/13/23 0327  NA 134* 137 137 139  K 3.9 3.3*  3.2* 3.6  CL 98 104 103 108  CO2 17*  --  22 20*  GLUCOSE 108* 159* 93 79  BUN 16 17 17 12   CREATININE 1.09* 1.00 1.08* 0.81  CALCIUM 9.7  --  9.2 8.6*  MG 2.0  --   --  1.8   Liver Function Tests: Recent Labs  Lab 02/11/23 1300 02/12/23 0534  AST 34 38  ALT 20 22  ALKPHOS 55 59  BILITOT 1.1 1.1  PROT 6.8 6.6  ALBUMIN 4.2 4.0   CBG: Recent Labs  Lab 02/11/23 1416  GLUCAP 159*    Discharge time spent: greater than 30 minutes.  Signed: Lynden Oxford, MD [facility] 02/13/2023

## 2023-02-19 LAB — FUNGAL ORGANISM REFLEX

## 2023-02-19 LAB — FUNGUS CULTURE RESULT

## 2023-02-19 LAB — FUNGUS CULTURE WITH STAIN

## 2023-02-21 ENCOUNTER — Inpatient Hospital Stay: Payer: Medicare Other | Admitting: Internal Medicine

## 2023-02-21 ENCOUNTER — Encounter: Payer: Self-pay | Admitting: Internal Medicine

## 2023-02-25 MED ORDER — LEVETIRACETAM 500 MG PO TABS: 500.0000 mg | ORAL_TABLET | Freq: Two times a day (BID) | ORAL | 0 refills | Status: DC

## 2023-02-28 ENCOUNTER — Encounter: Payer: Self-pay | Admitting: Internal Medicine

## 2023-02-28 ENCOUNTER — Telehealth (INDEPENDENT_AMBULATORY_CARE_PROVIDER_SITE_OTHER): Payer: Medicare Other | Admitting: Internal Medicine

## 2023-02-28 DIAGNOSIS — G309 Alzheimer's disease, unspecified: Secondary | ICD-10-CM

## 2023-02-28 DIAGNOSIS — R569 Unspecified convulsions: Secondary | ICD-10-CM

## 2023-02-28 DIAGNOSIS — F02B3 Dementia in other diseases classified elsewhere, moderate, with mood disturbance: Secondary | ICD-10-CM | POA: Diagnosis not present

## 2023-02-28 MED ORDER — HYDROCODONE-ACETAMINOPHEN 5-325 MG PO TABS: 1.0000 | ORAL_TABLET | Freq: Every day | ORAL | 0 refills | Status: DC | PRN

## 2023-02-28 MED ORDER — MONTELUKAST SODIUM 10 MG PO TABS: 10.0000 mg | ORAL_TABLET | Freq: Every day | ORAL | 3 refills | Status: DC

## 2023-02-28 NOTE — Assessment & Plan Note (Signed)
No seizures since leaving hospital and encouraged to continue keppra 500 mg BID.

## 2023-02-28 NOTE — Progress Notes (Signed)
Virtual Visit via Video Note  I connected with Marissa [name] Markgraf on 02/28/23 at  2:20 PM EDT by a video enabled telemedicine application and verified that I am speaking with the correct person using two identifiers.  The patient and the provider were at separate locations throughout the entire encounter. Patient location: home, Provider location: work   I discussed the limitations of evaluation and management by telemedicine and the availability of in person appointments. The patient expressed understanding and agreed to proceed. The patient and the provider were the only parties present for the visit unless noted in HPI below.  History of Present Illness: The patient is a 77 y.o. female with visit for complicated medical history. Had a fainting episode since leaving hospital but no seizure activity. Taking keppra since leaving hospital. Denies drowsiness some dizziness more chronic. Vision is poor. Memory is poor and problems with using things around the home per husband.  Observations/Objective: Appearance: chronically ill, breathing appears normal, casual grooming, abdomen does not appear distended, awake and alert to person and place  Assessment and Plan: See problem oriented charting  Follow Up Instructions: continue keppra until neurology visit, refill hydrocodone which she uses for pain as needed rare, refill montelukast for sinuses  Visit time 25 minutes in face to face communication with patient and coordination of care, additional 5 minutes spent in record review, coordination or care, ordering tests, communicating/referring to other healthcare professionals, documenting in medical records all on the same day of the visit for total time 30 minutes spent on the visit.    I discussed the assessment and treatment plan with the patient. The patient was provided an opportunity to ask questions and all were answered. The patient agreed with the plan and demonstrated an understanding of the  instructions.   The patient was advised to call back or seek an in-person evaluation if the symptoms worsen or if the condition fails to improve as anticipated.  Myrlene Broker, MD

## 2023-02-28 NOTE — Assessment & Plan Note (Signed)
Overall stable and chronic changes on the MRI brain consistent with prior.

## 2023-03-02 LAB — ACID FAST CULTURE WITH REFLEXED SENSITIVITIES (MYCOBACTERIA): Acid Fast Culture: NEGATIVE

## 2023-03-03 ENCOUNTER — Encounter: Payer: Self-pay | Admitting: Cardiology

## 2023-03-03 ENCOUNTER — Ambulatory Visit: Payer: Medicare Other | Attending: Cardiology | Admitting: Cardiology

## 2023-03-03 VITALS — BP 138/76 | HR 62 | Ht 64.0 in | Wt 130.2 lb

## 2023-03-03 DIAGNOSIS — I350 Nonrheumatic aortic (valve) stenosis: Secondary | ICD-10-CM

## 2023-03-03 DIAGNOSIS — R0989 Other specified symptoms and signs involving the circulatory and respiratory systems: Secondary | ICD-10-CM

## 2023-03-03 DIAGNOSIS — T50905A Adverse effect of unspecified drugs, medicaments and biological substances, initial encounter: Secondary | ICD-10-CM

## 2023-03-03 DIAGNOSIS — D6869 Other thrombophilia: Secondary | ICD-10-CM

## 2023-03-03 DIAGNOSIS — I77810 Thoracic aortic ectasia: Secondary | ICD-10-CM | POA: Diagnosis not present

## 2023-03-03 DIAGNOSIS — I4819 Other persistent atrial fibrillation: Secondary | ICD-10-CM

## 2023-03-03 DIAGNOSIS — R0609 Other forms of dyspnea: Secondary | ICD-10-CM

## 2023-03-03 DIAGNOSIS — I4721 Torsades de pointes: Secondary | ICD-10-CM

## 2023-03-03 DIAGNOSIS — E785 Hyperlipidemia, unspecified: Secondary | ICD-10-CM

## 2023-03-03 NOTE — Patient Instructions (Signed)

## 2023-03-03 NOTE — Progress Notes (Signed)
Primary Care Provider: Myrlene Broker, MD [facility] Cardiologist: Marissa Lemma, MD Electrophysiologist: Marissa Prude, MD  Clinic Note: Chief Complaint  Patient presents with   2 Week Follow-up    Overall doing better.   Atrial Fibrillation    Not aware of being in or not.  No longer taking Xarelto.   Hypertension    Still labile, but out of concern.  On stable regimen now.   Aortic Stenosis    Stable on last check    ===================================  ASSESSMENT/PLAN   Problem List Items Addressed This Visit       Cardiology Problems   Persistent atrial fibrillation [facility]): CHA2DS2-VASc Score = 7  - Primary (Chronic)    He seems to be in sinus rhythm today.  Not aware of being in or out of A-fib. Despite the fact that she had a stroke with a CHA2DS2-VASc score 7, she pretty much refuses to take Xarelto.  At this point, I tried to discuss potentially going on aspirin but is due for to take medications.  For now we will simply continue Bystolic for rate control.  No more AAD options. Maybe she will go back to taking aspirin, but will defer to their decision.      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  Is following up with Dr. Dorris [name].      Mild aortic stenosis (Chronic)    Based on results of echo back in July 2023, I think we can probably wait a couple years before used to be reassessed.      Labile hypertension (Chronic)    BP seems to be somewhat stable now.  I think we have a stable regimen with a total of 100 mg losartan +10 mg of Bystolic from baseline.  Reluctant to use diuretic because of her dizziness and tendency for orthostasis.  As needed clonidine for sustained pressures greater than 160 mmHg.  This means 2 consecutive readings 1 hour apart.      Drug-induced torsades de pointes (HCC) (Chronic)    No longer on any antiarrhythmic agents.  Only on beta-blocker.  I think a lot of this has to do with  her being acutely ill and multiple different attempted medications;  Somewhat concerning with her being on Keppra.  Need to monitor for signs symptoms of potential syncope        Other   Secondary hypercoagulable state (HCC) (Chronic)    Against my recommendations, she has decided that she does not want to take Xarelto or Eliquis or other medications.  Not necessarily even interested in aspirin at this point.  I discussed the risk of stroke especially since she has had 1.  They understand the risks and would prefer to stay off medication.      Dyspnea on exertion (Chronic)    Better overall.  Had not done well with aggressive rate and rhythm control.  Doing much better on the Bystolic alone.      Dyslipidemia (Chronic)    At this point LDL 107 on no medications as acceptable.  Her intolerance to medications makes it very difficult to manage her.  We talked about it in the visit mostly with her husband at.  He agrees that he referred to try to avoid aggressive management with additional meds.  Keep active.       ===================================  HPI:    Marissa [name] is a 77 y.o. female with a PMH  below who presents today for 70-month follow-up. She returns at the request of Marissa [name], *.  Pertinent PMH: PAF-relatively new diagnosis -> diagnosed by ILR placed for CVA in October 2022 -> explanted 07/19/2022 Initially stated on AAD 2/2 fatigue & dizziness while in Afib -- not aware of irregular Heartbeats. Intolerant of Amiodarone & Tikosyn (Torsades des Pointes During Tikosyn Load). CHADSVasc~7 - Xarelto 15 mg. ->  No longer taking Currently on Bystolic 10 mg daily for rate control; Not taking DOAC. CVA October 2022 Labile HTN-on standing dose of losartan and Bystolic with PRN clonidine (now using 1/2 tablet as needed SBP> 160 (10) On 100 mg daily losartan, 10 mg daily Bystolic and as needed clonidine for SBP> 160 mmHg. Mild AS with mild thoracic aortic  dilation Limited Echo 04/2022:   EF 60 to 65%.  Normal LV function.  No RWMA.  Mild LVH.  Moderately reduced RV function.  Moderate RV dilation.  Mild bi-atrial enlargement-normal RAP.[name] Kitchen  Functionally bicuspid aortic valve (velocity is not measured).  Ascending aorta measured 43 mm. HLD with intolerance to medications.  No longer on either Nexletol or ezetimibe.  Has not been able to tolerate statins either.  Severe balance issues - still with falls -- usually has someone with her to go places.  Dementia. Hypothyroidism. Seizures -> most recent episode 02/11/2023 Pulmonary nodule => s/p Bronchoscopy Bx 01/17/2023  Multiple medication intolerances including statins, Nexletol, ezetimibe, amiodarone and now Tikosyn  Marissa [name] was last seen on September 02, 2022-> most of the history was provided by her husband (Marissa [name]).  She was much more confused and having a difficult time providing history.  Perseverating on pain from her loop recorder, and the C. difficile hospitalization.  Chest pain was better.  Refused to take Xarelto, citing bruising.  Her husband was very worried about her labile blood pressures stating sometimes the mornings are as high as 180/80 but then also can be low as well.  Her BP was 150/73 but she was very emotional so we did not recheck.  She noted irregular heartbeats but not really sure about A-fib.  GI issues resolved.  Still with generalized weakness. Started losartan 50 mg daily with plans to potentially convert to 25 mg twice daily -> this subsequently has been increased to 100 mg daily. Referred to Dr. Dorris [name] to discuss pulmonary nodule.  Recent Hospitalizations:  01/17/23: Bronchoscopy -> for lung nodule biopsy. Seen by Dr. Dorris [name] on 01/29/2023.  Biopsy results were negative by additional stains.  Culture still pending.  But unable to rule out cancer Plan was to follow-up in 2 to 3 months after CT chest 02/11/2023: ER- Hosp - Seizure -> episode of nonresponsiveness  while sitting in chair.  Complained of right hand pain and eyes rolled back in her head and she became unresponsive.  Had a witnessed episode in the ER. Started on Keppra.  Noted inconsistency with taking Xarelto. (Not taking -- per Cardiology)  Reviewed  CV studies:    The following studies were reviewed today: (if available, images/films reviewed: From Epic Chart or Care Everywhere) No new studies:  Interval History:   Marissa [name] returns here today with her husband.  She mostly notes having a lot of bruising but better being off Xarelto and Eliquis.  She now pretty much refuses to take.  Marissa [name] continues to be very confused and a poor historian.  Her husband provides most of the salient information.  She seems to be doing fine overall  from cardiac standpoint not really complaining that much of any irregular heartbeats or palpitations.  Happy that her bruising is better but still is frustrated by it. No complaints of PND, orthopnea or edema.  No chest pain or pressure at rest or exertion.  Still has labile blood pressures but they are getting familiar with what to do with the clonidine and probably not having that frequently.  The increased dose of losartan to 100 mg seems to be helping.  She had been taking it 50 mg twice daily but is now taking 100 mg nightly with then Bystolic in the morning.  I reiterated my concern is that her pressure is too low, not as concerned about blood pressures in the 160-180 range.  She overall is in good spirits and has not noticed any significant issues with lightheadedness or dizziness beyond her normal poor balance.  No syncope or near syncope.  No TIA or amaurosis fugax.  Not sure how much she walks but has not complained of claudication.  REVIEWED OF SYSTEMS   Review of Systems  Constitutional:  Positive for malaise/fatigue (Not very active). Negative for weight loss.  HENT:  Negative for congestion.   Respiratory:  Positive for cough (Occasionally but  nothing significant). Negative for shortness of breath.   Gastrointestinal:  Negative for blood in stool and melena.  Genitourinary:  Negative for hematuria (Not currently.).  Musculoskeletal:  Positive for back pain, joint pain and myalgias.  Neurological:  Positive for dizziness and weakness.  Endo/Heme/Allergies:  Bruises/bleeds easily.  Psychiatric/Behavioral:  Positive for memory loss. Negative for depression (Not sure if she is totally depressed but seems to be much more confused). The patient is nervous/anxious and has insomnia.     I have reviewed and (if needed) personally updated the patient's problem list, medications, allergies, past medical and surgical history, social and family history.   PAST MEDICAL HISTORY   Past Medical History:  Diagnosis Date   Anxiety    Arthritis    Asthma    Bicuspid aortic valve 05/2017   Likely functional bicuspid aortic valve with sclerosis and no stenosis.   Bursitis    Cardiac arrest (HCC)    x 2 (within an hour) June 2023   Cataract    mild   Cognitive change    Dyspnea    occasional   Dysrhythmia    A-fib   Dysthymia    GERD (gastroesophageal reflux disease)    Hemorrhoids    Hx of ulcerative colitis    per dr Lovell Sheehan as per pt.   Hyperlipidemia    Hypothyroidism    Labile hypertension    managed - labile.   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 Dorris [name])   Thyroid disease     PAST SURGICAL HISTORY   Past Surgical History:  Procedure Laterality Date   ABDOMINAL HYSTERECTOMY     BREAST EXCISIONAL BIOPSY Left    BUNIONECTOMY     Cardiac Event Monitor  07/2017   Overall relatively normal.  Normal sinus rhythm with rare  bradycardia and tachycardia.  Heart rate ranged from 55-110 bpm.  Occasional PACs  and PVCs, every single 1 was felt.  No arrhythmias other than one short 4 beat run of PACs.   COLONOSCOPY  02/04/2005   all normal    FOOT SURGERY     3 pins in toes    HAND SURGERY     left thumb joint resection   LOOP RECORDER INSERTION N/A 08/09/2021   Procedure: LOOP RECORDER INSERTION;  Surgeon: Marissa Prude, MD;  Location: [facility];  Service: Cardiovascular;  Laterality: N/A;   nasal revision     NM MYOVIEW LTD  07/2017   LOW RISK study.  No ischemia or infarction.  EF greater than 65%.    SINUS IRRIGATION     TONSILLECTOMY AND ADENOIDECTOMY     TRANSTHORACIC ECHOCARDIOGRAM  06/'1, 8/'19   a) Moderate LVH. Normal EF 60-65%. Normal diastolic parameters. --> Difficult to fully visualize the aortic valve. Cannot exclude bicuspid valve. Mild aortic stenosis noted. No PFO. Mildly dilated left atrium. Trivial MR. No comment on mitral valve prolapse. Moderately dilated ascending aorta.;; b) F/u Echo To evaluate the aortic valve. -- Bicuspid AoV - mildly thickened / calcified. - No stenosis   TRANSTHORACIC ECHOCARDIOGRAM  08/08/2021   EF 60 to 65%.  Normal diastolic parameters?.   ?  Functional bicuspid aortic valve with R&L cusp sommisure.  Very sclerotic.  Stable gradients ~mean gradient 10 mmHg. Very Mild stenosis. Aorta dilated to 42 mm.  Stable.  RAP 3 mmHg   TRANSTHORACIC ECHOCARDIOGRAM  05/16/2022   EF 60 to 65%.  No edema.  Mild LVH.  Moderately Story function.  Mild by atrial enlargement.  Normal RAP for functionally bicuspid aortic valve.Aorta 43 mm.   VIDEO BRONCHOSCOPY WITH ENDOBRONCHIAL NAVIGATION N/A 01/17/2023   Procedure: VIDEO BRONCHOSCOPY WITH ENDOBRONCHIAL NAVIGATION;  Surgeon: Loreli Slot, MD;  Location: [facility] OR;  Service: Thoracic;  Laterality: N/A;   MRI Angio Chest 08/28/2022: Stable Asc Ao dilation 4.3 cm.   Noncardiac: Irregular ground-glass lesion again noted in the  superior right upper lobe which demonstrates probable enlargement since the prior chest CT in November, although exact measurements by MRI may be difficult compared to CT. Maximal transverse dimensions are approximately 2.1 x 2.5 cm. Findings remain suspicious for potential adenocarcinoma. Recommend follow-up CT and referral to Pulmonology.    MEDICATIONS/ALLERGIES   Current Meds  Medication Sig   ALPRAZolam (XANAX) 0.5 MG tablet Take 1 tablet (0.5 mg total) by mouth 2 (two) times daily as needed for anxiety.   cloNIDine (CATAPRES) 0.2 MG tablet Take 0.1 mg by mouth 2 (two) times daily as needed (systolic over 160). if blood pressure is great than 160 systolic for more than an hour (Patient taking differently: Take 0.1 mg by mouth 2 (two) times daily as needed (sBP > 160 for over 1 hour).)   Eszopiclone 3 MG TABS Take 1 tablet (3 mg total) by mouth at bedtime.   FLUoxetine (PROZAC) 20 MG tablet Take 2 tablets (40 mg total) by mouth daily.   HYDROcodone-acetaminophen (NORCO/VICODIN) 5-325 MG tablet Take 1 tablet by mouth daily as needed for moderate pain.   latanoprost (XALATAN) 0.005 % ophthalmic solution Place 1 drop into both eyes at bedtime.   levETIRAcetam (KEPPRA) 500 MG tablet Take 1 tablet (500 mg total) by mouth 2 (two) times daily.   levothyroxine (SYNTHROID) 125 MCG tablet TAKE 1 TABLET(125 MCG) BY MOUTH DAILY (Patient taking differently: Take 125 mcg by mouth daily.)   losartan (COZAAR) 100 MG tablet Take 1 tablet (100 mg  total) by mouth daily. (Patient taking differently: Take 100 mg by mouth at bedtime.)   magnesium oxide (MAG-OX) 400 MG tablet Take 1 tablet (400 mg total) by mouth 2 (two) times daily. (Patient taking differently: Take 400 mg by mouth daily.)   montelukast (SINGULAIR) 10 MG tablet Take 1 tablet (10 mg total) by mouth at bedtime.   Multiple Vitamin (MULTIVITAMIN WITH MINERALS) TABS tablet Take 1 tablet by mouth daily.   naproxen sodium (ALEVE) 220 MG tablet Take 440 mg  by mouth daily as needed (pain).   nebivolol (BYSTOLIC) 10 MG tablet TAKE 1 ZOXWRU(04 MG) BY MOUTH DAILY AT 8 PM (Patient taking differently: Take 10 mg by mouth at bedtime.)   Respiratory Therapy Supplies (FLUTTER) DEVI Use as directed   triamcinolone cream (KENALOG) 0.1 % Apply 1 Application topically 2 (two) times daily. (Patient taking differently: Apply 1 Application topically 2 (two) times daily as needed (irritation).)    Allergies  Allergen Reactions   Crestor [Rosuvastatin] Anaphylaxis, Hives, Shortness Of Breath, Itching, Photosensitivity, Nausea And Vomiting, Anxiety, Palpitations and Other (See Comments)   Duragesic-100 [Fentanyl] Shortness Of Breath and Rash   Pacerone [Amiodarone] Nausea Only and Other (See Comments)    Ataxia  Weakness  Chills Dyspnea  Near syncope Chest pain   Tikosyn [Dofetilide] Other (See Comments)    Torsade and cardiac arrest during dofetilide initiation   Apresoline [Hydralazine] Other (See Comments)    Weakness  Sweats Skin redness   Hctz [Hydrochlorothiazide] Other (See Comments)    Hyponatremia   Oxycontin [Oxycodone] Other (See Comments)    Unknown reaction   Amoxil [Amoxicillin] Diarrhea, Swelling and Rash    Rash to vaginal area, including swelling    Codeine Other (See Comments)    Hyperactivity    Conjugated Estrogens Itching and Rash   Erythromycin Rash    Reaction to E-mycin Has done ok with other medications in this class   Feldene [Piroxicam] Itching and Rash    Reaction to brand name Feldene    SOCIAL HISTORY/FAMILY HISTORY   Reviewed in Epic:  Pertinent findings:  Social History   Tobacco Use   Smoking status: Never    Passive exposure: Never   Smokeless tobacco: Never   Tobacco comments:    Never smoke 04/23/22  Vaping Use   Vaping Use: Never used  Substance Use Topics   Alcohol use: Not Currently    Alcohol/week: 7.0 standard drinks of alcohol    Types: 7 Shots of liquor per week    Comment: none   Drug  use: No   Social History   Social History Narrative   Right handed    Lives with husband     OBJCTIVE -PE, EKG, labs   Wt Readings from Last 3 Encounters:  03/10/23 126 lb (57.2 kg)  03/03/23 130 lb 3.2 oz (59.1 kg)  02/11/23 130 lb 1.1 oz (59 kg)    Physical Exam: BP 138/76   Pulse 62   Ht 5\' 4"  (1.626 m)   Wt 130 lb 3.2 oz (59.1 kg)   SpO2 97%   BMI 22.35 kg/m  Physical Exam Vitals reviewed.  Constitutional:      General: She is not in acute distress.    Appearance: She is normal weight. She is ill-appearing (Chronic ill-appearing.  Frail). She is not toxic-appearing.     Comments: Somewhat weak and frail elderly woman.  Notably more confused than before.  Well-groomed.  She walked in here today as opposed to wheelchair.  HENT:     Head: Normocephalic and atraumatic.  Neck:     Vascular: Carotid bruit (Radiated murmur.) present. No JVD.  Cardiovascular:     Rate and Rhythm: Normal rate and regular rhythm. Occasional Extrasystoles are present.    Chest Wall: PMI is not displaced.     Pulses: Normal pulses.     Heart sounds: S1 normal and S2 normal. Murmur (1/6 SEM at RUSB) heard.     No friction rub. No gallop.  Pulmonary:     Effort: Pulmonary effort is normal. No respiratory distress.     Breath sounds: Normal breath sounds. No wheezing, rhonchi (Occasionally) or rales.  Chest:     Chest wall: Tenderness present.  Musculoskeletal:        General: No swelling. Normal range of motion.     Cervical back: Normal range of motion and neck supple.  Skin:    General: Skin is warm and dry.     Coloration: Skin is pale.     Findings: Bruising (Senile purpura) present.  Neurological:     General: No focal deficit present.     Mental Status: She is alert. Mental status is at baseline.     Motor: No weakness.     Gait: Gait abnormal.     Comments: Aware of person and place but not time.;  I think this is a new baseline.  But actually stable/improved compared to last  visit.  Psychiatric:     Comments: Today she is in a good mood.  Less labile.  Does not answer questions very accurately..      Adult ECG Report -> not performed today.  Reviewed from4/16/2024  Rate: 70;  Rhythm: normal sinus rhythm and borderline left axis deviation. ; ~Q waves in V1 and V2 suggest possible anteroseptal MI, age-indeterminate.  Borderline QT.    Narrative Interpretation: Compared to July 2023, lateral ST segment depression is no longer noted.  PVCs no longer noted.  Recent Labs: Reviewed. Lab Results  Component Value Date   CHOL 215 (H) 08/20/2022   HDL 67 08/20/2022   LDLCALC 107 (H) 08/20/2022   LDLDIRECT 146.0 11/21/2020   TRIG 239 (H) 08/20/2022   CHOLHDL 3.2 08/20/2022   Lab Results  Component Value Date   CREATININE 0.81 02/13/2023   BUN 12 02/13/2023   NA 139 02/13/2023   K 3.6 02/13/2023   CL 108 02/13/2023   CO2 20 (L) 02/13/2023      Latest Ref Rng & Units 02/13/2023    3:27 AM 02/12/2023    5:34 AM 02/11/2023    2:22 PM  CBC  WBC 4.0 - 10.5 K/uL 5.6  6.5    Hemoglobin 12.0 - 15.0 g/dL 82.9  56.2  13.0   Hematocrit 36.0 - 46.0 % 32.5  33.6  42.0   Platelets 150 - 400 K/uL 141  179      Lab Results  Component Value Date   HGBA1C 5.1 08/07/2021   Lab Results  Component Value Date   TSH 4.872 (H) 02/11/2023    ================================================== I spent a total of 32 minutes with the patient spent in direct patient consultation.  Additional time spent with chart review  / charting (studies, outside notes, etc):  24 min Total Time: 56 min  Current medicines are reviewed at length with the patient today.  (+/- concerns) none  Notice: This dictation was prepared with Dragon dictation along with smart phrase technology. Any transcriptional errors that result from this process  are unintentional and may not be corrected upon review.  Studies Ordered:   No orders of the defined types were placed in this encounter.  No orders  of the defined types were placed in this encounter.   Patient Instructions / Medication Changes & Studies & Tests Ordered   Patient Instructions  Medication Instructions:   No changes  *If you need a refill on your cardiac medications before your next appointment, please call your pharmacy*   Lab Work: Not needed    Testing/Procedures:  Not needed  Follow-Up: At [facility], you and your health needs are our priority.  As part of our continuing mission to provide you with exceptional heart care, we have created designated Provider Care Teams.  These Care Teams include your primary Cardiologist (physician) and Advanced Practice Providers (APPs -  Physician Assistants and Nurse Practitioners) who all work together to provide you with the care you need, when you need it.     Your next appointment:   6 month(s)  The format for your next appointment:   In Person  Provider:   Bryan Lemma, MD       Marykay Lex, MD, MS Marissa [name], M.D., M.S. Interventional Cardiologist  [facility]  Pager # [phone] Phone # [phone] [address] [place], [place] [address]   Thank you for choosing  [facility] [place]!!

## 2023-03-10 ENCOUNTER — Encounter: Payer: Self-pay | Admitting: Physician Assistant

## 2023-03-10 ENCOUNTER — Ambulatory Visit: Payer: Medicare Other | Admitting: Physician Assistant

## 2023-03-10 VITALS — BP 166/88 | HR 46 | Ht 64.5 in | Wt 126.0 lb

## 2023-03-10 DIAGNOSIS — G309 Alzheimer's disease, unspecified: Secondary | ICD-10-CM | POA: Diagnosis not present

## 2023-03-10 DIAGNOSIS — F0280 Dementia in other diseases classified elsewhere without behavioral disturbance: Secondary | ICD-10-CM | POA: Diagnosis not present

## 2023-03-10 MED ORDER — LEVETIRACETAM 500 MG PO TABS: 500.0000 mg | ORAL_TABLET | Freq: Two times a day (BID) | ORAL | 3 refills | Status: DC
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
## 2023-03-10 NOTE — Patient Instructions (Signed)
Follow up with primary  anemia since you are reporting dark stools , may need a GI evaluation  No NSAIDS Continue Keppra 500 mg twice a day  Monitor the L arm   Follow up in 6 months    FALL PRECAUTIONS: Be cautious when walking. Scan the area for obstacles that may increase the risk of trips and falls. When getting up in the mornings, sit up at the edge of the bed for a few minutes before getting out of bed. Consider elevating the bed at the head end to avoid drop of blood pressure when getting up. Walk always in a well-lit room (use night lights in the walls). Avoid area rugs or power cords from appliances in the middle of the walkways. Use a walker or a cane if necessary and consider physical therapy for balance exercise. Get your eyesight checked regularly.  FINANCIAL OVERSIGHT: Supervision, especially oversight when making financial decisions or transactions is also recommended as difficulties arise.  HOME SAFETY: Consider the safety of the kitchen when operating appliances like stoves, microwave oven, and blender. Consider having supervision and share cooking responsibilities until no longer able to participate in those. Accidents with firearms and other hazards in the house should be identified and addressed as well.  DRIVING: Regarding driving, in patients with progressive memory problems, driving will be impaired. We advise to have someone else do the driving if trouble finding directions or if minor accidents are reported. Independent driving assessment is available to determine safety of driving.  ABILITY TO BE LEFT ALONE: If patient is unable to contact 911 operator, consider using LifeLine, or when the need is there, arrange for someone to stay with patients. Smoking is a fire hazard, consider supervision or cessation. Risk of wandering should be assessed by caregiver and if detected at any point, supervision and safe proof recommendations should be instituted.  MEDICATION SUPERVISION:  Inability to self-administer medication needs to be constantly addressed. Implement a mechanism to ensure safe administration of the medications.  RECOMMENDATIONS FOR ALL PATIENTS WITH MEMORY PROBLEMS: 1. Continue to exercise (Recommend 30 minutes of walking everyday, or 3 hours every week) 2. Increase social interactions - continue going to [place] and enjoy social gatherings with friends and family 3. Eat healthy, avoid fried foods and eat more fruits and vegetables 4. Maintain adequate blood pressure, blood sugar, and blood cholesterol level. Reducing the risk of stroke and cardiovascular disease also helps promoting better memory. 5. Avoid stressful situations. Live a simple life and avoid aggravations. Organize your time and prepare for the next day in anticipation. 6. Sleep well, avoid any interruptions of sleep and avoid any distractions in the bedroom that may interfere with adequate sleep quality 7. Avoid sugar, avoid sweets as there is a strong link between excessive sugar intake, diabetes, and cognitive impairment We discussed the Mediterranean diet, which has been shown to help patients reduce the risk of progressive memory disorders and reduces cardiovascular risk. This includes eating fish, eat fruits and green leafy vegetables, nuts like almonds and hazelnuts, walnuts, and also use olive oil. Avoid fast foods and fried foods as much as possible. Avoid sweets and sugar as sugar use has been linked to worsening of memory function.  There is always a concern of gradual progression of memory problems. If this is the case, then we may need to adjust level of care according to patient needs. Support, both to the patient and caregiver, should then be put into place.

## 2023-03-11 ENCOUNTER — Other Ambulatory Visit: Payer: Self-pay | Admitting: Internal Medicine

## 2023-03-11 DIAGNOSIS — E039 Hypothyroidism, unspecified: Secondary | ICD-10-CM

## 2023-03-12 ENCOUNTER — Other Ambulatory Visit: Payer: Self-pay | Admitting: Thoracic Surgery (Cardiothoracic Vascular Surgery)

## 2023-03-12 DIAGNOSIS — R911 Solitary pulmonary nodule: Secondary | ICD-10-CM

## 2023-03-15 ENCOUNTER — Encounter: Payer: Self-pay | Admitting: Cardiology

## 2023-03-15 NOTE — Assessment & Plan Note (Signed)
Better overall.  Had not done well with aggressive rate and rhythm control.  Doing much better on the Bystolic alone.

## 2023-03-15 NOTE — Assessment & Plan Note (Addendum)
No longer on any antiarrhythmic agents.  Only on beta-blocker.  I think a lot of this has to do with her being acutely ill and multiple different attempted medications;  Somewhat concerning with her being on Keppra.  Need to monitor for signs symptoms of potential syncope

## 2023-03-15 NOTE — Assessment & Plan Note (Signed)
BP seems to be somewhat stable now.  I think we have a stable regimen with a total of 100 mg losartan +10 mg of Bystolic from baseline.  Reluctant to use diuretic because of her dizziness and tendency for orthostasis.  As needed clonidine for sustained pressures greater than 160 mmHg.  This means 2 consecutive readings 1 hour apart.

## 2023-03-15 NOTE — Assessment & Plan Note (Signed)
He seems to be in sinus rhythm today.  Not aware of being in or out of A-fib. Despite the fact that she had a stroke with a CHA2DS2-VASc score 7, she pretty much refuses to take Xarelto.  At this point, I tried to discuss potentially going on aspirin but is due for to take medications.  For now we will simply continue Bystolic for rate control.  No more AAD options. Maybe she will go back to taking aspirin, but will defer to their decision.

## 2023-03-15 NOTE — Assessment & Plan Note (Signed)
Has been stable.  Is following up with Dr. Dorris Fetch.

## 2023-03-15 NOTE — Assessment & Plan Note (Signed)
Against my recommendations, she has decided that she does not want to take Xarelto or Eliquis or other medications.  Not necessarily even interested in aspirin at this point.  I discussed the risk of stroke especially since she has had 1.  They understand the risks and would prefer to stay off medication.

## 2023-03-15 NOTE — Assessment & Plan Note (Addendum)
At this point LDL 107 on no medications as acceptable.  Her intolerance to medications makes it very difficult to manage her.  We talked about it in the visit mostly with her husband at.  He agrees that he referred to try to avoid aggressive management with additional meds.  Keep active.

## 2023-03-15 NOTE — Assessment & Plan Note (Signed)
Based on results of echo back in July 2023, I think we can probably wait a couple years before used to be reassessed.

## 2023-03-27 ENCOUNTER — Inpatient Hospital Stay: Admission: RE | Admit: 2023-03-27 | Payer: Medicare Other | Source: Ambulatory Visit

## 2023-04-01 ENCOUNTER — Ambulatory Visit: Payer: Medicare Other | Admitting: Thoracic Surgery (Cardiothoracic Vascular Surgery)

## 2023-04-16 ENCOUNTER — Encounter (HOSPITAL_COMMUNITY): Payer: Self-pay | Admitting: Emergency Medicine

## 2023-04-16 ENCOUNTER — Other Ambulatory Visit: Payer: Self-pay

## 2023-04-16 ENCOUNTER — Emergency Department (HOSPITAL_COMMUNITY)
Admission: EM | Admit: 2023-04-16 | Discharge: 2023-04-16 | Disposition: A | Discharge status: Home / Self Care | Payer: Medicare Other | Attending: Emergency Medicine | Admitting: Emergency Medicine

## 2023-04-16 ENCOUNTER — Emergency Department (HOSPITAL_COMMUNITY): Payer: Medicare Other

## 2023-04-16 DIAGNOSIS — Z79899 Other long term (current) drug therapy: Secondary | ICD-10-CM | POA: Diagnosis not present

## 2023-04-16 DIAGNOSIS — R079 Chest pain, unspecified: Secondary | ICD-10-CM | POA: Insufficient documentation

## 2023-04-16 DIAGNOSIS — E039 Hypothyroidism, unspecified: Secondary | ICD-10-CM | POA: Diagnosis not present

## 2023-04-16 DIAGNOSIS — R0602 Shortness of breath: Secondary | ICD-10-CM | POA: Insufficient documentation

## 2023-04-16 DIAGNOSIS — Z5329 Procedure and treatment not carried out because of patient's decision for other reasons: Secondary | ICD-10-CM | POA: Diagnosis not present

## 2023-04-16 DIAGNOSIS — R0689 Other abnormalities of breathing: Secondary | ICD-10-CM | POA: Diagnosis not present

## 2023-04-16 DIAGNOSIS — R202 Paresthesia of skin: Secondary | ICD-10-CM | POA: Diagnosis not present

## 2023-04-16 DIAGNOSIS — Z8673 Personal history of transient ischemic attack (TIA), and cerebral infarction without residual deficits: Secondary | ICD-10-CM | POA: Diagnosis not present

## 2023-04-16 DIAGNOSIS — R0789 Other chest pain: Secondary | ICD-10-CM | POA: Diagnosis not present

## 2023-04-16 DIAGNOSIS — I10 Essential (primary) hypertension: Secondary | ICD-10-CM | POA: Diagnosis not present

## 2023-04-16 DIAGNOSIS — R000 Tachycardia, unspecified: Secondary | ICD-10-CM | POA: Diagnosis not present

## 2023-04-16 DIAGNOSIS — Z79890 Hormone replacement therapy (postmenopausal): Secondary | ICD-10-CM | POA: Insufficient documentation

## 2023-04-16 DIAGNOSIS — J45909 Unspecified asthma, uncomplicated: Secondary | ICD-10-CM | POA: Insufficient documentation

## 2023-04-16 LAB — TROPONIN I (HIGH SENSITIVITY): Troponin I (High Sensitivity): 9 ng/L (ref <18)

## 2023-04-16 LAB — CBC WITH DIFFERENTIAL/PLATELET
Abs Immature Granulocytes: 0.03 10*3/uL (ref 0.00–0.07)
Basophils Absolute: 0.1 10*3/uL (ref 0.0–0.1)
Basophils Relative: 1 %
Eosinophils Absolute: 0.2 10*3/uL (ref 0.0–0.5)
Eosinophils Relative: 3 %
HCT: 40.6 % (ref 36.0–46.0)
Hemoglobin: 14.1 g/dL (ref 12.0–15.0)
Immature Granulocytes: 0 %
Lymphocytes Relative: 23 %
Lymphs Abs: 1.6 10*3/uL (ref 0.7–4.0)
MCH: 31.5 pg (ref 26.0–34.0)
MCHC: 34.7 g/dL (ref 30.0–36.0)
MCV: 90.8 fL (ref 80.0–100.0)
Monocytes Absolute: 0.7 10*3/uL (ref 0.1–1.0)
Monocytes Relative: 10 %
Neutro Abs: 4.3 10*3/uL (ref 1.7–7.7)
Neutrophils Relative %: 63 %
Platelets: 235 10*3/uL (ref 150–400)
RBC: 4.47 MIL/uL (ref 3.87–5.11)
RDW: 13 % (ref 11.5–15.5)
WBC: 6.8 10*3/uL (ref 4.0–10.5)
nRBC: 0 % (ref 0.0–0.2)

## 2023-04-16 LAB — TSH: TSH: 1.271 u[IU]/mL (ref 0.350–4.500)

## 2023-04-16 LAB — COMPREHENSIVE METABOLIC PANEL
ALT: 24 U/L (ref 0–44)
AST: 33 U/L (ref 15–41)
Albumin: 4.2 g/dL (ref 3.5–5.0)
Alkaline Phosphatase: 51 U/L (ref 38–126)
Anion gap: 16 — ABNORMAL HIGH (ref 5–15)
BUN: 15 mg/dL (ref 8–23)
CO2: 19 mmol/L — ABNORMAL LOW (ref 22–32)
Calcium: 9.3 mg/dL (ref 8.9–10.3)
Chloride: 101 mmol/L (ref 98–111)
Creatinine, Ser: 1.05 mg/dL — ABNORMAL HIGH (ref 0.44–1.00)
GFR, Estimated: 54 mL/min — ABNORMAL LOW (ref >60)
Glucose, Bld: 103 mg/dL — ABNORMAL HIGH (ref 70–99)
Potassium: 3.6 mmol/L (ref 3.5–5.1)
Sodium: 136 mmol/L (ref 135–145)
Total Bilirubin: 0.9 mg/dL (ref 0.3–1.2)
Total Protein: 7 g/dL (ref 6.5–8.1)

## 2023-04-16 LAB — BRAIN NATRIURETIC PEPTIDE: B Natriuretic Peptide: 411.9 pg/mL — ABNORMAL HIGH (ref 0.0–100.0)

## 2023-04-16 LAB — T4, FREE: Free T4: 0.96 ng/dL (ref 0.61–1.12)

## 2023-04-16 MED ORDER — ALPRAZOLAM 0.25 MG PO TABS
0.5000 mg | ORAL_TABLET | Freq: Once | ORAL | Status: AC
  Administered 2023-04-16: 0.5 mg via ORAL
  Filled 2023-04-16: qty 2

## 2023-04-16 NOTE — ED Notes (Signed)
Pt signed AMA electronically.  Husband at bedside.

## 2023-04-16 NOTE — ED Triage Notes (Signed)
Pt BIB [facility] from home due to chest pain and SHOB for the past two days.  Pt reports neck pain and that she also fell yesterday on ceramic floor.  Pt has not taken Xanax today and very anxious.  VS BP 160/110 HR 110, SpO2 98%, CBG 114.  20g left AC.  NS.

## 2023-04-16 NOTE — ED Provider Notes (Signed)
[place] EMERGENCY DEPARTMENT AT [facility] Provider Note   CSN: [id number] Arrival date & time: 04/16/23  1354     History  Chief Complaint  Patient presents with   Chest Pain    Marissa [name] is a 77 y.o. female with persistent A-fib, hypothyroidism, asthma, HLD, HTN, dilation of ascending aorta, history of CVA, OAB, seizures who presents BIB [facility] from home due to chest pain and SHOB for the past two days, worsening at 12 pm today. Pt reports neck pain and that she also fell yesterday on ceramic floor. Pt has not taken Xanax today and very anxious.   On my evaluation of the patient, patient is extremely agitated and yelling at her husband and I before I am able to ask her questions.  I am unable to obtain any additional history other than what was reported in the triage note as patient is yelling over me and screaming that nobody has cared for her today.  She is screaming, "look what your people have done to me.  I was in here and could not breathe and nobody did anything.  I want to leave this hospital, get me out of this place.  This is all your fault!" Patient does not calm down with the coaxings of her husband and is observed to be extremely verbally aggressive towards him.    Chest Pain      Home Medications Prior to Admission medications   Medication Sig Start Date End Date Taking? Authorizing Provider  ALPRAZolam Prudy Feeler) 0.5 MG tablet Take 1 tablet (0.5 mg total) by mouth 2 (two) times daily as needed for anxiety. 11/26/22   Myrlene Broker, MD  cloNIDine (CATAPRES) 0.2 MG tablet Take 0.1 mg by mouth 2 (two) times daily as needed (systolic over 160). if blood pressure is great than 160 systolic for more than an hour Patient taking differently: Take 0.1 mg by mouth 2 (two) times daily as needed (sBP > 160 for over 1 hour). 12/17/22   Marykay Lex, MD  Eszopiclone 3 MG TABS Take 1 tablet (3 mg total) by mouth at bedtime. 10/07/22   Myrlene Broker,  MD  FLUoxetine (PROZAC) 20 MG tablet Take 2 tablets (40 mg total) by mouth daily. 02/05/23   Myrlene Broker, MD  HYDROcodone-acetaminophen (NORCO/VICODIN) 5-325 MG tablet Take 1 tablet by mouth daily as needed for moderate pain. 02/28/23   Myrlene Broker, MD  latanoprost (XALATAN) 0.005 % ophthalmic solution Place 1 drop into both eyes at bedtime. 08/16/22   [provider]  levothyroxine (SYNTHROID) 125 MCG tablet TAKE 1 TABLET(125 MCG) BY MOUTH DAILY 03/12/23   Myrlene Broker, MD  losartan (COZAAR) 100 MG tablet Take 1 tablet (100 mg total) by mouth daily. Patient taking differently: Take 100 mg by mouth at bedtime. 01/23/23   Marykay Lex, MD  magnesium oxide (MAG-OX) 400 MG tablet Take 1 tablet (400 mg total) by mouth 2 (two) times daily. Patient taking differently: Take 400 mg by mouth daily. 05/15/22   Myrlene Broker, MD  montelukast (SINGULAIR) 10 MG tablet Take 1 tablet (10 mg total) by mouth at bedtime. 02/28/23   Myrlene Broker, MD  Multiple Vitamin (MULTIVITAMIN WITH MINERALS) TABS tablet Take 1 tablet by mouth daily. 05/24/22   Hongalgi, Maximino Greenland, MD  naproxen sodium (ALEVE) 220 MG tablet Take 440 mg by mouth daily as needed (pain).    [provider]  nebivolol (BYSTOLIC) 10 MG tablet  TAKE 1 TABLET(10 MG) BY MOUTH DAILY AT 8 PM Patient taking differently: Take 10 mg by mouth at bedtime. 09/16/22   Marykay Lex, MD  Respiratory Therapy Supplies (FLUTTER) DEVI Use as directed 11/18/16   Nyoka Cowden, MD  triamcinolone cream (KENALOG) 0.1 % Apply 1 Application topically 2 (two) times daily. Patient taking differently: Apply 1 Application topically 2 (two) times daily as needed (irritation). 06/11/22   Myrlene Broker, MD      Allergies    Crestor [rosuvastatin], Duragesic-100 [fentanyl], Pacerone [amiodarone], Tikosyn [dofetilide], Apresoline [hydralazine], Hctz [hydrochlorothiazide], Oxycontin [oxycodone], Amoxil [amoxicillin],  Codeine, Conjugated estrogens, Erythromycin, and Feldene [piroxicam]    Review of Systems   Review of Systems  Unable to perform ROS: Other  Cardiovascular:  Positive for chest pain.  Patient refuses to answer further questions.   Physical Exam Updated Vital Signs BP (!) 154/105 (BP Location: Right Arm)   Pulse (!) 115   Temp 97.9 F (36.6 C) (Oral)   Resp 17   Ht 5\' 4"  (1.626 m)   Wt 56.2 kg   SpO2 100%   BMI 21.28 kg/m  Physical Exam General: Elderly appearing female, lying in bed.  HEENT: Sclera anicteric, MMM, trachea midline.  Cardiology: RRR, no murmurs/rubs/gallops. BL radial and DP pulses equal bilaterally.  Resp: Normal respiratory rate and effort. CTAB, no wheezes, rhonchi, crackles.  Abd: Soft, non-tender, non-distended. No rebound tenderness or guarding.  GU: Deferred. MSK: No peripheral edema or signs of trauma. Extremities without deformity or TTP. No cyanosis or clubbing. Skin: warm, dry.  Neuro: A&Ox3, CNs II-XII grossly intact. 5/5 strength all extremities. Sensation grossly intact.  Psych: Extremely agitated, yelling at husband and myself.   ED Results / Procedures / Treatments   Labs (all labs ordered are listed, but only abnormal results are displayed) Labs Reviewed  COMPREHENSIVE METABOLIC PANEL - Abnormal; Notable for the following components:      Result Value   CO2 19 (*)    Glucose, Bld 103 (*)    Creatinine, Ser 1.05 (*)    GFR, Estimated 54 (*)    Anion gap 16 (*)    All other components within normal limits  BRAIN NATRIURETIC PEPTIDE - Abnormal; Notable for the following components:   B Natriuretic Peptide 411.9 (*)    All other components within normal limits  CBC WITH DIFFERENTIAL/PLATELET  TSH  T4, FREE  TROPONIN I (HIGH SENSITIVITY)  TROPONIN I (HIGH SENSITIVITY)    EKG EKG Interpretation  Date/Time:  Wednesday April 16 2023 14:04:34 EDT Ventricular Rate:  126 PR Interval:  76 QRS Duration: 84 QT Interval:  349 QTC  Calculation: 506 R Axis:   -40 Text Interpretation: Sinus tachycardia with irregular rate Consider right atrial enlargement Left anterior fascicular block Prolonged QT interval Confirmed by Vivi Barrack [phone]) on 04/16/2023 4:19:21 PM  Radiology DG Chest Portable 1 View  Result Date: 04/16/2023 CLINICAL DATA:  cp EXAM: PORTABLE CHEST 1 VIEW COMPARISON:  CXR 02/11/23 FINDINGS: No pleural effusion. No pneumothorax. Normal cardiac and mediastinal contours. No radiographically apparent acute displaced rib fracture. There are chronic fractures of posterior left seventh and eighth ribs. Visualized upper abdomen is unremarkable. IMPRESSION: No acute pulmonary disease Electronically Signed   By: Lorenza Cambridge M.D.   On: 04/16/2023 15:04    Procedures Procedures    Medications Ordered in ED Medications  ALPRAZolam Prudy Feeler) tablet 0.5 mg (0.5 mg Oral Given 04/16/23 1420)    ED Course/ Medical Decision Making/ A&P  Medical Decision Making Amount and/or Complexity of Data Reviewed Labs:  Decision-making details documented in ED Course. Radiology:  Decision-making details documented in ED Course.    This patient presents to the ED for concern of chest pain, this involves an extensive number of treatment options, and is a complaint that carries with it a high risk of complications and morbidity.  I considered the following differential and admission for this acute, potentially life threatening condition.   MDM:    DDX for chest pain includes but is not limited to:  ACS/arrhythmia, PE, aortic dissection, PNA, PTX, esophogeal rupture, biliary disease, cardiac tamponade, pericarditis, GERD/PUD/gastritis, or musculoskeletal pain. Consider ACS and will get troponin. Patient cannot PERC out based on age, but no signs/symptoms of DVT on exam, will consider CT PE. Lower c/f dissection. No abdominal pain and no c/f biliary disease. Consider GERD/gastritis. Was given xanax on arrival  for anxiety which did not seem to help.   The patient's husband asked what results have returned.  I informed him of the reassuring workup so far but I informed the patient and her husband that I would recommend she stay for an additional troponin measurement.  Patient and her husband are also informed that her heart rate is mildly high, intermittently, though on my evaluation her rate is 90 bpm.  That also would warrant further evaluation for etiology such as arrhythmias, pulmonary embolus or pericarditis.  The patient does not want to stay and demonstrates that she understands that she could die as a result of leaving today but states that she would like to leave.  Patient does demonstrate capacity to me, and the husband is also in agreement that the patient has demonstrated capacity.  Patient is capable of making the decision of leaving, and she is extremely agitated requesting to leave multiple times.  Patient will be discharged AGAINST MEDICAL ADVICE.     Clinical Course as of 04/16/23 1652  Wed Apr 16, 2023  1620 Troponin I (High Sensitivity): 9 neg [HN]  1620 CBC with Differential wnl [HN]  1620 DG Chest Portable 1 View No acute pulmonary disease [HN]    Clinical Course User Index [HN] Loetta Rough, MD    Labs: I Ordered, and personally interpreted labs.  The pertinent results include:  those listed above  Imaging Studies ordered: Imaging studies including CXR I independently visualized and interpreted imaging. I agree with the radiologist interpretation  Additional history obtained from chart review, husband at bedside.    Cardiac Monitoring: The patient was maintained on a cardiac monitor.  I personally viewed and interpreted the cardiac monitored which showed an underlying rhythm of: NSR  Social Determinants of Health: Lives with husband  Disposition:  AMA  Co morbidities that complicate the patient evaluation  Past Medical History:  Diagnosis Date   Anxiety     Arthritis    Asthma    Bicuspid aortic valve 05/2017   Likely functional bicuspid aortic valve with sclerosis and no stenosis.   Bursitis    Cardiac arrest (HCC)    x 2 (within an hour) June 2023   Cataract    mild   Cognitive change    Dyspnea    occasional   Dysrhythmia    A-fib   Dysthymia    GERD (gastroesophageal reflux disease)    Hemorrhoids    Hx of ulcerative colitis    per dr Lovell Sheehan as per pt.   Hyperlipidemia    Hypothyroidism    Labile hypertension  managed - labile.   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 Dorris Fetch)   Thyroid disease      Medicines Meds ordered this encounter  Medications   ALPRAZolam (XANAX) tablet 0.5 mg    I have reviewed the patients home medicines and have made adjustments as needed  Problem List / ED Course: Problem List Items Addressed This Visit   None Visit Diagnoses     Chest pain, unspecified type    -  Primary                   This note was created using dictation software, which may contain spelling or grammatical errors.    Loetta Rough, MD 04/16/23 [phone]

## 2023-04-16 NOTE — Discharge Instructions (Addendum)
Thank you for coming to [facility] Emergency Department.  You have chosen to leave AGAINST MEDICAL ADVICE after you presented for chest pain.  Your workup is incomplete at the time that you would like to be discharged.  You have recognized and understood and that you convey significant morbidity and mortality up to and including death from a heart attack as a result of leaving here today.  You will be leaving AGAINST MEDICAL ADVICE.  Please return to the emergency department.  If you do not return to the emergency department please follow-up with your cardiologist as an outpatient as soon as possible.

## 2023-04-16 NOTE — ED Notes (Signed)
Per [facility] husband states that patient has "some cognitive impairment"

## 2023-04-22 ENCOUNTER — Encounter: Payer: Self-pay | Admitting: *Deleted

## 2023-04-22 ENCOUNTER — Telehealth: Payer: Self-pay | Admitting: *Deleted

## 2023-04-22 NOTE — Transitions of Care (Post Inpatient/ED Visit) (Signed)
   04/22/2023  Name: Marissa [name] MRN: [id number] DOB: [birth year]  Today's TOC FU Call Status: Today's TOC FU Call Status:: Unsuccessul Call (1st Attempt) Unsuccessful Call (1st Attempt) Date: 04/22/23  ED EMMI Red Alert notification on 04/21/23 from ED visit 04/16/23- EMMI call placed 04/18/23: "No scheduled follow up" and "No discharge instructions"  Attempted to reach the patient regarding the most recent ED visit; left HIPAA compliant voice message requesting call back  Follow Up Plan: Additional outreach attempts will be made to reach the patient to complete the Transitions of Care (Post ED visit) call.   Caryl Pina, RN, BSN, CCRN Alumnus RN CM Care Coordination/ Transition of Care- [facility] Care Management [phone]: direct office

## 2023-04-23 ENCOUNTER — Encounter: Payer: Self-pay | Admitting: *Deleted

## 2023-04-23 ENCOUNTER — Telehealth: Payer: Self-pay | Admitting: *Deleted

## 2023-04-23 NOTE — Transitions of Care (Post Inpatient/ED Visit) (Signed)
04/23/2023  Name: Marissa [name] MRN: [id number] DOB: [birth year]  Today's TOC FU Call Status: Today's TOC FU Call Status:: Successful TOC FU Call Competed TOC FU Call Complete Date: 04/23/23  ED EMMI Red Alert notification on 04/21/23 from ED visit 04/16/23- EMMI call placed 04/18/23: "No scheduled follow up" and "No discharge instructions"   Spouse returned my call from earlier this afternoon  Transition Care Management Follow-up Telephone Call Date of Discharge: 04/16/23 Discharge Facility: [facility]) Type of Discharge: Emergency Department Reason for ED Visit: Other: (chest pain) Cardiac Conditions Diagnosis: Chest Pain Persisting How have you been since you were released from the hospital?: Better (per husband Ed: "She is better, doing fine.... I have to provide all of her care, she is unable to do anything without my supervision, it's been this way for years.  No more problems with chest pain after the ER visit") Any questions or concerns?: No  Items Reviewed: Did you receive and understand the discharge instructions provided?: No (patient left AMA- minimal AVS information; briefly reviewed with caregiver/ spouse) Medications obtained,verified, and reconciled?: No Medications Not Reviewed Reasons:: Other: (husband declines- reprots no medication concerns; confirms he continues to manage all aspects of medication management for patient; states she is calling for him and he can not review at this time) Any new allergies since your discharge?: No Dietary orders reviewed?: No Do you have support at home?: Yes People in Home: spouse Name of Support/Comfort Primary Source: spouse/ caregiver reports he assists with all self-care activities/ manages all aspects of health care for patient; he declines completing full TOC call, states, "it has been this way for so long, it is just the way it is;" he again declines need for assistance today-- patient can be heard in background yelling  loudly at spouse to come help her now  Medications Reviewed Today: Medications Reviewed Today     Reviewed by Michaela Corner, RN (Registered Nurse) on 04/23/23 at 1524  Med List Status: <None>   Medication Order Taking? Sig Documenting Provider Last Dose Status Informant  ALPRAZolam (XANAX) 0.5 MG tablet [id number] No Take 1 tablet (0.5 mg total) by mouth 2 (two) times daily as needed for anxiety. Myrlene Broker, MD Taking Active Spouse/Significant Other, Pharmacy Records  cloNIDine (CATAPRES) 0.2 MG tablet [id number] No Take 0.1 mg by mouth 2 (two) times daily as needed (systolic over 160). if blood pressure is great than 160 systolic for more than an hour  Patient taking differently: Take 0.1 mg by mouth 2 (two) times daily as needed (sBP > 160 for over 1 hour).   Marykay Lex, MD Taking Active Spouse/Significant Other, Pharmacy Records  Eszopiclone 3 MG TABS [id number] No Take 1 tablet (3 mg total) by mouth at bedtime. Myrlene Broker, MD Taking Active Spouse/Significant Other, Pharmacy Records  FLUoxetine [facility]) 20 MG tablet [id number] No Take 2 tablets (40 mg total) by mouth daily. Myrlene Broker, MD Taking Active Spouse/Significant Other, Pharmacy Records           Med Note Michaela Corner   Fri Feb 14, 2023 10:26 AM) 4//19/24: reports taking 20 mg po QD- has not yet increased- husband reports she did not tolerate the increased dose  HYDROcodone-acetaminophen (NORCO/VICODIN) 5-325 MG tablet [id number] No Take 1 tablet by mouth daily as needed for moderate pain. Myrlene Broker, MD Taking Active   latanoprost (XALATAN) 0.005 % ophthalmic solution [id number] No Place 1 drop into both eyes at bedtime. [provider] Taking Active Spouse/Significant Other, Pharmacy Records  levothyroxine (SYNTHROID) 125 MCG tablet [id number]  TAKE 1 TABLET(125 MCG) BY MOUTH DAILY Myrlene Broker, MD  Active   losartan (COZAAR) 100 MG tablet [id number] No Take 1 tablet  (100 mg total) by mouth daily.  Patient taking differently: Take 100 mg by mouth at bedtime.   Marykay Lex, MD Taking Active Spouse/Significant Other, Pharmacy Records  magnesium oxide (MAG-OX) 400 MG tablet [id number] No Take 1 tablet (400 mg total) by mouth 2 (two) times daily.  Patient taking differently: Take 400 mg by mouth daily.   Myrlene Broker, MD Taking Active Spouse/Significant Other  montelukast (SINGULAIR) 10 MG tablet [id number] No Take 1 tablet (10 mg total) by mouth at bedtime. Myrlene Broker, MD Taking Active   Multiple Vitamin (MULTIVITAMIN WITH MINERALS) TABS tablet [id number] No Take 1 tablet by mouth daily. Elease Etienne, MD Taking Active Spouse/Significant Other  naproxen sodium (ALEVE) 220 MG tablet [id number] No Take 440 mg by mouth daily as needed (pain). [provider] Taking Active Spouse/Significant Other  nebivolol (BYSTOLIC) 10 MG tablet [id number] No TAKE 1 TABLET(10 MG) BY MOUTH DAILY AT 8 PM  Patient taking differently: Take 10 mg by mouth at bedtime.   Marykay Lex, MD Taking Active Spouse/Significant Other, Pharmacy Records           Med Note (COFFELL, Marzella Schlein   Tue Feb 11, 2023  5:35 PM) Per pt's husband, he leaves pt's bedtime medications out for pt but she does not have a regular bedtime so he is unable to provide an exact time of last dose.  Respiratory Therapy Supplies (FLUTTER) DEVI [id number] No Use as directed Nyoka Cowden, MD Taking Active Spouse/Significant Other, Pharmacy Records  triamcinolone cream (KENALOG) 0.1 % [id number] No Apply 1 Application topically 2 (two) times daily.  Patient taking differently: Apply 1 Application topically 2 (two) times daily as needed (irritation).   Myrlene Broker, MD Taking Active Spouse/Significant Other, Pharmacy Records            Home Care and Equipment/Supplies: Were Home Health Services Ordered?: NA Any new equipment or medical supplies ordered?: NA  Functional  Questionnaire: Do you need assistance with bathing/showering or dressing?: Yes (Ed spouse/ caregiver assists with all care needs) Do you need assistance with meal preparation?: Yes (Ed spouse/ caregiver assists with all care needs) Do you need assistance with eating?: No (Ed spouse/ caregiver assists with all care needs) Do you have difficulty maintaining continence: Yes (Ed spouse/ caregiver assists with all care needs) Do you need assistance with getting out of bed/getting out of a chair/moving?: Yes (Ed spouse/ caregiver assists with all care needs) Do you have difficulty managing or taking your medications?: Yes (Ed spouse/ caregiver manages all aspects of medication administration)  Follow up appointments reviewed: PCP Follow-up appointment confirmed?: No (spouse reprots patient is better after ED visit on 04/16/23, and he declines need to schedule post- ED follow up visit with PCP; states he will call if patient needs arise) MD Provider Line [phone] Given: No (verified well-established with current PCP) Specialist Hospital Follow-up appointment confirmed?: Yes Date of Specialist follow-up appointment?: 05/06/23 Follow-Up Specialty Provider:: [facility] surgical provider Do you need transportation to your follow-up appointment?: No Do you understand care options if your condition(s) worsen?: Yes-patient verbalized understanding  SDOH Interventions Today    Flowsheet Row Most Recent Value  SDOH Interventions   Food Insecurity Interventions Intervention Not Indicated  Transportation Interventions  Intervention Not Indicated  [spouse provides all transportation]      TOC Interventions Today    Flowsheet Row Most Recent Value  TOC Interventions   TOC Interventions Discussed/Reviewed TOC Interventions Discussed      Interventions Today    Flowsheet Row Most Recent Value  Chronic Disease   Chronic disease during today's visit Other  [chest pain]  General Interventions    General Interventions Discussed/Reviewed Doctor Visits, General Interventions Discussed  Doctor Visits Discussed/Reviewed PCP, Doctor Visits Discussed  [spouse declines scheduling post-ED visit with PCP,  confirms he has number and will call if/ as needed to schedule at a later time]  Pharmacy Interventions   Pharmacy Dicussed/Reviewed Pharmacy Topics Discussed      Caryl Pina, RN, BSN, CCRN Alumnus RN CM Care Coordination/ Transition of Care- [facility] Care Management [phone]: direct office

## 2023-04-23 NOTE — Transitions of Care (Post Inpatient/ED Visit) (Signed)
   04/23/2023  Name: [name] MRN: [id number] DOB: 05/07/1946  Today's TOC FU Call Status: Today's TOC FU Call Status:: Unsuccessful Call (2nd Attempt) Unsuccessful Call (2nd Attempt) Date: 04/23/23  ED EMMI Red Alert notification on 04/21/23 from ED visit 04/16/23- EMMI call placed 04/18/23: "No scheduled follow up" and "No discharge instructions"   Attempted to reach the patient regarding the most recent ED visit; left HIPAA compliant voice message requesting call back  Follow Up Plan: Additional outreach attempts will be made to reach the patient to complete the Transitions of Care (Post ED visit) call.   Caryl Pina, RN, BSN, CCRN Alumnus RN CM Care Coordination/ Transition of Care- [facility] Care Management [phone]: direct office

## 2023-04-24 DIAGNOSIS — L308 Other specified dermatitis: Secondary | ICD-10-CM | POA: Diagnosis not present

## 2023-04-24 DIAGNOSIS — D485 Neoplasm of uncertain behavior of skin: Secondary | ICD-10-CM | POA: Diagnosis not present

## 2023-04-29 ENCOUNTER — Ambulatory Visit
Admission: RE | Admit: 2023-04-29 | Discharge: 2023-04-29 | Disposition: A | Discharge status: Home / Self Care | Payer: Medicare Other | Source: Ambulatory Visit | Attending: Thoracic Surgery (Cardiothoracic Vascular Surgery) | Admitting: Thoracic Surgery (Cardiothoracic Vascular Surgery)

## 2023-04-29 DIAGNOSIS — R911 Solitary pulmonary nodule: Secondary | ICD-10-CM

## 2023-04-29 DIAGNOSIS — I7121 Aneurysm of the ascending aorta, without rupture: Secondary | ICD-10-CM | POA: Diagnosis not present

## 2023-05-06 ENCOUNTER — Encounter: Payer: Self-pay | Admitting: Thoracic Surgery (Cardiothoracic Vascular Surgery)

## 2023-05-06 ENCOUNTER — Ambulatory Visit: Payer: Medicare Other | Admitting: Thoracic Surgery (Cardiothoracic Vascular Surgery)

## 2023-05-06 VITALS — BP 175/88 | HR 57 | Resp 20 | Ht 64.0 in | Wt 124.0 lb

## 2023-05-06 DIAGNOSIS — R911 Solitary pulmonary nodule: Secondary | ICD-10-CM

## 2023-05-06 DIAGNOSIS — I7121 Aneurysm of the ascending aorta, without rupture: Secondary | ICD-10-CM | POA: Diagnosis not present

## 2023-05-06 NOTE — Progress Notes (Signed)
[address]       [place] [address]             [phone]     HPI: Mrs. Lagle returns for follow-up of a right upper lobe opacity.  Marissa [name] is a 77 year old woman with a history of hypertension, hyperlipidemia, mitral prolapse, bicuspid aortic valve, mild aortic stenosis, ascending aneurysm, atrial fibrillation, V-fib arrest, strokes, multiple falls, asthma, arthritis, ulcerative colitis, C. difficile, chronic pain, and dementia.  She been followed for an ascending aneurysm since 2012.  In November 2022 she had a CT which showed the aneurysm was stable, but there was a new central groundglass opacity in the right upper lobe.  There were also some calcified hilar lymph nodes.  Follow-up in November 2023 showed an increase in size.  Navigational bronchoscopy was scheduled but canceled due to C. difficile.  She finally did have navigational bronchoscopy in March 2024.  Biopsies and cytology showed benign bronchial epithelial cells with reactive changes and a lot of macrophages.  No tumor was seen.  She had some issues afterwards with confusion and agitation.  Because of other health issues we continue to follow this nodule radiographically.  In the interim since her last visit she was seen in the ED in June with chest pain.  She became agitated and left AMA.  Over the weekend she fell and suffered a laceration above her eye and a black eye.  No LOC.  Past Medical History:  Diagnosis Date   Anxiety    Arthritis    Asthma    Bicuspid aortic valve 05/2017   Likely functional bicuspid aortic valve with sclerosis and no stenosis.   Bursitis    Cardiac arrest (HCC)    x 2 (within an hour) June 2023   Cataract    mild   Cognitive change    Dyspnea    occasional   Dysrhythmia    A-fib   Dysthymia    GERD (gastroesophageal reflux disease)    Hemorrhoids    Hx of ulcerative colitis    per dr Lovell Sheehan as per pt.   Hyperlipidemia    Hypothyroidism    Labile  hypertension    managed - labile.   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 Dorris Fetch)   Thyroid disease     Current Outpatient Medications  Medication Sig Dispense Refill   ALPRAZolam (XANAX) 0.5 MG tablet Take 1 tablet (0.5 mg total) by mouth 2 (two) times daily as needed for anxiety. 60 tablet 3   cloNIDine (CATAPRES) 0.2 MG tablet Take 0.1 mg by mouth 2 (two) times daily as needed (systolic over 160). if blood pressure is great than 160 systolic for more than an hour (Patient taking differently: Take 0.1 mg by mouth 2 (two) times daily as needed (sBP > 160 for over 1 hour).) 30 tablet 6   Eszopiclone 3 MG TABS Take 1 tablet (3 mg total) by mouth at bedtime. 90 tablet 1   FLUoxetine (PROZAC) 20 MG tablet Take 2 tablets (40 mg total) by mouth daily. 180 tablet 1  HYDROcodone-acetaminophen (NORCO/VICODIN) 5-325 MG tablet Take 1 tablet by mouth daily as needed for moderate pain. 20 tablet 0   latanoprost (XALATAN) 0.005 % ophthalmic solution Place 1 drop into both eyes at bedtime.     levothyroxine (SYNTHROID) 125 MCG tablet TAKE 1 TABLET(125 MCG) BY MOUTH DAILY 90 tablet 1   losartan (COZAAR) 100 MG tablet Take 1 tablet (100 mg total) by mouth daily. (Patient taking differently: Take 100 mg by mouth at bedtime.) 90 tablet 3   magnesium oxide (MAG-OX) 400 MG tablet Take 1 tablet (400 mg total) by mouth 2 (two) times daily. (Patient taking differently: Take 400 mg by mouth daily.) 60 tablet 0   montelukast (SINGULAIR) 10 MG tablet Take 1 tablet (10 mg total) by mouth at bedtime. 90 tablet 3   Multiple Vitamin (MULTIVITAMIN WITH MINERALS) TABS tablet Take 1 tablet by mouth daily.     naproxen sodium  (ALEVE) 220 MG tablet Take 440 mg by mouth daily as needed (pain).     nebivolol (BYSTOLIC) 10 MG tablet TAKE 1 ZOXWRU(04 MG) BY MOUTH DAILY AT 8 PM (Patient taking differently: Take 10 mg by mouth at bedtime.) 90 tablet 3   Respiratory Therapy Supplies (FLUTTER) DEVI Use as directed 1 each 0   triamcinolone cream (KENALOG) 0.1 % Apply 1 Application topically 2 (two) times daily. (Patient taking differently: Apply 1 Application topically 2 (two) times daily as needed (irritation).) 100 g 0   No current facility-administered medications for this visit.    Physical Exam BP (!) 175/88   Pulse (!) 57   Resp 20   Ht 5\' 4"  (1.626 m)   Wt 124 lb (56.2 kg)   SpO2 99% Comment: RA  BMI 21.68 kg/m  77 year old woman in no acute distress Interactive but off topic Laceration supraorbital on the left with left orbital ecchymosis Cardiac regular rate and rhythm Lungs clear bilaterally  Diagnostic Tests: CT CHEST WITHOUT CONTRAST   TECHNIQUE: Multidetector CT imaging of the chest was performed following the standard protocol without IV contrast.   RADIATION DOSE REDUCTION: This exam was performed according to the departmental dose-optimization program which includes automated exposure control, adjustment of the mA and/or kV according to patient size and/or use of iterative reconstruction technique.   COMPARISON:  12/30/2022   FINDINGS: Cardiovascular: The heart size is normal. No substantial pericardial effusion. Coronary artery calcification is evident. Mild atherosclerotic calcification is noted in the wall of the thoracic aorta. Ascending thoracic aorta measures up to 4.3 cm diameter.   Mediastinum/Nodes: No mediastinal lymphadenopathy. Calcified nodal tissue is seen in the mediastinum and right hilum. No evidence for gross hilar lymphadenopathy although assessment is limited by the lack of intravenous contrast on the current study. The esophagus has normal imaging features. There  is no axillary lymphadenopathy.   Lungs/Pleura: Right suprahilar irregular ground-glass opacity measures approximately 3.8 x 2.7 cm on image 53/8 which compares to 4.1 x 2.8 cm when re- measuring at the same level on the prior study. No new suspicious pulmonary nodule or mass. No lobar consolidation. There is no evidence of pleural effusion.   Upper Abdomen: Visualized portion of the upper abdomen is unremarkable.   Musculoskeletal: No worrisome lytic or sclerotic osseous abnormality. Old sternal fracture again noted.   IMPRESSION: 1. No substantial change in the right suprahilar irregular ground-glass opacity. 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 2. 4.3 cm diameter ascending thoracic aorta. Recommend annual imaging followup by CTA or MRA. This recommendation  follows 2010 ACCF/AHA/AATS/ACR/ASA/SCA/SCAI/SIR/STS/SVM Guidelines for the Diagnosis and Management of Patients with Thoracic Aortic Disease. Circulation. 2010; 121: Z610-R604. Aortic aneurysm NOS (ICD10-I71.9) 3.  Aortic Atherosclerosis (ICD10-I70.0).     Electronically Signed   By: Kennith Center M.D.   On: 04/29/2023 15:28 I personally reviewed the CT images.  No significant change to the groundglass opacity in the right upper lobe.  Calcified hilar and mediastinal nodes.  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  Impression: Deeann Katona is a 77 year old woman with a history of hypertension, hyperlipidemia, mitral prolapse, bicuspid aortic valve, mild aortic stenosis, ascending aneurysm, atrial fibrillation, V-fib arrest, strokes, multiple falls, asthma, arthritis, ulcerative colitis, C. difficile, chronic pain, and dementia.  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  However remained stable.  Again discussed options of repeat biopsy versus continued radiographic follow-up.  Given her other issues,  her husband feels it would be best to continue with radiographic follow-up.  Frequent falls-  Dementia-conversant and on topic initially but then ranges off topic and hard to reorient  Plan: Return in 4 months with CT chest  Loreli Slot, MD [facility] [phone]

## 2023-05-07 ENCOUNTER — Encounter: Payer: Self-pay | Admitting: Internal Medicine

## 2023-05-07 MED ORDER — HYDROCODONE-ACETAMINOPHEN 5-325 MG PO TABS: 1.0000 | ORAL_TABLET | Freq: Every day | ORAL | 0 refills | Status: DC | PRN

## 2023-05-07 NOTE — Telephone Encounter (Signed)
PDMP reviewed, last fill for 20 tabs was on 02/28/23.

## 2023-05-13 ENCOUNTER — Telehealth: Payer: Self-pay | Admitting: Physician Assistant

## 2023-05-13 ENCOUNTER — Encounter: Payer: Self-pay | Admitting: Neurology

## 2023-05-13 MED ORDER — DIVALPROEX SODIUM ER 250 MG PO TB24: ORAL_TABLET | ORAL | 6 refills | Status: DC

## 2023-05-13 NOTE — Telephone Encounter (Signed)
Spoke to husband. She has had mood issues but since starting Keppra, she aggressive, mood swings, constant yelling. Discussed switching to Depakote ER 250mg  at bedtime x 1 week, then increase to 500mg  at bedtime. Once she increases Depakote, reduce Keppra to 1/2 tab in AM, 1 tab in PM for 5 days, then reduce to 1 tablet in PM for 5 days, then stop. Side effects and risks of breakthrough seizure with medication adjustment discussed, call for any changes.

## 2023-05-13 NOTE — Telephone Encounter (Signed)
Pts spouse is calling in stating that the pt is having a lot of the side effects from the medication Keppra and would like to see if she can be taking off of it.  Spouse is really having a hard time with the pt being very aggressive, mood swings and other things. While on the phone pt was very ugly to the spouse.  Spouse would like to see if he could get a call back to let him know what he should do.

## 2023-06-06 ENCOUNTER — Other Ambulatory Visit: Payer: Self-pay | Admitting: Internal Medicine

## 2023-06-06 ENCOUNTER — Encounter: Payer: Self-pay | Admitting: Internal Medicine

## 2023-06-06 MED ORDER — ESZOPICLONE 3 MG PO TABS: 3.0000 mg | ORAL_TABLET | Freq: Every day | ORAL | 1 refills | Status: DC

## 2023-06-13 MED ORDER — OXCARBAZEPINE 300 MG PO TABS: ORAL_TABLET | ORAL | 6 refills | Status: DC

## 2023-06-24 ENCOUNTER — Telehealth: Payer: Self-pay | Admitting: Physician Assistant

## 2023-06-24 NOTE — Telephone Encounter (Signed)
Patients daughter called, the patient has changed drastically, husband has not informed any doctors of the patient health  please call allison back [phone]

## 2023-06-24 NOTE — Telephone Encounter (Signed)
She is not recognizing her own home, thinks husband is a professional person. He has been taken care of the wife for 2 years, is looking at new facility with her  with her cognitive issues. Has been runing down the road and locking the door. Fyi.

## 2023-06-24 NOTE — Telephone Encounter (Signed)
She is goig to call [facility] and call back if she needs anything.

## 2023-06-25 ENCOUNTER — Telehealth: Payer: Self-pay | Admitting: Physician Assistant

## 2023-06-25 NOTE — Telephone Encounter (Signed)
Marissa [name] from the referral department at [facility] hosp[facility] would like a clal back concerning patient [phone]

## 2023-06-25 NOTE — Telephone Encounter (Signed)
They are going to reach out to PCP

## 2023-06-27 NOTE — Telephone Encounter (Signed)
Aggie Cosier at [facility] hospice called needing a call back concerning patient [phone]

## 2023-07-01 NOTE — Telephone Encounter (Signed)
Left message, will have to get referral from PCP.

## 2023-07-02 DIAGNOSIS — K080 Exfoliation of teeth due to systemic causes: Secondary | ICD-10-CM | POA: Diagnosis not present

## 2023-07-07 DIAGNOSIS — K080 Exfoliation of teeth due to systemic causes: Secondary | ICD-10-CM | POA: Diagnosis not present

## 2023-07-18 ENCOUNTER — Ambulatory Visit: Payer: Medicare Other | Admitting: Internal Medicine

## 2023-07-18 ENCOUNTER — Encounter: Payer: Self-pay | Admitting: Internal Medicine

## 2023-07-18 VITALS — BP 146/94 | HR 103 | Temp 99.1°F | Ht 64.0 in | Wt 126.8 lb

## 2023-07-18 DIAGNOSIS — E785 Hyperlipidemia, unspecified: Secondary | ICD-10-CM | POA: Diagnosis not present

## 2023-07-18 DIAGNOSIS — F03918 Unspecified dementia, unspecified severity, with other behavioral disturbance: Secondary | ICD-10-CM | POA: Diagnosis not present

## 2023-07-18 DIAGNOSIS — R739 Hyperglycemia, unspecified: Secondary | ICD-10-CM

## 2023-07-18 DIAGNOSIS — Z23 Encounter for immunization: Secondary | ICD-10-CM

## 2023-07-18 DIAGNOSIS — E559 Vitamin D deficiency, unspecified: Secondary | ICD-10-CM

## 2023-07-18 DIAGNOSIS — R0989 Other specified symptoms and signs involving the circulatory and respiratory systems: Secondary | ICD-10-CM

## 2023-07-18 DIAGNOSIS — E538 Deficiency of other specified B group vitamins: Secondary | ICD-10-CM | POA: Diagnosis not present

## 2023-07-18 DIAGNOSIS — R300 Dysuria: Secondary | ICD-10-CM | POA: Diagnosis not present

## 2023-07-18 DIAGNOSIS — E038 Other specified hypothyroidism: Secondary | ICD-10-CM

## 2023-07-18 LAB — CBC WITH DIFFERENTIAL/PLATELET
Basophils Absolute: 0.1 10*3/uL (ref 0.0–0.1)
Basophils Relative: 1.1 % (ref 0.0–3.0)
Eosinophils Absolute: 0.1 10*3/uL (ref 0.0–0.7)
Eosinophils Relative: 2.5 % (ref 0.0–5.0)
HCT: 31.5 % — ABNORMAL LOW (ref 36.0–46.0)
Hemoglobin: 10.4 g/dL — ABNORMAL LOW (ref 12.0–15.0)
Lymphocytes Relative: 15.2 % (ref 12.0–46.0)
Lymphs Abs: 0.8 10*3/uL (ref 0.7–4.0)
MCHC: 32.9 g/dL (ref 30.0–36.0)
MCV: 98.5 fl (ref 78.0–100.0)
Monocytes Absolute: 0.4 10*3/uL (ref 0.1–1.0)
Monocytes Relative: 7.9 % (ref 3.0–12.0)
Neutro Abs: 4 10*3/uL (ref 1.4–7.7)
Neutrophils Relative %: 73.3 % (ref 43.0–77.0)
Platelets: 217 10*3/uL (ref 150.0–400.0)
RBC: 3.2 Mil/uL — ABNORMAL LOW (ref 3.87–5.11)
RDW: 15.5 % (ref 11.5–15.5)
WBC: 5.4 10*3/uL (ref 4.0–10.5)

## 2023-07-18 LAB — BASIC METABOLIC PANEL
BUN: 23 mg/dL (ref 6–23)
CO2: 17 mEq/L — ABNORMAL LOW (ref 19–32)
Calcium: 9.3 mg/dL (ref 8.4–10.5)
Chloride: 97 mEq/L (ref 96–112)
Creatinine, Ser: 0.94 mg/dL (ref 0.40–1.20)
GFR: 58.08 mL/min — ABNORMAL LOW (ref >60.00)
Glucose, Bld: 61 mg/dL — ABNORMAL LOW (ref 70–99)
Potassium: 4.1 mEq/L (ref 3.5–5.1)
Sodium: 133 mEq/L — ABNORMAL LOW (ref 135–145)

## 2023-07-18 LAB — HEPATIC FUNCTION PANEL
ALT: 54 U/L — ABNORMAL HIGH (ref 0–35)
AST: 52 U/L — ABNORMAL HIGH (ref 0–37)
Albumin: 4.2 g/dL (ref 3.5–5.2)
Alkaline Phosphatase: 69 U/L (ref 39–117)
Bilirubin, Direct: 0.2 mg/dL (ref 0.0–0.3)
Total Bilirubin: 0.8 mg/dL (ref 0.2–1.2)
Total Protein: 6.4 g/dL (ref 6.0–8.3)

## 2023-07-18 LAB — URINALYSIS, ROUTINE W REFLEX MICROSCOPIC
Bilirubin Urine: NEGATIVE
Hgb urine dipstick: NEGATIVE
Ketones, ur: 15 — AB
Leukocytes,Ua: NEGATIVE
Nitrite: NEGATIVE
RBC / HPF: NONE SEEN (ref 0–?)
Specific Gravity, Urine: 1.015 (ref 1.000–1.030)
Total Protein, Urine: NEGATIVE
Urine Glucose: NEGATIVE
Urobilinogen, UA: 0.2 (ref 0.0–1.0)
WBC, UA: NONE SEEN (ref 0–?)
pH: 6 (ref 5.0–8.0)

## 2023-07-18 LAB — LIPID PANEL
Cholesterol: 178 mg/dL (ref 0–200)
HDL: 90.7 mg/dL (ref >39.00)
LDL Cholesterol: 65 mg/dL (ref 0–99)
NonHDL: 87.62
Total CHOL/HDL Ratio: 2
Triglycerides: 112 mg/dL (ref 0.0–149.0)
VLDL: 22.4 mg/dL (ref 0.0–40.0)

## 2023-07-18 LAB — HEMOGLOBIN A1C: Hgb A1c MFr Bld: 5.1 % (ref 4.6–6.5)

## 2023-07-18 LAB — VITAMIN B12: Vitamin B-12: 727 pg/mL (ref 211–911)

## 2023-07-18 LAB — VITAMIN D 25 HYDROXY (VIT D DEFICIENCY, FRACTURES): VITD: 62.01 ng/mL (ref 30.00–100.00)

## 2023-07-18 LAB — TSH: TSH: 1.86 u[IU]/mL (ref 0.35–5.50)

## 2023-07-18 MED ORDER — BREXPIPRAZOLE 2 MG PO TABS: 2.0000 mg | ORAL_TABLET | Freq: Every day | ORAL | 3 refills | Status: DC

## 2023-07-18 MED ORDER — BREXPIPRAZOLE 1 MG PO TABS: 1.0000 mg | ORAL_TABLET | Freq: Every day | ORAL | 0 refills | Status: DC

## 2023-07-18 NOTE — Progress Notes (Unsigned)
Patient ID: Marissa [name], female   DOB: 17-Jan-1946, 77 y.o.   MRN: [id number]        Chief Complaint: follow up dysuria, dementia with agression , labile htn, self harm       HPI:  Marissa [name] is a 77 y.o. female here family with main issue adding up to much worsening dementia with verbal and physical agressiveness in the past 2 months, family near it wits end, confirmed by daughter also present by phone today.  BP has been labile and followed per cardiology with prn catapress.  Seems to have clear instructions on the rx, but family seems confused about using the DBP vs the SBP for taking med.  New Engineer, site and son present today as well.  Son is asking if possible UTI could be present as he heard behavior changes can result from this.  Pt also has been mildly injurious to herself with picking sores at her arms         Wt Readings from Last 3 Encounters:  07/18/23 126 lb 12.8 oz (57.5 kg)  05/06/23 124 lb (56.2 kg)  04/16/23 124 lb (56.2 kg)   BP Readings from Last 3 Encounters:  07/18/23 (!) 146/94  05/06/23 (!) 175/88  04/16/23 (!) 154/105         Past Medical History:  Diagnosis Date   Anxiety    Arthritis    Asthma    Bicuspid aortic valve 05/2017   Likely functional bicuspid aortic valve with sclerosis and no stenosis.   Bursitis    Cardiac arrest (HCC)    x 2 (within an hour) June 2023   Cataract    mild   Cognitive change    Dyspnea    occasional   Dysrhythmia    A-fib   Dysthymia    GERD (gastroesophageal reflux disease)    Hemorrhoids    Hx of ulcerative colitis    per dr Lovell Sheehan as per pt.   Hyperlipidemia    Hypothyroidism    Labile hypertension    managed - labile.   Migraine    Pneumonia    several times   Scoliosis    Slurred speech    temporal lobe area that is not a tumor causes occ slurred speech and inability to communicate/ words will not come out at the correct time   Spinal stenosis    Stroke (HCC) 08/06/2021   Thoracic aortic  aneurysm (HCC)    Stable 4.2-4.3 cm (followed by Dr. Dorris Fetch)   Thyroid disease    Past Surgical History:  Procedure Laterality Date   ABDOMINAL HYSTERECTOMY     BREAST EXCISIONAL BIOPSY Left    BUNIONECTOMY     Cardiac Event Monitor  07/2017   Overall relatively normal.  Normal sinus rhythm with rare bradycardia and tachycardia.  Heart rate ranged from 55-110 bpm.  Occasional PACs and PVCs, every single 1 was felt.  No arrhythmias other than one short 4 beat run of PACs.   COLONOSCOPY  02/04/2005   all normal    FOOT SURGERY     3 pins in toes    HAND SURGERY     left thumb joint resection   LOOP RECORDER INSERTION N/A 08/09/2021   Procedure: LOOP RECORDER INSERTION;  Surgeon: Lanier Prude, MD;  Location: [facility] IN[facility]AS[facility];  Service: Cardiovascular;  Laterality: N/A;   nasal revision     NM MYOVIEW LTD  07/2017   LOW RISK study.  No ischemia or infarction.  EF greater than 65%.    SINUS IRRIGATION     TONSILLECTOMY AND ADENOIDECTOMY     TRANSTHORACIC ECHOCARDIOGRAM  06/'1, 8/'19   a) Moderate LVH. Normal EF 60-65%. Normal diastolic parameters. --> Difficult to fully visualize the aortic valve. Cannot exclude bicuspid valve. Mild aortic stenosis noted. No PFO. Mildly dilated left atrium. Trivial MR. No comment on mitral valve prolapse. Moderately dilated ascending aorta.;; b) F/u Echo To evaluate the aortic valve. -- Bicuspid AoV - mildly thickened / calcified. - No stenosis   TRANSTHORACIC ECHOCARDIOGRAM  08/08/2021   EF 60 to 65%.  Normal diastolic parameters?.   ?  Functional bicuspid aortic valve with R&L cusp sommisure.  Very sclerotic.  Stable gradients ~mean gradient 10 mmHg. Very Mild stenosis. Aorta dilated to 42 mm.  Stable.  RAP 3 mmHg   TRANSTHORACIC ECHOCARDIOGRAM  05/16/2022   EF 60 to 65%.  No edema.  Mild LVH.  Moderately Story function.  Mild by atrial enlargement.  Normal RAP for functionally bicuspid aortic valve.Aorta 43 mm.   VIDEO BRONCHOSCOPY WITH  ENDOBRONCHIAL NAVIGATION N/A 01/17/2023   Procedure: VIDEO BRONCHOSCOPY WITH ENDOBRONCHIAL NAVIGATION;  Surgeon: Loreli Slot, MD;  Location: [facility] OR;  Service: Thoracic;  Laterality: N/A;    reports that she has never smoked. She has never been exposed to tobacco smoke. She has never used smokeless tobacco. She reports that she does not currently use alcohol after a past usage of about 7.0 standard drinks of alcohol per week. She reports that she does not use drugs. family history includes Arthritis in her mother; Breast cancer in her maternal grandmother and paternal grandmother; Diabetes in her paternal aunt; Heart disease in her maternal grandfather and maternal uncle; Leukemia in her father. Allergies  Allergen Reactions   Crestor [Rosuvastatin] Anaphylaxis, Hives, Shortness Of Breath, Itching, Photosensitivity, Nausea And Vomiting, Anxiety, Palpitations and Other (See Comments)   Duragesic-100 [Fentanyl] Shortness Of Breath and Rash   Pacerone [Amiodarone] Nausea Only and Other (See Comments)    Ataxia  Weakness  Chills Dyspnea  Near syncope Chest pain   Tikosyn [Dofetilide] Other (See Comments)    Torsade and cardiac arrest during dofetilide initiation   Apresoline [Hydralazine] Other (See Comments)    Weakness  Sweats Skin redness   Hctz [Hydrochlorothiazide] Other (See Comments)    Hyponatremia   Oxycontin [Oxycodone] Other (See Comments)    Unknown reaction   Amoxil [Amoxicillin] Diarrhea, Swelling and Rash    Rash to vaginal area, including swelling    Codeine Other (See Comments)    Hyperactivity    Conjugated Estrogens Itching and Rash   Erythromycin Rash    Reaction to E-mycin Has done ok with other medications in this class   Feldene [Piroxicam] Itching and Rash    Reaction to brand name Feldene   Current Outpatient Medications on File Prior to Visit  Medication Sig Dispense Refill   ALPRAZolam (XANAX) 0.5 MG tablet Take 1 tablet (0.5 mg total) by mouth 2  (two) times daily as needed for anxiety. 60 tablet 3   cloNIDine (CATAPRES) 0.2 MG tablet Take 0.1 mg by mouth 2 (two) times daily as needed (systolic over 160). if blood pressure is great than 160 systolic for more than an hour (Patient taking differently: Take 0.1 mg by mouth 2 (two) times daily as needed (sBP > 160 for over 1 hour).) 30 tablet 6   Eszopiclone 3 MG TABS Take 1 tablet (3 mg total) by mouth  at bedtime. 90 tablet 1   FLUoxetine (PROZAC) 20 MG tablet Take 2 tablets (40 mg total) by mouth daily. 180 tablet 1   HYDROcodone-acetaminophen (NORCO/VICODIN) 5-325 MG tablet Take 1 tablet by mouth daily as needed for moderate pain. 20 tablet 0   latanoprost (XALATAN) 0.005 % ophthalmic solution Place 1 drop into both eyes at bedtime.     levothyroxine (SYNTHROID) 125 MCG tablet TAKE 1 TABLET(125 MCG) BY MOUTH DAILY 90 tablet 1   losartan (COZAAR) 100 MG tablet Take 1 tablet (100 mg total) by mouth daily. (Patient taking differently: Take 100 mg by mouth at bedtime.) 90 tablet 3   magnesium oxide (MAG-OX) 400 MG tablet Take 1 tablet (400 mg total) by mouth 2 (two) times daily. (Patient taking differently: Take 400 mg by mouth daily.) 60 tablet 0   montelukast (SINGULAIR) 10 MG tablet Take 1 tablet (10 mg total) by mouth at bedtime. 90 tablet 3   Multiple Vitamin (MULTIVITAMIN WITH MINERALS) TABS tablet Take 1 tablet by mouth daily.     naproxen sodium (ALEVE) 220 MG tablet Take 440 mg by mouth daily as needed (pain).     nebivolol (BYSTOLIC) 10 MG tablet TAKE 1 ZOXWRU(04 MG) BY MOUTH DAILY AT 8 PM (Patient taking differently: Take 10 mg by mouth at bedtime.) 90 tablet 3   Oxcarbazepine (TRILEPTAL) 300 MG tablet Take 1 tablet twice a day 60 tablet 6   Respiratory Therapy Supplies (FLUTTER) DEVI Use as directed 1 each 0   triamcinolone cream (KENALOG) 0.1 % Apply 1 Application topically 2 (two) times daily. (Patient taking differently: Apply 1 Application topically 2 (two) times daily as needed  (irritation).) 100 g 0   No current facility-administered medications on file prior to visit.        ROS:  All others reviewed and negative.  Objective        PE:  BP (!) 146/94 (BP Location: Left Arm, Patient Position: Sitting, Cuff Size: Normal)   Pulse (!) 103   Temp 99.1 F (37.3 C) (Oral)   Ht 5\' 4"  (1.626 m)   Wt 126 lb 12.8 oz (57.5 kg)   SpO2 99%   BMI 21.77 kg/m                 Constitutional: Pt appears in NAD               HENT: Head: NCAT.                Right Ear: External ear normal.                 Left Ear: External ear normal.                Eyes: . Pupils are equal, round, and reactive to light. Conjunctivae and EOM are normal               Nose: without d/c or deformity               Neck: Neck supple. Gross normal ROM               Cardiovascular: Normal rate and regular rhythm.                 Pulmonary/Chest: Effort normal and breath sounds without rales or wheezing.                Abd:  Soft, NT, ND, + BS, no organomegaly  Neurological: Pt is alert. At baseline orientation, motor grossly intact               Skin: Skin is warm. No rashes, no other new lesions, LE edema - none               Psychiatric: Pt behavior is normal without agitation   Micro: none  Cardiac tracings I have personally interpreted today:  none  Pertinent Radiological findings (summarize): none   Lab Results  Component Value Date   WBC 5.4 07/18/2023   HGB 10.4 (L) 07/18/2023   HCT 31.5 (L) 07/18/2023   PLT 217.0 07/18/2023   GLUCOSE 61 (L) 07/18/2023   CHOL 178 07/18/2023   TRIG 112.0 07/18/2023   HDL 90.70 07/18/2023   LDLDIRECT 146.0 11/21/2020   LDLCALC 65 07/18/2023   ALT 54 (H) 07/18/2023   AST 52 (H) 07/18/2023   NA 133 (L) 07/18/2023   K 4.1 07/18/2023   CL 97 07/18/2023   CREATININE 0.94 07/18/2023   BUN 23 07/18/2023   CO2 17 (L) 07/18/2023   TSH 1.86 07/18/2023   INR 1.0 01/17/2023   HGBA1C 5.1 07/18/2023   Assessment/Plan:  Marissa Tenny  [name] is a 77 y.o. White or Caucasian [1] female with  has a past medical history of Anxiety, Arthritis, Asthma, Bicuspid aortic valve (05/2017), Bursitis, Cardiac arrest (HCC), Cataract, Cognitive change, Dyspnea, Dysrhythmia, Dysthymia, GERD (gastroesophageal reflux disease), Hemorrhoids, ulcerative colitis, Hyperlipidemia, Hypothyroidism, Labile hypertension, Migraine, Pneumonia, Scoliosis, Slurred speech, Spinal stenosis, Stroke (HCC) (08/06/2021), Thoracic aortic aneurysm (HCC), and Thyroid disease.  B12 deficiency Lab Results  Component Value Date   VITAMINB12 727 07/18/2023   Stable, cont oral replacement - b12 1000 mcg qd   Hypothyroidism Lab Results  Component Value Date   TSH 1.86 07/18/2023   Stable, pt to continue levothyroxine 125 mcg qd   Labile hypertension Ok for takig catapres as rx with use of SBP at the 160 trigger for taking med  Vitamin D deficiency Last vitamin D Lab Results  Component Value Date   VD25OH 62.01 07/18/2023   Stable, cont oral replacement   Dysuria For ua and cx if pt will cooperate  Dementia with behavioral disturbance (HCC) Overall behavior worsening with pt injurious to herself and has been physical verbal with family, private sitter now in place , ok for add rexulti 1 mg every day for 4 days, then 2 mg daily, f/u PCP in 2-3 wks. Followup: Return in about 2 weeks (around 08/01/2023).  Oliver Barre, MD 07/19/2023 8:33 PM [facility]

## 2023-07-18 NOTE — Patient Instructions (Addendum)
Please take all new medication as prescribed - the rexulti 1 mg per day for 4 days, then 2 mg per day after that  Please continue all other medications as before, and refills have been done if requested.  Please have the pharmacy call with any other refills you may need.  Please keep your appointments with your specialists as you may have planned  You will be contacted regarding the referral for: psychiatry  Please go to the LAB at the blood drawing area for the tests to be done  You will be contacted by phone if any changes need to be made immediately.  Otherwise, you will receive a letter about your results with an explanation, but please check with MyChart first.  Please see Dr Okey Dupre in 2 wks

## 2023-07-19 DIAGNOSIS — F03918 Unspecified dementia, unspecified severity, with other behavioral disturbance: Secondary | ICD-10-CM | POA: Insufficient documentation

## 2023-07-19 DIAGNOSIS — R300 Dysuria: Secondary | ICD-10-CM | POA: Insufficient documentation

## 2023-07-19 LAB — URINE CULTURE: Result:: NO GROWTH

## 2023-07-19 NOTE — Assessment & Plan Note (Signed)
Lab Results  Component Value Date   TSH 1.86 07/18/2023   Stable, pt to continue levothyroxine 125 mcg qd

## 2023-07-19 NOTE — Assessment & Plan Note (Signed)
For ua and cx if pt will cooperate

## 2023-07-19 NOTE — Assessment & Plan Note (Signed)
Last vitamin D Lab Results  Component Value Date   VD25OH 62.01 07/18/2023   Stable, cont oral replacement

## 2023-07-19 NOTE — Assessment & Plan Note (Signed)
Ok for [employer] as rx with use of SBP at the 160 trigger for taking med

## 2023-07-19 NOTE — Assessment & Plan Note (Signed)
Overall behavior worsening with pt injurious to herself and has been physical verbal with family, private sitter now in place , ok for add rexulti 1 mg every day for 4 days, then 2 mg daily, f/u PCP in 2-3 wks.

## 2023-07-19 NOTE — Assessment & Plan Note (Signed)
Lab Results  Component Value Date   VITAMINB12 727 07/18/2023   Stable, cont oral replacement - b12 1000 mcg qd

## 2023-07-23 ENCOUNTER — Encounter: Payer: Self-pay | Admitting: Internal Medicine

## 2023-07-23 ENCOUNTER — Telehealth: Payer: Self-pay

## 2023-07-24 ENCOUNTER — Other Ambulatory Visit (HOSPITAL_COMMUNITY): Payer: Self-pay

## 2023-07-24 ENCOUNTER — Other Ambulatory Visit: Payer: Self-pay | Admitting: Internal Medicine

## 2023-07-24 DIAGNOSIS — F03918 Unspecified dementia, unspecified severity, with other behavioral disturbance: Secondary | ICD-10-CM

## 2023-07-25 ENCOUNTER — Other Ambulatory Visit (HOSPITAL_COMMUNITY): Payer: Self-pay

## 2023-07-25 ENCOUNTER — Telehealth: Payer: Self-pay

## 2023-07-25 NOTE — Telephone Encounter (Signed)
Pharmacy Patient Advocate Encounter   Received notification from Pt Calls Messages that prior authorization for Rexulti 2mg  tabs is required/requested.   Insurance verification completed.   The patient is insured through [facility] .   Per test claim: PA required; PA started via CoverMyMeds. KEY [id number] . Waiting for clinical questions to populate.

## 2023-07-25 NOTE — Telephone Encounter (Signed)
Clinical questions answered. PA submitted

## 2023-07-28 ENCOUNTER — Other Ambulatory Visit (HOSPITAL_COMMUNITY): Payer: Self-pay

## 2023-07-28 NOTE — Telephone Encounter (Signed)
Pharmacy Patient Advocate Encounter  Received notification from [facility] that Prior Authorization for Rexulti 2mg  has been APPROVED from 07/25/23 to 07/24/24. Ran test claim, Copay is $460.47. This test claim was processed through [facility]- copay amounts may vary at other pharmacies due to pharmacy/plan contracts, or as the patient moves through the different stages of their insurance plan.

## 2023-08-05 ENCOUNTER — Encounter: Payer: Self-pay | Admitting: Internal Medicine

## 2023-08-05 ENCOUNTER — Ambulatory Visit: Payer: Medicare Other | Admitting: Internal Medicine

## 2023-08-05 VITALS — BP 124/80 | HR 125 | Temp 98.3°F | Ht 64.0 in

## 2023-08-05 DIAGNOSIS — F03918 Unspecified dementia, unspecified severity, with other behavioral disturbance: Secondary | ICD-10-CM | POA: Diagnosis not present

## 2023-08-05 DIAGNOSIS — R300 Dysuria: Secondary | ICD-10-CM | POA: Diagnosis not present

## 2023-08-05 LAB — URINALYSIS, ROUTINE W REFLEX MICROSCOPIC
Hgb urine dipstick: NEGATIVE
Nitrite: NEGATIVE
RBC / HPF: NONE SEEN (ref 0–?)
Specific Gravity, Urine: 1.015 (ref 1.000–1.030)
Urine Glucose: NEGATIVE
Urobilinogen, UA: 0.2 (ref 0.0–1.0)
pH: 6 (ref 5.0–8.0)

## 2023-08-05 MED ORDER — HYDROXYZINE PAMOATE 25 MG PO CAPS: 25.0000 mg | ORAL_CAPSULE | Freq: Three times a day (TID) | ORAL | 5 refills | Status: DC | PRN

## 2023-08-05 NOTE — Patient Instructions (Signed)
I would recommend to try the hydroxyzine for itching and aggression that can help with relaxation.  I would recommend trying the hydroxyzine twice a day and up to 3 times a day as needed.

## 2023-08-05 NOTE — Progress Notes (Unsigned)
Subjective:   Patient ID: Marissa [name], female    DOB: 1946/10/25, 77 y.o.   MRN: [id number]  HPI The patient is a 77 YO female coming in for follow up new start of medication for aggressive behavior in the setting of memory decline from alzheimer's and vascular etiology. They were unable to afford medication and did not start. She is struggling with taking medications at times due to feeling like her husband is overmedicating. She feels well today.   Review of Systems  Unable to perform ROS: Dementia  Constitutional: Negative.   HENT: Negative.    Eyes: Negative.   Respiratory:  Negative for cough, chest tightness and shortness of breath.   Cardiovascular:  Negative for chest pain, palpitations and leg swelling.  Gastrointestinal:  Negative for abdominal distention, abdominal pain, constipation, diarrhea, nausea and vomiting.  Musculoskeletal: Negative.   Skin: Negative.   Neurological: Negative.   Psychiatric/Behavioral: Negative.      Objective:  Physical Exam Constitutional:      Appearance: She is well-developed.  HENT:     Head: Normocephalic and atraumatic.  Cardiovascular:     Rate and Rhythm: Normal rate and regular rhythm.  Pulmonary:     Effort: Pulmonary effort is normal. No respiratory distress.     Breath sounds: Normal breath sounds. No wheezing or rales.  Abdominal:     General: Bowel sounds are normal. There is no distension.     Palpations: Abdomen is soft.     Tenderness: There is no abdominal tenderness. There is no rebound.  Musculoskeletal:     Cervical back: Normal range of motion.  Skin:    General: Skin is warm and dry.  Neurological:     Mental Status: She is alert.     Coordination: Coordination normal.     Vitals:   08/05/23 1311  BP: 124/80  Pulse: (!) 125  Temp: 98.3 F (36.8 C)  TempSrc: Oral  SpO2: 97%  Height: 5\' 4"  (1.626 m)    Assessment & Plan:  Visit time 25 minutes in face to face communication with patient and  coordination of care, additional 10 minutes spent in record review, coordination or care, ordering tests, communicating/referring to other healthcare professionals, documenting in medical records all on the same day of the visit for total time 35 minutes spent on the visit.

## 2023-08-06 LAB — URINE CULTURE: Result:: NO GROWTH

## 2023-08-07 ENCOUNTER — Other Ambulatory Visit: Payer: Self-pay | Admitting: Thoracic Surgery (Cardiothoracic Vascular Surgery)

## 2023-08-07 DIAGNOSIS — R911 Solitary pulmonary nodule: Secondary | ICD-10-CM

## 2023-08-07 NOTE — Assessment & Plan Note (Signed)
Suspect mix of vascular (from cardiac arrest) and degenerative process as she has had decline in the last few months. She is becoming aggressive with family and not wanting to take her medications. With itching and scratching at self. Rx hydroxyzine to take BID (up to TID 3rd dose prn) for agitation and itching. Use xanax 0.5 mg prn for anxiety/agitation if she can be persuaded to take. Husband is struggling with care and they have aide to help.

## 2023-08-12 ENCOUNTER — Encounter: Payer: Self-pay | Admitting: Thoracic Surgery (Cardiothoracic Vascular Surgery)

## 2023-08-13 ENCOUNTER — Ambulatory Visit: Payer: Medicare Other

## 2023-08-13 VITALS — Ht 64.0 in | Wt 122.0 lb

## 2023-08-13 DIAGNOSIS — Z0000 Encounter for general adult medical examination without abnormal findings: Secondary | ICD-10-CM

## 2023-08-13 NOTE — Progress Notes (Cosign Needed Addendum)
Subjective:   Marissa [name] is a 77 y.o. female who presents for Medicare Annual (Subsequent) preventive examination.  Visit Complete: Virtual I connected with  Marissa [name] on 08/13/23 by a audio enabled telemedicine application and verified that I am speaking with the correct person using two identifiers.  Patient Location: Home  Provider Location: Office/Clinic  I discussed the limitations of evaluation and management by telemedicine. The patient expressed understanding and agreed to proceed.  Vital Signs: Because this visit was a virtual/telehealth visit, some criteria may be missing or patient reported. Any vitals not documented were not able to be obtained and vitals that have been documented are patient reported.  Televisit was completed by spouse, Dorma Russell.  Cardiac Risk Factors include: advanced age (>9men, >90 women);sedentary lifestyle;dyslipidemia;hypertension;family history of premature cardiovascular disease;Other (see comment) (due to dementia and unsteady gait patient can not exercise.)     Objective:    Today's Vitals   08/13/23 1332 08/13/23 1333  Weight: 122 lb (55.3 kg)   Height: 5\' 4"  (1.626 m)   PainSc: 6  6   PainLoc: Generalized    Body mass index is 20.94 kg/m.     08/13/2023    1:35 PM 04/16/2023    2:08 PM 03/10/2023    2:26 PM 02/11/2023    1:08 PM 01/17/2023    8:43 AM 07/02/2022    3:02 PM 05/21/2022    9:54 AM  Advanced Directives  Does Patient Have a Medical Advance Directive? Yes No Yes No Yes Yes Yes  Type of Estate agent of [place];Living will  Healthcare Power of [place];Living will  Healthcare Power of [employer] of [place];Living will;Out of facility DNR (pink MOST or yellow form) Living will  Does patient want to make changes to medical advance directive?       No - Patient declined  Copy of Healthcare Power of Attorney in Chart? No - copy requested        Would patient like information on  creating a medical advance directive?  No - Patient declined     No - Patient declined    Current Medications (verified) Outpatient Encounter Medications as of 08/13/2023  Medication Sig   ALPRAZolam (XANAX) 0.5 MG tablet Take 1 tablet (0.5 mg total) by mouth 2 (two) times daily as needed for anxiety.   cloNIDine (CATAPRES) 0.2 MG tablet Take 0.1 mg by mouth 2 (two) times daily as needed (systolic over 160). if blood pressure is great than 160 systolic for more than an hour (Patient taking differently: Take 0.1 mg by mouth 2 (two) times daily as needed (sBP > 160 for over 1 hour).)   Eszopiclone 3 MG TABS Take 1 tablet (3 mg total) by mouth at bedtime.   FLUoxetine (PROZAC) 20 MG tablet Take 2 tablets (40 mg total) by mouth daily.   HYDROcodone-acetaminophen (NORCO/VICODIN) 5-325 MG tablet Take 1 tablet by mouth daily as needed for moderate pain.   hydrOXYzine (VISTARIL) 25 MG capsule Take 1 capsule (25 mg total) by mouth every 8 (eight) hours as needed.   latanoprost (XALATAN) 0.005 % ophthalmic solution Place 1 drop into both eyes at bedtime.   levothyroxine (SYNTHROID) 125 MCG tablet TAKE 1 TABLET(125 MCG) BY MOUTH DAILY   losartan (COZAAR) 100 MG tablet Take 1 tablet (100 mg total) by mouth daily. (Patient taking differently: Take 100 mg by mouth at bedtime.)   magnesium oxide (MAG-OX) 400 MG tablet Take 1 tablet (400 mg total)  by mouth 2 (two) times daily. (Patient taking differently: Take 400 mg by mouth daily.)   montelukast (SINGULAIR) 10 MG tablet Take 1 tablet (10 mg total) by mouth at bedtime.   Multiple Vitamin (MULTIVITAMIN WITH MINERALS) TABS tablet Take 1 tablet by mouth daily.   naproxen sodium (ALEVE) 220 MG tablet Take 440 mg by mouth daily as needed (pain).   nebivolol (BYSTOLIC) 10 MG tablet TAKE 1 ZOXWRU(04 MG) BY MOUTH DAILY AT 8 PM (Patient taking differently: Take 10 mg by mouth at bedtime.)   Oxcarbazepine (TRILEPTAL) 300 MG tablet Take 1 tablet twice a day   Respiratory  Therapy Supplies (FLUTTER) DEVI Use as directed   triamcinolone cream (KENALOG) 0.1 % Apply 1 Application topically 2 (two) times daily. (Patient taking differently: Apply 1 Application topically 2 (two) times daily as needed (irritation).)   No facility-administered encounter medications on file as of 08/13/2023.    Allergies (verified) Crestor [rosuvastatin], Duragesic-100 [fentanyl], Pacerone [amiodarone], Tikosyn [dofetilide], Apresoline [hydralazine], Hctz [hydrochlorothiazide], Oxycontin [oxycodone], Amoxil [amoxicillin], Codeine, Conjugated estrogens, Erythromycin, and Feldene [piroxicam]   History: Past Medical History:  Diagnosis Date   Anxiety    Arthritis    Asthma    Bicuspid aortic valve 05/2017   Likely functional bicuspid aortic valve with sclerosis and no stenosis.   Bursitis    Cardiac arrest (HCC)    x 2 (within an hour) June 2023   Cataract    mild   Cognitive change    Dyspnea    occasional   Dysrhythmia    A-fib   Dysthymia    GERD (gastroesophageal reflux disease)    Hemorrhoids    Hx of ulcerative colitis    per dr Lovell Sheehan as per pt.   Hyperlipidemia    Hypothyroidism    Labile hypertension    managed - labile.   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 Dorris Fetch)   Thyroid disease    Past Surgical History:  Procedure Laterality Date   ABDOMINAL HYSTERECTOMY     BREAST EXCISIONAL BIOPSY Left    BUNIONECTOMY     Cardiac Event Monitor  07/2017   Overall relatively normal.  Normal sinus rhythm with rare bradycardia and tachycardia.  Heart rate ranged from 55-110 bpm.  Occasional PACs and PVCs, every single 1 was felt.  No arrhythmias other than one short 4 beat  run of PACs.   COLONOSCOPY  02/04/2005   all normal    FOOT SURGERY     3 pins in toes    HAND SURGERY     left thumb joint resection   LOOP RECORDER INSERTION N/A 08/09/2021   Procedure: LOOP RECORDER INSERTION;  Surgeon: Lanier Prude, MD;  Location: [facility];  Service: Cardiovascular;  Laterality: N/A;   nasal revision     NM MYOVIEW LTD  07/2017   LOW RISK study.  No ischemia or infarction.  EF greater than 65%.    SINUS IRRIGATION     TONSILLECTOMY AND ADENOIDECTOMY     TRANSTHORACIC ECHOCARDIOGRAM  06/'1, 8/'19   a) Moderate LVH. Normal EF 60-65%. Normal diastolic parameters. --> Difficult  to fully visualize the aortic valve. Cannot exclude bicuspid valve. Mild aortic stenosis noted. No PFO. Mildly dilated left atrium. Trivial MR. No comment on mitral valve prolapse. Moderately dilated ascending aorta.;; b) F/u Echo To evaluate the aortic valve. -- Bicuspid AoV - mildly thickened / calcified. - No stenosis   TRANSTHORACIC ECHOCARDIOGRAM  08/08/2021   EF 60 to 65%.  Normal diastolic parameters?.   ?  Functional bicuspid aortic valve with R&L cusp sommisure.  Very sclerotic.  Stable gradients ~mean gradient 10 mmHg. Very Mild stenosis. Aorta dilated to 42 mm.  Stable.  RAP 3 mmHg   TRANSTHORACIC ECHOCARDIOGRAM  05/16/2022   EF 60 to 65%.  No edema.  Mild LVH.  Moderately Story function.  Mild by atrial enlargement.  Normal RAP for functionally bicuspid aortic valve.Aorta 43 mm.   VIDEO BRONCHOSCOPY WITH ENDOBRONCHIAL NAVIGATION N/A 01/17/2023   Procedure: VIDEO BRONCHOSCOPY WITH ENDOBRONCHIAL NAVIGATION;  Surgeon: Loreli Slot, MD;  Location: [facility] OR;  Service: Thoracic;  Laterality: N/A;   Family History  Problem Relation Age of Onset   Arthritis Mother    Leukemia Father    Heart disease Maternal Uncle    Diabetes Paternal Aunt    Breast cancer Maternal Grandmother    Heart disease Maternal Grandfather    Breast cancer Paternal Grandmother    Colon cancer  Neg Hx    Social History   Socioeconomic History   Marital status: Married    Spouse name: Not on file   Number of children: 2   Years of education: Not on file   Highest education level: Bachelor's degree (e.g., BA, AB, BS)  Occupational History   Occupation: retired  Tobacco Use   Smoking status: Never    Passive exposure: Never   Smokeless tobacco: Never   Tobacco comments:    Never smoke 04/23/22  Vaping Use   Vaping status: Never Used  Substance and Sexual Activity   Alcohol use: Not Currently    Alcohol/week: 7.0 standard drinks of alcohol    Types: 7 Shots of liquor per week    Comment: none   Drug use: No   Sexual activity: Not Currently  Other Topics Concern   Not on file  Social History Narrative   Right handed    Lives with husband    Social Determinants of Health   Financial Resource Strain: Low Risk  (08/13/2023)   Overall Financial Resource Strain (CARDIA)    Difficulty of Paying Living Expenses: Not hard at all  Food Insecurity: No Food Insecurity (08/13/2023)   Hunger Vital Sign    Worried About Running Out of Food in the Last Year: Never true    Ran Out of Food in the Last Year: Never true  Transportation Needs: No Transportation Needs (08/13/2023)   PRAPARE - Administrator, Civil Service (Medical): No    Lack of Transportation (Non-Medical): No  Physical Activity: Inactive (08/13/2023)   Exercise Vital Sign    Days of Exercise per Week: 0 days    Minutes of Exercise per Session: 0 min  Stress: No Stress Concern Present (08/13/2023)   [employer] of Occupational Health - Occupational Stress Questionnaire    Feeling of Stress : Not at all  Social Connections: Moderately Integrated (08/13/2023)   Social Connection and Isolation Panel [NHANES]    Frequency of Communication with Friends and Family: Twice a week    Frequency of Social Gatherings with Friends and Family: Once a week  Attends Religious Services: 1 to 4 times per  year    Active Member of Clubs or Organizations: No    Attends Banker Meetings: Never    Marital Status: Married    Tobacco Counseling Counseling given: Not Answered Tobacco comments: Never smoke 04/23/22   Clinical Intake:  Pre-visit preparation completed: Yes  Pain : 0-10 Pain Score: 6  Pain Type: Chronic pain Pain Location: Generalized     BMI - recorded: 20.94 Nutritional Status: BMI of 19-24  Normal Nutritional Risks: None Diabetes: No  How often do you need to have someone help you when you read instructions, pamphlets, or other written materials from your doctor or pharmacy?: 1 - Never What is the last grade level you completed in school?: HSG  Interpreter Needed?: No  Information entered by :: Susie Cassette, LPN.   Activities of Daily Living    08/13/2023    1:36 PM 02/11/2023    3:33 PM  In your present state of health, do you have any difficulty performing the following activities:  Hearing? 0   Vision? 0   Difficulty concentrating or making decisions? 1   Walking or climbing stairs? 1   Dressing or bathing? 1   Doing errands, shopping? 1 1  Preparing Food and eating ? N   Using the Toilet? N   In the past six months, have you accidently leaked urine? Y   Comment Patient wears Depends.   Do you have problems with loss of bowel control? Y   Comment Patient wears Depends.   Managing your Medications? Y   Managing your Finances? Y   Housekeeping or managing your Housekeeping? Y     Patient Care Team: Myrlene Broker, MD as PCP - General (Internal Medicine) Marykay Lex, MD as PCP - Cardiology (Cardiology) Lanier Prude, MD as PCP - Electrophysiology (Cardiology) Loreli Slot, MD as Consulting Physician (Cardiothoracic Surgery) Marykay Lex, MD as Consulting Physician (Cardiology) Nyoka Cowden, MD as Consulting Physician (Pulmonary Disease) Van Clines, MD as Consulting Physician  (Neurology) Elwyn Reach (Neurology)  Indicate any recent Medical Services you may have received from other than [facility] providers in the past year (date may be approximate).     Assessment:   This is a routine wellness examination for Marissa Pickles.  Hearing/Vision screen Hearing Screening - Comments:: Husband stated: Patient denied any hearing difficulty.   No hearing aids.  Vision Screening - Comments:: Husband stated: Patient does wear corrective lenses/contacts.  Annual eye exam done by: [facility]    Goals Addressed             This Visit's Progress    Client's husband understands the importance of follow-up with providers by attending scheduled visits.       Recent Fall       Care Coordination Interventions: Advised caregiver to continue fall prevention strategies         Depression Screen    08/13/2023    1:40 PM 02/28/2023    2:13 PM 11/12/2022   10:12 AM 05/30/2022    8:00 AM 05/14/2022    3:45 PM 02/22/2022    1:33 PM 02/22/2022    1:31 PM  PHQ 2/9 Scores  PHQ - 2 Score 6 0 0 2  0 0  PHQ- 9 Score 12 0 0 3     Exception Documentation     Other- indicate reason in comment box    Not completed  alot of health issues going on      Fall Risk    08/13/2023    1:35 PM 08/05/2023    1:13 PM 03/10/2023    2:26 PM 02/28/2023    2:13 PM 11/12/2022   10:11 AM  Fall Risk   Falls in the past year? 1 0 1 1 1   Number falls in past yr: 1  1 0 0  Injury with Fall? 1 0 1 1 1   Risk for fall due to : History of fall(s);Impaired balance/gait;Orthopedic patient      Follow up Education provided;Falls prevention discussed;Falls evaluation completed Falls evaluation completed Falls evaluation completed Falls evaluation completed Falls evaluation completed    MEDICARE RISK AT HOME: Medicare Risk at Home Any stairs in or around the home?: Yes If so, are there any without handrails?: No Home free of loose throw rugs in walkways, pet beds, electrical cords, etc?:  Yes Adequate lighting in your home to reduce risk of falls?: Yes Life alert?: No Use of a cane, walker or w/c?: Yes Grab bars in the bathroom?: Yes Shower chair or bench in shower?: Yes Elevated toilet seat or a handicapped toilet?: Yes  TIMED UP AND GO:  Was the test performed?  No    Cognitive Function:  Patient has current diagnosis of cognitive impairment. Patient is followed by neurology for ongoing assessment. Patient is unable to complete screening 6CIT or MMSE.      08/13/2023    1:46 PM 07/27/2020    3:07 PM 07/24/2018    3:52 PM 07/14/2017   11:47 AM 06/20/2015   10:15 AM  MMSE - Mini Mental State Exam  Not completed: Unable to complete Unable to complete   Unable to complete  Orientation to time   4 5   Orientation to Place   5 5   Registration   3 3   Attention/ Calculation   5 5   Recall   1 1   Language- name 2 objects   2 2   Language- repeat   1 1   Language- follow 3 step command   3 3   Language- read & follow direction   1 1   Write a sentence   1 1   Copy design   1 1   Total score   27 28       07/02/2022    3:00 PM 11/01/2020   10:00 AM  Montreal Cognitive Assessment   Visuospatial/ Executive (0/5) 1 2  Naming (0/3) 1 3  Attention: Read list of digits (0/2) 2 2  Attention: Read list of letters (0/1) 0 1  Attention: Serial 7 subtraction starting at 100 (0/3) 0 1  Language: Repeat phrase (0/2) 1 1  Language : Fluency (0/1) 0 0  Abstraction (0/2) 0 1  Delayed Recall (0/5) 0 1  Orientation (0/6) 1 5  Total 6 17  Adjusted Score (based on education)  17      Immunizations Immunization History  Administered Date(s) Administered   Fluad Quad(high Dose 65+) 07/14/2020, 08/14/2021   Fluad Trivalent(High Dose 65+) 07/18/2023   Hep A / Hep B 02/24/2018, 06/25/2018   Hepatitis B, ADULT 03/26/2018   Influenza Split 09/12/2011, 08/19/2012   Influenza Whole 08/20/2007, 07/29/2008, 08/07/2009, 07/25/2010   Influenza, High Dose Seasonal PF 07/09/2016,  07/14/2017, 07/24/2018   Influenza,inj,Quad PF,6+ Mos 07/19/2013, 08/16/2014, 07/13/2015   Influenza-Unspecified 07/15/2019   PFIZER(Purple Top)SARS-COV-2 Vaccination 11/17/2019, 12/08/2019   Pneumococcal Conjugate-13 01/06/2015   Pneumococcal  Polysaccharide-23 07/19/2013   Tdap 07/29/2011   Zoster, Live 10/28/2012    TDAP status: Due, Education has been provided regarding the importance of this vaccine. Advised may receive this vaccine at local pharmacy or Health Dept. Aware to provide a copy of the vaccination record if obtained from local pharmacy or Health Dept. Verbalized acceptance and understanding.  Flu Vaccine status: Up to date  Pneumococcal vaccine status: Up to date  Covid-19 vaccine status: Completed vaccines  Qualifies for Shingles Vaccine? Yes   Zostavax completed Yes   Shingrix Completed?: No.    Education has been provided regarding the importance of this vaccine. Patient has been advised to call insurance company to determine out of pocket expense if they have not yet received this vaccine. Advised may also receive vaccine at local pharmacy or Health Dept. Verbalized acceptance and understanding.  Screening Tests Health Maintenance  Topic Date Due   COVID-19 Vaccine (3 - Pfizer risk series) 01/05/2020   DTaP/Tdap/Td (2 - Td or Tdap) 07/28/2021   Medicare Annual Wellness (AWV)  08/12/2024   Pneumonia Vaccine 84+ Years old  Completed   INFLUENZA VACCINE  Completed   DEXA SCAN  Completed   Hepatitis C Screening  Completed   HPV VACCINES  Aged Out   Colonoscopy  Discontinued   Zoster Vaccines- Shingrix  Discontinued    Health Maintenance  Health Maintenance Due  Topic Date Due   COVID-19 Vaccine (3 - Pfizer risk series) 01/05/2020   DTaP/Tdap/Td (2 - Td or Tdap) 07/28/2021    Colorectal cancer screening: No longer required.   Mammogram status: No longer required due to medical condition.  Bone Density status: Completed 02/02/2015. Results reflect: Bone  density results: NORMAL. Repeat every 5-10 years.  Lung Cancer Screening: (Low Dose CT Chest recommended if Age 64-80 years, 20 pack-year currently smoking OR have quit w/in 15years.) does not qualify.   Lung Cancer Screening Referral: no  Additional Screening:  Hepatitis C Screening: does qualify; Completed 02/27/2018  Vision Screening: Recommended annual ophthalmology exams for early detection of glaucoma and other disorders of the eye. Is the patient up to date with their annual eye exam?  Yes  Who is the provider or what is the name of the office in which the patient attends annual eye exams? [facility] If pt is not established with a provider, would they like to be referred to a provider to establish care? No .   Dental Screening: Recommended annual dental exams for proper oral hygiene  Diabetic Foot Exam: N/A  Community Resource Referral / Chronic Care Management: CRR required this visit?  No   CCM required this visit?  No     Plan:     I have personally reviewed and noted the following in the patient's chart:   Medical and social history Use of alcohol, tobacco or illicit drugs  Current medications and supplements including opioid prescriptions. Patient is currently taking opioid prescriptions. Information provided to patient regarding non-opioid alternatives. Patient advised to discuss non-opioid treatment plan with their provider. Functional ability and status Nutritional status Physical activity Advanced directives List of other physicians Hospitalizations, surgeries, and ER visits in previous 12 months Vitals Screenings to include cognitive, depression, and falls Referrals and appointments  In addition, I have reviewed and discussed with patient certain preventive protocols, quality metrics, and best practice recommendations. A written personalized care plan for preventive services as well as general preventive health recommendations were provided to  patient.     Mickeal Needy,  LPN   [id number]   After Visit Summary: (MyChart) Due to this being a telephonic visit, the after visit summary with patients personalized plan was offered to patient via MyChart   Nurse Notes: N/A

## 2023-08-13 NOTE — Patient Instructions (Addendum)
Marissa [name] , Thank you for taking time to come for your Medicare Wellness Visit. I appreciate your ongoing commitment to your health goals. Please review the following plan we discussed and let me know if I can assist you in the future.   Referrals/Orders/Follow-Ups/Clinician Recommendations: No  This is a list of the screening recommended for you and due dates:  Health Maintenance  Topic Date Due   COVID-19 Vaccine (3 - Pfizer risk series) 01/05/2020   DTaP/Tdap/Td vaccine (2 - Td or Tdap) 07/28/2021   Medicare Annual Wellness Visit  08/12/2024   Pneumonia Vaccine  Completed   Flu Shot  Completed   DEXA scan (bone density measurement)  Completed   Hepatitis C Screening  Completed   HPV Vaccine  Aged Out   Colon Cancer Screening  Discontinued   Zoster (Shingles) Vaccine  Discontinued    Advanced directives: (Copy Requested) Please bring a copy of your health care power of attorney and living will to the office to be added to your chart at your convenience.  Next Medicare Annual Wellness Visit scheduled for next year: Yes

## 2023-08-15 ENCOUNTER — Telehealth: Payer: Self-pay | Admitting: Physician Assistant

## 2023-08-15 NOTE — Telephone Encounter (Addendum)
I advised she is wanting to be seen. Rexulti she is going to look in to the [place], assistance form given. Family is scheduling a visit with Marlowe Kays, PA-C. She thanked me for calling. Also if she becomes in danger, information was given to [facility] [phone].

## 2023-08-15 NOTE — Telephone Encounter (Signed)
Pt's daughter called in stating they had to call the police last night. She is self harming. She doesn't recognize her home and believes she is being held Environmental health practitioner. She has gone downhill fast. They need to know what they should do?

## 2023-08-20 ENCOUNTER — Telehealth: Payer: Self-pay | Admitting: Physician Assistant

## 2023-08-20 ENCOUNTER — Ambulatory Visit: Payer: Medicare Other | Admitting: Physician Assistant

## 2023-08-20 ENCOUNTER — Encounter: Payer: Self-pay | Admitting: Physician Assistant

## 2023-08-20 VITALS — BP 125/73 | HR 118 | Resp 18 | Ht 64.0 in | Wt 123.0 lb

## 2023-08-20 DIAGNOSIS — F02818 Dementia in other diseases classified elsewhere, unspecified severity, with other behavioral disturbance: Secondary | ICD-10-CM

## 2023-08-20 DIAGNOSIS — F0280 Dementia in other diseases classified elsewhere without behavioral disturbance: Secondary | ICD-10-CM

## 2023-08-20 DIAGNOSIS — G309 Alzheimer's disease, unspecified: Secondary | ICD-10-CM | POA: Diagnosis not present

## 2023-08-20 DIAGNOSIS — G301 Alzheimer's disease with late onset: Secondary | ICD-10-CM

## 2023-08-20 MED ORDER — BREXPIPRAZOLE 2 MG PO TABS: ORAL_TABLET | ORAL | 5 refills | Status: DC

## 2023-08-20 MED ORDER — DIVALPROEX SODIUM ER 250 MG PO TB24: 250.0000 mg | ORAL_TABLET | Freq: Every day | ORAL | 3 refills | Status: DC

## 2023-08-20 NOTE — Patient Instructions (Addendum)
Follow up with primary  anemia since you are reporting dark stools , may need a GI evaluation  No NSAIDS  Resume Depakote ER 250mg  at bedtime x 1 week, then increase to 500mg  at bedtime.   Follow up in 6 months  Information about the Rexulti given    FALL PRECAUTIONS: Be cautious when walking. Scan the area for obstacles that may increase the risk of trips and falls. When getting up in the mornings, sit up at the edge of the bed for a few minutes before getting out of bed. Consider elevating the bed at the head end to avoid drop of blood pressure when getting up. Walk always in a well-lit room (use night lights in the walls). Avoid area rugs or power cords from appliances in the middle of the walkways. Use a walker or a cane if necessary and consider physical therapy for balance exercise. Get your eyesight checked regularly.  FINANCIAL OVERSIGHT: Supervision, especially oversight when making financial decisions or transactions is also recommended as difficulties arise.  HOME SAFETY: Consider the safety of the kitchen when operating appliances like stoves, microwave oven, and blender. Consider having supervision and share cooking responsibilities until no longer able to participate in those. Accidents with firearms and other hazards in the house should be identified and addressed as well.  DRIVING: Regarding driving, in patients with progressive memory problems, driving will be impaired. We advise to have someone else do the driving if trouble finding directions or if minor accidents are reported. Independent driving assessment is available to determine safety of driving.  ABILITY TO BE LEFT ALONE: If patient is unable to contact 911 operator, consider using LifeLine, or when the need is there, arrange for someone to stay with patients. Smoking is a fire hazard, consider supervision or cessation. Risk of wandering should be assessed by caregiver and if detected at any point, supervision and safe proof  recommendations should be instituted.  MEDICATION SUPERVISION: Inability to self-administer medication needs to be constantly addressed. Implement a mechanism to ensure safe administration of the medications.  RECOMMENDATIONS FOR ALL PATIENTS WITH MEMORY PROBLEMS: 1. Continue to exercise (Recommend 30 minutes of walking everyday, or 3 hours every week) 2. Increase social interactions - continue going to [place] and enjoy social gatherings with friends and family 3. Eat healthy, avoid fried foods and eat more fruits and vegetables 4. Maintain adequate blood pressure, blood sugar, and blood cholesterol level. Reducing the risk of stroke and cardiovascular disease also helps promoting better memory. 5. Avoid stressful situations. Live a simple life and avoid aggravations. Organize your time and prepare for the next day in anticipation. 6. Sleep well, avoid any interruptions of sleep and avoid any distractions in the bedroom that may interfere with adequate sleep quality 7. Avoid sugar, avoid sweets as there is a strong link between excessive sugar intake, diabetes, and cognitive impairment We discussed the Mediterranean diet, which has been shown to help patients reduce the risk of progressive memory disorders and reduces cardiovascular risk. This includes eating fish, eat fruits and green leafy vegetables, nuts like almonds and hazelnuts, walnuts, and also use olive oil. Avoid fast foods and fried foods as much as possible. Avoid sweets and sugar as sugar use has been linked to worsening of memory function.  There is always a concern of gradual progression of memory problems. If this is the case, then we may need to adjust level of care according to patient needs. Support, both to the patient and caregiver, should then  be put into place.

## 2023-08-20 NOTE — Telephone Encounter (Signed)
Pls let them know Rx was sent, thanks
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
## 2023-08-20 NOTE — Telephone Encounter (Signed)
Patients husband and care giver amy called in , Marissa [name] got approved for the medication, Rexulti  . She called the healthcare foundation and they approved the copay.   Pharmacy- [facility] [address] in [place]
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
## 2023-09-01 ENCOUNTER — Ambulatory Visit
Admission: RE | Admit: 2023-09-01 | Discharge: 2023-09-01 | Disposition: A | Discharge status: Home / Self Care | Payer: Medicare Other | Source: Ambulatory Visit | Attending: Thoracic Surgery (Cardiothoracic Vascular Surgery) | Admitting: Thoracic Surgery (Cardiothoracic Vascular Surgery)

## 2023-09-01 DIAGNOSIS — J90 Pleural effusion, not elsewhere classified: Secondary | ICD-10-CM | POA: Diagnosis not present

## 2023-09-01 DIAGNOSIS — I2510 Atherosclerotic heart disease of native coronary artery without angina pectoris: Secondary | ICD-10-CM | POA: Diagnosis not present

## 2023-09-01 DIAGNOSIS — R918 Other nonspecific abnormal finding of lung field: Secondary | ICD-10-CM | POA: Diagnosis not present

## 2023-09-01 DIAGNOSIS — R911 Solitary pulmonary nodule: Secondary | ICD-10-CM

## 2023-09-08 ENCOUNTER — Other Ambulatory Visit: Payer: Self-pay | Admitting: Internal Medicine

## 2023-09-08 ENCOUNTER — Encounter: Payer: Self-pay | Admitting: Internal Medicine

## 2023-09-08 DIAGNOSIS — E039 Hypothyroidism, unspecified: Secondary | ICD-10-CM

## 2023-09-09 ENCOUNTER — Encounter: Payer: Self-pay | Admitting: Thoracic Surgery (Cardiothoracic Vascular Surgery)

## 2023-09-09 ENCOUNTER — Ambulatory Visit: Payer: Medicare Other | Admitting: Thoracic Surgery (Cardiothoracic Vascular Surgery)

## 2023-09-09 VITALS — BP 179/131 | HR 85 | Resp 20 | Ht 64.0 in | Wt 132.2 lb

## 2023-09-09 DIAGNOSIS — R911 Solitary pulmonary nodule: Secondary | ICD-10-CM

## 2023-09-09 NOTE — Progress Notes (Signed)
[address]       [place] [address]             [phone]      HPI: Mrs. Dunstan returns for follow-up of a right upper lobe opacity and ascending aneurysm.  She is accompanied by her husband and her caregiver.  Labelle Tremonti is a 77 year old woman with a history of hypertension, hyperlipidemia, mitral prolapse, bicuspid aortic valve, mild aortic stenosis, ascending aneurysm, atrial fibrillation, V-fib arrest, strokes, multiple falls, arthritis, ulcerative colitis, chronic pain, and dementia.  She has been followed for an ascending aneurysm since 2012.  In November 2022 the CT showed a new central groundglass opacity in the right upper lobe.  There were also calcified hilar lymph nodes.  Over time it got larger.  We did a navigational bronchoscopy in March 2024.  It was negative for malignancy.  She has dementia and has had episodes of agitation.  She is really not a surgical candidate so we have continue to follow her radiographically.    Patient Active Problem List   Diagnosis Date Noted   Dementia with behavioral disturbance (HCC) 07/19/2023   Dysuria 07/19/2023   Seizures (HCC) 02/11/2023   Enteritis, enteropathogenic E. coli 11/12/2022   Congenital nevus 06/27/2022   C. difficile diarrhea 05/21/2022   HTN (hypertension) 05/21/2022   Dementia (HCC) 04/25/2022   Drug-induced torsades de pointes (HCC) 04/24/2022   Cardiac arrest (HCC) 04/24/2022   Ataxia 03/04/2022   Aortic atherosclerosis (HCC) 12/29/2021   Costochondritis 12/26/2021   Persistent atrial fibrillation (HCC): CHA2DS2-VASc Score = 7  08/31/2021   Secondary hypercoagulable state (HCC) 08/31/2021   Solitary pulmonary nodule 08/17/2021   Fall at home, initial encounter 08/06/2021   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HCC) 08/06/2021   Chronic cough 06/01/2021   Memory loss 07/15/2020   Closed fracture of right clavicle 10/25/2019   Depressive disorder 09/06/2019   Overactive bladder 09/06/2019    Severe protein-calorie malnutrition (HCC) 12/22/2018   Hypokalemia 12/22/2018   Hypomagnesemia 12/22/2018   Hypophosphatemia 12/22/2018   Anxiety    Prolonged QT interval 12/21/2018   Hyponatremia 12/21/2018   Dyslipidemia 10/02/2018   Low back pain 12/31/2017   Pain of left hip joint 12/31/2017   Encounter for chronic pain management 11/28/2017   Dyspnea on exertion 06/01/2017   Mild aortic stenosis 05/27/2017   Routine general medical examination at a health care facility 07/09/2016   Frequent falls 03/28/2016   Rosacea 08/18/2015   Lumbar radiculopathy 07/07/2015   Urinary incontinence 06/20/2015   Hemorrhoids 10/06/2014   Cough variant asthma vs UACS 08/03/2014   Labile hypertension 02/22/2014   Insomnia secondary to chronic pain 07/09/2012   B12 deficiency 04/03/2011   Vitamin D deficiency 07/25/2010   ANEMIA, IRON DEFICIENCY 04/25/2009   Allergic rhinitis 02/01/2009   Mild dilation of ascending aorta (HCC) 12/27/2008   Chronic fatigue syndrome 04/21/2008   Osteoarthritis 01/19/2008   Hypothyroidism 03/13/2007   Asthma 03/13/2007   Fibromyalgia 03/13/2007    Current Outpatient Medications  Medication Sig Dispense Refill   ALPRAZolam (XANAX) 0.5 MG tablet Take 1 tablet (0.5 mg total) by mouth 2 (two) times daily as needed for anxiety. 60 tablet 3   brexpiprazole (REXULTI) 2 MG TABS tablet Take 1 tablet every evening 30 tablet 5   cloNIDine (CATAPRES) 0.2 MG tablet Take 0.1 mg by mouth 2 (two) times daily as needed (systolic over 160). if blood pressure is great than 160 systolic for more than an  hour (Patient taking differently: Take 0.1 mg by mouth 2 (two) times daily as needed (sBP > 160 for over 1 hour).) 30 tablet 6   divalproex (DEPAKOTE ER) 250 MG 24 hr tablet Take 1 tablet (250 mg total) by mouth daily. 30 tablet 3   Eszopiclone 3 MG TABS Take 1 tablet (3 mg total) by mouth at bedtime. 90 tablet 1   FLUoxetine (PROZAC) 20 MG tablet Take 2 tablets (40 mg total) by  mouth daily. 180 tablet 1   HYDROcodone-acetaminophen (NORCO/VICODIN) 5-325 MG tablet Take 1 tablet by mouth daily as needed for moderate pain. 20 tablet 0   hydrOXYzine (VISTARIL) 25 MG capsule Take 1 capsule (25 mg total) by mouth every 8 (eight) hours as needed. 90 capsule 5   latanoprost (XALATAN) 0.005 % ophthalmic solution Place 1 drop into both eyes at bedtime.     levothyroxine (SYNTHROID) 125 MCG tablet TAKE 1 TABLET(125 MCG) BY MOUTH DAILY 90 tablet 1   losartan (COZAAR) 100 MG tablet Take 1 tablet (100 mg total) by mouth daily. (Patient taking differently: Take 100 mg by mouth at bedtime.) 90 tablet 3   magnesium oxide (MAG-OX) 400 MG tablet Take 1 tablet (400 mg total) by mouth 2 (two) times daily. (Patient taking differently: Take 400 mg by mouth daily.) 60 tablet 0   montelukast (SINGULAIR) 10 MG tablet Take 1 tablet (10 mg total) by mouth at bedtime. 90 tablet 3   Multiple Vitamin (MULTIVITAMIN WITH MINERALS) TABS tablet Take 1 tablet by mouth daily.     naproxen sodium (ALEVE) 220 MG tablet Take 440 mg by mouth daily as needed (pain).     nebivolol (BYSTOLIC) 10 MG tablet TAKE 1 NFAOZH(08 MG) BY MOUTH DAILY AT 8 PM (Patient taking differently: Take 10 mg by mouth at bedtime.) 90 tablet 3   Oxcarbazepine (TRILEPTAL) 300 MG tablet Take 1 tablet twice a day 60 tablet 6   Respiratory Therapy Supplies (FLUTTER) DEVI Use as directed 1 each 0   triamcinolone cream (KENALOG) 0.1 % Apply 1 Application topically 2 (two) times daily. (Patient taking differently: Apply 1 Application topically 2 (two) times daily as needed (irritation).) 100 g 0   No current facility-administered medications for this visit.    Physical Exam BP (!) 179/131 (BP Location: Left Arm, Patient Position: Sitting, Cuff Size: Normal)   Pulse 85   Resp 20   Ht 5\' 4"  (1.626 m)   Wt 132 lb 3.2 oz (60 kg)   SpO2 99% Comment: RA  BMI 22.65 kg/m  77 year old woman in no acute distress Irregularly irregular with 2/6  murmur. Lungs clear bilaterally Alert, conversational, mild confusion  Diagnostic Tests: CT CHEST WITHOUT CONTRAST   TECHNIQUE: Multidetector CT imaging of the chest was performed following the standard protocol without IV contrast.   RADIATION DOSE REDUCTION: This exam was performed according to the departmental dose-optimization program which includes automated exposure control, adjustment of the mA and/or kV according to patient size and/or use of iterative reconstruction technique.   COMPARISON:  04/29/2023   FINDINGS: Cardiovascular: Aortic atherosclerosis. Unchanged enlargement of the tubular ascending thoracic aorta measuring up to 4.4 x 4.3 cm. Mild cardiomegaly. Left coronary artery calcifications. No pericardial effusion.   Mediastinum/Nodes: No enlarged mediastinal, hilar, or axillary lymph nodes. Small benign calcified pretracheal and right hilar lymph nodes, benign sequelae of prior granulomatous infection, requiring no further follow-up or characterization thyroid gland, trachea, and esophagus demonstrate no significant findings.   Lungs/Pleura: Irregular ground-glass opacity about the  suprahilar bronchovascular structures of the right upper lobe measuring 3.9 x 2.7 cm (series 5, image 57). Trace, likely loculated right pleural effusion, new compared to prior examination.   Upper Abdomen: No acute abnormality. Benign granulomatous calcifications throughout the spleen, for which no specific further follow-up or characterization is required.   Musculoskeletal: No chest wall abnormality. No acute osseous findings.   IMPRESSION: 1. Irregular ground-glass opacity about the suprahilar bronchovascular structures of the right upper lobe measuring 3.9 x 2.7 cm. In general, given peribronchovascular distribution and evidence of granulomatous right hilar lymph nodes, this may reflect chronic sequelae of prior infection or inflammation, however indolent  adenocarcinoma is not excluded in a ground-glass opacity of this size. Continued surveillance warranted. 2. Trace, likely loculated right pleural effusion, new compared to prior examination. 3. Unchanged enlargement of the tubular ascending thoracic aorta measuring up to 4.4 x 4.3 cm. Recommend annual imaging followup by CTA or MRA. This recommendation follows 2010 ACCF/AHA/AATS/ACR/ASA/SCA/SCAI/SIR/STS/SVM Guidelines for the Diagnosis and Management of Patients with Thoracic Aortic Disease. Circulation. 2010; 121: A355-D322. Aortic aneurysm NOS (ICD10-I71.9) 4. Coronary artery disease.   Aortic Atherosclerosis (ICD10-I70.0).     Electronically Signed   By: Jearld Lesch M.D.   On: 09/01/2023 15:07 I personally reviewed the CT images.  No change in the right upper lobe opacity.  Also no change in the 4.3 cm ascending aneurysm.  Coronary calcification.  Impression: Marissa [name] is a 77 year old woman with a history of hypertension, hyperlipidemia, mitral prolapse, bicuspid aortic valve, mild aortic stenosis, ascending aneurysm, atrial fibrillation, V-fib arrest, strokes, multiple falls, arthritis, ulcerative colitis, chronic pain, and dementia.  Right upper lobe opacity-possible scarring from previous infection.  She does have some calcified nodes.  Cannot rule out the possibility of a low-grade adenocarcinoma.  Has been biopsied previously which showed reactive cells and macrophages but no malignancy.  Offered the option of continued radiographic follow-up.  She is not a candidate for surgical resection.  Ascending aneurysm-stable at 4.3 cm.  No indication for surgery.  Not a surgical candidate.  Hypertension-blood pressure is markedly elevated today.  She has been running in the 140-160 range systolic at home.  She has a as needed clonidine for systolic greater than 160.  I recommended that she take that when she gets home.  She has a follow-up with Dr. Herbie Baltimore next  week.  Plan: Follow-up with Dr. Herbie Baltimore as scheduled Return in 6 months with CT chest.  I spent 20 minutes in review of records, images, and in consultation with Mrs. Cortinas today. Loreli Slot, MD [facility] [phone]

## 2023-09-10 ENCOUNTER — Ambulatory Visit: Payer: Medicare Other | Admitting: Physician Assistant

## 2023-09-14 NOTE — Progress Notes (Unsigned)
Cardiology Office Note:  .   Date:  09/16/2023  ID:  Marissa [name], DOB [birth year], MRN [id number] PCP: Marissa Broker, MD  [facility] Providers Cardiologist:  Marissa Lemma, MD Electrophysiologist:  Marissa Prude, MD     Chief Complaint  Patient presents with   Follow-up    6 months.   Edema    Ankles and feet.   Atrial Fibrillation    Does not realize she is in A-fib.    Patient Profile: .     Marissa [name] is a very anxious 77 y.o. female with progressive signs of dementia persistent A-fib (CHA2DS2-VASc score 7 with history of CVA-intolerance to amiodarone and Tikosyn), mild AS and mild thoracic aortic dilation, with labile hypertension, HLD (intolerant to statins, Nexletol, Zetia) and prior history of drug-induced torsades who presents here for 47-month follow-up at the request of Marissa [name], *.    Marissa [name] was last seen on 03/03/2023-as usual, accompanied by her husband who provides most of the story.  We had previously stopped her DOAC because of bruising.  Now she Primus refuses to take them.  She seems pretty confused.  Happy to have the bruising improved.  Perseverated on issues with different hospitalizations.  They were comfortable taking as needed clonidine.  Seems to be tolerating the increased dose of losartan to 100 mg daily taking it nightly with bisoprolol in the morning.  Pretty much in good spirits.  Stable from cardiac standpoint.  Not very active with generalized fatigue.  Limited by back and joint pain as well as myalgias and fatigue.  Bleeding and bruising less prominent off of DOAC.  Notable memory loss and significant anxiety/nervousness and insomnia.  She was recently seen by Dr. Dorris Fetch for following of the thoracic aorta and pulmonary nodule.  Was noted to have markedly elevated blood pressures but averaging pressures in the 140s to 160s at home.  Recommended that she take her clonidine as needed for elevated blood  pressure.  Subjective  Discussed the use of AI scribe software for clinical note transcription with the patient, who gave verbal consent to proceed.  History of Present Illness   The patient, with a history of hypertension, presents with fluctuating blood pressure, often reaching 160 or above, typically in the afternoons. She has been managing these episodes with half a dose of Clonidine, which appears to control the situation. The patient also reports feelings of stress and anxiety, which may be contributing to the blood pressure spikes.  In addition to baseline BP meds: Losartan 100 and Bystolic 10. She takes as needed Clonidine (1/2 tab) for SBP sustained > 160 mmHg.    The patient has a history of atrial fibrillation (AFib), but currently reports no noticeable symptoms. She is not on any blood thinners and does not take aspirin.  The patient has also been experiencing swelling in her left foot and ankle, which has not resolved with elevation or soaking in Epsom salts. She reports no swelling in the upper leg or any other areas. The patient has been experiencing some confusion, which she attributes to stress and anxiety.   She also takes Vistaril (Hydroxyzine) as needed for anxiety, which she reports needing daily.    The patient's caregiver inquired about the use of Lion's Mane, a type of mushroom, for blood pressure management. The patient has not started this treatment.      Cardiovascular ROS: no chest pain or dyspnea on exertion positive for -  palpitations and occasional fleeting unusual symptoms in her chest.; L foot swelling negative for - orthopnea, paroxysmal nocturnal dyspnea, rapid heart rate, shortness of breath, or syncope or near syncope just some occasional dizziness.  ROS:  Review of Systems - Negative except lots of anxiety-she indicated having "stress at home between her husband ".  Likely this is related to her anxiety and dementia and him being caregiver.  He seems  to be somewhat stoic with her but is quite likely that he gets frustrated.;  Noted the left foot swelling and redness but not warm to touch.  Somewhat unsteady gait.  Poor balance.    Objective   Studies Reviewed: [name] Kitchen   EKG Interpretation Date/Time:  Tuesday September 16 2023 09:22:54 EST Ventricular Rate:  87 PR Interval:    QRS Duration:  82 QT Interval:  412 QTC Calculation: 495 R Axis:   -36  Text Interpretation: Atrial fibrillation Left axis deviation Minimal voltage criteria for LVH, may be normal variant ( R in aVL ) Anteroseptal infarct , age undetermined ST & T wave abnormality, consider inferior ischemia When compared with ECG of 16-Apr-2023 14:04, Atrial fibrillation NOW PRESENT T wave inversion Septal leads NOW PRESENT Otherwise no significant change Confirmed by Marissa [name] ([id number]) on 09/16/2023 12:24:21 PM   ECHO 04/23/2022: EF 60 to 65% with no RWMA.  Mild LVH.  Mild biatrial dilation.  Functionally bicuspid aortic valve.  No AS.  Ascending aorta again measured at 43 mm  Labs 07/18/2023: TC 170, TG 112, L HDL 91, LDL 65.  A1c 5.1.  Hgb 10.4,Cr 0.94, K+ 4.1, TSH 1.86.   Risk Assessment/Calculations:    CHA2DS2-VASc Score = 7   This indicates a 11.2% annual risk of stroke. The patient's score is based upon: CHF History: 0 HTN History: 1 Diabetes History: 0 Stroke History: 2 Vascular Disease History: 1 Age Score: 2 Gender Score: 1   Patient no longer on DOAC because of significant bleeding and bruising..  Refuses to take DOAC         Physical Exam:   VS:  BP 124/80 (BP Location: Right Arm, Patient Position: Sitting, Cuff Size: Normal)   Pulse 87   Ht 5\' 4"  (1.626 m)   Wt 127 lb (57.6 kg)   BMI 21.80 kg/m    Wt Readings from Last 3 Encounters:  09/16/23 127 lb (57.6 kg)  09/09/23 132 lb 3.2 oz (60 kg)  08/20/23 123 lb (55.8 kg)    GEN: Well nourished, well developed in no acute distress; relatively healthy diffusely.  A little more confused than usual today.   Some word finding issues. NECK: No JVD; No carotid bruits CARDIAC: Irregularly irregular rhythm-normal rate. Normal S1, S2; harsh 1/6 SEM at RUSB; no rubs, gallops RESPIRATORY:  Clear to auscultation without rales, wheezing or rhonchi ; nonlabored, good air movement. ABDOMEN: Soft, non-tender, non-distended EXTREMITIES: Left foot more swollen than right, no evidence of infection, no erythema or warmth. Edema present in ankles.    ASSESSMENT AND PLAN: .    Problem List Items Addressed This Visit       Cardiology Problems   Drug-induced torsades de pointes (HCC) (Chronic)    No longer on antiarrhythmics.  Only taking beta-blocker.  Now on Depakote.      Relevant Medications   furosemide (LASIX) 20 MG tablet   Other Relevant Orders   EKG 12-Lead (Completed)   Hypercoagulable state due to paroxysmal atrial fibrillation (HCC) (Chronic)    CHA2DS2-VASc score 7  Again,  we discussed the increased stroke risk but against my recommendations she continues to be averse to any DOAC and would not be interested in warfarin.  I have at least convince them to try to restart aspirin 81 mg daily.      Relevant Medications   furosemide (LASIX) 20 MG tablet   Labile hypertension (Chronic)    Still has pretty frequent jumps in her blood pressure.  Usually taking clonidine at least once a day.  Blood pressure seems to be fluctuating with stress and anxiety, reaching up to 160s.   - Currently managed with Losartan 100mg , Bystolic 10mg , and PRN 1/2 of clonidine 0.2 mg tablet: For sustained SBP> 160 mmHg x 1 hour . -Continue current regimen, including PRN Clonidine for stress-induced hypertension. -Encouraged to avoid stressors when possible.      Relevant Medications   furosemide (LASIX) 20 MG tablet   Other Relevant Orders   EKG 12-Lead (Completed)   Mild aortic stenosis (Chronic)    Minimal-mild on echo in June 2023.  Functionally bicuspid.      Relevant Medications   furosemide (LASIX)  20 MG tablet   Other Relevant Orders   EKG 12-Lead (Completed)   Mild dilation of ascending aorta (HCC) (Chronic)    Followed by Dr. Orson Aloe.  Relatively stable.  I doubt that she will get to a state that anything will need to be done.      Relevant Medications   furosemide (LASIX) 20 MG tablet   Other Relevant Orders   EKG 12-Lead (Completed)   Persistent atrial fibrillation (HCC): CHA2DS2-VASc Score = 7  - Primary (Chronic)    Has been intolerant of despite IV medication to treat A-fib including rhythm control and rate control agents. Now only on Bystolic 10 mg daily.  Currently in Afib, but asymptomatic and heart rate well controlled. No anticoagulation therapy: Per patient choice: intolerant of DOAC's and would not take warfarin. -Continue current rate control with p.m. dosing of Bystolic 10 mg -Start baby Aspirin 81mg  for stroke prevention due to intermittent Afib.   (They understand the risks of not being on DOAC and aspirin only provides minimal protection.  It also prevents [place] from being able to consider cardioversion)      Relevant Medications   furosemide (LASIX) 20 MG tablet   Other Relevant Orders   EKG 12-Lead (Completed)   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HCC)    Likely thromboembolic stroke in the past and has A-fib history. Despite counseling and elevated CHA2DS2-VASc risk, she is not interested in taking any version of anticoagulation.  She is willing to really try aspirin 81 mg      Relevant Medications   furosemide (LASIX) 20 MG tablet     Other   Chronic fatigue syndrome (Chronic)    Better with less frequent dosing of clonidine.  I suspect that with her labile pressures she will need at least almost every other day if not every day clonidine.  I am sure there is some issues with sleep given her level of anxiety and dementia.      Relevant Orders   EKG 12-Lead (Completed)   Dyslipidemia (Chronic)    Lipids pretty well-controlled, most recent labs checked.  Not  currently on any medication to control lipids, but she did have a drop from May.      Relevant Orders   EKG 12-Lead (Completed)   Other Visit Diagnoses     Edema, unspecified type  Assessment and Plan      Anxiety Daily anxiety contributing to hypertension. Currently managed with PRN Hydroxyzine. -Continue PRN Hydroxyzine for anxiety.  Atrial Fibrillation   Lower Extremity Edema Noted swelling in left foot and ankle, no signs of infection or blood clot. No relief with elevation or Epsom salt soaks. -Start wearing medium weight support stockings. -Prescribe Lasix 20mg  PRN for persistent swelling, not to exceed twice weekly to avoid dehydration.            Follow-Up: Return in about 6 months (around 03/15/2024).  Total time spent: 27 min spent with patient + 18 min spent charting = 45 min     Signed, Marykay Lex, MD, MS Marissa [name], M.D., M.S. Interventional Cardiologist  [facility]  Pager # [phone] Phone # [phone] [address] [place], [place] [address]

## 2023-09-16 ENCOUNTER — Ambulatory Visit: Payer: Medicare Other | Attending: Cardiology | Admitting: Cardiology

## 2023-09-16 ENCOUNTER — Encounter: Payer: Self-pay | Admitting: Cardiology

## 2023-09-16 VITALS — BP 124/80 | HR 87 | Ht 64.0 in | Wt 127.0 lb

## 2023-09-16 DIAGNOSIS — I77810 Thoracic aortic ectasia: Secondary | ICD-10-CM | POA: Diagnosis not present

## 2023-09-16 DIAGNOSIS — T50905D Adverse effect of unspecified drugs, medicaments and biological substances, subsequent encounter: Secondary | ICD-10-CM

## 2023-09-16 DIAGNOSIS — I4819 Other persistent atrial fibrillation: Secondary | ICD-10-CM | POA: Diagnosis not present

## 2023-09-16 DIAGNOSIS — E785 Hyperlipidemia, unspecified: Secondary | ICD-10-CM

## 2023-09-16 DIAGNOSIS — I350 Nonrheumatic aortic (valve) stenosis: Secondary | ICD-10-CM | POA: Diagnosis not present

## 2023-09-16 DIAGNOSIS — G9332 Myalgic encephalomyelitis/chronic fatigue syndrome: Secondary | ICD-10-CM

## 2023-09-16 DIAGNOSIS — I4721 Torsades de pointes: Secondary | ICD-10-CM

## 2023-09-16 DIAGNOSIS — R0989 Other specified symptoms and signs involving the circulatory and respiratory systems: Secondary | ICD-10-CM

## 2023-09-16 DIAGNOSIS — I639 Cerebral infarction, unspecified: Secondary | ICD-10-CM

## 2023-09-16 DIAGNOSIS — D6869 Other thrombophilia: Secondary | ICD-10-CM

## 2023-09-16 DIAGNOSIS — R609 Edema, unspecified: Secondary | ICD-10-CM

## 2023-09-16 DIAGNOSIS — I480 Paroxysmal atrial fibrillation: Secondary | ICD-10-CM

## 2023-09-16 MED ORDER — FUROSEMIDE 20 MG PO TABS: 20.0000 mg | ORAL_TABLET | Freq: Every day | ORAL | 3 refills | Status: AC | PRN

## 2023-09-16 NOTE — Assessment & Plan Note (Signed)
Minimal-mild on echo in June 2023.  Functionally bicuspid.

## 2023-09-16 NOTE — Assessment & Plan Note (Signed)
No longer on antiarrhythmics.  Only taking beta-blocker.  Now on Depakote.

## 2023-09-16 NOTE — Patient Instructions (Signed)
Medication Instructions:  Continue current medications.   Start taking Furosemide 20 mg daily as needed for feet edema that is not resolved by elevation or using compression hose. Script sent.   *If you need a refill on your cardiac medications before your next appointment, please call your pharmacy*   Follow-Up: At [facility], you and your health needs are our priority.  As part of our continuing mission to provide you with exceptional heart care, we have created designated Provider Care Teams.  These Care Teams include your primary Cardiologist (physician) and Advanced Practice Providers (APPs -  Physician Assistants and Nurse Practitioners) who all work together to provide you with the care you need, when you need it.  Your next appointment:   6 month(s)  Provider:   Bryan Lemma, MD     Other Instructions Hand out on compression hosiery given.

## 2023-09-16 NOTE — Assessment & Plan Note (Signed)
CHA2DS2-VASc score 7  Again, we discussed the increased stroke risk but against my recommendations she continues to be averse to any DOAC and would not be interested in warfarin.  I have at least convince them to try to restart aspirin 81 mg daily.

## 2023-09-16 NOTE — Assessment & Plan Note (Signed)
Lipids pretty well-controlled, most recent labs checked.  Not currently on any medication to control lipids, but she did have a drop from May.

## 2023-09-16 NOTE — Assessment & Plan Note (Signed)
Has been intolerant of despite IV medication to treat A-fib including rhythm control and rate control agents. Now only on Bystolic 10 mg daily.  Currently in Afib, but asymptomatic and heart rate well controlled. No anticoagulation therapy: Per patient choice: intolerant of DOAC's and would not take warfarin. -Continue current rate control with p.m. dosing of Bystolic 10 mg -Start baby Aspirin 81mg  for stroke prevention due to intermittent Afib.   (They understand the risks of not being on DOAC and aspirin only provides minimal protection.  It also prevents [place] from being able to consider cardioversion)

## 2023-09-16 NOTE — Assessment & Plan Note (Signed)
Likely thromboembolic stroke in the past and has A-fib history. Despite counseling and elevated CHA2DS2-VASc risk, she is not interested in taking any version of anticoagulation.  She is willing to really try aspirin 81 mg

## 2023-09-16 NOTE — Assessment & Plan Note (Signed)
Better with less frequent dosing of clonidine.  I suspect that with her labile pressures she will need at least almost every other day if not every day clonidine.  I am sure there is some issues with sleep given her level of anxiety and dementia.

## 2023-09-16 NOTE — Assessment & Plan Note (Signed)
Followed by Dr. Orson Aloe.  Relatively stable.  I doubt that she will get to a state that anything will need to be done.

## 2023-09-16 NOTE — Assessment & Plan Note (Signed)
Still has pretty frequent jumps in her blood pressure.  Usually taking clonidine at least once a day.  Blood pressure seems to be fluctuating with stress and anxiety, reaching up to 160s.   - Currently managed with Losartan 100mg , Bystolic 10mg , and PRN 1/2 of clonidine 0.2 mg tablet: For sustained SBP> 160 mmHg x 1 hour . -Continue current regimen, including PRN Clonidine for stress-induced hypertension. -Encouraged to avoid stressors when possible.

## 2023-09-18 ENCOUNTER — Other Ambulatory Visit: Payer: Self-pay | Admitting: Cardiology

## 2023-09-18 ENCOUNTER — Encounter: Payer: Self-pay | Admitting: Cardiology

## 2023-09-18 MED ORDER — NEBIVOLOL HCL 10 MG PO TABS: ORAL_TABLET | ORAL | 3 refills | Status: DC

## 2023-09-22 ENCOUNTER — Ambulatory Visit: Payer: Medicare Other | Admitting: Internal Medicine

## 2023-09-22 VITALS — BP 138/62 | HR 107 | Temp 98.7°F | Ht 64.0 in | Wt 124.0 lb

## 2023-09-22 DIAGNOSIS — J069 Acute upper respiratory infection, unspecified: Secondary | ICD-10-CM

## 2023-09-22 MED ORDER — HYDROCODONE BIT-HOMATROP MBR 5-1.5 MG/5ML PO SOLN: 5.0000 mL | Freq: Three times a day (TID) | ORAL | 0 refills | Status: DC | PRN

## 2023-09-22 MED ORDER — PREDNISONE 20 MG PO TABS: 40.0000 mg | ORAL_TABLET | Freq: Every day | ORAL | 0 refills | Status: DC

## 2023-09-22 MED ORDER — ALBUTEROL SULFATE HFA 108 (90 BASE) MCG/ACT IN AERS: 2.0000 | INHALATION_SPRAY | Freq: Four times a day (QID) | RESPIRATORY_TRACT | 0 refills | Status: DC | PRN

## 2023-09-22 NOTE — Progress Notes (Unsigned)
   Subjective:   Patient ID: Marissa [name], female    DOB: [birth year], 77 y.o.   MRN: [id number]  HPI The patient is a 77 YO female coming in for cough with husband and caretaker and they help to provide history. She is coughing a lot with chills and body aches. Going on a week or so.   Review of Systems  Constitutional:  Positive for activity change, appetite change and chills.  HENT:  Positive for congestion and postnasal drip.   Eyes: Negative.   Respiratory:  Positive for cough and shortness of breath. Negative for chest tightness.   Cardiovascular:  Negative for chest pain, palpitations and leg swelling.  Gastrointestinal:  Negative for abdominal distention, abdominal pain, constipation, diarrhea, nausea and vomiting.  Musculoskeletal: Negative.   Skin: Negative.   Neurological: Negative.   Psychiatric/Behavioral: Negative.      Objective:  Physical Exam Constitutional:      Appearance: She is well-developed.  HENT:     Head: Normocephalic and atraumatic.     Comments: Oropharynx with redness and clear drainage, nose with swollen turbinates, TMs normal bilaterally.  Neck:     Thyroid: No thyromegaly.  Cardiovascular:     Rate and Rhythm: Normal rate and regular rhythm.  Pulmonary:     Effort: Pulmonary effort is normal. No respiratory distress.     Breath sounds: Wheezing present. No rales.  Abdominal:     Palpations: Abdomen is soft.  Musculoskeletal:        General: Tenderness present.     Cervical back: Normal range of motion.  Lymphadenopathy:     Cervical: No cervical adenopathy.  Skin:    General: Skin is warm and dry.  Neurological:     Mental Status: She is alert and oriented to person, place, and time.     Vitals:   09/22/23 1444  BP: 138/62  Pulse: (!) 107  Temp: 98.7 F (37.1 C)  TempSrc: Oral  SpO2: 97%  Weight: 124 lb (56.2 kg)  Height: 5\' 4"  (1.626 m)    Assessment & Plan:

## 2023-09-22 NOTE — Patient Instructions (Signed)
We have sent in an inhaler to use for breathing if needed.   We have sent in cough medicine to help and can be used up to 3 times a day as needed.  We have sent in the prednisone to take 2 pills daily for 5 days.

## 2023-09-24 ENCOUNTER — Encounter: Payer: Self-pay | Admitting: Internal Medicine

## 2023-09-24 DIAGNOSIS — J069 Acute upper respiratory infection, unspecified: Secondary | ICD-10-CM | POA: Insufficient documentation

## 2023-09-24 NOTE — Assessment & Plan Note (Signed)
Suspected viral URI outside window for covid-19 treatment and flu treatment so testing not done. Rx prednisone for some mild wheeze on exam. Rx albuterol due to SOB during coughing and rx hycodan cough medicine (cannot do promethazine due to long QT). She will let us know if not improving in 2 days.

## 2023-10-23 ENCOUNTER — Other Ambulatory Visit: Payer: Self-pay | Admitting: Internal Medicine

## 2023-12-08 ENCOUNTER — Ambulatory Visit: Payer: Medicare Other | Admitting: Podiatry

## 2023-12-08 ENCOUNTER — Encounter: Payer: Self-pay | Admitting: Podiatry

## 2023-12-08 DIAGNOSIS — M2041 Other hammer toe(s) (acquired), right foot: Secondary | ICD-10-CM | POA: Diagnosis not present

## 2023-12-08 DIAGNOSIS — L84 Corns and callosities: Secondary | ICD-10-CM | POA: Diagnosis not present

## 2023-12-08 NOTE — Progress Notes (Signed)
  Subjective:  Patient ID: Marissa [name], female    DOB: 11/03/1945,   MRN: [id number]  No chief complaint on file.   78 y.o. female presents for concern of lesion on the inside of her second toe. Relates it has been present for a while and has been very painful. She is hear with caretaker who relates they have tried some callus removers and not improving.  Wondering what else they can do . Denies any other pedal complaints. Denies n/v/f/c.   Past Medical History:  Diagnosis Date   Anxiety    Arthritis    Asthma    Bicuspid aortic valve 05/2017   Likely functional bicuspid aortic valve with sclerosis and no stenosis.   Bursitis    Cardiac arrest (HCC)    x 2 (within an hour) June 2023   Cataract    mild   Cognitive change    Dyspnea    occasional   Dysrhythmia    A-fib   Dysthymia    GERD (gastroesophageal reflux disease)    Hemorrhoids    Hx of ulcerative colitis    per dr Larrie Po as per pt.   Hyperlipidemia    Hypothyroidism    Labile hypertension    managed - labile.   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 Luna Salinas)   Thyroid  disease     Objective:  Physical Exam: Vascular: DP/PT pulses 2/4 bilateral. CFT <3 seconds. Normal hair growth on digits. No edema.  Skin. No lacerations or abrasions bilateral feet. Hyperkeratotic cored lesion nearly ulcerative on medial right PIPJ.  Musculoskeletal: MMT 5/5 bilateral lower extremities in DF, PF, Inversion and Eversion. Deceased ROM in DF of ankle joint. Hammered second digit on right with mild HAV deformity noted on right.  Neurological: Sensation intact to light touch.   Assessment:   1. Hammer toe of right foot   2. Corns and  callosities      Plan:  Patient was evaluated and treated and all questions answered. -Educated on hammertoes and corns and treatment options  -Discussed padding including toe caps and crest pads. -hyperkeratotic tissue debrided with chisel without incident as courtesy.   -Discussed need for potential surgery if pain does not improved.  -Patient to follow-up as needed. Discussed calling if any changes or increased pain.    Jennefer Moats, DPM

## 2023-12-22 ENCOUNTER — Encounter: Payer: Self-pay | Admitting: Internal Medicine

## 2023-12-23 ENCOUNTER — Other Ambulatory Visit: Payer: Self-pay | Admitting: Physician Assistant

## 2024-01-14 ENCOUNTER — Telehealth: Payer: Self-pay | Admitting: Physician Assistant

## 2024-01-14 NOTE — Telephone Encounter (Signed)
 Pt's caregiver called in stating she thinks the pt is having some side effects from the Rexulti. She has fallen a few times, getting depressed, fluctuating appetite, weak, headaches, and has a runny nose constantly.  I let her know she is not on the DPR, she says to call the pt's husband back.

## 2024-01-15 NOTE — Telephone Encounter (Signed)
 Pt's husband called in stating he got confused and wants to make sure he knows exactly which medicine the pt should stop?

## 2024-01-15 NOTE — Telephone Encounter (Signed)
 I advised to husband, he is going to stop the medication. Will follow up as scheduled.

## 2024-01-22 ENCOUNTER — Encounter: Payer: Self-pay | Admitting: Internal Medicine

## 2024-01-22 ENCOUNTER — Other Ambulatory Visit: Payer: Self-pay | Admitting: Internal Medicine

## 2024-01-23 ENCOUNTER — Other Ambulatory Visit: Payer: Self-pay

## 2024-01-26 MED ORDER — ESZOPICLONE 3 MG PO TABS: 3.0000 mg | ORAL_TABLET | Freq: Every day | ORAL | 1 refills | Status: DC

## 2024-02-02 NOTE — Telephone Encounter (Signed)
No answer will call back later

## 2024-02-02 NOTE — Telephone Encounter (Signed)
 Revonda Standard the daugther is on POA, she restarted the Rexulti. Going to stay on it and report back in two weeks, condition status. She thanked me for calling and clarifying this.

## 2024-02-02 NOTE — Telephone Encounter (Signed)
 When she was on Rexulti, Marissa [name]. Having a lot of agression, frustration. Hasn't worse. Daughter is very concerned. Will take any appts asap. [phone].

## 2024-02-04 ENCOUNTER — Encounter: Payer: Self-pay | Admitting: Cardiology

## 2024-02-04 ENCOUNTER — Other Ambulatory Visit: Payer: Self-pay

## 2024-02-04 MED ORDER — LOSARTAN POTASSIUM 100 MG PO TABS: 100.0000 mg | ORAL_TABLET | Freq: Every day | ORAL | 2 refills | Status: DC

## 2024-02-06 ENCOUNTER — Other Ambulatory Visit: Payer: Self-pay | Admitting: Thoracic Surgery (Cardiothoracic Vascular Surgery)

## 2024-02-06 DIAGNOSIS — R911 Solitary pulmonary nodule: Secondary | ICD-10-CM

## 2024-02-10 DIAGNOSIS — K080 Exfoliation of teeth due to systemic causes: Secondary | ICD-10-CM | POA: Diagnosis not present

## 2024-02-13 ENCOUNTER — Other Ambulatory Visit: Payer: Self-pay | Admitting: Internal Medicine

## 2024-02-17 ENCOUNTER — Other Ambulatory Visit (HOSPITAL_COMMUNITY): Payer: Self-pay

## 2024-02-17 ENCOUNTER — Telehealth: Payer: Self-pay

## 2024-02-17 NOTE — Telephone Encounter (Signed)
 I received a request to do a prior auth for Fluoxetine  20 mg tablets. These are not on her plans formulary. I did a test claim for the 20 mg capsules and they will cover that at a $0 charge. If the Dr feels this is clinically appropriate please send in new rx to patient's preferred pharmacy.

## 2024-02-18 ENCOUNTER — Other Ambulatory Visit: Payer: Self-pay

## 2024-02-18 MED ORDER — FLUOXETINE HCL 20 MG PO CAPS: 20.0000 mg | ORAL_CAPSULE | Freq: Two times a day (BID) | ORAL | 1 refills | Status: DC

## 2024-02-18 NOTE — Telephone Encounter (Signed)
 Ok to change to 40 mg daily (2 of the 20 mg capsules) same medication #180 1 refill.

## 2024-02-18 NOTE — Telephone Encounter (Signed)
 A new rx will need to be sent to pharmacy please.

## 2024-02-19 ENCOUNTER — Encounter: Payer: Self-pay | Admitting: Physician Assistant

## 2024-02-19 ENCOUNTER — Ambulatory Visit: Payer: Medicare Other | Admitting: Physician Assistant

## 2024-02-19 VITALS — BP 119/76 | HR 89 | Resp 20 | Wt 132.0 lb

## 2024-02-19 DIAGNOSIS — R4689 Other symptoms and signs involving appearance and behavior: Secondary | ICD-10-CM

## 2024-02-19 DIAGNOSIS — F02818 Dementia in other diseases classified elsewhere, unspecified severity, with other behavioral disturbance: Secondary | ICD-10-CM | POA: Diagnosis not present

## 2024-02-19 DIAGNOSIS — G309 Alzheimer's disease, unspecified: Secondary | ICD-10-CM | POA: Diagnosis not present
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
## 2024-02-19 NOTE — Telephone Encounter (Signed)
 I have sent in the new rx

## 2024-02-24 ENCOUNTER — Telehealth: Payer: Self-pay | Admitting: Physician Assistant
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
## 2024-02-24 NOTE — Telephone Encounter (Signed)
 Left message with the after hour service on 02-24-24 12:37 pm    Caller states that she wants to speak with the nurse about the patient DX and things

## 2024-02-25 ENCOUNTER — Telehealth: Payer: Self-pay | Admitting: Physician Assistant

## 2024-02-25 NOTE — Telephone Encounter (Signed)
 Left a message on the VM wanting to know where we referred patient for Psychiatry

## 2024-02-26 ENCOUNTER — Telehealth: Payer: Self-pay | Admitting: Physician Assistant

## 2024-02-26 ENCOUNTER — Telehealth: Payer: Self-pay | Admitting: Internal Medicine

## 2024-02-26 NOTE — Telephone Encounter (Signed)
Called pt daughter lvm

## 2024-02-26 NOTE — Telephone Encounter (Signed)
 Pt's husband called in after pharmacy mentioned the pt might not need to be on Rexulti  and Fluoxetine . They were picking up Fluoxetine  rx. He wants to double check that it's ok to be on both of the medications.

## 2024-02-26 NOTE — Telephone Encounter (Unsigned)
 Copied from CRM [id number]. Topic: General - Other >> Feb 26, 2024 12:17 PM Magdalene [employer] wrote: Reason for CRM: Patient's daughter,Ms. Marissa [name], is calling to request a competency letter for her mom stating that she is competent to speak with her lawyer.  She would like to know if she can send a template to be filled out or if an appointment is required.   Allison's call back number is [phone]

## 2024-02-26 NOTE — Telephone Encounter (Signed)
 [facility])  Psychiatrist [address] [place] [address]  [phone]   LMOVNM with Referral info.

## 2024-02-26 NOTE — Telephone Encounter (Signed)
 Called patients husband and gave Sara's recommendations. Patients husband did say that patient has upcoming appointment on May 18th or 19th

## 2024-03-02 ENCOUNTER — Telehealth: Payer: Self-pay | Admitting: Podiatry

## 2024-03-02 ENCOUNTER — Telehealth: Payer: Self-pay | Admitting: Physician Assistant

## 2024-03-02 NOTE — Telephone Encounter (Signed)
 Patients daughter called asking to speak to someone about her mother. The daughter Satira Curet states she was given a cream and she hasn't been using it. The daughter would like a call back at [phone].
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
## 2024-03-03 NOTE — Telephone Encounter (Signed)
 No answer at 3:18 03/03/2024

## 2024-03-04 ENCOUNTER — Telehealth: Payer: Self-pay

## 2024-03-04 NOTE — Telephone Encounter (Signed)
 I sent my chart to give more information via my chart.

## 2024-03-04 NOTE — Telephone Encounter (Signed)
 [facility] number is [phone]. Will call again this am

## 2024-03-04 NOTE — Telephone Encounter (Signed)
 Is taken Rexulti  2mg , behavioral issues, per Tex Filbert, PA-C. Increase to depakote  250 twice a day per Abraham Hoffmann for now and call with report. Will Call [facility].Margretta Shi thanked me for [phone].

## 2024-03-05 ENCOUNTER — Other Ambulatory Visit: Payer: Self-pay | Admitting: Physician Assistant

## 2024-03-05 ENCOUNTER — Other Ambulatory Visit: Payer: Self-pay | Admitting: Internal Medicine

## 2024-03-05 ENCOUNTER — Telehealth: Payer: Self-pay | Admitting: Physician Assistant

## 2024-03-05 MED ORDER — BREXPIPRAZOLE 2 MG PO TABS: ORAL_TABLET | ORAL | 5 refills | Status: DC

## 2024-03-05 MED ORDER — DIVALPROEX SODIUM ER 250 MG PO TB24: ORAL_TABLET | ORAL | 3 refills | Status: DC

## 2024-03-05 MED ORDER — DIVALPROEX SODIUM 250 MG PO DR TAB: 250.0000 mg | DELAYED_RELEASE_TABLET | Freq: Two times a day (BID) | ORAL | 3 refills | Status: DC

## 2024-03-05 NOTE — Telephone Encounter (Signed)
 1. Which medications need refilled? (List name and dosage, if known) depakote  was increased to 2 a day, but they don't have enough to give her that much and the pharmacy won't refill it until 03/14/24.  2. Which pharmacy/location is medication to be sent to? (include street and city if local pharmacy) [address]

## 2024-03-09 ENCOUNTER — Telehealth: Payer: Self-pay | Admitting: Physician Assistant

## 2024-03-09 ENCOUNTER — Other Ambulatory Visit: Payer: Self-pay | Admitting: Internal Medicine

## 2024-03-09 NOTE — Telephone Encounter (Unsigned)
 Copied from CRM [id number]. Topic: Clinical - Medication Refill >> Mar 09, 2024 10:33 AM Luane Rumps D wrote: Medication: montelukast  (SINGULAIR ) 10 MG tablet  Has the patient contacted their pharmacy? Yes (Agent: If no, request that the patient contact the pharmacy for the refill. If patient does not wish to contact the pharmacy document the reason why and proceed with request.) (Agent: If yes, when and what did the pharmacy advise?)  This is the patient's preferred pharmacy:  [id number] - [place], [place] - [address] AT [address] [place] [address] Phone: [phone] Fax: [phone]  Is this the correct pharmacy for this prescription? Yes If no, delete pharmacy and type the correct one.   Has the prescription been filled recently? Yes  Is the patient out of the medication? Yes  Has the patient been seen for an appointment in the last year OR does the patient have an upcoming appointment? Yes  Can we respond through MyChart? No  Agent: Please be advised that Rx refills may take up to 3 business days. We ask that you follow-up with your pharmacy.

## 2024-03-09 NOTE — Telephone Encounter (Signed)
 Left message with the after hour service on 03-09-24   Caller states that wife has been on Depakote  and just picked up 2 Rx one says  extended release and the other says delay release. He wants to know what the difference is, has been giving extended release but dosage has been upped to 2 per day    Both RX say two a day

## 2024-03-09 NOTE — Telephone Encounter (Signed)
 I advised to take the DR. Per Griffin Lecher.

## 2024-03-10 ENCOUNTER — Other Ambulatory Visit: Payer: Self-pay | Admitting: Internal Medicine

## 2024-03-10 DIAGNOSIS — E039 Hypothyroidism, unspecified: Secondary | ICD-10-CM

## 2024-03-10 MED ORDER — MONTELUKAST SODIUM 10 MG PO TABS: 10.0000 mg | ORAL_TABLET | Freq: Every day | ORAL | 1 refills | Status: DC

## 2024-03-12 ENCOUNTER — Encounter: Payer: Self-pay | Admitting: Thoracic Surgery (Cardiothoracic Vascular Surgery)

## 2024-03-12 ENCOUNTER — Telehealth: Payer: Self-pay | Admitting: Physician Assistant

## 2024-03-12 ENCOUNTER — Ambulatory Visit: Admitting: Internal Medicine

## 2024-03-12 NOTE — Telephone Encounter (Signed)
 Called and left message to call office back

## 2024-03-12 NOTE — Telephone Encounter (Signed)
 Patient daughter asked for a phone call back as she needs to speak to someone about medication and type of care that patient may need

## 2024-03-15 ENCOUNTER — Ambulatory Visit
Admission: RE | Admit: 2024-03-15 | Discharge: 2024-03-15 | Disposition: A | Discharge status: Home / Self Care | Source: Ambulatory Visit | Attending: Thoracic Surgery (Cardiothoracic Vascular Surgery) | Admitting: Thoracic Surgery (Cardiothoracic Vascular Surgery)

## 2024-03-15 DIAGNOSIS — R911 Solitary pulmonary nodule: Secondary | ICD-10-CM

## 2024-03-16 ENCOUNTER — Ambulatory Visit (INDEPENDENT_AMBULATORY_CARE_PROVIDER_SITE_OTHER): Admitting: Internal Medicine

## 2024-03-16 ENCOUNTER — Encounter: Payer: Self-pay | Admitting: Internal Medicine

## 2024-03-16 VITALS — BP 134/80 | HR 68 | Temp 97.9°F | Ht 64.0 in | Wt 129.0 lb

## 2024-03-16 DIAGNOSIS — D6869 Other thrombophilia: Secondary | ICD-10-CM | POA: Diagnosis not present

## 2024-03-16 DIAGNOSIS — Z8674 Personal history of sudden cardiac arrest: Secondary | ICD-10-CM | POA: Diagnosis not present

## 2024-03-16 DIAGNOSIS — I480 Paroxysmal atrial fibrillation: Secondary | ICD-10-CM

## 2024-03-16 DIAGNOSIS — F32A Depression, unspecified: Secondary | ICD-10-CM

## 2024-03-16 DIAGNOSIS — R569 Unspecified convulsions: Secondary | ICD-10-CM

## 2024-03-16 DIAGNOSIS — F03918 Unspecified dementia, unspecified severity, with other behavioral disturbance: Secondary | ICD-10-CM | POA: Diagnosis not present

## 2024-03-16 NOTE — Assessment & Plan Note (Signed)
 She has frequent falls and unclear historian if she is having episodes recently or not with epileptic type events. She is on trileptal  and depakote .
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
## 2024-03-16 NOTE — Assessment & Plan Note (Signed)
 Worse lately given social situation and would benefit from change in living environment.

## 2024-03-16 NOTE — Assessment & Plan Note (Signed)
 Seeing neurology and they have adjusted medications recently. I suspect that her medication adherence has not been ideal recently and may contribute to changes. It sounds that the best option would be a care facility for consistent care and appropriate care for her. Daughter and patient have plans to work with husband to see if they can get his done. If needed they will consider mediation and/or guardianship.

## 2024-03-16 NOTE — Assessment & Plan Note (Signed)
 With sequelae of this still and has cognitive change since this event.

## 2024-03-16 NOTE — Telephone Encounter (Signed)
 Pt's daughter Jerald Molly called and left a message returning our call

## 2024-03-23 ENCOUNTER — Ambulatory Visit
Attending: Thoracic Surgery (Cardiothoracic Vascular Surgery) | Admitting: Thoracic Surgery (Cardiothoracic Vascular Surgery)

## 2024-03-23 ENCOUNTER — Encounter: Payer: Self-pay | Admitting: Thoracic Surgery (Cardiothoracic Vascular Surgery)

## 2024-03-23 ENCOUNTER — Telehealth: Payer: Self-pay | Admitting: Radiation Oncology

## 2024-03-23 VITALS — BP 129/89 | HR 75 | Resp 18 | Ht 64.0 in | Wt 132.0 lb

## 2024-03-23 DIAGNOSIS — R911 Solitary pulmonary nodule: Secondary | ICD-10-CM

## 2024-03-23 DIAGNOSIS — I7121 Aneurysm of the ascending aorta, without rupture: Secondary | ICD-10-CM

## 2024-03-23 NOTE — Telephone Encounter (Signed)
 Left message for patient to call back to schedule consult per 5/27 referral.

## 2024-03-23 NOTE — Progress Notes (Signed)
 [address]       Marissa [name] [address]             [phone]     HPI: [name] returns for follow-up of the right upper lobe opacity.  She is accompanied by her husband as well as her caregiver.  Marissa [name] is a 77 year old woman with a history of hypertension, hyperlipidemia, mitral prolapse, bicuspid aortic valve, mild aortic stenosis, ascending aneurysm, atrial fibrillation, V-fib arrest, strokes, multiple falls, arthritis, ulcerative colitis, chronic pain, a right upper lobe groundglass opacity and dementia.   She has been followed for an ascending aneurysm since 2012.  In November 2022 the CT showed a new central groundglass opacity in the right upper lobe.  There were also calcified hilar lymph nodes.  Over time the groundglass opacity has slowly grown.  We did a navigational bronchoscopy in March 2024.  It was negative for malignancy.   She has dementia and has had episodes of agitation.  She is not a surgical candidate so we have continue to follow her radiographically.  She says she feels well but is not an entirely reliable historian.  Her husband says her appetite is good.  She has not been complaining of chest pain or shortness of breath.  Past Medical History:  Diagnosis Date   Anxiety    Arthritis    Asthma    Bicuspid aortic valve 05/2017   Likely functional bicuspid aortic valve with sclerosis and no stenosis.   Bursitis    Cardiac arrest (HCC)    x 2 (within an hour) June 2023   Cataract    mild   Cognitive change    Dyspnea    occasional   Dysrhythmia    A-fib   Dysthymia    GERD (gastroesophageal reflux disease)    Hemorrhoids    Hx of ulcerative colitis    per dr Larrie Po as per pt.   Hyperlipidemia    Hypothyroidism    Labile hypertension    managed - labile.   Migraine    Pneumonia    several times   Scoliosis    Slurred speech    temporal lobe area that is not a tumor causes occ slurred speech and inability to  communicate/ words will not come out at the correct time   Spinal stenosis    Stroke (HCC) 08/06/2021   Thoracic aortic aneurysm (HCC)    Stable 4.2-4.3 cm (followed by Dr. Luna Salinas)   Thyroid  disease     Current Outpatient Medications  Medication Sig Dispense Refill   albuterol  (VENTOLIN  HFA) 108 (90 Base) MCG/ACT inhaler INHALE 2 PUFFS INTO THE LUNGS EVERY 6 HOURS AS NEEDED FOR WHEEZING OR SHORTNESS OF BREATH 6.7 g 0   ALPRAZolam  (XANAX ) 0.5 MG tablet Take 1 tablet (0.5 mg total) by mouth 2 (two) times daily as needed for anxiety. 60 tablet 3   brexpiprazole  (REXULTI ) 2 MG TABS tablet Take 1 tablet every evening 30 tablet 5   cloNIDine  (CATAPRES ) 0.2 MG tablet Take 0.1 mg by mouth 2 (two) times daily as needed (systolic over 160). if blood pressure is great than 160 systolic for more than an hour (Patient taking differently: Take 0.1 mg by mouth 2 (two) times daily as needed (sBP > 160 for over 1 hour).) 30 tablet 6   divalproex  (DEPAKOTE ) 250 MG DR tablet Take 1 tablet (250 mg total) by mouth 2 (two) times daily. 180 tablet 3   Eszopiclone   3 MG TABS Take 1 tablet (3 mg total) by mouth at bedtime. 90 tablet 1   FLUoxetine  (PROZAC ) 20 MG capsule Take 1 capsule (20 mg total) by mouth 2 (two) times daily. 180 capsule 1   HYDROcodone -acetaminophen  (NORCO/VICODIN) 5-325 MG tablet Take 1 tablet by mouth daily as needed for moderate pain. 20 tablet 0   hydrOXYzine  (VISTARIL ) 25 MG capsule Take 1 capsule (25 mg total) by mouth every 8 (eight) hours as needed. 90 capsule 5   latanoprost  (XALATAN ) 0.005 % ophthalmic solution Place 1 drop into both eyes at bedtime.     levothyroxine  (SYNTHROID ) 125 MCG tablet TAKE 1 TABLET(125 MCG) BY MOUTH DAILY 90 tablet 1   losartan  (COZAAR ) 100 MG tablet Take 1 tablet (100 mg total) by mouth daily. 90 tablet 2   magnesium  oxide (MAG-OX) 400 MG tablet Take 1 tablet (400 mg total) by mouth 2 (two) times daily. (Patient taking differently: Take 400 mg by mouth  daily.) 60 tablet 0   montelukast  (SINGULAIR ) 10 MG tablet TAKE 1 TABLET(10 MG) BY MOUTH AT BEDTIME 90 tablet 3   Multiple Vitamin (MULTIVITAMIN WITH MINERALS) TABS tablet Take 1 tablet by mouth daily.     naproxen  sodium (ALEVE ) 220 MG tablet Take 440 mg by mouth daily as needed (pain).     nebivolol  (BYSTOLIC ) 10 MG tablet TAKE 1 TABLET(10 MG) BY MOUTH DAILY AT 8 PM 90 tablet 3   Oxcarbazepine  (TRILEPTAL ) 300 MG tablet Take 1 tablet twice a day 60 tablet 6   Respiratory Therapy Supplies (FLUTTER) DEVI Use as directed 1 each 0   triamcinolone  cream (KENALOG ) 0.1 % Apply 1 Application topically 2 (two) times daily. (Patient taking differently: Apply 1 Application topically 2 (two) times daily as needed (irritation).) 100 g 0   furosemide  (LASIX ) 20 MG tablet Take 1 tablet (20 mg total) by mouth daily as needed. For feet edema that doesn't resolve with elevation and wearing support hose. 60 tablet 3   No current facility-administered medications for this visit.    Physical Exam BP 129/89 (BP Location: Left Arm)   Pulse 75   Resp 18   Ht 5\' 4"  (1.626 m)   Wt 132 lb (59.9 kg)   SpO2 97%   BMI 22.11 kg/m  78 year old woman in no acute distress Alert, oriented to person and place Lungs clear Cardiac regular rate and rhythm  Diagnostic Tests: CT CHEST WITHOUT CONTRAST   TECHNIQUE: Multidetector CT imaging of the chest was performed following the standard protocol without IV contrast.   RADIATION DOSE REDUCTION: This exam was performed according to the departmental dose-optimization program which includes automated exposure control, adjustment of the mA and/or kV according to patient size and/or use of iterative reconstruction technique.   COMPARISON:  September 01, 2023, April 29, 2023, September 18, 2022, September 11, 2021   FINDINGS: Cardiovascular: No cardiomegaly or pericardial effusion. Unchanged fusiform aneurysm of the ascending aorta measuring 4.5 cm. The aortic arch and  descending aorta are not dilated. Scattered aortic atherosclerosis. Chunky plaque at the ostium of the left common carotid artery causing at least moderate stenosis. Dense calcified atherosclerosis in the LAD.   Mediastinum/Nodes: No mediastinal mass. No mediastinal, hilar, or axillary lymphadenopathy. Calcified right paratracheal and right hilar lymph nodes, likely representing chronic granulomatous infection.   Lungs/Pleura: The midline trachea and bronchi are patent. Redemonstration of irregular region of peribronchial ground-glass airspace opacity, measuring 4.2 x 3.1 x 3.6 cm (previously, 3.8 x 2.9 x 3.7 cm). No new  airspace consolidation, pleural effusion, or pneumothorax. Punctate calcified granuloma in the right upper lobe posteriorly.   Musculoskeletal: No acute fracture or destructive bone lesion. Diffuse osteopenia. Remote, healed sternal body fracture. Multilevel degenerative disc disease of the spine. Multiple remote bilateral rib fractures.   Upper Abdomen: No acute abnormality in the partially visualized upper abdomen.   IMPRESSION: 1. Redemonstrated irregular peribronchial ground-glass airspace opacity in the right upper lobe, measuring 4.2 x 3.1 x 3.6 cm (previously, 3.8 x 2.9 x 3.7 cm), by my measurement. Since November 2022, this has continued to increase in size, where it previously measured 2.3 x 2.1 cm). 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 2. Unchanged fusiform aneurysm of the ascending aorta, measuring 4.5 cm. Continued follow-up is recommended, as documented below. Ascending thoracic aortic aneurysm. Recommend semi-annual imaging followup by CTA or MRA and referral to cardiothoracic surgery if not already obtained. This recommendation follows 2010 ACCF/AHA/AATS/ACR/ASA/SCA/SCAI/SIR/STS/SVM Guidelines for the Diagnosis and Management of Patients With Thoracic Aortic Disease. Circulation. 2010; 121: U045-W098. Aortic aneurysm NOS  (ICD10-I71.9)   Aortic Atherosclerosis (ICD10-I70.0).     Electronically Signed   By: Rance Burrows M.D.   On: 03/15/2024 13:31 I personally reviewed the CT images.  No change in the ascending aneurysm at 4.5 cm.  Slight increase in size of the right upper lobe groundglass opacity.  Calcified granuloma.  Impression: Marissa [name] is a 78 year old woman with a history of hypertension, hyperlipidemia, mitral prolapse, bicuspid aortic valve, mild aortic stenosis, ascending aneurysm, atrial fibrillation, V-fib arrest, strokes, multiple falls, arthritis, ulcerative colitis, chronic pain, a right upper lobe groundglass opacity and dementia.  Ascending aneurysm-stable about 4.5 cm.  Not a candidate for surgery.  No indication for surgery at the present time.  Can continue to follow every 6 months.  Right upper lobe groundglass opacity-continues to slowly grow.  We biopsied about a year ago and pathology showed macrophages but no evidence of malignancy.  Cannot rule out the possibility of a low-grade adenocarcinoma.  She and her husband do not want to undergo another biopsy.  She is not a candidate for surgical resection.  We discussed potential referral to radiation oncology to see if she might be a candidate for SBRT or hypofractionated radiation empirically.  Her husband was not enthusiastic about it but she does wish to talk to them.  Will arrange that referral.  Plan: Refer to radiation oncology for consideration of treatment without biopsy   Return in 6 months with CT chest to follow-up on ascending aneurysm and right upper lobe groundglass opacity.  I spent over 20 minutes in review of records, images, and in consultation with Mrs. Ratajczak today.  Zelphia Higashi, MD [facility] [phone]

## 2024-03-30 NOTE — Progress Notes (Signed)
 Thoracic Location of Tumor / Histology: Right Upper Lobe Lung   Patient has Dementia.  Lives with her husband and she has a caretaker Antony Baumgartner.  Her husband is hoping to not have radiation treatments due to her other health concerns.   Patient has been followed with CT imaging for an ascending aneurysm since 2012.  In November 2022 the CT showed a new central groundglass opacity in the right upper lobe.  There were also calcified hilar lymph nodes.  Over time the groundglass opacity has slowly grown.  We did a navigational bronchoscopy in March 2024.  It was negative for malignancy.      Past/Anticipated interventions by cardiothoracic surgery, if any:  Dr. Luna Salinas 03/23/2024 -Right upper lobe groundglass opacity-continues to slowly grow.  We biopsied about a year ago and pathology showed macrophages but no evidence of malignancy.  Cannot rule out the possibility of a low-grade adenocarcinoma.   -She and her husband do not want to undergo another biopsy.  She is not a candidate for surgical resection. -We discussed potential referral to radiation oncology to see if she might be a candidate for SBRT or hypofractionated radiation empirically.   -Her husband was not enthusiastic about it but she does wish to talk to them.     Past/Anticipated interventions by medical oncology, if any:    Tobacco/Marijuana/Snuff/ETOH use: None  Signs/Symptoms Weight changes, if any: Stable. Respiratory complaints, if any: Husband and caretaker denies SOB. Hemoptysis, if any: Husband and caretaker denies cough or hemoptysis. Pain issues, if any:  She reports chest pain, husband gives her aleve .  SAFETY ISSUES: Prior radiation? No Pacemaker/ICD?  No Possible current pregnancy? Hysterectomy Is the patient on methotrexate? No  Current Complaints / other details:

## 2024-04-05 NOTE — Progress Notes (Signed)
 Radiation Oncology         ([phone]) [phone] ________________________________  Name: Marissa [name]        MRN: [id number]  Date of Service: 04/06/2024 DOB: 1945/11/24  WU:[id number], Marjory Signs, MD  Zelphia Higashi, *     REFERRING PHYSICIAN: Zelphia Higashi, *   DIAGNOSIS: The encounter diagnosis was Solitary pulmonary nodule.   HISTORY OF PRESENT ILLNESS: Marissa [name] is a 78 y.o. female seen at the request of Dr. Luna Salinas for a putative stage I lung cancer.  The patient underwent consultation today through a telephone visit due to the patient's difficulty in coming to the clinic.  The patient has been followed due to an ascending aneurysm 4 more than a decade and in November 2022 her surveillance showed a groundglass opacity in her right upper lobe and calcified hilar lymph nodes.  The patient has been followed in surveillance over this timeframe, and in November 2023 the area measured up to 4.4 cm which had increased in size from the previous 2.9 cm measurement, she was offered bronchoscopy which was performed on 01/17/2023 and was negative for malignancy showing numerous benign reactive bronchial cells and pulmonary macrophages.  She continued in surveillance with a CT of the chest without contrast on 04/29/2023 showing no substantial change, in November 2024 the groundglass opacity in the right upper lobe measured 3.9 cm.  She returned for additional imaging on 03/15/2024 and this again demonstrated groundglass airspace opacity in the right upper lobe measuring 4.2 cm.  It was still felt to be concerning for early transformation of malignancy.  She also has had an unchanged 4.5 cm ascending aortic aneurysm.  Dr. Luna Salinas does not feel that she is a good surgical candidate for resection due to her comorbidities including dementia and she is not interested in an additional biopsy.  She is seen to consider definitive stereotactic body radiotherapy without tissue  confirmation.    PREVIOUS RADIATION THERAPY: No   PAST MEDICAL HISTORY:  Past Medical History:  Diagnosis Date   Anxiety    Arthritis    Asthma    Bicuspid aortic valve 05/2017   Likely functional bicuspid aortic valve with sclerosis and no stenosis.   Bursitis    Cardiac arrest (HCC)    x 2 (within an hour) June 2023   Cataract    mild   Cognitive change    Dyspnea    occasional   Dysrhythmia    A-fib   Dysthymia    GERD (gastroesophageal reflux disease)    Hemorrhoids    Hx of ulcerative colitis    per dr Larrie Po as per pt.   Hyperlipidemia    Hypothyroidism    Labile hypertension    managed - labile.   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 Luna Salinas)   Thyroid  disease        PAST SURGICAL HISTORY: Past Surgical History:  Procedure Laterality Date   ABDOMINAL HYSTERECTOMY     BREAST EXCISIONAL BIOPSY Left    BUNIONECTOMY     Cardiac Event Monitor  07/2017   Overall relatively normal.  Normal  sinus rhythm with rare bradycardia and tachycardia.  Heart rate ranged from 55-110 bpm.  Occasional PACs and PVCs, every single 1 was felt.  No arrhythmias other than one short 4 beat run of PACs.   COLONOSCOPY  02/04/2005   all normal    FOOT SURGERY     3 pins in toes    HAND SURGERY     left thumb joint resection   LOOP RECORDER INSERTION N/A 08/09/2021   Procedure: LOOP RECORDER INSERTION;  Surgeon: Boyce Byes, MD;  Location: [facility];  Service: Cardiovascular;  Laterality: N/A;   nasal revision     NM MYOVIEW  LTD  07/2017   LOW RISK study.  No ischemia or infarction.  EF greater than 65%.    SINUS IRRIGATION     TONSILLECTOMY AND ADENOIDECTOMY      TRANSTHORACIC ECHOCARDIOGRAM  06/'1, 8/'19   a) Moderate LVH. Normal EF 60-65%. Normal diastolic parameters. --> Difficult to fully visualize the aortic valve. Cannot exclude bicuspid valve. Mild aortic stenosis noted. No PFO. Mildly dilated left atrium. Trivial MR. No comment on mitral valve prolapse. Moderately dilated ascending aorta.;; b) F/u Echo To evaluate the aortic valve. -- Bicuspid AoV - mildly thickened / calcified. - No stenosis   TRANSTHORACIC ECHOCARDIOGRAM  08/08/2021   EF 60 to 65%.  Normal diastolic parameters?.   ?  Functional bicuspid aortic valve with R&L cusp sommisure.  Very sclerotic.  Stable gradients ~mean gradient 10 mmHg. Very Mild stenosis. Aorta dilated to 42 mm.  Stable.  RAP 3 mmHg   TRANSTHORACIC ECHOCARDIOGRAM  05/16/2022   EF 60 to 65%.  No edema.  Mild LVH.  Moderately Story function.  Mild by atrial enlargement.  Normal RAP for functionally bicuspid aortic valve.Aorta 43 mm.   VIDEO BRONCHOSCOPY WITH ENDOBRONCHIAL NAVIGATION N/A 01/17/2023   Procedure: VIDEO BRONCHOSCOPY WITH ENDOBRONCHIAL NAVIGATION;  Surgeon: Zelphia Higashi, MD;  Location[facility];  Service: Thoracic;  Laterality: N/A;     FAMILY HISTORY:  Family History  Problem Relation Age of Onset   Arthritis Mother    Leukemia Father    Heart disease Maternal Uncle    Diabetes Paternal Aunt    Breast cancer Maternal Grandmother    Heart disease Maternal Grandfather    Breast cancer Paternal Grandmother    Colon cancer Neg Hx      SOCIAL HISTORY:  reports that she has never smoked. She has never been exposed to tobacco smoke. She has never used smokeless tobacco. She reports that she does not currently use alcohol after a past usage of about 7.0 standard drinks of alcohol per week. She reports that she does not use drugs.  The patient is married and lives in [place].   ALLERGIES: Crestor  [rosuvastatin ], Duragesic -100 [fentanyl ], Pacerone  [amiodarone ], Tikosyn  [dofetilide ], Apresoline   [hydralazine ], Hctz [hydrochlorothiazide ], Oxycontin  [oxycodone ], Amoxil  [amoxicillin ], Codeine, Conjugated estrogens, Erythromycin, and Feldene [piroxicam]   MEDICATIONS:  Current Outpatient Medications  Medication Sig Dispense Refill   albuterol  (VENTOLIN  HFA) 108 (90 Base) MCG/ACT inhaler INHALE 2 PUFFS INTO THE LUNGS EVERY 6 HOURS AS NEEDED FOR WHEEZING OR SHORTNESS OF BREATH 6.7 g 0   ALPRAZolam  (XANAX ) 0.5 MG tablet Take 1 tablet (0.5 mg total) by mouth 2 (two) times daily as needed for anxiety. 60 tablet 3   brexpiprazole  (REXULTI ) 2 MG TABS tablet Take 1 tablet every evening 30 tablet 5   cloNIDine  (CATAPRES ) 0.2 MG tablet Take 0.1 mg by mouth 2 (two) times daily as needed (systolic  over 160). if blood pressure is great than 160 systolic for more than an hour (Patient taking differently: Take 0.1 mg by mouth 2 (two) times daily as needed (sBP > 160 for over 1 hour).) 30 tablet 6   divalproex  (DEPAKOTE ) 250 MG DR tablet Take 1 tablet (250 mg total) by mouth 2 (two) times daily. 180 tablet 3   Eszopiclone  3 MG TABS Take 1 tablet (3 mg total) by mouth at bedtime. 90 tablet 1   FLUoxetine  (PROZAC ) 20 MG capsule Take 1 capsule (20 mg total) by mouth 2 (two) times daily. 180 capsule 1   furosemide  (LASIX ) 20 MG tablet Take 1 tablet (20 mg total) by mouth daily as needed. For feet edema that doesn't resolve with elevation and wearing support hose. 60 tablet 3   HYDROcodone -acetaminophen  (NORCO/VICODIN) 5-325 MG tablet Take 1 tablet by mouth daily as needed for moderate pain. 20 tablet 0   hydrOXYzine  (VISTARIL ) 25 MG capsule Take 1 capsule (25 mg total) by mouth every 8 (eight) hours as needed. 90 capsule 5   latanoprost  (XALATAN ) 0.005 % ophthalmic solution Place 1 drop into both eyes at bedtime.     levothyroxine  (SYNTHROID ) 125 MCG tablet TAKE 1 TABLET(125 MCG) BY MOUTH DAILY 90 tablet 1   losartan  (COZAAR ) 100 MG tablet Take 1 tablet (100 mg total) by mouth daily. 90 tablet 2   magnesium   oxide (MAG-OX) 400 MG tablet Take 1 tablet (400 mg total) by mouth 2 (two) times daily. (Patient taking differently: Take 400 mg by mouth daily.) 60 tablet 0   montelukast  (SINGULAIR ) 10 MG tablet TAKE 1 TABLET(10 MG) BY MOUTH AT BEDTIME 90 tablet 3   Multiple Vitamin (MULTIVITAMIN WITH MINERALS) TABS tablet Take 1 tablet by mouth daily.     naproxen  sodium (ALEVE ) 220 MG tablet Take 440 mg by mouth daily as needed (pain).     nebivolol  (BYSTOLIC ) 10 MG tablet TAKE 1 TABLET(10 MG) BY MOUTH DAILY AT 8 PM 90 tablet 3   Oxcarbazepine  (TRILEPTAL ) 300 MG tablet Take 1 tablet twice a day 60 tablet 6   Respiratory Therapy Supplies (FLUTTER) DEVI Use as directed 1 each 0   triamcinolone  cream (KENALOG ) 0.1 % Apply 1 Application topically 2 (two) times daily. (Patient taking differently: Apply 1 Application topically 2 (two) times daily as needed (irritation).) 100 g 0   No current facility-administered medications for this visit.     REVIEW OF SYSTEMS: Otherwise negative     PHYSICAL EXAM:  Wt Readings from Last 3 Encounters:  03/23/24 132 lb (59.9 kg)  03/16/24 129 lb (58.5 kg)  02/19/24 132 lb (59.9 kg)   Temp Readings from Last 3 Encounters:  03/16/24 97.9 F (36.6 C) (Oral)  09/22/23 98.7 F (37.1 C) (Oral)  08/05/23 98.3 F (36.8 C) (Oral)   BP Readings from Last 3 Encounters:  03/23/24 129/89  03/16/24 134/80  02/19/24 119/76   Pulse Readings from Last 3 Encounters:  03/23/24 75  03/16/24 68  02/19/24 89      ECOG = 3  0 - Asymptomatic (Fully active, able to carry on all predisease activities without restriction)  1 - Symptomatic but completely ambulatory (Restricted in physically strenuous activity but ambulatory and able to carry out work of a light or sedentary nature. For example, light housework, office work)  2 - Symptomatic, <50% in bed during the day (Ambulatory and capable of all self care but unable to carry out any work activities. Up and about more than  50%  of waking hours)  3 - Symptomatic, >50% in bed, but not bedbound (Capable of only limited self-care, confined to bed or chair 50% or more of waking hours)  4 - Bedbound (Completely disabled. Cannot carry on any self-care. Totally confined to bed or chair)  5 - Death   Aurea Blossom MM, Creech RH, Tormey DC, et al. [phone]). Toxicity and response criteria of the [facility]. Am. Hillard Lowes. Oncol. 5 (6): 649-55    LABORATORY DATA:  Lab Results  Component Value Date   WBC 5.4 07/18/2023   HGB 10.4 (L) 07/18/2023   HCT 31.5 (L) 07/18/2023   MCV 98.5 07/18/2023   PLT 217.0 07/18/2023   Lab Results  Component Value Date   NA 133 (L) 07/18/2023   K 4.1 07/18/2023   CL 97 07/18/2023   CO2 17 (L) 07/18/2023   Lab Results  Component Value Date   ALT 54 (H) 07/18/2023   AST 52 (H) 07/18/2023   ALKPHOS 69 07/18/2023   BILITOT 0.8 07/18/2023      RADIOGRAPHY: CT Chest Wo Contrast Result Date: 03/15/2024 CLINICAL DATA:  Lung nodule, 6-59mm EXAM: CT CHEST WITHOUT CONTRAST TECHNIQUE: Multidetector CT imaging of the chest was performed following the standard protocol without IV contrast. RADIATION DOSE REDUCTION: This exam was performed according to the departmental dose-optimization program which includes automated exposure control, adjustment of the mA and/or kV according to patient size and/or use of iterative reconstruction technique. COMPARISON:  September 01, 2023, April 29, 2023, September 18, 2022, September 11, 2021 FINDINGS: Cardiovascular: No cardiomegaly or pericardial effusion. Unchanged fusiform aneurysm of the ascending aorta measuring 4.5 cm. The aortic arch and descending aorta are not dilated. Scattered aortic atherosclerosis. Chunky plaque at the ostium of the left common carotid artery causing at least moderate stenosis. Dense calcified atherosclerosis in the LAD. Mediastinum/Nodes: No mediastinal mass. No mediastinal, hilar, or axillary lymphadenopathy. Calcified right  paratracheal and right hilar lymph nodes, likely representing chronic granulomatous infection. Lungs/Pleura: The midline trachea and bronchi are patent. Redemonstration of irregular region of peribronchial ground-glass airspace opacity, measuring 4.2 x 3.1 x 3.6 cm (previously, 3.8 x 2.9 x 3.7 cm). No new airspace consolidation, pleural effusion, or pneumothorax. Punctate calcified granuloma in the right upper lobe posteriorly. Musculoskeletal: No acute fracture or destructive bone lesion. Diffuse osteopenia. Remote, healed sternal body fracture. Multilevel degenerative disc disease of the spine. Multiple remote bilateral rib fractures. Upper Abdomen: No acute abnormality in the partially visualized upper abdomen. IMPRESSION: 1. Redemonstrated irregular peribronchial ground-glass airspace opacity in the right upper lobe, measuring 4.2 x 3.1 x 3.6 cm (previously, 3.8 x 2.9 x 3.7 cm), by my measurement. Since November 2022, this has continued to increase in size, where it previously measured 2.3 x 2.1 cm). 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 2. Unchanged fusiform aneurysm of the ascending aorta, measuring 4.5 cm. Continued follow-up is recommended, as documented below. Ascending thoracic aortic aneurysm. Recommend semi-annual imaging followup by CTA or MRA and referral to cardiothoracic surgery if not already obtained. This recommendation follows 2010 ACCF/AHA/AATS/ACR/ASA/SCA/SCAI/SIR/STS/SVM Guidelines for the Diagnosis and Management of Patients With Thoracic Aortic Disease. Circulation. 2010; 121: A213-Y865. Aortic aneurysm NOS (ICD10-I71.9) Aortic Atherosclerosis (ICD10-I70.0). Electronically Signed   By: Rance Burrows M.D.   On: 03/15/2024 13:31       IMPRESSION/PLAN: 1. Groundglass opacity in the right upper lobe possible putative stage IIA, [id number], NSCLC of the RUL.  I reviewed the patient's course and workup over time.  There has  been some waxing and waning in the measurement though  no resolution altogether of this finding.  We discussed today the possibility of a course of SBRT to the slowly enlarging groundglass lesion within the right upper lobe.  Biopsy previously in 2024 was negative but this continues to act suspiciously for a primary neoplasm.  I had a good discussion with the patient's husband today.  He feels that treatment would be very difficult for her at both in terms of coming to clinic and maybe being able to fully cooperate enough for radiation treatment.  He denies that she is experiencing any significant respiratory symptoms currently.  After this good discussion, it was decided that we would hold off on radiation treatment at this point, but rather continue follow-up with Dr. Luna Salinas with CT imaging.  If she develops symptoms or there is marked progression on imaging, then certainly we would be happy to further discuss once again the possibility of radiation treatment.  I do not believe that this would improve her quality of life for the time being so we will be available on a as needed basis going forward.   In a visit lasting 45 minutes, greater than 50% of the time was spent face to face discussing the patient's condition, in preparation for the discussion, and coordinating the patient's care.    ________________________________   Alix Isaac, MD, PhD    **Disclaimer: This note was dictated with voice recognition software. Similar sounding words can inadvertently be transcribed and this note may contain transcription errors which may not have been corrected upon publication of note.**

## 2024-04-06 ENCOUNTER — Encounter: Payer: Self-pay | Admitting: Radiation Oncology

## 2024-04-06 ENCOUNTER — Ambulatory Visit
Admission: RE | Admit: 2024-04-06 | Discharge: 2024-04-06 | Disposition: A | Discharge status: Home / Self Care | Source: Ambulatory Visit | Attending: Radiation Oncology | Admitting: Radiation Oncology

## 2024-04-06 VITALS — Ht 64.0 in | Wt 132.0 lb

## 2024-04-06 DIAGNOSIS — R911 Solitary pulmonary nodule: Secondary | ICD-10-CM | POA: Diagnosis not present

## 2024-04-06 DIAGNOSIS — C3411 Malignant neoplasm of upper lobe, right bronchus or lung: Secondary | ICD-10-CM

## 2024-04-07 DIAGNOSIS — C3411 Malignant neoplasm of upper lobe, right bronchus or lung: Secondary | ICD-10-CM | POA: Insufficient documentation

## 2024-04-14 ENCOUNTER — Ambulatory Visit (HOSPITAL_BASED_OUTPATIENT_CLINIC_OR_DEPARTMENT_OTHER): Payer: Self-pay | Admitting: Psychiatry

## 2024-04-14 DIAGNOSIS — G309 Alzheimer's disease, unspecified: Secondary | ICD-10-CM | POA: Diagnosis not present

## 2024-04-14 DIAGNOSIS — F03918 Unspecified dementia, unspecified severity, with other behavioral disturbance: Secondary | ICD-10-CM | POA: Diagnosis not present

## 2024-04-14 DIAGNOSIS — F02B11 Dementia in other diseases classified elsewhere, moderate, with agitation: Secondary | ICD-10-CM

## 2024-04-14 MED ORDER — BREXPIPRAZOLE 2 MG PO TABS: ORAL_TABLET | ORAL | 5 refills | Status: DC

## 2024-04-14 MED ORDER — FLUOXETINE HCL 40 MG PO CAPS: ORAL_CAPSULE | ORAL | 5 refills | Status: DC

## 2024-04-14 NOTE — Progress Notes (Signed)
 Psychiatric Initial Adult Assessment   Patient Identification: Marissa [name] MRN:  [id number] Date of Evaluation:  04/14/2024 Referral Source: Minna Amass Chief Complaint:  No chief complaint on file.  Visit Diagnosis: Dementia with behavioral disturbance  History of Present Illness:   This patient is a 78 year old white female diagnosed with Alzheimer's dementia moderate.  She has been married for 27 years.  Her husband and he is in the that evaluation.  Also in the evaluation is her nursing assistant mainly.  The patient apparently had experienced significant depressed mood state.  She is sleeping fairly well and eating well.  She has had periods where she is crying and being a bit.  He has questions of anhedonia.  She has made statements that she wanted to die.  Patient has never taken action to hurt herself.  Over the last number of months she had periods of significant agitation which is much better on Rexulti .  The patient drinks no alcohol and uses no drugs.  He has never had any psychotic symptomatology.  She denies symptoms consistent with generalized anxiety disorder or panic disorder.  Right now she is only doing fairly well with her basic ADLs.  She needs assistance to be addressed.  Lately she has needed assistance to being fat.  She is incontinent of urine and stool.  She has made no attempts to leave.  She takes her medicine as prescribed most of the time.  She takes Depakote  for his seizure disorder.  She also takes thyroid  medication.  The patient has been on Prozac  for an extended period of time. The patient has never been a psychiatric hospital and never been evaluated by a psychiatrist.  She has hypothyroidism and hypertension.  Associated Signs/Symptoms: Depression Symptoms:  depressed mood, (Hypo) Manic Symptoms:   Anxiety Symptoms:   Psychotic Symptoms:   PTSD Symptoms: NA  Past Psychiatric History: Rexulti , Prozac   Previous Psychotropic Medications: Yes    Substance Abuse History in the last 12 months:  No.  Consequences of Substance Abuse: Negative  Past Medical History:  Past Medical History:  Diagnosis Date   Anxiety    Arthritis    Asthma    Bicuspid aortic valve 05/2017   Likely functional bicuspid aortic valve with sclerosis and no stenosis.   Bursitis    Cardiac arrest (HCC)    x 2 (within an hour) June 2023   Cataract    mild   Cognitive change    Dyspnea    occasional   Dysrhythmia    A-fib   Dysthymia    GERD (gastroesophageal reflux disease)    Hemorrhoids    Hx of ulcerative colitis    per dr Larrie Po as per pt.   Hyperlipidemia    Hypothyroidism    Labile hypertension    managed - labile.   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 Luna Salinas)   Thyroid  disease     Past Surgical History:  Procedure Laterality Date   ABDOMINAL HYSTERECTOMY     BREAST EXCISIONAL BIOPSY Left    BUNIONECTOMY     Cardiac Event Monitor  07/2017  Overall relatively normal.  Normal sinus rhythm with rare bradycardia and tachycardia.  Heart rate ranged from 55-110 bpm.  Occasional PACs and PVCs, every single 1 was felt.  No arrhythmias other than one short 4 beat run of PACs.   COLONOSCOPY  02/04/2005   all normal    FOOT SURGERY     3 pins in toes    HAND SURGERY     left thumb joint resection   LOOP RECORDER INSERTION N/A 08/09/2021   Procedure: LOOP RECORDER INSERTION;  Surgeon: Boyce Byes, MD;  Location: [facility];  Service: Cardiovascular;  Laterality: N/A;   nasal revision     NM MYOVIEW  LTD  07/2017   LOW RISK study.  No ischemia or infarction.  EF greater than 65%.    SINUS IRRIGATION     TONSILLECTOMY AND  ADENOIDECTOMY     TRANSTHORACIC ECHOCARDIOGRAM  06/'1, 8/'19   a) Moderate LVH. Normal EF 60-65%. Normal diastolic parameters. --> Difficult to fully visualize the aortic valve. Cannot exclude bicuspid valve. Mild aortic stenosis noted. No PFO. Mildly dilated left atrium. Trivial MR. No comment on mitral valve prolapse. Moderately dilated ascending aorta.;; b) F/u Echo To evaluate the aortic valve. -- Bicuspid AoV - mildly thickened / calcified. - No stenosis   TRANSTHORACIC ECHOCARDIOGRAM  08/08/2021   EF 60 to 65%.  Normal diastolic parameters?.   ?  Functional bicuspid aortic valve with R&L cusp sommisure.  Very sclerotic.  Stable gradients ~mean gradient 10 mmHg. Very Mild stenosis. Aorta dilated to 42 mm.  Stable.  RAP 3 mmHg   TRANSTHORACIC ECHOCARDIOGRAM  05/16/2022   EF 60 to 65%.  No edema.  Mild LVH.  Moderately Story function.  Mild by atrial enlargement.  Normal RAP for functionally bicuspid aortic valve.Aorta 43 mm.   VIDEO BRONCHOSCOPY WITH ENDOBRONCHIAL NAVIGATION N/A 01/17/2023   Procedure: VIDEO BRONCHOSCOPY WITH ENDOBRONCHIAL NAVIGATION;  Surgeon: Zelphia Higashi, MD;  Location: [facility] OR;  Service: Thoracic;  Laterality: N/A;    Family Psychiatric History:   Family History:  Family History  Problem Relation Age of Onset   Arthritis Mother    Leukemia Father    Heart disease Maternal Uncle    Diabetes Paternal Aunt    Breast cancer Maternal Grandmother    Heart disease Maternal Grandfather    Breast cancer Paternal Grandmother    Colon cancer Neg Hx     Social History:   Social History   Socioeconomic History   Marital status: Married    Spouse name: Not on file   Number of children: 2   Years of education: Not on file   Highest education level: Bachelor's degree (e.g., BA, AB, BS)  Occupational History   Occupation: retired  Tobacco Use   Smoking status: Never    Passive exposure: Never   Smokeless tobacco: Never   Tobacco comments:    Never smoke  04/23/22  Vaping Use   Vaping status: Never Used  Substance and Sexual Activity   Alcohol use: Not Currently    Alcohol/week: 7.0 standard drinks of alcohol    Types: 7 Shots of liquor per week    Comment: none   Drug use: No   Sexual activity: Not Currently  Other Topics Concern   Not on file  Social History Narrative   Right handed    Lives with husband    Occasionally caffeine   retired   Chief Executive Officer Drivers of Corporate investment banker Strain: Low Risk  (  03/15/2024)   Overall Financial Resource Strain (CARDIA)    Difficulty of Paying Living Expenses: Not hard at all  Food Insecurity: No Food Insecurity (03/15/2024)   Hunger Vital Sign    Worried About Running Out of Food in the Last Year: Never true    Ran Out of Food in the Last Year: Never true  Transportation Needs: No Transportation Needs (03/15/2024)   PRAPARE - Administrator, Civil Service (Medical): No    Lack of Transportation (Non-Medical): No  Physical Activity: Inactive (03/15/2024)   Exercise Vital Sign    Days of Exercise per Week: 0 days    Minutes of Exercise per Session: 0 min  Stress: Stress Concern Present (03/15/2024)   [employer] of Occupational Health - Occupational Stress Questionnaire    Feeling of Stress : Very much  Social Connections: Moderately Isolated (03/15/2024)   Social Connection and Isolation Panel    Frequency of Communication with Friends and Family: Three times a week    Frequency of Social Gatherings with Friends and Family: Once a week    Attends Religious Services: Never    Database administrator or Organizations: No    Attends Banker Meetings: Never    Marital Status: Married    Additional Social History:   Allergies:   Allergies  Allergen Reactions   Crestor  [Rosuvastatin ] Anaphylaxis, Hives, Shortness Of Breath, Itching, Photosensitivity, Nausea And Vomiting, Anxiety, Palpitations and Other (See Comments)   Duragesic -100 [Fentanyl ] Shortness  Of Breath and Rash   Pacerone  [Amiodarone ] Nausea Only and Other (See Comments)    Ataxia  Weakness  Chills Dyspnea  Near syncope Chest pain   Tikosyn  [Dofetilide ] Other (See Comments)    Torsade and cardiac arrest during dofetilide  initiation   Apresoline  [Hydralazine ] Other (See Comments)    Weakness  Sweats Skin redness   Hctz [Hydrochlorothiazide ] Other (See Comments)    Hyponatremia   Oxycontin  [Oxycodone ] Other (See Comments)    Unknown reaction   Amoxil  [Amoxicillin ] Diarrhea, Swelling and Rash    Rash to vaginal area, including swelling    Codeine Other (See Comments)    Hyperactivity    Conjugated Estrogens Itching and Rash   Erythromycin Rash    Reaction to E-mycin Has done ok with other medications in this class   Feldene [Piroxicam] Itching and Rash    Reaction to brand name Feldene    Metabolic Disorder Labs: Lab Results  Component Value Date   HGBA1C 5.1 07/18/2023   MPG 99.67 08/07/2021   No results found for: PROLACTIN Lab Results  Component Value Date   CHOL 178 07/18/2023   TRIG 112.0 07/18/2023   HDL 90.70 07/18/2023   CHOLHDL 2 07/18/2023   VLDL 22.4 07/18/2023   LDLCALC 65 07/18/2023   LDLCALC 107 (H) 08/20/2022   Lab Results  Component Value Date   TSH 1.86 07/18/2023    Therapeutic Level Labs: No results found for: LITHIUM No results found for: CBMZ No results found for: VALPROATE  Current Medications: Current Outpatient Medications  Medication Sig Dispense Refill   divalproex  (DEPAKOTE ) 250 MG DR tablet Take 1 tablet (250 mg total) by mouth 2 (two) times daily. 180 tablet 3   Eszopiclone  3 MG TABS Take 1 tablet (3 mg total) by mouth at bedtime. 90 tablet 1   levothyroxine  (SYNTHROID ) 125 MCG tablet TAKE 1 TABLET(125 MCG) BY MOUTH DAILY 90 tablet 1   Multiple Vitamin (MULTIVITAMIN WITH MINERALS) TABS tablet Take 1 tablet by mouth  daily.     nebivolol  (BYSTOLIC ) 10 MG tablet TAKE 1 TABLET(10 MG) BY MOUTH DAILY AT 8 PM 90  tablet 3   albuterol  (VENTOLIN  HFA) 108 (90 Base) MCG/ACT inhaler INHALE 2 PUFFS INTO THE LUNGS EVERY 6 HOURS AS NEEDED FOR WHEEZING OR SHORTNESS OF BREATH (Patient not taking: Reported on 04/14/2024) 6.7 g 0   ALPRAZolam  (XANAX ) 0.5 MG tablet Take 1 tablet (0.5 mg total) by mouth 2 (two) times daily as needed for anxiety. 60 tablet 3   brexpiprazole  (REXULTI ) 2 MG TABS tablet Take 1 tablet every evening 30 tablet 5   cloNIDine  (CATAPRES ) 0.2 MG tablet Take 0.1 mg by mouth 2 (two) times daily as needed (systolic over 160). if blood pressure is great than 160 systolic for more than an hour (Patient not taking: Reported on 04/14/2024) 30 tablet 6   FLUoxetine  (PROZAC ) 40 MG capsule 1 qam 30 capsule 5   furosemide  (LASIX ) 20 MG tablet Take 1 tablet (20 mg total) by mouth daily as needed. For feet edema that doesn't resolve with elevation and wearing support hose. 60 tablet 3   HYDROcodone -acetaminophen  (NORCO/VICODIN) 5-325 MG tablet Take 1 tablet by mouth daily as needed for moderate pain. 20 tablet 0   hydrOXYzine  (VISTARIL ) 25 MG capsule Take 1 capsule (25 mg total) by mouth every 8 (eight) hours as needed. 90 capsule 5   latanoprost  (XALATAN ) 0.005 % ophthalmic solution Place 1 drop into both eyes at bedtime.     losartan  (COZAAR ) 100 MG tablet Take 1 tablet (100 mg total) by mouth daily. 90 tablet 2   magnesium  oxide (MAG-OX) 400 MG tablet Take 1 tablet (400 mg total) by mouth 2 (two) times daily. (Patient taking differently: Take 400 mg by mouth daily.) 60 tablet 0   montelukast  (SINGULAIR ) 10 MG tablet TAKE 1 TABLET(10 MG) BY MOUTH AT BEDTIME 90 tablet 3   naproxen  sodium (ALEVE ) 220 MG tablet Take 440 mg by mouth daily as needed (pain).     Oxcarbazepine  (TRILEPTAL ) 300 MG tablet Take 1 tablet twice a day 60 tablet 6   Respiratory Therapy Supplies (FLUTTER) DEVI Use as directed 1 each 0   triamcinolone  cream (KENALOG ) 0.1 % Apply 1 Application topically 2 (two) times daily. (Patient taking  differently: Apply 1 Application topically 2 (two) times daily as needed (irritation).) 100 g 0   No current facility-administered medications for this visit.    Musculoskeletal: Strength & Muscle Tone: decreased Gait & Station: unsteady Patient leans:   Psychiatric Specialty Exam: Review of Systems  There were no vitals taken for this visit.There is no height or weight on file to calculate BMI.  General Appearance: Casual  Eye Contact:  Fair  Speech:  NA  Volume:    Mood:  Depressed  Affect:  Blunt  Thought Process:  Disorganized  Orientation:  NA  Thought Content:  WDL  Suicidal Thoughts:  No  Homicidal Thoughts:  No  Memory:  NA  Judgement:  Poor  Insight:  Lacking  Psychomotor Activity:  Decreased  Concentration:    Recall:  Poor  Fund of Knowledge:Poor  Language: Fair  Akathisia:  No  Handed:  Right  AIMS (if indicated):  not done  Assets:  Desire for Improvement  ADL's:  Impaired  Cognition:   Sleep:  Good   Screenings: AUDIT    Flowsheet Row Appointment from 02/21/2023 in [facility] Coordination from 08/12/2022 in [facility] Care Coordination  Alcohol  Use Disorder Identification Test Final Score (AUDIT) 3 4   Mini-Mental    Flowsheet Row Clinical Support from 07/24/2018 in [facility] Clinical Support from 07/14/2017 in [facility]  Total Score (max 30 points ) 27 28   PHQ2-9    Flowsheet Row Clinical Support from 08/13/2023 in [facility] Video Visit from 02/28/2023 in [facility] Visit from 11/12/2022 in [facility] Visit from 05/30/2022 in [facility] Clinical Support from 02/22/2022 in [facility]  PHQ-2 Total Score 6 0 0 2 0  PHQ-9 Total Score 12 0 0 3 --    Flowsheet Row ED from 04/16/2023 in [facility] Emergency Department at [facility] ED to Hosp-Admission (Discharged) from 02/11/2023 in [place] [facility] Admission (Discharged) from 01/17/2023 in  PERIOPERATIVE AREA  C-SSRS RISK CATEGORY No Risk No Risk No Risk    Assessment and Plan:   This patient is experiencing Alzheimer's dementia with agitation.  Technically this would be behavioral disturbance but is also associated with depression.  The patient had significant physical agitation which is much improved on Rexulti .  She takes Depakote  which might have some effect on her patient but she says she is taking that for a seizure disorder.  Patient has a past history of depression and continues taking Prozac .  Today we will just from 20 mg twice daily to taking a 40 mg pill in the morning.  Today we had a long discussion about the benefits of staying on versus being in a facility.  For now the family is willing to keep her at home even though the husband at a has to help a great deal.  Today we get any a support group in the [place] community to help him deal with taking care of other demented spouse.  I would describe her level of dementia has been moderate to severe.  She still recognizes her family and can speak to me and answer questions fairly well.  However her language abilities are clearly declining.  Her husband says that over the last 6 months she has become more withdrawn and more fragile.  This patient will be seen again in 2-1/2 months.  Collaboration of Care:   Patient/Guardian was advised Release of Information must be obtained prior to any record release in order to collaborate their care with an outside provider. Patient/Guardian was advised if they have not already done so to contact the registration department to sign all necessary forms in order for us  to release information regarding their care.   Consent: Patient/Guardian gives verbal consent for  treatment and assignment of benefits for services provided during this visit. Patient/Guardian expressed understanding and agreed to proceed.   Delorse Fey, MD 6/18/20253:04 PM

## 2024-05-05 ENCOUNTER — Telehealth: Payer: Self-pay | Admitting: Internal Medicine

## 2024-05-05 DIAGNOSIS — F03918 Unspecified dementia, unspecified severity, with other behavioral disturbance: Secondary | ICD-10-CM

## 2024-05-05 NOTE — Telephone Encounter (Signed)
 Copied from CRM [id number]. Topic: Clinical - Medical Advice >> May 05, 2024  2:34 PM Montie POUR wrote: Reason for CRM:  Marissa [name] needs more caregivers at home. They called [employer] and her insurance will pay for 35 hours per week for caregivers in the home. They are paying for a caregiver now. Please send an order for caregivers to come into the home for 35 hours weekly.  Please send to [facility]. Their number is [phone].  Please call Eddie with questions at [phone].

## 2024-05-07 NOTE — Telephone Encounter (Signed)
 Order placed it will need to be faxed to number listed.

## 2024-05-10 NOTE — Telephone Encounter (Signed)
 Copied from CRM [id number]. Topic: Clinical - Medical Advice >> May 10, 2024  2:09 PM Robinson H wrote: Amy patients caregiver following up on prescription that needed to be sent to provide patient with in home health care, please update with status, thanks.  Marissa [name] [phone]

## 2024-05-11 NOTE — Telephone Encounter (Signed)
 I need a fax number the numbers listed are not fax numbers

## 2024-05-12 NOTE — Telephone Encounter (Signed)
 Copied from CRM [id number]. Topic: Referral - Status >> May 12, 2024  1:23 PM Aleatha C wrote: Reason for CRM: Home health aid of patient is calling to check on status of paperwork for [employer] for the 35 hours per week of care, here is the phone # [phone] and provider line [phone] to call

## 2024-05-13 ENCOUNTER — Ambulatory Visit
Admission: EM | Admit: 2024-05-13 | Discharge: 2024-05-13 | Disposition: A | Discharge status: Home / Self Care | Attending: Nurse Practitioner | Admitting: Nurse Practitioner

## 2024-05-13 ENCOUNTER — Ambulatory Visit (INDEPENDENT_AMBULATORY_CARE_PROVIDER_SITE_OTHER)

## 2024-05-13 DIAGNOSIS — W19XXXA Unspecified fall, initial encounter: Secondary | ICD-10-CM

## 2024-05-13 DIAGNOSIS — M898X1 Other specified disorders of bone, shoulder: Secondary | ICD-10-CM

## 2024-05-13 DIAGNOSIS — Y92009 Unspecified place in unspecified non-institutional (private) residence as the place of occurrence of the external cause: Secondary | ICD-10-CM

## 2024-05-13 HISTORY — DX: Unspecified dementia, unspecified severity, without behavioral disturbance, psychotic disturbance, mood disturbance, and anxiety: F03.90

## 2024-05-13 NOTE — Discharge Instructions (Signed)
 Ms. Marissa [name] was evaluated today after a fall yesterday. Fortunately, there is no evidence of a new fracture or dislocation on today's X-ray. A chronic, previously known fracture in the left clavicle remains unchanged and is not a cause for concern at this time. There were no signs of acute injury or head trauma.  At home, continue to closely supervise your loved one, especially during walking or standing, as she is at increased risk for falling due to dementia-related balance issues and wandering. Provide assistance with mobility as needed and consider using mobility aids if recommended. Remove tripping hazards from the home, such as loose rugs or clutter, and ensure adequate lighting in walking areas. Encourage frequent hydration and regular bathroom visits to reduce urgency-related falls.  Monitor for any signs of new or worsening symptoms such as increased pain, swelling, or bruising over the shoulder or back; changes in walking ability; confusion; dizziness; or any head injury signs such as vomiting, loss of balance, or altered mental status. Contact her primary care provider if any of these symptoms develop or if you have concerns about her ability to move safely. Seek immediate emergency care if she experiences a head injury, sudden weakness, inability to walk, or severe pain following another fall.

## 2024-05-13 NOTE — ED Provider Notes (Signed)
 [facility]    CSN: [id number] Arrival date & time: 05/13/24  1411      History   Chief Complaint Chief Complaint  Patient presents with   Fall    HPI Marissa [name] is a 78 y.o. female.   Discussed the use of AI scribe software for clinical note transcription with the patient, who gave verbal consent to proceed.   Patient presents with caregiver   History mainly provided by caregiver and brief review of recent health records   Patient has history of hypertension, hyperlipidemia, mitral prolapse, bicuspid aortic valve, mild aortic stenosis, ascending aneurysm, atrial fibrillation, V-fib arrest, strokes, multiple falls, arthritis, ulcerative colitis, chronic pain, a right upper lobe groundglass opacity and dementia. Review of recent medical records state that the patient has had her medications adjusted with neurology for issues with sundowning. There has also been some safety concerns within her home environment regarding her husband's ability to effectively aide with her care. She has daytime caregivers but not any additional assistance during the nights.   Caregiver reports concerns that the patient is experiencing pain in her upper back, specifically on the right side. The caregiver noticed something sticking out on the upper back today. Caregiver says that the patient's balance is poor, and she sometimes wanders due to her dementia. The fall occurred yesterday when Elijah was with her husband who told the caregiver that the patient fell forward while standing. Loss of consciousness did not occur. There is not any obvious wounds or bruising. Patient is not on any blood thinners. Caregiver hasn't noticed any changes from her baseline.   The following portions of the patient's history were reviewed and updated as appropriate: allergies, current medications, past family history, past medical history, past social history, past surgical history, and problem  list.     Past Medical History:  Diagnosis Date   Anxiety    Arthritis    Asthma    Bicuspid aortic valve 05/2017   Likely functional bicuspid aortic valve with sclerosis and no stenosis.   Bursitis    Cardiac arrest (HCC)    x 2 (within an hour) June 2023   Cataract    mild   Cognitive change    Dementia (HCC)    Dyspnea    occasional   Dysrhythmia    A-fib   Dysthymia    GERD (gastroesophageal reflux disease)    Hemorrhoids    Hx of ulcerative colitis    per dr mavis as per pt.   Hyperlipidemia    Hypothyroidism    Labile hypertension    managed - labile.   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 difficile diarrhea 05/21/2022   HTN (hypertension) 05/21/2022   Drug-induced torsades de pointes (HCC) 04/24/2022   Hx of cardiac arrest 04/24/2022   Ataxia 03/04/2022   Aortic atherosclerosis (HCC)  12/29/2021   Costochondritis 12/26/2021   Persistent atrial fibrillation (HCC): CHA2DS2-VASc Score = 7  08/31/2021   Solitary pulmonary nodule 08/17/2021   Chronic cough 06/01/2021   Memory loss 07/15/2020   Depressive disorder 09/06/2019   Overactive bladder 09/06/2019   Anxiety    Prolonged QT interval 12/21/2018   Dyslipidemia 10/02/2018   Low back pain 12/31/2017   Pain of left hip joint 12/31/2017   Encounter for chronic pain management 11/28/2017   Dyspnea on exertion 06/01/2017   Mild aortic stenosis 05/27/2017   Routine  general medical examination at a health care facility 07/09/2016   Frequent falls 03/28/2016   Rosacea 08/18/2015   Lumbar radiculopathy 07/07/2015   Urinary incontinence 06/20/2015   Hemorrhoids 10/06/2014   Labile hypertension 02/22/2014   Insomnia secondary to chronic pain 07/09/2012   B12 deficiency 04/03/2011   Vitamin D  deficiency 07/25/2010   ANEMIA, IRON DEFICIENCY 04/25/2009   Allergic rhinitis 02/01/2009   Mild dilation of ascending aorta (HCC) 12/27/2008   Chronic fatigue syndrome 04/21/2008   Osteoarthritis 01/19/2008   Hypothyroidism 03/13/2007   Asthma 03/13/2007   Fibromyalgia 03/13/2007    Past Surgical History:  Procedure Laterality Date   ABDOMINAL HYSTERECTOMY     BREAST EXCISIONAL BIOPSY Left    BUNIONECTOMY     Cardiac Event Monitor  07/2017   Overall relatively normal.  Normal sinus rhythm with rare bradycardia and tachycardia.  Heart rate ranged from 55-110 bpm.  Occasional PACs and PVCs, every single 1 was felt.  No arrhythmias other than one short 4 beat run of PACs.   COLONOSCOPY  02/04/2005   all normal    FOOT SURGERY     3 pins in toes    HAND SURGERY     left thumb joint resection   LOOP RECORDER INSERTION N/A 08/09/2021   Procedure: LOOP RECORDER INSERTION;  Surgeon: Cindie Ole DASEN, MD;  Location: [facility];  Service: Cardiovascular;  Laterality: N/A;   nasal revision     NM MYOVIEW  LTD  07/2017   LOW RISK study.  No ischemia or infarction.  EF greater than 65%.    SINUS IRRIGATION     TONSILLECTOMY AND ADENOIDECTOMY     TRANSTHORACIC ECHOCARDIOGRAM  06/'1, 8/'19   a) Moderate LVH. Normal EF 60-65%. Normal diastolic parameters. --> Difficult to fully visualize the aortic valve. Cannot exclude bicuspid valve. Mild aortic stenosis noted. No PFO. Mildly dilated left atrium. Trivial MR. No comment on mitral valve prolapse. Moderately dilated ascending aorta.;; b) F/u Echo To evaluate the aortic valve. -- Bicuspid AoV - mildly thickened  / calcified. - No stenosis   TRANSTHORACIC ECHOCARDIOGRAM  08/08/2021   EF 60 to 65%.  Normal diastolic parameters?.   ?  Functional bicuspid aortic valve with R&L cusp sommisure.  Very sclerotic.  Stable gradients ~mean gradient 10 mmHg. Very Mild stenosis. Aorta dilated to 42 mm.  Stable.  RAP 3 mmHg   TRANSTHORACIC ECHOCARDIOGRAM  05/16/2022   EF 60 to 65%.  No edema.  Mild LVH.  Moderately Story function.  Mild by atrial enlargement.  Normal RAP for functionally bicuspid aortic valve.Aorta 43 mm.   VIDEO BRONCHOSCOPY WITH ENDOBRONCHIAL NAVIGATION N/A 01/17/2023   Procedure: VIDEO BRONCHOSCOPY WITH ENDOBRONCHIAL NAVIGATION;  Surgeon: Kerrin Elspeth BROCKS, MD;  Location: [facility] OR;  Service: Thoracic;  Laterality: N/A;    OB History   No obstetric history on file.      Home Medications    Prior to Admission medications  Medication Sig Start Date End Date Taking? Authorizing Provider  albuterol  (VENTOLIN  HFA) 108 (90 Base) MCG/ACT inhaler INHALE 2 PUFFS INTO THE LUNGS EVERY 6 HOURS AS NEEDED FOR WHEEZING OR SHORTNESS OF BREATH Patient not taking: Reported on 04/14/2024 10/23/23   Rollene Almarie LABOR, MD  ALPRAZolam  (XANAX ) 0.5 MG tablet Take 1 tablet (0.5 mg total) by mouth 2 (two) times daily as needed for anxiety. 11/26/22   Rollene Almarie LABOR, MD  brexpiprazole  (REXULTI ) 2 MG TABS tablet Take 1 tablet every evening 04/14/24   Plovsky, Elna, MD  cloNIDine  (CATAPRES ) 0.2 MG tablet Take 0.1 mg by mouth 2 (two) times daily as needed (systolic over 160). if blood pressure is great than 160 systolic for more than an hour Patient not taking: Reported on 04/14/2024 12/17/22   Anner Alm ORN, MD  divalproex  (DEPAKOTE ) 250 MG DR tablet Take 1 tablet (250 mg total) by mouth 2 (two) times daily. 03/05/24   Wertman, Sara E, PA-C  Eszopiclone  3 MG TABS Take 1 tablet (3 mg total) by mouth at bedtime. 01/26/24   Rollene Almarie LABOR, MD  FLUoxetine  (PROZAC ) 40 MG capsule 1 qam 04/14/24   Plovsky,  Gerald, MD  furosemide  (LASIX ) 20 MG tablet Take 1 tablet (20 mg total) by mouth daily as needed. For feet edema that doesn't resolve with elevation and wearing support hose. 09/16/23 12/15/23  Anner Alm ORN, MD  HYDROcodone -acetaminophen  (NORCO/VICODIN) 5-325 MG tablet Take 1 tablet by mouth daily as needed for moderate pain. 05/07/23   Crain, Whitney L, PA  hydrOXYzine  (VISTARIL ) 25 MG capsule Take 1 capsule (25 mg total) by mouth every 8 (eight) hours as needed. 08/05/23   Rollene Almarie LABOR, MD  latanoprost  (XALATAN ) 0.005 % ophthalmic solution Place 1 drop into both eyes at bedtime. 08/16/22   [provider]  levothyroxine  (SYNTHROID ) 125 MCG tablet TAKE 1 TABLET(125 MCG) BY MOUTH DAILY 03/10/24   Rollene Almarie LABOR, MD  losartan  (COZAAR ) 100 MG tablet Take 1 tablet (100 mg total) by mouth daily. 02/04/24   Anner Alm ORN, MD  magnesium  oxide (MAG-OX) 400 MG tablet Take 1 tablet (400 mg total) by mouth 2 (two) times daily. Patient taking differently: Take 400 mg by mouth daily. 05/15/22   Rollene Almarie LABOR, MD  montelukast  (SINGULAIR ) 10 MG tablet TAKE 1 TABLET(10 MG) BY MOUTH AT BEDTIME 03/10/24   Rollene Almarie LABOR, MD  Multiple Vitamin (MULTIVITAMIN WITH MINERALS) TABS tablet Take 1 tablet by mouth daily. 05/24/22   Hongalgi, Anand D, MD  naproxen  sodium (ALEVE ) 220 MG tablet Take 440 mg by mouth daily as needed (pain).    [provider]  nebivolol  (BYSTOLIC ) 10 MG tablet TAKE 1 TABLET(10 MG) BY MOUTH DAILY AT 8 PM 09/18/23   Anner Alm ORN, MD  Oxcarbazepine  (TRILEPTAL ) 300 MG tablet Take 1 tablet twice a day 06/13/23   Georjean Darice HERO, MD  Respiratory Therapy Supplies (FLUTTER) DEVI Use as directed 11/18/16   Darlean Ozell NOVAK, MD  triamcinolone  cream (KENALOG ) 0.1 % Apply 1 Application topically 2 (two) times daily. Patient taking differently: Apply 1 Application topically 2 (two) times daily as needed (irritation). 06/11/22   Rollene Almarie LABOR, MD     Family History Family History  Problem Relation Age of Onset   Arthritis Mother    Leukemia Father    Heart disease Maternal Uncle    Diabetes Paternal Aunt    Breast cancer Maternal Grandmother    Heart disease Maternal Grandfather    Breast  cancer Paternal Grandmother    Colon cancer Neg Hx     Social History Social History   Tobacco Use   Smoking status: Never    Passive exposure: Never   Smokeless tobacco: Never   Tobacco comments:    Never smoke 04/23/22  Vaping Use   Vaping status: Never Used  Substance Use Topics   Alcohol use: Not Currently    Alcohol/week: 7.0 standard drinks of alcohol    Types: 7 Shots of liquor per week    Comment: none   Drug use: No     Allergies   Crestor  [rosuvastatin ], Duragesic -100 [fentanyl ], Pacerone  [amiodarone ], Tikosyn  [dofetilide ], Apresoline  [hydralazine ], Hctz [hydrochlorothiazide ], Oxycontin  [oxycodone ], Amoxil  [amoxicillin ], Codeine, Conjugated estrogens, Erythromycin, and Feldene [piroxicam]   Review of Systems Review of Systems  Musculoskeletal:  Positive for gait problem (unsteady gait).  Skin:  Negative for color change, rash and wound.  Psychiatric/Behavioral:         Has history of dementia       Physical Exam Triage Vital Signs ED Triage Vitals  Encounter Vitals Group     BP 05/13/24 1423 121/76     Girls Systolic BP Percentile --      Girls Diastolic BP Percentile --      Boys Systolic BP Percentile --      Boys Diastolic BP Percentile --      Pulse Rate 05/13/24 1423 63     Resp 05/13/24 1423 18     Temp 05/13/24 1423 (!) 97.5 F (36.4 C)     Temp Source 05/13/24 1423 Oral     SpO2 05/13/24 1423 98 %     Weight --      Height --      Head Circumference --      Peak Flow --      Pain Score 05/13/24 1421 8     Pain Loc --      Pain Education --      Exclude from Growth Chart --    No data found.  Updated Vital Signs BP 121/76 (BP Location: Left Arm)   Pulse 63   Temp (!) 97.5 F (36.4  C) (Oral)   Resp 18   SpO2 98%   Visual Acuity Right Eye Distance:   Left Eye Distance:   Bilateral Distance:    Right Eye Near:   Left Eye Near:    Bilateral Near:     Physical Exam Vitals reviewed.  Constitutional:      General: She is awake. She is not in acute distress.    Appearance: Normal appearance. She is well-developed and well-groomed. She is not ill-appearing, toxic-appearing or diaphoretic.  HENT:     Head: Normocephalic and atraumatic.     Right Ear: Hearing normal.     Left Ear: Hearing normal.     Nose: Nose normal.     Mouth/Throat:     Mouth: Mucous membranes are moist.  Eyes:     General: Vision grossly intact.     Conjunctiva/sclera: Conjunctivae normal.  Cardiovascular:     Rate and Rhythm: Normal rate and regular rhythm.     Heart sounds: Normal heart sounds.  Pulmonary:     Effort: Pulmonary effort is normal.     Breath sounds: Normal breath sounds and air entry.  Chest:     Chest wall: No deformity, swelling or tenderness.  Abdominal:     Palpations: Abdomen is soft.  Musculoskeletal:        General:  Normal range of motion.       Arms:     Cervical back: Full passive range of motion without pain, normal range of motion and neck supple.  Skin:    General: Skin is warm and dry.     Findings: No abrasion, bruising, erythema, signs of injury or wound.  Neurological:     General: No focal deficit present.     Mental Status: She is alert. Mental status is at baseline.     Sensory: Sensation is intact.     Motor: Motor function is intact.     Coordination: Coordination is intact.     Comments: Unsteady gait. Requires 1 person assistance.   Psychiatric:        Speech: Speech normal.        Behavior: Behavior is cooperative.      UC Treatments / Results  Labs (all labs ordered are listed, but only abnormal results are displayed) Labs Reviewed - No data to display  EKG   Radiology DG Scapula Right Result Date: 05/13/2024 CLINICAL  DATA:  fall 1 day ago; pain and some swelling EXAM: RIGHT SCAPULA - 2+ VIEWS COMPARISON:  None Available. FINDINGS: No acute fracture or dislocation. Unchanged, chronic fracture of the distal left clavicle without union. Soft tissues are unremarkable. Multilevel thoracic osteophytosis. IMPRESSION: No acute fracture or dislocation. Electronically Signed   By: Rogelia Myers M.D.   On: 05/13/2024 15:55    Procedures Procedures (including critical care time)  Medications Ordered in UC Medications - No data to display  Initial Impression / Assessment and Plan / UC Course  I have reviewed the triage vital signs and the nursing notes.  Pertinent labs & imaging results that were available during my care of the patient were reviewed by me and considered in my medical decision making (see chart for details).     78 year old female with dementia presents for evaluation following a fall yesterday. According to her caregiver, the patient fell forward while standing, and the event was witnessed by her husband. There was no loss of consciousness, and no visible bruising, deformity, or acute injury was observed on physical examination despite the forward impact. The patient has a known history of poor balance and wandering behaviors related to her dementia. She is not currently on any blood thinners.  X-ray of the right scapula revealed no acute fracture or dislocation. 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 Patient and caregiver were educated on fall prevention strategies, including creating a safe home environment and reducing fall hazards. Close supervision and assistance with ambulation were recommended due to the patient's balance issues and history of wandering. Advised to follow up with the primary care provider as needed for ongoing fall risk management and support.  Today's evaluation has revealed no signs of a dangerous process. Discussed diagnosis with  patient and/or guardian. Patient and/or guardian aware of their diagnosis, possible red flag symptoms to watch out for and need for close follow up. Patient and/or guardian understands verbal and written discharge instructions. Patient and/or guardian comfortable with plan and disposition.  Patient and/or guardian has a clear mental status at this time, good insight into illness (after discussion and teaching) and has clear judgment to make decisions regarding their care  Documentation was completed with the aid of voice recognition software. Transcription may contain typographical errors. Final Clinical Impressions(s) / UC Diagnoses   Final diagnoses:  Pain of right scapula  Fall in home, initial  encounter     Discharge Instructions      Ms. Amelianna was evaluated today after a fall yesterday. Fortunately, there is no evidence of a new fracture or dislocation on today's X-ray. A chronic, previously known fracture in the left clavicle remains unchanged and is not a cause for concern at this time. There were no signs of acute injury or head trauma.  At home, continue to closely supervise your loved one, especially during walking or standing, as she is at increased risk for falling due to dementia-related balance issues and wandering. Provide assistance with mobility as needed and consider using mobility aids if recommended. Remove tripping hazards from the home, such as loose rugs or clutter, and ensure adequate lighting in walking areas. Encourage frequent hydration and regular bathroom visits to reduce urgency-related falls.  Monitor for any signs of new or worsening symptoms such as increased pain, swelling, or bruising over the shoulder or back; changes in walking ability; confusion; dizziness; or any head injury signs such as vomiting, loss of balance, or altered mental status. Contact her primary care provider if any of these symptoms develop or if you have concerns about her ability to move  safely. Seek immediate emergency care if she experiences a head injury, sudden weakness, inability to walk, or severe pain following another fall.      ED Prescriptions   None    PDMP not reviewed this encounter.   Iola Lukes, [place] 05/13/24 [phone]

## 2024-05-13 NOTE — ED Triage Notes (Signed)
 Pt present with caregiver.   Caregiver states the pt had a fall yesterday. Today, she has lt side back and neck pain. Caregiver reports she did not have any pain yesterday until today.

## 2024-05-14 NOTE — Telephone Encounter (Unsigned)
 Copied from CRM [id number]. Topic: Referral - Status >> May 12, 2024  1:23 PM Aleatha C wrote: Reason for CRM: Home health aid of patient is calling to check on status of paperwork for [employer] for the 35 hours per week of care, here is the phone # [phone] and provider line [phone] to call >> May 14, 2024 11:08 AM Rosina BIRCH wrote: Home health aid (Amy) called stating she has the fax number for home health service for the 35 hours per week of care [phone] CB for [phone]

## 2024-05-14 NOTE — Telephone Encounter (Signed)
 I Just sent the fax over and waiting on confirmation

## 2024-05-18 NOTE — Telephone Encounter (Signed)
 Confirmation was confirmed this has been sent over

## 2024-05-21 NOTE — Telephone Encounter (Signed)
 Form has been faxed again. Confirmation received

## 2024-05-21 NOTE — Telephone Encounter (Signed)
 Patient caregiver called back again stating she confirmed with [employer] that they have not received any documents for requested additional hours for care. Per CAL information was in hand and can be re-faxed. Fax number provided [phone] and contact number to speak with someone if needed. [phone]. Patient would also like to request a specific home health provider([facility]- [phone]. Home Aid-Amy Mabe

## 2024-05-21 NOTE — Telephone Encounter (Signed)
 Amy Mabe-caregiver called to check on the status of paperwork that needed to be submitted and was advised per chart notation that information has been faxed to [facility] and confirmation of receipt was provided.

## 2024-05-24 NOTE — Telephone Encounter (Signed)
 This was already placed on 05/07/24

## 2024-05-25 NOTE — Telephone Encounter (Unsigned)
 Copied from CRM [id number]. Topic: General - Other >> May 24, 2024  3:46 PM Macario HERO wrote: Reason for CRM: Marissa [name] who is the patient caregiver is calling to check the status of the paperwork because patient needs more caregivers. Marissa [name] said [employer] never received the form. -- Marissa [name] also states for provider or her nurse to give patient spouse Marissa [name] a call. >> May 25, 2024  4:30 PM Marissa [name] wrote: Marissa [name] is calling in stating that [employer] has not received the forms yet. She wanted to provide the numbers again incase they were wrong before, the telephone number is [phone] Fax number [phone], she provided another number for [facility] of [phone] but is not sure if it a fax or phone number.  Please advise Marissa [name] at [phone], if any questions or issues.

## 2024-05-25 NOTE — Telephone Encounter (Signed)
 Spoke to amy, she states [employer] has not received the forms yet.   I let her know that I would fax the forms again and mail a copy to the pt's home.

## 2024-05-25 NOTE — Telephone Encounter (Unsigned)
 Copied from CRM [id number]. Topic: General - Other >> May 24, 2024  3:46 PM Macario HERO wrote: Reason for CRM: Amy who is the patient caregiver is calling to check the status of the paperwork because patient needs more caregivers. Amy said [employer] never received the form. -- Amy also states for provider or her nurse to give patient spouse Lytle a call.

## 2024-05-26 NOTE — Telephone Encounter (Signed)
 Copied from CRM [id number]. Topic: General - Other >> May 25, 2024 12:42 PM Pinkey ORN wrote: Reason for CRM: Documents >> May 26, 2024  1:04 PM Robinson H wrote: Marissa with [facility] [employer] following up, states request for home health service was received but hasn't received an order or any recent visit notes on file, please reach out information below is direct number to Marissa with a confidential voicemail.  Marissa [name] Medicare [phone] Direct number Plan fax number [phone] Attn: Marissa >> May 25, 2024 12:44 PM Pinkey ORN wrote: Patient's caregiver Amy called on behalf of the patient, states that the requested documentation has yet to be received. I've read the notes back, advising that it's been numerous of times and she proceeded to request speaking with someone in office.

## 2024-05-27 NOTE — Telephone Encounter (Unsigned)
 Copied from CRM #[id number]. Topic: General - Other >> May 27, 2024 10:04 AM Deleta RAMAN wrote: Reason for CRM: Alfonso from [employer] is calling about home health orders been sent over regarding the patient. She would like to follow up with the patient's pcp please contact at [phone]. Leave voice message if needed.

## 2024-05-27 NOTE — Telephone Encounter (Signed)
 Did you call back as requested to get more info?

## 2024-05-27 NOTE — Telephone Encounter (Signed)
 I have fax this back over along with recent paperwork

## 2024-05-27 NOTE — Telephone Encounter (Signed)
 Yes we have received and I have fax back to them the recent office notes and the referral for home health

## 2024-05-27 NOTE — Telephone Encounter (Signed)
 Copied from CRM [id number]. Topic: General - Other >> May 27, 2024 10:39 AM Jasmin G wrote: Reason for CRM: Caregiver Amy called to see if clinic received some info from insurance on extra time on home care. Please call her back at [phone]

## 2024-05-28 NOTE — Telephone Encounter (Signed)
 Called and LVM to call back with more information

## 2024-06-01 NOTE — Telephone Encounter (Unsigned)
 Copied from CRM [id number]. Topic: General - Other >> Jun 01, 2024  1:08 PM Suzen RAMAN wrote: Reason for CRM: Patient caregiver was informed by insurance that the request for additional home health hours(allowed 35 hours) were denied due to the CPT codes listed. Caregiver was advised that if the provider reaches out to the insurance for approval and changes code to deem the additional hours medically necessity. Patient caregiver states patient is now currently having difficulty walking and feeding herself and would like to know if patient would need an appointment to be re-evaluated and if so could it be virtual.    CB#[phone]

## 2024-06-02 NOTE — Telephone Encounter (Signed)
 Did you call andrea from [employer] not the caregiver? We need info from her insurance company not secondhand information to help us  help them.

## 2024-06-02 NOTE — Telephone Encounter (Signed)
 Called Marissa [name] and lvm to give me a call back in regards to the orders being denied in regards to wrong coding

## 2024-06-02 NOTE — Telephone Encounter (Signed)
 There are needing coding change

## 2024-06-03 ENCOUNTER — Other Ambulatory Visit: Payer: Self-pay | Admitting: Internal Medicine

## 2024-06-04 ENCOUNTER — Other Ambulatory Visit: Payer: Self-pay | Admitting: Cardiology

## 2024-06-04 NOTE — Telephone Encounter (Signed)
 Amy Mabe-Caregiver called back to check on the status of HH order code correction.  CB# [phone]

## 2024-06-04 NOTE — Telephone Encounter (Signed)
 Spoke with amy the caregiver and she stated that we did not give enough information on patient health and mobility this is why it was denied. They are needing more medical notes on the patient behalf. Amy mentioned to me that patient is unable to walk, she cannot bath by herself and she is urinating in the bed more often in the mornings. The care is there Mon-Friday and also would like to get a ramp for the patient to install at home to help patient move around.   I have called andrea again with the call number that was left in the previous message and no answer and I have also left a voicemail. Possibly we could set patient up with a virtual and go from there

## 2024-06-08 NOTE — Telephone Encounter (Signed)
 Ok with virtual visit

## 2024-06-09 ENCOUNTER — Telehealth: Payer: Self-pay

## 2024-06-09 NOTE — Telephone Encounter (Signed)
 Patient has been scheduled for a virtual 8/22 to discuss the issues in regards to the patient

## 2024-06-09 NOTE — Telephone Encounter (Signed)
 Patient has been scheduled for a virtual visit.

## 2024-06-09 NOTE — Telephone Encounter (Signed)
 Copied from CRM [id number]. Topic: General - Other >> Jun 08, 2024  3:41 PM Macario HERO wrote: Reason for CRM: Patient caregiver Amy called requesting a call back from [facility]. Call: [phone]

## 2024-06-14 NOTE — Telephone Encounter (Signed)
 Amy patient care giver is calling with concerns on patient and home health orders with insurance. She is stating she spoke with her insurance on this morning and they are not approving the orders again because they do not have enough information as to why the patients needs home health. They are wanting to know why she needs it, what's stopping her from doing her daily activities, what her condition is, how can this benefit her in everyday living, what can home health do to help her. She is just wanting an extra 35 hours a week because she and her husband doesn't want her going to a home. Amy wanted me to let you know before virtual appointment exactly what their asking for.

## 2024-06-18 ENCOUNTER — Encounter: Payer: Self-pay | Admitting: Internal Medicine

## 2024-06-18 ENCOUNTER — Telehealth: Admitting: Internal Medicine

## 2024-06-18 DIAGNOSIS — F03918 Unspecified dementia, unspecified severity, with other behavioral disturbance: Secondary | ICD-10-CM | POA: Diagnosis not present

## 2024-06-18 NOTE — Progress Notes (Signed)
 Virtual Visit via Video Note  I connected with Marissa [name] on 06/18/24 at  9:20 AM EDT by a video enabled telemedicine application and verified that I am speaking with the correct person using two identifiers.  The patient and the provider were at separate locations throughout the entire encounter. Patient location: home, Provider location: work   I discussed the limitations of evaluation and management by telemedicine and the availability of in person appointments. The patient expressed understanding and agreed to proceed. The patient and the provider were the only parties present for the visit unless noted in HPI below.  History of Present Illness: Discussed the use of AI scribe software for clinical note transcription with the patient, who gave verbal consent to proceed.  History of Present Illness Marissa [name] is a 78 year old female who presents with a significant decline in her overall health and functional abilities. She is accompanied by her caregiver, who provides care during regular working hours.  Over the past few months, she has experienced a notable decline in her health, becoming unable to feed herself and requiring assistance with all activities of daily living. She has frequent episodes of urinary incontinence and has had several falls, though a previous evaluation at a care facility indicated no acute injuries.  Her mobility has significantly decreased; she is unable to walk without assistance and becomes unsteady when attempting to stand. Her caregiver notes that she 'can't really walk' and has been 'walking in one place.'  Her appetite had decreased, but there has been a slight improvement recently. Her caregiver prepares meals to ensure she eats adequately. Despite this, she sleeps excessively and has experienced a major decline in her cognitive function, often forgetting that she is at home.  The caregiver provides care Monday through Friday from 9 AM to 5 PM, with  extended hours to accommodate her needs. Assistance is required with hygiene, as she is unable to shower or brush her teeth independently. There have been instances of incontinence, requiring frequent cleaning and changing.  The caregiver has been in contact with the insurance company to secure additional home care support, as she is currently allowed up to 35 hours of care per week, which has been insufficient given her needs. Attempts to contact hospice services for additional support have not received a response.  Her current medications include vitamin D  supplements. No new respiratory symptoms such as coughing or breathing difficulties are reported.  Observations/Objective: Appearance: normal, breathing appears normal no coughing during visit, casual grooming, in wheelchair, memory does not follow conversation, able to answer direct questions, mental status is awake and alert  Assessment and Plan Assessment & Plan Dementia with behavioral disturbance   Dementia has rapidly declined, resulting in inability to self-feed, frequent falls, incontinence, and confusion. She requires assistance with daily activities. Considering hospice care for additional support. Send clinical notes to insurance for increased home care support. Initiate hospice referral for additional services.  Follow Up Instructions: referral to hospice for significant decline unable to feed self, dress, walk, cannot brush teeth, more memory loss  I discussed the assessment and treatment plan with the patient. The patient was provided an opportunity to ask questions and all were answered. The patient agreed with the plan and demonstrated an understanding of the instructions.   The patient was advised to call back or seek an in-person evaluation if the symptoms worsen or if the condition fails to improve as anticipated.  Marissa DELENA [place], MD

## 2024-06-21 ENCOUNTER — Ambulatory Visit: Payer: Self-pay

## 2024-06-21 NOTE — Telephone Encounter (Signed)
 FYI Only or Action Required?: Action required by provider: update on patient condition.  Patient was last seen in primary care on 06/18/2024 by Rollene Almarie LABOR, MD.  Called Nurse Triage reporting Toe Pain.  Symptoms began today.  Interventions attempted: Other: has been to podiatry, now looks infected.  Symptoms are: gradually worsening.  Triage Disposition: See Physician Within 24 Hours  Patient/caregiver understands and will follow disposition?: Yes    Copied from CRM #[id number]. Topic: Clinical - Red Word Triage >> Jun 21, 2024  3:47 PM Tinnie BROCKS wrote: Red Word that prompted transfer to Nurse Triage: Amy (caregiver) on the line regarding pt's second toe on rt foot. She says it looks infected. Currently putting an antibiotic cream on it. She was requesting a virtual visit because pt is unable to walk and Amy can't get her down the stairs by herself. Reason for Disposition  Looks like a boil, infected sore, or deep ulcer  Answer Assessment - Initial Assessment Questions Patient unable to leave home  1. ONSET: When did the pain start?      today 2. LOCATION: Where is the pain located?   (e.g., around nail, entire toe, at foot joint)      Right second toe 3. PAIN: How bad is the pain?    (Scale 1-10; or mild, moderate, severe)     moderate 4. APPEARANCE: What does the toe look like? (e.g., redness, swelling, bruising, pallor)     Redness to toe and top of foot 5. CAUSE: What do you think is causing the toe pain?     Callous rubbing 6. OTHER SYMPTOMS: Do you have any other symptoms? (e.g., leg pain, rash, fever, numbness)     denies 7. PREGNANCY: Is there any chance you are pregnant? When was your last menstrual period?     N/A  Protocols used: Toe Pain-A-AH

## 2024-06-22 ENCOUNTER — Ambulatory Visit (HOSPITAL_COMMUNITY): Admitting: Psychiatry

## 2024-06-22 ENCOUNTER — Encounter: Payer: Self-pay | Admitting: Emergency Medicine

## 2024-06-22 ENCOUNTER — Telehealth: Admitting: Emergency Medicine

## 2024-06-22 VITALS — BP 119/70 | Ht 64.0 in

## 2024-06-22 DIAGNOSIS — T148XXA Other injury of unspecified body region, initial encounter: Secondary | ICD-10-CM

## 2024-06-22 DIAGNOSIS — L089 Local infection of the skin and subcutaneous tissue, unspecified: Secondary | ICD-10-CM | POA: Diagnosis not present

## 2024-06-22 MED ORDER — DOXYCYCLINE HYCLATE 100 MG PO TABS: 100.0000 mg | ORAL_TABLET | Freq: Two times a day (BID) | ORAL | 0 refills | Status: AC

## 2024-06-22 NOTE — Progress Notes (Signed)
 Telemedicine Encounter- SOAP NOTE Established Patient MyChart video encounter Patient: Home  Provider: Office   Patient present only  This video encounter was conducted with the patient's (or proxy's) verbal consent via video telecommunications: yes/no: Yes Patient was instructed to have this encounter in a suitably private space; and to only have persons present to whom they give permission to participate. In addition, patient identity was confirmed by use of name plus two identifiers (DOB and address).  Chief Complaint  Patient presents with   Toe Injury    Patient wanted be seen for a toe wound that's on the right foot her second toe, she had a callus and its rubbing against her other to. There is swelling with a little redness    Subjective  Marissa [name] is a 78 y.o. established patient.  Visit today complaining of possible wound infection to right second toe History obtained with help of caregiver.  She has been keeping wound clean and applying topical antibiotic however toe is getting more red and swollen. HPI ? 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 difficile diarrhea 05/21/2022   HTN (hypertension) 05/21/2022   Drug-induced torsades de pointes (HCC) 04/24/2022   Hx of cardiac arrest 04/24/2022   Ataxia 03/04/2022   Aortic atherosclerosis (HCC) 12/29/2021   Costochondritis 12/26/2021   Persistent atrial fibrillation (HCC): CHA2DS2-VASc Score = 7  08/31/2021   Solitary pulmonary nodule 08/17/2021   Chronic cough 06/01/2021   Memory loss 07/15/2020   Depressive disorder 09/06/2019   Overactive bladder 09/06/2019   Anxiety    Prolonged QT interval 12/21/2018   Dyslipidemia 10/02/2018   Low back pain 12/31/2017   Pain of left hip joint 12/31/2017   Encounter for chronic pain management 11/28/2017    Dyspnea on exertion 06/01/2017   Mild aortic stenosis 05/27/2017   Routine general medical examination at a health care facility 07/09/2016   Frequent falls 03/28/2016   Rosacea 08/18/2015   Lumbar radiculopathy 07/07/2015   Urinary incontinence 06/20/2015   Hemorrhoids 10/06/2014   Labile hypertension 02/22/2014   Insomnia secondary to chronic pain 07/09/2012   B12 deficiency 04/03/2011   Vitamin D  deficiency 07/25/2010   ANEMIA, IRON DEFICIENCY 04/25/2009   Allergic rhinitis 02/01/2009   Mild dilation of ascending aorta (HCC) 12/27/2008   Chronic fatigue syndrome 04/21/2008   Osteoarthritis 01/19/2008   Hypothyroidism 03/13/2007   Asthma 03/13/2007   Fibromyalgia 03/13/2007   Past Medical History:  Diagnosis Date   Anxiety    Arthritis    Asthma    Bicuspid aortic valve 05/2017   Likely functional bicuspid aortic valve with sclerosis and no stenosis.   Bursitis    Cardiac arrest (HCC)    x 2 (within an hour) June 2023   Cataract    mild   Cognitive change    Dementia (HCC)    Dyspnea    occasional   Dysrhythmia    A-fib   Dysthymia    GERD (gastroesophageal reflux disease)    Hemorrhoids    Hx of ulcerative colitis    per dr mavis as per pt.   Hyperlipidemia    Hypothyroidism    Labile hypertension    managed - labile.   Migraine    Pneumonia    several times   Scoliosis    Slurred speech  temporal lobe area that is not a tumor causes occ slurred speech and inability to communicate/ words will not come out at the correct time   Spinal stenosis    Stroke [facility]) 08/06/2021   Thoracic aortic aneurysm (HCC)    Stable 4.2-4.3 cm (followed by Dr. Kerrin)   Thyroid  disease    Current Outpatient Medications  Medication Sig Dispense Refill   albuterol  (VENTOLIN  HFA) 108 (90 Base) MCG/ACT inhaler INHALE 2 PUFFS INTO THE LUNGS EVERY 6 HOURS AS NEEDED FOR WHEEZING OR SHORTNESS OF BREATH 6.7 g 0   ALPRAZolam  (XANAX ) 0.5 MG tablet Take 1 tablet (0.5 mg  total) by mouth 2 (two) times daily as needed for anxiety. 60 tablet 3   brexpiprazole  (REXULTI ) 2 MG TABS tablet Take 1 tablet every evening 30 tablet 5   cloNIDine  (CATAPRES ) 0.2 MG tablet Take 0.1 mg by mouth 2 (two) times daily as needed (systolic over 160). if blood pressure is great than 160 systolic for more than an hour 30 tablet 6   divalproex  (DEPAKOTE ) 250 MG DR tablet Take 1 tablet (250 mg total) by mouth 2 (two) times daily. 180 tablet 3   doxycycline  (VIBRA -TABS) 100 MG tablet Take 1 tablet (100 mg total) by mouth 2 (two) times daily for 7 days. 14 tablet 0   Eszopiclone  3 MG TABS Take 1 tablet (3 mg total) by mouth at bedtime. 90 tablet 1   FLUoxetine  (PROZAC ) 40 MG capsule 1 qam 30 capsule 5   furosemide  (LASIX ) 20 MG tablet Take 1 tablet (20 mg total) by mouth daily as needed. For feet edema that doesn't resolve with elevation and wearing support hose. 60 tablet 3   HYDROcodone -acetaminophen  (NORCO/VICODIN) 5-325 MG tablet Take 1 tablet by mouth daily as needed for moderate pain. 20 tablet 0   hydrOXYzine  (VISTARIL ) 25 MG capsule Take 1 capsule (25 mg total) by mouth every 8 (eight) hours as needed. 90 capsule 5   latanoprost  (XALATAN ) 0.005 % ophthalmic solution Place 1 drop into both eyes at bedtime.     levothyroxine  (SYNTHROID ) 125 MCG tablet TAKE 1 TABLET(125 MCG) BY MOUTH DAILY 90 tablet 1   losartan  (COZAAR ) 100 MG tablet Take 1 tablet (100 mg total) by mouth daily. 90 tablet 2   magnesium  oxide (MAG-OX) 400 MG tablet Take 1 tablet (400 mg total) by mouth 2 (two) times daily. (Patient taking differently: Take 400 mg by mouth daily.) 60 tablet 0   montelukast  (SINGULAIR ) 10 MG tablet TAKE 1 TABLET(10 MG) BY MOUTH AT BEDTIME 90 tablet 3   Multiple Vitamin (MULTIVITAMIN WITH MINERALS) TABS tablet Take 1 tablet by mouth daily.     naproxen  sodium (ALEVE ) 220 MG tablet Take 440 mg by mouth daily as needed (pain).     nebivolol  (BYSTOLIC ) 10 MG tablet TAKE 1 TABLET(10 MG) BY MOUTH  DAILY AT 8 PM 90 tablet 0   Oxcarbazepine  (TRILEPTAL ) 300 MG tablet Take 1 tablet twice a day 60 tablet 6   Respiratory Therapy Supplies (FLUTTER) DEVI Use as directed 1 each 0   triamcinolone  cream (KENALOG ) 0.1 % Apply 1 Application topically 2 (two) times daily. (Patient taking differently: Apply 1 Application topically 2 (two) times daily as needed (irritation).) 100 g 0   No current facility-administered medications for this visit.   Allergies  Allergen Reactions   Crestor  [Rosuvastatin ] Anaphylaxis, Hives, Shortness Of Breath, Itching, Photosensitivity, Nausea And Vomiting, Anxiety, Palpitations and Other (See Comments)   Duragesic -100 [Fentanyl ] Shortness Of Breath and Rash  Pacerone  [Amiodarone ] Nausea Only and Other (See Comments)    Ataxia  Weakness  Chills Dyspnea  Near syncope Chest pain   Tikosyn  [Dofetilide ] Other (See Comments)    Torsade and cardiac arrest during dofetilide  initiation   Apresoline  [Hydralazine ] Other (See Comments)    Weakness  Sweats Skin redness   Hctz [Hydrochlorothiazide ] Other (See Comments)    Hyponatremia   Oxycontin  [Oxycodone ] Other (See Comments)    Unknown reaction   Amoxil  [Amoxicillin ] Diarrhea, Swelling and Rash    Rash to vaginal area, including swelling    Codeine Other (See Comments)    Hyperactivity    Conjugated Estrogens Itching and Rash   Erythromycin Rash    Reaction to E-mycin Has done ok with other medications in this class   Feldene [Piroxicam] Itching and Rash    Reaction to brand name Feldene   Social History   Socioeconomic History   Marital status: Married    Spouse name: Not on file   Number of children: 2   Years of education: Not on file   Highest education level: Bachelor's degree (e.g., BA, AB, BS)  Occupational History   Occupation: retired  Tobacco Use   Smoking status: Never    Passive exposure: Never   Smokeless tobacco: Never   Tobacco comments:    Never smoke 04/23/22  Vaping Use    Vaping status: Never Used  Substance and Sexual Activity   Alcohol use: Not Currently    Alcohol/week: 7.0 standard drinks of alcohol    Types: 7 Shots of liquor per week    Comment: none   Drug use: No   Sexual activity: Not Currently  Other Topics Concern   Not on file  Social History Narrative   Right handed    Lives with husband    Occasionally caffeine   retired   Chief Executive Officer Drivers of Corporate investment banker Strain: Low Risk  (03/15/2024)   Overall Financial Resource Strain (CARDIA)    Difficulty of Paying Living Expenses: Not hard at all  Food Insecurity: No Food Insecurity (03/15/2024)   Hunger Vital Sign    Worried About Running Out of Food in the Last Year: Never true    Ran Out of Food in the Last Year: Never true  Transportation Needs: No Transportation Needs (03/15/2024)   PRAPARE - Administrator, Civil Service (Medical): No    Lack of Transportation (Non-Medical): No  Physical Activity: Inactive (03/15/2024)   Exercise Vital Sign    Days of Exercise per Week: 0 days    Minutes of Exercise per Session: 0 min  Stress: Stress Concern Present (03/15/2024)   [employer] of Occupational Health - Occupational Stress Questionnaire    Feeling of Stress : Very much  Social Connections: Moderately Isolated (03/15/2024)   Social Connection and Isolation Panel    Frequency of Communication with Friends and Family: Three times a week    Frequency of Social Gatherings with Friends and Family: Once a week    Attends Religious Services: Never    Database administrator or Organizations: No    Attends Banker Meetings: Never    Marital Status: Married  Catering manager Violence: Not At Risk (08/13/2023)   Humiliation, Afraid, Rape, and Kick questionnaire    Fear of Current or Ex-Partner: No    Emotionally Abused: No    Physically Abused: No    Sexually Abused: No   ROS Objective  Awake and alert in no  distress. Right second toe: Positive open  wound with surrounding erythema and swelling Vitals as reported by the patient: Today's Vitals   06/22/24 1000  BP: 119/70  Height: 5' 4 (1.626 m)   Body mass index is 22.66 kg/m. Problem List Items Addressed This Visit       Other   Wound infection - Primary   Infected toe wound despite topical over-the-counter antibiotic Wound care instructions given Recommend to start oral antibiotic Patient has multiple allergies to antibiotics Allergic to penicillins and macrolides Doxycycline  100 mg twice a day for 7 days Advised to follow-up with PCP if no better or worse during the next several days.        I discussed the assessment and treatment plan with the patient. The patient was provided an opportunity to ask questions and all were answered. The patient agreed with the plan and demonstrated an understanding of the instructions.   The patient was advised to call back or seek an in-person evaluation if the symptoms worsen or if the condition fails to improve as anticipated.  I personally spent a total of 30 minutes in the care of the patient today including preparing to see the patient, getting/reviewing separately obtained history, performing a medically appropriate exam/evaluation, counseling and educating, placing orders, documenting clinical information in the EHR, and coordinating care.   Dr. Emil Schaumann, MD [facility] at [facility]

## 2024-06-22 NOTE — Assessment & Plan Note (Signed)
 Infected toe wound despite topical over-the-counter antibiotic Wound care instructions given Recommend to start oral antibiotic Patient has multiple allergies to antibiotics Allergic to penicillins and macrolides Doxycycline  100 mg twice a day for 7 days Advised to follow-up with PCP if no better or worse during the next several days.

## 2024-06-24 ENCOUNTER — Telehealth: Payer: Self-pay

## 2024-06-24 NOTE — Telephone Encounter (Signed)
 Copied from CRM [id number]. Topic: General - Other >> Jun 24, 2024 11:43 AM Jasmin G wrote: Reason for CRM: Pt's rep called regarding recent attempts to get Home care through Insurance and Medicaid through a company called [employer], attempts denied, pt's rep wanted to request for another attempt to be completed due to pt being in need of these service and also stated that hospice was recommended as well, please call pt's husband back at [phone] to discuss.

## 2024-06-24 NOTE — Telephone Encounter (Signed)
 I have fax over office visit notes summary to andrea with [employer] notes were sent successfully

## 2024-06-24 NOTE — Telephone Encounter (Signed)
 See latest encounter 06/24/2024 I have started an appeal on patient behalf

## 2024-06-24 NOTE — Telephone Encounter (Signed)
 I was on the phone with patient insurance company for 45 mins and 3 different departments to get a appeal on the two denials for home health care 35 hours a week. Insurance stated that they needed more information in regard to the patient health and I read to them patient summary visit notes word from word from here virtual visit with Dr Rollene on 06/18/2024 and the representative typed it as I spoke it as this was the reasoning for the appeal/ statement on patient health and mobility that. Authorization Code for the denial was [id number] and I asked that this appeal be expedited and towards the end of our call the representative informed me that once the appeal is complete if there is any more information that is needed on our behalf they will contact us  and let us  know.   Reference [id number]   [phone] Option 1 to check the status of this appeal   Care Management- [phone] option #6

## 2024-06-24 NOTE — Telephone Encounter (Signed)
 I have contacted Alfonso and left a very detailed message in regards to patient just having a tele visit with her primary Dr Rollene and I am going to fax over those notes to andrea fax number that was left in the referral note. Previously the insurance was wanting more information and at the time the last visit before 06/18/2024 was not enough for them as they had needed more detailed information and this is why pt caregiver amy and reached to Dr Rollene and I in regards to the extra detailed information that was needed. This is why the virtual appointment was scheduled so that provider could re--evaluate with the patient and and document more detailed information that was requested as to why patient needed the extended hours for home care.

## 2024-06-25 NOTE — Telephone Encounter (Unsigned)
 Copied from CRM [id number]. Topic: General - Other >> Jun 24, 2024 11:43 AM Jasmin G wrote: Reason for CRM: Pt's rep called regarding recent attempts to get Home care through Insurance and Medicaid through a company called [employer], attempts denied, pt's rep wanted to request for another attempt to be completed due to pt being in need of these service and also stated that hospice was recommended as well, please call pt's husband back at [phone] to discuss. >> Jun 24, 2024  4:47 PM Drema MATSU wrote: [facility] received an appeal request regarding home health  from [facility] (caller stated that that is the name that was entered, could be incorrect). She stated patient filed appeal on 08/11 and denial was upheld. She stated that it is being reviewed by their entity. They haven't recieved a decison from them yet.

## 2024-06-29 NOTE — Telephone Encounter (Signed)
 This same message was sent on 8/28 and I called the insurance company and requested and appeal and to expedite it as this is something patient is urgently needing. I am still waiting on a decisions

## 2024-06-29 NOTE — Telephone Encounter (Signed)
 Just an FYI I'm waiting on appeal answer from ms [employer]

## 2024-06-29 NOTE — Telephone Encounter (Signed)
 Called patient and updated him on where were we are at with the appeal and let him know that I am waiting on a appeal decision. I will call sometime this week to check on the status of this

## 2024-06-30 NOTE — Telephone Encounter (Signed)
 I was on the phone with patient insurance company for 42 mins and 50 seconds just to tell me that the appeal I had did over the phone verbally no decision has been made. There was suppose to be a decisions made with 72 hours as I called back on the 28th for this appeal to be expedited and there is still no decision made   Reference Number is- [id number] to check the status of the appeal  Phone number is - [phone]

## 2024-07-02 ENCOUNTER — Telehealth: Payer: Self-pay | Admitting: Radiology

## 2024-07-02 NOTE — Telephone Encounter (Signed)
 Copied from CRM #[id number]. Topic: General - Other >> Jul 02, 2024  8:48 AM Berneda FALCON wrote: Reason for CRM: John from [employer] expidiated appeal-there was no need for it- the decision has already been upheld. He states this is for therapy. This indicates that the decision for the therapy is denied.  [phone]

## 2024-07-02 NOTE — Telephone Encounter (Signed)
 I called patient back and spoke with the husband and informed them that the appeal was denied and will send a message to referrals to get an update for the hospice

## 2024-07-13 ENCOUNTER — Ambulatory Visit (HOSPITAL_COMMUNITY): Admitting: Psychiatry

## 2024-08-11 ENCOUNTER — Other Ambulatory Visit: Payer: Self-pay | Admitting: Thoracic Surgery (Cardiothoracic Vascular Surgery)

## 2024-08-11 DIAGNOSIS — R911 Solitary pulmonary nodule: Secondary | ICD-10-CM

## 2024-08-17 ENCOUNTER — Ambulatory Visit (HOSPITAL_COMMUNITY): Admitting: Psychiatry

## 2024-08-20 ENCOUNTER — Ambulatory Visit: Admitting: Physician Assistant

## 2024-09-07 ENCOUNTER — Ambulatory Visit (HOSPITAL_COMMUNITY)

## 2024-09-21 ENCOUNTER — Ambulatory Visit: Admitting: Thoracic Surgery (Cardiothoracic Vascular Surgery)

## 2024-09-27 DEATH — deceased

## 2024-11-05 ENCOUNTER — Other Ambulatory Visit: Payer: Self-pay | Admitting: Cardiology
